# Patient Record
Sex: Female | Born: 1955 | Race: White | Hispanic: No | Marital: Married
Health system: Midwestern US, Community
[De-identification: ages and names within clinical notes are randomized; demographics above are authoritative.]

## PROBLEM LIST (undated history)

## (undated) DIAGNOSIS — F339 Major depressive disorder, recurrent, unspecified: Secondary | ICD-10-CM

## (undated) DIAGNOSIS — G4700 Insomnia, unspecified: Secondary | ICD-10-CM

## (undated) DIAGNOSIS — F419 Anxiety disorder, unspecified: Secondary | ICD-10-CM

## (undated) DIAGNOSIS — D509 Iron deficiency anemia, unspecified: Secondary | ICD-10-CM

## (undated) DIAGNOSIS — G2581 Restless legs syndrome: Secondary | ICD-10-CM

## (undated) DIAGNOSIS — I82409 Acute embolism and thrombosis of unspecified deep veins of unspecified lower extremity: Secondary | ICD-10-CM

## (undated) DIAGNOSIS — M75100 Unspecified rotator cuff tear or rupture of unspecified shoulder, not specified as traumatic: Secondary | ICD-10-CM

## (undated) DIAGNOSIS — Z1231 Encounter for screening mammogram for malignant neoplasm of breast: Secondary | ICD-10-CM

## (undated) DIAGNOSIS — M545 Low back pain, unspecified: Secondary | ICD-10-CM

## (undated) DIAGNOSIS — E785 Hyperlipidemia, unspecified: Secondary | ICD-10-CM

## (undated) DIAGNOSIS — Z01419 Encounter for gynecological examination (general) (routine) without abnormal findings: Secondary | ICD-10-CM

## (undated) DIAGNOSIS — Z1239 Encounter for other screening for malignant neoplasm of breast: Secondary | ICD-10-CM

## (undated) DIAGNOSIS — M549 Dorsalgia, unspecified: Secondary | ICD-10-CM

## (undated) DIAGNOSIS — E569 Vitamin deficiency, unspecified: Secondary | ICD-10-CM

## (undated) DIAGNOSIS — Z9181 History of falling: Secondary | ICD-10-CM

## (undated) DIAGNOSIS — N958 Other specified menopausal and perimenopausal disorders: Secondary | ICD-10-CM

## (undated) DIAGNOSIS — M79671 Pain in right foot: Secondary | ICD-10-CM

## (undated) DIAGNOSIS — S22080K Wedge compression fracture of T11-T12 vertebra, subsequent encounter for fracture with nonunion: Secondary | ICD-10-CM

## (undated) DIAGNOSIS — Z981 Arthrodesis status: Secondary | ICD-10-CM

## (undated) DIAGNOSIS — R079 Chest pain, unspecified: Secondary | ICD-10-CM

## (undated) DIAGNOSIS — E7800 Pure hypercholesterolemia, unspecified: Secondary | ICD-10-CM

## (undated) DIAGNOSIS — I82402 Acute embolism and thrombosis of unspecified deep veins of left lower extremity: Secondary | ICD-10-CM

## (undated) DIAGNOSIS — G8929 Other chronic pain: Secondary | ICD-10-CM

## (undated) DIAGNOSIS — IMO0001 Reserved for inherently not codable concepts without codable children: Secondary | ICD-10-CM

## (undated) DIAGNOSIS — M79604 Pain in right leg: Secondary | ICD-10-CM

## (undated) DIAGNOSIS — K219 Gastro-esophageal reflux disease without esophagitis: Secondary | ICD-10-CM

## (undated) DIAGNOSIS — F413 Other mixed anxiety disorders: Secondary | ICD-10-CM

## (undated) DIAGNOSIS — M5441 Lumbago with sciatica, right side: Secondary | ICD-10-CM

## (undated) DIAGNOSIS — S42211A Unspecified displaced fracture of surgical neck of right humerus, initial encounter for closed fracture: Principal | ICD-10-CM

## (undated) DIAGNOSIS — S20211S Contusion of right front wall of thorax, sequela: Secondary | ICD-10-CM

## (undated) DIAGNOSIS — N6452 Nipple discharge: Secondary | ICD-10-CM

## (undated) DIAGNOSIS — F332 Major depressive disorder, recurrent severe without psychotic features: Principal | ICD-10-CM

## (undated) DIAGNOSIS — R0602 Shortness of breath: Secondary | ICD-10-CM

## (undated) DIAGNOSIS — D649 Anemia, unspecified: Principal | ICD-10-CM

## (undated) DIAGNOSIS — N390 Urinary tract infection, site not specified: Secondary | ICD-10-CM

## (undated) DIAGNOSIS — J40 Bronchitis, not specified as acute or chronic: Secondary | ICD-10-CM

## (undated) DIAGNOSIS — J9601 Acute respiratory failure with hypoxia: Principal | ICD-10-CM

## (undated) DIAGNOSIS — D508 Other iron deficiency anemias: Secondary | ICD-10-CM

---

## 2007-07-26 LAB — CBC WITH AUTOMATED DIFF
ABS. BASOPHILS: 0 10*3/uL (ref 0.0–0.1)
ABS. EOSINOPHILS: 0.2 10*3/uL (ref 0.0–0.4)
ABS. LYMPHOCYTES: 1.5 10*3/uL (ref 0.8–3.5)
ABS. MONOCYTES: 0.4 10*3/uL (ref 0–1.0)
ABS. NEUTROPHILS: 4.5 10*3/uL (ref 1.8–8.0)
BASOPHILS: 0 % (ref 0–1)
EOSINOPHILS: 3 % (ref 0–7)
HCT: 36.4 % (ref 35.0–47.0)
HGB: 11.7 g/dL (ref 11.5–16.0)
LYMPHOCYTES: 23 % (ref 12–49)
MCH: 29.3 PG (ref 26.0–34.0)
MCHC: 32.1 g/dL (ref 30.0–35.0)
MCV: 91 FL (ref 80.0–99.0)
MONOCYTES: 6 % (ref 5–13)
NEUTROPHILS: 68 % (ref 32–75)
PLATELET: 223 10*3/uL (ref 150–400)
RBC: 4 M/uL (ref 3.80–5.20)
RDW: 12.4 % (ref 11.5–14.5)
WBC: 6.6 10*3/uL (ref 3.6–11.0)

## 2007-07-26 LAB — METABOLIC PANEL, COMPREHENSIVE
A-G Ratio: 1.4 (ref 1.1–2.2)
ALT (SGPT): 27 U/L — ABNORMAL LOW (ref 30–65)
AST (SGOT): 17 U/L (ref 15–37)
Albumin: 4 g/dL (ref 3.5–5.0)
Alk. phosphatase: 97 U/L (ref 50–136)
Anion gap: 7 (ref 5–15)
BUN/Creatinine ratio: 15 (ref 12–20)
BUN: 9 MG/DL (ref 6–20)
Bilirubin, total: 0.2 MG/DL (ref 0–1.0)
CO2: 30 MMOL/L (ref 21–32)
Calcium: 8.9 MG/DL (ref 8.5–10.1)
Chloride: 107 MMOL/L (ref 97–108)
Creatinine: 0.6 MG/DL (ref 0.6–1.3)
GFR est AA: >60 mL/min/{1.73_m2} (ref >60)
GFR est non-AA: >60 mL/min/{1.73_m2} (ref >60)
Globulin: 2.8 g/dL (ref 2.0–4.0)
Glucose: 95 MG/DL (ref 65–105)
Potassium: 4.2 MMOL/L (ref 3.5–5.1)
Protein, total: 6.8 g/dL (ref 6.4–8.2)
Sodium: 144 MMOL/L (ref 136–145)

## 2007-07-26 LAB — LIPID PANEL
CHOL/HDL Ratio: 2.6 (ref 0–5.0)
Cholesterol, total: 206 MG/DL — ABNORMAL HIGH (ref <200)
HDL Cholesterol: 78 MG/DL — ABNORMAL HIGH (ref 40–60)
LDL, calculated: 109.6 MG/DL — ABNORMAL HIGH (ref 0–100)
Triglyceride: 92 MG/DL (ref 30–200)
VLDL, calculated: 18.4 MG/DL

## 2007-07-26 LAB — TSH 3RD GENERATION: TSH: 1.37 u[IU]/mL (ref 0.35–5.5)

## 2007-11-10 NOTE — Progress Notes (Signed)
Quick Note:    Please notify her of her negative cxr  ______

## 2007-11-10 NOTE — Progress Notes (Signed)
HISTORY OF PRESENT ILLNESS  Lindsay [name] is a 52 y.o. female.  HPI    Seen for a constellation of sxs. Notes she was using a tractor and felt a right chest wall pulliing sensation 4/18th-developed a focal sore area in right anterior chest and went to [facility] on 4/23 where an ekg and cxr were negative and she was told she had a muscle pull. The discomfort persists although to a lesser degree but now she feels pain on her right neck and upper back and wonders if the fact that she stopped going to PT has made this worse. She was getting heat treatment to upper back during pt. Also notes she stopped her lexapro 3 days ago and feels funny in her head now and has more diffuse aches and pains. No fevers, on visual sxs and no HA.        REVIEW OF SYSTEMS  Review of Systems   Constitutional: Negative for fever and chills.   HENT: Positive for neck pain.    Eyes: Negative for blurred vision and double vision.   Respiratory: Negative for shortness of breath.    Cardiovascular: Positive for chest pain. Negative for palpitations and leg swelling.   Musculoskeletal: Positive for myalgias and back pain. Negative for joint pain.   Skin: Negative for rash.   Neurological: Negative for dizziness, tremors, sensory change, focal weakness and headaches.       PHYSICAL EXAM  Physical Exam   Nursing note and vitals reviewed.  Constitutional: She is oriented. She appears well-developed and well-nourished.   HENT:   Head: Normocephalic and atraumatic.   Neck: Normal range of motion. Neck supple. Carotid bruit is not present. No thyromegaly present.   Cardiovascular: Normal rate, regular rhythm, S1 normal, S2 normal, normal heart sounds and intact distal pulses.    No murmur heard.  Pulmonary/Chest: Effort normal and breath sounds normal. No respiratory distress. She has no wheezes. She has no rales.   Genitourinary:        Breast exam s/p implants,no lumps or areas to suggest rupture of the implant    Musculoskeletal: She exhibits no edema.        Tender over right trapezius muscle with palpable spasm  Mildly tender over right anterior chest wall with mild focal soft tissue swelling around the size of a 1/2 dollar.   Neurological: She is alert and oriented.   Psychiatric: She has a normal mood and affect. Her behavior is normal.       ASSESSMENT and PLAN  Lindsay [name] was seen today for chest pain, leg pain, neck pain and headache.    Diagnoses and associated orders for this visit:    - Chest pain-consistent with chest wall pain-advil q 6 hours for 1 week  - Xr chest pa and lateral -- Future-to r/o any bony lesions of ribs    - Trapezius strain    -Informed consent obtained and under sterile conditions right trapezius muscle was prepped and draped and injected with 1 cc of depomedrol and lidocaine in 2 separate trigger points (1/2 cc in each).  Tolerated well without complications.  Patient instructed to keep area clean and return if any signs of infection develop.  Suspect serotonin discontinuation-resume 1/2 a lexapro qd  Appt in 3 weeks if not better      -

## 2007-11-13 NOTE — Telephone Encounter (Signed)
Patient notified by phone cxr looked fine.

## 2007-11-13 NOTE — Telephone Encounter (Signed)
Message copied by Edgardo Roys on Mon Nov 13, 2007 9:31 AM  ------   Message from: Clarene Critchley   Created: Fri Nov 10, 2007 4:20 PM     Please notify her of her negative cxr

## 2007-12-27 ENCOUNTER — Ambulatory Visit

## 2007-12-27 LAB — CBC WITH AUTOMATED DIFF
ABS. BASOPHILS: 0 10*3/uL (ref 0.0–0.1)
ABS. EOSINOPHILS: 0.1 10*3/uL (ref 0.0–0.4)
ABS. LYMPHOCYTES: 1.6 10*3/uL (ref 0.8–3.5)
ABS. MONOCYTES: 0.4 10*3/uL (ref 0–1.0)
ABS. NEUTROPHILS: 3.1 10*3/uL (ref 1.8–8.0)
BASOPHILS: 0 % (ref 0–1)
EOSINOPHILS: 1 % (ref 0–7)
HCT: 37.1 % (ref 35.0–47.0)
HGB: 12.1 g/dL (ref 11.5–16.0)
LYMPHOCYTES: 31 % (ref 12–49)
MCH: 30.2 PG (ref 26.0–34.0)
MCHC: 32.6 g/dL (ref 30.0–35.0)
MCV: 92.5 FL (ref 80.0–99.0)
MONOCYTES: 7 % (ref 5–13)
NEUTROPHILS: 61 % (ref 32–75)
PLATELET: 225 10*3/uL (ref 150–400)
RBC: 4.01 M/uL (ref 3.80–5.20)
RDW: 13.2 % (ref 11.5–14.5)
WBC: 5.2 10*3/uL (ref 3.6–11.0)

## 2007-12-27 LAB — TSH 3RD GENERATION: TSH: 0.99 u[IU]/mL (ref 0.35–5.5)

## 2007-12-27 NOTE — Progress Notes (Signed)
HISTORY OF PRESENT ILLNESS  Lindsay [name] is a 52 y.o. female.  HPI       Notes chest pain did improve  But notes some pain on the right side of head and some headache and ear and jaw discomfort . She denies fevers or chills. She is on 1 lexapro 10 mg qd and notes some fatigue. Feels she sleeps well. Using metamucil since the fall for constipation and notes in last few months some discomfort in rectal area and using topical cortisone cream. Sees some blood in stool recently. No recent gi eval. Notes fatigue.    Review of Systems   Constitutional: Negative for fever, chills and weight loss.   HENT: Positive for neck pain. Negative for ear pain and nosebleeds.    Respiratory: Negative for cough, shortness of breath and wheezing.    Cardiovascular: Negative for chest pain, palpitations, orthopnea, leg swelling and PND.   Gastrointestinal: Positive for constipation and blood in stool. Negative for heartburn, nausea and diarrhea.   Musculoskeletal: Negative for myalgias.   Neurological: Positive for headaches. Negative for dizziness.       Physical Exam   Nursing note and vitals reviewed.  Constitutional: She is oriented. She appears well-developed and well-nourished.   HENT:   Head: Normocephalic and atraumatic.        Mildly tender in tmj area   Neck: Normal range of motion. Neck supple. Carotid bruit is not present. No thyromegaly present.   Cardiovascular: Normal rate, regular rhythm, S1 normal, S2 normal, normal heart sounds and intact distal pulses.    No murmur heard.  Pulmonary/Chest: Effort normal and breath sounds normal. No respiratory distress. She has no wheezes. She has no rales.   Musculoskeletal: She exhibits no edema.   Neurological: She is alert and oriented.   Psychiatric: She has a normal mood and affect. Her behavior is normal.       ASSESSMENT and PLAN  Lindsay [name] was seen today for depression.    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

## 2007-12-30 NOTE — Progress Notes (Signed)
Quick Note:    Notified by letter.    ______

## 2008-01-17 NOTE — Telephone Encounter (Signed)
Lab slip completed and placed up front

## 2008-01-17 NOTE — Telephone Encounter (Signed)
Needs a lab order for her chemistry panel will pickup Thursday am 01/18/08.She received a letter from [facility] to have this test done

## 2008-01-18 LAB — METABOLIC PANEL, COMPREHENSIVE
A-G Ratio: 1.5 (ref 1.1–2.2)
ALT (SGPT): 29 U/L — ABNORMAL LOW (ref 30–65)
AST (SGOT): 17 U/L (ref 15–37)
Albumin: 4.1 g/dL (ref 3.5–5.0)
Alk. phosphatase: 85 U/L (ref 50–136)
Anion gap: 8 mmol/L (ref 5–15)
BUN/Creatinine ratio: 22 — ABNORMAL HIGH (ref 12–20)
BUN: 13 MG/DL (ref 6–20)
Bilirubin, total: 0.4 MG/DL (ref <1.0)
CO2: 31 MMOL/L (ref 21–32)
Calcium: 9.2 MG/DL (ref 8.5–10.1)
Chloride: 102 MMOL/L (ref 97–108)
Creatinine: 0.6 MG/DL (ref 0.6–1.3)
GFR est AA: >60 mL/min/{1.73_m2} (ref >60)
GFR est non-AA: >60 mL/min/{1.73_m2} (ref >60)
Globulin: 2.8 g/dL (ref 2.0–4.0)
Glucose: 87 MG/DL (ref 50–100)
Potassium: 4.3 MMOL/L (ref 3.5–5.1)
Protein, total: 6.9 g/dL (ref 6.4–8.2)
Sodium: 141 MMOL/L (ref 136–145)

## 2008-01-18 LAB — TSH 3RD GENERATION: TSH: 1.4 u[IU]/mL (ref 0.35–5.5)

## 2008-01-19 NOTE — Progress Notes (Signed)
Quick Note:    Notified by letter.    ______

## 2008-03-18 MED ORDER — DIAZEPAM 5 MG TAB
5 mg | ORAL_TABLET | Freq: Two times a day (BID) | ORAL | Status: DC
Start: 2008-03-18 — End: 2008-09-13

## 2008-09-13 NOTE — Telephone Encounter (Signed)
Advised needs appt.

## 2008-09-16 MED ORDER — DIAZEPAM 5 MG TAB
5 mg | ORAL_TABLET | Freq: Two times a day (BID) | ORAL | Status: DC
Start: 2008-09-16 — End: 2008-09-26

## 2008-09-16 MED ORDER — DIAZEPAM 5 MG TAB
5 mg | ORAL_TABLET | Freq: Two times a day (BID) | ORAL | Status: DC
Start: 2008-09-16 — End: 2008-09-16

## 2008-09-16 NOTE — Telephone Encounter (Addendum)
Addended by: Orva Gwaltney L on: 09/16/2008      Modules accepted: Orders

## 2008-09-16 NOTE — Telephone Encounter (Addendum)
Can you call this in for her -thanks

## 2008-09-17 NOTE — Telephone Encounter (Signed)
This was already done

## 2008-09-26 MED ORDER — DIAZEPAM 5 MG TAB
5 mg | ORAL_TABLET | Freq: Two times a day (BID) | ORAL | Status: AC
Start: 2008-09-26 — End: 2008-10-26

## 2008-09-26 NOTE — Progress Notes (Signed)
HISTORY OF PRESENT ILLNESS  Lindsay [name] is a 53 y.o. female.  HPI  Lindsay [name] seen in six months follow up.    Issues:    1. Anxiety.  She has been working with her psychiatrist and is on Lexapro.  He suggested she bump from 10 to 15 mg dose but she was overly sedated with it.  She feels that although less anxious she is not as energized and not as motivated as when she was off of it, wants my opinion.  2. Left foot injury.  Larey Seat off a ladder, has a 2 cm tear in a tendon in the bottom of her foot.  Is working with Dr. Sharlet Salina from [facility] in a boot but anticipating surgery and will have pre-op EKG tomorrow.  3. Weight gain.  Twenty-five pounds in the last year and a half. She is working with a nutritionist who has been encouraging her to eat more regularly, previously skipped meals.    4. Valium which I have been filling, she has been using a total of a 5 mg tab daily although cuts them in half and doses bid and has been very stable with this.  Has never escalated usage.        Review of Systems   Constitutional: Negative for fever, chills and weight loss.   Respiratory: Negative for cough, shortness of breath and wheezing.    Cardiovascular: Negative for chest pain, palpitations, orthopnea, leg swelling and PND.   Gastrointestinal: Negative for heartburn and nausea.   Musculoskeletal: Positive for joint pain (foot) and falls. Negative for myalgias.   Neurological: Negative for dizziness and headaches.   Psychiatric/Behavioral: Negative for depression. The patient is not nervous/anxious and does not have insomnia.        Physical Exam   Nursing note and vitals reviewed.  Constitutional: She is oriented. She appears well-developed and well-nourished.   HENT:   Head: Normocephalic and atraumatic.   Neck: Normal range of motion. Neck supple. Carotid bruit is not present. No thyromegaly present.    Cardiovascular: Normal rate, regular rhythm, S1 normal, S2 normal, normal heart sounds and intact distal pulses.    No murmur heard.  Pulmonary/Chest: Effort normal and breath sounds normal. No respiratory distress. She has no wheezes. She has no rales.   Musculoskeletal: She exhibits no edema.   Neurological: She is alert and oriented.   Psychiatric: She has a normal mood and affect. Her behavior is normal.       ASSESSMENT and PLAN  Lindsay [name] was seen today for follow-up, depression and other.    Diagnoses and associated orders for this visit:    Foot injury-planning surgery    Anxiety-she will review a decrease in lexapro dose and possibly adding wellbutrin with her psychiatrist  - diazepam (VALIUM) tablet; Take 1 Tab by mouth two (2) times a day for 30 days. 1/2 bid    Weight gain-reviewed dietary measures    Side effects possible reviewed in detail

## 2008-10-09 NOTE — Telephone Encounter (Signed)
Patient advised DR. Darl Pikes not here, would need to see partner for med's or try calling foot surgeon, she tried calling them they want her to get med advice from PCP, patient will continue bid valium 1/2 tab for now if it gets worse she will call back for appt no hx on chart for restless leg, patient just thinks it's from being cooped up in the house and not being able to get up.

## 2008-10-09 NOTE — Telephone Encounter (Signed)
Pt had foot surgery on 29th and has to stay off for another week or more  is taking 5mg  diazapam  She is going crazy with something like restless leg and needs advice on how to cope  increase diazapam or something else dr would suggest call [phone]

## 2008-10-31 LAB — METABOLIC PANEL, COMPREHENSIVE
A-G Ratio: 1 — ABNORMAL LOW (ref 1.1–2.2)
ALT (SGPT): 40 U/L (ref 12–78)
AST (SGOT): 23 U/L (ref 15–37)
Albumin: 4.1 g/dL (ref 3.5–5.0)
Alk. phosphatase: 158 U/L — ABNORMAL HIGH (ref 50–136)
Anion gap: 8 mmol/L (ref 5–15)
BUN/Creatinine ratio: 14 (ref 12–20)
BUN: 10 MG/DL (ref 6–20)
Bilirubin, total: 0.3 MG/DL (ref 0.2–1.0)
CO2: 31 MMOL/L (ref 21–32)
Calcium: 10.2 MG/DL — ABNORMAL HIGH (ref 8.5–10.1)
Chloride: 98 MMOL/L (ref 97–108)
Creatinine: 0.7 MG/DL (ref 0.6–1.3)
GFR est AA: >60 mL/min/{1.73_m2} (ref >60)
GFR est non-AA: >60 mL/min/{1.73_m2} (ref >60)
Globulin: 4.1 g/dL — ABNORMAL HIGH (ref 2.0–4.0)
Glucose: 99 MG/DL (ref 65–100)
Potassium: 4 MMOL/L (ref 3.5–5.1)
Protein, total: 8.2 g/dL (ref 6.4–8.2)
Sodium: 137 MMOL/L (ref 136–145)

## 2008-10-31 LAB — CK W/ REFLX CKMB: CK: 76 U/L (ref 21–215)

## 2008-10-31 LAB — CBC WITH AUTOMATED DIFF
ABS. BASOPHILS: 0 10*3/uL (ref 0.0–0.1)
ABS. EOSINOPHILS: 0.2 10*3/uL (ref 0.0–0.4)
ABS. LYMPHOCYTES: 1.4 10*3/uL (ref 0.8–3.5)
ABS. MONOCYTES: 0.5 10*3/uL (ref 0.0–1.0)
ABS. NEUTROPHILS: 5.1 10*3/uL (ref 1.8–8.0)
BASOPHILS: 0 % (ref 0–1)
EOSINOPHILS: 3 % (ref 0–7)
HCT: 38.8 % (ref 35.0–47.0)
HGB: 12.9 g/dL (ref 11.5–16.0)
LYMPHOCYTES: 19 % (ref 12–49)
MCH: 28.2 PG (ref 26.0–34.0)
MCHC: 33.2 g/dL (ref 30.0–36.5)
MCV: 84.9 FL (ref 80.0–99.0)
MONOCYTES: 7 % (ref 5–13)
NEUTROPHILS: 71 % (ref 32–75)
PLATELET: 205 10*3/uL (ref 150–400)
RBC: 4.57 M/uL (ref 3.80–5.20)
RDW: 13.2 % (ref 11.5–14.5)
WBC: 7.3 10*3/uL (ref 3.6–11.0)

## 2008-10-31 LAB — NT-PRO BNP: NT pro-BNP: 35 PG/ML (ref 0–125)

## 2008-10-31 LAB — TROPONIN I: Troponin-I, Qt.: <0.04 ng/mL (ref <0.05)

## 2008-10-31 MED ORDER — ALBUTEROL 90 MCG/ACTUATION AEROSOL INHALER
90 mcg/actuation | RESPIRATORY_TRACT | Status: DC | PRN
Start: 2008-10-31 — End: 2009-05-21

## 2008-10-31 MED ADMIN — ioversol (OPTIRAY) 350 mg/mL contrast solution 100 mL: INTRAVENOUS | @ 20:00:00 | NDC 00019133311

## 2008-10-31 MED ADMIN — 0.9% sodium chloride infusion: INTRAVENOUS | @ 21:00:00 | NDC 00409798309

## 2008-10-31 MED FILL — SALINE FLUSH INJECTION SYRINGE: INTRAMUSCULAR | Qty: 10

## 2008-10-31 MED FILL — SODIUM CHLORIDE 0.9 % IV: INTRAVENOUS | Qty: 1000

## 2008-10-31 MED FILL — OPTIRAY 350 MG IODINE/ML INTRAVENOUS SOLUTION: 350 mg iodine/mL | INTRAVENOUS | Qty: 100

## 2008-10-31 NOTE — ED Notes (Signed)
I have reviewed discharge instructions with the patient.  The patient verbalized understanding. Pt d/c with husband pt using her crutches .

## 2008-10-31 NOTE — ED Notes (Signed)
Negative CTA for PE. Will D/C.

## 2008-10-31 NOTE — ED Provider Notes (Signed)
Patient is a 53 y.o. female presenting with chest pain. The history is provided by the patient.   Chest Pain (Angina)   This is a new problem. The problem has not changed since onset. The pain is associated with coughing. Pain location: mid chest.  The pain is mild. The quality of the pain is described as sharp and stabbing ("Funny feeling"). The pain does not radiate. Associated symptoms include cough. Pertinent negatives include no diaphoresis, no fever, no palpitations, no nausea, no vomiting, no back pain, no dizziness and no weakness. She has tried nothing for the symptoms. Risk factors include no risk factors. Her past medical history does not include aneurysm, DM, DVT, HTN or PE.      She had surgery to repair a tendon on her L foot 30 days ago. She has been laying flat on her back, resting since the surgery. Earlier in the week she began with a cough and cold sxs and when the chest pain began several days ago, she assumed it was related to recent cold. Her chest pain is worsened with cough.     She notes that her cough produces phlegm but no blood.   She was seen at [facility] just [facility] and sent to the ER for further evaluation via CT to r/o PE.  She arrives to the ED with EKG and Chest xray from [facility]. She reports that lab work was done but did not bring paperwork with her. Pt notes that when she left [facility] to come to the ED, she felt as if her heart were racing. She no longer has this sensation.    Patient is otherwise healthy  Exposure to illness includes her son and daughter who had what she initially thought were seasonal allergies.  No other complaints at this time.       No hx of DVT.    No past medical history on file.     Past Surgical History   Procedure Date   ??? Hx other surgical      left foot            No family history on file.     History   Social History   ??? Marital Status: Married     Spouse Name: N/A     Number of Children: N/A   ??? Years of Education: N/A    Occupational History   ??? Not on file.   Social History Main Topics   ??? Tobacco Use: Never   ??? Alcohol Use: No   ??? Drug Use: No   ??? Sexually Active: Yes -- Female partner(s)   Other Topics Concern   ??? Not on file   Social History Narrative   ??? No narrative on file           ALLERGIES: Review of patient's allergies indicates no known allergies.      Review of Systems   Constitutional: Negative for fever, chills, diaphoresis and fatigue.   Respiratory: Positive for cough.    Cardiovascular: Positive for chest pain. Negative for palpitations and leg swelling.   Gastrointestinal: Negative.  Negative for nausea and vomiting.   Genitourinary: Negative.    Musculoskeletal: Negative for back pain and joint swelling.   Neurological: Negative for dizziness, syncope and weakness.       Filed Vitals:    10/31/2008  2:45 PM   BP: 129/73   Pulse: 103   Temp: 98.6 ??F (37 ??C)   Resp: 18  Height: 5\' 3"  (1.6 m)   Weight: 150 lb (68.04 kg)   SpO2: 96%              Physical Exam   Constitutional: She is oriented. She appears well-developed and well-nourished. No distress.   HENT:   Head: Normocephalic and atraumatic.   Mouth/Throat: Oropharynx is clear and moist.   Eyes: Conjunctivae and extraocular motions are normal. Pupils are equal, round, and reactive to light. No scleral icterus.   Neck: Normal range of motion. Neck supple.   Cardiovascular: Normal rate and regular rhythm.  Exam reveals no gallop and no friction rub.    No murmur heard.  Pulmonary/Chest: Effort normal. No stridor. No respiratory distress. She has no wheezes. She has no rales.   Abdominal: Soft. Bowel sounds are normal. She exhibits no distension and no mass. No tenderness. She has no rebound and no guarding.   Musculoskeletal: Normal range of motion. She exhibits no edema and no tenderness.        No calf tenderness.    Lymphadenopathy:     She has no cervical adenopathy.   Neurological: She is alert and oriented. No cranial nerve deficit. Coordination normal.    Skin: Skin is warm and dry. No rash noted. No erythema.        Surgical dressing over L foot.    Psychiatric: She has a normal mood and affect.            Coding      Procedures

## 2008-10-31 NOTE — ED Notes (Signed)
Pt to ct

## 2008-11-01 LAB — EKG, 12 LEAD, INITIAL
Atrial Rate: 79 {beats}/min
Calculated P Axis: 47 degrees
Calculated R Axis: 61 degrees
Calculated T Axis: 58 degrees
Diagnosis: NORMAL
P-R Interval: 138 ms
Q-T Interval: 386 ms
QRS Duration: 84 ms
QTC Calculation (Bezet): 442 ms
Ventricular Rate: 79 {beats}/min

## 2009-04-01 MED ORDER — DIAZEPAM 10 MG TAB
10 mg | ORAL_TABLET | Freq: Four times a day (QID) | ORAL | Status: DC | PRN
Start: 2009-04-01 — End: 2009-05-21

## 2009-04-01 NOTE — Progress Notes (Signed)
HISTORY OF PRESENT ILLNESS  Lindsay [name] is a 53 y.o. female.  HPI  Lindsay [name] comes in at follow up.  Issues:    1. Depression.  She has been working with Dr. Salley [name] because of fatigue on Lexapro.  She has tried Pristiq 50 mg daily for the last month.  She does not like it, feels more depressed and is thinking she will come off of it but plans to try one more month.  Also has gone to see a psychologist for evaluation of possible ADHD, as her daughter has this diagnosis.  2. Rectal bleeding with known hemorrhoids.  Dr. Antonieta [name] has treated her with creams.  This has improved.  3. Abnormal Pap smear.  Being followed by Dr. Burnett [name], now with every six-month follow up Pap.  Most recent has been normal.  She continues to use Valium 10 mg half tab bid and has been stable on this dose, and I have continued to refill it for her.  She is also due for full labs.      MedDATA/lmm        Review of Systems   Constitutional: Negative for fever, chills and weight loss.   Respiratory: Negative for cough, shortness of breath and wheezing.    Cardiovascular: Negative for chest pain, palpitations, orthopnea, leg swelling and PND.   Gastrointestinal: Positive for blood in stool. Negative for heartburn and nausea.   Musculoskeletal: Negative for myalgias.   Neurological: Negative for dizziness and headaches.   Psychiatric/Behavioral: Positive for depression. The patient is nervous/anxious. The patient does not have insomnia.        Physical Exam   Nursing note and vitals reviewed.  Constitutional: She is oriented to person, place, and time. She appears well-developed and well-nourished.   HENT:   Head: Normocephalic and atraumatic.   Neck: Normal range of motion. Neck supple. Carotid bruit is not present. No thyromegaly present.   Cardiovascular: Normal rate, regular rhythm, S1 normal, S2 normal, normal heart sounds and intact distal pulses.    No murmur heard.   Pulmonary/Chest: Effort normal and breath sounds normal. No respiratory distress. She has no wheezes. She has no rales.   Musculoskeletal: She exhibits no edema.   Neurological: She is alert and oriented to person, place, and time.   Psychiatric: She has a normal mood and affect. Her behavior is normal.       ASSESSMENT and PLAN  Lindsay [name] was seen today for follow-up and anxiety.    Diagnoses and associated orders for this visit:    Hypercholesterolemia  - LIPID PANEL; Future  - METABOLIC PANEL, COMPREHENSIVE; Future    Fatigue  - CBC WITH AUTOMATED DIFF; Future  - TSH, 3RD GENERATION; Future    Depression-cont to see Dr Lindsay [name] who is working with her and currently using pristiq-may go back to lexapro  appt here in 6 mo sooner prn  - diazepam (VALIUM) 10 mg tablet; Take 0.5 Tabs by mouth every six (6) hours as needed.    Other Orders  - Desvenlafaxine SR (PRISTIQ) 50 mg tablet; Take 50 mg by mouth daily.

## 2009-04-02 ENCOUNTER — Ambulatory Visit

## 2009-04-02 LAB — LIPID PANEL
CHOL/HDL Ratio: 3.1 (ref 0–5.0)
Cholesterol, total: 232 MG/DL — ABNORMAL HIGH (ref <200)
HDL Cholesterol: 75 MG/DL
LDL, calculated: 112.6 MG/DL — ABNORMAL HIGH (ref 0–100)
Triglyceride: 222 MG/DL — ABNORMAL HIGH (ref <150)
VLDL, calculated: 44.4 MG/DL

## 2009-04-02 LAB — METABOLIC PANEL, COMPREHENSIVE
A-G Ratio: 1.3 (ref 1.1–2.2)
ALT (SGPT): 17 U/L (ref 12–78)
AST (SGOT): 14 U/L — ABNORMAL LOW (ref 15–37)
Albumin: 4.2 g/dL (ref 3.5–5.0)
Alk. phosphatase: 129 U/L (ref 50–136)
Anion gap: 11 mmol/L (ref 5–15)
BUN/Creatinine ratio: 19 (ref 12–20)
BUN: 15 MG/DL (ref 6–20)
Bilirubin, total: 0.3 MG/DL (ref 0.2–1.0)
CO2: 26 MMOL/L (ref 21–32)
Calcium: 9.3 MG/DL (ref 8.5–10.1)
Chloride: 102 MMOL/L (ref 97–108)
Creatinine: 0.8 MG/DL (ref 0.6–1.3)
GFR est AA: >60 mL/min/{1.73_m2} (ref >60)
GFR est non-AA: >60 mL/min/{1.73_m2} (ref >60)
Globulin: 3.3 g/dL (ref 2.0–4.0)
Glucose: 83 MG/DL (ref 65–100)
Potassium: 4 MMOL/L (ref 3.5–5.1)
Protein, total: 7.5 g/dL (ref 6.4–8.2)
Sodium: 139 MMOL/L (ref 136–145)

## 2009-04-02 LAB — CBC WITH AUTOMATED DIFF
ABS. BASOPHILS: 0 10*3/uL (ref 0.0–0.1)
ABS. EOSINOPHILS: 0.1 10*3/uL (ref 0.0–0.4)
ABS. LYMPHOCYTES: 2.2 10*3/uL (ref 0.8–3.5)
ABS. MONOCYTES: 0.4 10*3/uL (ref 0.0–1.0)
ABS. NEUTROPHILS: 3.8 10*3/uL (ref 1.8–8.0)
BASOPHILS: 0 % (ref 0–1)
EOSINOPHILS: 2 % (ref 0–7)
HCT: 39.7 % (ref 35.0–47.0)
HGB: 12.4 g/dL (ref 11.5–16.0)
LYMPHOCYTES: 34 % (ref 12–49)
MCH: 27.4 PG (ref 26.0–34.0)
MCHC: 31.2 g/dL (ref 30.0–36.5)
MCV: 87.8 FL (ref 80.0–99.0)
MONOCYTES: 7 % (ref 5–13)
NEUTROPHILS: 57 % (ref 32–75)
PLATELET: 264 10*3/uL (ref 150–400)
RBC: 4.52 M/uL (ref 3.80–5.20)
RDW: 14.1 % (ref 11.5–14.5)
WBC: 6.5 10*3/uL (ref 3.6–11.0)

## 2009-04-02 LAB — TSH 3RD GENERATION: TSH: 1.65 u[IU]/mL (ref 0.36–3.74)

## 2009-04-02 NOTE — Progress Notes (Signed)
Quick Note:    Notified by letter.    ______

## 2009-04-18 MED ORDER — DIAZEPAM 5 MG TAB
5 mg | ORAL_TABLET | ORAL | Status: DC
Start: 2009-04-18 — End: 2010-03-26

## 2009-05-21 NOTE — Telephone Encounter (Signed)
Notes right jaw claudication and pain in temporal area started 4 days ago-[facility] Dr Melvyn Neth eye exam-advised I will work her in here today

## 2009-05-21 NOTE — Progress Notes (Signed)
Faxed MR request to [facility] for visit on 05/21/2009.

## 2009-05-21 NOTE — Progress Notes (Signed)
HISTORY OF PRESENT ILLNESS  Lindsay [name] is a 53 y.o. female.  HPI  Sat developed pain in right side of jaw and extended to ear and neck and Sunday developed headache and felt terrible. Monday  She started vyvanse from her psychiatrist and that night felt exhausted and persistent pain and some pain with eating. Went to [facility] on Tuesday and had a sed rate of 20 and because of some right eye pain she was sent to Dr Lenon Ahmadi who today did an eye exam and did not find any problems but is worried re Temporal arteritis and asked her to come see me for further exam.She had a dose of 60 mg of prednisone last night and does have a rx from [facility] for prednisone 20 mg 3 tablets daily but has not filled it yet.  Review of Systems   Constitutional: Negative for fever, chills and diaphoresis.   HENT: Negative for congestion.    Eyes: Positive for pain (right eye). Negative for blurred vision, double vision, discharge and redness.   Neurological: Positive for headaches.       Physical Exam   Nursing note and vitals reviewed.  Constitutional: She appears well-developed and well-nourished.   HENT:   Head: Normocephalic.   Right Ear: Tympanic membrane, external ear and ear canal normal.   Left Ear: Tympanic membrane, external ear and ear canal normal.   Nose: Nose normal.   Mouth/Throat: Oropharynx is clear and moist and mucous membranes are normal. No oropharyngeal exudate.        No pain in temporal area but tender at tmj region and extending down to angle of jaw and pain with opening jaw   Eyes: Conjunctivae are normal. Pupils are equal, round, and reactive to light. Right eye exhibits no discharge. Left eye exhibits no discharge.        Pupils dilated bilaterally-recent eye exam with Dr Lenon Ahmadi   Neck: Normal range of motion. Neck supple.   Cardiovascular: Normal rate, regular rhythm and normal heart sounds.     Pulmonary/Chest: Effort normal and breath sounds normal. No respiratory distress. She has no wheezes. She has no rales.   Lymphadenopathy:     She has no cervical adenopathy.       ASSESSMENT and PLAN  Zonia was seen today for jaw pain.    Diagnoses and associated orders for this visit:    Jaw claudication-? Of temporal arteritis has been raised despite normal esr she has sxs that prompted start of prednisone 60 mg every day-spoke with Dr Sheliah Hatch vascular who kindly agrees to see her for a temporal artery biopsy -she will cont the prednisone until this is obtained-reviewed side effects of the med-reviewed risks of not treating temporal arteritis in terms of loss of vision-she understands need to further define the diagnosis-this may all be from tmj area.  Other Orders  - Lisdexamfetamine (VYVANSE) 30 mg capsule; Take 30 mg by mouth every morning.  Over 50% of  Visit of 30 minutes spent reviewing diagnosis and treatment options and side effects of medicines.

## 2009-05-24 NOTE — Op Note (Signed)
Op  Notes filed by Adah Salvage, MD at 06/12/09 1006                Author: Adah Salvage, MD  Service: --  Author Type: Physician       Filed: 06/12/09 1006  Date of Service: 05/24/09 0024  Status: Addendum          Editor: Adah Salvage, MD (Physician)          <!--EPICS--> Name:      Lindsay [name], Lindsay [name]<BR> MR #:      [id number]                    Surgeon:        Alvina Filbert, MD<BR> Account #: [id number]                  Surgery Date:   05/23/2009<BR> DOB:       1955/11/04<BR> Age:       53                           Location:<BR> <BR>                              OPERATIVE REPORT<BR> <BR> <BR> PREOPERATIVE DIAGNOSIS<BR> <BR> REASON FOR PROCEDURE:  Right  temporal arteritis.<BR> <BR> POSTOPERATIVE DIAGNOSIS:  Right temporal arteritis.<BR> <BR> OPERATIVE PROCEDURE:  Temporal artery biopsy.<BR> <BR> SURGEON:  Vernia Buff T. Sheliah Hatch, MD<BR> <BR> ANESTHESIA:  IV sedation, local anesthesia.<BR> <BR> COMPLICATIONS<BR>  <BR> ESTIMATED BLOOD LOSS<BR> <BR> INDICATIONS FOR PROCEDURE:  The patient is [name] with<BR> temporal arteritis on the right side. Decision was made to perform [name]<BR> biopsy.<BR> <BR> PROCEDURE<BR> TECHNICAL DETAILS:  She was taken to the  operating room and [name] suitable<BR> level of anesthesia was induced. She was prepped and draped in typical<BR> sterile fashion. Oblique incision was made in the right temple, dissection<BR> carried out down the artery ________ soft tissue attachments proximally  and<BR> distally. The artery was controlled proximally and distally _______ sent to<BR> pathology for permanent section.<BR> <BR> Hemostasis was controlled. The wound was closed in multiple layers.<BR> Tolerated the procedure well. Sponge and instrument  counts were correct x2.<BR> <BR> <BR> She was awakened and transferred to recovery room in good condition.<BR> <BR> <BR> <BR> Reviewed on 05/26/2009 8:30 AM<BR> <BR> <BR> E-Signed By<BR> Alvina Filbert, MD 06/12/2009 10:06<BR> Alvina Filbert, MD<BR> <BR>  cc:    Alvina Filbert, MD<BR> <BR> <BR> <BR> MW/WMX; D: 05/23/2009  5:01 P; T: 05/24/2009 12:24 [name]; Doc# [id number]; Job#<BR> [id number]<UY> <!--EPICE-->

## 2009-05-24 NOTE — Op Note (Addendum)
Name: Lindsay [name], Lindsay [name]  MR #: [id number] Surgeon: Alvina Filbert, MD  Account #: [id number] Surgery Date: 05/23/2009  DOB: Apr 15, 1956  Age: 53 Location:     OPERATIVE REPORT      PREOPERATIVE DIAGNOSIS    REASON FOR PROCEDURE: Right temporal arteritis.    POSTOPERATIVE DIAGNOSIS: Right temporal arteritis.    OPERATIVE PROCEDURE: Temporal artery biopsy.    SURGEON: Vernia Buff T. Sheliah Hatch, MD    ANESTHESIA: IV sedation, local anesthesia.    COMPLICATIONS    ESTIMATED BLOOD LOSS    INDICATIONS FOR PROCEDURE: The patient is a 53 year old female with  temporal arteritis on the right side. Decision was made to perform a  biopsy.    PROCEDURE  TECHNICAL DETAILS: She was taken to the operating room and a suitable  level of anesthesia was induced. She was prepped and draped in typical  sterile fashion. Oblique incision was made in the right temple, dissection  carried out down the artery ________ soft tissue attachments proximally and  distally. The artery was controlled proximally and distally _______ sent to  pathology for permanent section.    Hemostasis was controlled. The wound was closed in multiple layers.  Tolerated the procedure well. Sponge and instrument counts were correct x2.      She was awakened and transferred to recovery room in good condition.        Reviewed on 05/26/2009 8:30 AM      E-Signed By  Alvina Filbert, MD 06/12/2009 10:06  Alvina Filbert, MD    cc: Alvina Filbert, MD        MW/WMX; D: 05/23/2009 5:01 P; T: 05/24/2009 12:24 A; Doc# [id number]; Job#  [id number]

## 2009-05-27 MED ORDER — PREDNISONE 20 MG TAB
20 mg | ORAL_TABLET | Freq: Every day | ORAL | Status: DC
Start: 2009-05-27 — End: 2009-06-05

## 2009-05-27 NOTE — Telephone Encounter (Signed)
[facility] 05-27-09 12:00 mhouchen[address] Dr Darl Pikes pt dob [birth year] pt requesting a return call regarding taking steroids from a surgical procedure, pt need to know if she should call Pt 1st to get refills, pt only has enough medication for one more day. Pt uses Daphine Deutscher @ [phone] best number to reach pt (c) [phone]

## 2009-05-27 NOTE — Telephone Encounter (Signed)
I called Dr Vedia Pereyra office-no path report back yet on the temporal biopsy-Dr Mayford Knife at [facility] pathology is looking for the report for me-spoke with Margolis is feeling ok-will dec to 40 mg prednisone and appt next week-if I can get the path report back before the holiday will call her -hopefully neg biopsy and could stop the prednisone

## 2009-05-27 NOTE — Telephone Encounter (Signed)
Dr. Darl Pikes please advise -

## 2009-05-28 NOTE — Telephone Encounter (Signed)
Dr Mayford Knife from pathology calls to notify me that the temporal artery biopsy is negative so no sign of arteritis-left a message for patient to this effect-can taper off the prednisone -40 mg today and then 20 mg every day for 3 days and then stp and appt next week

## 2009-06-05 MED ORDER — METAXALONE 800 MG TAB
800 mg | ORAL_TABLET | Freq: Every evening | ORAL | Status: DC | PRN
Start: 2009-06-05 — End: 2009-10-15

## 2009-06-05 NOTE — Progress Notes (Signed)
HISTORY OF PRESENT ILLNESS  Lindsay [name] is a 54 y.o. female.  HPI  Lindsay [name] is seen in follow up.  She was having bad right sided temporal area headache, and had a temporal artery biopsy, which was fortunately negative.  She has tapered off of her prednisone, and completely off since four days ago.  She does not feel well.  She is still having dull throbbing headaches, does not describe it as burning.  It involves right head, neck, upper back area.  Does not radiate into arms.  No fevers or chills.  She feels a little twitchy, just coming off prednisone.  She is tired and generally feels down.      MedDATA/jtm       Review of Systems   Constitutional: Positive for malaise/fatigue. Negative for fever and chills.   Respiratory: Negative for cough and shortness of breath.    Cardiovascular: Negative for chest pain.   Neurological: Positive for headaches. Negative for weakness.   Psychiatric/Behavioral: Positive for depression. The patient is not nervous/anxious.        Physical Exam   Nursing note and vitals reviewed.  Constitutional: She is oriented to person, place, and time. She appears well-developed and well-nourished.        Healing right temporal scar   HENT:   Head: Normocephalic and atraumatic.   Neck: Normal range of motion. Neck supple. Carotid bruit is not present. No thyromegaly present.   Cardiovascular: Normal rate, regular rhythm, S1 normal, S2 normal, normal heart sounds and intact distal pulses.    No murmur heard.  Pulmonary/Chest: Effort normal and breath sounds normal. No respiratory distress. She has no wheezes. She has no rales.   Musculoskeletal: She exhibits no edema.        Tender over right trapezius good rom of neck and shoulder    Neurological: She is alert and oriented to person, place, and time.   Psychiatric: She has a normal mood and affect. Her behavior is normal.       ASSESSMENT and PLAN  Lindsay [name] was seen today for follow-up.    Diagnoses and associated orders for this visit:     Neck pain-suspect all muscle contraction-no temporal arteritis thankfully  - REFERRAL TO PHYSICAL THERAPY  - metaxalone (SKELAXIN) 800 mg tablet; Take 1 Tab by mouth nightly as needed for Pain for 10 days.    Headache    Depression      appt in 12mo

## 2009-06-17 MED ORDER — CYCLOBENZAPRINE 10 MG TAB
10 mg | ORAL_TABLET | Freq: Every evening | ORAL | Status: AC
Start: 2009-06-17 — End: 2009-06-27

## 2009-06-17 NOTE — Progress Notes (Signed)
HISTORY OF PRESENT ILLNESS  Lindsay [name] is a 53 y.o. female.  HPI  Lindsay [name] continues to feel poorly. She has not been able to get into PT yet because of scheduling.  She tried Skelaxin but it had a response making her have troubles sleeping.  She is still having pain on right side of head, neck and into right side of chest.  She feels as though her lungs are sore. She has not had fever or chills. She is fatigued. She went to the eye doctor and they questioned issues with extraocular movement testing. She had pain with movement of her eyes. She has not been back to the psychiatrist. She is not on the Vyvanse but remains on Lexapro and Valium.     MedDATA/ruc      Review of Systems   Constitutional: Positive for malaise/fatigue. Negative for fever, chills and diaphoresis.   Eyes: Negative for blurred vision, double vision, photophobia, discharge and redness.   Respiratory: Negative for cough.    Cardiovascular: Positive for chest pain.   Musculoskeletal: Positive for myalgias and back pain.   Neurological: Positive for headaches. Negative for dizziness, sensory change and focal weakness.       Physical Exam   Nursing note and vitals reviewed.  Constitutional: She appears well-developed and well-nourished.   HENT:   Head: Normocephalic.   Right Ear: Tympanic membrane, external ear and ear canal normal.   Left Ear: Tympanic membrane, external ear and ear canal normal.   Nose: Nose normal.   Mouth/Throat: Oropharynx is clear and moist and mucous membranes are normal. No oropharyngeal exudate.   Eyes: Conjunctivae are normal. Pupils are equal, round, and reactive to light. Right eye exhibits no discharge. Left eye exhibits no discharge.   Neck: Normal range of motion. Neck supple.   Cardiovascular: Normal rate, regular rhythm and normal heart sounds.    Pulmonary/Chest: Effort normal and breath sounds normal. No respiratory distress. She has no wheezes. She has no rales.   Lymphadenopathy:      She has no cervical adenopathy.   Neurological:        nonfocal exam       ASSESSMENT and PLAN  Jaileigh was seen today for headache and fatigue.    Diagnoses and associated orders for this visit:    Neck pain  - cyclobenzaprine (FLEXERIL) 10 mg tablet; Take 1 Tab by mouth nightly for 10 days.  - CBC WITH AUTOMATED DIFF    Headache  - CT HEAD WITH CONTRAST; Future  - METABOLIC PANEL, COMPREHENSIVE    Depression    Chest pain  - XR CHEST PA AND LATERAL; Future  - METABOLIC PANEL, COMPREHENSIVE    Other Orders  - acetaminophen (TYLENOL) 325 mg tablet; Take  by mouth every four (4) hours as needed.  appt in 1 week

## 2009-06-18 LAB — CBC WITH AUTOMATED DIFF
ABS. BASOPHILS: 0 10*3/uL (ref 0.0–0.2)
ABS. EOSINOPHILS: 0.2 10*3/uL (ref 0.0–0.4)
ABS. IMM. GRANS.: 0 10*3/uL (ref 0.0–0.1)
ABS. LYMPHOCYTES: 2.1 10*3/uL (ref 0.7–4.5)
ABS. MONOCYTES: 0.5 10*3/uL (ref 0.1–1.0)
ABS. NEUTROPHILS: 3 10*3/uL (ref 1.8–7.8)
BASOPHILS: 1 % (ref 0–3)
EOSINOPHILS: 4 % (ref 0–7)
HCT: 37 % (ref 34.0–44.0)
HGB: 12.3 g/dL (ref 11.5–15.0)
IMMATURE GRANULOCYTES: 0 % (ref 0–1)
LYMPHOCYTES: 36 % (ref 14–46)
MCH: 28.9 pg (ref 27.0–34.0)
MCHC: 33.2 g/dL (ref 32.0–36.0)
MCV: 87 fL (ref 80–98)
MONOCYTES: 8 % (ref 4–13)
NEUTROPHILS: 51 % (ref 40–74)
PLATELET: 272 10*3/uL (ref 140–415)
RBC: 4.26 x10E6/uL (ref 3.80–5.10)
RDW: 14.7 % (ref 11.7–15.0)
WBC: 5.8 10*3/uL (ref 4.0–10.5)

## 2009-06-18 LAB — METABOLIC PANEL, COMPREHENSIVE
A-G Ratio: 1.8 (ref 1.1–2.5)
ALT (SGPT): 27 IU/L (ref 0–40)
AST (SGOT): 27 IU/L (ref 0–40)
Albumin: 4.5 g/dL (ref 3.5–5.5)
Alk. phosphatase: 125 IU/L (ref 25–150)
BUN/Creatinine ratio: 26 (ref 8–27)
BUN: 17 mg/dL (ref 5–26)
Bilirubin, total: 0.2 mg/dL (ref 0.0–1.2)
CO2: 26 mmol/L (ref 20–32)
Calcium: 9.6 mg/dL (ref 8.7–10.2)
Chloride: 99 mmol/L (ref 97–108)
Creatinine: 0.65 mg/dL (ref 0.57–1.00)
GFR est AA: >59 mL/min/{1.73_m2} (ref >59)
GFR est non-AA: >59 mL/min/{1.73_m2} (ref >59)
GLOBULIN, TOTAL: 2.5 g/dL (ref 1.5–4.5)
Glucose: 83 mg/dL (ref 65–99)
Potassium: 4 mmol/L (ref 3.5–5.2)
Protein, total: 7 g/dL (ref 6.0–8.5)
Sodium: 137 mmol/L (ref 135–145)

## 2009-06-18 MED ADMIN — gadopentetate dimeglumine (MAGNEVIST) 7.5 mmol/15 mL (469.01 mg/mL) contrast solution 15 mL: INTRAVENOUS | @ 18:00:00 | NDC 50419018815

## 2009-06-18 MED FILL — MAGNEVIST 7.5 MMOL/15 ML (469.01 MG/ML) INTRAVENOUS SOLUTION: 7.5 mmol/15 mL (469.01 mg/mL) | INTRAVENOUS | Qty: 15

## 2009-06-18 NOTE — Telephone Encounter (Signed)
Discussed head ct-needs mri to further evaluate the abnormalities on her ct scan-reviewed normal cxr

## 2009-06-19 NOTE — Telephone Encounter (Signed)
Discussed abnormal mri-needs to see neurology for further evaluation -will work on getting her an appt

## 2009-06-19 NOTE — Progress Notes (Signed)
Quick Note:    See phone notes-to see Dr Jettie Pagan from neuro  ______

## 2009-06-19 NOTE — Telephone Encounter (Signed)
Spoke re appt with dr Jettie Pagan next Thursday 12/23 at 4 pm -she will call office for directions and knows to arrive early for paperwork

## 2009-07-07 NOTE — Progress Notes (Signed)
HISTORY OF PRESENT ILLNESS  Lindsay [name] is a 54 y.o. female.  HPI  Lindsay [name] comes in for follow up. She did see Dr. Jettie Pagan who felt that her pain was consistent with an occipital neuralgia, less likely to be MS. He has given her an anti-seizure medicine she thinks is carbamazepine which she has just started in the last week.  Some delay in starting because she had a GI virus. She is still having some dull aches on the right side of her head.  Had a few episodes of sharp aches on the right side of her head.      Has a list of other neurologic symptoms she has been aware of, including one episode of weakness in right arm, some trouble with speech-finding and some trouble with balance.  I have suggested that certainly she should continue to track these symptoms and report them to Dr. Jettie Pagan when she sees him in February. She was wondering about another opinion and I have discussed with her that this is certainly always valid, particularly if she does not approve of the treatment.      She has had a recent cold with head congestion and some sore throat and ear discomfort.     MedDATA/ruc      Review of Systems   Constitutional: Negative.  Negative for fever, chills, malaise/fatigue and diaphoresis.   HENT: Positive for congestion and sore throat. Negative for ear pain, nosebleeds and ear discharge.    Eyes: Negative for pain and discharge.   Respiratory: Negative for cough, hemoptysis, sputum production, shortness of breath and wheezing.    Cardiovascular: Negative for chest pain.   Neurological: Negative for headaches.       Physical Exam   Nursing note and vitals reviewed.  Constitutional: She appears well-developed and well-nourished.   HENT:   Head: Normocephalic.   Right Ear: Tympanic membrane, external ear and ear canal normal.   Left Ear: Tympanic membrane, external ear and ear canal normal.   Nose: Nose normal.   Mouth/Throat: Oropharynx is clear and moist and mucous membranes are normal. No oropharyngeal exudate.    Eyes: Conjunctivae are normal. Pupils are equal, round, and reactive to light. Right eye exhibits no discharge. Left eye exhibits no discharge.   Neck: Normal range of motion. Neck supple.   Cardiovascular: Normal rate, regular rhythm and normal heart sounds.    Pulmonary/Chest: Effort normal and breath sounds normal. No respiratory distress. She has no wheezes. She has no rales.   Lymphadenopathy:     She has no cervical adenopathy.       ASSESSMENT and PLAN  Lindsay [name] was seen today for follow-up.    Diagnoses and associated orders for this visit:    Headache-? Occipital neuralgia-trial of meds from Dr Jettie Pagan and she will also have a repeat mri to follow the lesions-aware that multiple sclerosis diagnosis is not ruled out yet    Abnormal mri of head    Depression    Uri (upper respiratory infection)    Viral  Reviewed comfort measures for symptoms including tylenol every four hours prn, fluids and rest.

## 2009-07-08 NOTE — Telephone Encounter (Signed)
[facility] 07-08-09 12:53pm mhouchens Dr Darl Pikes pt dob 2055/10/08 pt was seen  06-26-09 by Dr Jettie Pagan, pt was given Oxcarbazepine . Best number to reach pt [phone]

## 2009-07-08 NOTE — Telephone Encounter (Signed)
Tried calling no answer will add to med list and forward to dr Darl Pikes to review

## 2009-07-09 NOTE — Telephone Encounter (Signed)
Per patient she received call yesterday from the office around 2 pm and she did not know if Lindsay [name] or dr Darl Pikes called her.? Did not leave a message Please call her and let her know some thing at home and her no is [phone]

## 2009-07-09 NOTE — Telephone Encounter (Signed)
Med list updated with new med - per patients request

## 2009-07-14 NOTE — Telephone Encounter (Signed)
Pt is having the same problem on left side of head (previously right). Please call to advise [phone]

## 2009-07-14 NOTE — Telephone Encounter (Signed)
Advised since she is currently being followed by Dr. Jettie Pagan for her head pain, I advised her to contact them for futher advice/eval.

## 2009-07-22 NOTE — Telephone Encounter (Signed)
Plz call pt ver cell#, RE: problems with right side of her head, found knot on left side of neck

## 2009-07-22 NOTE — Telephone Encounter (Signed)
Little knot on left side of neck - back of neck area, not painful, would like someone to take a look at it. appt made for 1pm today

## 2009-07-22 NOTE — Progress Notes (Signed)
HISTORY OF PRESENT ILLNESS  Lindsay [name] is a 54 y.o. female.  HPI  [name] seen concerned about a lump on the back of the left side of her neck.  She is not having fevers, chills, sore throat or ear pain. She does not feel that physical therapy for her neck muscles has helped any with her headaches, in fact, she feels she is getting worse. She is having more trouble with recall of words.  Dr. Jettie Pagan has titrated her Trileptal dose up. She will be seeing her psychiatrist tomorrow and I have suggested she discuss with him possibly increased treatment for the anxiety which I think is escalating along with the unknown of underlying medical diagnosis.  She is thinking about a second opinion from neurology and I have encouraged this.     MedDATA/ruc      Review of Systems   Constitutional: Negative for fever.   HENT: Negative for congestion and sore throat.    Eyes: Negative for double vision.   Respiratory: Negative for cough.    Neurological: Positive for headaches.   Psychiatric/Behavioral: Positive for memory loss (word finding difficulty). The patient is nervous/anxious.        Physical Exam   Nursing note and vitals reviewed.  Constitutional: She appears well-developed and well-nourished.   HENT:   Head: Normocephalic.   Right Ear: Tympanic membrane, external ear and ear canal normal.   Left Ear: Tympanic membrane, external ear and ear canal normal.   Nose: Nose normal. Right sinus exhibits no maxillary sinus tenderness and no frontal sinus tenderness. Left sinus exhibits no maxillary sinus tenderness and no frontal sinus tenderness.   Mouth/Throat: Oropharynx is clear and moist and mucous membranes are normal. No oropharyngeal exudate.   Eyes: Conjunctivae are normal. Pupils are equal, round, and reactive to light. Right eye exhibits no discharge. Left eye exhibits no discharge.   Neck: Normal range of motion. Neck supple.   Cardiovascular: Normal rate, regular rhythm and normal heart sounds.     Pulmonary/Chest: Breath sounds normal. No respiratory distress. She has no wheezes. She has no rales.   Lymphadenopathy:     She has cervical adenopathy (left posterior chain with small pea sized node no other palpable nodes).       ASSESSMENT and PLAN  Titiana was seen today for mass.    Diagnoses and associated orders for this visit:    Adenopathy-small posterior cervical-no other adenopathy-monitor and recheck in 17mo-reassured not likely sign of any clinical sig    Depression/anxiety-suspect increased anxiety from recent medical issues-advised discuss with psychiatrist further meds  Headache-on meds for neuralgia-plans to get a second opinion from neuro

## 2009-07-24 NOTE — Telephone Encounter (Signed)
[facility] 07/24/2009 9:24am jwells Dr. Darl Pikes DOB06/15/1957 [facility] [phone] Pt is requesting to pickup a copy of her CAT scan performed on 06/17/2009 at [facility]. Scan is needed for her upcoming appt with the Neurologist scheduled on Tuesday, 07/29/2009.

## 2009-07-24 NOTE — Telephone Encounter (Signed)
L/Vm for pt advising we only have written report. If she needs the actual CD she will need to call Georgina Pillion to receive.

## 2009-08-08 MED ADMIN — gadopentetate dimeglumine (MAGNEVIST) 7.5 mmol/15 mL (469.01 mg/mL) contrast solution 14 mL: INTRAVENOUS | @ 16:00:00 | NDC 50419018815

## 2009-08-08 MED FILL — MAGNEVIST 7.5 MMOL/15 ML (469.01 MG/ML) INTRAVENOUS SOLUTION: 7.5 mmol/15 mL (469.01 mg/mL) | INTRAVENOUS | Qty: 15

## 2009-08-26 NOTE — Progress Notes (Signed)
HISTORY OF PRESENT ILLNESS  Lindsay [name] is a 54 y.o. female.  HPI  Lindsay [name] is seen at follow-up. She has seen Dr. Jettie Pagan and had an occipital nerve block which seems to be helping some with the headache on the right.  She still has right-sided ear pain, eye twitching, headache, and eye pain.  She is now off of the anti-seizure medicines for suppression. She has seen Dr. Oretha Milch for second opinion from neurology who is still concerned about possibility of underlying MS. She has had a cervical spine MRI which was negative for demyelinating lesion.  They are trying to get a MRI of T spine and L spine but this has been held up by insurance which has not yet approved it. She has had lab work done which was apparently normal, they have discussed a lumbar puncture but this has not been pursued.  Lindsay [name] does have a follow-up with Dr. Jettie Pagan in March and has plans for another cranial MRI in June.  She also has a small area on her left cheek present for three weeks with some redness and scale to it.  It is minimally itchy. She has had no fevers or chills. She seems to be handling the stress of the unknown very well.      MedDATA/amm      Review of Systems   Constitutional: Negative for fever and chills.   HENT: Negative for hearing loss.    Eyes: Positive for pain (on right).   Cardiovascular: Negative for chest pain.   Neurological: Positive for tingling, focal weakness (right arm) and headaches. Negative for seizures and loss of consciousness.   Psychiatric/Behavioral: The patient is nervous/anxious.        Physical Exam   Nursing note and vitals reviewed.  Constitutional: She is oriented to person, place, and time. She appears well-developed and well-nourished.   Neurological: She is alert and oriented to person, place, and time.   Psychiatric: She has a normal mood and affect. Her behavior is normal. Judgment and thought content normal.       ASSESSMENT and PLAN  Lindsay [name] was seen today for follow-up.     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  appt in the summer but sooner prn

## 2009-09-29 MED ADMIN — gadopentetate dimeglumine (MAGNEVIST) 7.5 mmol/15 mL (469.01 mg/mL) contrast solution 14 mL: INTRAVENOUS | @ 17:00:00 | NDC 50419018815

## 2009-09-29 MED FILL — MAGNEVIST 7.5 MMOL/15 ML (469.01 MG/ML) INTRAVENOUS SOLUTION: 7.5 mmol/15 mL (469.01 mg/mL) | INTRAVENOUS | Qty: 15

## 2009-10-06 MED ORDER — DIAZEPAM 10 MG TAB
10 mg | ORAL_TABLET | ORAL | Status: DC
Start: 2009-10-06 — End: 2009-10-15

## 2009-10-06 NOTE — Telephone Encounter (Signed)
Please call in -thanks

## 2009-10-06 NOTE — Telephone Encounter (Addendum)
rx called to pharmacy

## 2009-10-15 NOTE — Progress Notes (Signed)
HISTORY OF PRESENT ILLNESS  Lindsay [name] is a 54 y.o. female.  HPI  Lindsay [name] comes in primarily for consultation.  Lindsay [name] has seen Dr. Jettie Pagan and Dr. Alison Murray from Neurology.  There has certainly been consideration of possible multiple sclerosis, but this diagnosis has not been definitively made.  Lindsay [name] recently was able to have a thoracic spine MRI which was clear.  Lindsay [name] will have another cranial MRI in June.  Lindsay [name] has had another nerve block, which does seem to help with some of the right-sided headache and pain, although Lindsay [name] continues to have a twitchy sensation in Lindsay [name] eye and intermittent pain in the right temporal region, that Lindsay [name] has been having now for months.  Lindsay [name] also occasionally has numbness in the toes and hands.  Lindsay [name] has just been started on Lyrica 75 mg daily by Dr. Jettie Pagan, with plans to increase to b.i.d.  Lindsay [name] notes that Lindsay [name] does have trouble with concentration, and Dr. Salley Hews, Lindsay [name] Psychiatrist, had considered trying Lindsay [name] on Vyvanse, but has been holding off until the neurologic issues are more well defined.  Lindsay [name] also has a rectal tear and has been postponing surgical repair of this for the same reason.  Lindsay [name] does get intermittent rectal pain and bleeding.      MedDATA/pag       Review of Systems   Constitutional: Negative for fever, chills and malaise/fatigue.   Eyes: Positive for pain. Negative for blurred vision, double vision, discharge and redness.   Respiratory: Negative for shortness of breath.    Cardiovascular: Negative for chest pain.   Neurological: Positive for tingling and headaches. Negative for dizziness, speech change and focal weakness.   Psychiatric/Behavioral: Positive for memory loss. The patient is nervous/anxious.        Physical Exam   Nursing note and vitals reviewed.  Constitutional: Lindsay [name] is oriented to person, place, and time. Lindsay [name] appears well-developed and well-nourished.   Neurological: Lindsay [name] is alert and oriented to person, place, and time.    Psychiatric: Lindsay [name] has a normal mood and affect. Lindsay [name] behavior is normal. Judgment and thought content normal.       ASSESSMENT and PLAN  Lindsay [name] was seen today for depression.    Diagnoses and associated orders for this visit:    Headache    Depression    Visual disturbance-? Of multiple sclerosis-discussed at length that this diagnosis is still a possibility although clearly not defined at this time-Lindsay [name] is going to simplify Lindsay [name] care by seeing only   Dr Jettie Pagan from neuro ongoing and we also discussed option to inc lyrica if pain persists  Over 50% of  Visit of 30 minutes spent reviewing diagnosis and treatment options and side effects of medicines.    appt in 1mo and may inc lyrica then  Lindsay [name] sees dr epps in june  Other Orders  - buPROPion SR [facility] SR) 150 mg SR tablet; Take  by mouth daily.  - pregabalin (LYRICA) 75 mg capsule; Take 75 mg by mouth two (2) times a day.

## 2009-12-03 MED ORDER — GADOPENTETATE DIMEGLUMINE 7.5 MMOL/15 ML (469.01 MG/ML) IV
7.5 mmol/15 mL (469.01 mg/mL) | Freq: Once | INTRAVENOUS | Status: AC
Start: 2009-12-03 — End: 2009-12-03
  Administered 2009-12-03: 16:00:00 via INTRAVENOUS

## 2009-12-03 MED FILL — MAGNEVIST 7.5 MMOL/15 ML (469.01 MG/ML) INTRAVENOUS SOLUTION: 7.5 mmol/15 mL (469.01 mg/mL) | INTRAVENOUS | Qty: 15

## 2010-02-24 NOTE — Progress Notes (Signed)
HISTORY OF PRESENT ILLNESS  Lindsay [name] is a 54 y.o. female.  HPI    Seen at six month follow up.   1. Overall doing better. She has now seen three neurologists for the question of possible MS. Two of the three feel she does not have MS. Dr. Oretha Milch apparently is concerned that she might. Her most recent MRI fortunately was very stable.  She is thinking of going to the [facility] for another opinion.   2. Headaches.  Still having on the right side of head with occasional twitching in eye and some weakness in right arm.  Fortunately, pain level has diminished to a 2 to 3.  She is now on Gabapentin 300 mg b.i.d.  Notes that when stress levels diminished in her life, the intensity of headache improved.   3. Anxiety. She is on Lexapro 10 and Wellbutrin 150 as well as Valium 5 mg, half tablet, b.i.d. No new issues.     MedDATA/ruc      Review of Systems   Constitutional: Negative for fever, chills, weight loss and diaphoresis.   Eyes: Negative for blurred vision, double vision and pain.   Respiratory: Negative for shortness of breath.    Cardiovascular: Negative for chest pain.   Neurological: Positive for tingling and headaches. Negative for dizziness and sensory change.   Psychiatric/Behavioral: The patient is nervous/anxious.        Physical Exam   Nursing note and vitals reviewed.  Constitutional: She appears well-developed and well-nourished.   HENT:   Head: Normocephalic.   Right Ear: Tympanic membrane, external ear and ear canal normal.   Left Ear: Tympanic membrane, external ear and ear canal normal.   Nose: Nose normal.   Mouth/Throat: Oropharynx is clear and moist and mucous membranes are normal. No oropharyngeal exudate.   Eyes: Conjunctivae are normal. Pupils are equal, round, and reactive to light. Right eye exhibits no discharge. Left eye exhibits no discharge.   Neck: Normal range of motion. Neck supple.   Cardiovascular: Normal rate, regular rhythm and normal heart sounds.     Pulmonary/Chest: Effort normal and breath sounds normal. No respiratory distress. She has no wheezes. She has no rales.   Lymphadenopathy:     She has no cervical adenopathy.       ASSESSMENT and PLAN  Lindsay [name] was seen today for depression.    Diagnoses and associated orders for this visit:    Headache-may be a migraine variant with the right sided arm sxs-she is doing better on neurontin and will cont to follow up with neurology  - METABOLIC PANEL, COMPREHENSIVE  - CBC WITH AUTOMATED DIFF    Tremor    Anxiety    Dyslipidemia  - LIPID PANEL    Other Orders  - gabapentin (NEURONTIN) 300 mg capsule; Take 300 mg by mouth three (3) times daily.    appt in 6 mo

## 2010-02-25 LAB — METABOLIC PANEL, COMPREHENSIVE
A-G Ratio: 1.8 (ref 1.1–2.5)
ALT (SGPT): 9 IU/L (ref 0–40)
AST (SGOT): 16 IU/L (ref 0–40)
Albumin: 4.2 g/dL (ref 3.5–5.5)
Alk. phosphatase: 110 IU/L (ref 25–150)
BUN/Creatinine ratio: 14 (ref 9–23)
BUN: 10 mg/dL (ref 6–24)
Bilirubin, total: 0.2 mg/dL (ref 0.0–1.2)
CO2: 28 mmol/L (ref 20–32)
Calcium: 8.9 mg/dL (ref 8.7–10.2)
Chloride: 103 mmol/L (ref 97–108)
Creatinine: 0.73 mg/dL (ref 0.57–1.00)
GFR est AA: 106 mL/min/{1.73_m2} (ref >59)
GFR est non-AA: 92 mL/min/{1.73_m2} (ref >59)
GLOBULIN, TOTAL: 2.4 g/dL (ref 1.5–4.5)
Glucose: 94 mg/dL (ref 65–99)
Potassium: 4.5 mmol/L (ref 3.5–5.2)
Protein, total: 6.6 g/dL (ref 6.0–8.5)
Sodium: 144 mmol/L (ref 135–145)

## 2010-02-25 LAB — CBC WITH AUTOMATED DIFF
ABS. BASOPHILS: 0 10*3/uL (ref 0.0–0.2)
ABS. EOSINOPHILS: 0.2 10*3/uL (ref 0.0–0.4)
ABS. IMM. GRANS.: 0 10*3/uL (ref 0.0–0.1)
ABS. LYMPHOCYTES: 1.5 10*3/uL (ref 0.7–4.5)
ABS. MONOCYTES: 0.4 10*3/uL (ref 0.1–1.0)
ABS. NEUTROPHILS: 3.3 10*3/uL (ref 1.8–7.8)
BASOPHILS: 1 % (ref 0–3)
EOSINOPHILS: 3 % (ref 0–7)
HCT: 38.8 % (ref 34.0–44.0)
HGB: 12.6 g/dL (ref 11.5–15.0)
IMMATURE GRANULOCYTES: 0 % (ref 0–2)
LYMPHOCYTES: 27 % (ref 14–46)
MCH: 28.6 pg (ref 27.0–34.0)
MCHC: 32.5 g/dL (ref 32.0–36.0)
MCV: 88 fL (ref 80–98)
MONOCYTES: 7 % (ref 4–13)
NEUTROPHILS: 62 % (ref 40–74)
PLATELET: 216 10*3/uL (ref 140–415)
RBC: 4.4 x10E6/uL (ref 3.80–5.10)
RDW: 15.2 % — ABNORMAL HIGH (ref 11.7–15.0)
WBC: 5.3 10*3/uL (ref 4.0–10.5)

## 2010-02-25 LAB — LIPID PANEL
Cholesterol, total: 204 mg/dL — ABNORMAL HIGH (ref 100–199)
HDL Cholesterol: 65 mg/dL (ref >39)
LDL, calculated: 113 mg/dL — ABNORMAL HIGH (ref 0–99)
Triglyceride: 128 mg/dL (ref 0–149)
VLDL, calculated: 26 mg/dL (ref 5–40)

## 2010-02-25 NOTE — Progress Notes (Addendum)
Quick Note:    Notified by letter.    ______

## 2010-03-26 MED ORDER — DIAZEPAM 10 MG TAB
10 mg | ORAL_TABLET | ORAL | Status: DC
Start: 2010-03-26 — End: 2010-09-23

## 2010-03-26 NOTE — Telephone Encounter (Signed)
Please call in thanks

## 2010-03-26 NOTE — Telephone Encounter (Signed)
Called rx to pharmacy

## 2010-05-04 MED ORDER — OXAPROZIN 600 MG TAB
600 mg | ORAL_TABLET | Freq: Every day | ORAL | Status: DC
Start: 2010-05-04 — End: 2010-10-23

## 2010-05-04 NOTE — Progress Notes (Signed)
HISTORY OF PRESENT ILLNESS  Lindsay [name] is a 54 y.o. female.  HPI  Lindsay [name] comes in because of right sided rib pain. Three weeks ago she was stretching over seats in her SUV to reach the third row and she began having pain in her lower rib cage area. She went to [facility] and had a chest x-ray which was negative and was diagnosed with a muscle pull. They gave her muscle relaxer and high dose ibuprofen. She took it for about a week but it was making her overly sedated so stopped. She notes that she is having discomfort, particularly on the right lower rib cage area.  Feels a little bit of discomfort in epigastrium and back. She now feels short of breath and it is hard for her at times to take a deep breath, although this is intermittent and variable.  It is making her fearful. She has not had any recent fevers, chills or significant cough and she has not had any long car rides.     MedDATA/ruc      Review of Systems   Constitutional: Negative for fever, chills and weight loss.   Respiratory: Positive for shortness of breath (intermittent). Negative for cough and wheezing.    Cardiovascular: Positive for chest pain (right side of chest wall). Negative for palpitations, orthopnea, leg swelling and PND.   Gastrointestinal: Negative for heartburn and nausea.   Musculoskeletal: Negative for myalgias, back pain and joint pain.   Neurological: Negative for dizziness and headaches.       Physical Exam   Nursing note and vitals reviewed.  Constitutional: She is oriented to person, place, and time. She appears well-developed and well-nourished.   HENT:   Head: Normocephalic and atraumatic.   Neck: Normal range of motion. Neck supple. Carotid bruit is not present. No thyromegaly present.   Cardiovascular: Normal rate, regular rhythm, S1 normal, S2 normal, normal heart sounds and intact distal pulses.    No murmur heard.   Pulmonary/Chest: Effort normal and breath sounds normal. No respiratory distress. She has no wheezes. She has no rales.   Musculoskeletal: She exhibits no edema.        Tender on right side following rib distribution from posterior to anterior rib   Neurological: She is alert and oriented to person, place, and time.   Psychiatric: She has a normal mood and affect. Her behavior is normal.       ASSESSMENT and PLAN  [name] was seen today for rib pain.    Diagnoses and associated orders for this visit:    Rib injury  - XR CHEST PA AND LATERAL; Future  - oxaprozin (DAYPRO) 600 mg tablet; Take 1 Tab by mouth daily.    If cxr clear then use the daypro for 10 days and call if not improving or any change in sob

## 2010-05-04 NOTE — Progress Notes (Signed)
Quick Note:    Patient notified by phone,left a detailed message.- cxr wnl - advised if sx don't Improve or sob gets worse or fever - will need to notify us asap. Advised to call me back if she has any questions.  ______

## 2010-05-04 NOTE — Progress Notes (Signed)
Quick Note:    Please notify her normal  ______

## 2010-09-23 MED ORDER — DIAZEPAM 10 MG TAB
10 mg | ORAL_TABLET | ORAL | Status: DC
Start: 2010-09-23 — End: 2011-01-04

## 2010-09-23 NOTE — Telephone Encounter (Signed)
Called rx to pharmacy

## 2010-09-23 NOTE — Telephone Encounter (Signed)
Ok to call in

## 2010-10-23 MED ORDER — OXYCODONE-ACETAMINOPHEN 5 MG-325 MG TAB
5-325 mg | ORAL_TABLET | ORAL | Status: DC | PRN
Start: 2010-10-23 — End: 2011-01-04

## 2010-10-23 MED ORDER — OXYCODONE-ACETAMINOPHEN 5 MG-325 MG TAB
5-325 mg | ORAL | Status: AC
Start: 2010-10-23 — End: 2010-10-23
  Administered 2010-10-23: 21:00:00 via ORAL

## 2010-10-23 NOTE — ED Notes (Signed)
Pt tripped and turned left ankle today.  Large swelling of left lat. Ankle noted with strong pedal pulse. Color pink/ skin warm.  Moves toes. [facility] , splinted for fx and sent to ER.  Splint removed by Elvis Coil, Pa

## 2010-10-23 NOTE — Progress Notes (Signed)
Discharged via w/c to car.  Daughter with pt.  Pt. Left in no acute distress.l

## 2010-10-23 NOTE — ED Notes (Signed)
Left short leg post. Splint applied by T. Lafayette Dragon PA-C.  Positive pulse prior and post splinting.

## 2010-10-23 NOTE — ED Provider Notes (Signed)
HPI Comments: 55 yo female presents ambulatory with crutches to ED with cc of L ankle pain x today. Pt states she went to [facility] before ED, received XR, and was told that she fractured her L distal fibula. Pt notes that [facility] recommend she come to ED. Pt reports that she tripped over a dog gate and landed on her L ankle. Pt denies head injury or LOC.    ZOX:WRUEAVWUJ L GLYNN, MD     Social Hx: -tobacco/alcohol  PSHx: appendectomy    There are no other complaints, changes or physical findings at this time.   Written by Mickeal Skinner, ED Scribe, as dictated by Roxy Horseman     The history is provided by the patient.        Past Medical History   Diagnosis Date   ??? Headache 06/05/2009        Past Surgical History   Procedure Date   ??? Hx other surgical      left foot    ??? Hx cesarean section    ??? Hx appendectomy    ??? Hx orthopaedic      rotater cuff r. shoulder         No family history on file.     History     Social History   ??? Marital Status: Married     Spouse Name: N/A     Number of Children: N/A   ??? Years of Education: N/A     Occupational History   ??? Not on file.     Social History Main Topics   ??? Smoking status: Never Smoker    ??? Smokeless tobacco: Never Used   ??? Alcohol Use: No   ??? Drug Use: No   ??? Sexually Active: Yes -- Female partner(s)     Other Topics Concern   ??? Not on file     Social History Narrative   ??? No narrative on file                  ALLERGIES: Review of patient's allergies indicates no known allergies.      Review of Systems   Constitutional: Negative.    HENT: Negative.    Eyes: Negative.    Respiratory: Negative.    Cardiovascular: Negative.    Gastrointestinal: Negative.    Genitourinary: Negative.    Musculoskeletal: Positive for arthralgias.   Neurological: Negative.    All other systems reviewed and are negative.        Filed Vitals:    10/23/10 1606   BP: 103/66   Pulse: 77   Temp: 98.4 ??F (36.9 ??C)   Resp: 18   Height: 5\' 3"  (1.6 m)   Weight: 138 lb (62.596 kg)    SpO2: 96%            Physical Exam   Nursing note and vitals reviewed.  Constitutional: She is oriented to person, place, and time. She appears well-developed and well-nourished. No distress.   HENT:   Head: Normocephalic and atraumatic.   Right Ear: External ear normal.   Left Ear: External ear normal.   Nose: Nose normal.   Mouth/Throat: Oropharynx is clear and moist. No oropharyngeal exudate.   Eyes: Conjunctivae and EOM are normal. Pupils are equal, round, and reactive to light. Right eye exhibits no discharge. Left eye exhibits no discharge. No scleral icterus.   Neck: Normal range of motion. Neck supple. No tracheal deviation present.   Cardiovascular:  Normal rate, regular rhythm, normal heart sounds and intact distal pulses.  Exam reveals no gallop and no friction rub.    No murmur heard.  Pulmonary/Chest: Effort normal and breath sounds normal. No respiratory distress. She has no wheezes. She has no rales. She exhibits no tenderness.   Abdominal: Soft. Bowel sounds are normal. She exhibits no distension and no mass. There is no tenderness. There is no rebound and no guarding.   Musculoskeletal: Normal range of motion. She exhibits no edema and no tenderness.        Left ankle: She exhibits swelling. tenderness.        Contusion/swelling to lateral aspect of L ankle; nontender proximal L fibula    Lymphadenopathy:     She has no cervical adenopathy.   Neurological: She is alert and oriented to person, place, and time. No cranial nerve deficit.   Skin: Skin is warm and dry. No rash noted. No erythema.   Psychiatric: She has a normal mood and affect. Her behavior is normal.        MDM    Procedures    4:45 PM  Pt's XR from [facility] shows nondisplaced fracture to distal L fibula.  Written by Mickeal Skinner, ED Scribe, as dictated by Roxy Horseman    Procedure Note - Splint Placement:  4:46 PM  Performed by: Roxy Horseman   Neurovascularly intact prior to tx.  A posterior short leg splint was placed on pt's left fibula.  Joint was placed in neutral position.  Neurovascularly intact after tx.   The procedure took 1-15 minutes, and pt tolerated well.  Written by Mickeal Skinner, ED Scribe, as dictated by Roxy Horseman.    4:52 PM   Pt has been reexamined. Pt has no new complaints, changes or physical findings. Care plan outlined and precautions discussed. All available results were reviewed with pt. All medications were reviewed with pt. All of pt's questions and concerns were addressed. Pt agrees to F/U as instructed with orthopedics and agrees to return to ED upon further deterioration. Pt is ready to go home.  Written by Mickeal Skinner, ED Scribe, as dictated by Roxy Horseman.

## 2010-10-24 NOTE — ED Provider Notes (Signed)
I was personally available for consultation in the emergency department.  I have reviewed the chart and agree with the documentation recorded by the MLP, including the assessment, treatment plan, and disposition.  Colletta Spillers E Omesha Bowerman, MD

## 2010-11-25 MED ORDER — GADOPENTETATE DIMEGLUMINE 7.5 MMOL/15 ML (469.01 MG/ML) IV
7.5 mmol/15 mL (469.01 mg/mL) | Freq: Once | INTRAVENOUS | Status: AC
Start: 2010-11-25 — End: 2010-11-25
  Administered 2010-11-25: 17:00:00 via INTRAVENOUS

## 2011-01-04 MED ORDER — DIAZEPAM 10 MG TAB
10 mg | ORAL_TABLET | Freq: Two times a day (BID) | ORAL | Status: DC | PRN
Start: 2011-01-04 — End: 2012-01-25

## 2011-01-04 NOTE — Progress Notes (Signed)
HISTORY OF PRESENT ILLNESS  Lindsay [name] is a 55 y.o. female.  HPI  Six month follow up.    Issues:  1. Abnormal MRI with question of demyelinating disease.  Recent MRI was fortunately all very stable and she is seeing Dr. Oretha Milch who at this point has not diagnosed MS and is following.  2. Depression, seems fairly stable on Wellbutrin, Lexapro, and Valium.  I am prescribing her Valium.  3. She had an ankle fracture this spring and has been in a boot.  Mobilization has gradually been improving.    MedDATA/syh        Review of Systems   Constitutional: Negative for fever.   Cardiovascular: Negative for chest pain.   Gastrointestinal: Negative for abdominal pain.   Musculoskeletal: Positive for joint pain and falls.   Neurological: Positive for headaches (better).   Endo/Heme/Allergies: Positive for environmental allergies.   Psychiatric/Behavioral: Negative for depression and suicidal ideas. The patient is not nervous/anxious and does not have insomnia.        Physical Exam   Nursing note and vitals reviewed.  Constitutional: She is oriented to person, place, and time. She appears well-developed and well-nourished.   HENT:   Head: Normocephalic and atraumatic.   Neck: Normal range of motion. Neck supple. Carotid bruit is not present. No thyromegaly present.   Cardiovascular: Normal rate, regular rhythm, S1 normal, S2 normal, normal heart sounds and intact distal pulses.    No murmur heard.  Pulmonary/Chest: Effort normal and breath sounds normal. No respiratory distress. She has no wheezes. She has no rales.   Musculoskeletal: She exhibits no edema.   Neurological: She is alert and oriented to person, place, and time.   Psychiatric: She has a normal mood and affect. Her behavior is normal.       ASSESSMENT and PLAN  Lindsay [name] was seen today for follow-up and depression.    Diagnoses and associated orders for this visit:    Depression   - diazepam (VALIUM) 10 mg tablet; Take 1 Tab by mouth every twelve (12) hours as needed for Anxiety.    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

## 2011-07-02 ENCOUNTER — Encounter

## 2011-07-15 NOTE — Telephone Encounter (Signed)
Pt calling to advise of health issues she is having. Pt had pain in middle of chest on Dec 27, went away after rest. Only one other mild occurrence since. Was not concerned until she went to podiatrist and received MRI as the results determined she may have a vascular problem. She seem to be tired all the time also. Would like to know Dr. Verlee Monte thoughts on situation. Advised that an appt would be needed and provided one for pt. Advised that if she should have symptoms prior to appt tomorrow. Radiating chest pain, nausea, sweats etc to be eval in ED as they are equipped to do all spectrum of testing. Pt agreed to plan

## 2011-07-16 NOTE — Progress Notes (Signed)
HISTORY OF PRESENT ILLNESS  Lindsay [name] is a 57 y.o. female.  HPI  On 12/27 she was seated at office and had midsternal and epigastric pain which lasted about 25 min with a little tingling and pain in left arm also felt a choking feeling -next am had diarrhea briefly. Another episode on new years eve similar  sxs she lay in recliner and finally felt better.  Feels very tired and feels stressed.right foot second toe is sore and tight. 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 She was in a boot and will see him in 2 weeks. She is on med from gi but can't recall name-does not use nsaids or caffeine often. No fevers and no n/v  Review of Systems   Constitutional: Negative for weight loss.   Respiratory: Positive for shortness of breath. Negative for cough and wheezing.    Cardiovascular: Positive for chest pain. Negative for palpitations.   Gastrointestinal: Positive for nausea. Negative for vomiting, diarrhea, constipation, blood in stool and melena.   Psychiatric/Behavioral: The patient is nervous/anxious.        Physical Exam   Nursing note and vitals reviewed.  Constitutional: She is oriented to person, place, and time. She appears well-developed and well-nourished.   HENT:   Head: Normocephalic and atraumatic.   Neck: Normal range of motion. Neck supple. Carotid bruit is not present. No thyromegaly present.   Cardiovascular: Normal rate, regular rhythm, S1 normal, S2 normal, normal heart sounds and intact distal pulses.    No murmur heard.  Pulmonary/Chest: Effort normal and breath sounds normal. No respiratory distress. She has no wheezes. She has no rales.   Abdominal: Soft. She exhibits no mass. There is tenderness. There is no rebound.   Musculoskeletal: She exhibits no edema.        Some tenderness over chest wall costochondral junctions   Neurological: She is alert and oriented to person, place, and  time.   Psychiatric: She has a normal mood and affect. Her behavior is normal.       ASSESSMENT and PLAN  Lindsay [name] was seen today for chest pain.    Diagnoses and associated orders for this visit:    Chest pain at rest-suspect esophageal but need to be sure no cardiac issues  She will see me in 2 weeks with name of gi med she is currently using from dr gelrud   Avoid nsaids for now  If sxs escalate can certainly go to er  - AMB POC EKG ROUTINE W/ 12 LEADS, INTER & REP  - ECHO TTE STRESS EXERCISE TREADMILL COMP; Future

## 2011-07-16 NOTE — Progress Notes (Signed)
"  REVIEWED RECORD IN PREPARATION FOR VISIT AND HAVE OBTAINED NECESSARY DOCUMENTATION"

## 2011-07-21 ENCOUNTER — Encounter

## 2011-07-22 NOTE — Procedures (Signed)
[facility]                                [address]                        [place], [place] [address]      Name:      Lindsay [name], Lindsay [name]              Service Date:  DOB:       Mar 18, 1956                   Ordered by:                                          Location:  Sex:       F                            Age:             56  Billing #: [id number]                 Date of Adm:     07/21/2011                            STRESS ECHOCARDIOGRAPHY REPORT      PROCEDURE: Stress echo.    INDICATION: Chest pain.    REFERRING DOCTOR:  Dr. Golda Acre.    DESCRIPTION OF PROCEDURE:  The patient exercised according to Bruce  protocol. She displayed good exercise tolerance. She exercised for a total  duration of 9 minutes, completing Bruce stage III, and achieved a heart  rate of 141 beats per minute, which represents 86% of age-predicted maximal  heart rate. The resting blood pressure is 118/76 and the maximum blood  pressure 150/74. This is a normal pulse and blood pressure response. The  patient had some type of chest awareness. There were no EKG changes of  ischemia. The echocardiogram was normal at rest; and following exercise,  the left ventricular cavity size diminished, ejection fraction improved,  and there were no exercise-induced segmental wall motion abnormalities.    SUMMARY  1. Normal ECG response following exercise, producing greater than 85% of  age-predicted maximal heart rate.  2. Chest discomfort was atypical.  3. Normal echo prior 2 and after exercise.  4. Normal stress echo.        Jamy Cleckler C. Pricilla Holm, MD    cc:    Vale Haven. Pricilla Holm, MD    SCT/wmx; D:  07/22/2011 08:12 A; T:  07/22/2011 09:18 P; DOC# [id number]; JOB#  [id number]

## 2011-07-22 NOTE — Procedures (Signed)
[facility]                                [address]                        [place], [place] [address]      Name:      Mackiewicz, Loveta A              Service Date:  DOB:       08/21/1955                   Ordered by:                                          Location:  Sex:       F                            Age:             55  Billing #: [id number]                 Date of Adm:     07/21/2011                            STRESS ECHOCARDIOGRAPHY REPORT      PROCEDURE: Stress echo.    INDICATION: Chest pain.    REFERRING DOCTOR:  Dr. Francesca Glynn.    DESCRIPTION OF PROCEDURE:  The patient exercised according to Bruce  protocol. She displayed good exercise tolerance. She exercised for a total  duration of 9 minutes, completing Bruce stage III, and achieved a heart  rate of 141 beats per minute, which represents 86% of age-predicted maximal  heart rate. The resting blood pressure is 118/76 and the maximum blood  pressure 150/74. This is a normal pulse and blood pressure response. The  patient had some type of chest awareness. There were no EKG changes of  ischemia. The echocardiogram was normal at rest; and following exercise,  the left ventricular cavity size diminished, ejection fraction improved,  and there were no exercise-induced segmental wall motion abnormalities.    SUMMARY  1. Normal ECG response following exercise, producing greater than 85% of  age-predicted maximal heart rate.  2. Chest discomfort was atypical.  3. Normal echo prior 2 and after exercise.  4. Normal stress echo.        Khai Arrona C. Adan Beal, MD    cc:    Erlin Gardella C. Manila Rommel, MD    SCT/wmx; D:  07/22/2011 08:12 A; T:  07/22/2011 09:18 P; DOC# [id number]; JOB#  [id number]

## 2011-07-25 NOTE — Telephone Encounter (Signed)
Please call in thanks

## 2011-07-26 MED ORDER — DIAZEPAM 10 MG TAB
10 mg | ORAL_TABLET | Freq: Four times a day (QID) | ORAL | Status: DC | PRN
Start: 2011-07-26 — End: 2011-08-10

## 2011-07-26 NOTE — Telephone Encounter (Signed)
Called rx to pharmacy -

## 2011-08-10 NOTE — Progress Notes (Signed)
HISTORY OF PRESENT ILLNESS  Lindsay [name] is a 56 y.o. female.  HPI  She is no longer having significant chest pain- her stress echo was negative.   She is using prilosec qd. She is having pain in right foot and mri per dr Shar[facility] showed some abnormality and ongoing pain in the top of  Foot with  Shoes that are tight over the top of her foot. She at times has pain at night. Ortho advised consider vascular eval if sxs persist and i think this is reasonable. She had a stool last week with brbpr after the stool. She had 2 episodes after but none since. She had colonoscopy about 3 years ago.  Review of Systems   Constitutional: Negative for fever, chills and weight loss.   Respiratory: Negative for cough, shortness of breath and wheezing.    Cardiovascular: Negative for chest pain, palpitations, orthopnea, leg swelling and PND.   Gastrointestinal: Positive for blood in stool. Negative for heartburn, nausea, abdominal pain and melena.   Musculoskeletal: Positive for joint pain (foot). Negative for myalgias.   Neurological: Negative for dizziness and headaches.       Physical Exam   Nursing note and vitals reviewed.  Constitutional: She is oriented to person, place, and time. She appears well-developed and well-nourished.   HENT:   Head: Normocephalic and atraumatic.   Neck: Normal range of motion. Neck supple. Carotid bruit is not present. No thyromegaly present.   Cardiovascular: Normal rate, regular rhythm, S1 normal, S2 normal, normal heart sounds and intact distal pulses.    No murmur heard.  Pulmonary/Chest: Effort normal and breath sounds normal. No respiratory distress. She has no wheezes. She has no rales.   Musculoskeletal: She exhibits no edema.        Right foot no obvious swelling or redness-tender on dorsal surface of mid foot   Neurological: She is alert and oriented to person, place, and time.   Psychiatric: She has a normal mood and affect. Her behavior is normal.       ASSESSMENT  and PLAN  Lindsay [name] was seen today for follow-up.    Diagnoses and associated orders for this visit:    Chest pain at rest=neg cardiac eval  Suspect gi  Use prilosec  Dietary modification reviewed  appt 6 mo sooner prn  - METABOLIC PANEL, COMPREHENSIVE  - LIPID PANEL  - CBC WITH AUTOMATED DIFF  - TSH, 3RD GENERATION    Foot pain, right-abn mri-agree with vascular eval    Other Orders  - omeprazole (PRILOSEC) 20 mg capsule; Take 20 mg by mouth daily.

## 2011-08-10 NOTE — Progress Notes (Signed)
"  REVIEWED RECORD IN PREPARATION FOR VISIT AND HAVE OBTAINED NECESSARY DOCUMENTATION"

## 2011-08-11 LAB — CBC WITH AUTOMATED DIFF
ABS. BASOPHILS: 0 10*3/uL (ref 0.0–0.2)
ABS. EOSINOPHILS: 0.1 10*3/uL (ref 0.0–0.4)
ABS. IMM. GRANS.: 0 10*3/uL (ref 0.0–0.1)
ABS. MONOCYTES: 0.4 10*3/uL (ref 0.1–1.0)
ABS. NEUTROPHILS: 2.7 10*3/uL (ref 1.8–7.8)
Abs Lymphocytes: 1.9 10*3/uL (ref 0.7–4.5)
BASOPHILS: 1 % (ref 0–3)
EOSINOPHILS: 2 % (ref 0–7)
HCT: 37.2 % (ref 34.0–46.6)
HGB: 12.4 g/dL (ref 11.1–15.9)
IMMATURE GRANULOCYTES: 0 % (ref 0–2)
Lymphocytes: 37 % (ref 14–46)
MCH: 28.8 pg (ref 26.6–33.0)
MCHC: 33.3 g/dL (ref 31.5–35.7)
MCV: 86 fL (ref 79–97)
MONOCYTES: 8 % (ref 4–13)
NEUTROPHILS: 52 % (ref 40–74)
PLATELET: 235 10*3/uL (ref 140–415)
RBC: 4.31 x10E6/uL (ref 3.77–5.28)
RDW: 13.5 % (ref 12.3–15.4)
WBC: 5.1 10*3/uL (ref 4.0–10.5)

## 2011-08-11 LAB — LIPID PANEL
Cholesterol, total: 272 mg/dL — ABNORMAL HIGH (ref 100–199)
HDL Cholesterol: 75 mg/dL (ref >39)
LDL, calculated: 145 mg/dL — ABNORMAL HIGH (ref 0–99)
Triglyceride: 262 mg/dL — ABNORMAL HIGH (ref 0–149)
VLDL, calculated: 52 mg/dL — ABNORMAL HIGH (ref 5–40)

## 2011-08-11 LAB — METABOLIC PANEL, COMPREHENSIVE
A-G Ratio: 2 (ref 1.1–2.5)
ALT (SGPT): 10 IU/L (ref 0–32)
AST (SGOT): 16 IU/L (ref 0–40)
Albumin: 4.5 g/dL (ref 3.5–5.5)
Alk. phosphatase: 104 IU/L (ref 25–150)
BUN/Creatinine ratio: 20 (ref 9–23)
BUN: 14 mg/dL (ref 6–24)
Bilirubin, total: 0.2 mg/dL (ref 0.0–1.2)
CO2: 25 mmol/L (ref 20–32)
Calcium: 9.5 mg/dL (ref 8.7–10.2)
Chloride: 102 mmol/L (ref 97–108)
Creatinine: 0.69 mg/dL (ref 0.57–1.00)
GFR est non-AA: 97 mL/min/{1.73_m2} (ref >59)
GLOBULIN, TOTAL: 2.3 g/dL (ref 1.5–4.5)
Glucose: 85 mg/dL (ref 65–99)
Potassium: 4.6 mmol/L (ref 3.5–5.2)
Protein, total: 6.8 g/dL (ref 6.0–8.5)
Sodium: 141 mmol/L (ref 134–144)
eGFR If African American: 112 mL/min/{1.73_m2} (ref >59)

## 2011-08-11 LAB — TSH 3RD GENERATION: TSH: 2.87 u[IU]/mL (ref 0.450–4.500)

## 2011-08-11 NOTE — Telephone Encounter (Signed)
Called left detailed voice mail message

## 2011-08-11 NOTE — Telephone Encounter (Signed)
Yes i will leave an order for her to repeat her lipids fasting and could you let her know this is not urgent she can do it in the next month at her convenience

## 2011-08-11 NOTE — Telephone Encounter (Signed)
Pt called to let you know that she did fast as far as eating but she did drink 2 cups of [employer] lattes. She would like to know if she should come back again but this time she will make sure she fast for both eating and drinking. She could be reached back at [phone] and stated that she is leaving out of town for two weeks this sat.

## 2011-08-11 NOTE — Progress Notes (Signed)
Quick Note:    Left a message re inc lipids repeat in 69mo  ______

## 2011-10-04 NOTE — Progress Notes (Signed)
[facility] [place]:   [phone]    Impression   HPI: Lindsay [name], a 56 y.o. year-old who presents for evaluation of possible thrombophlebitis in her right foot.  Patient reports that she had noticed a swollen, red, warm place on the anterior surface of her right foot in November 2012 which her orthopedic MD (Dr. Manson Passey) thought might be a stress fracture.  She wore "a boot" x 6 weeks but it did not improve.  She ended up having an MRI which did not show any evidence of structural issues or bony abnormalities.  According to her, Dr. Manson Passey told her that she may have a "circulation" problem in her anterior right foot and she wanted to seek a second opinion to see if that was the case and if there was anything that could be done.       She reports chronic headaches which have been extensively evaluated and she was told that she might have multiple sclerosis.  She noticed a right hand tremor recently and is going to have it evaluated as well.    Assessment/Plan:  1.  Possible PAD - will do ABI today to assess for decreased arterial circulation    Soc Hx:  No tobacco, occ etoh, no drugs, married with two children, employed as an Print production planner  Fam Hx: mother had MI at age 57, father is alive at age 82 but had , open heart surgery/pt with Dr. Gasper Lloyd, has AF, brother and sister A&W    She  has a past medical history of Headache (2011) and Depression.  She also has no past medical history of Hyperlipidemia; Essential hypertension; Diabetes; Stroke; or Myocardial infarction.        Past Surgical History   Procedure Date   ??? Hx other surgical 2010     left foot torn tendon   ??? Hx cesarean section    ??? Hx appendectomy    ??? Hx orthopaedic      rotater cuff r. shoulder   ??? Hx orthopaedic      left ankle fracture treated with air cast     Cardiovascular ROS: denies CP or palpitations  Respiratory ROS: denies SOB  Neurological ROS: positive for right hand tremor   All other systems negative except as above.      PE  Filed Vitals:    10/04/11 1418   BP: 106/80   Pulse: 75   Height: 5\' 3"  (1.6 m)   Weight: 149 lb 6.4 oz (67.767 kg)    Body mass index is 26.46 kg/(m^2).   General appearance - alert, well appearing, and in no distress  Mental status - affect appropriate to mood  Eyes - sclera anicteric, moist mucous membranes  Neck - supple, no significant adenopathy  Lymphatics - no  lymphadenopathy  Chest - clear to auscultation, no wheezes, rales or rhonchi  Heart - normal rate, regular rhythm, normal S1, S2, no murmurs, rubs, clicks or gallops  Abdomen - soft, nontender, nondistended, no masses or organomegaly  Back exam - full range of motion, no tenderness  Neurological - cranial nerves II through XII grossly intact, no focal deficit  Musculoskeletal - no muscular tenderness noted, normal strength  Extremities - peripheral pulses normal, slightly weaker pulse in right groin compared to left groin, no bruits, no pedal edema  Skin - normal coloration  no rashes    12 lead ECG: NSR 75bpm      Recent Labs:  Lab Results  Component Value Date/Time    Cholesterol, total 272 08/10/2011 11:45 AM    HDL Cholesterol 75 08/10/2011 11:45 AM    LDL, calculated 145 08/10/2011 11:45 AM    Triglyceride 262 08/10/2011 11:45 AM    CHOL/HDL Ratio 3.1 04/01/2009  2:15 PM     Lab Results   Component Value Date/Time    Creatinine 0.69 08/10/2011 11:45 AM     Lab Results   Component Value Date/Time    BUN 14 08/10/2011 11:45 AM     Lab Results   Component Value Date/Time    Potassium 4.6 08/10/2011 11:45 AM     No results found for this basename: HBA1C,  HGBE8     Lab Results   Component Value Date/Time    HGB 12.4 08/10/2011 11:45 AM     Lab Results   Component Value Date/Time    PLATELET 235 08/10/2011 11:45 AM       Reviewed:  Past Medical History   Diagnosis Date   ??? Headache 2011   ??? Depression      History   Smoking status   ??? Never Smoker    Smokeless tobacco   ??? Never Used     History   Alcohol Use No     No Known Allergies    Current Outpatient  Prescriptions   Medication Sig   ??? omeprazole (PRILOSEC) 20 mg capsule Take 20 mg by mouth daily.     ??? diazepam (VALIUM) 10 mg tablet Take 1 Tab by mouth every twelve (12) hours as needed for Anxiety.   ??? buPROPion SR (WELLBUTRIN SR) 150 mg SR tablet Take 300 mg by mouth daily.   ??? escitalopram (LEXAPRO) 10 mg tablet take 10 mg by mouth daily.       Skyelyn Scruggs Tessa Lerner, MD  [facility]  [address], [address]  [place], [place] [address]     Ankle branchial index done in the office is completely normal both at rest and with low level exercise.  I reassured her regarding these findings.  She has no evidence for significant arterial or venous disease in her lower extremities.    MedDATA/cjl

## 2011-10-04 NOTE — Progress Notes (Signed)
See scanned report.

## 2011-10-13 NOTE — Telephone Encounter (Signed)
Error

## 2011-10-13 NOTE — Telephone Encounter (Signed)
Called and left message encouraging patient to have a mammogram, pap smear, colonoscopy, and pneumonia vaccine. Encouraged patient to call physician's office to schedule and appointment.

## 2011-11-22 ENCOUNTER — Encounter

## 2011-12-21 MED ORDER — ONDANSETRON 4 MG TAB, RAPID DISSOLVE
4 mg | ORAL | Status: AC
Start: 2011-12-21 — End: 2011-12-21
  Administered 2011-12-21: 19:00:00 via ORAL

## 2011-12-21 MED ORDER — OXYCODONE-ACETAMINOPHEN 5 MG-325 MG TAB
5-325 mg | ORAL_TABLET | ORAL | Status: DC | PRN
Start: 2011-12-21 — End: 2012-04-21

## 2011-12-21 MED ORDER — HYDROMORPHONE (PF) 1 MG/ML IJ SOLN
1 mg/mL | INTRAMUSCULAR | Status: AC
Start: 2011-12-21 — End: 2011-12-21
  Administered 2011-12-21: 19:00:00 via INTRAMUSCULAR

## 2011-12-21 MED FILL — HYDROMORPHONE (PF) 1 MG/ML IJ SOLN: 1 mg/mL | INTRAMUSCULAR | Qty: 2

## 2011-12-21 MED FILL — ONDANSETRON 4 MG TAB, RAPID DISSOLVE: 4 mg | ORAL | Qty: 1

## 2011-12-21 NOTE — ED Notes (Signed)
Assumed care of patient who comes in with complaint of left arm pain secondary to a fall down 3 steps at her house this morning at around 1030 AM.  [facility] and documented a left humerus fracture.  They sent her here for further evaluation.  The patient is placed in a position of comfort with call bell in reach.

## 2011-12-21 NOTE — ED Provider Notes (Signed)
HPI Comments: Lindsay [name] is a 56 y.o. female who presents via EMS to the ED c/o L shoulder pain. Pt referred to ED by [facility] for L humerus fracture. Pt slipped and fell down 3 stairs at home at 1030 today. Pt deneis hitting her head or other complaints.     PCP: Clarene Critchley, MD     PMHx significant for: none  SHx significant for: none  Social hx: -tobacco    There are no other complaints, changes or physical findings at this time.  Written by Shon Hale, ED scribe as dictated by Coralie Common, MD.        The history is provided by the patient.        Past Medical History   Diagnosis Date   ??? Headache 2011   ??? Depression         Past Surgical History   Procedure Date   ??? Hx other surgical 2010     left foot torn tendon   ??? Hx cesarean section    ??? Hx appendectomy    ??? Hx orthopaedic      rotater cuff r. shoulder   ??? Hx orthopaedic      left ankle fracture treated with air cast         History reviewed. No pertinent family history.     History     Social History   ??? Marital Status: MARRIED     Spouse Name: N/A     Number of Children: N/A   ??? Years of Education: N/A     Occupational History   ??? Not on file.     Social History Main Topics   ??? Smoking status: Never Smoker    ??? Smokeless tobacco: Never Used   ??? Alcohol Use: No   ??? Drug Use: No   ??? Sexually Active: Yes -- Female partner(s)     Other Topics Concern   ??? Not on file     Social History Narrative   ??? No narrative on file                  ALLERGIES: Review of patient's allergies indicates no known allergies.      Review of Systems   Constitutional: Negative.    HENT: Negative.  Negative for neck pain.    Eyes: Negative.    Respiratory: Negative.    Cardiovascular: Negative.    Gastrointestinal: Negative.    Genitourinary: Negative.    Musculoskeletal: Positive for arthralgias (L shoulder). Negative for back pain.   Skin: Negative.    Neurological: Negative.    All other systems reviewed and are negative.        Filed Vitals:    12/21/11  1343   BP: 112/66   Pulse: 72   Temp: 97.4 ??F (36.3 ??C)   Resp: 16   Height: 5\' 3"  (1.6 m)   Weight: 68.04 kg (150 lb)   SpO2: 94%            Physical Exam   Nursing note and vitals reviewed.  Constitutional: She is oriented to person, place, and time. She appears well-developed and well-nourished.   HENT:   Head: Normocephalic and atraumatic.   Nose: Nose normal.   Mouth/Throat: Oropharynx is clear and moist. No oropharyngeal exudate.   Eyes: Conjunctivae are normal. Right eye exhibits no discharge. Left eye exhibits no discharge. No scleral icterus.   Neck: Normal range of motion. Neck supple.  No JVD present. No tracheal deviation present.   Cardiovascular: Normal rate, regular rhythm, normal heart sounds and intact distal pulses.  Exam reveals no gallop and no friction rub.    No murmur heard.  Pulmonary/Chest: Effort normal and breath sounds normal. No stridor. No respiratory distress. She has no wheezes. She has no rales. She exhibits no tenderness.   Abdominal: Soft. Bowel sounds are normal. She exhibits no distension and no mass. There is no tenderness. There is no rebound and no guarding.   Musculoskeletal: She exhibits tenderness. She exhibits no edema.        Mild tenderness proximal humerus. NVI distal LUE   Lymphadenopathy:     She has no cervical adenopathy.   Neurological: She is alert and oriented to person, place, and time. She has normal strength. No cranial nerve deficit or sensory deficit. Coordination normal. GCS eye subscore is 4. GCS verbal subscore is 5. GCS motor subscore is 6.   Skin: Skin is warm and dry. No rash noted. No erythema. No pallor.   Psychiatric: She has a normal mood and affect. Her behavior is normal. Judgment and thought content normal.        MDM     Differential Diagnosis; Clinical Impression; Plan:     Pt has proximal humerus fx of left arm on OSH film.  She is NVI.  There is no indication for emergent surgical intervention.  Pt referred to ER bc her pain was uncontrolled  and physician worried she would pass out.  Now feeling much better.  No other apparent injuries.  Amount and/or Complexity of Data Reviewed:    Review and summarize past medical records:  Yes   Discuss the patient with another provider:  Yes      Procedures    2:08 PM  Spoke with Dr. Danelle Earthly at [facility]. She sent pt to ED because she was unable to control pt's pain.  Written by Shon Hale, ED scribe as dictated by Coralie Common, MD.    CONSULT NOTE:   [phone] PM  Coralie Common, MD spoke with Paulla Fore, PA,   Specialty: ortho  Discussed pt's hx, disposition, and available diagnostic and imaging results. Reviewed care plans. Consulting physician agrees with plans as outlined. Recommends sling and ortho F/U.  Written by Shon Hale, ED Scribe as dictated by Coralie Common, MD.    DISCHARGE NOTE:   2:19 PM  Pt has been reexamined. Patient has no new complaints, changes, or physical findings.  Care plan outlined and precautions discussed.  Radiology results were reviewed with the patient. All medications were reviewed with the patient. All of pt's questions and concerns were addressed. Patient was instructed and agrees to follow up with ortho, as well as to return to the ED upon further deterioration. Patient is ready to go home.  Written by Tanna Savoy. [place], ED Scribe, as dictated by Coralie Common, MD.

## 2012-01-25 MED ORDER — DIAZEPAM 10 MG TAB
10 mg | ORAL_TABLET | ORAL | Status: DC
Start: 2012-01-25 — End: 2012-03-20

## 2012-01-25 NOTE — Telephone Encounter (Signed)
Call in thanks

## 2012-01-25 NOTE — Telephone Encounter (Signed)
Rx for Valium phoned in.

## 2012-03-20 NOTE — Telephone Encounter (Signed)
Phone in

## 2012-03-20 NOTE — Telephone Encounter (Signed)
Phoned in per [facility] Dr Darl Pikes

## 2012-03-20 NOTE — Telephone Encounter (Signed)
Ok to call in thanks

## 2012-03-27 ENCOUNTER — Encounter

## 2012-04-21 NOTE — Progress Notes (Signed)
"  REVIEWED RECORD IN PREPARATION FOR VISIT AND HAVE OBTAINED NECESSARY DOCUMENTATION"

## 2012-04-21 NOTE — Progress Notes (Signed)
HISTORY OF PRESENT ILLNESS  Lindsay [name] is a 56 y.o. female.  HPI  Follow up.    1.  Lindsay [name] had a fall in June and fractured and dislocated her left shoulder.  She is working with Dr. Jettie Booze and in physical therapy now.  Range of motion is only gradually resolving.  She has needed no pain pills currently.  2.  Insomnia.  Using Valium 10 mg at night and sometimes half in the morning.  Dr. Salley Hews still follows her and prescribes her Wellbutrin and Lexapro.  3.  Recent headaches and congestion, right side of face.  No fevers or purulent phlegm.    MedDATA/mth      Review of Systems   Constitutional: Negative.  Negative for fever, chills, malaise/fatigue and diaphoresis.   HENT: Positive for congestion. Negative for ear pain, nosebleeds, sore throat and ear discharge.    Eyes: Negative for pain and discharge.   Respiratory: Negative for cough, hemoptysis, sputum production, shortness of breath and wheezing.    Cardiovascular: Negative for chest pain.   Musculoskeletal: Positive for joint pain and falls.   Neurological: Positive for headaches.   Endo/Heme/Allergies: Positive for environmental allergies.       Physical Exam   Nursing note and vitals reviewed.  Constitutional: She appears well-developed and well-nourished.   HENT:   Head: Normocephalic.   Right Ear: Tympanic membrane, external ear and ear canal normal.   Left Ear: Tympanic membrane, external ear and ear canal normal.   Nose: Nose normal.   Mouth/Throat: Oropharynx is clear and moist and mucous membranes are normal. No oropharyngeal exudate.   Eyes: Conjunctivae are normal. Pupils are equal, round, and reactive to light. Right eye exhibits no discharge. Left eye exhibits no discharge.   Neck: Normal range of motion. Neck supple.   Cardiovascular: Normal rate, regular rhythm and normal heart sounds.    Pulmonary/Chest: Effort normal and breath sounds normal. No respiratory distress. She has no wheezes. She has no rales.   Musculoskeletal:   Dec rom of  left shoulder   Lymphadenopathy:     She has no cervical adenopathy.       ASSESSMENT and PLAN  Lindsay [name] was seen today for follow-up.    Diagnoses and associated orders for this visit:    Insomnia-disregard recent refill fro q 6 hours ( this was an error when computers were down) and use as below  - diazepam (VALIUM) 10 mg tablet; 10 mq qhs and 5 mg in am prn    Depression-sees dr spanier and stable    Shoulder dislocation    Nasal congestion  - fluticasone (FLONASE) 50 mcg/actuation nasal spray; 2 puff each qd    Dyslipidemia  - LIPID PANEL  - METABOLIC PANEL, COMPREHENSIVE

## 2012-04-21 NOTE — Telephone Encounter (Signed)
Called pharmacy back clarifying the Diazepam prescription called in for patient. Pharmacist voiced understanding of the clarification.

## 2012-04-21 NOTE — Telephone Encounter (Signed)
Called in patient's Diazepam per Dr. Darl Pikes to patient's local pharmacy.

## 2012-04-21 NOTE — Telephone Encounter (Signed)
Please call in thanks

## 2012-04-21 NOTE — Telephone Encounter (Signed)
[facility] called and they needs clarification on rx that was called in

## 2012-04-24 NOTE — Telephone Encounter (Signed)
Valium phoned in per [facility] Dr Darl Pikes

## 2012-11-28 ENCOUNTER — Encounter

## 2012-11-28 NOTE — Telephone Encounter (Signed)
Patient hand broke bone in her wrist. Would like for you to call in 2 of her Dizaepam. She has made appointment to see Dr Darl Pikes on Thursday May 29,2014

## 2012-11-28 NOTE — Telephone Encounter (Signed)
Call in thanks

## 2012-11-28 NOTE — Telephone Encounter (Signed)
Patient has a scheduled appointment for this Thursday.

## 2012-11-30 NOTE — Progress Notes (Signed)
HISTORY OF PRESENT ILLNESS  Lindsay [name] is a 57 y.o. female.  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 She has been exhausted with the wedding prep. She slipped on a wedding supply this weekend and landed on her left arm and leg.she did see dr garnett yesterday who diagnosed a hairline fracture in her left arm near the radius-she sees him again next week and put her in an immobilzer for this week. She got vicodin and motrin 600 mg for the pain. Tolerating the motrin so far no gi upset.   She is using the valium 10 mg 1 qhs and doing ok with this.has order for 5 mg in day prn  Sees psychiatry ofr the lexapro and wellbutrin   Review of Systems   Constitutional: Positive for malaise/fatigue. Negative for fever, chills and weight loss.   Respiratory: Negative for cough and shortness of breath.    Cardiovascular: Negative for chest pain.   Gastrointestinal: Negative for nausea, vomiting and abdominal pain.   Musculoskeletal: Positive for myalgias, joint pain and falls.   Neurological: Positive for headaches. Negative for sensory change and focal weakness.   Psychiatric/Behavioral: Negative for depression. The patient is not nervous/anxious and does not have insomnia.        Physical Exam   Nursing note and vitals reviewed.  Constitutional: She is oriented to person, place, and time. She appears well-developed and well-nourished.   HENT:   Head: Normocephalic and atraumatic.   Neck: Normal range of motion. Neck supple. Carotid bruit is not present. No thyromegaly present.   Cardiovascular: Normal rate, regular rhythm, S1 normal, S2 normal, normal heart sounds and intact distal pulses.    No murmur heard.  Pulmonary/Chest: Effort normal and breath sounds normal. No respiratory distress. She has no wheezes. She has no rales.   Musculoskeletal: She exhibits no edema.   L;eft wrist soft tissue swelling   Neurological: She is alert and oriented to person, place, and time.   Psychiatric: She has a normal mood and  affect. Her behavior is normal.       ASSESSMENT and PLAN  Lindsay [name] was seen today for medication evaluation.    Diagnoses and associated orders for this visit:    Insomnia  - diazepam (VALIUM) 10 mg tablet; 10 mq qhs and 5 mg in am prn    Left arm pain  With ? Hairline fracture seeing dr garnett  Check a dexa  Headache-encouraged neuro appt  - METABOLIC PANEL, COMPREHENSIVE  - CBC WITH AUTOMATED DIFF  - LIPID PANEL  - TSH, 3RD GENERATION    Arm fracture  - DEXA BONE DENSITY STUDY AXIAL; Future  - VITAMIN D, 25-HYDROXY, TOTAL

## 2012-11-30 NOTE — Progress Notes (Signed)
"  REVIEWED RECORD IN PREPARATION FOR VISIT AND HAVE OBTAINED NECESSARY DOCUMENTATION"

## 2012-12-07 LAB — CBC WITH AUTOMATED DIFF
ABS. BASOPHILS: 0 10*3/uL (ref 0.0–0.2)
ABS. EOSINOPHILS: 0.1 10*3/uL (ref 0.0–0.4)
ABS. IMM. GRANS.: 0 10*3/uL (ref 0.0–0.1)
ABS. MONOCYTES: 0.4 10*3/uL (ref 0.1–0.9)
ABS. NEUTROPHILS: 2.9 10*3/uL (ref 1.4–7.0)
Abs Lymphocytes: 1.6 10*3/uL (ref 0.7–3.1)
BASOPHILS: 0 % (ref 0–3)
EOSINOPHILS: 3 % (ref 0–5)
HCT: 34.2 % (ref 34.0–46.6)
HGB: 11.5 g/dL (ref 11.1–15.9)
IMMATURE GRANULOCYTES: 0 % (ref 0–2)
Lymphocytes: 32 % (ref 14–46)
MCH: 27.3 pg (ref 26.6–33.0)
MCHC: 33.6 g/dL (ref 31.5–35.7)
MCV: 81 fL (ref 79–97)
MONOCYTES: 7 % (ref 4–12)
NEUTROPHILS: 58 % (ref 40–74)
PLATELET: 272 10*3/uL (ref 155–379)
RBC: 4.21 x10E6/uL (ref 3.77–5.28)
RDW: 14.4 % (ref 12.3–15.4)
WBC: 5 10*3/uL (ref 3.4–10.8)

## 2012-12-07 LAB — METABOLIC PANEL, COMPREHENSIVE
A-G Ratio: 2 (ref 1.1–2.5)
ALT (SGPT): 14 IU/L (ref 0–32)
AST (SGOT): 23 IU/L (ref 0–40)
Albumin: 4.4 g/dL (ref 3.5–5.5)
Alk. phosphatase: 117 IU/L (ref 39–117)
BUN/Creatinine ratio: 17 (ref 9–23)
BUN: 13 mg/dL (ref 6–24)
Bilirubin, total: 0.2 mg/dL (ref 0.0–1.2)
CO2: 22 mmol/L (ref 19–28)
Calcium: 9 mg/dL (ref 8.7–10.2)
Chloride: 104 mmol/L (ref 97–108)
Creatinine: 0.76 mg/dL (ref 0.57–1.00)
GFR est AA: 99 mL/min/{1.73_m2} (ref >59)
GFR est non-AA: 86 mL/min/{1.73_m2} (ref >59)
GLOBULIN, TOTAL: 2.2 g/dL (ref 1.5–4.5)
Glucose: 99 mg/dL (ref 65–99)
Potassium: 4.7 mmol/L (ref 3.5–5.2)
Protein, total: 6.6 g/dL (ref 6.0–8.5)
Sodium: 140 mmol/L (ref 134–144)

## 2012-12-07 LAB — CVD REPORT: PDF IMAGE: 0

## 2012-12-07 LAB — VITAMIN D, 25-HYDROXY, TOTAL: Vitamin D 25-Hydroxy: 26 ng/mL — ABNORMAL LOW

## 2012-12-07 LAB — TSH 3RD GENERATION: TSH: 2.05 u[IU]/mL (ref 0.450–4.500)

## 2012-12-07 LAB — LIPID PANEL
Cholesterol, total: 235 mg/dL — ABNORMAL HIGH (ref 100–199)
HDL Cholesterol: 59 mg/dL (ref >39)
LDL, calculated: 139 mg/dL — ABNORMAL HIGH (ref 0–99)
Triglyceride: 183 mg/dL — ABNORMAL HIGH (ref 0–149)
VLDL, calculated: 37 mg/dL (ref 5–40)

## 2012-12-11 NOTE — Progress Notes (Signed)
Quick Note:    Notified by letter.    ______

## 2012-12-26 ENCOUNTER — Inpatient Hospital Stay
Admit: 2012-12-26 | Discharge: 2012-12-28 | Disposition: A | Discharge status: Home / Self Care | Payer: BLUE CROSS/BLUE SHIELD | Attending: Internal Medicine | Admitting: Internal Medicine

## 2012-12-26 DIAGNOSIS — I2699 Other pulmonary embolism without acute cor pulmonale: Secondary | ICD-10-CM

## 2012-12-26 LAB — CBC WITH AUTOMATED DIFF
ABS. BASOPHILS: 0 10*3/uL (ref 0.0–0.1)
ABS. EOSINOPHILS: 0.1 10*3/uL (ref 0.0–0.4)
ABS. LYMPHOCYTES: 1.2 10*3/uL (ref 0.8–3.5)
ABS. MONOCYTES: 0.5 10*3/uL (ref 0.0–1.0)
ABS. NEUTROPHILS: 7.2 10*3/uL (ref 1.8–8.0)
BASOPHILS: 0 % (ref 0–1)
EOSINOPHILS: 2 % (ref 0–7)
HCT: 34.5 % — ABNORMAL LOW (ref 35.0–47.0)
HGB: 11.2 g/dL — ABNORMAL LOW (ref 11.5–16.0)
LYMPHOCYTES: 14 % (ref 12–49)
MCH: 27.7 PG (ref 26.0–34.0)
MCHC: 32.5 g/dL (ref 30.0–36.5)
MCV: 85.4 FL (ref 80.0–99.0)
MONOCYTES: 6 % (ref 5–13)
NEUTROPHILS: 78 % — ABNORMAL HIGH (ref 32–75)
PLATELET: 210 10*3/uL (ref 150–400)
RBC: 4.04 M/uL (ref 3.80–5.20)
RDW: 13.5 % (ref 11.5–14.5)
WBC: 9.1 10*3/uL (ref 3.6–11.0)

## 2012-12-26 NOTE — Progress Notes (Signed)
TRANSFER - IN REPORT:    Verbal report received from Amy RN(name) on Lindsay [name]  being received from ED(unit) for routine progression of care      Report consisted of patient???s Situation, Background, Assessment and   Recommendations(SBAR).     Information from the following report(s) SBAR, Kardex, ED Summary, [facility] and Recent Results was reviewed with the receiving nurse.    Opportunity for questions and clarification was provided.      Assessment completed upon patient???s arrival to unit and care assumed.

## 2012-12-26 NOTE — ED Notes (Signed)
Bedside and Verbal shift change report given to A.Fleming-Hoyt,RN (Cabin crew) by Archer Asa (offgoing nurse).  Report given with SBAR, ED Summary, MAR and Recent Results.   Pt moved from [place] [facility] into to Room 10 and placed on the monitor x 3, Dr T.Smith at the bedside to update patient on plan with a blood clot to the Right Lung. Call bell in reach. Family at the bedside.

## 2012-12-26 NOTE — H&P (Signed)
Hospitalist Admission Note    NAME: Lindsay [name]   DOB:  [birth year]   MRN:  [id number]     Date/Time:  12/26/2012 10:52 PM    Patient PCP: Clarene Critchley, MD  ________________________________________________________________________    My assessment of this patient's clinical condition and my plan of care is as follows.    Assessment / Plan:     Acute Pulmonary Embolism  Suspect LE DVT  --CTA reviewed with the patient.  She still has L>R ankle swelling so will request leg dopplers for the morning.  --no other clear risk factors other than her initial fall and subsequent immobility x 2 days.   She is not on HRT, no recent travel and no personal history of cancer.    --start on Lovenox.   I discussed the option to use xarelto and will have the case manager check if her insurance will cover this.   If not, we can start coumadin tomorrow.        Pleuritic type Chest pains due to PE  --percocet and morphine prn  --try couple of doses of toradol     Depression  --continue her psych meds     Recent Left wrist fracture  --she took off her splint as it has been already 4 weeks                    Code Status: Full  DVT Prophylaxis: Lovenox  GI Prophylaxis: NI             Subjective:   CHIEF COMPLAINT:   Chest pains    HISTORY OF PRESENT ILLNESS:     Lindsay [name] is a 57 y.o.  Caucasian female who presents with chest pains and is found to have a PE.    Patient reports falling on 5/26 and sustaining a left wrist fracture.  A brace was applied and she recalls staying in bed for a couple of days after that.   A week later she experienced bilateral ankle pain and swelling.  She presented to [facility] and Lyme's disease was suspected (due to a spot/rash on one of her legs).  She was prescribed doxycycline X 1 month but she had a hard time tolerating this.   It made her vomit at times and her leg swelling and discomfort would continue to come and go.   Last week she experienced right chest pains that was worse with  inspiration.  She initially thought it may be related to her vomiting but presented to [facility] today due to it's persistence.   She was referred to this ER and a CTA subsequently was done and demonstrates a right sided PE.       We were asked to admit for work up and evaluation of the above problems.     Past Medical History   Diagnosis Date   ??? Headache 2011   ??? Depression    ??? Depression       Past Surgical History   Procedure Laterality Date   ??? Hx other surgical  2010     left foot torn tendon   ??? Hx cesarean section     ??? Hx appendectomy     ??? Hx orthopaedic       rotater cuff r. shoulder   ??? Hx orthopaedic       left ankle fracture treated with air cast   ??? Hx orthopaedic  6/13     left shoulder fxt and dislocation  History   Substance Use Topics   ??? Smoking status: Never Smoker    ??? Smokeless tobacco: Never Used   ??? Alcohol Use: No      Family History   Problem Relation Age of Onset   ??? Heart Disease Mother    ??? Heart Disease Father      No Known Allergies   Prior to Admission medications    Medication Sig Start Date End Date Taking? Authorizing Provider   doxycycline (MONODOX) 100 mg capsule Take 100 mg by mouth two (2) times a day. Pt states she is on a 2 week course for prophylactic possible Lymes Disease   Yes Phys Other, MD   diazepam (VALIUM) 10 mg tablet 10 mq qhs and 5 mg in am prn 11/30/12  Yes Clarene Critchley, MD   fluticasone [facility]) 50 mcg/actuation nasal spray 2 puff each qd 04/21/12  Yes Clarene Critchley, MD   omeprazole (PRILOSEC) 20 mg capsule Take 20 mg by mouth daily.     Yes Historical Provider   buPROPion SR [facility] SR) 150 mg SR tablet Take 300 mg by mouth daily. 10/15/09  Yes Historical Provider   escitalopram (LEXAPRO) 10 mg tablet take 10 mg by mouth daily.   Yes Historical Provider       REVIEW OF SYSTEMS:     [] Unable to obtain  ROS due to  [] mental status change  [] sedated   [] intubated   [x] Total of 12 systems reviewed as follows:  Constitutional: negative fever,  negative chills, negative weight loss  Eyes:   negative visual changes  ENT:   negative sore throat, tongue or lip swelling  Respiratory:  negative cough, negative dyspnea  Cards:  positive for chest pain, and lower extremity edema  GI:   positive for nausea, vomiting, negative diarrhea, and abdominal pain  Genitourinary: negative for frequency, dysuria  Integument:  negative for rash   Hematologic:  negative for easy bruising and gum/nose bleeding  Musculoskel: negative for myalgias,  back pain  Neurological:  negative for headaches, dizziness, vertigo, weakness  Behavl/Psych: negative for feelings of anxiety, depression         Objective:   VITALS:    Visit Vitals   Item Reading   ??? BP 115/67   ??? Pulse 81   ??? Temp 99.3 ??F (37.4 ??C)   ??? Resp 20   ??? Ht 5\' 3"  (1.6 m)   ??? Wt 70.308 kg (155 lb)   ??? BMI 27.46 kg/m2   ??? SpO2 94%     PHYSICAL EXAM:    General:    Alert, cooperative, no distress, appears stated age.     HEENT: Atraumatic, anicteric sclerae, pink conjunctivae     No oral ulcers, mucosa moist, throat clear  Neck:  Supple, symmetrical,  thyroid: non tender  Lungs:   Clear to auscultation bilaterally.  No Wheezing or Rhonchi. No rales.  Chest wall:  No tenderness  No Accessory muscle use.  Heart:   Regular  rhythm,  No  murmur   (+) left ankle edema  Abdomen:   Soft, non-tender. Not distended.  Bowel sounds normal  Extremities: No cyanosis.  No clubbing  Skin:     Not pale.  Not Jaundiced  No rashes   Psych:  Good insight.  Not depressed.  Not anxious or agitated.  Neurologic: EOMs intact. No facial asymmetry. No aphasia or slurred speech. Symmetrical strength, Alert and oriented X 3.     ________________________________________________________________________  Care  Plan discussed with:    Comments   Patient y    [facility]:      ___________________________________________________________  Recommended Disposition:   Home with Family y   HH/PT/OT/RN    SNF/LTC     [facility]    ________________________________________________________________________  Code Status:  Full Code y   DNR/DNI    ________________________________________________________________________  TOTAL TIME:  60    Comments   >50% of visit spent in counseling and coordination of care       Critical Care Provided     Minutes non procedure based  ________________________________________________________________________  Pamalee Leyden, MD      Procedures: see electronic medical records for all procedures/Xrays and details which were not copied into this note but were reviewed prior to creation of Plan.    LAB DATA REVIEWED:    Recent Results (from the past 24 hour(s))   EKG, 12 LEAD, INITIAL    Collection Time     12/26/12  7:09 PM       Result Value Range    Ventricular Rate 75      Atrial Rate 75      P-R Interval 126      QRS Duration 80      Q-T Interval 404      QTC Calculation (Bezet) 451      Calculated P Axis 0      Calculated R Axis 2      Calculated T Axis 35      Diagnosis        Value: Normal sinus rhythm      Normal ECG      When compared with ECG of 31-Oct-2008 15:06,      Questionable change in QRS axis   METABOLIC PANEL, COMPREHENSIVE    Collection Time     12/26/12  7:45 PM       Result Value Range    Sodium 141  136 - 145 mmol/L    Potassium 3.9  3.5 - 5.1 mmol/L    Chloride 106  97 - 108 mmol/L    CO2 28  21 - 32 mmol/L    Anion gap 7  5 - 15 mmol/L    Glucose 89  65 - 100 mg/dL    BUN 15  6 - 20 MG/DL    Creatinine 1.61  0.96 - 1.15 MG/DL    BUN/Creatinine ratio 30 (*) 12 - 20      GFR est AA >60  >60 ml/min/1.62m2    GFR est non-AA >60  >60 ml/min/1.73m2    Calcium 8.9  8.5 - 10.1 MG/DL    Bilirubin, total 0.3  0.2 - 1.0 MG/DL    ALT 18  12 - 78 U/L    AST 17  15 - 37 U/L    Alk. phosphatase 151 (*) 45 - 117 U/L    Protein, total 7.4  6.4 - 8.2 g/dL    Albumin 3.7  3.5 - 5.0 g/dL    Globulin 3.7  2.0 - 4.0 g/dL    A-G Ratio 1.0 (*) 1.1 - 2.2     CBC WITH AUTOMATED DIFF    Collection Time      12/26/12  7:45 PM       Result Value Range    WBC 9.1  3.6 - 11.0 K/uL    RBC 4.04  3.80 - 5.20 M/uL    HGB 11.2 (*) 11.5 - 16.0 g/dL    HCT 09.8 (*) 11.9 - 47.0 %    MCV 85.4  80.0 - 99.0 FL    MCH 27.7  26.0 - 34.0 PG    MCHC 32.5  30.0 - 36.5 g/dL    RDW 14.7  82.9 - 56.2 %    PLATELET 210  150 - 400 K/uL    NEUTROPHILS 78 (*) 32 - 75 %    LYMPHOCYTES 14  12 - 49 %    MONOCYTES 6  5 - 13 %    EOSINOPHILS 2  0 - 7 %    BASOPHILS 0  0 - 1 %    ABS. NEUTROPHILS 7.2  1.8 - 8.0 K/UL    ABS. LYMPHOCYTES 1.2  0.8 - 3.5 K/UL    ABS. MONOCYTES 0.5  0.0 - 1.0 K/UL    ABS. EOSINOPHILS 0.1  0.0 - 0.4 K/UL    ABS. BASOPHILS 0.0  0.0 - 0.1 K/UL   CK    Collection Time     12/26/12  7:45 PM       Result Value Range    CK 109  26 - 192 U/L   TROPONIN I    Collection Time     12/26/12  7:45 PM       Result Value Range    Troponin-I, Qt. <0.04  <0.05 ng/mL   D DIMER    Collection Time     12/26/12  7:45 PM       Result Value Range    D-dimer 1.71 (*) 0.00 - 0.65 mg/L FEU   PROTHROMBIN TIME + INR    Collection Time     12/26/12  7:45 PM       Result Value Range    INR 1.0  0.9 - 1.1      Prothrombin time 10.2  9.4 - 11.7 sec   PTT    Collection Time     12/26/12  7:45 PM       Result Value Range    aPTT 26.8  22.1 - 30.0 sec    aPTT, therapeutic range      58.0 - 77.0 SECS   LIPASE    Collection Time     12/26/12  7:45 PM       Result Value Range    Lipase 223  73 - 393 U/L

## 2012-12-26 NOTE — ED Notes (Signed)
Patient arrives to [facility] bed 2 by EMS with c/o's of chest pain that initially started Friday, Per EMS patient was at [facility] when they picked her up. Pt has reported increased chest pain worsening with taking deep breaths. Current Vitals en route. BP 114/68, HR 73, RR 18, O2 sats 95% on RA

## 2012-12-26 NOTE — ED Notes (Signed)
TRANSFER - OUT REPORT:    Verbal report given to Megan,RN(name) on Lindsay [name]  being transferred to 2214(unit) for routine progression of care       Report consisted of patient???s Situation, Background, Assessment and   Recommendations(SBAR).     Information from the following report(s) SBAR, ED Summary, Intake/Output, MAR and Recent Results was reviewed with the receiving nurse.    Opportunity for questions and clarification was provided.        Pt informed of RN to assume care and room to be assigned to.

## 2012-12-26 NOTE — ED Provider Notes (Signed)
HPI Comments: Lindsay [name] is a 57 y.o. female who presents via EMS to [facility] ED with cc of constant, gradually worsening 6/10 lower R-sided chest wall pain x 12/22/12. Pt notes her chest wall pain is worsened by deep inspiration. Pt states her pain has been radiating to her R clavicle and R scapula x last night, but states her pain does not radiate to her neck or jaw. Pt also reports associated SOB and light-headedness x today, and BL ankle pain and swelling (L>R) x 1 week. Pt notes she has been taking doxycycline recently for lyme disease. Pt reports hx of appendectomy, and denies hx of cholecystectomy. Pt specifically denies F/C, diaphoresis, cough.    PCP: Clarene Critchley, MD  PMhx is significant for: Depression  PShx is significant for: Appendectomy, C-section, L foot surgery, R rotator cuff repair   Social hx: - Smoke, - EtOH, - Drugs    There are no other complaints, changes or physical findings at this time.  Written by Oretha Ellis, ED Scribe, as dictated by Neldon Newport, MD          The history is provided by the patient.        Past Medical History   Diagnosis Date   ??? Headache 2011   ??? Depression    ??? Depression         Past Surgical History   Procedure Laterality Date   ??? Hx other surgical  2010     left foot torn tendon   ??? Hx cesarean section     ??? Hx appendectomy     ??? Hx orthopaedic       rotater cuff r. shoulder   ??? Hx orthopaedic       left ankle fracture treated with air cast   ??? Hx orthopaedic  6/13     left shoulder fxt and dislocation         History reviewed. No pertinent family history.     History     Social History   ??? Marital Status: MARRIED     Spouse Name: N/A     Number of Children: N/A   ??? Years of Education: N/A     Occupational History   ??? Not on file.     Social History Main Topics   ??? Smoking status: Never Smoker    ??? Smokeless tobacco: Never Used   ??? Alcohol Use: No   ??? Drug Use: No   ??? Sexually Active: Yes -- Female partner(s)     Other Topics Concern   ??? Not on file      Social History Narrative   ??? No narrative on file                  ALLERGIES: Review of patient's allergies indicates no known allergies.      Review of Systems   Constitutional: Negative.  Negative for fever, chills and diaphoresis.   HENT: Negative.    Eyes: Negative.    Respiratory: Positive for shortness of breath. Negative for cough.    Cardiovascular: Positive for chest pain (lower R-sided) and leg swelling (BL ankles).   Gastrointestinal: Negative.    Endocrine: Negative for polyuria.   Genitourinary: Negative.    Musculoskeletal: Positive for arthralgias (BL ankles).   Skin: Negative.    Allergic/Immunologic: Negative for immunocompromised state.   Neurological: Positive for light-headedness.   Hematological: Negative.    Psychiatric/Behavioral: Negative.    All other systems reviewed and  are negative.        Filed Vitals:    12/26/12 1933 12/26/12 2102 12/26/12 2156 12/26/12 2200   BP: 110/66 118/77 114/66 115/67   Pulse: 76 75 79 81   Temp:       Resp: 20 20 20 20    Height: 5\' 3"  (1.6 m)      Weight: 70.308 kg (155 lb)      SpO2: 91% 95% 94% 94%            Physical Exam   Nursing note and vitals reviewed.  Constitutional: She is oriented to person, place, and time. She appears well-developed and well-nourished. She appears distressed (mildly).   HENT:   Head: Normocephalic and atraumatic.   Eyes: EOM are normal.   Neck: Normal range of motion.   Cardiovascular: Normal rate, regular rhythm and normal heart sounds.    Pulmonary/Chest: Effort normal and breath sounds normal. No respiratory distress. She exhibits tenderness (lower R chest wall).   Abdominal: Soft. Bowel sounds are normal. She exhibits no mass. There is tenderness in the right upper quadrant. There is CVA tenderness (R).   Musculoskeletal: Normal range of motion. She exhibits edema (BL ankles and feet).   Neurological: She is alert and oriented to person, place, and time. Coordination normal.   Skin: Skin is warm and dry.   Psychiatric: She  has a normal mood and affect.    Written by Oretha Ellis, ED scribe, as dictated by Neldon Newport, MD      MDM     Differential Diagnosis; Clinical Impression; Plan:     DDx: Pleurisy, CAD, PE, Pericarditis, Pancreatitis, Cholelithiasis   Amount and/or Complexity of Data Reviewed:   Clinical lab tests:  Ordered and reviewed  Tests in the radiology section of CPT??:  Ordered and reviewed  Tests in the medicine section of the CPT??:  Ordered and reviewed   Review and summarize past medical records:  Yes   Discuss the patient with another provider:  Yes (Hospitalist)   Independant visualization of image, tracing, or specimen:  Yes (EKG, CTA, XR)  Critical Care:   Total time providing critical care:  30-74 minutes (Excluding all other billable procedures.)  Progress:   Patient progress:  Stable      Procedures    EKG interpretation: (Preliminary)  Rhythm: normal sinus rhythm; and regular . Rate (approx.): 75; Axis: normal; P wave: normal; QRS interval: normal ; ST/T wave: normal; no significant changes from prior EKG.   Written by Oretha Ellis, ED scribe, as dictated by Neldon Newport, MD    PROGRESS NOTE:  9:44 PM  Pt has been reevaluated.  Pt states her pain has improved after morphine.   Written by Oretha Ellis, ED scribe, as dictated by Neldon Newport, MD    CONSULT NOTE:   10:02 PM  Neldon Newport, MD spoke with Dr. Sharyn Creamer,   Specialty: Hospitalist  Discussed pt's hx, disposition, and available diagnostic and imaging results. Reviewed care plans. Consultant will evaluate pt for admission.  Written by Oretha Ellis, ED Scribe, as dictated by Neldon Newport, MD.      LABORATORY TESTS:  Recent Results (from the past 12 hour(s))   EKG, 12 LEAD, INITIAL    Collection Time     12/26/12  7:09 PM       Result Value Range    Ventricular Rate 75      Atrial Rate 75      P-R Interval 126  QRS Duration 80      Q-T Interval 404      QTC Calculation (Bezet) 451      Calculated P Axis 0      Calculated R Axis 2       Calculated T Axis 35      Diagnosis        Value: Normal sinus rhythm      Normal ECG      When compared with ECG of 31-Oct-2008 15:06,      Questionable change in QRS axis   METABOLIC PANEL, COMPREHENSIVE    Collection Time     12/26/12  7:45 PM       Result Value Range    Sodium 141  136 - 145 mmol/L    Potassium 3.9  3.5 - 5.1 mmol/L    Chloride 106  97 - 108 mmol/L    CO2 28  21 - 32 mmol/L    Anion gap 7  5 - 15 mmol/L    Glucose 89  65 - 100 mg/dL    BUN 15  6 - 20 MG/DL    Creatinine 7.82  9.56 - 1.15 MG/DL    BUN/Creatinine ratio 30 (*) 12 - 20      GFR est AA >60  >60 ml/min/1.65m2    GFR est non-AA >60  >60 ml/min/1.62m2    Calcium 8.9  8.5 - 10.1 MG/DL    Bilirubin, total 0.3  0.2 - 1.0 MG/DL    ALT 18  12 - 78 U/L    AST 17  15 - 37 U/L    Alk. phosphatase 151 (*) 45 - 117 U/L    Protein, total 7.4  6.4 - 8.2 g/dL    Albumin 3.7  3.5 - 5.0 g/dL    Globulin 3.7  2.0 - 4.0 g/dL    A-G Ratio 1.0 (*) 1.1 - 2.2     CBC WITH AUTOMATED DIFF    Collection Time     12/26/12  7:45 PM       Result Value Range    WBC 9.1  3.6 - 11.0 K/uL    RBC 4.04  3.80 - 5.20 M/uL    HGB 11.2 (*) 11.5 - 16.0 g/dL    HCT 21.3 (*) 08.6 - 47.0 %    MCV 85.4  80.0 - 99.0 FL    MCH 27.7  26.0 - 34.0 PG    MCHC 32.5  30.0 - 36.5 g/dL    RDW 57.8  46.9 - 62.9 %    PLATELET 210  150 - 400 K/uL    NEUTROPHILS 78 (*) 32 - 75 %    LYMPHOCYTES 14  12 - 49 %    MONOCYTES 6  5 - 13 %    EOSINOPHILS 2  0 - 7 %    BASOPHILS 0  0 - 1 %    ABS. NEUTROPHILS 7.2  1.8 - 8.0 K/UL    ABS. LYMPHOCYTES 1.2  0.8 - 3.5 K/UL    ABS. MONOCYTES 0.5  0.0 - 1.0 K/UL    ABS. EOSINOPHILS 0.1  0.0 - 0.4 K/UL    ABS. BASOPHILS 0.0  0.0 - 0.1 K/UL   CK    Collection Time     12/26/12  7:45 PM       Result Value Range    CK 109  26 - 192 U/L   TROPONIN I    Collection Time  12/26/12  7:45 PM       Result Value Range    Troponin-I, Qt. <0.04  <0.05 ng/mL   D DIMER    Collection Time     12/26/12  7:45 PM       Result Value Range    D-dimer 1.71 (*) 0.00 - 0.65  mg/L FEU   PROTHROMBIN TIME + INR    Collection Time     12/26/12  7:45 PM       Result Value Range    INR 1.0  0.9 - 1.1      Prothrombin time 10.2  9.4 - 11.7 sec   PTT    Collection Time     12/26/12  7:45 PM       Result Value Range    aPTT 26.8  22.1 - 30.0 sec    aPTT, therapeutic range      58.0 - 77.0 SECS   LIPASE    Collection Time     12/26/12  7:45 PM       Result Value Range    Lipase 223  73 - 393 U/L       IMAGING RESULTS:   CTA CHEST W WO CONT (Final result)  Result time: 12/26/12 21:40:41      Final result by Rad Results In Edi (12/26/12 21:40:41)      Narrative:    **Final Report**      ICD Codes / Adm.Diagnosis: 161096 786.50 / Chest Pain Chest pain  Examination: CT CHEST ANGIOGRAPHY - [id number] - Dec 26 2012 9:16PM  Accession No: [id number]  Reason: Chest pain R/O PE      REPORT:  EXAM: CT CHEST ANGIOGRAPHY    INDICATION: Chest pain R/O PE      COMPARISON: CT chest of 10/31/2008    TECHNIQUE:  Precontrast scout images were obtained to localize the volume for   acquisition. Multislice helical CT arteriography was performed from the   diaphragm to the thoracic inlet during uneventful rapid bolus intravenous   administration of 95 mL of Optiray 350. Lung and soft tissue windows were   generated. Post processing was performed to include 3D and coronal   reformatted images.    FINDINGS:  The pulmonary arterial system is well-opacified. There is a large filling   defect in the right lower lobe pulmonary artery with extension into a few of   the segmental branches, consistent with acute pulmonary thromboembolism. No   other filling defects are demonstrated. The pulmonary artery is at the upper   limits of normal in size but unchanged since the previous study.   Calcification is present in the coronary arteries, particularly in the LAD.Marland Kitchen   No parenchymal nodules or masses are demonstrated. There is parenchymal   scarring in the right lower lobe. There is underaeration of the dependent   portion of the lungs.  The lungs are as otherwise clear. The aorta enhances   normally without evidence of aneurysm or dissection. There is no focal   airspace disease. There is no pleural or pericardial fluid. There are   bilateral breast implants.    No significant abnormalities are seen in the upper abdomen. There are no   significant bony abnormalities.      IMPRESSION:  1. Acute pulmonary thromboembolism in the right lower lobe pulmonary artery   with extension into proximal segmental branches.  2. Coronary artery calcification. The findings were called to Dr. Katrinka Blazing on   12/26/2012 at 21:38 hours by  Dr. Caren Griffins. [phone]              Signing/Reading Doctor: Richardson Landry [phone])   Approved: Richardson Landry [phone]) Dec 26 2012 9:38PM            MEDICATIONS GIVEN:  Medications   sodium chloride (NS) flush 5-10 mL (not administered)   morphine injection 2 mg (not administered)   morphine injection 2 mg (2 mg IntraVENous Given 12/26/12 2100)   ondansetron (ZOFRAN) injection 4 mg (4 mg IntraVENous Given 12/26/12 2100)   ioversol (OPTIRAY) 350 mg iodine/mL contrast solution 100 mL (100 mL IntraVENous Given 12/26/12 2229)   sodium chloride (NS) flush 10 mL (10 mL IntraVENous Given 12/26/12 2229)   0.9% sodium chloride infusion (50 mL/hr IntraVENous Given 12/26/12 2100)   enoxaparin (LOVENOX) injection 70 mg (70 mg SubCUTAneous Given 12/26/12 2221)       IMPRESSION:  1. Pulmonary embolism        PLAN:  1. Admit    ADMIT NOTE:  11:06 PM  Patient is being admitted to the hospital.  The results of their tests and reasons for their admission have been discussed with them and/or available family.  They convey agreement and understanding for the need to be admitted and for their admission diagnosis.  Consultation has been made with the inpatient physician specialist for hospitalization.  Written by Oretha Ellis, ED Scribe, as dictated by Neldon Newport, MD

## 2012-12-26 NOTE — ED Notes (Signed)
Pt report attempted,RN to return care.

## 2012-12-26 NOTE — ED Notes (Signed)
Pt given a hot healthy choice,beverage and jello. Family at the bedside.

## 2012-12-26 NOTE — ED Notes (Signed)
Patient reports mild relief of pain after medication given, states, "about the same"

## 2012-12-27 LAB — D-DIMER, QUANTITATIVE: D-Dimer, Quant: 1.71 mg/L FEU — ABNORMAL HIGH (ref 0.00–0.65)

## 2012-12-27 LAB — EKG, 12 LEAD, INITIAL
Atrial Rate: 75 {beats}/min
Calculated P Axis: 0 degrees
Calculated R Axis: 2 degrees
Calculated T Axis: 35 degrees
Diagnosis: NORMAL
P-R Interval: 126 ms
Q-T Interval: 404 ms
QRS Duration: 80 ms
QTC Calculation (Bezet): 451 ms
Ventricular Rate: 75 {beats}/min

## 2012-12-27 LAB — METABOLIC PANEL, BASIC
Anion gap: 7 mmol/L (ref 5–15)
BUN/Creatinine ratio: 18 (ref 12–20)
BUN: 13 MG/DL (ref 6–20)
CO2: 29 mmol/L (ref 21–32)
Calcium: 8.3 MG/DL — ABNORMAL LOW (ref 8.5–10.1)
Chloride: 105 mmol/L (ref 97–108)
Creatinine: 0.71 MG/DL (ref 0.45–1.15)
GFR est AA: >60 mL/min/{1.73_m2} (ref >60)
GFR est non-AA: >60 mL/min/{1.73_m2} (ref >60)
Glucose: 97 mg/dL (ref 65–100)
Potassium: 3.8 mmol/L (ref 3.5–5.1)
Sodium: 141 mmol/L (ref 136–145)

## 2012-12-27 LAB — METABOLIC PANEL, COMPREHENSIVE
A-G Ratio: 1 — ABNORMAL LOW (ref 1.1–2.2)
ALT (SGPT): 18 U/L (ref 12–78)
AST (SGOT): 17 U/L (ref 15–37)
Albumin: 3.7 g/dL (ref 3.5–5.0)
Alk. phosphatase: 151 U/L — ABNORMAL HIGH (ref 45–117)
Anion gap: 7 mmol/L (ref 5–15)
BUN/Creatinine ratio: 30 — ABNORMAL HIGH (ref 12–20)
BUN: 15 MG/DL (ref 6–20)
Bilirubin, total: 0.3 MG/DL (ref 0.2–1.0)
CO2: 28 mmol/L (ref 21–32)
Calcium: 8.9 MG/DL (ref 8.5–10.1)
Chloride: 106 mmol/L (ref 97–108)
Creatinine: 0.5 MG/DL (ref 0.45–1.15)
GFR est AA: >60 mL/min/{1.73_m2} (ref >60)
GFR est non-AA: >60 mL/min/{1.73_m2} (ref >60)
Globulin: 3.7 g/dL (ref 2.0–4.0)
Glucose: 89 mg/dL (ref 65–100)
Potassium: 3.9 mmol/L (ref 3.5–5.1)
Protein, total: 7.4 g/dL (ref 6.4–8.2)
Sodium: 141 mmol/L (ref 136–145)

## 2012-12-27 LAB — CBC WITH AUTOMATED DIFF
ABS. BASOPHILS: 0 10*3/uL (ref 0.0–0.1)
ABS. EOSINOPHILS: 0.1 10*3/uL (ref 0.0–0.4)
ABS. LYMPHOCYTES: 2.2 10*3/uL (ref 0.8–3.5)
ABS. MONOCYTES: 0.8 10*3/uL (ref 0.0–1.0)
ABS. NEUTROPHILS: 4.8 10*3/uL (ref 1.8–8.0)
BASOPHILS: 0 % (ref 0–1)
EOSINOPHILS: 2 % (ref 0–7)
HCT: 31 % — ABNORMAL LOW (ref 35.0–47.0)
HGB: 9.8 g/dL — ABNORMAL LOW (ref 11.5–16.0)
LYMPHOCYTES: 27 % (ref 12–49)
MCH: 26.8 PG (ref 26.0–34.0)
MCHC: 31.6 g/dL (ref 30.0–36.5)
MCV: 84.9 FL (ref 80.0–99.0)
MONOCYTES: 10 % (ref 5–13)
NEUTROPHILS: 61 % (ref 32–75)
PLATELET: 185 10*3/uL (ref 150–400)
RBC: 3.65 M/uL — ABNORMAL LOW (ref 3.80–5.20)
RDW: 13.6 % (ref 11.5–14.5)
WBC: 7.9 10*3/uL (ref 3.6–11.0)

## 2012-12-27 LAB — D DIMER: D-dimer: 1.71 mg/L FEU — ABNORMAL HIGH (ref 0.00–0.65)

## 2012-12-27 LAB — TROPONIN I: Troponin-I, Qt.: <0.04 ng/mL (ref <0.05)

## 2012-12-27 LAB — C REACTIVE PROTEIN, QT: C-Reactive protein: 5.02 mg/dL — ABNORMAL HIGH (ref 0.00–0.60)

## 2012-12-27 LAB — PTT: aPTT: 26.8 s (ref 22.1–30.0)

## 2012-12-27 LAB — PROTHROMBIN TIME + INR
INR: 1 (ref 0.9–1.1)
Prothrombin time: 10.2 s (ref 9.4–11.7)

## 2012-12-27 LAB — HOMOCYSTEINE, PLASMA: Homocysteine, plasma: 9.8 umol/L (ref 3.7–13.9)

## 2012-12-27 LAB — CK: CK: 109 U/L (ref 26–192)

## 2012-12-27 LAB — LIPASE: Lipase: 223 U/L (ref 73–393)

## 2012-12-27 MED ADMIN — enoxaparin (LOVENOX) 70 mg: SUBCUTANEOUS | @ 15:00:00 | NDC 00075062430

## 2012-12-27 MED ADMIN — enoxaparin (LOVENOX) injection 70 mg: SUBCUTANEOUS | @ 02:00:00 | NDC 00075801401

## 2012-12-27 MED ADMIN — rivaroxaban (XARELTO) tablet 15 mg: ORAL | @ 21:00:00 | NDC 50458057810

## 2012-12-27 MED ADMIN — sodium chloride (NS) flush 5-10 mL: INTRAVENOUS | @ 21:00:00 | NDC 87701099893

## 2012-12-27 MED ADMIN — ioversol (OPTIRAY) 350 mg iodine/mL contrast solution 100 mL: INTRAVENOUS | @ 02:00:00 | NDC 00019133311

## 2012-12-27 MED ADMIN — ondansetron (ZOFRAN) injection 4 mg: INTRAVENOUS | @ 01:00:00 | NDC 00641607801

## 2012-12-27 MED ADMIN — ketorolac (TORADOL) injection 15 mg: INTRAVENOUS | @ 11:00:00 | NDC 00409379501

## 2012-12-27 MED ADMIN — ketorolac (TORADOL) injection 15 mg: INTRAVENOUS | @ 05:00:00 | NDC 00409379501

## 2012-12-27 MED ADMIN — escitalopram oxalate (LEXAPRO) tablet 10 mg: ORAL | @ 05:00:00 | NDC 51079054301

## 2012-12-27 MED ADMIN — acetaminophen (TYLENOL) tablet 650 mg: ORAL | @ 21:00:00 | NDC 00904198261

## 2012-12-27 MED ADMIN — enoxaparin (LOVENOX) 70 mg: SUBCUTANEOUS | @ 13:00:00

## 2012-12-27 MED ADMIN — 0.9% sodium chloride infusion: INTRAVENOUS | @ 01:00:00 | NDC 00409798309

## 2012-12-27 MED ADMIN — diazepam (VALIUM) tablet 10 mg: ORAL | @ 05:00:00 | NDC 51079028501

## 2012-12-27 MED ADMIN — rivaroxaban (XARELTO) tablet 15 mg: ORAL | @ 17:00:00 | NDC 50458057810

## 2012-12-27 MED ADMIN — sodium chloride (NS) flush 10 mL: INTRAVENOUS | NDC 87701099893

## 2012-12-27 MED ADMIN — morphine injection 2 mg: INTRAVENOUS | @ 21:00:00 | NDC 00409189001

## 2012-12-27 MED ADMIN — sodium chloride (NS) flush 10 mL: INTRAVENOUS | @ 02:00:00 | NDC 87701099893

## 2012-12-27 MED ADMIN — morphine injection 2 mg: INTRAVENOUS | @ 01:00:00 | NDC 00409176230

## 2012-12-27 MED ADMIN — ioversol (OPTIRAY) 350 mg iodine/mL contrast solution 100 mL: INTRAVENOUS | NDC 00019133395

## 2012-12-27 MED ADMIN — diazepam (VALIUM) tablet 2.5 mg: ORAL | @ 17:00:00 | NDC 51079028501

## 2012-12-27 MED ADMIN — sodium chloride (NS) flush 5-10 mL: INTRAVENOUS | @ 05:00:00 | NDC 87701099893

## 2012-12-27 MED ADMIN — buPROPion SR (WELLBUTRIN SR) tablet 300 mg: ORAL | @ 13:00:00 | NDC 68084047011

## 2012-12-27 MED ADMIN — sodium chloride (NS) flush 5-10 mL: INTRAVENOUS | @ 11:00:00 | NDC 87701099893

## 2012-12-27 MED ADMIN — escitalopram oxalate (LEXAPRO) tablet 10 mg: ORAL | @ 22:00:00 | NDC 51079054301

## 2012-12-27 MED FILL — MORPHINE 2 MG/ML INJECTION: 2 mg/mL | INTRAMUSCULAR | Qty: 1

## 2012-12-27 MED FILL — KETOROLAC TROMETHAMINE 30 MG/ML INJECTION: 30 mg/mL (1 mL) | INTRAMUSCULAR | Qty: 1

## 2012-12-27 MED FILL — SALINE FLUSH INJECTION SYRINGE: INTRAMUSCULAR | Qty: 10

## 2012-12-27 MED FILL — LEXAPRO 10 MG TABLET: 10 mg | ORAL | Qty: 1

## 2012-12-27 MED FILL — ONDANSETRON (PF) 4 MG/2 ML INJECTION: 4 mg/2 mL | INTRAMUSCULAR | Qty: 2

## 2012-12-27 MED FILL — XARELTO 15 MG TABLET: 15 mg | ORAL | Qty: 1

## 2012-12-27 MED FILL — OPTIRAY 350 MG IODINE/ML INTRAVENOUS SOLUTION: 350 mg iodine/mL | INTRAVENOUS | Qty: 100

## 2012-12-27 MED FILL — LOVENOX 40 MG/0.4 ML SUBCUTANEOUS SYRINGE: 40 mg/0.4 mL | SUBCUTANEOUS | Qty: 0.8

## 2012-12-27 MED FILL — SODIUM CHLORIDE 0.9 % IV: INTRAVENOUS | Qty: 1000

## 2012-12-27 MED FILL — LOVENOX 40 MG/0.4 ML SUBCUTANEOUS SYRINGE: 40 mg/0.4 mL | SUBCUTANEOUS | Qty: 0.4

## 2012-12-27 MED FILL — DIAZEPAM 5 MG TAB: 5 mg | ORAL | Qty: 1

## 2012-12-27 MED FILL — BUPROPION SR 100 MG TAB: 100 mg | ORAL | Qty: 3

## 2012-12-27 MED FILL — DIAZEPAM 5 MG TAB: 5 mg | ORAL | Qty: 2

## 2012-12-27 MED FILL — MAPAP (ACETAMINOPHEN) 325 MG TABLET: 325 mg | ORAL | Qty: 2

## 2012-12-27 NOTE — Progress Notes (Signed)
Readmission Risk Assessment:     Low Risk and [facility] patients    RRAT Score:  1 - 9 2    Initial Assessment:  Chart reviewed.  D/C plans discussed with staff and Dr Donnetta Simpers.  Pt lives with spouse in [place], [place], is independent with adls and uses no DME.  She has [employer] and would have a co pay with any meds.  The highest cost co pay would be her cost for xarelto and pradaxa.  I will give her a coupon for xarelto reducing her monthly co pay to only $10.  MD in agreement.  Pt anticipates d/c tomorrow to home without needs     Emergency Contact:   spouse    Pertinent Medical Hx:     PE, depression, left wrist fracture, left ankle fractures    PCP/Specialists: Dr Golda Acre        Community Services:   none    DME:     none    Low Risk Care Transition Plan:  1. Evaluate for [facility] or [facility], community care coordination of resources  2. Involve patient/caregiver in assessment, planning, education and implement of intervention.  3. CM daily patient care huddles/interdisciplinary rounds.  4. PCP/Specialist appointment within 7 - 10 days made prior to discharge.  5. Facilitate transportation and logistics for follow-up appointments.  6. Handoff to [employer] Nurse Navigator or PCP practice.  7.

## 2012-12-27 NOTE — Progress Notes (Signed)
Hospitalist Progress Note              Patient Name  Lindsay [name]     Admit date:  12/26/2012   Medical Record Number  [id number]   Age  57 y.o.   Date of Birth [birth year]   PCP Clarene Critchley, MD    Room Number  2214/01 [facility]     Admission Diagnoses:        <principal problem not specified>       IMPRESSION                            Hospital Day #1                               PLAN                                                                               DVT with PE  Started on Lovenox therapeutic dose   Will check background labs to evaluate for primary coagulopathy   Will start PO anticoagulant later today with Xarelto and stop Lovenox after one more dose   Check echo for right heart function    Hyperlipidemia  Start Statin    ASVD with calcification of LAD  Incidentally noted on CTA - primary risk reduction with controlled diet , exercise etc.    Acute anemia  Check FOBT   Add PPI for GI prophylaxis   STOP NSAID    Pleuritic chest pain  Sx Rx with Tylenol + Narc     Depression with anxiety  Continue Buspar and SSRI   Pt stated that she takes Valium 5 mg QAM - I will decrease the dose to 2.5 daily due to low SBP    GERD  Cont PPI PO            Social issues  Updates     Discharge Plan  Payor: BLUE CROSS  Plan: [facility] [place] [place] [facility] [place]  Product Type: PPO     Tomorrow if stable    Prophylaxis       PPI       CODE STATUS  Full    Prognosis  Good    Other medical conditions listed in this following active hospital problem list section; all of these and other pertinent data were taken into consideration when treatment plan is developed customized to this patient's unique circumstances and needs.     Patient Active Problem List    Diagnosis Date Noted   ??? Pulmonary embolism 12/26/2012     Priority: High   ??? Depression 04/01/2009     Priority: High   ??? Other and unspecified hyperlipidemia 12/27/2012   ??? Chest pain 08/10/2011   ??? Headache 06/05/2009  No Known Allergies     Subjective:   "I feel ok "    Review of Systems:   Respiratory: positive for pleurisy/chest pain  Cardiovascular: negative  Objective:     Physical Exam:    Visit Vitals   Item Reading   ??? BP 99/63   ??? Pulse 55   ??? Temp 98.1 ??F (36.7 ??C)   ??? Resp 18   ??? Ht 5\' 3"  (1.6 m)   ??? Wt 70.308 kg (155 lb)   ??? BMI 27.46 kg/m2   ??? SpO2 93%   ??? Breastfeeding No           Intake/Output Summary (Last 24 hours) at 12/27/12 0825  Last data filed at 12/26/12 2351   Gross per 24 hour   Intake      0 ml   Output    425 ml   Net   -425 ml     Wt Readings from Last 10 Encounters:   12/26/12 70.308 kg (155 lb)   11/30/12 72.576 kg (160 lb)   04/21/12 74.39 kg (164 lb)   12/21/11 68.04 kg (150 lb)   10/04/11 67.767 kg (149 lb 6.4 oz)   08/10/11 69.4 kg (153 lb)   07/16/11 70.308 kg (155 lb)   01/04/11 63.504 kg (140 lb)   11/25/10 58.968 kg (130 lb)   10/23/10 62.596 kg (138 lb)        Physical Exam:      Very pleasant lady in no distress   General:  Alert, cooperative,   well noursished,   well developed,   appears stated age    Ears/Eyes:  Hearing intact/ Sclera anicteric. Pupils equal   Mouth/Throat:  Mucous membranes normal pink and moist , oral pharynx clear    Neck:     Lungs:  Trachea midline  Chest excursion symmetrical   Auscultation B/L Symmetrical with vesicular breath sounds    Percussion note resonant on mid Clavicular line; no sign of pneumothorax     CV:  Regular rate and rhythm,  no  murmur,   No click, rub or gallop  S1 and S2 normal   Pedal pulses b/l symmetrical     Abdomen:    Soft, non-tender  Bowel sounds normal  No distension   Percussion note tympanitic   Extremities:  No cyanosis, jaundice  Left calf slightly  Edematous   No sign of acute trauma   Age appropriate OA changes noted.    Skin:  Skin color, texture, turgor normal. no acute rash or lesions    Lymph nodes:     Musculoskeletal Muscle bulk B/L symmetrical   Neuro Cranial  nerves are intact,   motor movement b/l symmetrical,   Sensory evaluation b/l symmetrical    Psych:  Alert and oriented, normal mood & affect given the clinical scenario        Data Review:   Recent Days:  Recent Labs      12/27/12   0400  12/26/12   1945   WBC  7.9  9.1   HGB  9.8*  11.2*   HCT  31.0*  34.5*   PLT  185  210     Recent Labs      12/27/12   0400  12/26/12   1945   NA  141  141   K  3.8  3.9   CL  105  106   CO2  29  28   GLU  97  89   BUN  13  15   CREA  0.71  0.50   CA  8.3*  8.9   ALB   --   3.7   TBILI   --   0.3   SGOT   --   17   ALT   --   18   INR   --   1.0     Lab Results   Component Value Date/Time    TSH 2.050 11/30/2012 10:41 AM     ______________________________________________________________________________________________________    Medications reviewed     Current Facility-Administered Medications   Medication Dose Route Frequency   ??? escitalopram oxalate (LEXAPRO) tablet 10 mg  10 mg Oral QPM   ??? atorvastatin (LIPITOR) tablet 10 mg  10 mg Oral QHS   ??? sodium chloride (NS) flush 5-10 mL  5-10 mL IntraVENous PRN   ??? [COMPLETED] morphine injection 2 mg  2 mg IntraVENous NOW   ??? [COMPLETED] ondansetron (ZOFRAN) injection 4 mg  4 mg IntraVENous NOW   ??? [COMPLETED] ioversol (OPTIRAY) 350 mg iodine/mL contrast solution 100 mL  100 mL IntraVENous RAD ONCE   ??? [COMPLETED] sodium chloride (NS) flush 10 mL  10 mL IntraVENous RAD ONCE   ??? [COMPLETED] 0.9% sodium chloride infusion  50 mL/hr IntraVENous RAD ONCE   ??? [COMPLETED] enoxaparin (LOVENOX) injection 70 mg  1 mg/kg SubCUTAneous NOW   ??? buPROPion SR (WELLBUTRIN SR) tablet 300 mg  300 mg Oral DAILY   ??? diazepam (VALIUM) tablet 10 mg  10 mg Oral QHS PRN   ??? enoxaparin (LOVENOX) 70 mg   SubCUTAneous Q12H   ??? sodium chloride (NS) flush 5-10 mL  5-10 mL IntraVENous Q8H   ??? sodium chloride (NS) flush 5-10 mL  5-10 mL IntraVENous PRN   ??? acetaminophen (TYLENOL) tablet 650 mg  650 mg Oral Q4H PRN   ??? oxyCODONE-acetaminophen (PERCOCET) 5-325 mg per  tablet 1 Tab  1 Tab Oral Q4H PRN   ??? acetaminophen (TYLENOL) tablet 650 mg  650 mg Oral Q4H PRN   ??? ondansetron (ZOFRAN) injection 4 mg  4 mg IntraVENous Q4H PRN   ??? morphine injection 2 mg  2 mg IntraVENous Q4H PRN     ______________________________________________________________________________________________________  Care Plan discussed with: Patient/Family, Nurse and Case Manager  ______________________________________________________________________________________________________    High complexity decision making was performed in this patient at high risk for decompensation with multiple organ involvement.  Today total floor/unit time was 35 minutes while caring for this patient and greater than 50% of that time was spent with patient and family discussing patient???s clinical condition, treatment plan and prognosis.  _________________________________________________________________________________________  Gabriel Carina MD MPH                                 12/27/2012

## 2012-12-27 NOTE — Progress Notes (Addendum)
Report received from [place], [place]. SBAR, Kardex and MAR was discussed.    Veda Canning, RN    1013: Pt. Off unit for testing.

## 2012-12-27 NOTE — Progress Notes (Signed)
Echo completed

## 2012-12-27 NOTE — Progress Notes (Signed)
Received report from Heather RN. Patient resting in bed. VSS. Will continue to monitor.

## 2012-12-28 LAB — CBC WITH AUTOMATED DIFF
ABS. BASOPHILS: 0 10*3/uL (ref 0.0–0.1)
ABS. EOSINOPHILS: 0.1 10*3/uL (ref 0.0–0.4)
ABS. LYMPHOCYTES: 1.1 10*3/uL (ref 0.8–3.5)
ABS. MONOCYTES: 0.8 10*3/uL (ref 0.0–1.0)
ABS. NEUTROPHILS: 5.2 10*3/uL (ref 1.8–8.0)
BASOPHILS: 0 % (ref 0–1)
EOSINOPHILS: 1 % (ref 0–7)
HCT: 31.5 % — ABNORMAL LOW (ref 35.0–47.0)
HGB: 9.9 g/dL — ABNORMAL LOW (ref 11.5–16.0)
LYMPHOCYTES: 15 % (ref 12–49)
MCH: 27 PG (ref 26.0–34.0)
MCHC: 31.4 g/dL (ref 30.0–36.5)
MCV: 85.8 FL (ref 80.0–99.0)
MONOCYTES: 11 % (ref 5–13)
NEUTROPHILS: 73 % (ref 32–75)
PLATELET: 174 10*3/uL (ref 150–400)
RBC: 3.67 M/uL — ABNORMAL LOW (ref 3.80–5.20)
RDW: 13.5 % (ref 11.5–14.5)
WBC: 7.2 10*3/uL (ref 3.6–11.0)

## 2012-12-28 LAB — METABOLIC PANEL, BASIC
Anion gap: 5 mmol/L (ref 5–15)
BUN/Creatinine ratio: 24 — ABNORMAL HIGH (ref 12–20)
BUN: 12 MG/DL (ref 6–20)
CO2: 29 mmol/L (ref 21–32)
Calcium: 8.6 MG/DL (ref 8.5–10.1)
Chloride: 104 mmol/L (ref 97–108)
Creatinine: 0.49 MG/DL (ref 0.45–1.15)
GFR est AA: >60 mL/min/{1.73_m2} (ref >60)
GFR est non-AA: >60 mL/min/{1.73_m2} (ref >60)
Glucose: 125 mg/dL — ABNORMAL HIGH (ref 65–100)
Potassium: 3.8 mmol/L (ref 3.5–5.1)
Sodium: 138 mmol/L (ref 136–145)

## 2012-12-28 LAB — PROTEIN S AG,FREE + TOTAL
Protein S, Free: 127 % — ABNORMAL HIGH (ref 56–124)
Protein S, Total: 129 % (ref 58–150)

## 2012-12-28 LAB — DILUTE RUSSELL VIPER VENOM TIME: dRVVT: 37.9 s (ref 0.0–55.1)

## 2012-12-28 LAB — PROTEIN C ACTIVITY: Protein C-Functional: 165 % — ABNORMAL HIGH (ref 74–151)

## 2012-12-28 MED ADMIN — diazepam (VALIUM) tablet 2.5 mg: ORAL | @ 14:00:00 | NDC 51079028501

## 2012-12-28 MED ADMIN — acetaminophen (TYLENOL) tablet 650 mg: ORAL | @ 02:00:00 | NDC 00904198261

## 2012-12-28 MED ADMIN — diazepam (VALIUM) tablet 10 mg: ORAL | @ 02:00:00 | NDC 51079028501

## 2012-12-28 MED ADMIN — sodium chloride (NS) flush 5-10 mL: INTRAVENOUS | @ 02:00:00 | NDC 87701099893

## 2012-12-28 MED ADMIN — buPROPion SR (WELLBUTRIN SR) tablet 300 mg: ORAL | @ 14:00:00 | NDC 68084047011

## 2012-12-28 MED ADMIN — atorvastatin (LIPITOR) tablet 10 mg: ORAL | @ 02:00:00 | NDC 68084009711

## 2012-12-28 MED ADMIN — sodium chloride (NS) flush 5-10 mL: INTRAVENOUS | @ 10:00:00 | NDC 87701099893

## 2012-12-28 MED ADMIN — rivaroxaban (XARELTO) tablet 15 mg: ORAL | @ 15:00:00 | NDC 50458057810

## 2012-12-28 MED ADMIN — acetaminophen (TYLENOL) tablet 650 mg: ORAL | @ 08:00:00 | NDC 00904198261

## 2012-12-28 MED ADMIN — sodium chloride (NS) flush 5-10 mL: INTRAVENOUS | @ 18:00:00 | NDC 87701099893

## 2012-12-28 MED ADMIN — morphine injection 2 mg: INTRAVENOUS | @ 02:00:00 | NDC 00409189001

## 2012-12-28 MED FILL — MAPAP (ACETAMINOPHEN) 325 MG TABLET: 325 mg | ORAL | Qty: 2

## 2012-12-28 MED FILL — LIPITOR 10 MG TABLET: 10 mg | ORAL | Qty: 1

## 2012-12-28 MED FILL — SALINE FLUSH INJECTION SYRINGE: INTRAMUSCULAR | Qty: 10

## 2012-12-28 MED FILL — DIAZEPAM 5 MG TAB: 5 mg | ORAL | Qty: 2

## 2012-12-28 MED FILL — DIAZEPAM 5 MG TAB: 5 mg | ORAL | Qty: 1

## 2012-12-28 MED FILL — MORPHINE 2 MG/ML INJECTION: 2 mg/mL | INTRAMUSCULAR | Qty: 1

## 2012-12-28 MED FILL — XARELTO 15 MG TABLET: 15 mg | ORAL | Qty: 1

## 2012-12-28 MED FILL — BUPROPION SR 100 MG TAB: 100 mg | ORAL | Qty: 3

## 2012-12-28 NOTE — Discharge Summary (Signed)
Physician Discharge Summary     Pt Name  Lindsay [name]   Admit date:  12/26/2012;12/28/2012   Discharge date and time:  12/28/2012;8:24 AM   Room Number  2214/01    Medical Record Number  [id number]   Age  57 y.o.   Date of Birth July 10, 1955   PCP Clarene Critchley, MD     Admission Diagnoses:            Pulmonary embolism   Hospital Problems Date Reviewed: 12/28/2012        ICD-9-CM Class Noted POA    *Pulmonary embolism 415.19  12/26/2012 Yes    Overview    Signed 12/27/2012  8:20 AM by Gabriel Carina, MD      1. Acute pulmonary thromboembolism in the right lower lobe pulmonary artery   with extension into proximal segmental branches.   2. Coronary artery calcification.            Depression 311  04/01/2009 Yes        Other and unspecified hyperlipidemia (Chronic) 272.4  12/27/2012 Yes                    No Known Allergies     Hospital Course      This pt was admitted with  Pulmonary embolism on 12/26/2012.  Pt was admitted to Acute care unit, started on Lovenox at therapeutic dose. She was quickly switched to PO Xarelto. She has tolerated the treatment well so far. We noted that the patient continues to have some pleuritic chest pain for which we are prescribing Percocet PRN. Echo done this admit to evaluate for signs of Cor pulmonale was negative except it showed mild pulmonary hypertension - as expected from subacute PE. Pt is also noted to have Exertional shortness of breath and we are going to evaluate her for her oxygen saturation and if necessary arrange for home oxygen.     We started her on Lipitor noting that she has elevated Total Cholesterol. I have had detailed discussion with the patient on this issue as well and she understands the rationale for primary prevention of ASVD.     I have also taken the liberty of decreasing the Valium dose to 2 mg BID PRN and issues a new Rx. It is my recommendation that she should take lower dose of benzos on a chronic patterm - I have asked her to seek help from her PCP on this  issue further.     At this time I see no indication for continued acute medical care hospitalization.     CDMP:     Condition at the time of discharge improved and stable .     Treatment team::  Treatment Team: Attending Provider: Gabriel Carina, MD; Consulting Provider: Pamalee Leyden, MD       Other Pertinent data:   TODAY's CLINICAL FINDINGS:   Visit Vitals   Item Reading   ??? BP 96/61   ??? Pulse 74   ??? Temp 99.5 ??F (37.5 ??C)   ??? Resp 18   ??? Ht 5\' 3"  (1.6 m)   ??? Wt 70.308 kg (155 lb)   ??? BMI 27.46 kg/m2   ??? SpO2 91%   ??? Breastfeeding No      Wt Readings from Last 10 Encounters:   12/26/12 70.308 kg (155 lb)   11/30/12 72.576 kg (160 lb)   04/21/12 74.39 kg (164 lb)   12/21/11 68.04 kg (150 lb)  10/04/11 67.767 kg (149 lb 6.4 oz)   08/10/11 69.4 kg (153 lb)   07/16/11 70.308 kg (155 lb)   01/04/11 63.504 kg (140 lb)   11/25/10 58.968 kg (130 lb)   10/23/10 62.596 kg (138 lb)          Exam: Pt is alert and oriented ; in no apparent distress       Current Discharge Medication List      START taking these medications    Details   atorvastatin (LIPITOR) 10 mg tablet Take 1 Tab by mouth nightly.  Qty: 30 Tab, Refills: 1      oxyCODONE-acetaminophen (PERCOCET) 5-325 mg per tablet Take 1 Tab by mouth every six (6) hours as needed for Pain.  Qty: 30 Tab, Refills: 0      !! rivaroxaban (XARELTO) 15 mg tab tablet Take 1 Tab by mouth two (2) times daily (with meals) for 14 days.  Qty: 28 Tab, Refills: 0      !! rivaroxaban (XARELTO) 20 mg tab tablet Take 1 Tab by mouth daily.  Qty: 30 Tab, Refills: 1       !! - Potential duplicate medications found. Please discuss with provider.      CONTINUE these medications which have CHANGED    Details   diazepam (VALIUM) 2 mg tablet Take 1 Tab by mouth two (2) times daily as needed for Anxiety.  Qty: 30 Tab, Refills: 0    Associated Diagnoses: Insomnia         CONTINUE these medications which have NOT CHANGED    Details   omeprazole (PRILOSEC) 20 mg capsule Take 20 mg by mouth daily.         buPROPion SR (WELLBUTRIN SR) 150 mg SR tablet Take 300 mg by mouth daily.      escitalopram (LEXAPRO) 10 mg tablet take 10 mg by mouth daily.      fluticasone (FLONASE) 50 mcg/actuation nasal spray 2 puff each qd  Qty: 1 Bottle, Refills: 5    Associated Diagnoses: Nasal congestion         STOP taking these medications       doxycycline (MONODOX) 100 mg capsule Comments:   Reason for Stopping:               Disposition:Home.   Diet: Cardiac Diet and Low fat, Low cholesterol   Follow up Care:    1. Clarene Critchley, MD in 1-2 weeks     Significant Diagnostic Studies:   Recent Labs      12/28/12   0400  12/27/12   0400   WBC  7.2  7.9   HGB  9.9*  9.8*   HCT  31.5*  31.0*   PLT  174  185     Recent Labs      12/28/12   0400  12/27/12   0400  12/26/12   1945   NA  138  141  141   K  3.8  3.8  3.9   CL  104  105  106   CO2  29  29  28    BUN  12  13  15    CREA  0.49  0.71  0.50   GLU  125*  97  89   CA  8.6  8.3*  8.9     Recent Labs      12/26/12   1945   SGOT  17   ALT  18   AP  151*   TBILI  0.3   TP  7.4   ALB  3.7   GLOB  3.7   LPSE  223     Recent Labs      12/26/12   1945   INR  1.0   PTP  10.2   APTT  26.8      No results found for this basename: FE, TIBC, PSAT, FERR,  in the last 72 hours   No results found for this basename: PH, PCO2, PO2,  in the last 72 hours  Recent Labs      12/26/12   1945   CPK  109     No components found with this basename: GLPOC,  GLUCPOC                 ________________________________________________________________________    Doylene Canard Time for face to face meeting with the pt and examination including care coordination with staff and chart review (>50% of total time listed here )  approx. 35 min]    _________________________________________________________________________  Gabriel Carina, MD MPH  12/28/2012

## 2012-12-28 NOTE — Progress Notes (Addendum)
07:30 Report reCeived from [employer]. SBAR, MAR, and KARDEX reviewed. Patient resting in the bed O2 saturations 86% on RA at rest. Patient encouraged to take slow deep breaths by tech, and saturations increased to 90%. Patient placed on [facility]. Notes having 4/10 pain with breathing, pt declines need for PRN pain medication. Will update MD upon rounding and continue to monitor.      08:24 Dr. Don Broach has seen the patient, and is updated on patient's condition. Instructed to check O2 sats at rest, and with ambulation on RA. Report results back to MD and await further instructions. Patient to be d/c.     13:45 At rest on RA 92% sat, with ambulation RA 89% sat. On 2L NC 93% at rest, on [facility] with ambulation 96%. Dr.Yousuf called and updated on patient condition Patient okay to be discharged. No order for O2. Instructed to educate patient on resting and conserving energy based on bodies needs. Patient to follow up with primary Dr. In 1 week. Will prepare patient for discharge, husband to provide transportation.

## 2012-12-29 NOTE — Progress Notes (Signed)
Patient on discharge report dated 12/28/12 from [facility]. Admission dates 12/26/12 - 12/28/12. Diagnosis: PE.  Left message on voicemail to return call & to make a f/u appt.  Will attempt to contact again.  Need to complete post-discharge assessment.

## 2012-12-31 LAB — CREATININE, POC
Creatinine (POC): 0.7 MG/DL (ref 0.6–1.3)
GFRAA, POC: >60 mL/min/{1.73_m2} (ref >60)
GFRNA, POC: >60 mL/min/{1.73_m2} (ref >60)

## 2012-12-31 MED ADMIN — 0.9% sodium chloride infusion: INTRAVENOUS | @ 20:00:00 | NDC 00409798348

## 2012-12-31 MED ADMIN — sodium chloride (NS) flush 10 mL: INTRAVENOUS | @ 20:00:00 | NDC 58016499501

## 2012-12-31 MED ADMIN — ioversol (OPTIRAY) 350 mg iodine/mL contrast solution 100 mL: INTRAVENOUS | @ 20:00:00 | NDC 00019133311

## 2012-12-31 MED FILL — SODIUM CHLORIDE 0.9 % IV: INTRAVENOUS | Qty: 1000

## 2012-12-31 MED FILL — SALINE FLUSH INJECTION SYRINGE: INTRAMUSCULAR | Qty: 10

## 2012-12-31 MED FILL — OPTIRAY 350 MG IODINE/ML INTRAVENOUS SOLUTION: 350 mg iodine/mL | INTRAVENOUS | Qty: 100

## 2012-12-31 NOTE — ED Notes (Signed)
Pt in CT scan

## 2012-12-31 NOTE — ED Provider Notes (Addendum)
HPI Comments: Lindsay [name] is a 57 y.o. WF who presents ambulatory to ED c/o right lower 3/10 chest pain x yesterday, along with associated SOB that is exacerbated by deep inspiration. Pt states she was admitted 5 days ago for PE of unknown origin, and her current sx's are similar to previous. Pt has taken tylenol and percocet for pain, and she is taking Xarelto. Pt states she is not on any hormone replacement therapy.   Pt specifically denies any F/C, N/V/D, HA.     PMHx significant for:   PE, headache, depression  PSHx significant for:     c section appendectomy, shoulder,ankle  Social Hx:  (-) tobacco, (-) EtOH    PCP:  Lindsay Critchley, MD    There are no other complaints, changes or physical findings at this time.   Written by Rolan Bucco. Manson Passey, ED scribe, as dictated by Sherron Monday, MD.     The history is provided by the patient. No language interpreter was used.        Past Medical History   Diagnosis Date   ??? Headache 2011   ??? Depression    ??? Depression    ??? Other and unspecified hyperlipidemia 12/27/2012        Past Surgical History   Procedure Laterality Date   ??? Hx other surgical  2010     left foot torn tendon   ??? Hx cesarean section     ??? Hx appendectomy     ??? Hx orthopaedic       rotater cuff r. shoulder   ??? Hx orthopaedic       left ankle fracture treated with air cast   ??? Hx orthopaedic  6/13     left shoulder fxt and dislocation         Family History   Problem Relation Age of Onset   ??? Heart Disease Mother    ??? Heart Disease Father         History     Social History   ??? Marital Status: MARRIED     Spouse Name: N/A     Number of Children: N/A   ??? Years of Education: N/A     Occupational History   ??? Not on file.     Social History Main Topics   ??? Smoking status: Never Smoker    ??? Smokeless tobacco: Never Used   ??? Alcohol Use: No   ??? Drug Use: No   ??? Sexually Active: Yes -- Female partner(s)     Other Topics Concern   ??? Not on file     Social History Narrative   ??? No narrative on file                   ALLERGIES: Review of patient's allergies indicates no known allergies.      Review of Systems   Constitutional: Negative for fever and chills.   HENT: Negative for congestion, rhinorrhea and sneezing.    Eyes: Negative.    Respiratory: Positive for shortness of breath.    Cardiovascular: Positive for chest pain. Negative for leg swelling.   Gastrointestinal: Negative for nausea, vomiting and abdominal pain.   Genitourinary: Negative.    Musculoskeletal: Negative for myalgias and back pain.   Skin: Negative for rash.   Neurological: Negative for syncope, light-headedness and headaches.   Hematological: Negative for adenopathy. Does not bruise/bleed easily.   Psychiatric/Behavioral: Negative.  Filed Vitals:    12/31/12 1523 12/31/12 1615   BP: 101/76 102/64   Pulse: 80    Temp: 98.7 ??F (37.1 ??C)    Resp: 18 15   Height: 5\' 3"  (1.6 m)    Weight: 70.3 kg (154 lb 15.7 oz)    SpO2: 98% 96%            Physical Exam   Nursing note and vitals reviewed.  Constitutional: She is oriented to person, place, and time. She appears well-developed and well-nourished. No distress.   HENT:   Head: Normocephalic and atraumatic.   Eyes: EOM are normal. Pupils are equal, round, and reactive to light.   Neck: Normal range of motion. Neck supple.   Cardiovascular: Normal rate, regular rhythm, normal heart sounds and intact distal pulses.    No murmur heard.  Pulmonary/Chest: Effort normal and breath sounds normal. No respiratory distress.   Abdominal: Soft. She exhibits no distension. There is no tenderness.   Musculoskeletal: Normal range of motion. She exhibits no edema.   Neurological: She is alert and oriented to person, place, and time. She has normal strength. No cranial nerve deficit.   No focal neuro deficit   Skin: Skin is warm and dry. No rash noted.   Psychiatric: She has a normal mood and affect.        MDM     Differential Diagnosis; Clinical Impression; Plan:     Will repeat PE CT to make sure no new clots on blood  thinners.  Amount and/or Complexity of Data Reviewed:   Clinical lab tests:  Reviewed and ordered  Tests in the radiology section of CPT??:  Reviewed and ordered  Tests in the medicine section of the CPT??:  Ordered and reviewed   Review and summarize past medical records:  Yes  Progress:   Patient progress:  Stable      Procedures    15:28 EKG interpretation: NSR, rate 77; PR 140, QRS 78, QTc 445; no acute ischemia; Charlie Pitter, MD      4:49 PM  I have just reevaluated the patient. I have reviewed Her vital signs and determined there is currently no worsening in their condition or physical exam.  Results have been reviewed with them and their questions have been answered.  Pt is made aware of plan - suspect that infiltrate is post PE sequelae after speaking with radiology based on location. She is not having any fevers or chills. Will d/c home with current medication regimen.     Charlie Pitter, MD        LABORATORY TESTS:  Recent Results (from the past 12 hour(s))   EKG, 12 LEAD, INITIAL    Collection Time     12/31/12  3:28 PM       Result Value Range    Ventricular Rate 77      Atrial Rate 77      P-R Interval 140      QRS Duration 78      Q-T Interval 394      QTC Calculation (Bezet) 445      Calculated P Axis 25      Calculated R Axis 48      Calculated T Axis 35      Diagnosis        Value: Normal sinus rhythm      Normal ECG      When compared with ECG of 26-Dec-2012 19:09,      No significant change  was found   POC CREATININE    Collection Time     12/31/12  3:58 PM       Result Value Range    Creatinine (POC) 0.7  0.6 - 1.3 MG/DL    GFR-AA (POC) >16  >10 ml/min/1.51m2    GFR, non-AA (POC) >60  >60 ml/min/1.71m2       IMAGING RESULTS:   CTA CHEST W WO CONT (Final result)  Result time: 12/31/12 16:24:55      Final result by Rad Results In Edi (12/31/12 16:24:55)      Narrative:    **Final Report**      ICD Codes / Adm.Diagnosis: 415.19 786.50 / Chest Pain possible blood clots  Examination: CT CHEST  ANGIOGRAPHY - [id number] - Dec 31 2012 4:08PM  Accession No: [id number]  Reason: Chest pain R/O PE      REPORT:  INDICATION: Chest pain    Comparison: December 26, 2012    TECHNIQUE:  Routine noncontrast imaging the chest was performed for localization   purposes. Then, following the uneventful intravenous administration of 80 cc   Optiray 350 , thin helical axial images were obtained through the chest. 3D   image postprocessing was performed.    FINDINGS:  There are filling defects within segmental and subsegmental pulmonary   arteries in the right lower lobe The main pulmonary artery is normal in   caliber. The thoracic aorta is normal in caliber and demonstrates no   evidence of dissection or aneurysm. The heart size is normal.    There is no hilar or mediastinal lymphadenopathy. The trachea and esophagus   are unremarkable. The thyroid gland demonstrates no abnormality. Bilateral   breast implants are present.    There is patchy airspace disease in the right lower lobe. There is bilateral   lower lobe atelectasis. No pulmonary nodule or mass is seen. There is a   small right pleural effusion.    Limited evaluation of the upper abdomen demonstrates no abnormality. The   adrenal glands are normal. Review of the bone windows demonstrates no   evidence of destructive bone lesion.      IMPRESSION:  Right lower lobe pulmonary emboli are unchanged since December 26, 2012.   Associated right lower lobe airspace disease may represent edema,   hemorrhage, or infarct. Small right pleural effusion.    The findings were called to Dr. Eulah Pont on December 31, 2012 at 4:23 PM by Dr.   Alessandra Bevels.    [phone]            Signing/Reading Doctor: Philomena Course [phone])   Approved: Philomena Course [phone]) Dec 31 2012 4:22PM            MEDICATIONS GIVEN:  Medications   0.9% sodium chloride infusion (not administered)   ioversol (OPTIRAY) 350 mg iodine/mL contrast solution 100 mL (not administered)   sodium chloride (NS) flush 10 mL (not administered)        IMPRESSION:  1. Dyspnea    2. Pulmonary emboli        PLAN:  1. F/U with PCP  Return to ED if worse     DISCHARGE NOTE  4:31 PM  The patient has been re-evaluated and is ready for discharge. Reviewed available results with patient. Counseled pt on diagnosis and care plan. Pt has expressed understanding, and all questions have been answered. Pt agrees with plan and agrees to F/U as recommended, or return to the ED if their sxs worsen. Discharge  instructions have been provided and explained to the pt, along with reasons to return to the ED.  Written by Rolan Bucco. Manson Passey, ED Scribe, as dictated by Sherron Monday, MD.

## 2012-12-31 NOTE — ED Notes (Signed)
Discharged from ED by MD in no acute distress, opportunity for questions provided. piv removed, catheter in tact. VSS. Nad. Ambulated out of ED.

## 2012-12-31 NOTE — ED Notes (Signed)
EKG obtained and given to MD

## 2013-01-01 LAB — EKG, 12 LEAD, INITIAL
Atrial Rate: 77 {beats}/min
Calculated P Axis: 25 degrees
Calculated R Axis: 48 degrees
Calculated T Axis: 35 degrees
Diagnosis: NORMAL
P-R Interval: 140 ms
Q-T Interval: 394 ms
QRS Duration: 78 ms
QTC Calculation (Bezet): 445 ms
Ventricular Rate: 77 {beats}/min

## 2013-01-02 NOTE — Progress Notes (Addendum)
Follow-up attempt made to contact pt regarding hospital discharge on 12/26/12 & ED visit 12/31/12, no answer. VM left to return call & to schedule a f/u appt if hadn't already done so. F/U appt noted for 01/04/13 with PCP Dr. Darl Pikes.    Patient on discharge report dated 12/31/12 from [facility] ED. Diagnosis: Dyspnea & C/P. Need to complete post-discharge assessment.  Letter mailed.

## 2013-01-04 NOTE — Progress Notes (Signed)
HISTORY OF PRESENT ILLNESS  Lindsay [name] is a 57 y.o. female.  HPI  This is first office visit with me following hospital stay for right lower pulmonary embolism.  She was at [facility] from June 24th through June 26th.  Interestingly, the etiology is not totally clear.  She did have a small hairline fracture in her left wrist at the end of May, but was never particularly immobilized with this.  She also went on a very long car ride driving to [place] in early June, at least a 10 hour drive.  She developed significant chest pain, right sided pain, and was found to have a right lower pulmonary embolism.  Duplex dopplers of legs were negative.  Protein C and protein S screening were normal.  She was started on Xarelto 15 mg b.i.d., which she is taking.  She went home on June 26th, but on the 29th she had intensifying pain in right chest, so went back to emergency room.  Repeat CTA showed again evidence of PE, but no new findings.  She notes she has not used the Percocet that she was given because she is fearful of it.  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  She has had some swelling in ankles.  This has been fairly stable.  She is not coughing.  She has had no fevers or chills.  She has some pain radiating from her chest up towards her neck.  Overall just feels exhausted.  She has had no evidence of bleeding.    MedDATA/mth      Review of Systems   Constitutional: Positive for malaise/fatigue. Negative for fever, chills and weight loss.   Respiratory: Negative for cough, shortness of breath and wheezing.    Cardiovascular: Positive for chest pain and leg swelling. Negative for palpitations, orthopnea and PND.   Gastrointestinal: Negative for heartburn and nausea.   Musculoskeletal: Positive for joint pain. Negative for myalgias and falls.   Neurological: Negative for dizziness and headaches.       Physical Exam   Nursing  note and vitals reviewed.  Constitutional: She is oriented to person, place, and time. She appears well-developed and well-nourished.   HENT:   Head: Normocephalic and atraumatic.   Neck: Normal range of motion. Neck supple. Carotid bruit is not present. No thyromegaly present.   Cardiovascular: Normal rate, regular rhythm, S1 normal, S2 normal, normal heart sounds and intact distal pulses.    No murmur heard.  Pulmonary/Chest: Effort normal and breath sounds normal. No respiratory distress. She has no wheezes. She has no rales.   Musculoskeletal: She exhibits edema (trace bilat).   Neurological: She is alert and oriented to person, place, and time.   Psychiatric: She has a normal mood and affect. Her behavior is normal.       ASSESSMENT and PLAN  [place] was seen today for hospital follow up.    Diagnoses and associated orders for this visit:    Pulmonary embolism-etiology unclear-certainly long car ride and ortho injury could have set her up for this-will send to heme onc  Reviewed may cont to have pain in right chest and ok to use the percocet  Cont xarelto and change to qd dosing as planned end of july  - REFERRAL TO ONCOLOGY    Anxiety  Cont valium, lexapro,wellbutrin  Chest pain-as above    appt here in 58mo sooner  prn  Needs fu on incidental coronary atherosclerosis seen on cta-will do at next appt

## 2013-01-15 NOTE — Telephone Encounter (Signed)
L/Vm for return call

## 2013-01-15 NOTE — Telephone Encounter (Signed)
Message copied by Hazeline Junker on Mon Jan 15, 2013  2:51 PM  ------       Message from: GAMBLE, Geoffry Paradise       Created: Mon Jan 15, 2013  2:46 PM       Regarding: Darl Pikes / Telephone         [phone]              Pt requests return call from nurse (reason undisclosed).   ------

## 2013-01-16 NOTE — Telephone Encounter (Signed)
L/Vm for return call

## 2013-01-16 NOTE — Telephone Encounter (Signed)
Pt called to say she was sorry about a previous message, she is having bad day, did not realize you had called her.

## 2013-01-16 NOTE — Telephone Encounter (Signed)
Pt calling she is not due for follow up with Dr. Darl Pikes for PE for two months but concerned as she feels so bad, she knows she if over doing it but is having difficulty with SOB, Pain across mid-back, feels like their is a gallon water bag on her chest and severe sharp pain in sternum. Advised to come in for follow. Appt provided

## 2013-01-16 NOTE — Telephone Encounter (Signed)
Patient is returning your call her no is [phone]

## 2013-01-17 NOTE — Progress Notes (Signed)
HISTORY OF PRESENT ILLNESS  Lindsay [name] is a 57 y.o. female.  HPI  Lindsay [name] is now almost three weeks out from her pulmonary embolism.  She has had a lot of stress.  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  Her daughter and her friends, as well as son and his friends are all staying at her house.  She feels responsible for caring for everyone.  She has been having sharp pains in her chest and has felt short of breath at times.  She feels anxious.  She has not missed any of her Xarelto and is about to switch from b.i.d. 15 to 20 mg daily tomorrow.  She has not had fevers, chills or cough.  The pain is still in the area where her known PE is located.    MedDATA/mth      Review of Systems   Constitutional: Negative for fever and chills.   Respiratory: Positive for shortness of breath. Negative for cough.    Cardiovascular: Positive for chest pain. Negative for leg swelling.   Gastrointestinal: Negative for abdominal pain.   Endo/Heme/Allergies: Does not bruise/bleed easily.   Psychiatric/Behavioral: The patient is nervous/anxious.        Physical Exam   Nursing note and vitals reviewed.  Constitutional: She is oriented to person, place, and time. She appears well-developed and well-nourished.   HENT:   Head: Normocephalic and atraumatic.   Neck: Normal range of motion. Neck supple. Carotid bruit is not present. No thyromegaly present.   Cardiovascular: Normal rate, regular rhythm, S1 normal, S2 normal, normal heart sounds and intact distal pulses.    No murmur heard.  Pulmonary/Chest: Effort normal and breath sounds normal. No respiratory distress. She has no wheezes. She has no rales.   Musculoskeletal: She exhibits no edema.   Neurological: She is alert and oriented to person, place, and time.   Psychiatric: She has a normal mood and affect. Her behavior is normal.       ASSESSMENT and PLAN  Pernella was seen today for follow-up.    Diagnoses and associated orders for this  visit:    Pulmonary embolism  - AMB POC EKG ROUTINE W/ 12 LEADS, INTER & REP-no acute findings    Chest pain-suspect all from pe but check stress echo to be sure no cardiac component  - ECHO TTE STRESS EXERCISE TREADMILL COMP; Future  - ECHO TTE STRESS EXRCSE COMP W OR WO CONTR; Future    Depression    Other Orders-inc from 10 to 20 mg dose lexapro  appt in 37mo  - escitalopram oxalate (LEXAPRO) 20 mg tablet; Take 1 Tab by mouth daily.

## 2013-01-17 NOTE — Progress Notes (Signed)
"  REVIEWED RECORD IN PREPARATION FOR VISIT AND HAVE OBTAINED NECESSARY DOCUMENTATION"

## 2013-01-18 NOTE — Progress Notes (Signed)
Pt noted in for f/u 01/17/13 with PCP Dr. Darl Pikes. Will f/u at a later time.

## 2013-01-26 LAB — D-DIMER, QUANTITATIVE: D-Dimer, Quant: 0.17 mg/L FEU (ref 0.00–0.65)

## 2013-01-26 LAB — D DIMER: D-dimer: 0.17 mg/L FEU (ref 0.00–0.65)

## 2013-01-26 NOTE — Progress Notes (Signed)
[facility] Lab Visit:    [id number]  Pt arrived ambulatory and in no distress, labs drawn per K. Barrett, Lab tech.  Departed [facility] ambulatory and in no distress.    BP 99/63   Pulse 63   Temp(Src) 98.4 ??F (36.9 ??C)   Resp 14    Recent Results (from the past 12 hour(s))   D DIMER    Collection Time     01/26/13 12:05 PM       Result Value Range    D-dimer 0.17  0.00 - 0.65 mg/L FEU     See [facility] for remainder of pending labs.

## 2013-01-26 NOTE — Progress Notes (Signed)
[facility]  Medical Oncology at [place]. Thelma Barge  [phone]    Hematology / Oncology Consult    Reason for Visit:   Lindsay [name] is a 57 y.o. female who is seen in consultation at the request of Dr. Darl Pikes for evaluation of pulmonary embolism.      History of Present Illness:   Lindsay [name] is a pleasant 57yo female who presents today for evaluation.  History is obtained from the patient and from review of records.  She reports that on 11/27/2012 she fell and broke her wrist.  She had a splint applied to her arm, with immbolization for 3-4 weeks.  Within a week or two of the splint begin removed, she developed right lower chest pain, along with progressive dyspnea.  She went to [facility] for evaluation, and was found to have hypoxia, referred to [facility] for evaluation.  There she was diagnosed with a PE.  She was started on lovenox, and then switched to rivaroxaban.  She has tolerated anticoagulation well, without bleeding.    Her symptoms have largely improved.  She still gets some dyspnea, mostly when out in humid environments, or with significant exertion.  She continues with some pain in her right lower chest as well.  It is not particularly pleuritic in nature, but a constant dull ache.  She also gets some intermittent sharp stabbing pains in the center of her chest, lasting for 15 minutes at most, generally occuring when at rest.  Her PCP has her scheduled for a stress test to further evaluate.    She denies any swelling or discomfort in her legs or arms.    She does report significant anxiety regarding her VTE diagnosis.  It is causing her a great deal of stress and worry.    Past Medical History   Diagnosis Date   ??? Headache 2011   ??? Depression    ??? Depression    ??? Other and unspecified hyperlipidemia 12/27/2012      Past Surgical History   Procedure Laterality Date   ??? Hx other surgical  2010     left foot torn tendon   ??? Hx cesarean section     ??? Hx appendectomy     ??? Hx  orthopaedic       rotater cuff r. shoulder   ??? Hx orthopaedic       left ankle fracture treated with air cast   ??? Hx orthopaedic  6/13     left shoulder fxt and dislocation      History   Substance Use Topics   ??? Smoking status: Never Smoker    ??? Smokeless tobacco: Never Used   ??? Alcohol Use: No      Family History   Problem Relation Age of Onset   ??? Heart Disease Mother    ??? Heart Disease Father      Current Outpatient Prescriptions   Medication Sig   ??? escitalopram oxalate (LEXAPRO) 20 mg tablet Take 1 Tab by mouth daily.   ??? rivaroxaban (XARELTO) 20 mg tab tablet Take 1 Tab by mouth daily.   ??? omeprazole (PRILOSEC) 20 mg capsule Take 20 mg by mouth daily.     ??? buPROPion SR (WELLBUTRIN SR) 150 mg SR tablet Take 300 mg by mouth daily.   ??? diazepam (VALIUM) 2 mg tablet Take 1 Tab by mouth two (2) times daily as needed for Anxiety.   ??? atorvastatin (LIPITOR) 10 mg tablet Take 1 Tab  by mouth nightly.   ??? oxyCODONE-acetaminophen (PERCOCET) 5-325 mg per tablet Take 1 Tab by mouth every six (6) hours as needed for Pain.   ??? fluticasone (FLONASE) 50 mcg/actuation nasal spray 2 puff each qd     No current facility-administered medications for this visit.      No Known Allergies     Review of Systems: 10 systems reviewed and negative except for that written in the History of Present Illness.    Physical Exam:   BP 119/67   Pulse 68   Temp(Src) 97.4 ??F (36.3 ??C) (Oral)   Resp 12   Ht 5\' 3"  (1.6 m)   Wt 156 lb 6.4 oz (70.943 kg)   BMI 27.71 kg/m2   SpO2 97%  General: No distress  Eyes: PERRLA, anicteric sclerae  HENT: Atraumatic, OP clear  Neck: Supple  Lymphatic: No cervical, supraclavicular, or inguinal adenopathy  Respiratory: CTAB, normal respiratory effort  CV: Normal rate, regular rhythm, no murmurs, no peripheral edema  GI: Soft, nontender, nondistended, no masses, no hepatomegaly, no splenomegaly  MS: Normal gait and station. Digits without clubbing or cyanosis.  Skin: No rashes, ecchymoses, or petechiae. Normal  temperature, turgor, and texture.  Psych: Alert, oriented, anxious. appropriate affect, normal judgment/insight    Results:     Lab Results   Component Value Date/Time    WBC 7.2 12/28/2012  4:00 AM    HGB 9.9 12/28/2012  4:00 AM    HCT 31.5 12/28/2012  4:00 AM    PLATELET 174 12/28/2012  4:00 AM    MCV 85.8 12/28/2012  4:00 AM    ABS. NEUTROPHILS 5.2 12/28/2012  4:00 AM     Lab Results   Component Value Date/Time    Sodium 138 12/28/2012  4:00 AM    Potassium 3.8 12/28/2012  4:00 AM    Chloride 104 12/28/2012  4:00 AM    CO2 29 12/28/2012  4:00 AM    Glucose 125 12/28/2012  4:00 AM    BUN 12 12/28/2012  4:00 AM    Creatinine 0.49 12/28/2012  4:00 AM    GFR est AA >60 12/28/2012  4:00 AM    GFR est non-AA >60 12/28/2012  4:00 AM    Calcium 8.6 12/28/2012  4:00 AM    Creatinine (POC) 0.7 12/31/2012  3:58 PM     Lab Results   Component Value Date/Time    Bilirubin, total 0.3 12/26/2012  7:45 PM    ALT 18 12/26/2012  7:45 PM    AST 17 12/26/2012  7:45 PM    Alk. phosphatase 151 12/26/2012  7:45 PM    Protein, total 7.4 12/26/2012  7:45 PM    Albumin 3.7 12/26/2012  7:45 PM    Globulin 3.7 12/26/2012  7:45 PM       Labs 12/27/2012:  Protein C functional activity: 165%  Protein S total antigen: 129%  Protein S free antigen: 127%    CTA Chest 12/26/2012: acute PE in RLL pulmonary artery, with extension into proximal segmental branches.    CTA Chest 10/31/2008: negative for PE    Bilateral LE dopplers 12/27/2012: negative    Assessment:   1) Pulmonary Embolism  Diagnosed 12/26/2012. Risk factors at that time include a recent fall, fractured wrist with upper extremity immobilization, and two 10+ hour car rides in the month prior.  She is currently on anticoagulation with rivaroxaban.    I am inclined to describe her event as a provoked VTE, given her risk  factors.  However, these are all somewhat minor risk factors.  Given the uncertainty, as well as her significant anxiety, I will complete her thrombophilia panel to ensure there is nothing present  that may argue for long-term anticoagulation.  If testing is negative, then my recommendation is to treat this as a provoked event, with 3 months of anticoagulation ACCP guidelines.    2) Right lower rib pain  Likely pleurisy related to her recent PE.  Discussed OTC measures to manage pain.      Plan:     ?? Thrombophilia labs today  ?? Continue rivaroxaban 20mg  PO daily  ?? Return to see me in 2-3 months, and at that time we can discuss whether to discontinue her anticoagulation    I appreciate the opportunity to participate in Lindsay [name]'s care.    Signed By: Marthann Schiller, MD     January 26, 2013

## 2013-01-26 NOTE — Progress Notes (Signed)
Lindsay [name] is a 57 y.o. female new patient being seen for PE.

## 2013-01-28 LAB — BETA-2 GLYCOPROTEIN I ABS
Beta-2 Glycoprotein I Ab, IgA: <9 GPI IgA units (ref 0–25)
Beta-2 Glycoprotein I Ab, IgG: <9 GPI IgG units (ref 0–20)
Beta-2 Glycoprotein I Ab, IgM: <9 GPI IgM units (ref 0–32)

## 2013-01-29 NOTE — Telephone Encounter (Signed)
L/Vm for return call

## 2013-01-29 NOTE — Telephone Encounter (Signed)
Pt called regarding her appointment with the blood specialist,Dr Alysia Penna was not available so she saw his associate and he is very reluctant to do any blood work and wants her to stop taking the blood thinners within 3 weeks. Susanne feels that this is not what she wants to do. Aldena would like to know what Dr Darl Pikes would like to do because the patient does not feel comfortable with just stopping the blood thinners   Pt contact info [phone]

## 2013-01-29 NOTE — Telephone Encounter (Signed)
Offer her a second opinion with dr Lorie Apley hematologist or appt with me if she prefers thanks

## 2013-01-30 LAB — CARDIOLIPIN AB PANEL
Cardiolipin Ab, IgA: <9 APL U/mL (ref 0–11)
Cardiolipin Ab, IgG: <9 GPL U/mL (ref 0–14)
Cardiolipin Ab, IgM: <9 MPL U/mL (ref 0–12)

## 2013-01-30 LAB — ECHO TTE STRESS EXERCISE TREADMILL COMP
Diagnosis: NORMAL
Duke treadmill score: 5456
ECG Interp. Before Exercise: NORMAL
Functional capacity: NORMAL
Max. Diastolic BP: 80 mmHg
Max. Heart rate: 162 {beats}/min
Max. Systolic BP: 162 mmHg
Overall BP response to exercise: NORMAL
Peak Ex METs: 10.6 METS

## 2013-01-30 NOTE — Progress Notes (Signed)
Quick Note:    Notify normal stress ec ho  ______

## 2013-01-30 NOTE — Telephone Encounter (Signed)
FYI:     Spoke to patient regarding concern with blood thinner. States she was instructed by specialist to stop her blood thinner August 7th, 2014. She is uncomfortable with this and also has concerns about a trip she really wants to take on the 10th of August. Scheduled her to see Dr. Darl Pikes August 4th, 2014. Advised of Dr. Clovis Riley recommendation, she feels she wants to discuss with Dr. Darl Pikes.

## 2013-01-30 NOTE — Telephone Encounter (Signed)
L/Vm for return call

## 2013-01-30 NOTE — Telephone Encounter (Signed)
Spoke to patient about normal stress echo.

## 2013-01-30 NOTE — Progress Notes (Signed)
Resolving episode from 12/28/12. No further hospital admissions or emergency room visits within the past 30 days. Last OV was 01/17/13.

## 2013-01-30 NOTE — Telephone Encounter (Signed)
Pt returning call Pt contact info [phone]

## 2013-01-30 NOTE — Telephone Encounter (Signed)
Message copied by Linford Arnold on Tue Jan 30, 2013  1:41 PM  ------       Message from: Larita Fife       Created: Tue Jan 30, 2013  1:04 PM                       ----- Message -----          From: Clarene Critchley, MD          Sent: 01/30/2013  12:17 PM            To: Leslee Home, LPN              Notify normal stress ec ho  ------

## 2013-01-31 LAB — FACTOR II  DNA ANALYSIS

## 2013-01-31 LAB — FACTOR V LEIDEN

## 2013-01-31 LAB — PTT-LA MIX REFLEX: PTT-LA mix: 56.8 s — ABNORMAL HIGH (ref 0.0–50.0)

## 2013-01-31 LAB — HEXAGONAL PHASE PHOSPHOLIPID REFLEX: Hex phase phospholipid: 9.7 s — ABNORMAL HIGH (ref 0.0–8.0)

## 2013-01-31 LAB — LUPUS ANTICOAGULANT PANEL W/ REFLEX
PTT-LA: 59.5 s — ABNORMAL HIGH (ref 0.0–50.0)
dRVVT: 88 s — ABNORMAL HIGH (ref 0.0–55.1)

## 2013-01-31 LAB — ANTITHROMBIN III ACTIVITY: Antithrombin Activity: 130 % (ref 75–135)

## 2013-01-31 LAB — DRVVT MIX: dRVVT Mix: 63 s — ABNORMAL HIGH (ref 0.0–45.4)

## 2013-01-31 LAB — DRVVT CONFIRM: dRVVT ratio: 1.5 ratio — ABNORMAL HIGH (ref 0.0–1.4)

## 2013-02-02 NOTE — Progress Notes (Signed)
Quick Note:    Notified by letter.  Appt needed  ______

## 2013-02-05 NOTE — Progress Notes (Signed)
"  REVIEWED RECORD IN PREPARATION FOR VISIT AND HAVE OBTAINED NECESSARY DOCUMENTATION"

## 2013-02-05 NOTE — Progress Notes (Signed)
HISTORY OF PRESENT ILLNESS  Lindsay [name] is a 57 y.o. female.  HPI  Lindsay [name] comes in to discuss my opinion on treatment of her pulmonary embolism.  She has been on anticoagulant since the end of June and Dr. Skipper Cliche suggested a three month course, I think this is reasonable, finish the end of September.  Discussed risks of upcoming travel.  She has a 10 hour car ride, but is thinking about breaking it up into a short plane ride, followed by a much shorter car ride.  I think this is reasonable.  We discussed symptoms, which are overall improving.  Less chest pain, still some fatigue.    MedDATA/mth      ROS    Physical Exam   Nursing note and vitals reviewed.  Constitutional: She is oriented to person, place, and time. She appears well-developed and well-nourished.   HENT:   Head: Normocephalic and atraumatic.   Neck: Normal range of motion. Neck supple. Carotid bruit is not present. No thyromegaly present.   Cardiovascular: Normal rate, regular rhythm, S1 normal, S2 normal, normal heart sounds and intact distal pulses.    No murmur heard.  Pulmonary/Chest: Effort normal and breath sounds normal. No respiratory distress. She has no wheezes. She has no rales.   Musculoskeletal: She exhibits no edema.   Neurological: She is alert and oriented to person, place, and time.   Psychiatric: She has a normal mood and affect. Her behavior is normal.       ASSESSMENT and PLAN  Lindsay [name] was seen today for follow-up.    Diagnoses and associated orders for this visit:    Pulmonary embolism  Over 50% of the 15 minutes face to face with [name] consisted of counseling and/or discussing treatment plans in reference to her use of xarelto and risk factors for clot and plan for 3 mo course ends end of sept.              Other Orders  - rivaroxaban (XARELTO) 20 mg tab tablet; Take 20 mg by mouth two (2) times daily (with meals).

## 2013-02-16 NOTE — Telephone Encounter (Signed)
Pt advised to return to correct dose Xarelto 20 mg daily. Watch for any excessive/unexplained bruising and/or bleeding if this happens seek treatment. Pt understood and agrees to plan.

## 2013-02-16 NOTE — Telephone Encounter (Signed)
Patient states she is returning nurse call her no is [phone]

## 2013-02-16 NOTE — Telephone Encounter (Signed)
Patient called and stated that she was prescribed Xarelto 20mg  at [facility] since July 25.Lindsay [name] She has been taking the medication twice a day for 3 weeks. She discovered that she had been taking too much when she went to the pharmacy to get a refill and they told her that she taking too much. The patient would like to speak with dr. Darl Pikes and wanted to know what she should do. The contact number is [phone].

## 2013-02-16 NOTE — Telephone Encounter (Signed)
Ordered 20 mg qd on 7/25-she should drop back to daily and watch for any signs of bleeding or excess bruising but reassure her this is unlikely to happen and she should be fine

## 2013-02-16 NOTE — Telephone Encounter (Signed)
L/vm for return call

## 2013-02-26 NOTE — Addendum Note (Signed)
Addended by: Mahala Menghini on: 02/26/2013 02:18 PM     Modules accepted: Level of Service

## 2013-03-29 NOTE — Progress Notes (Signed)
Lindsay [name] is a 57 y.o. female follow up for pulmonary embolism.

## 2013-03-29 NOTE — Progress Notes (Signed)
[facility]  Medical Oncology at [place]. Thelma Barge  [phone]    Hematology / Oncology Followup    Reason for Visit:   Lindsay [name] is a 57 y.o. female who is seen for follow up of pulmonary embolism.      History of Present Illness:   She returns today for scheduled follow up.  She is now 3 months on rivaroxaban.  Her script ran out yesterday, and she has not refilled it.  She has tolerated it well, no bleeding.  Breathing much better, though occasionally some DOE when climbing stairs.  No chest pain.  No extremity swelling.    PAST HISTORY: The following sections were reviewed and updated in the EMR as appropriate: PMH, SH, FH, Medications, Allergies.    She is unaccompanied today.    No Known Allergies     Review of Systems: A 10-point review of systems was obtained, reviewed, and scanned into the EMR.  Negative except as reviewed in the HPI.      Physical Exam:   BP 125/79   Pulse 73   Temp(Src) 97.2 ??F (36.2 ??C) (Oral)   Resp 14   Ht 5\' 3"  (1.6 m)   Wt 161 lb 1.6 oz (73.074 kg)   BMI 28.54 kg/m2   SpO2 95%  General: No distress  Eyes: PERRLA, anicteric sclerae  HENT: Atraumatic, OP clear  Neck: Supple  Lymphatic: No cervical, supraclavicular, or inguinal adenopathy  Respiratory: CTAB, normal respiratory effort  CV: Normal rate, regular rhythm, no murmurs, no peripheral edema  GI: Soft, nontender, nondistended, no masses, no hepatomegaly, no splenomegaly  MS: Normal gait and station. Digits without clubbing or cyanosis.  Skin: No rashes, ecchymoses, or petechiae. Normal temperature, turgor, and texture.  Psych: Alert, oriented, anxious. appropriate affect, normal judgment/insight    Results:     Lab Results   Component Value Date/Time    WBC 7.2 12/28/2012  4:00 AM    HGB 9.9 12/28/2012  4:00 AM    HCT 31.5 12/28/2012  4:00 AM    PLATELET 174 12/28/2012  4:00 AM    MCV 85.8 12/28/2012  4:00 AM    ABS. NEUTROPHILS 5.2 12/28/2012  4:00 AM     Lab Results   Component Value Date/Time    Sodium 138  12/28/2012  4:00 AM    Potassium 3.8 12/28/2012  4:00 AM    Chloride 104 12/28/2012  4:00 AM    CO2 29 12/28/2012  4:00 AM    Glucose 125 12/28/2012  4:00 AM    BUN 12 12/28/2012  4:00 AM    Creatinine 0.49 12/28/2012  4:00 AM    GFR est AA >60 12/28/2012  4:00 AM    GFR est non-AA >60 12/28/2012  4:00 AM    Calcium 8.6 12/28/2012  4:00 AM    Creatinine (POC) 0.7 12/31/2012  3:58 PM     Lab Results   Component Value Date/Time    Bilirubin, total 0.3 12/26/2012  7:45 PM    ALT 18 12/26/2012  7:45 PM    AST 17 12/26/2012  7:45 PM    Alk. phosphatase 151 12/26/2012  7:45 PM    Protein, total 7.4 12/26/2012  7:45 PM    Albumin 3.7 12/26/2012  7:45 PM    Globulin 3.7 12/26/2012  7:45 PM       Labs 01/26/2013:  D-dimer: 0.17  Factor V Leiden: negative  AT: 130%  Prothrombin gene mutation: negative  Lupus anticoagulant: positive  Cardiolipin abs: negative  Beta-2 glycoprotein abs: negative    Labs 12/27/2012:  Protein C functional activity: 165%  Protein S total antigen: 129%  Protein S free antigen: 127%    CTA Chest 12/26/2012: acute PE in RLL pulmonary artery, with extension into proximal segmental branches.    CTA Chest 10/31/2008: negative for PE    Bilateral LE dopplers 12/27/2012: negative    Assessment:   1) Pulmonary Embolism, provoked  Diagnosed 12/26/2012. Risk factors at that time include a recent fall, fractured wrist with upper extremity immobilization, and two 10+ hour car rides in the month prior.  She is currently on anticoagulation with rivaroxaban, though she ran out yesterday.    I am inclined to describe her event as a provoked VTE, given her risk factors.  However, I did complete her thrombophilia panel, and it uncovered lupus anticoagulant.  Rivaroxaban can lead to a false-positive DRVVT, so this may be nothing.  I will have her stop her rivaroxaban, now that she has completed a 3 month course, and we will repeat APLA panel in one month (which will be 3 months from when it was last checked).  If positive, we may need to  resume anticoagulation long-term.      Plan:     ?? Stop rivaroxaban  ?? Repeat APLA panel in one month  ?? Return to see me after these results are back      Signed By: Marthann Schiller, MD     March 29, 2013

## 2013-04-25 ENCOUNTER — Encounter

## 2013-05-03 LAB — D-DIMER, QUANTITATIVE: D-Dimer, Quant: 0.34 mg/L FEU (ref 0.00–0.49)

## 2013-05-03 LAB — D DIMER: D-dimer: 0.34 mg/L FEU (ref 0.00–0.49)

## 2013-05-09 LAB — ANTIPHOSPHOLIPID SYNDROME PANEL
Anticardiolipin Ab, IgA: <10 [APL'U]
Anticardiolipin Ab, IgG: <10 [GPL'U]
Anticardiolipin Ab, IgM: <10 [MPL'U]
Antiphosphatidylserine IgG: 5 {GPS'U}
Antiphosphatidylserine IgM: 1 {MPS'U}
Beta-2 Glycoprotein I, IgA: <10 SAU
Beta-2 Glycoprotein I, IgG: <10 SGU
Beta-2 Glycoprotein I, IgM: <10 SMU
DRVVT Screen: 34.3 s
Hexagonal Phospholipid Neutral: 5 s
Platelet Neutralization: 0 s
Prothrombin Ab, IgG: 5 G units
Prothrombin Ab, IgM: 13 M units
aPTT: 28.3 s

## 2013-05-10 NOTE — Progress Notes (Signed)
[facility]  Medical Oncology at [place]. Thelma Barge  [phone]    Hematology / Oncology Followup    Reason for Visit:   Lindsay [name] is a 57 y.o. female who is seen for follow up of pulmonary embolism.      History of Present Illness:   She returns today for scheduled follow up.  She has been off her rivaroxaban now for over a month.  She is feeling well.  No significant increase in her leg swelling.  She has one episode of DOE the other day while doing laundry, but it has resolved.  No chest pain.    PAST HISTORY: The following sections were reviewed and updated in the EMR as appropriate: PMH, SH, FH, Medications, Allergies.    She is unaccompanied today.    No Known Allergies     Review of Systems: A 10-point review of systems was obtained, reviewed, and scanned into the EMR.  Negative except as reviewed in the HPI.      Physical Exam:   BP 129/73   Pulse 76   Temp(Src) 98.2 ??F (36.8 ??C) (Oral)   Resp 20   Ht 5\' 3"  (1.6 m)   Wt 164 lb 6.4 oz (74.571 kg)   BMI 29.13 kg/m2   SpO2 98%  General: No distress  Eyes: PERRLA, anicteric sclerae  HENT: Atraumatic, OP clear  Neck: Supple  Lymphatic: No cervical, supraclavicular, or inguinal adenopathy  Respiratory: CTAB, normal respiratory effort  CV: Normal rate, regular rhythm, no murmurs, no peripheral edema  GI: Soft, nontender, nondistended, no masses, no hepatomegaly, no splenomegaly  MS: Normal gait and station. Digits without clubbing or cyanosis.  Skin: No rashes, ecchymoses, or petechiae. Normal temperature, turgor, and texture.  Psych: Alert, oriented, anxious. appropriate affect, normal judgment/insight    Results:     Lab Results   Component Value Date/Time    WBC 7.2 12/28/2012  4:00 AM    HGB 9.9 12/28/2012  4:00 AM    HCT 31.5 12/28/2012  4:00 AM    PLATELET 174 12/28/2012  4:00 AM    MCV 85.8 12/28/2012  4:00 AM    ABS. NEUTROPHILS 5.2 12/28/2012  4:00 AM     Lab Results   Component Value Date/Time    Sodium 138 12/28/2012  4:00 AM    Potassium 3.8  12/28/2012  4:00 AM    Chloride 104 12/28/2012  4:00 AM    CO2 29 12/28/2012  4:00 AM    Glucose 125 12/28/2012  4:00 AM    BUN 12 12/28/2012  4:00 AM    Creatinine 0.49 12/28/2012  4:00 AM    GFR est AA >60 12/28/2012  4:00 AM    GFR est non-AA >60 12/28/2012  4:00 AM    Calcium 8.6 12/28/2012  4:00 AM    Creatinine (POC) 0.7 12/31/2012  3:58 PM     Lab Results   Component Value Date/Time    Bilirubin, total 0.3 12/26/2012  7:45 PM    ALT 18 12/26/2012  7:45 PM    AST 17 12/26/2012  7:45 PM    Alk. phosphatase 151 12/26/2012  7:45 PM    Protein, total 7.4 12/26/2012  7:45 PM    Albumin 3.7 12/26/2012  7:45 PM    Globulin 3.7 12/26/2012  7:45 PM       05/02/2013:  D-dimer: 0.34  Lupus anticoagulant: negative    01/26/2013:  D-dimer: 0.17  Factor V Leiden: negative  AT: 130%  Prothrombin gene mutation: negative  Lupus anticoagulant: positive  Cardiolipin abs: negative  Beta-2 glycoprotein abs: negative    12/27/2012:  Protein C functional activity: 165%  Protein S total antigen: 129%  Protein S free antigen: 127%    CTA Chest 12/26/2012: acute PE in RLL pulmonary artery, with extension into proximal segmental branches.    CTA Chest 10/31/2008: negative for PE    Bilateral LE dopplers 12/27/2012: negative    Assessment:   1) Pulmonary Embolism, provoked  Diagnosed 12/26/2012. Risk factors at that time include a recent fall, fractured wrist with upper extremity immobilization, and two 10+ hour car rides in the month prior.  She has completed 3 months of anticoagulation, and is now off therapy.    Repeat D-dimer is normal.  Additionally, lupus anticoagulant is now negative as well.  The previous positive value was likely a false-positive, which can be seen with rivaroxaban.  I see no thrombophilia that would warrant long-term anticoagulation.    She was counseled on the s/sx of VTE, as well as risk factors to avoid.      Plan:     ?? Return to see me PRN      Signed By: Marthann Schiller, MD     May 10, 2013

## 2013-05-15 ENCOUNTER — Encounter

## 2013-05-16 NOTE — Telephone Encounter (Signed)
Please call in thanks

## 2013-08-22 NOTE — Telephone Encounter (Signed)
Patient has appointment with Dr Darl PikesGlynn on 09/20/2013 she wants to make sure that you have received the lab results from [facility] from [place]. Before she comes in her no is [phone]

## 2013-08-22 NOTE — Telephone Encounter (Signed)
Patient stated [facility]13 for foot pain and swelling and patient stated the doctor did blood work because they were concerned she may have some anti-immune issues. She advised they were to fax over the blood work results for PCP to review at her appt which she has scheduled for tomorrow. I advised patient I haven't seen anything yet regarding her blood work from [facility] so I told patient to call them and fax to our direct fax line so we can have for her appt tomorrow. Patient voiced understanding.

## 2013-08-23 NOTE — Telephone Encounter (Signed)
I provided patient information to Dr. Excell Seltzerooper at [facility] who specializes with foot and ankle issues. Patient stated she called [facility] and they advised they had no one who specialized with the foot but she stated she would give them a call back and see what they say. Patient voiced thanks and understanding.

## 2013-08-23 NOTE — Progress Notes (Signed)
HISTORY OF PRESENT ILLNESS  Lindsay BillingsKathy A [name] is a 58 y.o. female.  HPI  Lindsay [name] had a fall on January 13th.  She would up wrenching her right knee and ankle.  She saw [name], ortho, was diagnosed as a sprain.  Was for a short time on crutches and using a wheelchair.  This has all healed in and seems to be doing better, however she has noticed swelling and pain in the dorsal aspect of right foot for about six days.  She went over the weekend to [facility], where xray was normal.  She had labs there, including white count of 7.3, hemoglobin of 11, platelets 257, sed rate of 20, uric acid of 4.3, rheumatoid factor 8.4, which was within normal limits, ANA negative.  She notes that years ago she had seen Dr. Manson PasseyBrown for issues with swelling in her foot.  She has not had any nodules or redness in her calf or leg.  Swelling seems to be worse at the end of the day and ice does help.      Preventive Care:  She has not had a recent mammogram.  She had a tetanus booster at [facility] in the last five years.      Review of Systems   Constitutional: Negative for fever, chills and weight loss.   Respiratory: Negative for cough, shortness of breath and wheezing.    Cardiovascular: Negative for chest pain, palpitations, orthopnea, leg swelling and PND.   Gastrointestinal: Negative for heartburn and nausea.   Musculoskeletal: Positive for joint pain and falls. Negative for myalgias.   Neurological: Negative for dizziness and headaches.       Physical Exam   Constitutional: She is oriented to person, place, and time. She appears well-developed and well-nourished.   HENT:   Head: Normocephalic and atraumatic.   Neck: Normal range of motion. Neck supple. Carotid bruit is not present. No thyromegaly present.   Cardiovascular: Normal rate, regular rhythm, S1 normal, S2 normal, normal heart sounds and intact distal pulses.    No murmur heard.  Pulmonary/Chest: Effort normal and breath sounds normal. No respiratory distress. She has  no wheezes. She has no rales.   Musculoskeletal: She exhibits no edema.   Tender over dorsal aspect of right mid foot not red or warm no sign of cellulitis and no pain around forefoot or hind foot or ankle   Lymphadenopathy:     She has no cervical adenopathy.   Neurological: She is alert and oriented to person, place, and time.   Psychiatric: She has a normal mood and affect. Her behavior is normal.   Nursing note and vitals reviewed.      ASSESSMENT and PLAN  Lindsay [name] was seen today for follow-up and advice only.    Diagnoses and associated orders for this visit:    Right foot injury-suspect all soft tissue -no sign of infection or autoimmune disease on [facility] reviewed with her only mild anemia needs follow up labs in 3 mo    Breast screening  - MAM MAMMO BI SCREENING DIGTL; Future

## 2013-08-23 NOTE — Progress Notes (Signed)
"  REVIEWED RECORD IN PREPARATION FOR VISIT AND HAVE OBTAINED NECESSARY DOCUMENTATION"

## 2013-08-23 NOTE — Telephone Encounter (Signed)
Stated that she made appt with Dr. Manson PasseyBrown on March 4th re: foot swelling. She said she cant wait two more weeks because of pain so she wants to know what can she do next. She's open to being referred to somebody else. [phone]

## 2013-09-06 MED ORDER — ESCITALOPRAM 20 MG TAB
20 mg | ORAL_TABLET | Freq: Every day | ORAL | Status: DC
Start: 2013-09-06 — End: 2014-01-07

## 2013-09-06 NOTE — Telephone Encounter (Signed)
Pt states that she is out of her escitalopram oxalate (LEXAPRO) 20 mg tablet and she would like to know if it can be filled today. Pt can be reached at [phone]. Pt uses [facility] on [address] phone [phone]

## 2013-11-05 NOTE — Telephone Encounter (Signed)
Patient had blood clot in her leg last June. And she is having back to back traveling. One Flying and One Driving. Will it be all right? She would like a call back regarding this no is [phone]

## 2013-11-06 NOTE — Telephone Encounter (Signed)
Pt called again stating that she would like advice on blood clots. Pt is going out of town tomorrow and would like to know before she goes. Pt can be reached at [phone]

## 2013-11-06 NOTE — Telephone Encounter (Signed)
V/m left for patient advising she needs to set up an appt to discuss possible med options to prevent her from getting blood clots. Please schedule patient an appt to discuss further in detail.

## 2013-11-06 NOTE — Telephone Encounter (Signed)
V/m left for patient stating per PCP she needs to start aspirin 325 daily 2 days prior to flight, and continue that regimen until the day after the flight. And repeat the same regimen on the flight back. Advised patient to return my call if she has any other questions.

## 2013-11-06 NOTE — Telephone Encounter (Signed)
Returned patient's call. I left patient a v/m stating she needs an appt to discuss her issues and & concerns in detail. Patient stated she is unable to come in as she is leaving for [place] tomorrow to head to her daughters graduation. Patient stated she is clear on the driving instructions. She states it is a 10 hour drive where she is going and her plan is to stop every half hour to walk and move her legs and will plan to do that for the drive back home. She is mainly worried about flying. She states her daughter planned a trip to [place] and they are planning to fly out next Tuesday which is the day before they get back from [place] and the flight is 9 hours long with one layover. Patient stated she wants PCP's professional opinion on whether to go on the trip or not. She understands she should have came in but she just had so much going on with family it slipped her mind.

## 2013-11-06 NOTE — Telephone Encounter (Signed)
Ok tell her to take asa 325 mg daily for 2 days prior to flight and during day of flight

## 2013-12-07 LAB — METABOLIC PANEL, COMPREHENSIVE
A-G Ratio: 1 — ABNORMAL LOW (ref 1.1–2.2)
ALT (SGPT): 24 U/L (ref 12–78)
AST (SGOT): 27 U/L (ref 15–37)
Albumin: 3.6 g/dL (ref 3.5–5.0)
Alk. phosphatase: 136 U/L — ABNORMAL HIGH (ref 45–117)
Anion gap: 8 mmol/L (ref 5–15)
BUN/Creatinine ratio: 20 (ref 12–20)
BUN: 11 MG/DL (ref 6–20)
Bilirubin, total: 0.3 MG/DL (ref 0.2–1.0)
CO2: 26 mmol/L (ref 21–32)
Calcium: 8.6 MG/DL (ref 8.5–10.1)
Chloride: 104 mmol/L (ref 97–108)
Creatinine: 0.55 MG/DL (ref 0.45–1.15)
GFR est AA: >60 mL/min/{1.73_m2} (ref >60)
GFR est non-AA: >60 mL/min/{1.73_m2} (ref >60)
Globulin: 3.7 g/dL (ref 2.0–4.0)
Glucose: 98 mg/dL (ref 65–100)
Potassium: 4.2 mmol/L (ref 3.5–5.1)
Protein, total: 7.3 g/dL (ref 6.4–8.2)
Sodium: 138 mmol/L (ref 136–145)

## 2013-12-07 LAB — CBC WITH AUTOMATED DIFF
ABS. BASOPHILS: 0 10*3/uL (ref 0.0–0.1)
ABS. EOSINOPHILS: 0.2 10*3/uL (ref 0.0–0.4)
ABS. LYMPHOCYTES: 1.6 10*3/uL (ref 0.8–3.5)
ABS. MONOCYTES: 0.4 10*3/uL (ref 0.0–1.0)
ABS. NEUTROPHILS: 3.2 10*3/uL (ref 1.8–8.0)
BASOPHILS: 0 % (ref 0–1)
EOSINOPHILS: 4 % (ref 0–7)
HCT: 34.1 % — ABNORMAL LOW (ref 35.0–47.0)
HGB: 10.7 g/dL — ABNORMAL LOW (ref 11.5–16.0)
LYMPHOCYTES: 29 % (ref 12–49)
MCH: 25.5 PG — ABNORMAL LOW (ref 26.0–34.0)
MCHC: 31.4 g/dL (ref 30.0–36.5)
MCV: 81.4 FL (ref 80.0–99.0)
MONOCYTES: 7 % (ref 5–13)
NEUTROPHILS: 60 % (ref 32–75)
PLATELET: 240 10*3/uL (ref 150–400)
RBC: 4.19 M/uL (ref 3.80–5.20)
RDW: 14.2 % (ref 11.5–14.5)
WBC: 5.3 10*3/uL (ref 3.6–11.0)

## 2013-12-07 LAB — TROPONIN I: Troponin-I, Qt.: <0.04 ng/mL (ref <0.05)

## 2013-12-07 LAB — PROTHROMBIN TIME + INR
INR: 0.9 (ref 0.9–1.1)
Prothrombin time: 9.2 s (ref 9.0–11.1)

## 2013-12-07 LAB — CREATININE, POC
Creatinine (POC): 0.8 MG/DL (ref 0.6–1.3)
GFRAA, POC: >60 mL/min/{1.73_m2} (ref >60)
GFRNA, POC: >60 mL/min/{1.73_m2} (ref >60)

## 2013-12-07 LAB — CK W/ REFLX CKMB: CK: 94 U/L (ref 26–192)

## 2013-12-07 LAB — NT-PRO BNP: NT pro-BNP: 80 PG/ML (ref 0–125)

## 2013-12-07 LAB — PTT: aPTT: 26.1 s (ref 22.1–32.5)

## 2013-12-07 MED ORDER — IOPAMIDOL 76 % IV SOLN
370 mg iodine /mL (76 %) | Freq: Once | INTRAVENOUS | Status: AC
Start: 2013-12-07 — End: 2013-12-07
  Administered 2013-12-07: 18:00:00 via INTRAVENOUS

## 2013-12-07 MED ORDER — SODIUM CHLORIDE 0.9 % IJ SYRG
Freq: Once | INTRAMUSCULAR | Status: AC
Start: 2013-12-07 — End: 2013-12-07
  Administered 2013-12-07: 18:00:00 via INTRAVENOUS

## 2013-12-07 MED ADMIN — 0.9% sodium chloride infusion: INTRAVENOUS | @ 18:00:00 | NDC 00409798303

## 2013-12-07 MED FILL — SODIUM CHLORIDE 0.9 % IV: INTRAVENOUS | Qty: 1000

## 2013-12-07 MED FILL — BD POSIFLUSH NORMAL SALINE 0.9 % INJECTION SYRINGE: INTRAMUSCULAR | Qty: 10

## 2013-12-07 MED FILL — ISOVUE-370  76 % INTRAVENOUS SOLUTION: 370 mg iodine /mL (76 %) | INTRAVENOUS | Qty: 100

## 2013-12-07 NOTE — ED Notes (Signed)
Patient has been given discharge instructions by ER MD. Patient has been given opportunity to ask questions. Discharge instructions were reinforced by nurse and patient has verbalized understanding.

## 2013-12-07 NOTE — ED Notes (Signed)
POC create is 0.8

## 2013-12-07 NOTE — ED Provider Notes (Signed)
HPI Comments: 58 yo F with a history of PE and Depression presents with shortness of breath. Reportedly patient has been travelling this month, 9 hour car ride to and from [place] followed by a trip to [place] from which she returned 10 days ago. Since then patient has felt tired which she attributed to jet lag. Over the past couple of days she has felt generalized weakness, and intermittent short of breath that is worse with exertion and laying down. Yesterday patient had a mechanical fall and [facility] and was found to have "low" oxygen saturation but this reportedly improved prior to the patient going home. Today patient felt short of breath and noted right sided inferior chest pain described as a heaviness so she called [facility] but they asked her to come to the ED. Patient says she feels similar to how she felt when she was diagnosed with a PE last year. She is no longer on anticoagulation - she was treated with Xarelto for 4 months then taken off this medication). Denies fever, chills, dizziness, nausea, vomiting, diarrhea, dysuria. She has bilateral LE edema but denies LE pain.     Patient is a 58 y.o. female presenting with shortness of breath. The history is provided by the patient.   Shortness of Breath  Associated symptoms include chest pain and leg swelling. Pertinent negatives include no fever, no rhinorrhea, no cough, no wheezing, no vomiting, no abdominal pain and no rash.        Past Medical History   Diagnosis Date   ??? Headache(784.0) 2011   ??? Depression    ??? Depression    ??? Other and unspecified hyperlipidemia 12/27/2012        Past Surgical History   Procedure Laterality Date   ??? Hx other surgical  2010     left foot torn tendon   ??? Hx cesarean section     ??? Hx appendectomy     ??? Hx orthopaedic       rotater cuff r. shoulder   ??? Hx orthopaedic       left ankle fracture treated with air cast   ??? Hx orthopaedic  6/13     left shoulder fxt and dislocation          Family History   Problem Relation Age of Onset   ??? Heart Disease Mother    ??? Heart Disease Father         History     Social History   ??? Marital Status: MARRIED     Spouse Name: N/A     Number of Children: N/A   ??? Years of Education: N/A     Occupational History   ??? Not on file.     Social History Main Topics   ??? Smoking status: Never Smoker    ??? Smokeless tobacco: Never Used   ??? Alcohol Use: No   ??? Drug Use: No   ??? Sexual Activity:     Partners: Male     Other Topics Concern   ??? Not on file     Social History Narrative                  ALLERGIES: Review of patient's allergies indicates no known allergies.      Review of Systems   Constitutional: Negative for fever and chills.   HENT: Negative for congestion and rhinorrhea.    Eyes: Negative for visual disturbance.   Respiratory:  Positive for shortness of breath. Negative for cough and wheezing.    Cardiovascular: Positive for chest pain and leg swelling. Negative for palpitations.   Gastrointestinal: Negative for nausea, vomiting and abdominal pain.   Endocrine: Negative for polydipsia and polyuria.   Genitourinary: Negative for dysuria and hematuria.   Musculoskeletal: Positive for back pain. Negative for myalgias.   Skin: Negative for pallor and rash.   Neurological: Positive for weakness. Negative for dizziness.   Psychiatric/Behavioral: Negative for confusion.       Filed Vitals:    12/07/13 1212   BP: 114/65   Pulse: 78   Temp: 98.1 ??F (36.7 ??C)   Resp: 16   SpO2: 94%            Physical Exam   Constitutional: She is oriented to person, place, and time. She appears well-developed and well-nourished.   HENT:   Head: Normocephalic and atraumatic.   Mouth/Throat: Oropharynx is clear and moist.   Eyes: Conjunctivae are normal. Pupils are equal, round, and reactive to light.   Neck: Normal range of motion. Neck supple.   Cardiovascular: Normal rate, regular rhythm, normal heart sounds and intact distal pulses.    No murmur heard.  Pulmonary/Chest: Effort normal  and breath sounds normal. No respiratory distress. She has no wheezes. She has no rales. She exhibits tenderness.   Abdominal: Soft. Bowel sounds are normal. There is no tenderness.   Musculoskeletal: Normal range of motion. She exhibits edema.   Neurological: She is alert and oriented to person, place, and time.   Skin: Skin is warm and dry. No rash noted. She is not diaphoretic.   Psychiatric: She has a normal mood and affect. Judgment normal.        MDM  Number of Diagnoses or Management Options  Diagnosis management comments: 58 yo F with a history of PE now off anticoagulation here with shortness of breath and right sided chest pain with recent long travel. Patient endorses that she feels similar to when she had a PE previously. Differential includes: PE, Chest Wall Pain, Anxiety, ACS, Pneumonia, Pulmonary Edema. Will check CBC, CMP, Mg, Trop, CK,-MB, BNP, and given history and risk factors, will proceed with CTA for PE.        Amount and/or Complexity of Data Reviewed  Clinical lab tests: ordered  Tests in the radiology section of CPT??: ordered        Procedures    EKG interpretation: (Preliminary) 1209  Rhythm: normal sinus rhythm; and regular . Rate (approx.): 63; Axis: normal; P wave: normal; QRS interval: low voltage; ST/T wave: normal; Other findings: normal.   Written by Rickey Barbara, ED Scribe, as dictated by Luther Hearing, MD.        LABORATORY TESTS:  Recent Results (from the past 12 hour(s))   EKG, 12 LEAD, INITIAL    Collection Time     12/07/13 12:09 PM       Result Value Ref Range    Ventricular Rate 63      Atrial Rate 63      P-R Interval 132      QRS Duration 74      Q-T Interval 396      QTC Calculation (Bezet) 405      Calculated P Axis 65      Calculated R Axis 11      Calculated T Axis 19      Diagnosis        Value: Normal sinus rhythm  Low voltage QRS  When compared with ECG of 31-Dec-2012 15:28,  Nonspecific T wave abnormality now evident in Anterior leads     CBC WITH AUTOMATED  DIFF    Collection Time     12/07/13 12:34 PM       Result Value Ref Range    WBC 5.3  3.6 - 11.0 K/uL    RBC 4.19  3.80 - 5.20 M/uL    HGB 10.7 (*) 11.5 - 16.0 g/dL    HCT 34.1 (*) 35.0 - 47.0 %    MCV 81.4  80.0 - 99.0 FL    MCH 25.5 (*) 26.0 - 34.0 PG    MCHC 31.4  30.0 - 36.5 g/dL    RDW 14.2  11.5 - 14.5 %    PLATELET 240  150 - 400 K/uL    NEUTROPHILS 60  32 - 75 %    LYMPHOCYTES 29  12 - 49 %    MONOCYTES 7  5 - 13 %    EOSINOPHILS 4  0 - 7 %    BASOPHILS 0  0 - 1 %    ABS. NEUTROPHILS 3.2  1.8 - 8.0 K/UL    ABS. LYMPHOCYTES 1.6  0.8 - 3.5 K/UL    ABS. MONOCYTES 0.4  0.0 - 1.0 K/UL    ABS. EOSINOPHILS 0.2  0.0 - 0.4 K/UL    ABS. BASOPHILS 0.0  0.0 - 0.1 K/UL   METABOLIC PANEL, COMPREHENSIVE    Collection Time     12/07/13 12:34 PM       Result Value Ref Range    Sodium 138  136 - 145 mmol/L    Potassium 4.2  3.5 - 5.1 mmol/L    Chloride 104  97 - 108 mmol/L    CO2 26  21 - 32 mmol/L    Anion gap 8  5 - 15 mmol/L    Glucose 98  65 - 100 mg/dL    BUN 11  6 - 20 MG/DL    Creatinine 0.55  0.45 - 1.15 MG/DL    BUN/Creatinine ratio 20  12 - 20      GFR est AA >60  >60 ml/min/1.61m2    GFR est non-AA >60  >60 ml/min/1.45m2    Calcium 8.6  8.5 - 10.1 MG/DL    Bilirubin, total 0.3  0.2 - 1.0 MG/DL    ALT 24  12 - 78 U/L    AST 27  15 - 37 U/L    Alk. phosphatase 136 (*) 45 - 117 U/L    Protein, total 7.3  6.4 - 8.2 g/dL    Albumin 3.6  3.5 - 5.0 g/dL    Globulin 3.7  2.0 - 4.0 g/dL    A-G Ratio 1.0 (*) 1.1 - 2.2     TROPONIN I    Collection Time     12/07/13 12:34 PM       Result Value Ref Range    Troponin-I, Qt. <0.04  <0.05 ng/mL   CK W/ REFLX CKMB    Collection Time     12/07/13 12:34 PM       Result Value Ref Range    CK 94  26 - 192 U/L   PROTHROMBIN TIME + INR    Collection Time     12/07/13 12:34 PM       Result Value Ref Range    INR 0.9  0.9 - 1.1      Prothrombin time 9.2  9.0 - 11.1 sec   PTT    Collection Time     12/07/13 12:34 PM       Result Value Ref Range    aPTT 26.1  22.1 - 32.5 sec    aPTT,  therapeutic range      58.0 - 77.0 SECS   PRO-BNP    Collection Time     12/07/13 12:34 PM       Result Value Ref Range    NT pro-BNP 80  0 - 125 PG/ML   POC CREATININE    Collection Time     12/07/13 12:49 PM       Result Value Ref Range    Creatinine (POC) 0.8  0.6 - 1.3 MG/DL    GFR-AA (POC) >60  >60 ml/min/1.28m    GFR, non-AA (POC) >60  >60 ml/min/1.72m      IMAGING RESULTS:    MEDICATIONS GIVEN:  Medications   0.9% sodium chloride infusion (50 mL/hr IntraVENous Given 12/07/13 1353)   sodium chloride (NS) flush 10 mL (10 mL IntraVENous Given 12/07/13 1353)   iopamidol (ISOVUE-370) 76 % injection 100 mL (100 mL IntraVENous Given 12/07/13 1353)       IMPRESSION:  1. Dyspnea    2. Atypical chest pain        PLAN:  1. Follow up with PCP  2. Discharge home  3. Return to ED if worse     DISCHARGE NOTE:  3:01 PM  The patient's results have been reviewed with family and/or caregiver. They verbally convey their understanding and agreement of the patient's signs, symptoms, diagnosis, treatment and prognosis and additionally agree to follow up as recommended in the discharge instructions or to return to the Emergency Room should the patient's condition change prior to their follow-up appointment. The family and/or caregiver verbally agrees with the care-plan and all of their questions have been answered. The discharge instructions have also been provided to the them with educational information regarding the patient's diagnosis as well a list of reasons why the patient would want to return to the ER prior to their follow-up appointment should their condition change.  Written by DaDorene SorrowED Scribe, as dictated by AnEzequiel GanserMD.

## 2013-12-09 LAB — EKG, 12 LEAD, INITIAL
Atrial Rate: 63 {beats}/min
Calculated P Axis: 65 degrees
Calculated R Axis: 11 degrees
Calculated T Axis: 19 degrees
P-R Interval: 132 ms
Q-T Interval: 396 ms
QRS Duration: 74 ms
QTC Calculation (Bezet): 405 ms
Ventricular Rate: 63 {beats}/min

## 2013-12-12 NOTE — Progress Notes (Signed)
Patient on discharge report dated 12/07/13 from [facility]. Diagnosis: dyspnea. Left message on voicemail.  Will attempt to contact again.  Need to complete post-discharge assessment.

## 2013-12-14 ENCOUNTER — Encounter

## 2013-12-14 NOTE — Telephone Encounter (Signed)
Pt states she needs refill of diazepam 10mg . She states the dr increased dosage    Requested Prescriptions     Pending Prescriptions Disp Refills   ??? diazepam (VALIUM) 2 mg tablet 60 Tab 0     Sig: Take 1 Tab by mouth two (2) times daily as needed for Anxiety.

## 2013-12-14 NOTE — Telephone Encounter (Signed)
V/m left for patient advising per PCP her current dose of valium is 2 mg, not 10 mg. I advised if another doctor increased it she will need to ask them for a refill or schedule an appt to discuss a change in dosage. Please schedule patient an appt if she wants to discuss an increased dose. I did note we can send the 2 mg in for her if she wishes.

## 2013-12-14 NOTE — Telephone Encounter (Signed)
I have been giving her the 2 mg dose-if someone else bumped the dose she needs to see me in office or get refill from the other doctor-

## 2013-12-21 ENCOUNTER — Encounter

## 2013-12-21 MED ORDER — DIAZEPAM 2 MG TAB
2 mg | ORAL_TABLET | Freq: Two times a day (BID) | ORAL | Status: DC | PRN
Start: 2013-12-21 — End: 2014-01-03

## 2013-12-21 MED ORDER — DIAZEPAM 10 MG TAB
10 mg | ORAL_TABLET | ORAL | Status: DC
Start: 2013-12-21 — End: 2014-01-03

## 2013-12-21 NOTE — Telephone Encounter (Signed)
-----   Message from Diona BrownerJanae L Gillyard sent at 12/21/2013  9:21 AM EDT -----  Regarding: Dr. Jacquelyne BalintGlynn/Refill   The patient is requesting that the doctor calls in a refill for Rx Diazopam as soon as possible. The patient will be leaving town today until Tuesday and would like this Rx available before she leaves. The patient is requesting that the doctor calls in this Rx at 9am this morning. Please call the patient when this is done or with any concerns. [phone] ([facility])[phone]

## 2013-12-21 NOTE — Telephone Encounter (Signed)
Message left with patient we did receive a request from [facility] for the 10 mg valium. Per Dr.Glynn her current dose is 2 mg. I advised we can call in the 2 mg but not the 10 mg. Patient will need an appt to discuss a dosage change. Refill request sent to Dr.Glynn for the 2 mg dose for review.

## 2014-01-03 MED ORDER — DIAZEPAM 10 MG TAB
10 mg | ORAL_TABLET | ORAL | Status: DC
Start: 2014-01-03 — End: 2015-12-10

## 2014-01-03 NOTE — Progress Notes (Signed)
HISTORY OF PRESENT ILLNESS  Rosalene BillingsKathy A Klees is a 58 y.o. female.  HPI  [name] is seen to discuss anxiety.  She had been getting her meds from me primarily, however she is now seeing Dr. Theodis ShoveElliott Spanier about every weight weeks.  He is prescribing her Lexapro and Wellbutrin and is certainly willing to take over on the Valium, but there was confusion about her Valium dosing.  We had given her a 2 mg, but prior to that she'd had a 10.  We did call in 20 of the 10 mg tablets on June 19th.  She is almost out of these and I have written for an additional 30 to last until she can see Dr. Salley HewsSpanier, with the understanding that then I will not be writing this medication.  She is aware of the restrictions on benzodiazepines.  She has had extraordinary stress caring for her dad, who has been in a motor vehicle accident and is in an assisted living facility, husband who has had atrial fibrillation recurrence and required defibrillation.      Review of Systems   Psychiatric/Behavioral: Negative for depression, suicidal ideas, hallucinations and substance abuse. The patient is nervous/anxious.        Physical Exam   Constitutional: She is oriented to person, place, and time. She appears well-developed and well-nourished.   Neurological: She is alert and oriented to person, place, and time.   Psychiatric: She has a normal mood and affect. Her behavior is normal. Judgment and thought content normal.   Nursing note and vitals reviewed.      ASSESSMENT and PLAN  [name] was seen today for medication evaluation.    Diagnoses and associated orders for this visit:    Anxiety-significant stressors and inc anxiety will see dr spanier in next 2-3 weeks her new psychiatrist and he will take over on writing for the valium in interim I did write for # 30 tablets to last until her appt   Over 50% of the 15 minutes face to face with Rosalene BillingsKathy A Palmer consisted of counseling and/or discussing treatment plans in reference to her meds.               - diazepam (VALIUM) 10 mg tablet; Take 1 tablet by mouth daily for anxiety

## 2014-01-03 NOTE — Progress Notes (Signed)
Chief Complaint   Patient presents with   ??? Medication Evaluation     Diazepam       Have you been to the ER or urgent care clinic since your last visit? Pt was seen at [facility] ED on 12/07/13 for dyspnea.     Have you been hospitalized since your last visit? No    Have you been seen or consulted any other health care provider outside of [facility] since your last visit (including pap smears, colonoscopy screening)? No    I have reviewed the patient's medical history in detail and updated the computerized patient record.

## 2014-02-05 LAB — CREATININE, POC
Creatinine (POC): 0.8 MG/DL (ref 0.6–1.3)
GFRAA, POC: >60 mL/min/{1.73_m2} (ref >60)
GFRNA, POC: >60 mL/min/{1.73_m2} (ref >60)

## 2014-02-05 LAB — METABOLIC PANEL, COMPREHENSIVE
A-G Ratio: 1 — ABNORMAL LOW (ref 1.1–2.2)
ALT (SGPT): 19 U/L (ref 12–78)
AST (SGOT): 16 U/L (ref 15–37)
Albumin: 3.3 g/dL — ABNORMAL LOW (ref 3.5–5.0)
Alk. phosphatase: 143 U/L — ABNORMAL HIGH (ref 45–117)
Anion gap: 9 mmol/L (ref 5–15)
BUN/Creatinine ratio: 23 — ABNORMAL HIGH (ref 12–20)
BUN: 16 MG/DL (ref 6–20)
Bilirubin, total: <0.1 MG/DL — ABNORMAL LOW (ref 0.2–1.0)
CO2: 24 mmol/L (ref 21–32)
Calcium: 8.5 MG/DL (ref 8.5–10.1)
Chloride: 104 mmol/L (ref 97–108)
Creatinine: 0.69 MG/DL (ref 0.45–1.15)
GFR est AA: >60 mL/min/{1.73_m2} (ref >60)
GFR est non-AA: >60 mL/min/{1.73_m2} (ref >60)
Globulin: 3.2 g/dL (ref 2.0–4.0)
Glucose: 113 mg/dL — ABNORMAL HIGH (ref 65–100)
Potassium: 3.9 mmol/L (ref 3.5–5.1)
Protein, total: 6.5 g/dL (ref 6.4–8.2)
Sodium: 137 mmol/L (ref 136–145)

## 2014-02-05 LAB — CBC WITH AUTOMATED DIFF
ABS. BASOPHILS: 0 10*3/uL (ref 0.0–0.1)
ABS. EOSINOPHILS: 0.3 10*3/uL (ref 0.0–0.4)
ABS. LYMPHOCYTES: 2.6 10*3/uL (ref 0.8–3.5)
ABS. MONOCYTES: 0.4 10*3/uL (ref 0.0–1.0)
ABS. NEUTROPHILS: 3.3 10*3/uL (ref 1.8–8.0)
BASOPHILS: 1 % (ref 0–1)
EOSINOPHILS: 5 % (ref 0–7)
HCT: 31 % — ABNORMAL LOW (ref 35.0–47.0)
HGB: 9.9 g/dL — ABNORMAL LOW (ref 11.5–16.0)
LYMPHOCYTES: 39 % (ref 12–49)
MCH: 26 PG (ref 26.0–34.0)
MCHC: 31.9 g/dL (ref 30.0–36.5)
MCV: 81.4 FL (ref 80.0–99.0)
MONOCYTES: 6 % (ref 5–13)
NEUTROPHILS: 49 % (ref 32–75)
PLATELET: 238 10*3/uL (ref 150–400)
RBC: 3.81 M/uL (ref 3.80–5.20)
RDW: 14.2 % (ref 11.5–14.5)
WBC: 6.6 10*3/uL (ref 3.6–11.0)

## 2014-02-05 LAB — PROTHROMBIN TIME + INR
INR: 0.9 (ref 0.9–1.1)
Prothrombin time: 9.1 s (ref 9.0–11.1)

## 2014-02-05 LAB — TROPONIN I: Troponin-I, Qt.: <0.04 ng/mL (ref <0.05)

## 2014-02-05 LAB — PTT: aPTT: 26.3 s (ref 22.1–32.5)

## 2014-02-05 MED ORDER — SODIUM CHLORIDE 0.9 % IJ SYRG
Freq: Once | INTRAMUSCULAR | Status: AC
Start: 2014-02-05 — End: 2014-02-05
  Administered 2014-02-05: 08:00:00 via INTRAVENOUS

## 2014-02-05 MED ORDER — DIAZEPAM 5 MG TAB
5 mg | ORAL | Status: AC
Start: 2014-02-05 — End: 2014-02-05
  Administered 2014-02-05: 09:00:00 via ORAL

## 2014-02-05 MED ORDER — IOPAMIDOL 76 % IV SOLN
370 mg iodine /mL (76 %) | Freq: Once | INTRAVENOUS | Status: AC
Start: 2014-02-05 — End: 2014-02-05
  Administered 2014-02-05: 08:00:00 via INTRAVENOUS

## 2014-02-05 MED ADMIN — 0.9% sodium chloride infusion: INTRAVENOUS | @ 08:00:00 | NDC 00409798303

## 2014-02-05 MED FILL — ISOVUE-370  76 % INTRAVENOUS SOLUTION: 370 mg iodine /mL (76 %) | INTRAVENOUS | Qty: 100

## 2014-02-05 MED FILL — BD POSIFLUSH NORMAL SALINE 0.9 % INJECTION SYRINGE: INTRAMUSCULAR | Qty: 10

## 2014-02-05 MED FILL — SODIUM CHLORIDE 0.9 % IV: INTRAVENOUS | Qty: 1000

## 2014-02-05 MED FILL — DIAZEPAM 5 MG TAB: 5 mg | ORAL | Qty: 1

## 2014-02-05 NOTE — ED Notes (Signed)
Pt to ultrasound

## 2014-02-05 NOTE — ED Notes (Signed)
Dr Obier reviewed discharge instructions with the patient.  The patient verbalized understanding.  All questions and concerns were addressed.  The patient is discharged ambulatory in the care of family members with instructions and prescriptions in hand.  Pt is alert and oriented x 4.  Respirations are clear and unlabored.

## 2014-02-05 NOTE — ED Provider Notes (Signed)
HPI Comments: 58YO CF with PmHx, psych and PE presents with complaints of acute onset right posterior knee pain, intermittent dyspnea, right sided back pain and mild epigastric chest pain.  Patient also complains of one month dry cough.  Patient states only current discomfort is the leg pain and right sided back pain.  Patient states previously on rivaroxiban, not currently anticoagulated.  Patient denies family hx of clotting disorder, denies long periods of immobility, but does states she is currently less active than normal 2/2 sick relative.    Patient is a 58 y.o. female presenting with leg pain. The history is provided by the patient. No language interpreter was used.   Leg Pain   This is a new problem. The current episode started 3 to 5 hours ago. The problem occurs constantly. The problem has not changed since onset.The pain is present in the right knee. The pain is at a severity of 4/10. The pain is mild. Associated symptoms include back pain. Pertinent negatives include no numbness, full range of motion, no stiffness, no tingling, no itching and no neck pain. The symptoms are aggravated by palpation. She has tried nothing for the symptoms. There has been no history of extremity trauma.        Past Medical History   Diagnosis Date   ??? Headache(784.0) 2011   ??? Depression    ??? Depression    ??? Other and unspecified hyperlipidemia 12/27/2012        Past Surgical History   Procedure Laterality Date   ??? Hx other surgical  2010     left foot torn tendon   ??? Hx cesarean section     ??? Hx appendectomy     ??? Hx orthopaedic       rotater cuff r. shoulder   ??? Hx orthopaedic       left ankle fracture treated with air cast   ??? Hx orthopaedic  6/13     left shoulder fxt and dislocation         Family History   Problem Relation Age of Onset   ??? Heart Disease Mother    ??? Heart Disease Father         History     Social History   ??? Marital Status: MARRIED     Spouse Name: N/A     Number of Children: N/A    ??? Years of Education: N/A     Occupational History   ??? Not on file.     Social History Main Topics   ??? Smoking status: Never Smoker    ??? Smokeless tobacco: Never Used   ??? Alcohol Use: Yes      Comment: occ   ??? Drug Use: No   ??? Sexual Activity:     Partners: Male     Other Topics Concern   ??? Not on file     Social History Narrative                  ALLERGIES: Review of patient's allergies indicates no known allergies.      Review of Systems   Constitutional: Positive for fatigue.   HENT: Negative.    Respiratory: Positive for cough and shortness of breath.    Cardiovascular: Positive for chest pain and leg swelling.   Gastrointestinal: Positive for abdominal pain.   Genitourinary: Negative.    Musculoskeletal: Positive for back pain. Negative for stiffness and neck pain.   Skin: Negative for itching.   Neurological:  Negative.  Negative for tingling and numbness.       Filed Vitals:    02/05/14 0202   BP: 127/86   Pulse: 75   Temp: 98.4 ??F (36.9 ??C)   Resp: 16   Height: 5\' 3"  (1.6 m)   Weight: 80.5 kg (177 lb 7.5 oz)   SpO2: 99%            Physical Exam   Constitutional: She is oriented to person, place, and time. She appears well-nourished. No distress.   HENT:   Head: Normocephalic and atraumatic.   Eyes: Conjunctivae and EOM are normal. Pupils are equal, round, and reactive to light.   Cardiovascular: Normal rate and regular rhythm.  Exam reveals friction rub. Exam reveals no gallop.    No murmur heard.  Pulmonary/Chest: Effort normal and breath sounds normal. No respiratory distress. She has no wheezes. She exhibits no tenderness.   Abdominal: Soft. Bowel sounds are normal. She exhibits no distension. There is no tenderness.   Musculoskeletal: Normal range of motion.        Legs:  Neurological: She is alert and oriented to person, place, and time.   Skin: Skin is warm and dry. She is not diaphoretic.        MDM  Number of Diagnoses or Management Options   Diagnosis management comments: 58YO CF with PmHx, ROS and PE as above concerning for RLE pain and swelling associated with cough, chest and back pain.  Patient at high risk for DVT/PE given hx of the same, no known precipitant for clotting d/o.  DDx includes MSK discomfort, trauma (which patient denies), ACS, PNA, anxiety.  Plan for CBC, CMP, PTT, CE's, and PE study.  Will reevaluate and tx based on findings.    7:15 AM  No significant abnormalities on lab not imaging including negative DVT study and negative PE CT, negative CE x1.  Results discussed with patient, will d/c home with PCP f/u.  Gwynneth AlbrightF. Jerome Jacier Gladu, MD  PGY2 Emergency Medicine    Procedures

## 2014-02-05 NOTE — ED Notes (Signed)
Assumed care of patient who is lying quietly on the stretcher in no apparent distress.  Pt is alert and oriented x 4.  Respirations are even and unlabored.  No needs are expressed at this time.

## 2014-02-05 NOTE — ED Notes (Signed)
Pt complains of pain behind her right knee starting tonight when she laid down to go to bed. Before dinner pt's legs felt weak and the pain started later. Pt also complains of right lower back pain with hx of PE. Pt denies any strenuous activities or injury. Pt in no apparent distress at this time. Pt placed on monitor x2, call bell within reach and plan of care discussed.

## 2014-02-05 NOTE — ED Notes (Signed)
Report given to Diane O. RN.

## 2014-05-21 ENCOUNTER — Encounter: Attending: Internal Medicine | Primary: Internal Medicine

## 2014-06-03 ENCOUNTER — Encounter: Attending: Internal Medicine | Primary: Internal Medicine

## 2014-06-30 NOTE — Progress Notes (Signed)
Resolving episode.

## 2014-08-31 IMAGING — CT CT THORAX WO CONTRAST
2 of 4 series · 15 of 36 positions shown, 18 images · non-contrast
Comparison: None available.

INDICATION: Abnormal chest x-ray.
TECHNIQUE: CT of the thorax was performed without intravenous contrast.

[Series 3: lung · axial · 0.69mm/px · z∈[-304,-64]mm · 12 of 58 slices shown, 15 images]
[im 5/58  mediastinal]
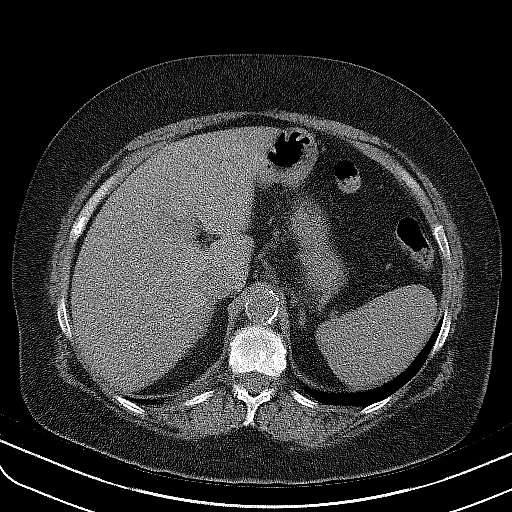
[im 5/58  lung]
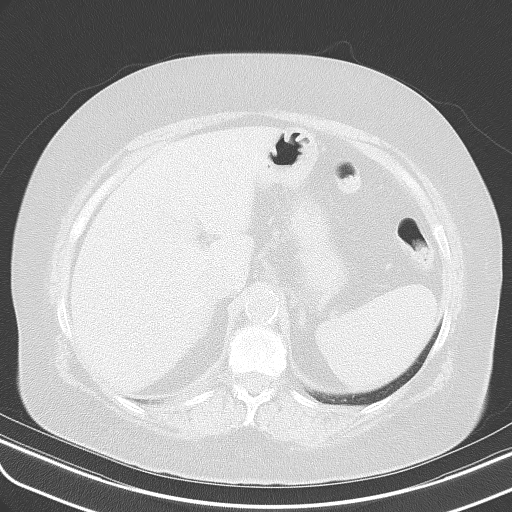
[im 9/58  lung]
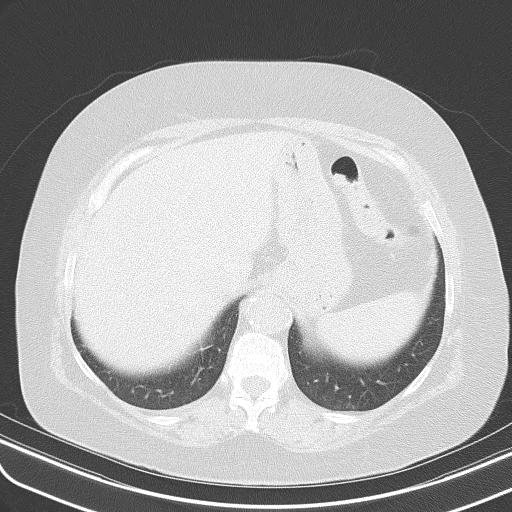
[im 14/58  lung]
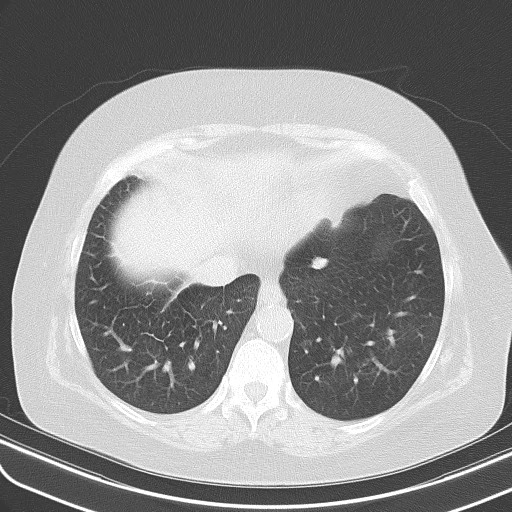
[im 18/58  lung]
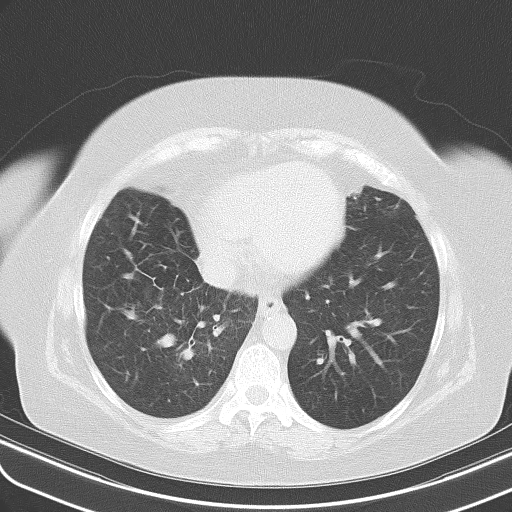
[im 22/58  mediastinal]
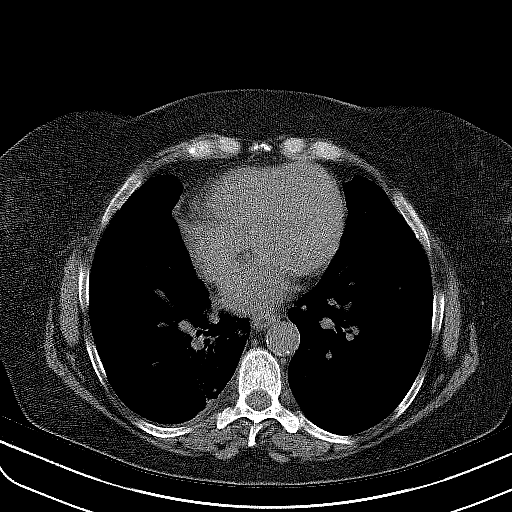
[im 22/58  lung]
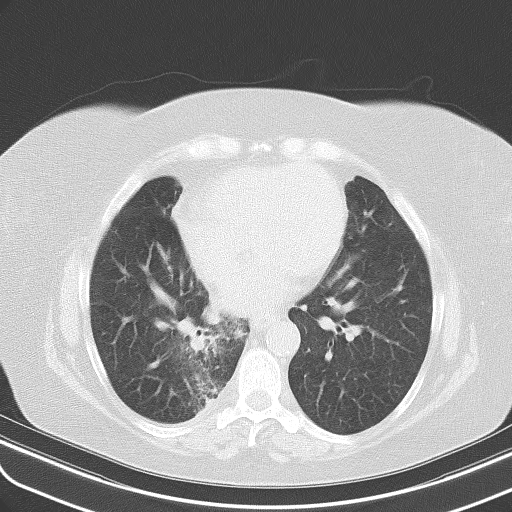
[im 27/58  lung]
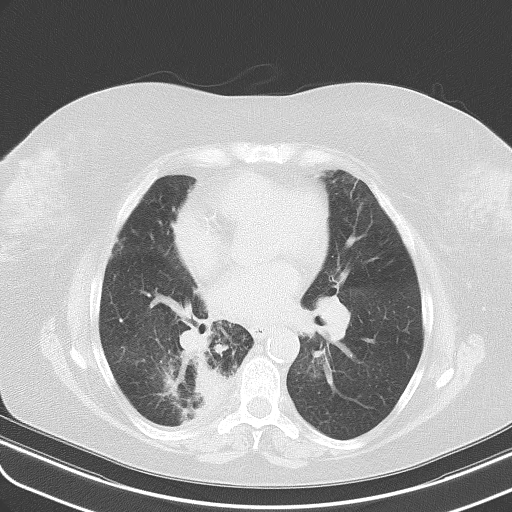
[im 31/58  lung]
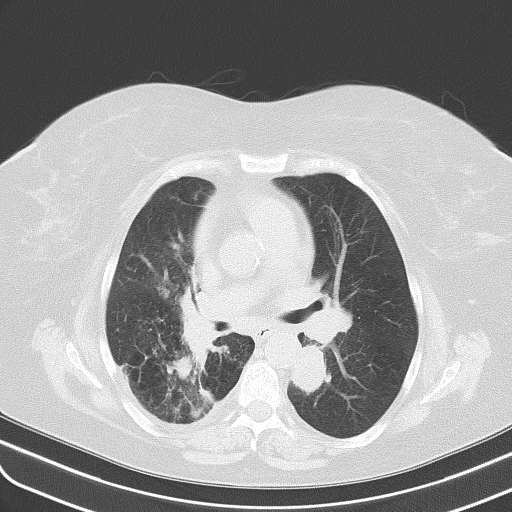
[im 36/58  lung]
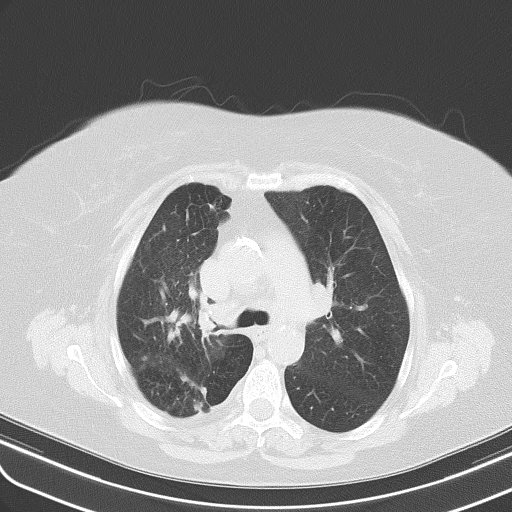
[im 40/58  mediastinal]
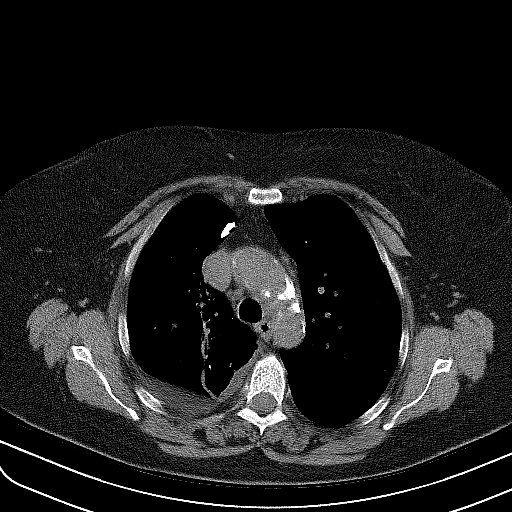
[im 40/58  lung]
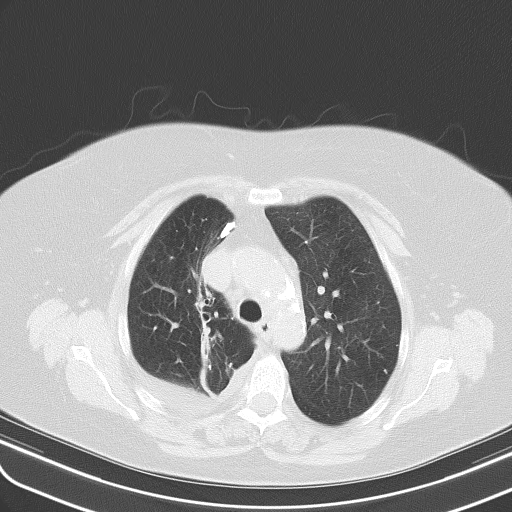
[im 44/58  lung]
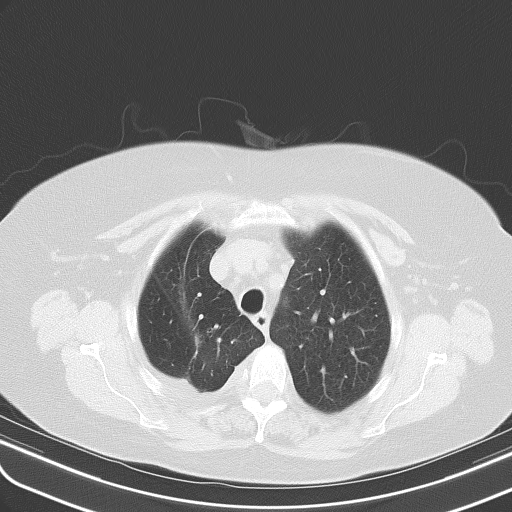
[im 49/58  lung]
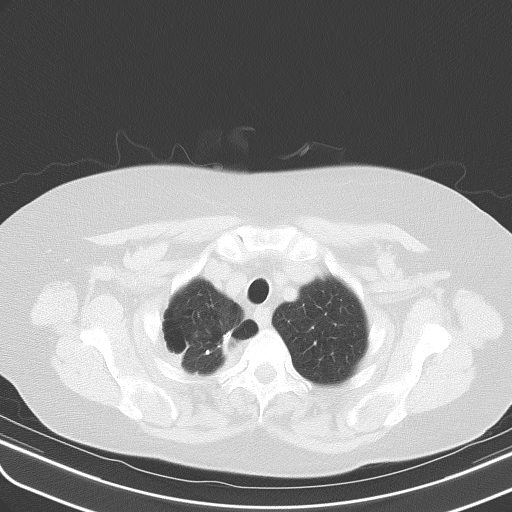
[im 53/58  lung]
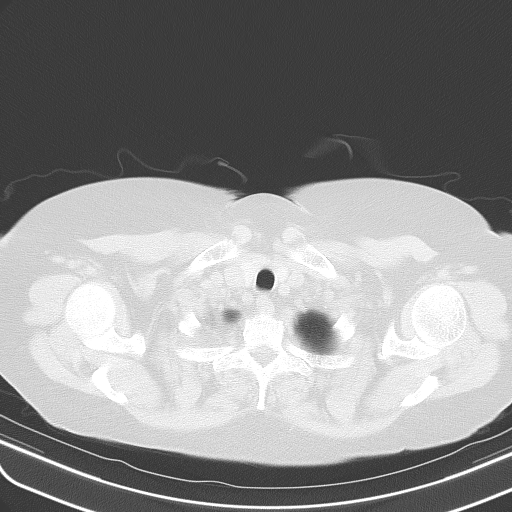

[Series 4: coronal · coronal · 0.62mm/px · 3 of 52 slices shown]
[im 11/52  lung]
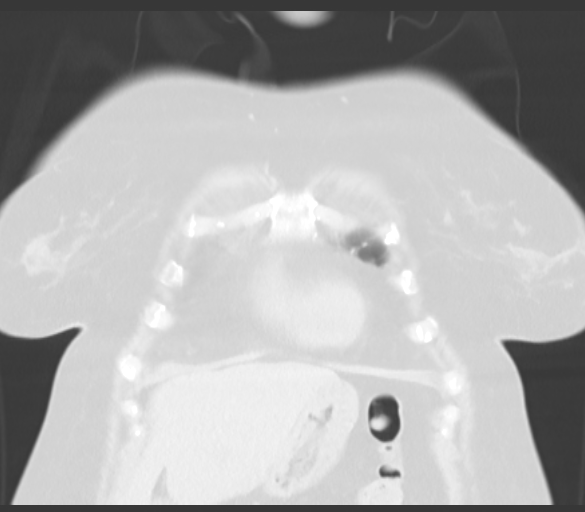
[im 21/52  lung]
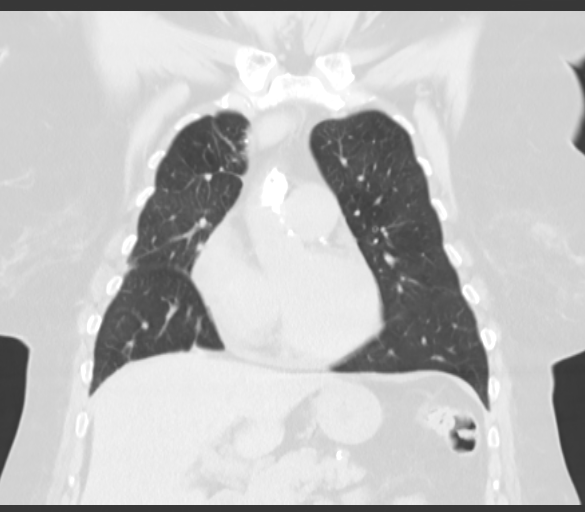
[im 31/52  lung]
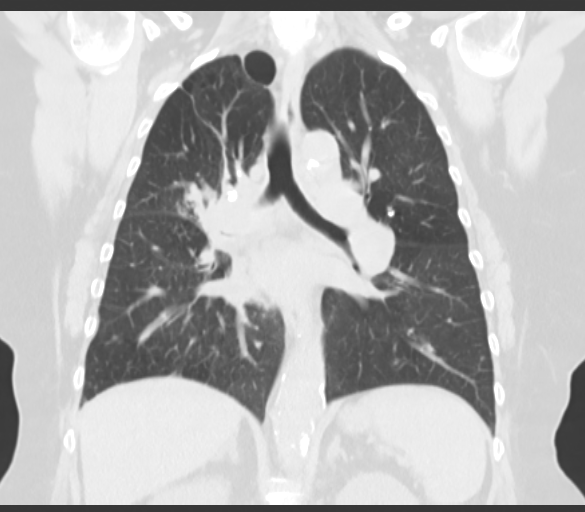

[15 of 36 positions shown; findings below may reference images not displayed]

FINDINGS: The lungs demonstrate mild emphysematous changes at the apices.

Within the superior medial left lower lobe there is a 3.1 x 2.4 cm mass. At the left hila there is a suspected enlarged hilar lymph node measuring up to 1.5 cm in short axis; however, the exam is limited by lack of contrast and this could also simply represent a nonopacified vessel.

Scattered calcified granulomata are seen scattered throughout both lungs with multiple other small noncalcified nodules seen bilaterally. Nodular parenchymal calcification is noted within the superior right lower lobe with associated volume loss and scarring. Calcified right hilar lymph nodes are noted and there is narrowing of the bronchi at the right hilum. A few small calcified mediastinal lymph nodes are present.

There is a trace right pleural effusion.

The thoracic inlet is unremarkable. There is no axillary lymphadenopathy. The heart size is normal. Coronary artery calcifications are present. Atherosclerotic calcifications affect the thoracic aorta which is normal in caliber.

The partially visualized upper abdomen is grossly unremarkable. The bones appear osteopenic. No suspicious osseous lesions are seen.
IMPRESSION: 1. 3.1 cm mass within the superior-medial left lower lobe with possible left hilar lymphadenopathy suspicious for lung cancer.

2. Additionally, numerous calcified granulomata are present within both lungs most pronounced on the right along with numerous small noncalcified bilateral pulmonary nodules. Calcified right hilar lymph nodes and soft tissue density result in narrowing of the right bronchi at the hilum. These findings suggest fibrosing mediastinitis affecting the right hilum secondary to prior granulomatous disease such as histoplasmosis. Patchy nodular opacities within the right lower lobe most likely represent a postobstructive infectious process.

3. Due to the complicated appearance secondary to prior granulomatous disease, further evaluation with PET CT should be considered.

ABNORMAL FINDINGS!

## 2014-09-13 IMAGING — PT PET^1_PETCT_UltraHD (Adult)
1 series · 25 of 25 positions shown · non-contrast
Comparison: none

HISTORY: Left lung nodule seen on chest CT. The patient has had productive cough for one year and has underlying COPD. PET CT evaluation desired. Surgical history is notable for appendectomy and hysterectomy with BSO. The patient had TB in 3127.

Comparative Studies:  CT of the chest from [HOSPITAL] dated 07/27/12.
TECHNIQUE: The patient's serum glucose was 89 mg/dl at time of the study.  The patient was injected with 12.3 mCi F-18 fluorodeoxyglucose in the right antecubital fossa.  The patient rested quietly for 83 minutes and then received attenuation corrected PET CT imaging from the skull base to mid thigh. PET, CT, and fused images were reviewed at the reading station.

[pet/ct mip movie · axial · 1.0mm · 3.00mm/px · z∈[-24,+24]mm · 25 of 48 slices shown]
[im 1/48]
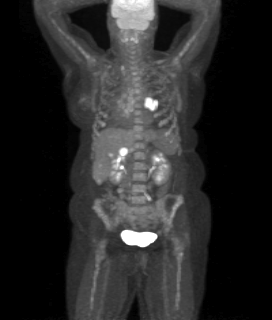
[im 2/48]
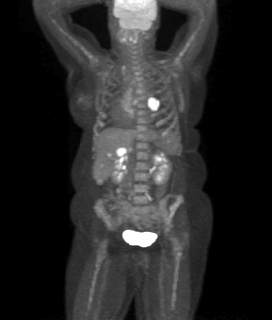
[im 4/48]
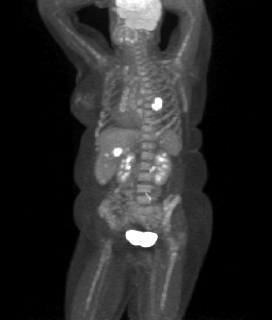
[im 6/48]
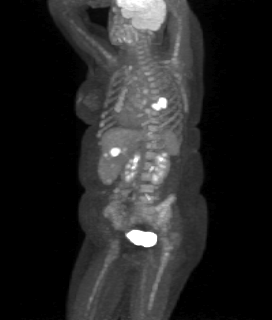
[im 8/48]
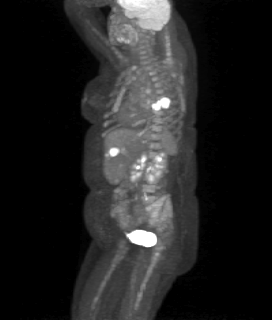
[im 10/48]
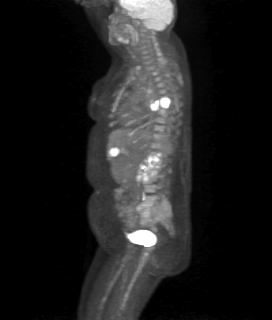
[im 12/48]
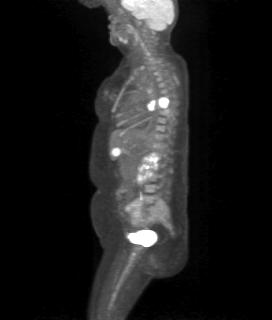
[im 14/48]
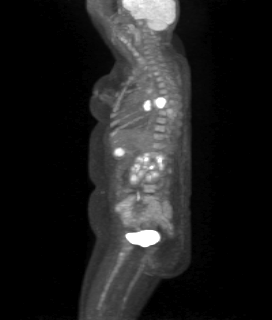
[im 16/48]
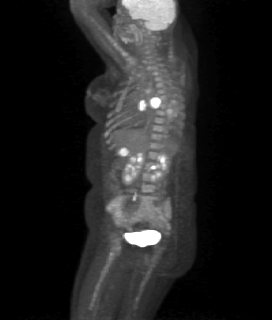
[im 18/48]
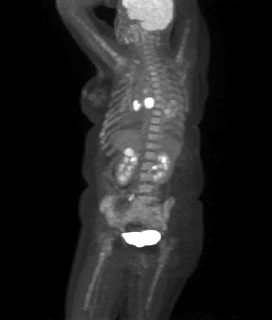
[im 20/48]
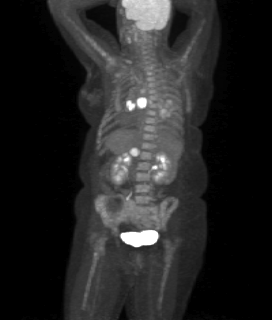
[im 22/48]
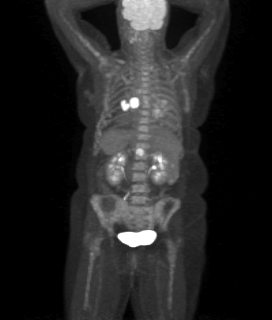
[im 24/48]
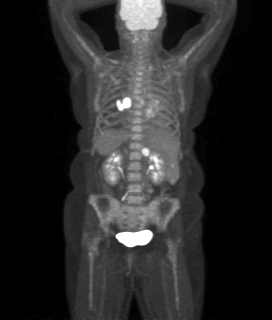
[im 26/48]
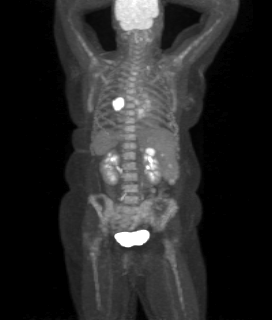
[im 28/48]
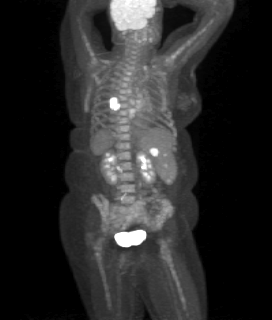
[im 30/48]
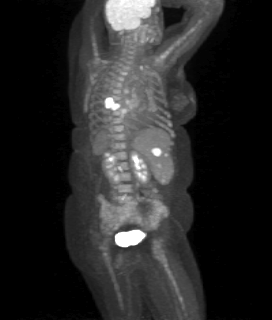
[im 32/48]
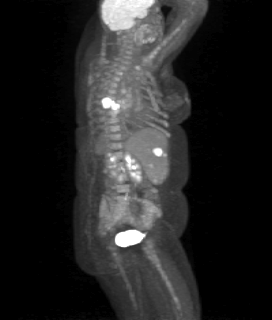
[im 34/48]
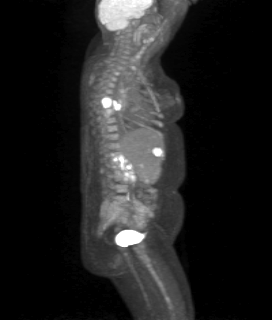
[im 36/48]
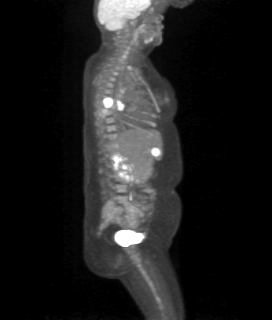
[im 38/48]
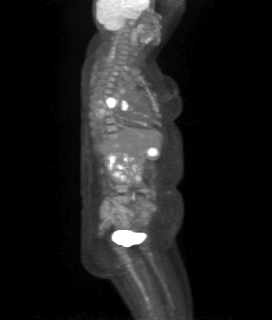
[im 40/48]
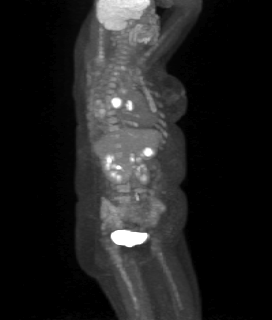
[im 42/48]
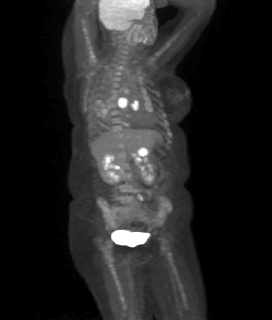
[im 44/48]
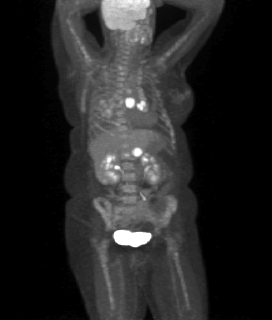
[im 46/48]
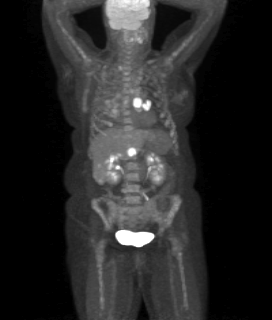
[im 48/48]
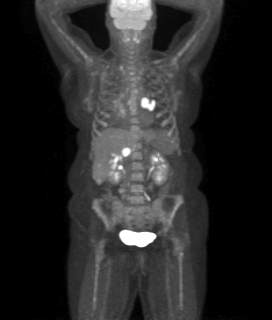

[25 of 25 positions shown; findings below may reference images not displayed]

FINDINGS: HEAD/NECK: 

PET: Physiologic activity is demonstrated within the head and neck.

CT: Arthritic changes of the cervical spine are noted with mild atherosclerotic calcification seen at the carotid bulbs.

THORAX: 

PET:  The background mediastinal SUV is 3.1. Posterior medial right lung mass has maximum SUV of 27.6 and measures 2.3 x 2.6 x 3.3 cm in greatest dimensions. Confluent adenopathy of the left hilum has maximum SUV of 21.6 and measures 2.4 x 3.3 x 1.8 cm. Suspected infection at the posterior lower right lung versus residual scar has maximum SUV of 7.5 and measures 2.4 x 2.0 x 1.0 cm. Nearby patchy air-space disease has maximum SUV of 5.8 and a diameter of 8.5 mm. Low-level adenopathy is likely reactive in the subcarinal region with maximum SUV of 7.1 and short axis diameter of 11 mm. Additional likely reactive adenopathy in the AP window has maximum SUV of 6.3 and short axis diameter of 0.9 cm.  Reactive-appearing lymph node posterior to the right clavicular head has maximum SUV of 6.2 and short axis diameter of 6 mm.

CT: Calcified granulomas are noted in the right lung, right hilum. Patchy air-space disease with calcified granulomas are seen in the medial lower right lung. Small right pleural effusion is evident. There is mild cardiomegaly with physiologic amount pericardial fluid. 

ABDOMEN/PELVIS:

PET:  The background liver maximum SUV is 3.0. Large metastatic focus has intense FDG avidity of 18.9 and is 2.1 cm in craniocaudal extent. Second metastasis to the left liver near the fissure for the round ligament has maximum SUV of 6.6 and a diameter of 11 mm. Two subtle areas of mild increased FDG activity are suspicious for additional metastatic foci involving the inferior right liver lobe. One at the tip has maximum SUV of 5.0 and a posterior more superior lesion in the lower lobe has maximum SUV of 5.1. These have diameters in the range of 6 to 7 mm.

CT: Saccular aneurysm arising from the infrarenal abdominal aorta on the right measures 2.5 x 3.4 cm. Atherosclerotic calcification of the arteries within the abdomen and pelvis prominent. No additional aneurysms are evident. There is no abnormal FDG activity at this site. Uterus and adnexa are surgically absent as is the appendix.

SKELETAL:

PET: Normal FDG activity is noted within the skeleton except for small low-level focal activity involving the proximal left femur at the level of the lesser trochanter with maximum SUV of 4.9 and a diameter of 7 mm. This may be focal metastatic disease to bone or focal red marrow conversion.

CT: Arthritic changes within the spine and pelvis are prominent.
IMPRESSION: 1. Today's PET CT is compared to prior chest CT from 07/27/12 [HOSPITAL]. Intense FDG activity is demonstrated in the primary posterior medial left lower lung lesion, lymphadenopathy at the left hilum and involving metastatic liver lesions, greatest at the left lobe. Additional faint suspected metastases to the lower right liver lobe are noted and there is a subtle focus in the proximal left femur suspicious for metastatic disease versus focal red marrow conversion. These additional areas will bear watching with future surveillance imaging. Findings are consistent with stage IV lung carcinoma with M1b disease.

2. Calcified granulomatous disease involving the right lung as well as right hilar and mediastinal lymph nodes consistent with remote tuberculosis infection. Reactive lymph nodes within the mediastinum and right hilum are also suspected.

3. Infectious posterior right lung activity versus scarring from previous infection. 

4. Saccular aneurysm of the infrarenal abdominal aorta measuring 3.4 cm in greatest dimension. Extensive atherosclerotic arterial vascular disease is noted within the thoracic and abdominal aorta and their branches.

5. Prominent arthritic changes of the spine as described above.

## 2014-11-06 ENCOUNTER — Ambulatory Visit
Admit: 2014-11-06 | Discharge: 2014-11-06 | Payer: PRIVATE HEALTH INSURANCE | Attending: Internal Medicine | Primary: Internal Medicine

## 2014-11-06 DIAGNOSIS — Z0000 Encounter for general adult medical examination without abnormal findings: Secondary | ICD-10-CM

## 2014-11-07 LAB — HCV AB W/RFLX TO NAA
HCV AB, 144035: <0.1 s/co ratio (ref 0.0–0.9)
HCV Ab: <0.1 s/co ratio (ref 0.0–0.9)

## 2014-11-07 LAB — METABOLIC PANEL, COMPREHENSIVE
A-G Ratio: 1.7 (ref 1.1–2.5)
ALT (SGPT): 13 IU/L (ref 0–32)
AST (SGOT): 17 IU/L (ref 0–40)
Albumin: 4.4 g/dL (ref 3.6–4.8)
Alk. phosphatase: 109 IU/L (ref 39–117)
BUN/Creatinine ratio: 19 (ref 11–26)
BUN: 13 mg/dL (ref 8–27)
Bilirubin, total: 0.3 mg/dL (ref 0.0–1.2)
CO2: 22 mmol/L (ref 18–29)
Calcium: 9.3 mg/dL (ref 8.7–10.3)
Chloride: 102 mmol/L (ref 97–108)
Creatinine: 0.67 mg/dL (ref 0.57–1.00)
GFR est AA: 110 mL/min/{1.73_m2} (ref >59)
GFR est non-AA: 95 mL/min/{1.73_m2} (ref >59)
GLOBULIN, TOTAL: 2.6 g/dL (ref 1.5–4.5)
Glucose: 83 mg/dL (ref 65–99)
Potassium: 4.3 mmol/L (ref 3.5–5.2)
Protein, total: 7 g/dL (ref 6.0–8.5)
Sodium: 142 mmol/L (ref 134–144)

## 2014-11-07 LAB — CBC WITH AUTOMATED DIFF
ABS. BASOPHILS: 0 10*3/uL (ref 0.0–0.2)
ABS. EOSINOPHILS: 0.4 10*3/uL (ref 0.0–0.4)
ABS. IMM. GRANS.: 0 10*3/uL (ref 0.0–0.1)
ABS. MONOCYTES: 0.4 10*3/uL (ref 0.1–0.9)
ABS. NEUTROPHILS: 3.8 10*3/uL (ref 1.4–7.0)
Abs Lymphocytes: 2.1 10*3/uL (ref 0.7–3.1)
BASOPHILS: 1 %
EOSINOPHILS: 6 %
HCT: 33.8 % — ABNORMAL LOW (ref 34.0–46.6)
HGB: 11.3 g/dL (ref 11.1–15.9)
IMMATURE GRANULOCYTES: 0 %
Lymphocytes: 31 %
MCH: 27 pg (ref 26.6–33.0)
MCHC: 33.4 g/dL (ref 31.5–35.7)
MCV: 81 fL (ref 79–97)
MONOCYTES: 7 %
NEUTROPHILS: 55 %
PLATELET: 289 10*3/uL (ref 150–379)
RBC: 4.18 x10E6/uL (ref 3.77–5.28)
RDW: 15.8 % — ABNORMAL HIGH (ref 12.3–15.4)
WBC: 6.7 10*3/uL (ref 3.4–10.8)

## 2014-11-07 LAB — HEMOGLOBIN A1C WITH EAG
Estimated average glucose: 126 mg/dL
Hemoglobin A1c: 6 % — ABNORMAL HIGH (ref 4.8–5.6)

## 2014-11-07 LAB — URINALYSIS W/ RFLX MICROSCOPIC
Bilirubin: NEGATIVE
Blood: NEGATIVE
Glucose: NEGATIVE
Ketone: NEGATIVE
Nitrites: NEGATIVE
Protein: NEGATIVE
Specific Gravity: 1.019 (ref 1.005–1.030)
Urobilinogen: 0.2 mg/dL (ref 0.2–1.0)
pH (UA): 6.5 (ref 5.0–7.5)

## 2014-11-07 LAB — LIPID PANEL
Cholesterol, total: 274 mg/dL — ABNORMAL HIGH (ref 100–199)
HDL Cholesterol: 58 mg/dL (ref >39)
LDL, calculated: 157 mg/dL — ABNORMAL HIGH (ref 0–99)
Triglyceride: 297 mg/dL — ABNORMAL HIGH (ref 0–149)
VLDL, calculated: 59 mg/dL — ABNORMAL HIGH (ref 5–40)

## 2014-11-07 LAB — TSH 3RD GENERATION: TSH: 1.94 u[IU]/mL (ref 0.450–4.500)

## 2014-11-07 LAB — MICROSCOPIC EXAMINATION: Casts: NONE SEEN /lpf

## 2014-11-07 LAB — VITAMIN D, 25 HYDROXY: VITAMIN D, 25-HYDROXY: 9.9 ng/mL — ABNORMAL LOW (ref 30.0–100.0)

## 2014-11-07 LAB — CVD REPORT

## 2014-11-07 LAB — INTERPRETATION

## 2014-11-07 NOTE — Progress Notes (Signed)
HISTORY OF PRESENT ILLNESS  Lindsay [name] is a 59 y.o. female.  HPI  Lindsay [name] is seen for well visit with certain recent issues.  She's had a fall.  She bruised her cheek.  She had scalp laceration and sutures at her local emergency room.  She had several CAT scans because there was concern about a small subdural, but apparently this was felt to not be an issue.  She has follow up with neurology, Dr. Criss Rosalesansome, on May 20th.  She has also seen Dr. Donnald GarrePfeiffer from dermatology and had several skin lesions frozen.  She is having trouble with memory and word recall.  She does get anxious and admits to feeling that she has to get all her words out before she will forget them, and her husband reports pressured speech.  She saw Dr. Birder RobsonBright in 2010 for neuro psych testing and was diagnosed with ADD.  She is not on any meds for this in particular.  She is seeing psychiatry, Dr. Salley HewsSpanier, and is on Valium, as well as Lexapro.  She also admits to having two beers a night and I am worried that the memory recall may be related to the alcohol with the benzodiazepines.    Preventive Care:  1. Colonoscopy done in 2009.  2. Regular derm checks.  3. Regular GYN checks.      Review of Systems   Constitutional: Positive for malaise/fatigue. Negative for fever, chills, weight loss and diaphoresis.   HENT: Positive for congestion. Negative for hearing loss and sore throat.    Eyes: Negative for double vision and pain.   Respiratory: Negative for cough, shortness of breath and wheezing.    Cardiovascular: Negative for chest pain, palpitations, orthopnea, leg swelling and PND.   Gastrointestinal: Negative for heartburn, nausea, vomiting, abdominal pain and blood in stool.   Genitourinary: Negative for dysuria, urgency and frequency.   Musculoskeletal: Positive for back pain and falls. Negative for myalgias.   Skin: Negative for rash.   Neurological: Positive for sensory change. Negative for dizziness,  tingling, seizures, loss of consciousness and headaches. Speech change: scalp.   Psychiatric/Behavioral: Positive for memory loss. Negative for depression, suicidal ideas, hallucinations and substance abuse. The patient is nervous/anxious.        Physical Exam   Constitutional: She is oriented to person, place, and time. She appears well-developed and well-nourished.   HENT:   Head: Normocephalic and atraumatic.   Right Ear: External ear normal.   Left Ear: External ear normal.   Nose: Nose normal.   Mouth/Throat: Oropharynx is clear and moist.   Eyes: Conjunctivae and EOM are normal. Pupils are equal, round, and reactive to light.   Neck: Normal range of motion. Neck supple. Carotid bruit is not present. No thyromegaly present.   Cardiovascular: Normal rate, regular rhythm, S1 normal, S2 normal, normal heart sounds and intact distal pulses.    Pulmonary/Chest: Effort normal and breath sounds normal.   Abdominal: Soft. Normal appearance and bowel sounds are normal. She exhibits no mass. There is no hepatosplenomegaly. There is no tenderness.   Genitourinary:   bilat breast implants no masses   Musculoskeletal: Normal range of motion. She exhibits no edema.   Lymphadenopathy:     She has no cervical adenopathy.   Neurological: She is alert and oriented to person, place, and time.   Skin: Skin is warm, dry and intact. No rash noted.   Psychiatric: She has a normal mood and affect. Her behavior is normal. Judgment and thought  content normal.   Nursing note and vitals reviewed.      ASSESSMENT and PLAN  Lindsay [name] was seen today for complete physical.    Diagnoses and all orders for this visit:    Well woman exam (no gynecological exam)  Orders:  -     CBC WITH AUTOMATED DIFF  -     URINALYSIS W/ RFLX MICROSCOPIC  -     METABOLIC PANEL, COMPREHENSIVE  -     LIPID PANEL  -     TSH 3RD GENERATION  -     HCV AB W/RFLX TO NAA  -     VITAMIN D, 25 HYDROXY  -     HEMOGLOBIN A1C WITH EAG  -     MAM MAMMO BI SCREENING DIGTL; Future     Dyslipidemia, goal LDL below 100    Anxiety    Cognitive impairment-discussed my concern that the valium and the beer at night  Might be having cognitive impact as well as inc risk of falls  Orders:  -     REFERRAL TO NEUROLOGY    Fall at home, initial encounter    Other orders  -     MICROSCOPIC EXAMINATION  -     INTERPRETATION  -     CVD REPORT

## 2014-11-08 ENCOUNTER — Encounter

## 2014-11-08 MED ORDER — ERGOCALCIFEROL (VITAMIN D2) 50,000 UNIT CAP
1250 mcg (50,000 unit) | ORAL_CAPSULE | ORAL | Status: DC
Start: 2014-11-08 — End: 2020-02-08

## 2014-11-08 NOTE — Progress Notes (Signed)
Quick Note:        Notified by letter.    And left a message to start vit d weekly and diet and appt in 65mo    ______

## 2015-02-10 ENCOUNTER — Encounter: Attending: Internal Medicine | Primary: Internal Medicine

## 2015-03-24 ENCOUNTER — Ambulatory Visit
Admit: 2015-03-24 | Discharge: 2015-03-24 | Payer: PRIVATE HEALTH INSURANCE | Attending: Neurology | Primary: Internal Medicine

## 2015-03-24 ENCOUNTER — Ambulatory Visit: Attending: Neurology | Primary: Internal Medicine

## 2015-03-24 DIAGNOSIS — G2581 Restless legs syndrome: Secondary | ICD-10-CM

## 2015-03-24 MED ORDER — ROPINIROLE 0.25 MG TAB
0.25 mg | ORAL_TABLET | ORAL | 3 refills | Status: DC
Start: 2015-03-24 — End: 2015-06-23

## 2015-03-24 NOTE — Patient Instructions (Signed)
A Healthy Lifestyle: Care Instructions  Your Care Instructions  A healthy lifestyle can help you feel good, stay at a healthy weight, and have plenty of energy for both work and play. A healthy lifestyle is something you can share with your whole family.  A healthy lifestyle also can lower your risk for serious health problems, such as high blood pressure, heart disease, and diabetes.  You can follow a few steps listed below to improve your health and the health of your family.  Follow-up care is a key part of your treatment and safety. Be sure to make and go to all appointments, and call your doctor if you are having problems. It???s also a good idea to know your test results and keep a list of the medicines you take.  How can you care for yourself at home?  ?? Do not eat too much sugar, fat, or fast foods. You can still have dessert and treats now and then. The goal is moderation.  ?? Start small to improve your eating habits. Pay attention to portion sizes, drink less juice and soda pop, and eat more fruits and vegetables.  ?? Eat a healthy amount of food. A 3-ounce serving of meat, for example, is about the size of a deck of cards. Fill the rest of your plate with vegetables and whole grains.  ?? Limit the amount of soda and sports drinks you have every day. Drink more water when you are thirsty.  ?? Eat at least 5 servings of fruits and vegetables every day. It may seem like a lot, but it is not hard to reach this goal. A serving or helping is 1 piece of fruit, 1 cup of vegetables, or 2 cups of leafy, raw vegetables. Have an apple or some carrot sticks as an afternoon snack instead of a candy bar. Try to have fruits and/or vegetables at every meal.  ?? Make exercise part of your daily routine. You may want to start with simple activities, such as walking, bicycling, or slow swimming. Try to be active 30 to 60 minutes every day. You do not need to do all 30 to 60  minutes all at once. For example, you can exercise 3 times a day for 10 or 20 minutes. Moderate exercise is safe for most people, but it is always a good idea to talk to your doctor before starting an exercise program.  ?? Keep moving. Mow the lawn, work in the garden, or clean your house. Take the stairs instead of the elevator at work.  ?? If you smoke, quit. People who smoke have an increased risk for heart attack, stroke, cancer, and other lung illnesses. Quitting is hard, but there are ways to boost your chance of quitting tobacco for good.  ?? Use nicotine gum, patches, or lozenges.  ?? Ask your doctor about stop-smoking programs and medicines.  ?? Keep trying.  In addition to reducing your risk of diseases in the future, you will notice some benefits soon after you stop using tobacco. If you have shortness of breath or asthma symptoms, they will likely get better within a few weeks after you quit.  ?? Limit how much alcohol you drink. Moderate amounts of alcohol (up to 2 drinks a day for men, 1 drink a day for women) are okay. But drinking too much can lead to liver problems, high blood pressure, and other health problems.  Family health  If you have a family, there are many things you can do   together to improve your health.  ?? Eat meals together as a family as often as possible.  ?? Eat healthy foods. This includes fruits, vegetables, lean meats and dairy, and whole grains.  ?? Include your family in your fitness plan. Most people think of activities such as jogging or tennis as the way to fitness, but there are many ways you and your family can be more active. Anything that makes you breathe hard and gets your heart pumping is exercise. Here are some tips:  ?? Walk to do errands or to take your child to school or the bus.  ?? Go for a family bike ride after dinner instead of watching TV.  Where can you learn more?  Go to http://www.healthwise.net/GoodHelp[employer]ctions   Enter U807 in the search box to learn more about "A Healthy Lifestyle: Care Instructions."  ?? 2006-2016 Health[employer]. Care instructions adapted under license by [employer] (which disclaims liability or warranty for this information). This care instruction is for use with your licensed healthcare professional. If you have questions about a medical condition or this instruction, always ask your healthcare professional. Healthwise, Incorporated disclaims any warranty or liability for your use of this information.  Content Version: 10.9.538570; Current as of: May 24, 2014

## 2015-03-24 NOTE — Progress Notes (Signed)
Neurology Progress Note    HISTORY PROVIDED BY: patient    Chief Complaint:   Chief Complaint   Patient presents with   ??? Follow-up     "possible MS"    ??? Fall     last fall October 15, 2014      Subjective:    Lindsay [name] is a 59 y.o. right handed female last seen in clinic at [facility] 11/22/14 in f/u for radiographic changes c/w MS, but no clinical symptoms. She had new c/o two falls in April one with question of SDH, not seen on MRI brain, and shoulder injury with the other, had falls in past associated with injury to shoulder and wrist. To review her history, she had onset of right facial and eye pain in November, 2010 and one week h/o right LE weakness with past history of right UE tingling and tightness. MRI brain findings were highly suspicious for demyelinating disease, however, an MS specialist at [facility] did not feel that she has history c/w MS and that her MRI findings were not diagnostic. Repeat MRI brain w/wo contrast 11/25/10 at [facility] revealed multiple white matter lesions without enhancement, which could be c/w MS, stable since 12/03/09.  Last MRI brain wo contrast 10/22/14 appeared stable as well. Exam was stable with mild right ptosis, o/w unremarkable. No neurological etiology for falls found. Perhaps related to use of Valium 34m bid. [facility]     She returns for f/u due to new issues. She reports improvement in her gait, no falls. She is very cautious on stairs especially if she is carrying something. She has had knee issues and is being evaluated in ortho. She has new complaint of inability to hold her legs still. This starts around 6PM, it is preventing her from going to dinner with her family, keeps her awake. She feels like she has to move her legs or the feeling in her legs will drive her crazy. Her brother, sister, and mother have RLS.  She feels her personality has changed, she has let herself go,  her house go, her office go, over the last year which is not like her. She does note that the last year has been very stressful due to family issues and stress of owning two businesses.  She has noticed trouble with her memory, people swear that they have told her things and she has no memory of it. She has no interest in working, feels fatigued when she gets home from work and does not want to clean the house. She is sleeping up to 12 hours, she used to stay up all night.  She reports recent panic attack, given hydroxyzine at [facility].  She has a psychiatrist that she sees every 8 weeks for depression and anxiety, due in next week. She has a diagnosis of ADHD, but is reluctant to take medications due to possible reaction to last trial several years ago.  She has seen her PCP and had a complete blood work up, found to have low iron and was started on replacement therapy.  Her cholesterol level was borderline high.  Sleeps well, talks in her sleep, no snoring or waking up gasping for air.     Past Medical History   Diagnosis Date   ??? Depression    ??? Depression    ??? Headache(784.0) 2011   ??? Other and unspecified hyperlipidemia 12/27/2012      Past Surgical History   Procedure  Laterality Date   ??? Hx other surgical  2010     left foot torn tendon   ??? Hx cesarean section     ??? Hx appendectomy     ??? Hx orthopaedic       rotater cuff r. shoulder   ??? Hx orthopaedic       left ankle fracture treated with air cast   ??? Hx orthopaedic  6/13     left shoulder fxt and dislocation      Social History     Social History   ??? Marital status: MARRIED     Spouse name: N/A   ??? Number of children: N/A   ??? Years of education: N/A     Occupational History   ??? Not on file.     Social History Main Topics   ??? Smoking status: Never Smoker   ??? Smokeless tobacco: Never Used   ??? Alcohol use 4.2 oz/week     7 Cans of beer per week   ??? Drug use: No   ??? Sexual activity: Yes     Partners: Male     Other Topics Concern   ??? Not on file      Social History Narrative     Family History   Problem Relation Age of Onset   ??? Heart Disease Mother    ??? Diabetes Mother    ??? Heart Disease Father    ??? Cancer Father      thyroid cancer          Objective:   ROS:  Per HPI-  Otherwise 10 point ROS was negative    No Known Allergies    Meds:  Outpatient Medications Prior to Visit   Medication Sig Dispense Refill   ??? escitalopram oxalate (LEXAPRO) 20 mg tablet Take 1 Tab by mouth daily. 30 Tab 5   ??? diazepam (VALIUM) 10 mg tablet Take 1 tablet by mouth daily for anxiety (Patient taking differently: Take 15 mg tablet by mouth daily for anxiety) 30 Tab 0   ??? omeprazole (PRILOSEC) 20 mg capsule Take 20 mg by mouth daily.       ??? ergocalciferol (ERGOCALCIFEROL) 50,000 unit capsule Take 1 Cap by mouth every seven (7) days. 12 Cap 1   ??? ibuprofen (MOTRIN) 200 mg tablet Take  by mouth.       No facility-administered medications prior to visit.        Imaging:  MRI Results (most recent):    Results from Hospital Encounter encounter on 03/31/12   MRI SHOULDER LT WO CONT   Narrative **Final Report**      ICD Codes / Adm.Diagnosis: 727.61  786.50 / Complete rupture of rotator cu    Examination:  MR SHOULDER WO CON LT  - [id number] - Mar 31 2012  3:40PM  Accession No:  [id number]  Reason:  pain      REPORT:  EXAM:  MR SHOULDER WO CON LT    INDICATION: Fell on June 18 with left shoulder pain.    COMPARISON: None    TECHNIQUE: Axial proton density fat-saturated; oblique coronal T1, T2   fat-saturated, and proton density fat-saturated; and oblique sagittal T2   fat-saturated MRI of the left shoulder.    CONTRAST: None.    FINDINGS: A.C. joint: Normal for age. Anterior acromion process type: 2.   There is a small subacromial spur.    Bone marrow: There is a transverse fracture of the surgical neck of the  humerus with surrounding bone marrow edema the structures toward the greater   tuberosity. There is minimal anterior angulation at the fracture site. There    is no continuation to the articular surface of the humeral head.    Joint fluid: No joint effusion.    Rotator cuff tendons: There is high-grade partial-thickness tearing of the   anterior aspect of the supraspinatus tendon on series 6 image 13. Only   several fibers remain intact and there is no significant retraction. There   is a small partial-thickness articular sided tear in the insertion of the   infraspinatus measuring 7 mm in the AP direction on series 7 image 15 x 5 mm   transverse on series 5 image 7. Mild tendinosis is present in the cranial   fibers of the subscapularis. The remainder of the cuff is intact.    Biceps tendon: Intact and located within the bicipital groove.    Muscles: Mild edema is present along the inferior aspect of the muscle belly   of the supraspinatus on series 7 image 1.    Glenoid labrum: Diffuse tearing is present throughout the labrum, which may   be a least partially degenerative in nature.    Joint capsule: The middle glenohumeral ligament is mildly enlarged with   increased signal on series 3 image 9 may related to partial tearing.   Synovitis is seen both in the rotator interval and in the axillary pouch.    Glenohumeral articular cartilage: Intact. No focal osteochondral lesion.    Soft tissue mass: None.       IMPRESSION:  1. Subacute transverse fracture of the surgical neck of the humerus with   mild anterior angulation.  2. High-grade partial tearing of the anterior supraspinatus tendon. Mild   edema seen along the inferior aspect of the supraspinatus more medially.  3. Partial-thickness articular sided insertional tear of the infraspinatus   tendon.  4. Diffuse degenerative changes throughout the labrum.  5. Enlargement and high signal within the middle glenohumeral ligament   likely related to partial tearing.  6. Synovitis in the rotator interval and axillary pouch.           Signing/Reading Doctor: Bernardo Heater ([id number])     Approved: Bernardo Heater ([id number])  Apr 03 2012  8:08AM                                     CT Results (most recent):    Results from [facility] encounter on 02/05/14   CTA CHEST W WO CONT   Narrative **Final Report**      ICD Codes / Adm.Diagnosis: 811914  786.59 / Leg Pain  Leg pain  Examination:  CT CHEST ANGIOGRAPHY  - [id number] - Feb 05 2014  4:13AM  Accession No:  [id number]  Reason:  Chest pain R/O PE      REPORT:  Clinical indication: Chest pain.    Localizer obtained without contrast at the level of the pulmonary arteries.   Fast injection rate of 80 cc of Isovue-370 with review of the raw data and   MIP reconstructions.  There is no pulmonary emboli. There is no pericardial or pleural effusion.   No shift or pneumothorax. No masses or adenopathy.       IMPRESSION: No acute changes.           Signing/Reading Doctor: Lannette Donath [phone])  ApprovedLannette Donath ([id number])  Feb 05 2014  4:45AM                                      Reviewed [facility] in [facility] and media tab today    Lab Review   Results for orders placed or performed in visit on 11/06/14   CBC WITH AUTOMATED DIFF   Result Value Ref Range    WBC 6.7 3.4 - 10.8 x10E3/uL    RBC 4.18 3.77 - 5.28 x10E6/uL    HGB 11.3 11.1 - 15.9 g/dL    HCT 33.8 (L) 34.0 - 46.6 %    MCV 81 79 - 97 fL    MCH 27.0 26.6 - 33.0 pg    MCHC 33.4 31.5 - 35.7 g/dL    RDW 15.8 (H) 12.3 - 15.4 %    PLATELET 289 150 - 379 x10E3/uL    NEUTROPHILS 55 %    Lymphocytes 31 %    MONOCYTES 7 %    EOSINOPHILS 6 %    BASOPHILS 1 %    ABS. NEUTROPHILS 3.8 1.4 - 7.0 x10E3/uL    Abs Lymphocytes 2.1 0.7 - 3.1 x10E3/uL    ABS. MONOCYTES 0.4 0.1 - 0.9 x10E3/uL    ABS. EOSINOPHILS 0.4 0.0 - 0.4 x10E3/uL    ABS. BASOPHILS 0.0 0.0 - 0.2 x10E3/uL    IMMATURE GRANULOCYTES 0 %    ABS. IMM. GRANS. 0.0 0.0 - 0.1 x10E3/uL   URINALYSIS W/ RFLX MICROSCOPIC   Result Value Ref Range    Specific Gravity 1.019 1.005 - 1.030    pH (UA) 6.5 5.0 - 7.5    Color Yellow Yellow     Appearance Clear Clear    Leukocyte Esterase 1+ (A) Negative    Protein Negative Negative/Trace    Glucose Negative Negative    Ketone Negative Negative    Blood Negative Negative    Bilirubin Negative Negative    Urobilinogen 0.2 0.2 - 1.0 mg/dL    Nitrites Negative Negative    Microscopic Examination See additional order    METABOLIC PANEL, COMPREHENSIVE   Result Value Ref Range    Glucose 83 65 - 99 mg/dL    BUN 13 8 - 27 mg/dL    Creatinine 0.67 0.57 - 1.00 mg/dL    GFR est non-AA 95 >59 mL/min/1.73    GFR est AA 110 >59 mL/min/1.73    BUN/Creatinine ratio 19 11 - 26    Sodium 142 134 - 144 mmol/L    Potassium 4.3 3.5 - 5.2 mmol/L    Chloride 102 97 - 108 mmol/L    CO2 22 18 - 29 mmol/L    Calcium 9.3 8.7 - 10.3 mg/dL    Protein, total 7.0 6.0 - 8.5 g/dL    Albumin 4.4 3.6 - 4.8 g/dL    GLOBULIN, TOTAL 2.6 1.5 - 4.5 g/dL    A-G Ratio 1.7 1.1 - 2.5    Bilirubin, total 0.3 0.0 - 1.2 mg/dL    Alk. phosphatase 109 39 - 117 IU/L    AST 17 0 - 40 IU/L    ALT 13 0 - 32 IU/L   LIPID PANEL   Result Value Ref Range    Cholesterol, total 274 (H) 100 - 199 mg/dL    Triglyceride 297 (H) 0 - 149 mg/dL    HDL Cholesterol 58 >39 mg/dL  VLDL, calculated 59 (H) 5 - 40 mg/dL    LDL, calculated 157 (H) 0 - 99 mg/dL   TSH 3RD GENERATION   Result Value Ref Range    TSH 1.940 0.450 - 4.500 uIU/mL   HCV AB W/RFLX TO NAA   Result Value Ref Range    HCV Ab <0.1 0.0 - 0.9 s/co ratio   VITAMIN D, 25 HYDROXY   Result Value Ref Range    VITAMIN D, 25-HYDROXY 9.9 (L) 30.0 - 100.0 ng/mL   HEMOGLOBIN A1C WITH EAG   Result Value Ref Range    Hemoglobin A1c 6.0 (H) 4.8 - 5.6 %    Estimated average glucose 126 mg/dL   MICROSCOPIC EXAMINATION   Result Value Ref Range    WBC 6-10 (A) 0 - 5 /hpf    RBC 0-2 0 - 2 /hpf    Epithelial cells 0-10 0 - 10 /hpf    Casts None seen None seen /lpf    Mucus Present Not Estab.    Bacteria Few None seen/Few   INTERPRETATION   Result Value Ref Range    HCV Interpretation Comment    CVD REPORT    Result Value Ref Range    INTERPRETATION Note         Exam:  Visit Vitals   ??? BP 122/82 (BP 1 Location: Right arm, BP Patient Position: Sitting)   ??? Pulse 85   ??? Ht '5\' 3"'  (1.6 m)   ??? Wt 76.7 kg (169 lb)   ??? SpO2 97%   ??? BMI 29.94 kg/m2     General:  Alert, cooperative, no distress.   Head:  Normocephalic, without obvious abnormality, atraumatic.   Respiratory:  Heart:   Non labored breathing  Regular rate and rhythm, no murmurs   Neck:   2+ carotids, no bruits   Extremities: Warm, no cyanosis or edema.   Pulses: 2+ radial pulses.       Neurologic:  MS: Alert and oriented x 4, speech intact. Language - see MMSE. Attention and fund of knowledge appropriate.  Recent and remote memory intact.  Cranial Nerves:  II: visual fields Full to confrontation   II: pupils Equal, round, reactive to light   II: optic disc No papilledema   III,VII: ptosis Mild right ptosis - stable   III,IV,VI: extraocular muscles  EOMI, no nystagmus or diplopia   V: facial light touch sensation  normal   VII: facial muscle function   symmetric   VIII: hearing intact   IX: soft palate elevation  normal   XI: trapezius strength  5/5   XI: sternocleidomastoid strength 5/5   XII: tongue  Midline     Motor: normal bulk and tone, no tremor              Strength: 5/5 throughout, no PD  Sensory: intact to LT, PP  Coordination: FTN and HTS intact, RAM intact,Romberg negative  Gait: normal gait, able to heel, toe, and tandem walk  Reflexes: 2+ symmetric, toes downgoing    Mini Mental State Exam 03/24/2015   What is the Year 1   What is the Season 1   What is the Date 1   What is the Day 1   What is the Month 1   Where are we State 1   Where are we Country 1   Where are we [place] or [place] 1   [place] are we Floor 1   Name three objects, then ask the patient  to say them 3   Serial sevens Subtract 7 from 100 in increments 5   Ask for the three objects repeated above 3   Name a pencil 1   Name a watch 1   Have the patient repeat this phrase "No ifs, ands, or buts" 1    Three stage command: Take the paper in your right hand 1   Fold the paper in half 1   Put the paper on the floor 1   Read and obey the following: [place] 1   Have the patient write a sentence 1   Have the patient copy a figure 1   Mini Mental Score 30              Assessment/Plan   Pt is a 59 y.o. right handed female previously followed for radiographic changes c/w MS, but no clinical symptoms, frequent falls with injuries without obvious neurological etiology, but could be medication effects with use of Valium 82m bid, [facility]  Now with two new complaints, an ill-defined irritating sensation in her legs with urge to move her legs beginning around 6PM and keeping her awake, and concerns about personality changes describing lack of motivation, not keeping up with house work/office work/or personal care, memory loss, fatigue, and new panic attacks.  Exam is unchanged with mild right ptosis, MMSE score is 30/30. Suspect "personality changes" are related to worsening depression/anxiety and/or stress, with possible worsening of ADHD due to stressors. Low suspicion for MS as an etiology without other physical signs of exacerbation. Dementia is a consideration, much lower on differential.  Recommend evaluation with her psychiatrist. Will reassess in 3 months, if no improvement, then consider neuropsych testing for pseudodementia vs dementia.  Urge to move legs when resting in the evening, keeping her awake at night is suggestive of RLS, does have strong family h/o this, and has iron deficiency.  Will start trial of Requip 0.258m2 hours before onset of symptoms, so around 4PM, titrating to 67m65mvery day, side effects reviewed. F/u in 3 months, instructed to call in the interim with any questions or concerns.       ICD-10-CM ICD-9-CM    1. RLS (restless legs syndrome) G25.81 333.94    2. Abnormal brain scan R94.02 794.09    3. Memory loss R41.3 780.93        Signed:   MarChauncey ReadingD  03/24/2015

## 2015-05-01 NOTE — Telephone Encounter (Signed)
Patient would like an e-mail through my chart or a call back regarding Dr. Verlee MonteGlynn's recommendation for a Hemorid doctors.

## 2015-05-01 NOTE — Telephone Encounter (Signed)
Returned patient's call. Provided patient information to colon rectal specialists for eval of her hemorrhoids. Patient voiced understanding.

## 2015-06-23 ENCOUNTER — Ambulatory Visit
Admit: 2015-06-23 | Discharge: 2015-06-23 | Payer: PRIVATE HEALTH INSURANCE | Attending: Neurology | Primary: Internal Medicine

## 2015-06-23 DIAGNOSIS — G2581 Restless legs syndrome: Secondary | ICD-10-CM

## 2015-06-23 MED ORDER — ROPINIROLE 1 MG TAB
1 mg | ORAL_TABLET | ORAL | 5 refills | Status: DC
Start: 2015-06-23 — End: 2015-10-27

## 2015-06-23 NOTE — Patient Instructions (Signed)
PRESCRIPTION REFILL POLICY    [facility]   Statement to Patients  October 03, 2012      In an effort to ensure the large volume of patient prescription refills is processed in the most efficient and expeditious manner, we are asking our patients to assist us by calling your Pharmacy for all prescription refills, this will include also your  Mail Order Pharmacy. The pharmacy will contact our office electronically to continue the refill process.    Please do not wait until the last minute to call your pharmacy. We need at least 48 hours (2days) to fill prescriptions. We also encourage you to call your pharmacy before going to pick up your prescription to make sure it is ready.     With regard to controlled substance prescription refill requests (narcotic refills) that need to be picked up at our office, we ask your cooperation by providing us with at least 72 hours (3days) notice that you will need a refill.    We will not refill narcotic prescription refill requests after 4:00pm on any weekday, Monday through Thursday, or after 2:00pm on Fridays, or on the weekends.      We encourage everyone to explore another way of getting your prescription refill request processed using MyChart, our patient web portal through our electronic medical record system. MyChart is an efficient and effective way to communicate your medication request directly to the office and  downloadable as an app on your smart phone . MyChart also features a review functionality that allows you to view your medication list as well as leave messages for your physician. Are you ready to get connected? If so please review the attatched instructions or speak to any of our staff to get you set up right away!    Thank you so much for your cooperation. Should you have any questions please contact our Practice Administrator.    The Physicians and Staff,  [facility]

## 2015-06-23 NOTE — Progress Notes (Signed)
Patient here to follow up on restless leg syndrome.

## 2015-06-23 NOTE — Progress Notes (Signed)
Neurology Progress Note    HISTORY PROVIDED BY: patient    Chief Complaint:   Chief Complaint   Patient presents with   ??? Leg Problem     follow up for restless leg syndrome      Subjective:   Pt is a 59 y.o. right handed female last seen in clinic on 03/24/15 in f/u for radiographic changes c/w MS, but no clinical symptoms, frequent falls with injuries without obvious neurological etiology, but could be medication effects with use of Valium 27m bid, [facility]  Had two new complaints, an ill-defined irritating sensation in her legs with urge to move her legs beginning around 6PM and keeping her awake, and concerns about personality changes describing lack of motivation, not keeping up with house work/office work/or personal care, memory loss, fatigue, and new panic attacks.  Exam was unchanged with mild right ptosis, MMSE score was 30/30. Last MRI brain wo contrast 10/22/14 appeared stable. Suspected "personality changes" were related to worsening depression/anxiety and/or stress, with possible worsening of ADHD due to stressors. Low suspicion for MS as an etiology without other physical signs of exacerbation. Dementia was a consideration, much lower on differential.  Recommended evaluation with her psychiatrist, if no improvement at 3 month f/u, then consider neuropsych testing for pseudodementia vs dementia.  Urge to move legs when resting in the evening, keeping her awake at night suggestive of RLS, does have strong family h/o this, and has iron deficiency.  Started trial of Requip 0.224m2 hours before onset of symptoms, so around 4PM, titrating to 24m99mvery day.   She returns for planned f/u. She was seen by her psychiatrist and he wanted her to take Wellbutrin, but she hasn't due to concerns about worsening of her RLS.  She had improvement in RLS after starting Requip at 0.68m53mut then had worsening, now up to 24mg 26m.  She typically goes  right to sleep now, though occasionally will keep her awake for an hour or two.  She has seen her orthopedist and had steroid injections of knees, but has not helped her pain.  She is not certain how much of her LE sxs are related to knee arthritis vs RLS. States that she cannot sit still during the day because her knees hurt and she feels like she needs to move them, states she feels like she is "going crazy" if she has to sit for another minute. If she is up walking around she does not think about the pain in her knees though it is still there. No significant change in cognitive function.  Still having word finding. Tells me that she "talks all the time" and "interupts everyone" in order to get her thoughts out before she forgets what she wanted to say.  Ongoing stressors with new stressors since last visit.      Past Medical History   Diagnosis Date   ??? Abnormal brain scan    ??? Depression    ??? Falls frequently    ??? Fracture of left ankle    ??? Headache(784.0) 2011   ??? Other and unspecified hyperlipidemia 12/27/2012   ??? PE (pulmonary embolism)    ??? Torn rotator cuff      right      Past Surgical History   Procedure Laterality Date   ??? Hx other surgical  2010     left foot torn tendon   ??? Hx cesarean section       x4   ???  Hx appendectomy     ??? Hx orthopaedic       rotater cuff r. shoulder   ??? Hx orthopaedic       left ankle fracture treated with air cast   ??? Hx orthopaedic  6/13     left shoulder fxt and dislocation      Social History     Social History   ??? Marital status: MARRIED     Spouse name: N/A   ??? Number of children: N/A   ??? Years of education: N/A     Occupational History   ??? Not on file.     Social History Main Topics   ??? Smoking status: Never Smoker   ??? Smokeless tobacco: Never Used   ??? Alcohol use 4.2 oz/week     7 Cans of beer per week   ??? Drug use: No   ??? Sexual activity: Yes     Partners: Male     Other Topics Concern   ??? Not on file     Social History Narrative     Family History    Problem Relation Age of Onset   ??? Heart Disease Mother    ??? Diabetes Mother    ??? Heart Disease Father    ??? Cancer Father      thyroid cancer          Objective:   ROS:  Per HPI-  Otherwise 10 point ROS was negative    No Known Allergies    Meds:  Outpatient Medications Prior to Visit   Medication Sig Dispense Refill   ??? rOPINIRole (REQUIP) 0.25 mg tablet 1 tab once daily 1-3 hours before bedtime, increase after 2 days to 2 tabs daily, and after 7 days to 4 tabs daily. 120 Tab 3   ??? ibuprofen (MOTRIN) 200 mg tablet Take  by mouth.     ??? escitalopram oxalate (LEXAPRO) 20 mg tablet Take 1 Tab by mouth daily. 30 Tab 5   ??? diazepam (VALIUM) 10 mg tablet Take 1 tablet by mouth daily for anxiety (Patient taking differently: Take 15 mg tablet by mouth daily for anxiety) 30 Tab 0   ??? omeprazole (PRILOSEC) 20 mg capsule Take 20 mg by mouth daily.       ??? ergocalciferol (ERGOCALCIFEROL) 50,000 unit capsule Take 1 Cap by mouth every seven (7) days. 12 Cap 1     No facility-administered medications prior to visit.        Imaging:  MRI Results (most recent):    Results from Hospital Encounter encounter on 03/31/12   MRI SHOULDER LT WO CONT   Narrative **Final Report**      ICD Codes / Adm.Diagnosis: 727.61  786.50 / Complete rupture of rotator cu    Examination:  MR SHOULDER WO CON LT  - [id number] - Mar 31 2012  3:40PM  Accession No:  [id number]  Reason:  pain      REPORT:  EXAM:  MR SHOULDER WO CON LT    INDICATION: Fell on June 18 with left shoulder pain.    COMPARISON: None    TECHNIQUE: Axial proton density fat-saturated; oblique coronal T1, T2   fat-saturated, and proton density fat-saturated; and oblique sagittal T2   fat-saturated MRI of the left shoulder.    CONTRAST: None.    FINDINGS: A.C. joint: Normal for age. Anterior acromion process type: 2.   There is a small subacromial spur.    Bone marrow: There is a transverse fracture of  the surgical neck of the    humerus with surrounding bone marrow edema the structures toward the greater   tuberosity. There is minimal anterior angulation at the fracture site. There   is no continuation to the articular surface of the humeral head.    Joint fluid: No joint effusion.    Rotator cuff tendons: There is high-grade partial-thickness tearing of the   anterior aspect of the supraspinatus tendon on series 6 image 13. Only   several fibers remain intact and there is no significant retraction. There   is a small partial-thickness articular sided tear in the insertion of the   infraspinatus measuring 7 mm in the AP direction on series 7 image 15 x 5 mm   transverse on series 5 image 7. Mild tendinosis is present in the cranial   fibers of the subscapularis. The remainder of the cuff is intact.    Biceps tendon: Intact and located within the bicipital groove.    Muscles: Mild edema is present along the inferior aspect of the muscle belly   of the supraspinatus on series 7 image 1.    Glenoid labrum: Diffuse tearing is present throughout the labrum, which may   be a least partially degenerative in nature.    Joint capsule: The middle glenohumeral ligament is mildly enlarged with   increased signal on series 3 image 9 may related to partial tearing.   Synovitis is seen both in the rotator interval and in the axillary pouch.    Glenohumeral articular cartilage: Intact. No focal osteochondral lesion.    Soft tissue mass: None.       IMPRESSION:  1. Subacute transverse fracture of the surgical neck of the humerus with   mild anterior angulation.  2. High-grade partial tearing of the anterior supraspinatus tendon. Mild   edema seen along the inferior aspect of the supraspinatus more medially.  3. Partial-thickness articular sided insertional tear of the infraspinatus   tendon.  4. Diffuse degenerative changes throughout the labrum.  5. Enlargement and high signal within the middle glenohumeral ligament   likely related to partial tearing.   6. Synovitis in the rotator interval and axillary pouch.           Signing/Reading Doctor: Bernardo Heater ([id number])    Approved: Bernardo Heater ([id number])  Apr 03 2012  8:08AM                                     CT Results (most recent):    Results from [facility] encounter on 02/05/14   CTA CHEST W WO CONT   Narrative **Final Report**      ICD Codes / Adm.Diagnosis: 338250  786.59 / Leg Pain  Leg pain  Examination:  CT CHEST ANGIOGRAPHY  - [id number] - Feb 05 2014  4:13AM  Accession No:  [id number]  Reason:  Chest pain R/O PE      REPORT:  Clinical indication: Chest pain.    Localizer obtained without contrast at the level of the pulmonary arteries.   Fast injection rate of 80 cc of Isovue-370 with review of the raw data and   MIP reconstructions.  There is no pulmonary emboli. There is no pericardial or pleural effusion.   No shift or pneumothorax. No masses or adenopathy.       IMPRESSION: No acute changes.  Signing/Reading Doctor: Lannette Donath [phone])    Approved: Lannette Donath [phone])  Feb 05 2014  4:45AM                                      Reviewed records in [facility] and media tab today    Lab Review   Results for orders placed or performed in visit on 11/06/14   CBC WITH AUTOMATED DIFF   Result Value Ref Range    WBC 6.7 3.4 - 10.8 x10E3/uL    RBC 4.18 3.77 - 5.28 x10E6/uL    HGB 11.3 11.1 - 15.9 g/dL    HCT 33.8 (L) 34.0 - 46.6 %    MCV 81 79 - 97 fL    MCH 27.0 26.6 - 33.0 pg    MCHC 33.4 31.5 - 35.7 g/dL    RDW 15.8 (H) 12.3 - 15.4 %    PLATELET 289 150 - 379 x10E3/uL    NEUTROPHILS 55 %    Lymphocytes 31 %    MONOCYTES 7 %    EOSINOPHILS 6 %    BASOPHILS 1 %    ABS. NEUTROPHILS 3.8 1.4 - 7.0 x10E3/uL    Abs Lymphocytes 2.1 0.7 - 3.1 x10E3/uL    ABS. MONOCYTES 0.4 0.1 - 0.9 x10E3/uL    ABS. EOSINOPHILS 0.4 0.0 - 0.4 x10E3/uL    ABS. BASOPHILS 0.0 0.0 - 0.2 x10E3/uL    IMMATURE GRANULOCYTES 0 %    ABS. IMM. GRANS. 0.0 0.0 - 0.1 x10E3/uL   URINALYSIS W/ RFLX MICROSCOPIC    Result Value Ref Range    Specific Gravity 1.019 1.005 - 1.030    pH (UA) 6.5 5.0 - 7.5    Color Yellow Yellow    Appearance Clear Clear    Leukocyte Esterase 1+ (A) Negative    Protein Negative Negative/Trace    Glucose Negative Negative    Ketone Negative Negative    Blood Negative Negative    Bilirubin Negative Negative    Urobilinogen 0.2 0.2 - 1.0 mg/dL    Nitrites Negative Negative    Microscopic Examination See additional order    METABOLIC PANEL, COMPREHENSIVE   Result Value Ref Range    Glucose 83 65 - 99 mg/dL    BUN 13 8 - 27 mg/dL    Creatinine 0.67 0.57 - 1.00 mg/dL    GFR est non-AA 95 >59 mL/min/1.73    GFR est AA 110 >59 mL/min/1.73    BUN/Creatinine ratio 19 11 - 26    Sodium 142 134 - 144 mmol/L    Potassium 4.3 3.5 - 5.2 mmol/L    Chloride 102 97 - 108 mmol/L    CO2 22 18 - 29 mmol/L    Calcium 9.3 8.7 - 10.3 mg/dL    Protein, total 7.0 6.0 - 8.5 g/dL    Albumin 4.4 3.6 - 4.8 g/dL    GLOBULIN, TOTAL 2.6 1.5 - 4.5 g/dL    A-G Ratio 1.7 1.1 - 2.5    Bilirubin, total 0.3 0.0 - 1.2 mg/dL    Alk. phosphatase 109 39 - 117 IU/L    AST 17 0 - 40 IU/L    ALT 13 0 - 32 IU/L   LIPID PANEL   Result Value Ref Range    Cholesterol, total 274 (H) 100 - 199 mg/dL    Triglyceride 297 (H) 0 - 149 mg/dL  HDL Cholesterol 58 >39 mg/dL    VLDL, calculated 59 (H) 5 - 40 mg/dL    LDL, calculated 157 (H) 0 - 99 mg/dL   TSH 3RD GENERATION   Result Value Ref Range    TSH 1.940 0.450 - 4.500 uIU/mL   HCV AB W/RFLX TO NAA   Result Value Ref Range    HCV Ab <0.1 0.0 - 0.9 s/co ratio   VITAMIN D, 25 HYDROXY   Result Value Ref Range    VITAMIN D, 25-HYDROXY 9.9 (L) 30.0 - 100.0 ng/mL   HEMOGLOBIN A1C WITH EAG   Result Value Ref Range    Hemoglobin A1c 6.0 (H) 4.8 - 5.6 %    Estimated average glucose 126 mg/dL   MICROSCOPIC EXAMINATION   Result Value Ref Range    WBC 6-10 (A) 0 - 5 /hpf    RBC 0-2 0 - 2 /hpf    Epithelial cells 0-10 0 - 10 /hpf    Casts None seen None seen /lpf    Mucus Present Not Estab.     Bacteria Few None seen/Few   INTERPRETATION   Result Value Ref Range    HCV Interpretation Comment    CVD REPORT   Result Value Ref Range    INTERPRETATION Note         Exam:  Visit Vitals   ??? BP 112/80   ??? Pulse 80   ??? Resp 18   ??? Ht '5\' 3"'  (1.6 m)   ??? Wt 76.7 kg (169 lb)   ??? SpO2 98%   ??? BMI 29.94 kg/m2     General:  Alert, cooperative, no distress.   Head:  Normocephalic, without obvious abnormality, atraumatic.   Respiratory:  Heart:   Non labored breathing  Regular rate and rhythm, no murmurs       Extremities: Warm, no cyanosis or edema.   Pulses: 2+ radial pulses.       Neurologic:  MS: Alert and oriented x 4, speech intact. Language - inact. Attention and fund of knowledge appropriate.  Recent and remote memory intact.  Cranial Nerves:  II: visual fields Full to confrontation   II: pupils Equal, round, reactive to light   II: optic disc    III,VII: ptosis Mild right ptosis - stable   III,IV,VI: extraocular muscles  EOMI, no nystagmus or diplopia   V: facial light touch sensation     VII: facial muscle function   symmetric   VIII: hearing intact   IX: soft palate elevation  normal   XI: trapezius strength     XI: sternocleidomastoid strength    XII: tongue  Midline     Motor: normal bulk and tone, no tremor              Strength: 5/5 throughout, no PD    Coordination: FTN and RAM intact  Gait: normal gait  Reflexes: 2+ symmetric           Assessment/Plan   Pt is a 59 y.o. right handed female with h/o radiographic changes c/w MS, but no clinical symptoms, last MRI brain 10/22/14 was stable. Additionally, pt c/o frequent falls with injuries without obvious neurological etiology, but could be medication effects with use of Valium 60m bid, [facility]  More recently c/o an ill-defined irritating sensation in her legs with urge to move her legs beginning around 6PM and keeping her awake, c/w RLS, improved on Requip 11mqhs, but  intermingling symptoms  from OA of knees.  Finally, pt with concerns about personality changes describing lack of motivation, not keeping up with house work/office work/or personal care, memory loss, fatigue, and new panic attacks, suspected due to mood disorder, ADHD, and stressors.  Exam is unchanged with mild right ptosis, MMSE not repeated today, score 03/2015 was 30/30.  Encouraged pt to follow psychiatrist's recommendation to start Wellbutrin to see if cognitive/behavior complaints improve.  Continue Requip 1 mg nightly for RLS.  Follow-up in clinic in 4 months, instructed to call in the interim with any questions or concerns.      ICD-10-CM ICD-9-CM    1. RLS (restless legs syndrome) G25.81 333.94    2. Abnormal brain scan R94.02 794.09    3. Memory loss R41.3 780.93        Signed:  Chauncey Reading, MD  06/23/2015

## 2015-07-24 ENCOUNTER — Encounter

## 2015-07-24 NOTE — Telephone Encounter (Signed)
Patient was supposed to follow up 3 months from last visit to have this rechecked, please call patient & advise of this. Can send in a small supply to last once appt is established. The appt scheduled in May is too far out.

## 2015-07-24 NOTE — Telephone Encounter (Signed)
Left v/m for patient to call the office and set up appt to have Vitamin D rechecked.

## 2015-08-06 ENCOUNTER — Encounter: Attending: Internal Medicine | Primary: Internal Medicine

## 2015-08-19 ENCOUNTER — Encounter: Attending: Internal Medicine | Primary: Internal Medicine

## 2015-09-01 MED ORDER — ROPINIROLE 0.25 MG TAB
0.25 mg | ORAL_TABLET | ORAL | 0 refills | Status: DC
Start: 2015-09-01 — End: 2015-09-28

## 2015-09-29 MED ORDER — ROPINIROLE 1 MG TAB
1 mg | ORAL_TABLET | ORAL | 3 refills | Status: DC
Start: 2015-09-29 — End: 2015-12-02

## 2015-10-27 ENCOUNTER — Inpatient Hospital Stay: Admit: 2015-10-27 | Payer: BLUE CROSS/BLUE SHIELD | Primary: Internal Medicine

## 2015-10-27 ENCOUNTER — Ambulatory Visit: Payer: BLUE CROSS/BLUE SHIELD | Primary: Internal Medicine

## 2015-10-27 ENCOUNTER — Encounter: Primary: Internal Medicine

## 2015-10-27 DIAGNOSIS — Z01818 Encounter for other preprocedural examination: Secondary | ICD-10-CM

## 2015-10-27 LAB — TYPE AND SCREEN
ABO/Rh: AB POS
Antibody Screen: NEGATIVE

## 2015-10-27 LAB — EKG, 12 LEAD, INITIAL
Atrial Rate: 58 {beats}/min
Calculated P Axis: 12 degrees
Calculated R Axis: 6 degrees
Calculated T Axis: 31 degrees
P-R Interval: 152 ms
Q-T Interval: 468 ms
QRS Duration: 78 ms
QTC Calculation (Bezet): 459 ms
Ventricular Rate: 58 {beats}/min

## 2015-10-27 LAB — METABOLIC PANEL, BASIC
Anion gap: 6 mmol/L (ref 5–15)
BUN/Creatinine ratio: 14 (ref 12–20)
BUN: 12 MG/DL (ref 6–20)
CO2: 29 mmol/L (ref 21–32)
Calcium: 8.8 MG/DL (ref 8.5–10.1)
Chloride: 102 mmol/L (ref 97–108)
Creatinine: 0.86 MG/DL (ref 0.55–1.02)
GFR est AA: >60 mL/min/{1.73_m2} (ref >60)
GFR est non-AA: >60 mL/min/{1.73_m2} (ref >60)
Glucose: 102 mg/dL — ABNORMAL HIGH (ref 65–100)
Potassium: 4.5 mmol/L (ref 3.5–5.1)
Sodium: 137 mmol/L (ref 136–145)

## 2015-10-27 LAB — TYPE & SCREEN
ABO/Rh(D): AB POS
Antibody screen: NEGATIVE

## 2015-10-27 LAB — CBC W/O DIFF
HCT: 33 % — ABNORMAL LOW (ref 35.0–47.0)
HGB: 10.4 g/dL — ABNORMAL LOW (ref 11.5–16.0)
MCH: 26 PG (ref 26.0–34.0)
MCHC: 31.5 g/dL (ref 30.0–36.5)
MCV: 82.5 FL (ref 80.0–99.0)
PLATELET: 233 10*3/uL (ref 150–400)
RBC: 4 M/uL (ref 3.80–5.20)
RDW: 13.6 % (ref 11.5–14.5)
WBC: 4.7 10*3/uL (ref 3.6–11.0)

## 2015-10-27 LAB — URINALYSIS W/ REFLEX CULTURE
Bacteria: NEGATIVE /hpf
Bilirubin: NEGATIVE
Blood: NEGATIVE
Glucose: NEGATIVE mg/dL
Ketone: NEGATIVE mg/dL
Leukocyte Esterase: NEGATIVE
Nitrites: NEGATIVE
Protein: NEGATIVE mg/dL
Specific gravity: 1.013 (ref 1.003–1.030)
Urobilinogen: 0.2 EU/dL (ref 0.2–1.0)
pH (UA): 7.5 (ref 5.0–8.0)

## 2015-10-27 LAB — HEMOGLOBIN A1C WITH EAG
Est. average glucose: 123 mg/dL
Hemoglobin A1c: 5.9 % (ref 4.2–6.3)

## 2015-10-27 LAB — PROTHROMBIN TIME + INR
INR: 1 (ref 0.9–1.1)
Prothrombin time: 9.8 s (ref 9.0–11.1)

## 2015-10-27 NOTE — Other (Signed)
PREOPERATIVE INSTRUCTIONS REVIEWED WITH PATIENT.  PATIENT GIVEN SIX PACK OF CHG WIPES.  INSTRUCTIONS  TO BE REVIEWED  IN CLASS] ON USE OF CHG WIPES.  PATIENT GIVEN SSI INFECTION FAQ SHEET, INFORMATION SHEET ON DIABETIC TREATMENT CENTER AS WELL AS HAND WASHING TIPS SHEETS.  MRSA/MSSA TREATMENT INSTRUCTION SHEET GIVEN WITH AN EXPLANATION TO PATIENT THAT THEY WILL BE NOTIFIED IF TREATMENT INSTRUCTIONS NEED TO BE INITIATED.  PATIENT WAS GIVEN THE OPPORTUNITY TO ASK QUESTIONS ON THE INFORMATION PROVIDED.

## 2015-10-28 ENCOUNTER — Encounter: Attending: Neurology | Primary: Internal Medicine

## 2015-10-28 LAB — CULTURE, MRSA

## 2015-10-28 NOTE — H&P (Addendum)
HISTORY OF PRESENT ILLNESS:  Lindsay [name] comes in for evaluation of her knee.  She has quite severe pain in both knees.  I last saw her in November of 2016.  She has had a number of steroid injections and she feels that her last injections really did not help.  She does not have hip pain.  Does not have symptoms of infection.  Does not have symptoms of nerve impingement.      PAST MEDICAL HISTORY:  As per updated history form provided today history of anemia, prior blood clots, and osteoporosis.    DRUG ALLERGIES:  No known drug allergies.    SURGICAL HISTORY:  Includes prior C-sections and rotator cuff repair.    FAMILY HISTORY:  Heart disease.    SOCIAL HISTORY:  The patient is a nonsmoker.  Occasional beer drinker.    REVIEW OF SYSTEMS:  All systems are reviewed and are negative.    PHYSICAL EXAMINATION:   On exam Lindsay [name] is alert and oriented x 3, she is in no acute distress.  Respiratory rate is 15.  On exam of the lower extremities, no evidence of calf pain, DVT, cellulitis, rash or infection.  On the left knee she has marked patellofemoral crepitance.  She lacks 5 degrees full extension, flexes 119 degrees with diffuse tenderness and crepitance.  The knee is stable.  I do not think she has an effusion.  Extensor mechanism is intact.  On exam of the right knee she lacks 4 degrees full extension, flexes 117 degrees.  There is diffuse tenderness and crepitance to range of motion of the right knee.  No effusion.    X-RAYS:  I ordered x-rays of bilateral knees, interpreted x-rays of bilateral knees.  On the left knee there is marked medial joint space narrowing but there is also patellofemoral degenerative changes as well.  Slight varus deformity.  On the right knee there is near complete loss of medial joint space narrowing.  Also significant patellofemoral degenerative changes.    IMPRESSION:  Advanced degenerative arthritis bilateral knees.     Lindsay [name] indicates she is quite symptomatic.  She has quite severe pain. She has pain in both knees.  She indicates that she has more pain on the left knee.  She feels that she has failed a long course of conservative treatment including a number of injections.      PLAN:  We discussed treatment options.   I feel that she will need total knee replacement.  She wants to go ahead with left total knee replacement.  She understands that she may eventually need right total knee replacement.  I reviewed with her the procedure, the postop course, the postop rehab.  She tells me that she had a pulmonary embolus in 2014.  She was treated with Xarelto.  I would recommend Xarelto as well and she does understand the potential for DVT and pulmonary embolus.  She understands all other potential complications.    PROCEDURE: Left total knee replacement.      Date of Surgery Update:  MIKALA PODOLL was seen and examined.  Past Medical History:   Diagnosis Date   ??? Abnormal brain scan    ??? Arthritis    ??? Chronic pain    ??? Depression    ??? Falls frequently    ??? Fracture of left ankle    ??? Headache 2011   ??? Ill-defined condition 09/15/2015    FX R HAND   ??? Other and unspecified  hyperlipidemia 12/27/2012   ??? PE (pulmonary embolism)    ??? Torn rotator cuff     right     Prior to Admission Medications   Prescriptions Last Dose Informant Patient Reported? Taking?   diazepam (VALIUM) 10 mg tablet   No No   Sig: Take 1 tablet by mouth daily for anxiety   Patient taking differently: 5 MG IN AM AND 10 MG HS   ergocalciferol (ERGOCALCIFEROL) 50,000 unit capsule   No No   Sig: Take 1 Cap by mouth every seven (7) days.   escitalopram oxalate (LEXAPRO) 20 mg tablet   No No   Sig: Take 1 Tab by mouth daily.   Patient taking differently: Take 20 mg by mouth nightly.   ibuprofen (MOTRIN) 200 mg tablet   Yes No   Sig: Take 400 mg by mouth every six (6) hours as needed.   omeprazole (PRILOSEC) 20 mg capsule   Yes No   Sig: Take 20 mg by mouth nightly.    rOPINIRole (REQUIP) 1 mg tablet   No No   Sig: Take 1 tab 1-3 hours before bedtime   Patient taking differently: 1.5 mg nightly. Take 1 tab 1-3 hours before bedtime      Facility-Administered Medications: None      Allergy JX:BJYNWGto:Review of patient's allergies indicates no known allergies.  Physical Examination: General appearance - alert, well appearing, and in no distress  History and physical has been reviewed. The patient has been examined. There have been no significant clinical changes since the completion of the originally dated History and Physical.    Signed By: Asher MuirWilliam S Jonel Weldon, PA-C     October 29, 2015 7:29 AM

## 2015-10-29 ENCOUNTER — Inpatient Hospital Stay
Admit: 2015-10-29 | Discharge: 2015-10-31 | Disposition: A | Discharge status: Home Health | Payer: BLUE CROSS/BLUE SHIELD | Attending: Orthopaedic Surgery | Admitting: Orthopaedic Surgery

## 2015-10-29 DIAGNOSIS — M1712 Unilateral primary osteoarthritis, left knee: Secondary | ICD-10-CM

## 2015-10-29 LAB — GLUCOSE, POC: Glucose (POC): 107 mg/dL — ABNORMAL HIGH (ref 65–100)

## 2015-10-29 MED ORDER — SENNOSIDES-DOCUSATE SODIUM 8.6 MG-50 MG TAB
Freq: Two times a day (BID) | ORAL | Status: DC
Start: 2015-10-29 — End: 2015-10-31
  Administered 2015-10-30 – 2015-10-31 (×3): via ORAL

## 2015-10-29 MED ORDER — SENNOSIDES-DOCUSATE SODIUM 8.6 MG-50 MG TAB
Freq: Two times a day (BID) | ORAL | Status: DC
Start: 2015-10-29 — End: 2015-10-29

## 2015-10-29 MED ORDER — MIDAZOLAM 1 MG/ML IJ SOLN
1 mg/mL | INTRAMUSCULAR | Status: AC
Start: 2015-10-29 — End: ?

## 2015-10-29 MED ORDER — SODIUM CHLORIDE 0.9 % IV
1000 mg/10 mL (100 mg/mL) | Freq: Once | INTRAVENOUS | Status: DC
Start: 2015-10-29 — End: 2015-10-29
  Administered 2015-10-29: 12:00:00 via INTRAVENOUS

## 2015-10-29 MED ORDER — ONDANSETRON (PF) 4 MG/2 ML INJECTION
4 mg/2 mL | INTRAMUSCULAR | Status: AC | PRN
Start: 2015-10-29 — End: 2015-10-30

## 2015-10-29 MED ORDER — BUPIVACAINE LIPOSOME (PF) 266 MG/20 ML (13.3 MG/ML) SUSP, INFILTRATION
1.3 % (13.3 mg/mL) | Status: AC
Start: 2015-10-29 — End: ?

## 2015-10-29 MED ORDER — ACETAMINOPHEN 325 MG TABLET
325 mg | Freq: Four times a day (QID) | ORAL | Status: DC
Start: 2015-10-29 — End: 2015-10-31
  Administered 2015-10-29 – 2015-10-31 (×9): via ORAL

## 2015-10-29 MED ORDER — ONDANSETRON (PF) 4 MG/2 ML INJECTION
4 mg/2 mL | INTRAMUSCULAR | Status: DC | PRN
Start: 2015-10-29 — End: 2015-10-29
  Administered 2015-10-29: 16:00:00 via INTRAVENOUS

## 2015-10-29 MED ORDER — CEFAZOLIN 2 GRAM/50 ML NS IVPB
Freq: Three times a day (TID) | INTRAVENOUS | Status: AC
Start: 2015-10-29 — End: 2015-10-30
  Administered 2015-10-29 – 2015-10-30 (×2): via INTRAVENOUS

## 2015-10-29 MED ORDER — FENTANYL CITRATE (PF) 50 MCG/ML IJ SOLN
50 mcg/mL | INTRAMUSCULAR | Status: DC | PRN
Start: 2015-10-29 — End: 2015-10-29
  Administered 2015-10-29 (×2): via INTRAVENOUS

## 2015-10-29 MED ORDER — HYDROMORPHONE (PF) 1 MG/ML IJ SOLN
1 mg/mL | INTRAMUSCULAR | Status: AC | PRN
Start: 2015-10-29 — End: 2015-10-30

## 2015-10-29 MED ORDER — FAMOTIDINE 20 MG TAB
20 mg | Freq: Two times a day (BID) | ORAL | Status: DC | PRN
Start: 2015-10-29 — End: 2015-10-31

## 2015-10-29 MED ORDER — LACTATED RINGERS IV
INTRAVENOUS | Status: DC
Start: 2015-10-29 — End: 2015-10-29
  Administered 2015-10-29: 17:00:00 via INTRAVENOUS

## 2015-10-29 MED ORDER — SODIUM CHLORIDE 0.9 % IJ SYRG
Freq: Three times a day (TID) | INTRAMUSCULAR | Status: DC
Start: 2015-10-29 — End: 2015-10-29
  Administered 2015-10-29: 12:00:00 via INTRAVENOUS

## 2015-10-29 MED ORDER — SODIUM CHLORIDE 0.9 % IJ SYRG
INTRAMUSCULAR | Status: DC | PRN
Start: 2015-10-29 — End: 2015-10-29

## 2015-10-29 MED ORDER — BUPIVACAINE LIPOSOME (PF) 266 MG/20 ML (13.3 MG/ML) SUSP, INFILTRATION
1.3 % (13.3 mg/mL) | Freq: Once | Status: DC
Start: 2015-10-29 — End: 2015-10-29
  Administered 2015-10-29: 16:00:00

## 2015-10-29 MED ORDER — MIDAZOLAM 1 MG/ML IJ SOLN
1 mg/mL | INTRAMUSCULAR | Status: DC | PRN
Start: 2015-10-29 — End: 2015-10-29
  Administered 2015-10-29: 12:00:00 via INTRAVENOUS

## 2015-10-29 MED ORDER — SODIUM CHLORIDE 0.9 % IJ SYRG
INTRAMUSCULAR | Status: DC | PRN
Start: 2015-10-29 — End: 2015-10-31

## 2015-10-29 MED ORDER — ACETAMINOPHEN 325 MG TABLET
325 mg | Freq: Once | ORAL | Status: AC
Start: 2015-10-29 — End: 2015-10-29
  Administered 2015-10-29: 12:00:00 via ORAL

## 2015-10-29 MED ORDER — MORPHINE 2 MG/ML INJECTION
2 mg/mL | Freq: Once | INTRAMUSCULAR | Status: AC
Start: 2015-10-29 — End: 2015-10-29
  Administered 2015-10-29 (×2): via INTRA_ARTICULAR

## 2015-10-29 MED ORDER — BUPIVACAINE (PF) 0.5 % (5 MG/ML) IJ SOLN
0.5 % (5 mg/mL) | INTRAMUSCULAR | Status: DC | PRN
Start: 2015-10-29 — End: 2015-10-29
  Administered 2015-10-29: 15:00:00 via INTRATHECAL

## 2015-10-29 MED ORDER — PANTOPRAZOLE 40 MG TAB, DELAYED RELEASE
40 mg | Freq: Every evening | ORAL | Status: DC
Start: 2015-10-29 — End: 2015-10-31
  Administered 2015-10-30 – 2015-10-31 (×2): via ORAL

## 2015-10-29 MED ORDER — FENTANYL CITRATE (PF) 50 MCG/ML IJ SOLN
50 mcg/mL | INTRAMUSCULAR | Status: DC | PRN
Start: 2015-10-29 — End: 2015-10-29
  Administered 2015-10-29: 12:00:00 via INTRAVENOUS

## 2015-10-29 MED ORDER — OXYCODONE 5 MG TAB
5 mg | ORAL | Status: DC | PRN
Start: 2015-10-29 — End: 2015-10-31
  Administered 2015-10-30 – 2015-10-31 (×4): via ORAL

## 2015-10-29 MED ORDER — SODIUM CHLORIDE 0.9 % IV
INTRAVENOUS | Status: DC
Start: 2015-10-29 — End: 2015-10-29
  Administered 2015-10-29: 12:00:00 via INTRAVENOUS

## 2015-10-29 MED ORDER — PROPOFOL 10 MG/ML IV EMUL
10 mg/mL | INTRAVENOUS | Status: DC | PRN
Start: 2015-10-29 — End: 2015-10-29
  Administered 2015-10-29 (×3): via INTRAVENOUS

## 2015-10-29 MED ORDER — CELECOXIB 200 MG CAP
200 mg | Freq: Once | ORAL | Status: AC
Start: 2015-10-29 — End: 2015-10-29
  Administered 2015-10-29: 12:00:00 via ORAL

## 2015-10-29 MED ORDER — CEFAZOLIN 2 GRAM/50 ML NS IVPB
Freq: Three times a day (TID) | INTRAVENOUS | Status: DC
Start: 2015-10-29 — End: 2015-10-29
  Administered 2015-10-29 (×2): via INTRAVENOUS

## 2015-10-29 MED ORDER — BACITRACIN 50,000 UNIT IM
50000 unit | Freq: Once | INTRAMUSCULAR | Status: AC
Start: 2015-10-29 — End: 2015-10-29
  Administered 2015-10-29 (×2)

## 2015-10-29 MED ORDER — NALOXONE 0.4 MG/ML INJECTION
0.4 mg/mL | INTRAMUSCULAR | Status: DC | PRN
Start: 2015-10-29 — End: 2015-10-29

## 2015-10-29 MED ORDER — RIVAROXABAN 10 MG TAB
10 mg | Freq: Every day | ORAL | Status: DC
Start: 2015-10-29 — End: 2015-10-31
  Administered 2015-10-29 – 2015-10-31 (×3): via ORAL

## 2015-10-29 MED ORDER — HYDROMORPHONE (PF) 1 MG/ML IJ SOLN
1 mg/mL | INTRAMUSCULAR | Status: DC | PRN
Start: 2015-10-29 — End: 2015-10-29

## 2015-10-29 MED ORDER — CELECOXIB 200 MG CAP
200 mg | Freq: Two times a day (BID) | ORAL | Status: DC
Start: 2015-10-29 — End: 2015-10-31
  Administered 2015-10-29 – 2015-10-31 (×4): via ORAL

## 2015-10-29 MED ORDER — ONDANSETRON (PF) 4 MG/2 ML INJECTION
4 mg/2 mL | INTRAMUSCULAR | Status: DC | PRN
Start: 2015-10-29 — End: 2015-10-29

## 2015-10-29 MED ORDER — MORPHINE (PF) 0.5 MG/ML IJ SOLN
0.5 mg/mL | INTRAMUSCULAR | Status: AC
Start: 2015-10-29 — End: ?

## 2015-10-29 MED ORDER — SODIUM CHLORIDE 0.9 % IJ SYRG
Freq: Three times a day (TID) | INTRAMUSCULAR | Status: DC
Start: 2015-10-29 — End: 2015-10-31
  Administered 2015-10-30 – 2015-10-31 (×3): via INTRAVENOUS

## 2015-10-29 MED ORDER — MORPHINE (PF) 0.5 MG/ML IJ SOLN
0.5 mg/mL | INTRAMUSCULAR | Status: DC | PRN
Start: 2015-10-29 — End: 2015-10-29
  Administered 2015-10-29: 15:00:00 via INTRAVENOUS

## 2015-10-29 MED ORDER — MORPHINE 10 MG/ML INJ SOLUTION
10 mg/ml | INTRAMUSCULAR | Status: DC | PRN
Start: 2015-10-29 — End: 2015-10-29

## 2015-10-29 MED ORDER — DEXAMETHASONE SODIUM PHOSPHATE 4 MG/ML IJ SOLN
4 mg/mL | Freq: Once | INTRAMUSCULAR | Status: DC
Start: 2015-10-29 — End: 2015-10-29
  Administered 2015-10-29: 12:00:00 via INTRAVENOUS

## 2015-10-29 MED ORDER — SODIUM CHLORIDE 0.9 % IV
INTRAVENOUS | Status: DC
Start: 2015-10-29 — End: 2015-10-30
  Administered 2015-10-29 – 2015-10-30 (×2): via INTRAVENOUS

## 2015-10-29 MED ORDER — PROPOFOL 10 MG/ML IV EMUL
10 mg/mL | INTRAVENOUS | Status: DC | PRN
Start: 2015-10-29 — End: 2015-10-29
  Administered 2015-10-29: 15:00:00 via INTRAVENOUS

## 2015-10-29 MED ORDER — FENTANYL CITRATE (PF) 50 MCG/ML IJ SOLN
50 mcg/mL | INTRAMUSCULAR | Status: DC | PRN
Start: 2015-10-29 — End: 2015-10-29

## 2015-10-29 MED ORDER — POLYETHYLENE GLYCOL 3350 17 GRAM (100 %) ORAL POWDER PACKET
17 gram | Freq: Every day | ORAL | Status: DC
Start: 2015-10-29 — End: 2015-10-29

## 2015-10-29 MED ORDER — DEXAMETHASONE SODIUM PHOSPHATE 4 MG/ML IJ SOLN
4 mg/mL | INTRAMUSCULAR | Status: DC | PRN
Start: 2015-10-29 — End: 2015-10-29
  Administered 2015-10-29: 15:00:00 via INTRAVENOUS

## 2015-10-29 MED ORDER — LACTATED RINGERS IV
INTRAVENOUS | Status: DC | PRN
Start: 2015-10-29 — End: 2015-10-29
  Administered 2015-10-29 (×2): via INTRAVENOUS

## 2015-10-29 MED ORDER — NALBUPHINE 10 MG/ML INJECTION
10 mg/mL | INTRAMUSCULAR | Status: AC | PRN
Start: 2015-10-29 — End: 2015-10-30

## 2015-10-29 MED ORDER — SODIUM CHLORIDE 0.9% BOLUS IV
0.9 % | Freq: Once | INTRAVENOUS | Status: DC | PRN
Start: 2015-10-29 — End: 2015-10-31

## 2015-10-29 MED ORDER — SODIUM CHLORIDE 0.9 % IJ SYRG
Freq: Three times a day (TID) | INTRAMUSCULAR | Status: DC
Start: 2015-10-29 — End: 2015-10-31
  Administered 2015-10-29 – 2015-10-30 (×4): via INTRAVENOUS

## 2015-10-29 MED ORDER — DEXTROSE 5%-LACTATED RINGERS IV
INTRAVENOUS | Status: DC
Start: 2015-10-29 — End: 2015-10-29
  Administered 2015-10-29: 12:00:00 via INTRAVENOUS

## 2015-10-29 MED ORDER — ROPIVACAINE (PF) 5 MG/ML (0.5 %) INJECTION
5 mg/mL (0. %) | INTRAMUSCULAR | Status: AC
Start: 2015-10-29 — End: ?

## 2015-10-29 MED ORDER — DEXAMETHASONE SODIUM PHOSPHATE 4 MG/ML IJ SOLN
4 mg/mL | Freq: Once | INTRAMUSCULAR | Status: AC
Start: 2015-10-29 — End: 2015-10-30
  Administered 2015-10-30: 14:00:00 via INTRAVENOUS

## 2015-10-29 MED ORDER — LACTATED RINGERS IV
INTRAVENOUS | Status: DC
Start: 2015-10-29 — End: 2015-10-29
  Administered 2015-10-29: 12:00:00 via INTRAVENOUS

## 2015-10-29 MED ORDER — FENTANYL CITRATE (PF) 50 MCG/ML IJ SOLN
50 mcg/mL | INTRAMUSCULAR | Status: AC
Start: 2015-10-29 — End: ?

## 2015-10-29 MED ORDER — ESCITALOPRAM 10 MG TAB
10 mg | Freq: Every evening | ORAL | Status: DC
Start: 2015-10-29 — End: 2015-10-31
  Administered 2015-10-30 – 2015-10-31 (×2): via ORAL

## 2015-10-29 MED ORDER — DEXAMETHASONE SODIUM PHOSPHATE (PF) 10 MG/ML INJECTION
10 mg/mL | INTRAMUSCULAR | Status: AC
Start: 2015-10-29 — End: ?

## 2015-10-29 MED ORDER — DIAZEPAM 2 MG TAB
2 mg | Freq: Every day | ORAL | Status: DC
Start: 2015-10-29 — End: 2015-10-31
  Administered 2015-10-30: 13:00:00 via ORAL

## 2015-10-29 MED ORDER — DIAZEPAM 2 MG TAB
2 mg | Freq: Every evening | ORAL | Status: DC
Start: 2015-10-29 — End: 2015-10-31
  Administered 2015-10-30 – 2015-10-31 (×2): via ORAL

## 2015-10-29 MED ORDER — ROPINIROLE 1 MG TAB
1 mg | Freq: Every evening | ORAL | Status: DC
Start: 2015-10-29 — End: 2015-10-31
  Administered 2015-10-30 – 2015-10-31 (×2): via ORAL

## 2015-10-29 MED ORDER — BISACODYL 10 MG RECTAL SUPPOSITORY
10 mg | Freq: Every day | RECTAL | Status: DC | PRN
Start: 2015-10-29 — End: 2015-10-29

## 2015-10-29 MED ORDER — ONDANSETRON (PF) 4 MG/2 ML INJECTION
4 mg/2 mL | Freq: Once | INTRAMUSCULAR | Status: DC
Start: 2015-10-29 — End: 2015-10-29

## 2015-10-29 MED ORDER — MIDAZOLAM 1 MG/ML IJ SOLN
1 mg/mL | INTRAMUSCULAR | Status: DC | PRN
Start: 2015-10-29 — End: 2015-10-29

## 2015-10-29 MED ORDER — DIPHENHYDRAMINE HCL 50 MG/ML IJ SOLN
50 mg/mL | INTRAMUSCULAR | Status: DC | PRN
Start: 2015-10-29 — End: 2015-10-29

## 2015-10-29 MED ORDER — TRANEXAMIC ACID 1,000 MG/10 ML (100 MG/ML) IV
1000 mg/10 mL (100 mg/mL) | Freq: Once | INTRAVENOUS | Status: DC
Start: 2015-10-29 — End: 2015-10-29
  Administered 2015-10-29: 17:00:00 via TOPICAL

## 2015-10-29 MED ORDER — MORPHINE 10 MG/ML INJ SOLUTION
10 mg/ml | INTRAMUSCULAR | Status: AC
Start: 2015-10-29 — End: ?

## 2015-10-29 MED ORDER — POLYETHYLENE GLYCOL 3350 17 GRAM (100 %) ORAL POWDER PACKET
17 gram | Freq: Every day | ORAL | Status: DC | PRN
Start: 2015-10-29 — End: 2015-10-31

## 2015-10-29 MED ORDER — SODIUM CHLORIDE 0.9% BOLUS IV
0.9 % | Freq: Once | INTRAVENOUS | Status: DC | PRN
Start: 2015-10-29 — End: 2015-10-29

## 2015-10-29 MED ORDER — BUPIVACAINE (PF) 0.75 % (7.5 MG/ML) IJ SOLN
0.75 % (7.5 mg/mL) | INTRAMUSCULAR | Status: DC | PRN
Start: 2015-10-29 — End: 2015-10-29
  Administered 2015-10-29: 15:00:00 via INTRATHECAL

## 2015-10-29 MED ORDER — PHENYLEPHRINE IN 0.9 % SODIUM CL (40 MCG/ML) IV SYRINGE
0.4 mg/10 mL (40 mcg/mL) | INTRAVENOUS | Status: DC | PRN
Start: 2015-10-29 — End: 2015-10-29
  Administered 2015-10-29 (×4): via INTRAVENOUS

## 2015-10-29 MED ORDER — HYDROXYZINE 10 MG TAB
10 mg | Freq: Three times a day (TID) | ORAL | Status: DC | PRN
Start: 2015-10-29 — End: 2015-10-31
  Administered 2015-10-29: 20:00:00 via ORAL

## 2015-10-29 MED ORDER — NALOXONE 0.4 MG/ML INJECTION
0.4 mg/mL | INTRAMUSCULAR | Status: DC | PRN
Start: 2015-10-29 — End: 2015-10-31

## 2015-10-29 MED ORDER — EPHEDRINE SULFATE 50 MG/ML IJ SOLN
50 mg/mL | INTRAMUSCULAR | Status: DC | PRN
Start: 2015-10-29 — End: 2015-10-29

## 2015-10-29 MED ORDER — SODIUM CHLORIDE 0.9 % IV
INTRAVENOUS | Status: DC
Start: 2015-10-29 — End: 2015-10-29
  Administered 2015-10-29: 17:00:00 via INTRAVENOUS

## 2015-10-29 MED ORDER — MIDAZOLAM 1 MG/ML IJ SOLN
1 mg/mL | INTRAMUSCULAR | Status: DC | PRN
Start: 2015-10-29 — End: 2015-10-29
  Administered 2015-10-29 (×2): via INTRAVENOUS

## 2015-10-29 MED ORDER — OXYCODONE 5 MG TAB
5 mg | ORAL | Status: DC | PRN
Start: 2015-10-29 — End: 2015-10-31

## 2015-10-29 MED ORDER — LIDOCAINE (PF) 10 MG/ML (1 %) IJ SOLN
10 mg/mL (1 %) | INTRAMUSCULAR | Status: DC | PRN
Start: 2015-10-29 — End: 2015-10-29

## 2015-10-29 MED FILL — NAROPIN (PF) 5 MG/ML (0.5 %) INJECTION SOLUTION: 5 mg/mL (0. %) | INTRAMUSCULAR | Qty: 30

## 2015-10-29 MED FILL — BUPIVACAINE (PF) 0.75 % (7.5 MG/ML) IJ SOLN: 0.75 % (7.5 mg/mL) | INTRAMUSCULAR | Qty: 12

## 2015-10-29 MED FILL — CEFAZOLIN 2 GRAM/50 ML NS IVPB: INTRAVENOUS | Qty: 50

## 2015-10-29 MED FILL — BD POSIFLUSH NORMAL SALINE 0.9 % INJECTION SYRINGE: INTRAMUSCULAR | Qty: 10

## 2015-10-29 MED FILL — ACETAMINOPHEN 325 MG TABLET: 325 mg | ORAL | Qty: 2

## 2015-10-29 MED FILL — DEXAMETHASONE SODIUM PHOSPHATE 4 MG/ML IJ SOLN: 4 mg/mL | INTRAMUSCULAR | Qty: 10

## 2015-10-29 MED FILL — SODIUM CHLORIDE 0.9 % IV: INTRAVENOUS | Qty: 500

## 2015-10-29 MED FILL — CELEBREX 200 MG CAPSULE: 200 mg | ORAL | Qty: 1

## 2015-10-29 MED FILL — DEXAMETHASONE SODIUM PHOSPHATE (PF) 10 MG/ML INJECTION: 10 mg/mL | INTRAMUSCULAR | Qty: 1

## 2015-10-29 MED FILL — MIDAZOLAM 1 MG/ML IJ SOLN: 1 mg/mL | INTRAMUSCULAR | Qty: 5

## 2015-10-29 MED FILL — SODIUM CHLORIDE 0.9 % IV: INTRAVENOUS | Qty: 1000

## 2015-10-29 MED FILL — FENTANYL CITRATE (PF) 50 MCG/ML IJ SOLN: 50 mcg/mL | INTRAMUSCULAR | Qty: 2

## 2015-10-29 MED FILL — LACTATED RINGERS IV: INTRAVENOUS | Qty: 1000

## 2015-10-29 MED FILL — HYDROXYZINE 10 MG TAB: 10 mg | ORAL | Qty: 1

## 2015-10-29 MED FILL — DEXTROSE 5%-LACTATED RINGERS IV: INTRAVENOUS | Qty: 1000

## 2015-10-29 MED FILL — MORPHINE 10 MG/ML SYRINGE: 10 mg/mL | INTRAMUSCULAR | Qty: 1

## 2015-10-29 MED FILL — EXPAREL (PF) 1.3 % (13.3 MG/ML) SUSPENSION FOR LOCAL INFILTRATION: 1.3 % (13.3 mg/mL) | Qty: 20

## 2015-10-29 MED FILL — DURAMORPH (PF) 0.5 MG/ML INJECTION SOLUTION: 0.5 mg/mL | INTRAMUSCULAR | Qty: 10

## 2015-10-29 MED FILL — DIPRIVAN 10 MG/ML INTRAVENOUS EMULSION: 10 mg/mL | INTRAVENOUS | Qty: 396.65

## 2015-10-29 MED FILL — BACITRACIN 50,000 UNIT IM: 50000 unit | INTRAMUSCULAR | Qty: 50000

## 2015-10-29 MED FILL — XARELTO 10 MG TABLET: 10 mg | ORAL | Qty: 1

## 2015-10-29 MED FILL — ONDANSETRON (PF) 4 MG/2 ML INJECTION: 4 mg/2 mL | INTRAMUSCULAR | Qty: 2

## 2015-10-29 MED FILL — PHENYLEPHRINE IN 0.9 % SODIUM CL (40 MCG/ML) IV SYRINGE: 0.4 mg/10 mL (40 mcg/mL) | INTRAVENOUS | Qty: 6

## 2015-10-29 MED FILL — TRANEXAMIC ACID 1,000 MG/10 ML (100 MG/ML) IV: 1000 mg/10 mL (100 mg/mL) | INTRAVENOUS | Qty: 10

## 2015-10-29 NOTE — Anesthesia Procedure Notes (Signed)
Peripheral Block    Start time: 10/29/2015 8:30 AM  End time: 10/29/2015 8:40 AM  Performed by: Marya FossaFAUSS, Rilley Stash G  Authorized by: Marya FossaFAUSS, Kisean Rollo G       Pre-procedure:   Indications: at surgeon's request and post-op pain management    Preanesthetic Checklist: patient identified, risks and benefits discussed, site marked, timeout performed and patient being monitored      Block Type:   Block Type:  Adductor canal  Laterality:  Left  Monitoring:  Standard ASA monitoring, continuous pulse ox, frequent vital sign checks, heart rate, responsive to questions and oxygen  Injection Technique:  Single shot  Procedures: ultrasound guided    Patient Position: supine  Prep: chlorhexidine    Needle Type:  Stimuplex  Needle Gauge:  22 G  Needle Localization:  Ultrasound guidance  Medication Injected:  0.5%  ropivacaine  Volume (mL):  20    Assessment:  Number of attempts:  1  Injection Assessment:  Incremental injection every 5 mL, local visualized surrounding nerve on ultrasound, negative aspiration for blood, no paresthesia and no intravascular symptoms  Patient tolerance:  Patient tolerated the procedure well with no immediate complications

## 2015-10-29 NOTE — Anesthesia Procedure Notes (Signed)
Spinal Block    Start time: 10/29/2015 10:40 AM  End time: 10/29/2015 10:45 AM  Performed by: Marya FossaFAUSS, Anberlin Diez G  Authorized by: Marya FossaFAUSS, Tramon Crescenzo G     Pre-procedure:  Indications: primary anesthetic  Preanesthetic Checklist: risks and benefits discussed and timeout performed      Spinal Block:   Patient Position:  Seated    Prep: Betadine      Location:  L3-4  Technique:  Single shot  Local:  Lidocaine 1%  Local Dose (mL):  3    Needle:   Needle Type:  Pencil-tip  Needle Gauge:  25 G  Attempts:  1      Events: CSF confirmed, no blood with aspiration and no paresthesia        Assessment:  Insertion:  Uncomplicated  Patient tolerance:  Patient tolerated the procedure well with no immediate complications

## 2015-10-29 NOTE — Anesthesia Pre-Procedure Evaluation (Signed)
Formatting of this note is different from the original.  Anesthetic History   No history of anesthetic complications        Review of Systems / Medical History  Patient summary reviewed, nursing notes reviewed and pertinent labs reviewed    Pulmonary  Within defined limits     Neuro/Psych   Within defined limits     Cardiovascular  Within defined limits    Comments: Echo mild mr   GI/Hepatic/Renal  Within defined limits      Endo/Other    Anemia     Other Findings       Physical Exam    Airway  Mallampati: II  TM Distance: > 6 cm  Neck ROM: normal range of motion   Mouth opening: Normal     Cardiovascular  Regular rate and rhythm,  S1 and S2 normal,  no murmur, click, rub, or gallop     Dental  No notable dental hx      Pulmonary  Breath sounds clear to auscultation     Abdominal  GI exam deferred     Other Findings       Anesthetic Plan    ASA: 1  Anesthesia type: spinal    Induction: Intravenous  Anesthetic plan and risks discussed with: Patient        Electronically signed by Marya Fossa, MD at 10/29/2015  8:10 AM EDT

## 2015-10-29 NOTE — Op Note (Signed)
[place] [facility]   [address]   [place], [place] [address]   OP NOTE       Name:  NANETTE, WIRSING   MR#:  [id number]   DOB:  [birth year]   Account #:  [id number]    Surgery Date:  10/29/2015   Date of Adm:  10/29/2015       PREOPERATIVE DIAGNOSIS:  Advanced degenerative arthritis,   bilateral knees.      POSTOPERATIVE DIAGNOSIS:  Advanced degenerative arthritis,   bilateral knees.      PROCEDURES PERFORMED:  Left total knee replacement.    ESTIMATED BLOOD LOSS: 200 mL.    SPECIMENS REMOVED: Tibial and femoral bone fragments.    ANESTHESIA:  Spinal.    DRAINS: None.     COMPLICATIONS: None.     ASSISTANT: Marcy Siren, PA-C    INDICATIONS: The patient has advanced degenerative arthritis of   bilateral knees. I followed her for a long period of time and she has   exhausted conservative treatment. She now presents for left total knee   replacement.     PROCEDURE: On the day of operation, the patient was taken to the   operating room, spinal anesthetic administered. The patient was placed   in the supine position. The left lower extremity was prepped and draped   in the usual fashion. After exsanguination with an Esmarch, tourniquet   inflated to 75 mmHg. A midline longitudinal incision was made over the   knee. It was carried through subcutaneous tissue. a medial   parapatellar capsular incision made. Advanced degenerative changes   were noted throughout the knee. The knee was debrided of   osteophytes and soft tissue. A drill was used to gain access to the   femoral canal. Distal femoral cutting guide was assembled with a 5-  degree valgus cut. A distal femoral cut was made with an oscillating   saw. Femur was sized and felt to be a #5 narrow in the Attune system.   Anterior and posterior cuts were made, chamfer cuts were made and   the box cut for the posterior stabilized prosthesis made. External tibial   alignment guide assembled. Tibial plateau cut was made with   an oscillating saw. Flexion  and extension gaps were evaluated and felt   to be appropriate. The tibia was sized and felt to be a #3 in the Attune   system. A drill and punch were used to prepare the tibial plateau. The   patella was prepared with an oscillating saw and sized for 35 mm. Trial   components were put into place, 6 mm insert gave the best fit, range of   motion, with proper alignment and proper tracking of the patella. The   ligaments were felt to be properly balanced. Trial components were   then removed. The knee was thoroughly irrigated with pulse irrigation   as well as antibiotic solution. The components were cemented into place.   The 6 mm trial was put into place. The soft tissues were infiltrated   with Exparel and local anesthetic. After the cement had matured, the 6   mm polyethylene liner was put into place. Tourniquet released,   coagulation achieved with electrocautery, capsule repaired with #1   Vicryl suture supplemented with #2 PDS, 2-0 Vicryl was used to close   subcutaneous tissue and staples used to close the skin. Sterile   dressings were applied. The patient was taken to the recovery room  in   satisfactory condition.     Assistant Marcy SirenWilliam Shea Kernodle, PA-C, who assisted me with   positioning, retraction, implant installation and incision closure.        Tillman Kazmierski W. Glendon AxeGARNETT, MD      Rosanna RandyLWG / JRH   D:  10/29/2015   15:01   T:  10/29/2015   16:44   Job #:  [id number]

## 2015-10-29 NOTE — Anesthesia Procedure Notes (Signed)
Associated Order(s): ANESTHESIA PERIPHERAL BLOCK  Formatting of this note might be different from the original.  Peripheral Block    Start time: 10/29/2015 8:30 AM  End time: 10/29/2015 8:40 AM  Performed by: Marya Fossa  Authorized by: Marya Fossa     Pre-procedure:   Indications: at surgeon's request and post-op pain management    Preanesthetic Checklist: patient identified, risks and benefits discussed, site marked, timeout performed and patient being monitored      Block Type:   Block Type:  Adductor canal  Laterality:  Left  Monitoring:  Standard ASA monitoring, continuous pulse ox, frequent vital sign checks, heart rate, responsive to questions and oxygen  Injection Technique:  Single shot  Procedures: ultrasound guided    Patient Position: supine  Prep: chlorhexidine    Needle Type:  Stimuplex  Needle Gauge:  22 G  Needle Localization:  Ultrasound guidance  Medication Injected:  0.5%  ropivacaine  Volume (mL):  20    Assessment:  Number of attempts:  1  Injection Assessment:  Incremental injection every 5 mL, local visualized surrounding nerve on ultrasound, negative aspiration for blood, no paresthesia and no intravascular symptoms  Patient tolerance:  Patient tolerated the procedure well with no immediate complications      Electronically signed by Marya Fossa, MD at 10/29/2015  8:57 AM EDT

## 2015-10-29 NOTE — Anesthesia Post-Procedure Evaluation (Signed)
Formatting of this note is different from the original.  Post-Anesthesia Evaluation and Assessment    Patient: Lindsay [name] MRN: [id number]  SSN: [id number]    Date of Birth: 1956/03/02  Age: 60 y.o.  Sex: female      Cardiovascular Function/Vital Signs  Visit Vitals   ? BP 95/65   ? Pulse 71   ? Temp 36.6 C (97.9 F)   ? Resp 11   ? Ht 5\' 3"  (1.6 m)   ? Wt 70.3 kg (155 lb)   ? SpO2 90%   ? BMI 27.46 kg/m2     Patient is status post spinal anesthesia for Procedure(s):  LEFT TOTAL KNEE REPLACEMENT.    Nausea/Vomiting: None    Postoperative hydration reviewed and adequate.    Pain:  Pain Scale 1: Numeric (0 - 10) (10/29/15 1340)  Pain Intensity 1: 0 (10/29/15 1340)   Managed    Neurological Status:   Neuro (WDL): Within Defined Limits (10/29/15 1340)  Neuro  Neurologic State: Alert (10/29/15 1340)  Orientation Level: Oriented X4 (10/29/15 1340)  Speech: Clear (10/29/15 1340)  LUE Motor Response: Purposeful (10/29/15 1340)  LLE Motor Response: Purposeful (10/29/15 1340)  RUE Motor Response: Purposeful (10/29/15 1340)  RLE Motor Response: Purposeful (10/29/15 1340)   At baseline    Mental Status and Level of Consciousness: Alert and oriented     Pulmonary Status:   O2 Device: Nasal cannula (10/29/15 1340)   Adequate oxygenation and airway patent    Complications related to anesthesia: None    Post-anesthesia assessment completed. No concerns    Signed By: Nunzio Cobbs, DO     October 29, 2015          Electronically signed by Nunzio Cobbs, DO at 10/29/2015  2:19 PM EDT

## 2015-10-29 NOTE — Anesthesia Procedure Notes (Signed)
Associated Order(s): ANESTHESIA SPINAL BLOCK  Formatting of this note might be different from the original.  Spinal Block    Start time: 10/29/2015 10:40 AM  End time: 10/29/2015 10:45 AM  Performed by: Marya Fossa  Authorized by: Marya Fossa     Pre-procedure:  Indications: primary anesthetic  Preanesthetic Checklist: risks and benefits discussed and timeout performed      Spinal Block:   Patient Position:  Seated    Prep: Betadine      Location:  L3-4  Technique:  Single shot  Local:  Lidocaine 1%  Local Dose (mL):  3    Needle:   Needle Type:  Pencil-tip  Needle Gauge:  25 G  Attempts:  1    Events: CSF confirmed, no blood with aspiration and no paresthesia      Assessment:  Insertion:  Uncomplicated  Patient tolerance:  Patient tolerated the procedure well with no immediate complications      Electronically signed by Marya Fossa, MD at 10/29/2015 10:57 AM EDT

## 2015-10-29 NOTE — Anesthesia Pre-Procedure Evaluation (Addendum)
Anesthetic History   No history of anesthetic complications            Review of Systems / Medical History  Patient summary reviewed, nursing notes reviewed and pertinent labs reviewed    Pulmonary  Within defined limits                 Neuro/Psych   Within defined limits           Cardiovascular  Within defined limits                  Comments: Echo mild mr   GI/Hepatic/Renal  Within defined limits              Endo/Other        Anemia     Other Findings            Physical Exam    Airway  Mallampati: II  TM Distance: > 6 cm  Neck ROM: normal range of motion   Mouth opening: Normal     Cardiovascular  Regular rate and rhythm,  S1 and S2 normal,  no murmur, click, rub, or gallop             Dental  No notable dental hx       Pulmonary  Breath sounds clear to auscultation               Abdominal  GI exam deferred       Other Findings            Anesthetic Plan    ASA: 1  Anesthesia type: spinal          Induction: Intravenous  Anesthetic plan and risks discussed with: Patient

## 2015-10-29 NOTE — Op Note (Signed)
[facility]   [address]   [place], [place] [id number]   OP NOTE       Name:  Lindsay BillingsRKER, Lindsay [name]   MR#:  [id number]   DOB:  1955-09-29   Account #:  [id number]    Surgery Date:  10/29/2015   Date of Adm:  10/29/2015       PREOPERATIVE DIAGNOSIS:  Advanced degenerative arthritis,   bilateral knees.      POSTOPERATIVE DIAGNOSIS:  Advanced degenerative arthritis,   bilateral knees.      PROCEDURES PERFORMED:  Left total knee replacement.    ESTIMATED BLOOD LOSS: 200 mL.    SPECIMENS REMOVED: Tibial and femoral bone fragments.    ANESTHESIA:  Spinal.    DRAINS: None.     COMPLICATIONS: None.     ASSISTANT: Marcy SirenWilliam Shea Kernodle, PA-C    INDICATIONS: The patient has advanced degenerative arthritis of   bilateral knees. I followed her for [name] long period of time and she has   exhausted conservative treatment. She now presents for left total knee   replacement.     PROCEDURE: On the day of operation, the patient was taken to the   operating room, spinal anesthetic administered. The patient was placed   in the supine position. The left lower extremity was prepped and draped   in the usual fashion. After exsanguination with an Esmarch, tourniquet   inflated to 75 mmHg. [name] midline longitudinal incision was made over the   knee. It was carried through subcutaneous tissue. [name] medial   parapatellar capsular incision made. Advanced degenerative changes   were noted throughout the knee. The knee was debrided of   osteophytes and soft tissue. [name] drill was used to gain access to the   femoral canal. Distal femoral cutting guide was assembled with [name] 5-  degree valgus cut. [name] distal femoral cut was made with an oscillating   saw. Femur was sized and felt to be [name] #5 narrow in the Attune system.   Anterior and posterior cuts were made, chamfer cuts were made and   the box cut for the posterior stabilized prosthesis made. External tibial   alignment guide assembled. Tibial plateau cut was made with    an oscillating saw. Flexion and extension gaps were evaluated and felt   to be appropriate. The tibia was sized and felt to be [name] #3 in the Attune   system. [name] drill and punch were used to prepare the tibial plateau. The   patella was prepared with an oscillating saw and sized for 35 mm. Trial   components were put into place, 6 mm insert gave the best fit, range of   motion, with proper alignment and proper tracking of the patella. The   ligaments were felt to be properly balanced. Trial components were   then removed. The knee was thoroughly irrigated with pulse irrigation   as well as antibiotic solution. The components were cemented into place.   The 6 mm trial was put into place. The soft tissues were infiltrated   with Exparel and local anesthetic. After the cement had matured, the 6   mm polyethylene liner was put into place. Tourniquet released,   coagulation achieved with electrocautery, capsule repaired with #1   Vicryl suture supplemented with #2 PDS, 2-0 Vicryl was used to close   subcutaneous tissue and staples used to close the skin. Sterile   dressings were applied. The patient was taken to the recovery room  in   satisfactory condition.     Assistant Marcy SirenWilliam Shea Kernodle, PA-C, who assisted me with   positioning, retraction, implant installation and incision closure.        Tillman Kazmierski W. Glendon AxeGARNETT, MD      Rosanna RandyLWG / JRH   D:  10/29/2015   15:01   T:  10/29/2015   16:44   Job #:  [id number]

## 2015-10-29 NOTE — Anesthesia Post-Procedure Evaluation (Signed)
Post-Anesthesia Evaluation and Assessment    Patient: Lindsay BillingsKathy A [name] MRN: [id number]  SSN: [id number]    Date of Birth: 09/14/1955  Age: 60 y.o.  Sex: female       Cardiovascular Function/Vital Signs  Visit Vitals   ??? BP 95/65   ??? Pulse 71   ??? Temp 36.6 ??C (97.9 ??F)   ??? Resp 11   ??? Ht 5\' 3"  (1.6 m)   ??? Wt 70.3 kg (155 lb)   ??? SpO2 90%   ??? BMI 27.46 kg/m2       Patient is status post spinal anesthesia for Procedure(s):  LEFT TOTAL KNEE REPLACEMENT.    Nausea/Vomiting: None    Postoperative hydration reviewed and adequate.    Pain:  Pain Scale 1: Numeric (0 - 10) (10/29/15 1340)  Pain Intensity 1: 0 (10/29/15 1340)   Managed    Neurological Status:   Neuro (WDL): Within Defined Limits (10/29/15 1340)  Neuro  Neurologic State: Alert (10/29/15 1340)  Orientation Level: Oriented X4 (10/29/15 1340)  Speech: Clear (10/29/15 1340)  LUE Motor Response: Purposeful (10/29/15 1340)  LLE Motor Response: Purposeful (10/29/15 1340)  RUE Motor Response: Purposeful (10/29/15 1340)  RLE Motor Response: Purposeful (10/29/15 1340)   At baseline    Mental Status and Level of Consciousness: Alert and oriented     Pulmonary Status:   O2 Device: Nasal cannula (10/29/15 1340)   Adequate oxygenation and airway patent    Complications related to anesthesia: None    Post-anesthesia assessment completed. No concerns    Signed By: Nunzio Cobbshomas R Lamiyah Schlotter, DO     October 29, 2015

## 2015-10-29 NOTE — Other (Signed)
TRANSFER - OUT REPORT:    Verbal report given to [facility] Lindsay [name](name) on Lindsay [name]  being transferred to 567(unit) for routine post - op       Report consisted of patient???s Situation, Background, Assessment and   Recommendations(SBAR).     Time Pre op antibiotic given:1050, Ancef 2 gm  Anesthesia Stop time: 1305  Foley Present on Transfer to floor:y  Order for Foley on Chart:y    Information from the following report(s) SBAR, OR Summary, Intake/Output and MAR was reviewed with the receiving nurse.    Opportunity for questions and clarification was provided.     Is the patient on 02? YES       L/Min 2       Other     Is the patient on a monitor? NO    Is the nurse transporting with the patient? NO    Surgical Waiting Area notified of patient's transfer from PACU? YES, VIA VOLUNTEER CAROL      The following personal items collected during your admission accompanied patient upon transfer:   Dental Appliance: Dental Appliances: None  Vision: Visual Aid: Glasses  Hearing Aid:    Jewelry: Jewelry: None  Clothing: Clothing: Other (comment) (bag of clothes returned to patient)  Other Valuables: Other Valuables:  (pt states no eyeglasses)  Valuables sent to safe:

## 2015-10-29 NOTE — Progress Notes (Signed)
NP ORTHO PROGRESS NOTE  Post Op day: Day of Surgery    October 29, 2015 4:44 PM     Lindsay [name]  [id number]  female  60 y.o.   09/07/1955    Admit date: 10/29/2015  Date of Surgery: 10/29/2015   Procedures: Procedure(s):  LEFT TOTAL KNEE REPLACEMENT  Admitting Physician: Mardi MainlandLockett W [name], MD   Surgeon: Moishe SpiceSurgeon(s) and Role:     Mardi Mainland* Lockett W [name], MD - Primary    Chart/Meds/Labs Reviewed  Current Facility-Administered Medications   Medication Dose Route Frequency   ??? 0.9% sodium chloride infusion  125 mL/hr IntraVENous CONTINUOUS   ??? sodium chloride 0.9 % bolus infusion 500 mL  500 mL IntraVENous ONCE PRN   ??? [START ON 10/30/2015] sodium chloride (NS) flush 5-10 mL  5-10 mL IntraVENous Q8H   ??? sodium chloride (NS) flush 5-10 mL  5-10 mL IntraVENous PRN   ??? acetaminophen (TYLENOL) tablet 650 mg  650 mg Oral Q6H   ??? oxyCODONE IR (ROXICODONE) tablet 5 mg  5 mg Oral Q3H PRN   ??? oxyCODONE IR (ROXICODONE) tablet 10 mg  10 mg Oral Q3H PRN   ??? celecoxib (CELEBREX) capsule 200 mg  200 mg Oral BID   ??? HYDROmorphone (PF) (DILAUDID) injection 0.5 mg  0.5 mg IntraVENous Q4H PRN   ??? naloxone (NARCAN) injection 0.4 mg  0.4 mg IntraVENous PRN   ??? ceFAZolin in 0.9% NS (ANCEF) IVPB soln 2 g  2 g IntraVENous Q8H   ??? [START ON 10/30/2015] dexamethasone (DECADRON) 4 mg/mL injection 10 mg  10 mg IntraVENous ONCE   ??? ondansetron (ZOFRAN) injection 4 mg  4 mg IntraVENous Q4H PRN   ??? hydrOXYzine HCl (ATARAX) tablet 10 mg  10 mg Oral Q8H PRN   ??? famotidine (PEPCID) tablet 20 mg  20 mg Oral BID PRN   ??? rivaroxaban (XARELTO) tablet 10 mg  10 mg Oral DAILY WITH DINNER   ??? sodium chloride (NS) flush 5-10 mL  5-10 mL IntraVENous Q8H   ??? sodium chloride (NS) flush 5-10 mL  5-10 mL IntraVENous PRN   ??? nalbuphine (NUBAIN) injection 2.5 mg  2.5 mg IntraVENous Q1H PRN   ??? [START ON 10/30/2015] senna-docusate (PERICOLACE) 8.6-50 mg per tablet 1 Tab  1 Tab Oral BID   ??? escitalopram oxalate (LEXAPRO) tablet 20 mg  20 mg Oral QHS    ??? rOPINIRole (REQUIP) tablet 1.5 mg  1.5 mg Oral QHS   ??? pantoprazole (PROTONIX) tablet 40 mg  40 mg Oral QHS   ??? [START ON 10/30/2015] diazePAM (VALIUM) tablet 5 mg  5 mg Oral DAILY   ??? diazePAM (VALIUM) tablet 5-10 mg  5-10 mg Oral QHS   ??? [START ON 10/30/2015] polyethylene glycol (MIRALAX) packet 17 g  17 g Oral DAILY PRN       Subjective:    Complaints: None--was anxious before surgery & had loose BM yesterday & this morning.  Denies Dizziness, CP, SOB, N/V, Abdominal pain, numbness or tingling of extremities. Able to perform ankle pumps easily.  Progressing with mobility.  Reviewed PTA medications with patient.  Note long term use of Benzos.  Incisional pain control: well controlled  Pain Control:   Pain Assessment  Pain Scale 1: Numeric (0 - 10)  Pain Intensity 1: 0    Oral diet: Tolerating  Clear diet well.  Ready to advance this evening to regular (avoid fatty foods)    Objective:  General: Alert,Ox4, cooperative, NAD.  Husband at bedside  HEENT: Atraumatic, PERRL, anicteric sclerae  Lungs: Bilateral expansion. Equal excursion. No accessory muscle use.   Gastrointestinal:  Soft, non-tender, non-distended  Extremities:  Neurovasc exam WDL. + DP pulses.           Sensation intact to light touch.          Motor: + DF/PF          Calves non-tender upon palpation or with passive stretch.          No significant erythema or swelling.   Ace-wrapped Dressing: clean, dry and intact.  Ice pack to site  Foley draining clear amber urine qs  NS infusing via IV peripherally  Vital Signs:   Visit Vitals   ??? BP 110/68 (BP 1 Location: Right arm, BP Patient Position: At rest)   ??? Pulse 76   ??? Temp 97.5 ??F (36.4 ??C)   ??? Resp 12   ??? Ht  (1.6 m)   ??? Wt 70.3 kg (155 lb)   ??? SpO2 97%   ??? BMI 27.46 kg/m2    O2 Flow Rate (L/min): 2 l/min O2 Device: Nasal cannula   Patient Vitals for the past 24 hrs:   BP Temp Pulse Resp SpO2 Height Weight   10/29/15 1553 110/68 97.5 ??F (36.4 ??C) 76 12 97 % - -    10/29/15 1440 112/68 97.5 ??F (36.4 ??C) 85 12 98 % - -   10/29/15 1417 95/65 - - - - - -   10/29/15 1415 - - 71 11 - - -   10/29/15 1410 - - 71 18 90 % - -   10/29/15 1405 - - 74 15 91 % - -   10/29/15 1400 117/66 - 73 14 95 % - -   10/29/15 1355 - - 77 12 95 % - -   10/29/15 1350 - - 72 22 95 % - -   10/29/15 1345 108/71 - 71 - 95 % - -   10/29/15 1340 - 97.9 ??F (36.6 ??C) 73 16 95 % - -   10/29/15 1330 103/74 - 75 17 95 % - -   10/29/15 1325 - - 77 21 95 % - -   10/29/15 1320 111/69 - 78 19 95 % - -   10/29/15 1315 105/64 - 80 17 95 % - -   10/29/15 1310 105/67 - 81 12 94 % - -   10/29/15 1306 - - 81 9 92 % - -   10/29/15 1305 99/68 - - - - - -   10/29/15 1304 - 97.8 ??F (36.6 ??C) 83 10 94 % - -   10/29/15 0851 101/69 - 63 20 98 % - -   10/29/15 0845 107/61 - 70 20 97 % - -   10/29/15 0840 101/65 - 67 20 96 % - -   10/29/15 0830 98/62 - 66 20 96 % - -   10/29/15 0756 106/62 98.1 ??F (36.7 ??C) 71 20 96 %  (1.6 m) 70.3 kg (155 lb)     Temp (24hrs), Avg:97.8 ??F (36.6 ??C), Min:97.5 ??F (36.4 ??C), Max:98.1 ??F (36.7 ??C)      Intake/output-last 8 hours:   04/26 0701 - 04/26 1900  In: 1000 [I.V.:1000]  Out: 625 [Urine:575]    Intake/output- 24 hours:       LAB:   Recent Results (from the past 24 hour(s))   GLUCOSE, POC    Collection Time: 10/29/15  8:03 AM   Result Value  Ref Range    Glucose (POC) 107 (H) 65 - 100 mg/dL    Performed by Cecelia Byars       Lab Results   Component Value Date/Time    INR 1.0 10/27/2015 08:43 AM    INR 0.9 02/05/2014 02:59 AM    INR 0.9 12/07/2013 12:34 PM    INR 1.0 12/26/2012 07:45 PM     Lab Results   Component Value Date/Time    HGB 10.4 10/27/2015 08:43 AM    HGB 11.3 11/06/2014 03:35 PM    HGB 9.9 02/05/2014 02:58 AM    HGB 10.7 12/07/2013 12:34 PM     Recent Labs      10/27/15   0843   NA  137   K  4.5   CL  102   BUN  12   CREA  0.86   GLU  102*   CA  8.8       PT/OT:   Gait:  Gait  Base of Support: Narrowed, Shift to right  Speed/Cadence: Slow   Step Length: Right shortened, Left shortened  Stance: Left decreased  Gait Abnormalities: Antalgic, Decreased step clearance  Ambulation - Level of Assistance: Assist x1, Contact guard assistance  Distance (ft): 40 Feet (ft)  Assistive Device: Gait belt, Walker, rolling                 PATIENT MOBILITY  Bed Mobility Training  Supine to Sit: Assist x1, Stand-by asssistance  Sit to Supine: Assist x1, Stand-by asssistance  Scooting: Assist x1, Stand-by asssistance  Transfer Training  Sit to Stand: Assist x1, Contact guard assistance  Stand to Sit: Assist x1, Contact guard assistance      Gait Training  Assistive Device: Gait belt, Walker, rolling  Ambulation - Level of Assistance: Assist x1, Contact guard assistance  Distance (ft): 40 Feet (ft)   Weight Bearing Status  Left Side Weight Bearing: As tolerated        Assessment and Plan    Principal Problem:    Primary osteoarthritis of left knee (10/28/2015)          POD#0Procedure(s):  LEFT TOTAL KNEE REPLACEMENT  Chronic anemia.  VSSAF    VTE prophylaxis: Xarelto (to start this evening) , Mobilization, Ankle pumping exercises, SCDs per order if not able to exercise or mobilize well.   Weight bearing:  WBAT  Pain management: Chronic use of Benzos--careful management. Multi-modal pain plan, see above for medication,  Ice packs & elevation of extremity per orders, active gentle ROM & mobilize frequently for short periods of time. Bowel regimine to start tomorrow (note had loose stools PTA) Monitor  PT/OT  Wound benign. Neurovascularly intact.  Progressing well. No indications of concerns. Continue present plans per orthopedic attending surgeon & interdisciplinary team for joint replacement.    Discharge Planning: Goal is home with home care services pending progress & plans per Dr. Glendon Axe.    Signed By: Nelta Numbers, NP    RN, MSN, MA, Adult NP-BC

## 2015-10-29 NOTE — Other (Addendum)
PACU HANDOFF:    Patient: Lindsay [name] MRN: [id number]  SSN: [id number]   Date of Birth: 12/26/1955  Age: 60 y.o.  Sex: female     Patient is status post Procedure(s):  LEFT TOTAL KNEE REPLACEMENT.    Surgeon(s) and Role:     * Mardi MainlandLockett W [name], MD - Primary    Local/Dose/Irrigation: LOCAL MIX: 10 MG MORPHINE, 20 ML NACL, 20 ML EXPAREL, AND 30 ML 0.5% BUPIVACAINE PLAIN  1G TRANEXAMIC ACID MIXED WITH 10 ML NACL--APPLIED TOPICALLY                Peripheral IV 10/29/15 Left Hand (Active)   Site Assessment Clean, dry, & intact 10/29/2015  8:07 AM   Phlebitis Assessment 0 10/29/2015  8:07 AM   Infiltration Assessment 0 10/29/2015  8:07 AM   Dressing Status Clean, dry, & intact;New 10/29/2015  8:07 AM   Dressing Type Tape;Transparent 10/29/2015  8:07 AM   Hub Color/Line Status Pink 10/29/2015  8:07 AM                     Dressing/Packing:  Wound Knee Left;Anterior-DRESSING TYPE: Cast padding;Elastic bandage;Silver products;Staples (AQUACEL) (10/29/15 1100)  Splint/Cast:  ]    Other: 16 FR FOLEY CATHETER, TED HOSE AND SCD ON RIGHT LEG

## 2015-10-29 NOTE — Progress Notes (Signed)
Problem: Mobility Impaired (Adult and Pediatric)  Goal: *Acute Goals and Plan of Care (Insert Text)  Physical Therapy Goals  Initiated 10/29/2015    1. Patient will move from supine to sit and sit to supine, scoot up and down and roll side to side in bed with independence within 4 days.  2. Patient will perform sit to stand with modified independence within 4 days.  3. Patient will ambulate with modified independence for 150 feet with the least restrictive device within 4 days.  4. Patient will ascend/descend 6 stairs with handrail(s) per home set-up with supervision/set-up within 4 days.  5. Patient will perform home exercise program per protocol with modified independence within 4 days.  6. Patient will demonstrate AROM 0-90 degrees in operative joint within 4 days.  PHYSICAL THERAPY EVALUATION  Patient: Lindsay [name] (60 y.o. female)  Date: 10/29/2015  Primary Diagnosis: DJD LEFT KNEE  Primary osteoarthritis of left knee  Procedure(s) (LRB):  LEFT TOTAL KNEE REPLACEMENT (Left) Day of Surgery   Precautions: Fall, WBAT      ASSESSMENT :  Based on the objective data described below, the patient presents with balance impairments, gait deficits, pain and overall decreased independence with functional activities after left TKR. Pt reports attending pre-op class prior to surgery. RN cleared pt for skilled therapy intervention. Pt reports 1/10 pain at this time. VSS with all positional changes. Pt required standby-assist for bed mobility this afternoon. Pt was able to stand with CGA and use of RW and verbal cues for safety. Pt gait trained x 40 ft with CGA. Gait pattern step-to, antalgic, and slow. Pt returned to sitting and then supine in bed with stand-by assist (linked her R LE under her L LE in order to self-manage into bed). Pt then completed supine exercises with verbal cues (ankle pumps, quad sets, gluteal sets, and heel slides). Pt has no further questions at this time.  Pt will need a RW ordered for discharge. Recommend HHPT at discharge.      Patient will benefit from skilled intervention to address the above impairments.  Patient???s rehabilitation potential is considered to be Good  Factors which may influence rehabilitation potential include:   [X]          None noted  [ ]          Mental ability/status  [ ]          Medical condition  [ ]          Home/family situation and support systems  [ ]          Safety awareness  [ ]          Pain tolerance/management  [ ]          Other:        PLAN :  Recommendations and Planned Interventions:  [X]            Bed Mobility Training             [ ]     Neuromuscular Re-Education  [X]            Transfer Training                   [ ]     Orthotic/Prosthetic Training  [X]            Gait Training                         [ ]     Modalities  [X]   Therapeutic Exercises               Edema Management/Control             Therapeutic Activities                Patient and Family Training/Education             Other (comment):     Frequency/Duration: Patient will be followed by physical therapy  twice daily to address goals.  Discharge Recommendations: Home Health  Further Equipment Recommendations for Discharge: Rolling walker (To order tomorrow)        SUBJECTIVE:   Patient stated ???I am so glad this went well .???      OBJECTIVE DATA SUMMARY:   HISTORY:    Past Medical History:   Diagnosis Date   ??? Abnormal brain scan     ??? Arthritis     ??? Chronic pain     ??? Depression     ??? Falls frequently     ??? Fracture of left ankle     ??? Headache 2011   ??? Ill-defined condition 09/15/2015     FX R HAND   ??? Other and unspecified hyperlipidemia 12/27/2012   ??? PE (pulmonary embolism)     ??? Torn rotator cuff       right     Past Surgical History:   Procedure Laterality Date   ??? BREAST SURGERY PROCEDURE UNLISTED   2002     AUGMENTATION   ??? HX APPENDECTOMY       ??? HX CESAREAN SECTION         x4   ??? HX GI         COLONOSCOPY   ??? HX ORTHOPAEDIC          rotater cuff r. shoulder   ??? HX ORTHOPAEDIC         left ankle fracture treated with air cast   ??? HX ORTHOPAEDIC   6/13     left shoulder fxt and dislocation   ??? HX ORTHOPAEDIC         TORN TENDON L ANKLE    ??? HX OTHER SURGICAL   2010     left foot torn tendon   ??? HX OTHER SURGICAL       ??? HX TUBAL LIGATION         Prior Level of Function/Home Situation: Lives with husband. Independent functional mobility (pain-limiting however). Denies recent falls.   Personal factors and/or comorbidities impacting plan of care: Chronic pain, arthritis      Home Situation  Home Environment: Private residence  # Steps to Enter: 5  Rails to Enter: Yes  Hand Rails : Bilateral (> arm's width apart)  One/Two Story Residence:  (3 stories (includes basement))  # of Interior Steps: 14  Living Alone: No  Support Systems: Games developer  Patient Expects to be Discharged to:: Private residence  Current DME Used/Available at Home: Grab bars, Crutches     EXAMINATION/PRESENTATION/DECISION MAKING:   Critical Behavior:  Neurologic State: Alert  Orientation Level: Oriented X4  Cognition: Appropriate decision making, Appropriate for age attention/concentration, Appropriate safety awareness, Follows commands  Safety/Judgement: Awareness of environment, Fall prevention, Insight into deficits   Skin:  Ace bandage covering knee incision. C/d/i  Range Of Motion:  AROM: Within functional limits  Strength:    Strength: Within functional limits  Tone & Sensation:   Tone: Normal  Sensation: Intact  Coordination:  Coordination: Within  functional limits      Functional Mobility:  Bed Mobility:  Supine to Sit: Assist x1;Stand-by asssistance  Sit to Supine: Assist x1;Stand-by asssistance  Scooting: Assist x1;Stand-by asssistance  Transfers:  Sit to Stand: Assist x1;Contact guard assistance  Stand to Sit: Assist x1;Contact guard assistance  Balance:   Sitting: Intact;Without support  Standing: With support;Intact  Ambulation/Gait Training:   Distance (ft): 40 Feet (ft)  Assistive Device: Gait belt;Walker, rolling  Ambulation - Level of Assistance: Assist x1;Contact guard assistance  Gait Description (WDL): Exceptions to WDL  Gait Abnormalities: Antalgic;Decreased step clearance  Left Side Weight Bearing: As tolerated  Base of Support: Narrowed;Shift to right  Stance: Left decreased  Speed/Cadence: Slow  Step Length: Right shortened;Left shortened     Functional Measure:  Timed up and go:      Timed Get Up And Go Test: 16      Timed Up and Go and G-code impairment scale:  Percentage of Impairment CH     0%    CI     1-19% CJ     20-39% CK     40-59% CL     60-79% CM     80-99% CN      100%   Timed   Score 0-56 10 11-12 13-14 15-16 17-18 19 20           < than 10 seconds=Normal  Greater then 13.5 seconds (in elderly)=Increased fall risk   Shumway-Cook A, Johnnette Gourd, [place] M. Predicting the probability for falls in community dwelling older adults using the Timed Up and Go Test. Phys Ther. 2000;80:896-903.         G codes:  In compliance with CMS???s Claims Based Outcome Reporting, the following G-code set was chosen for this patient based on their primary functional limitation being treated:     The outcome measure chosen to determine the severity of the functional limitation was the TUG with a score of 16 sec which was correlated with the impairment scale. - Pt requires use of adaptive equipment (RW) for safety and weight-bearing status s/p L TKR.      ?? Mobility - Walking and Moving Around:              [phone] - CURRENT STATUS:    CK - 40%-59% impaired, limited or restricted              [id number] - GOAL STATUS:           CJ - 20%-39% impaired, limited or restricted              U0454 - D/C STATUS:                       ---------------To be determined---------------      Physical Therapy Evaluation Charge Determination   History Examination Presentation Decision-Making   MEDIUM  Complexity : 1-2 comorbidities / personal factors will impact the  outcome/ POC  MEDIUM Complexity : 3 Standardized tests and measures addressing body structure, function, activity limitation and / or participation in recreation  LOW Complexity : Stable, uncomplicated  Other outcome measures TUG (16 sec) MEDIUM      Based on the above components, the patient evaluation is determined to be of the following complexity level: LOW      Pain:  Pain Scale 1: Numeric (0 - 10)  Pain Intensity 1: 0     Activity Tolerance:   Please refer to the flowsheet for vital  signs taken during this treatment.  After treatment:   [ ]          Patient left in no apparent distress sitting up in chair  [X]          Patient left in no apparent distress in bed  [X]          Call bell left within reach  [X]          Nursing notified  [ ]          Caregiver present  [ ]          Bed alarm activated      COMMUNICATION/EDUCATION:   The patient???s plan of care was discussed with: Registered Nurse.  [X]          Fall prevention education was provided and the patient/caregiver indicated understanding.  [X]          Patient/family have participated as able in goal setting and plan of care.  [X]          Patient/family agree to work toward stated goals and plan of care.  [ ]          Patient understands intent and goals of therapy, but is neutral about his/her participation.  [ ]          Patient is unable to participate in goal setting and plan of care.     Thank you for this referral.  Annitta JerseyKaylea A. Kirven PT, DPT   Time Calculation: 26 mins

## 2015-10-29 NOTE — Anesthesia Procedure Notes (Signed)
Peripheral Block    Start time: 10/29/2015 8:30 AM  End time: 10/29/2015 8:40 AM  Performed by: Breianna Delfino G  Authorized by: Regino Fournet G       Pre-procedure:   Indications: at surgeon's request and post-op pain management    Preanesthetic Checklist: patient identified, risks and benefits discussed, site marked, timeout performed and patient being monitored      Block Type:   Block Type:  Adductor canal  Laterality:  Left  Monitoring:  Standard ASA monitoring, continuous pulse ox, frequent vital sign checks, heart rate, responsive to questions and oxygen  Injection Technique:  Single shot  Procedures: ultrasound guided    Patient Position: supine  Prep: chlorhexidine    Needle Type:  Stimuplex  Needle Gauge:  22 G  Needle Localization:  Ultrasound guidance  Medication Injected:  0.5%  ropivacaine  Volume (mL):  20    Assessment:  Number of attempts:  1  Injection Assessment:  Incremental injection every 5 mL, local visualized surrounding nerve on ultrasound, negative aspiration for blood, no paresthesia and no intravascular symptoms  Patient tolerance:  Patient tolerated the procedure well with no immediate complications

## 2015-10-29 NOTE — Progress Notes (Signed)
TRANSFER - IN REPORT:    Verbal report received from Hollis(name) on Rosalene BillingsKathy A Crute  being received from [facility] for routine post - op      Report consisted of patient???s Situation, Background, Assessment and   Recommendations(SBAR).     Information from the following report(s) SBAR, Kardex, Intake/Output, MAR and Recent Results was reviewed with the receiving nurse.    Opportunity for questions and clarification was provided.      Assessment completed upon patient???s arrival to unit and care assumed.

## 2015-10-29 NOTE — Other (Signed)
MESSAGE LEFT WITH THE SURGICAL WAITING ROOM RECEPTIONIST TO INFORM FAMILY OF THE SURGERY START TIME AND PATIENT'S WELL-BEING--CALLED AT 1119 BY Carlyon ProwsJ. OOMS, RN.

## 2015-10-29 NOTE — Anesthesia Procedure Notes (Signed)
Spinal Block    Start time: 10/29/2015 10:40 AM  End time: 10/29/2015 10:45 AM  Performed by: Taysen Bushart G  Authorized by: Aivy Akter G     Pre-procedure:  Indications: primary anesthetic  Preanesthetic Checklist: risks and benefits discussed and timeout performed      Spinal Block:   Patient Position:  Seated    Prep: Betadine      Location:  L3-4  Technique:  Single shot  Local:  Lidocaine 1%  Local Dose (mL):  3    Needle:   Needle Type:  Pencil-tip  Needle Gauge:  25 G  Attempts:  1      Events: CSF confirmed, no blood with aspiration and no paresthesia        Assessment:  Insertion:  Uncomplicated  Patient tolerance:  Patient tolerated the procedure well with no immediate complications

## 2015-10-29 NOTE — Progress Notes (Signed)
Bedside and Verbal shift change report given to Vernona RiegerLaura (Cabin crewoncoming nurse) by Gerlean RenJacqueline & Liz (offgoing nurse). Report included the following information SBAR.

## 2015-10-29 NOTE — Brief Op Note (Signed)
BRIEF OPERATIVE NOTE    Date of Procedure: 10/29/2015   Preoperative Diagnosis: DJD LEFT KNEE  Postoperative Diagnosis: DJD LEFT KNEE    Procedure(s):  LEFT TOTAL KNEE REPLACEMENT  Surgeon(s) and Role:     * Mardi MainlandLockett W [name], MD - Primary         Assistant Staff:  Physician Assistant: Asher MuirWilliam S Shon Mansouri, PA-C    Surgical Staff:  Circ-1: Nicholes StairsJessica G Ooms, RN  Circ-Relief: Sherryl MangesAshley E Boswell, RN  Physician Assistant: Asher MuirWilliam S Teala Daffron, PA-C  Scrub Tech-1: Meagan Seay  Scrub Tech-Relief: Mliss Fritzavid K Cartwright  Surg Asst-1: Les Pouobert S Hulings  Event Time In   Incision Start 1114   Incision Close 1254     Anesthesia: Spinal   Estimated Blood Loss: 200 L  Specimens:   ID Type Source Tests Collected by Time Destination   1 : LEFT KNEE BONE AND TISSUE    Mardi MainlandLockett W [name], MD 10/29/2015 1138 Discarded      Findings: DJD Left knee   Complications: None  Implants:   Implant Name Type Inv. Item Serial No. Manufacturer Lot No. LRB No. Used Action   CEMENT BNE SMARTSET HV 40GM -- SMARTSET - SN/A  CEMENT BNE SMARTSET HV 40GM -- SMARTSET N/A [employer] [id number] Left 2 Implanted   FEM PS NAR CEM LT SZ 5 -- ATTUNE - SNA  FEM PS NAR CEM LT SZ 5 -- ATTUNE NA [employer] [id number] Left 1 Implanted   BASE TIB FB SZ 3 CEM -- ATTUNE - SNA  BASE TIB FB SZ 3 CEM -- ATTUNE NA [employer] [id number] Left 1 Implanted   PAT CEM DOME MEDIAL 35MM -- ATTUNE - SNA  PAT CEM DOME MEDIAL 35MM -- ATTUNE NA [employer] [id number] Left 1 Implanted   INSERT TIB FB PS SZ 5 6MM -- ATTUNE - SN/A   INSERT TIB FB PS SZ 5 6MM -- ATTUNE N/A [employer] [id number] Left 1 Implanted

## 2015-10-30 LAB — METABOLIC PANEL, BASIC
Anion gap: 8 mmol/L (ref 5–15)
BUN/Creatinine ratio: 12 (ref 12–20)
BUN: 9 MG/DL (ref 6–20)
CO2: 28 mmol/L (ref 21–32)
Calcium: 8.2 MG/DL — ABNORMAL LOW (ref 8.5–10.1)
Chloride: 103 mmol/L (ref 97–108)
Creatinine: 0.75 MG/DL (ref 0.55–1.02)
GFR est AA: >60 mL/min/{1.73_m2} (ref >60)
GFR est non-AA: >60 mL/min/{1.73_m2} (ref >60)
Glucose: 129 mg/dL — ABNORMAL HIGH (ref 65–100)
Potassium: 4.1 mmol/L (ref 3.5–5.1)
Sodium: 139 mmol/L (ref 136–145)

## 2015-10-30 LAB — HEMOGLOBIN: HGB: 8.2 g/dL — ABNORMAL LOW (ref 11.5–16.0)

## 2015-10-30 MED ORDER — SODIUM CHLORIDE 0.9 % IV
INTRAVENOUS | Status: AC
Start: 2015-10-30 — End: 2015-10-30
  Administered 2015-10-30: 16:00:00 via INTRAVENOUS

## 2015-10-30 MED ORDER — RIVAROXABAN 10 MG TAB
10 mg | ORAL_TABLET | Freq: Every day | ORAL | 0 refills | Status: DC
Start: 2015-10-30 — End: 2015-12-10

## 2015-10-30 MED ORDER — OXYCODONE 5 MG TAB
5 mg | ORAL_TABLET | ORAL | 0 refills | Status: DC | PRN
Start: 2015-10-30 — End: 2015-12-10

## 2015-10-30 MED FILL — CEFAZOLIN 2 GRAM/50 ML NS IVPB: INTRAVENOUS | Qty: 50

## 2015-10-30 MED FILL — BD POSIFLUSH NORMAL SALINE 0.9 % INJECTION SYRINGE: INTRAMUSCULAR | Qty: 10

## 2015-10-30 MED FILL — CELEBREX 200 MG CAPSULE: 200 mg | ORAL | Qty: 1

## 2015-10-30 MED FILL — OXYCODONE 5 MG TAB: 5 mg | ORAL | Qty: 1

## 2015-10-30 MED FILL — SENNA PLUS 8.6 MG-50 MG TABLET: ORAL | Qty: 1

## 2015-10-30 MED FILL — DEXAMETHASONE SODIUM PHOSPHATE 4 MG/ML IJ SOLN: 4 mg/mL | INTRAMUSCULAR | Qty: 3

## 2015-10-30 MED FILL — ESCITALOPRAM 10 MG TAB: 10 mg | ORAL | Qty: 2

## 2015-10-30 MED FILL — DIAZEPAM 2 MG TAB: 2 mg | ORAL | Qty: 3

## 2015-10-30 MED FILL — ACETAMINOPHEN 325 MG TABLET: 325 mg | ORAL | Qty: 2

## 2015-10-30 MED FILL — SODIUM CHLORIDE 0.9 % IV: INTRAVENOUS | Qty: 1000

## 2015-10-30 MED FILL — PANTOPRAZOLE 40 MG TAB, DELAYED RELEASE: 40 mg | ORAL | Qty: 1

## 2015-10-30 MED FILL — ROPINIROLE 1 MG TAB: 1 mg | ORAL | Qty: 2

## 2015-10-30 MED FILL — XARELTO 10 MG TABLET: 10 mg | ORAL | Qty: 1

## 2015-10-30 NOTE — Progress Notes (Signed)
Problem: Mobility Impaired (Adult and Pediatric)  Goal: *Acute Goals and Plan of Care (Insert Text)  Physical Therapy Goals  Initiated 10/29/2015    1. Patient will move from supine to sit and sit to supine, scoot up and down and roll side to side in bed with independence within 4 days.  2. Patient will perform sit to stand with modified independence within 4 days.  3. Patient will ambulate with modified independence for 150 feet with the least restrictive device within 4 days.  4. Patient will ascend/descend 6 stairs with handrail(s) per home set-up with supervision/set-up within 4 days.  5. Patient will perform home exercise program per protocol with modified independence within 4 days.  6. Patient will demonstrate AROM 0-90 degrees in operative joint within 4 days.   PHYSICAL THERAPY TREATMENT  Patient: Lindsay [name] (60 y.o. female)  Date: 10/30/2015  Diagnosis: DJD LEFT KNEE  Primary osteoarthritis of left knee Primary osteoarthritis of left knee  Procedure(s) (LRB):  LEFT TOTAL KNEE REPLACEMENT (Left) 1 Day Post-Op  Precautions: Fall, WBAT      ASSESSMENT:  Pt cleared by nsg.  Pt able to mobilize to standing with CGA, gaits 200 feet with CGA and RW.  Pt complained of dizziness/lightheadedness once back near room.  BP in sitting once back in room was 89/49 so decision was made to return her to bed.  NP Thedore Mins was also present and increased rate of IV fluids.  Please refer to doc flow sheets for additional vitals; patient was stable in supine at end of session. Plan is for her to return home with HHPT.  Dan Humphreys has been ordered.   Will follow up in pm  Progression toward goals:        Improving appropriately and progressing toward goals        Improving slowly and progressing toward goals        Not making progress toward goals and plan of care will be adjusted       PLAN:  Patient continues to benefit from skilled intervention to address the  above impairments.  Continue treatment per established plan of care.  Discharge Recommendations:  Home Health  Further Equipment Recommendations for Discharge:  walker       SUBJECTIVE:   Patient stated ???I feel so much better thank you. Marland Kitchen???      OBJECTIVE DATA SUMMARY:   Critical Behavior:  Neurologic State: Alert  Orientation Level: Oriented X4  Cognition: Appropriate for age attention/concentration  Safety/Judgement: Good awareness of safety precautions  Range of Motion:  AROM: Within functional limits  PROM: Within functional limits         Functional Mobility Training:  Bed Mobility:     Supine to Sit: Minimum assistance  Sit to Supine: Assist x1;Minimum assistance           Transfers:  Sit to Stand: Assist x1;Minimum assistance  Stand to Sit: Contact guard assistance        Bed to Chair: Contact guard assistance                    Balance:  Sitting: Intact  Standing: Intact;With support  Ambulation/Gait Training:  Distance (ft): 200 Feet (ft)  Assistive Device: Walker, rolling;Gait belt  Ambulation - Level of Assistance: Assist x1;Contact guard assistance        Gait Abnormalities: Antalgic;Decreased step clearance     Left Side Weight Bearing: As tolerated  Base of Support:  (WNL)  Speed/Cadence: Slow  Step Length: Right shortened;Left shortened        Pain:  Pain Scale 1: Numeric (0 - 10)  Pain Intensity 1: 3  Pain Location 1: Knee  Pain Orientation 1: Left  Pain Description 1: Aching  Pain Intervention(s) 1: Medication (see MAR)  Activity Tolerance:   Good, but limited by dizziness  Please refer to the flowsheet for vital signs taken during this treatment.  After treatment:   [ ]  Patient left in no apparent distress sitting up in chair  [X]  Patient left in no apparent distress in bed  [X]  Call bell left within reach  [X]  Nursing notified  [ ]  Caregiver present  [ ]  Bed alarm activated      COMMUNICATION/COLLABORATION:   The patient???s plan of care was discussed with: Occupational Therapist and  Registered Nurse     Meriel FlavorsNancy L Davis, PT   Time Calculation: 33 mins

## 2015-10-30 NOTE — Progress Notes (Signed)
Patient is POD #1,  under spinal with duramorph.    Patient doing relatively well. mild itching. Pain under good control. none nausea and/or vomiting.    Block site reveals normal exam; no erythema, swelling or tenderness.    Continue current postop pain regimen through today.

## 2015-10-30 NOTE — Progress Notes (Signed)
Problem: Mobility Impaired (Adult and Pediatric)  Goal: *Acute Goals and Plan of Care (Insert Text)  Physical Therapy Goals  Initiated 10/29/2015    1. Patient will move from supine to sit and sit to supine, scoot up and down and roll side to side in bed with independence within 4 days.  2. Patient will perform sit to stand with modified independence within 4 days.  3. Patient will ambulate with modified independence for 150 feet with the least restrictive device within 4 days.  4. Patient will ascend/descend 6 stairs with handrail(s) per home set-up with supervision/set-up within 4 days.  5. Patient will perform home exercise program per protocol with modified independence within 4 days.  6. Patient will demonstrate AROM 0-90 degrees in operative joint within 4 days.   PHYSICAL THERAPY TREATMENT  Patient: Lindsay [name] (60 y.o. female)  Date: 10/30/2015  Diagnosis: DJD LEFT KNEE  Primary osteoarthritis of left knee Primary osteoarthritis of left knee  Procedure(s) (LRB):  LEFT TOTAL KNEE REPLACEMENT (Left) 1 Day Post-Op  Precautions: Fall, WBAT      ASSESSMENT:  Pt cleared by nsg.  Pt able to mobilize as BP was stable at 111/66 to 103/61.  Pt mobilized to edge of bed mod Ind.   Pt able to stand with verbal cues, CGA.  Pt ambulated with shortened steps, mildly antalgic pattern with RW and CGA, wheelchair follow due to BP, for 150 feet.  Pt returned to chair at bedside, BP stable so performed seated knee ex including heel slides, ankle pumps and assisted long arc quad sets.  Pt with AROM -8 to 85 degrees.  Pt's walker arrived and pt progressing well.  Assuming BP holds and she clears stairs, anticipate she will be cleared by PT for discharge tomorrow with HHPT  Will follow up for stair training tomorrow  Progression toward goals:        Improving appropriately and progressing toward goals        Improving slowly and progressing toward goals        Not making progress toward goals and plan of care will be  adjusted       PLAN:  Patient continues to benefit from skilled intervention to address the above impairments.  Continue treatment per established plan of care.  Discharge Recommendations:  Home Health  Further Equipment Recommendations for Discharge:  RW, arrived       SUBJECTIVE:   Patient stated ???Yes I feel ok right now.???      OBJECTIVE DATA SUMMARY:   Critical Behavior:  Neurologic State: Alert  Orientation Level: Oriented X4  Cognition: Appropriate for age attention/concentration  Safety/Judgement: Good awareness of safety precautions  Range of Motion:  AROM: Within functional limits  PROM: Within functional limits            Functional Mobility Training:  Bed Mobility:     Supine to Sit: Minimum assistance  Sit to Supine: Assist x1;Minimum assistance           Transfers:  Sit to Stand: Assist x1;Minimum assistance  Stand to Sit: Contact guard assistance        Bed to Chair: Contact guard assistance                    Balance:  Sitting: Intact  Standing: Intact;With support  Ambulation/Gait Training:  Distance (ft): 150 Feet (ft)  Assistive Device: Walker, rolling;Gait belt  Ambulation - Level of Assistance: Contact guard assistance;Assist x1  Gait Abnormalities: Antalgic;Decreased step clearance     Left Side Weight Bearing: As tolerated  Base of Support: Narrowed;Shift to right     Speed/Cadence: Slow  Step Length: Right shortened;Left shortened               Therapeutic Exercises:     EXERCISE   Sets   Reps   Active Active Assist   Passive Self ROM   Comments   Ankle Pumps 1 10                                                                                                                                                                     Quad Sets                                                                                                                                                                          Hamstring Sets                                                                                                                                                                         Short Arc Quads                                                                                                                               Knee Extension Stretch 1     [ ]                                           [ ]                                           [ ]                                           [ ]                                         20 minutes   Heel Slides 1 10 [X]                                         [ ]                                         [ ]                                         [ ]                                             Long Arc Quads 1 5 [ ]                                         [ ]                                         [ ]                                         [ ]                                             Knee Flexion Stretch     [ ]                                         [ ]                                         [ ]                                         [ ]   Straight Leg Raises     [ ]                                         [ ]                                         [ ]                                         [ ]                                                Pain:  Pain Scale 1: Numeric (0 - 10)  Pain Intensity 1: 3  Pain Location 1: Knee  Pain Orientation 1: Left  Pain Description 1: Aching  Pain Intervention(s) 1: Medication (see MAR)  Activity Tolerance:   good  Please refer to the flowsheet for vital signs taken during this treatment.  After treatment:   [X]  Patient left in no apparent distress sitting up in chair  [ ]  Patient left in no apparent distress in bed  [X]  Call bell left within reach  [X]  Nursing notified  [ ]  Caregiver present  [ ]  Bed alarm activated       COMMUNICATION/COLLABORATION:   The patient???s plan of care was discussed with: Registered Nurse and Social Worker     Meriel Flavors, PT   Time Calculation: 33 mins

## 2015-10-30 NOTE — Progress Notes (Signed)
Bedside and Verbal shift change report given to Estella RN  (oncoming nurse) by Amanda RN  (offgoing nurse). Report included the following information SBAR and Kardex.

## 2015-10-30 NOTE — Progress Notes (Signed)
Problem: Discharge Planning  Goal: *Discharge to safe environment  Outcome: Progressing Towards Goal  Disposition plan is to discharge home in care of family, home health, and self

## 2015-10-30 NOTE — Progress Notes (Signed)
CM reviewed chart.  CM noted pt had left total knee replacement with history of arthritis, chronic pain, depression, headache, pulmonary embolism, and right torn rotator cuff. CM met with pt to introduce self and role and offer support.  Bedside assessment completed.      CURRENT LIVING SITUATION-  Pt states she lives with her husband in a private residence. Pt was driving prior to this hospital stay and has assistance with transportation as needed.  Pt denies use of assistive devices and is independent with ADL's.  Pt is currently employed and has a plan for returning to work.  Pt's PCP is Dr Mellody Drown phone # [phone].  Pt last saw her PCP 04/2015.  Pt gets her prescriptions filled at [facility].  CM verified with pt her insurance is BLUE CROSS.  Pt does not have an AMD and declines information.      DISCHARGE PLANNING-  Pt denies need for assistance with medications, transportation, and follow up appointments.  Disposition plan is to discharge home in care of family, home health, and self.  CM offered choice of [facility] provider, Referral sent to At [facility].  Sande Rives, RN CRM    1320 CM received message that At [facility] can accept pt for [facility].  Information added to AVS.  Sande Rives, RN CRM      Care Management Interventions  PCP Verified by CM: Yes Mellody Drown phone # [phone])  Palliative Care Consult (Criteria: CHF and RRAT>21): No  Mode of Transport at Discharge: Self (private car)  Transition of Care Consult (CM Consult): [place]: No  Reason Outside [place]: Out of service area  [place]: No  Physical Therapy Consult: Yes  Occupational Therapy Consult: Yes  Speech Therapy Consult: No  Current Support Network: Lives with Spouse, Own Home  Confirm Follow Up Transport: Family  Plan discussed with Pt/Family/Caregiver: Yes  Freedom of Choice Offered: Yes  Discharge Location  Discharge Placement: Home with family assistance

## 2015-10-30 NOTE — Progress Notes (Signed)
TOTAL KNEE PROGRESS NOTE      Subjective:     Post-Operative Day: 1 Status Post left Total Knee Arthroplasty  Systemic or Specific Complaints:No Complaints    Objective:     Patient Vitals for the past 24 hrs:   BP Temp Pulse Resp SpO2   10/30/15 0339 94/56 97.8 ??F (36.6 ??C) 64 16 99 %   10/29/15 2344 94/57 97.9 ??F (36.6 ??C) 62 16 94 %   10/29/15 2038 102/58 98 ??F (36.7 ??C) 73 16 97 %   10/29/15 1747 95/55 98.1 ??F (36.7 ??C) 77 12 95 %   10/29/15 1650 93/60 98 ??F (36.7 ??C) 63 12 98 %   10/29/15 1553 110/68 97.5 ??F (36.4 ??C) 76 12 97 %   10/29/15 1440 112/68 97.5 ??F (36.4 ??C) 85 12 98 %   10/29/15 1417 95/65 - - - -   10/29/15 1415 - - 71 11 -   10/29/15 1410 - - 71 18 90 %   10/29/15 1405 - - 74 15 91 %   10/29/15 1400 117/66 - 73 14 95 %   10/29/15 1355 - - 77 12 95 %   10/29/15 1350 - - 72 22 95 %   10/29/15 1345 108/71 - 71 - 95 %   10/29/15 1340 - 97.9 ??F (36.6 ??C) 73 16 95 %   10/29/15 1330 103/74 - 75 17 95 %   10/29/15 1325 - - 77 21 95 %   10/29/15 1320 111/69 - 78 19 95 %   10/29/15 1315 105/64 - 80 17 95 %   10/29/15 1310 105/67 - 81 12 94 %   10/29/15 1306 - - 81 9 92 %   10/29/15 1305 99/68 - - - -   10/29/15 1304 - 97.8 ??F (36.6 ??C) 83 10 94 %   10/29/15 0851 101/69 - 63 20 98 %   10/29/15 0845 107/61 - 70 20 97 %   10/29/15 0840 101/65 - 67 20 96 %   10/29/15 0830 98/62 - 66 20 96 %       General: alert, cooperative, no distress, appears stated age   Wound: Wound clean and dry no evidence of infection., No Erythema, No Edema, No Drainage and Wound Intact   Motion: Extension: Full Extension   DVT Exam: No evidence of DVT seen on physical exam.  Negative Homan's sign.  No cords or calf tenderness.  No significant calf/ankle edema.     Data Review:    Recent Results (from the past 24 hour(s))   GLUCOSE, POC    Collection Time: 10/29/15  8:03 AM   Result Value Ref Range    Glucose (POC) 107 (H) 65 - 100 mg/dL    Performed by Cecelia ByarsJil Sweeney    METABOLIC PANEL, BASIC    Collection Time: 10/30/15  3:45 AM    Result Value Ref Range    Sodium 139 136 - 145 mmol/L    Potassium 4.1 3.5 - 5.1 mmol/L    Chloride 103 97 - 108 mmol/L    CO2 28 21 - 32 mmol/L    Anion gap 8 5 - 15 mmol/L    Glucose 129 (H) 65 - 100 mg/dL    BUN 9 6 - 20 MG/DL    Creatinine 9.560.75 2.130.55 - 1.02 MG/DL    BUN/Creatinine ratio 12 12 - 20      GFR est AA >60 >60 ml/min/1.8173m2    GFR est non-AA >60 >60 ml/min/1.7873m2  Calcium 8.2 (L) 8.5 - 10.1 MG/DL   HEMOGLOBIN    Collection Time: 10/30/15  3:45 AM   Result Value Ref Range    HGB 8.2 (L) 11.5 - 16.0 g/dL         Assessment:     Status Post left Total Knee Arthroplasty. Doing well postoperatively.. Bandage aquacell, clean/dry. Labs stable, Xarelto therapy.  PT progressing with standing.  Pain control WNL.     Plan:     Continues current post-op course  Continue PT per protocol  Possible home [facility] discharge today, probable tomorrow afternoon.

## 2015-10-30 NOTE — Progress Notes (Signed)
Problem: Self Care Deficits Care Plan (Adult)  Goal: *Acute Goals and Plan of Care (Insert Text)  Occupational Therapy Goals  Initiated: 10/30/2015    1. Patient will perform grooming with supervision/set-up standing at sink within 3 day(s).  2. Patient will perform bathing with supervision/set-up from chair within 3 day(s).  3. Patient will perform upper body dressing and lower body dressing with supervision/set-up within 3 day(s).  4. Patient will perform toilet transfers with supervision/set-up within 3 day(s).  5. Patient will perform all aspects of toileting with supervision/set-up within 3 day(s).  OCCUPATIONAL THERAPY EVALUATION  Patient: Lindsay [name] (60 y.o. female)  Date: 10/30/2015  Primary Diagnosis: DJD LEFT KNEE  Primary osteoarthritis of left knee  Procedure(s) (LRB):  LEFT TOTAL KNEE REPLACEMENT (Left) 1 Day Post-Op   Precautions:   Fall, WBAT      ASSESSMENT :  Based on the objective data described below, the patient presents with decreased independence with self care and functional mobility following admission for left TKR.  Pt was able to progress OOB to chair to complete CHG bathing.  Pt did well with all tasks.  Pt BP low but stable with progression OOB and asymptomatic.  Pt reports she is very OCD at baseline and this did impair her function this morning as she was insistent on completing things as she did prior to surgery.  Pt provided education for safe techniques following TKR. And energy conservation to ensure independence for discharge home.  She is agreeable to training and education following introduction as to why therapist was asking her to complete things in this manner.  Recommend home with family support and assistance.      Patient will benefit from skilled intervention to address the above impairments.  Patient???s rehabilitation potential is considered to be Good  Factors which may influence rehabilitation potential include:                None noted                Mental ability/status               Medical condition               Home/family situation and support systems               Safety awareness               Pain tolerance/management               Other:        PLAN :  Recommendations and Planned Interventions:                 Self Care Training                          Therapeutic Activities                 Functional Mobility Training            Cognitive Retraining                 Therapeutic Exercises                   Endurance Activities                 Balance Training                     Neuromuscular Re-Education  [ ]                Visual/Perceptual Training     [X]    Home Safety Training  [X]                Patient Education                 [X]         Family Training/Education  [ ]                Other (comment):     Frequency/Duration: Patient will be followed by occupational therapy 5 times a week to address goals.  Discharge Recommendations: None  Further Equipment Recommendations for Discharge: none       SUBJECTIVE:   Patient stated ???I feel so much better after washing up.???      OBJECTIVE DATA SUMMARY:   HISTORY:   Past Medical History:   Diagnosis Date   ??? Abnormal brain scan     ??? Arthritis     ??? Chronic pain     ??? Depression     ??? Falls frequently     ??? Fracture of left ankle     ??? Headache 2011   ??? Ill-defined condition 09/15/2015     FX R HAND   ??? Other and unspecified hyperlipidemia 12/27/2012   ??? PE (pulmonary embolism)     ??? Torn rotator cuff       right     Past Surgical History:   Procedure Laterality Date   ??? BREAST SURGERY PROCEDURE UNLISTED   2002     AUGMENTATION   ??? HX APPENDECTOMY       ??? HX CESAREAN SECTION         x4   ??? HX GI         COLONOSCOPY   ??? HX ORTHOPAEDIC         rotater cuff r. shoulder   ??? HX ORTHOPAEDIC         left ankle fracture treated with air cast   ??? HX ORTHOPAEDIC   6/13     left shoulder fxt and dislocation   ??? HX ORTHOPAEDIC         TORN TENDON L ANKLE     ??? HX OTHER SURGICAL   2010     left foot torn tendon   ??? HX OTHER SURGICAL       ??? HX TUBAL LIGATION            Prior Level of Function/Home Situation: pt was independent at home prior to surgery. She reports she has multiple falls from left knee buckling.   Expanded or extensive additional review of patient history:      Home Situation  Home Environment: Private residence  # Steps to Enter: 5  Rails to Enter: Yes  Hand Rails : Bilateral  One/Two Story Residence:  (three story home, 2 living with baseline)  # of Interior Steps: 14  Living Alone: No  Support Systems: Family member(s)  Patient Expects to be Discharged to:: Private residence  Current DME Used/Available at Home: Cane, straight  Tub or Shower Type: Shower  [X]   Right hand dominant             [ ]   Left hand dominant     EXAMINATION OF PERFORMANCE DEFICITS:  Cognitive/Behavioral Status:  Neurologic State: Alert  Orientation Level: Oriented X4  Cognition: Appropriate for age attention/concentration  Perception: Appears intact  Perseveration: No perseveration noted  Safety/Judgement: Good awareness of safety precautions     Skin: dressing intact; aquacel intact      Edema: none noted     Hearing:  Auditory  Auditory Impairment: None     Vision/Perceptual:                           Acuity: Within Defined Limits          Range of Motion:     AROM: Within functional limits  PROM: Within functional limits                       Strength:     Strength: Within functional limits                 Coordination:  Coordination: Within functional limits  Fine Motor Skills-Upper: Right Intact;Left Intact    Gross Motor Skills-Upper: Right Intact;Left Intact     Tone & Sensation:     Tone: Normal  Sensation: Intact                       Balance:  Sitting: Intact  Standing: Intact;With support     Functional Mobility and Transfers for ADLs:  Bed Mobility:  Supine to Sit: Minimum assistance  Sit to Supine:  (remained in chair)     Transfers:   Sit to Stand: Contact guard assistance  Stand to Sit: Contact guard assistance  Bed to Chair: Contact guard assistance  Toilet Transfer : Contact guard assistance     ADL Assessment:  Feeding: Supervision     Oral Facial Hygiene/Grooming: Supervision     Bathing: Minimum assistance     Upper Body Dressing: Supervision     Lower Body Dressing: Minimum assistance     Toileting: Contact guard assistance                 ADL Intervention and task modifications:   Pt was able to complete bathing from EOB.  Pt completed CHG bathing.  Pt given cues and education for safety and progression of mobility to chair.  Pt overall did very well with tasks and toelrated activity with minimal pain.  Pt remained up in chair following intervention.         Cognitive Retraining  Safety/Judgement: Good awareness of safety precautions     Functional Measure:  Barthel Index:      Bathing: 0  Bladder: 10  Bowels: 10  Grooming: 5  Dressing: 5  Feeding: 10  Mobility: 5  Stairs: 0  Toilet Use: 5  Transfer (Bed to Chair and Back): 10  Total: 60         Barthel and G-code impairment scale:  Percentage of impairment CH  0% CI  1-19% CJ  20-39% CK  40-59% CL  60-79% CM  80-99% CN  100%   Barthel Score 0-100 100 99-80 79-60 59-40 20-39 1-19    0   Barthel Score 0-20 20 17-19 13-16 9-12 5-8 1-4 0      The Barthel ADL Index: Guidelines  1. The index should be used as a record of what a patient does, not as a record of what a patient could do.  2. The main aim is to establish degree of independence from any help, physical or verbal, however minor and for whatever reason.  3. The need for supervision renders the patient not independent.  4. A patient's performance should be established using the best available evidence. Asking the patient, friends/relatives and nurses are the usual sources, but direct observation and common sense are also important. However direct testing is not needed.   5. Usually the patient's performance over the preceding 24-48 hours is important, but occasionally longer periods will be relevant.  6. Middle categories imply that the patient supplies over 50 per cent of the effort.  7. Use of aids to be independent is allowed.     Clarisa Kindred., Barthel, D.W. [phone]). Functional evaluation: the Barthel Index. Md State Med J (14)2.  Zenaida Niece der Pittman, J.J.M.F, [place], Ian Malkin., Margret Chance., [place], [place]. (1999). Measuring the change indisability after inpatient rehabilitation; comparison of the responsiveness of the Barthel Index and Functional Independence Measure. Journal of Neurology, Neurosurgery, and Psychiatry, 66(4), [phone].  Dawson Bills, N.J.A, [place],  W.J.M, & [place], M.A. (2004.) Assessment of post-stroke quality of life in cost-effectiveness studies: The usefulness of the Barthel Index and the EuroQoL-5D. Quality of Life Research, 13, 098-11      G codes:  In compliance with CMS???s Claims Based Outcome Reporting, the following G-code set was chosen for this patient based on their primary functional limitation being treated:     The outcome measure chosen to determine the severity of the functional limitation was the Barthel index with a score of 60/100 which was correlated with the impairment scale.      ?? Self Care:              [phone] - CURRENT STATUS:    CJ - 20%-39% impaired, limited or restricted              [id number] - GOAL STATUS:           CI - 1%-19% impaired, limited or restricted              [id number] - D/C STATUS:                       ---------------To be determined---------------      Occupational Therapy Evaluation Charge Determination   History Examination Decision-Making   LOW Complexity : Brief history review  HIGH Complexity : 5 or more performance deficits relating to physical, cognitive , or psychosocial skils that result in activity limitations and / or participation restrictions MEDIUM Complexity : Patient may present with comorbidities  that affect occupational performnce. Miniml to moderate modification of tasks or assistance (eg, physical or verbal ) with assesment(s) is necessary to enable patient to complete evaluation       Based on the above components, the patient evaluation is determined to be of the following complexity level: LOW   Pain:  Pain Scale 1: Numeric (0 - 10)  Pain Intensity 1: 5  Pain Location 1: Knee  Pain Orientation 1: Left  Pain Description 1: Aching  Pain Intervention(s) 1: Medication (see MAR)  Activity Tolerance:   VSS throughout session.      After treatment:   [X]  Patient left in no apparent distress sitting up in chair  [ ]  Patient left in no apparent distress in bed  [X]  Call bell left within reach  [X]  Nursing notified  [ ]  Caregiver present  [ ]  Bed alarm activated      COMMUNICATION/EDUCATION:   The patient???s plan of care was discussed with: Physical Therapist and Registered Nurse.  [X]  Home safety education was provided and the patient/caregiver indicated understanding.  [X]  Patient/family  have participated as able in goal setting and plan of care.  [ ]  Patient/family agree to work toward stated goals and plan of care.  [ ]  Patient understands intent and goals of therapy, but is neutral about his/her participation.  [ ]  Patient is unable to participate in goal setting and plan of care.  This patient???s plan of care is appropriate for delegation to OTA.     Thank you for this referral.  Santa LighterSuzanne M Pegram, OT

## 2015-10-30 NOTE — Other (Signed)
Bedside and Verbal shift change report given to Amanda RN (oncoming nurse) by Laura C RN (offgoing nurse). Report included the following information SBAR, Kardex, Intake/Output, MAR and Recent Results.

## 2015-10-31 LAB — HEMOGLOBIN: HGB: 7.5 g/dL — ABNORMAL LOW (ref 11.5–16.0)

## 2015-10-31 MED ORDER — OXYCODONE 5 MG TAB
5 mg | ORAL | Status: DC | PRN
Start: 2015-10-31 — End: 2015-10-31

## 2015-10-31 MED ORDER — OXYCODONE 5 MG TAB
5 mg | ORAL | Status: DC | PRN
Start: 2015-10-31 — End: 2015-10-31
  Administered 2015-10-31: 17:00:00 via ORAL

## 2015-10-31 MED FILL — ESCITALOPRAM 10 MG TAB: 10 mg | ORAL | Qty: 2

## 2015-10-31 MED FILL — ACETAMINOPHEN 325 MG TABLET: 325 mg | ORAL | Qty: 2

## 2015-10-31 MED FILL — ROPINIROLE 1 MG TAB: 1 mg | ORAL | Qty: 2

## 2015-10-31 MED FILL — BD POSIFLUSH NORMAL SALINE 0.9 % INJECTION SYRINGE: INTRAMUSCULAR | Qty: 10

## 2015-10-31 MED FILL — OXYCODONE 5 MG TAB: 5 mg | ORAL | Qty: 1

## 2015-10-31 MED FILL — XARELTO 10 MG TABLET: 10 mg | ORAL | Qty: 1

## 2015-10-31 MED FILL — SENNA PLUS 8.6 MG-50 MG TABLET: ORAL | Qty: 1

## 2015-10-31 MED FILL — DIAZEPAM 2 MG TAB: 2 mg | ORAL | Qty: 3

## 2015-10-31 MED FILL — DIAZEPAM 2 MG TAB: 2 mg | ORAL | Qty: 5

## 2015-10-31 MED FILL — CELEBREX 200 MG CAPSULE: 200 mg | ORAL | Qty: 1

## 2015-10-31 MED FILL — PANTOPRAZOLE 40 MG TAB, DELAYED RELEASE: 40 mg | ORAL | Qty: 1

## 2015-10-31 NOTE — Progress Notes (Signed)
I have reviewed discharge instructions with the patient and spouse.  The patient and spouse verbalized understanding. Patient's belongings were packed by the spouse.  Patient was taken to patient discharge in a wheelchair by volunteers.

## 2015-10-31 NOTE — Progress Notes (Signed)
Problem: Mobility Impaired (Adult and Pediatric)  Goal: *Acute Goals and Plan of Care (Insert Text)  Physical Therapy Goals  Initiated 10/29/2015    1. Patient will move from supine to sit and sit to supine, scoot up and down and roll side to side in bed with independence within 4 days.  2. Patient will perform sit to stand with modified independence within 4 days.  3. Patient will ambulate with modified independence for 150 feet with the least restrictive device within 4 days.  4. Patient will ascend/descend 6 stairs with handrail(s) per home set-up with supervision/set-up within 4 days.  5. Patient will perform home exercise program per protocol with modified independence within 4 days.  6. Patient will demonstrate AROM 0-90 degrees in operative joint within 4 days.   PHYSICAL THERAPY TREATMENT  Patient: Lindsay [name] (60 y.o. female)  Date: 10/31/2015  Diagnosis: DJD LEFT KNEE  Primary osteoarthritis of left knee Primary osteoarthritis of left knee  Procedure(s) (LRB):  LEFT TOTAL KNEE REPLACEMENT (Left) 2 Days Post-Op  Precautions: Fall, WBAT      ASSESSMENT:  Pt cleared by nsg.  Pt able to mobilize through standing with supervision.  Pt gaits 200 feet with RW and mainly step to pattern, occasional step thru with verbal cues.  Pt up on commode to attempt BM, then brought to PT gym after OT session to do steps.  Pt has railing on the left at home with 13 steps and a cane.  Pt cued for steps, performed twice with min assist and verbal cues for sequencing, use of left rail and cane.  After being seated, pt reporting dizziness and became pale.  BP 111/64 HR74 after legs elevated and WC reclined.  Pt returned to room and back to bed.  Pt currently not cleared as she is a high fall risk with dizziness,  Etc.  D/w nsg and NP, will see pt second session this pm.    Progression toward goals:        Improving appropriately and progressing toward goals        Improving slowly and progressing toward goals         Not making progress toward goals and plan of care will be adjusted       PLAN:  Patient continues to benefit from skilled intervention to address the above impairments.  Continue treatment per established plan of care.  Discharge Recommendations:  Home Health  Further Equipment Recommendations for Discharge:  RW,cane, raised toilet seat (pt will obtain)       SUBJECTIVE:   Patient stated ???I just feel not quite right in my head.???      OBJECTIVE DATA SUMMARY:   Critical Behavior:  Neurologic State: Alert  Orientation Level: Oriented X4  Cognition: Appropriate decision making, Appropriate for age attention/concentration, Appropriate safety awareness  Safety/Judgement: Good awareness of safety precautions  Range of Motion:         Functional Mobility Training:  Bed Mobility:     Supine to Sit: Supervision      Transfers:  Sit to Stand:  (verbal cues)      Balance:  Sitting: Intact  Standing: Intact;With support  Ambulation/Gait Training:  Distance (ft): 250 Feet (ft)  Assistive Device: Walker, rolling;Gait belt  Ambulation - Level of Assistance: Supervision        Gait Abnormalities: Antalgic     Left Side Weight Bearing: As tolerated  Base of Support: Shift to right     Speed/Cadence:  Pace decreased (<100 feet/min)  Step Length: Right shortened;Left shortened      Stairs:   pt able to ascend and descend 4 steps twice with hand rail and cane with min to mod assist due to dizziness     Pain:  Pain Scale 1: Numeric (0 - 10)  Pain Intensity 1: 2  Pain Location 1: Knee  Pain Orientation 1: Left  Pain Description 1: Aching  Pain Intervention(s) 1: Medication (see MAR)  Activity Tolerance:   Good, pt very motivated but limited by dizziness.  Please refer to the flowsheet for vital signs taken during this treatment.  After treatment:   [ ]  Patient left in no apparent distress sitting up in chair  [X]  Patient left in no apparent distress in bed  [X]  Call bell left within reach  [X]  Nursing notified   [ ]  Caregiver present  [ ]  Bed alarm activated      COMMUNICATION/COLLABORATION:   The patient???s plan of care was discussed with: Occupational Therapist and Registered Nurse     Meriel FlavorsNancy L Davis, PT   Time Calculation: 24 mins

## 2015-10-31 NOTE — Progress Notes (Signed)
Problem: Mobility Impaired (Adult and Pediatric)  Goal: *Acute Goals and Plan of Care (Insert Text)  Physical Therapy Goals  Initiated 10/29/2015    1. Patient will move from supine to sit and sit to supine, scoot up and down and roll side to side in bed with independence within 4 days.  2. Patient will perform sit to stand with modified independence within 4 days.  3. Patient will ambulate with modified independence for 150 feet with the least restrictive device within 4 days.  4. Patient will ascend/descend 6 stairs with handrail(s) per home set-up with supervision/set-up within 4 days.  5. Patient will perform home exercise program per protocol with modified independence within 4 days.  6. Patient will demonstrate AROM 0-90 degrees in operative joint within 4 days.   PHYSICAL THERAPY TREATMENT  Patient: Lindsay [name] ([age] y.o. female)  Date: 10/31/2015  Diagnosis: DJD LEFT KNEE  Primary osteoarthritis of left knee Primary osteoarthritis of left knee  Procedure(s) (LRB):  LEFT TOTAL KNEE REPLACEMENT (Left) 2 Days Post-Op  Precautions: Fall, WBAT      ASSESSMENT:  Pt cleared by nsg.  [facility]. Pt wheeled to PT gym and performed 4 steps with cane with some verbal cueing, esp for deep breathing.    Balance and technique much improved this pm.  Pt had no complaints of dizziness.  Pt safe for discharge from PT perspective. Patient is cleared for discharge from PT standpoint:  YES [X]      NO [ ]    Progression toward goals:  [X]       Improving appropriately and progressing toward goals  [ ]       Improving slowly and progressing toward goals  [ ]       Not making progress toward goals and plan of care will be adjusted       PLAN:  Patient continues to benefit from skilled intervention to address the above impairments.  Continue treatment per established plan of care.  Discharge Recommendations:  Home Health  Further Equipment Recommendations for Discharge:  Has RW       SUBJECTIVE:    "I feel good, Im ready to go home."      OBJECTIVE DATA SUMMARY:   Range of Motion:   -8 to 95 degrees   Functional Mobility Training:  Bed Mobility:     Supine to Sit: Supervision              Transfers:  Sit to Stand:  (verbal cues)            Ambulation/Gait Training:  Distance (ft): 250 Feet (ft)  Assistive Device: Walker, rolling;Gait belt  Ambulation - Level of Assistance: Supervision        Gait Abnormalities: Antalgic     Left Side Weight Bearing: As tolerated  Base of Support: Shift to right     Speed/Cadence: Pace decreased (<100 feet/min)  Step Length: Right shortened;Left shortened         Stairs:      up and down 4 steps with one rail and a cane     Therapeutic Exercises:   Exercises performed per protocol.  See morning treatment note for description.  Pain:  Pain Scale 1: Numeric (0 - 10)  Pain Intensity 1: 1  Pain Location 1: Knee  Pain Orientation 1: Left  Pain Description 1: Aching  Pain Intervention(s) 1: Ice  Activity Tolerance:   good  Please refer to the flowsheet  for vital signs taken during this treatment.  After treatment:    Patient left in no apparent distress sitting up in chair   Patient left in no apparent distress in bed   Call bell left within reach   Nursing notified   Caregiver present   Bed alarm activated      COMMUNICATION/COLLABORATION:   The patient???s plan of care was discussed with: Registered Nurse     Meriel Flavors, PT   Time Calculation: 14 mins

## 2015-10-31 NOTE — Discharge Summary (Signed)
Discharge Summary    Physician: Mardi Mainland, MD    Physician Assistant: Robley Fries, PA-C    Admit Diagnosis:  DJD LEFT KNEE  Primary osteoarthritis of left knee    Final Diagnosis:   1. Advanced degenerative arthritis of left knee .    Procedure: Left total knee replacement    Complications: None.    History of Present Illness: The patient has a long-standing history of pain within the left knee . The patient has severe degenerative arthritis of the left knee and has exhausted conservative therapy at this time including prior intra-articular injections, activity modifications, OTC NSAIDS. The patient has been limited in their ability to perform ADLs, walk short distances or climb steps appropriately.  The patient presents for the above-mentioned procedure. The patient has been cleared from a medical and cardiac standpoint.    Past Medical History:  Recorded in the chart.    Physical Examination: Also recorded in the chart.  The patient is noted for ambulating with an antalgic gait favoring the left side.  Examination of the left knee reveals she has marked patellofemoral crepitance. She lacks 5 degrees full extension, flexes 119 degrees with diffuse tenderness and crepitance. The knee is stable. I do not think she has an effusion. Extensor mechanism is intact. On exam of the right knee she lacks 4 degrees full extension, flexes 117 degrees. There is diffuse tenderness and crepitance to range of motion of the right knee. No effusion .  X-rays reveal marked medial joint space narrowing but there is also patellofemoral degenerative changes as well. Slight varus deformity. On the right knee there is near complete loss of medial joint space narrowing. Also significant patellofemoral degenerative changes.    Course in the Hospital:  The patient was admitted to the hospital.  The Pt. Underwent a left total knee replacement .  Postoperatively, the pt.  did well. The pt. Remained stable from a medical standpoint.  The patient ambulated, WBAT weightbearing within the hospital with Physical Therapy. The patient continued with this therapy at [facility] with PT.  The patient began taking Coumadin pre-operatively for DVT prophylaxis and will continue on this for one month. At the time of discharge, there was no evidence of any DVT noted.  The patient had negative Homans signs bilateral lower extremities.  At the time of discharge, the patient's left knee incision appeared with staples intact.  No signs of infection or inflammation noted. The patient will follow up in our office in 2-1/2 weeks.    DC med list:  Discharge Medication List as of 10/31/2015  3:03 PM      START taking these medications    Details   oxyCODONE IR (ROXICODONE) 5 mg immediate release tablet Take 1 Tab by mouth every four (4) hours as needed for Pain. Max Daily Amount: 30 mg., Print, Disp-60 Tab, R-0      rivaroxaban (XARELTO) 10 mg tablet Take 1 Tab by mouth daily (with dinner). Indications: KNEE REPLACEMENT DEEP VEIN THROMBOSIS PREVENTION, Print, Disp-12 Tab, R-0         CONTINUE these medications which have NOT CHANGED    Details   rOPINIRole (REQUIP) 1 mg tablet Take 1 tab 1-3 hours before bedtime, Normal, Disp-30 Tab, R-3      ergocalciferol (ERGOCALCIFEROL) 50,000 unit capsule Take 1 Cap by mouth every seven (7) days., Normal, Disp-12 Cap, R-1      escitalopram oxalate (LEXAPRO) 20 mg tablet Take 1 Tab by mouth daily., Normal,  Disp-30 Tab, R-5      diazepam (VALIUM) 10 mg tablet Take 1 tablet by mouth daily for anxiety, Print, Disp-30 Tab, R-0      omeprazole (PRILOSEC) 20 mg capsule Take 20 mg by mouth nightly., Historical Med         STOP taking these medications       ibuprofen (MOTRIN) 200 mg tablet Comments:   Reason for Stopping:               Patient Disposition: Home  Followup Care:  Discharge Condition: good  PT/OT per Home Health  Regular Diet  As directed     Follow-up Information     Follow up With Details Comments Contact Info    Clarene CritchleyFrancesca L Glynn, MD   [address]  [address]  [place] [id number]  [phone]      [facility]  Home Health, please call office if you have not heard from staff member by 12 noon first full day after discharge  [address]  [place] [id number]  [phone]                  Robley FriesShea Zayli Villafuerte, PA-C

## 2015-10-31 NOTE — Progress Notes (Signed)
Spiritual Care Partner Volunteer visited patient in [facility] on 10/31/15.  Documented by:    Chaplain Brian Bower M.S., M.Div.  Chaplain Services [phone]

## 2015-10-31 NOTE — Progress Notes (Signed)
Bedside and Verbal shift change report given to Kelly (oncoming nurse) by Estella (offgoing nurse). Report included the following information SBAR, Kardex, Intake/Output, MAR and Accordion.

## 2015-10-31 NOTE — Progress Notes (Signed)
TOTAL KNEE PROGRESS NOTE      Subjective:     Post-Operative Day: 2 Status Post left Total Knee Arthroplasty  Systemic or Specific Complaints:No Complaints    Objective:     Patient Vitals for the past 24 hrs:   BP Temp Pulse Resp SpO2 Weight   10/31/15 0746 113/60 97.9 ??F (36.6 ??C) 64 20 96 % -   10/31/15 0225 94/51 97.6 ??F (36.4 ??C) 67 16 94 % -   10/30/15 2033 111/61 98 ??F (36.7 ??C) 65 16 94 % -   10/30/15 1452 113/70 97.9 ??F (36.6 ??C) 65 16 97 % -   10/30/15 1030 94/59 - - - - -   10/30/15 1025 91/40 - - - - -   10/30/15 1020 (!) 89/49 - - - - -   10/30/15 0933 106/62 - - - - -   10/30/15 0915 102/63 - 69 - - -   10/30/15 0905 98/55 - 66 - - -   10/30/15 0828 95/51 97.8 ??F (36.6 ??C) 78 16 96 % 70.3 kg (154 lb 15.7 oz)       General: alert, cooperative, no distress, appears stated age   Wound: Wound clean and dry no evidence of infection., No Erythema, No Edema, No Drainage and Wound Intact   Motion: Extension: Full Extension   DVT Exam: No evidence of DVT seen on physical exam.  Negative Homan's sign.  No cords or calf tenderness.  No significant calf/ankle edema.     Data Review:    Recent Results (from the past 24 hour(s))   HEMOGLOBIN    Collection Time: 10/31/15  2:23 AM   Result Value Ref Range    HGB 7.5 (L) 11.5 - 16.0 g/dL         Assessment:     Status Post left Total Knee Arthroplasty. Doing well postoperatively.. Bandage aquacell, clean/dry. Labs stable.  PT progressing well, one light-headed episode.  Pain control WNL.     Plan:     Continues current post-op course  Continue PT per protocol  [facility] if cleared by PT.

## 2015-10-31 NOTE — Progress Notes (Signed)
Problem: Self Care Deficits Care Plan (Adult)  Goal: *Acute Goals and Plan of Care (Insert Text)  Occupational Therapy Goals  Initiated: 10/30/2015    1. Patient will perform grooming with supervision/set-up standing at sink within 3 day(s).  2. Patient will perform bathing with supervision/set-up from chair within 3 day(s).  3. Patient will perform upper body dressing and lower body dressing with supervision/set-up within 3 day(s).  4. Patient will perform toilet transfers with supervision/set-up within 3 day(s).  5. Patient will perform all aspects of toileting with supervision/set-up within 3 day(s).     OCCUPATIONAL THERAPY TREATMENT/DISCHARGE  Patient: Lindsay [name] ([age] y.o. female)  Date: 10/31/2015  Diagnosis: DJD LEFT KNEE  Primary osteoarthritis of left knee Primary osteoarthritis of left knee  Procedure(s) (LRB):  LEFT TOTAL KNEE REPLACEMENT (Left) 2 Days Post-Op  Precautions: Fall, WBAT      ASSESSMENT:  Pt was able to complete all self care activities at supervision level following training and education.  Pt did great with all training and education.  Pt was able to complete dressing, grooming, and toileting.  Pt given occassional cues for safety and hand placement.  Pt independent with all training needed.  Pt requires no further need for OT intervention.   Progression toward goals:  [X]             Improving appropriately and progressing toward goals  [ ]             Improving slowly and progressing toward goals  [ ]             Not making progress toward goals and plan of care will be adjusted       PLAN:  Patient will be discharged from occupational therapy at this time.  Rationale for discharge:  [X]    Goals Achieved  [ ]    Plateau Reached  [ ]    Patient not participating in therapy  [ ]    Other:  Discharge Recommendations:  None  Further Equipment Recommendations for Discharge:  Raised toilet seat       SUBJECTIVE:   Patient stated ???I am ready to go home.???      OBJECTIVE DATA SUMMARY:    Cognitive/Behavioral Status:  Neurologic State: Alert  Orientation Level: Oriented X4  Cognition: Appropriate for age attention/concentration  Perception: Appears intact  Perseveration: No perseveration noted  Safety/Judgement: Good awareness of safety precautions     Functional Mobility and Transfers for ADLs:  Bed Mobility:  Supine to Sit: Supervision     Transfers:  Sit to Stand:  (verbal cues)  Functional Transfers  Bathroom Mobility: Supervision/set up  Toilet Transfer : Supervision     Balance:  Sitting: Intact  Standing: Intact;With support     ADL Intervention:    Shower transfers:   Discussed technique for walk in shower transfers. Pt educated to step in with strong leg and to come out with operated leg to ensure safety with task.  Pt instructed to have a family member or friend with them when they first attempt to get in the shower.  Pt educated they can use the walker as needed to step into the shower for stability.  Pt also educated they can use the [facility] as a seat as needed for the shower.      IADL training:   Discussed at length precautions with IADL tasks.  Discussed body alignment and ensuring pt does not twist hips/knees to ensure proper body alignment.  Discussed finger tip rule for daily  activities and to use a reacher for all tasks that are out of reach.  Pt discussed to avoid tasks such as sweeping, mopping, vacuuming, changing bed linens, carrying a laundry basket, reaching into a low oven, or cleaning showers and toilets.  Pt verbalized understanding of instructions.  Did encourage pt to stand at sink for grooming, washing dishes, and light meal preparations to increase overall standing tolerance and independence with all activities.      Lower body dressing and training from EOB and did great with all training this morning.      Grooming  Grooming Assistance: Supervision/set up (standing at sink)      Upper Body Dressing Assistance  Dressing Assistance: Supervision/set-up   Bra: Supervision/set-up  Pullover Shirt: Supervision/set-up     Lower Body Dressing Assistance  Dressing Assistance: Supervision/set up  Underpants: Supervision/set-up  Pants With Elastic Waist: Supervision/set-up  Socks: Supervision/set-up  Shoes with Cloth Laces: Supervision/set-up (using short shoe horn)     Toileting  Toileting Assistance: Supervision/set up  Bladder Hygiene: Supervision/set-up  Bowel Hygiene: Supervision/set-up  Clothing Management: Supervision/set-up  Cues: Verbal cues provided     Cognitive Retraining  Safety/Judgement: Good awareness of safety precautions     Pain:  Pain Scale 1: Numeric (0 - 10)  Pain Intensity 1: 2  Pain Location 1: Knee  Pain Orientation 1: Left  Pain Description 1: Aching  Pain Intervention(s) 1: Medication (see MAR)  Activity Tolerance:   VSS throughout session.      After treatment:    Patient left in no apparent distress sitting up in chair   Patient left in no apparent distress in bed   Call bell left within reach   Nursing notified   Caregiver present   Bed alarm activated      COMMUNICATION/COLLABORATION:   The patient???s plan of care was discussed with: Physical Therapist and Registered Nurse     Santa Lighter, OT  Time Calculation: 25 mins

## 2015-11-07 ENCOUNTER — Encounter: Attending: Internal Medicine | Primary: Internal Medicine

## 2015-11-10 NOTE — Telephone Encounter (Signed)
Spoke with patient and informed her that Dr. Alison Murrayansom is fine with her increase in her Requip. Patient was given an opportunity to ask questions, repeated information, and verbalized understanding.

## 2015-11-10 NOTE — Telephone Encounter (Signed)
Spoke with patient. She stated she recently had a knee replacement about 2 weeks ago and is having a hard time getting around with her walker and would like to change her appointment. She stated her is to have her other knee replaced at the beginning in June so she was scheduled for her follow up on 12/24/15 at 10:40am.    She stated about 6 weeks ago she increased her Requip to 1.5 tabs at night which has helped. She wanted to make sure that Dr. Alison Murrayansom was OK with her increasing this.

## 2015-11-10 NOTE — Telephone Encounter (Signed)
Pt calling, phone cut off while speaking with nurse. If the nurse was finished, there is no need to call the pt back, if the nurse was not finished, she can call back.

## 2015-11-10 NOTE — Telephone Encounter (Signed)
[place] - That's fine.

## 2015-11-10 NOTE — Telephone Encounter (Signed)
Pt has a f/u scheduled for tomorrow and is requesting that the nurse call her today before her appt.

## 2015-11-11 ENCOUNTER — Encounter: Attending: Neurology | Primary: Internal Medicine

## 2015-12-02 MED ORDER — ROPINIROLE 1 MG TAB
1 mg | ORAL_TABLET | ORAL | 2 refills | Status: DC
Start: 2015-12-02 — End: 2016-02-02

## 2015-12-02 NOTE — Telephone Encounter (Signed)
Spoke with patient. She is requesting a refill of Requip 1.5 mg (see phone note on 11/10/15).  She stated that she has been having a lot of issues with her right leg "not staying still". She reported that she has recently had a knee replacement on her left knee and is scheduled to have her right knee replaced on 12/17/15. She stated she is also going to physical therapy and that seems to be helping.

## 2015-12-02 NOTE — Telephone Encounter (Signed)
Pt calling in re to her Requip. She said it needed to be changed from 1mg  to 1.5mg . She is now completely out of her 1.5mg  and needs a refill. Please give her a call back.

## 2015-12-02 NOTE — Telephone Encounter (Signed)
Refilled at higher dose.

## 2015-12-03 NOTE — Telephone Encounter (Signed)
Spoke with patient and informed her [facility]. Patient was given an opportunity to ask questions, repeated information, and verbalized understanding.

## 2015-12-03 NOTE — Telephone Encounter (Signed)
Calling in regards to phone call from yesterday about Requip rx, wanted to know if nurse found out any information.

## 2015-12-10 ENCOUNTER — Inpatient Hospital Stay: Admit: 2015-12-10 | Payer: BLUE CROSS/BLUE SHIELD | Primary: Internal Medicine

## 2015-12-10 DIAGNOSIS — Z01812 Encounter for preprocedural laboratory examination: Secondary | ICD-10-CM

## 2015-12-10 LAB — TYPE AND SCREEN
ABO/Rh: AB POS
Antibody Screen: NEGATIVE

## 2015-12-10 LAB — URINALYSIS W/ REFLEX CULTURE
Bacteria: NEGATIVE /hpf
Bilirubin: NEGATIVE
Blood: NEGATIVE
Glucose: NEGATIVE mg/dL
Ketone: NEGATIVE mg/dL
Leukocyte Esterase: NEGATIVE
Nitrites: NEGATIVE
Protein: NEGATIVE mg/dL
Specific gravity: 1.023 (ref 1.003–1.030)
Urobilinogen: 0.2 EU/dL (ref 0.2–1.0)
pH (UA): 8 (ref 5.0–8.0)

## 2015-12-10 LAB — TYPE & SCREEN
ABO/Rh(D): AB POS
Antibody screen: NEGATIVE

## 2015-12-10 LAB — METABOLIC PANEL, BASIC
Anion gap: 6 mmol/L (ref 5–15)
BUN/Creatinine ratio: 25 — ABNORMAL HIGH (ref 12–20)
BUN: 17 MG/DL (ref 6–20)
CO2: 31 mmol/L (ref 21–32)
Calcium: 8.9 MG/DL (ref 8.5–10.1)
Chloride: 102 mmol/L (ref 97–108)
Creatinine: 0.69 MG/DL (ref 0.55–1.02)
GFR est AA: >60 mL/min/{1.73_m2} (ref >60)
GFR est non-AA: >60 mL/min/{1.73_m2} (ref >60)
Glucose: 95 mg/dL (ref 65–100)
Potassium: 4.1 mmol/L (ref 3.5–5.1)
Sodium: 139 mmol/L (ref 136–145)

## 2015-12-10 LAB — HEMOGLOBIN A1C WITH EAG
Est. average glucose: 120 mg/dL
Hemoglobin A1c: 5.8 % (ref 4.2–6.3)

## 2015-12-10 LAB — PROTHROMBIN TIME + INR
INR: 0.9 (ref 0.9–1.1)
Prothrombin time: 9.7 s (ref 9.0–11.1)

## 2015-12-10 LAB — CBC W/O DIFF
HCT: 29.8 % — ABNORMAL LOW (ref 35.0–47.0)
HGB: 9.1 g/dL — ABNORMAL LOW (ref 11.5–16.0)
MCH: 24.5 PG — ABNORMAL LOW (ref 26.0–34.0)
MCHC: 30.5 g/dL (ref 30.0–36.5)
MCV: 80.1 FL (ref 80.0–99.0)
PLATELET: 260 10*3/uL (ref 150–400)
RBC: 3.72 M/uL — ABNORMAL LOW (ref 3.80–5.20)
RDW: 13.8 % (ref 11.5–14.5)
WBC: 4.9 10*3/uL (ref 3.6–11.0)

## 2015-12-10 NOTE — Undefined (Signed)
Preoperative instructions reviewed with patient.  Patient given 2-6 packs of CHG wipes.  Instructions reviewed on use of CHG wipes.  Patient given SSI infection FAQS sheet, as well as a  MRSA/MSSA treatment instruction sheet  With an explanation to patient that they will be notified if treatment instructions need to be initiated.  Patient was given the opportunity to ask questions on the information provided.

## 2015-12-11 LAB — CULTURE, MRSA

## 2015-12-11 NOTE — Undefined (Signed)
Faxed PAT testing reports (and fax confirmation received) to Dr. Janeece AgeeGarnett's office. Called at 1015 on 12/11/15, spoke to [place] RE: abnormal CBC, hemoglobin 9.1.

## 2015-12-16 NOTE — H&P (Addendum)
HISTORY OF PRESENT ILLNESS: Lindsay [name] comes in for evaluation of her knee. She has quite severe pain in right knee. I last saw her in November of 2016. She has had a number of steroid injections and she feels that her last injections really did not help. She does not have hip pain. Does not have symptoms of infection. Does not have symptoms of nerve impingement. She has done quite well with left total knee replacement.     PAST MEDICAL HISTORY: As per updated history form provided today history of anemia, prior blood clots, and osteoporosis.  ??  DRUG ALLERGIES: No known drug allergies.  ??  SURGICAL HISTORY: Includes prior C-sections and rotator cuff repair.  Left TKR.  ??  FAMILY HISTORY: Heart disease.  ??  SOCIAL HISTORY: The patient is a nonsmoker. Occasional beer drinker.  ??  REVIEW OF SYSTEMS: All systems are reviewed and are negative.  ??  PHYSICAL EXAMINATION: On exam Ms. Lindsay [name] is alert and oriented x 3, she is in no acute distress. Respiratory rate is 15. On exam of the lower extremities, no evidence of calf pain, DVT, cellulitis, rash or infection. On exam of the right knee she lacks 4 degrees full extension, flexes 117 degrees. There is diffuse tenderness and crepitance to range of motion of the right knee. No effusion.  ??  X-RAYS: I ordered x-rays of bilateral knees, on the right knee there is near complete loss of medial joint space narrowing. Also significant patellofemoral degenerative changes.  ??  IMPRESSION: 1.  s/p left TKR.  2.  Right knee severe DJD  ??  [name] indicates she is quite symptomatic. She has quite severe pain. She feels that she has failed a long course of conservative treatment including a number of injections.   ??  PLAN: We discussed treatment options. I feel that she will need total knee replacement. She wants to go ahead with right total knee replacement. I reviewed with her the procedure, the postop course, the postop rehab. She  tells me that she had a pulmonary embolus in 2014. She was treated with Xarelto. I would recommend Xarelto as well and she does understand the potential for DVT and pulmonary embolus. She understands all other potential complications.    PROCEDURE: Right total knee replacement.     Date of Surgery Update:  SHEINA [name] was seen and examined.  Past Medical History:   Diagnosis Date   ??? Abnormal brain scan    ??? Arthritis    ??? Chronic pain    ??? Depression    ??? Falls frequently    ??? Fracture of left ankle    ??? Headache 2011   ??? Ill-defined condition 09/15/2015    FX R HAND   ??? Other and unspecified hyperlipidemia 12/27/2012   ??? PE (pulmonary embolism) 12/2012   ??? Torn rotator cuff     right     Prior to Admission Medications   Prescriptions Last Dose Informant Patient Reported? Taking?   acetaminophen (TYLENOL) 325 mg tablet 12/16/2015 at Unknown time  Yes Yes   Sig: Take 650 mg by mouth every four (4) hours as needed for Pain.   diazePAM (VALIUM) 5 mg tablet 12/16/2015 at Unknown time  Yes Yes   Sig: Take 5 mg by mouth every eight (8) hours as needed for Anxiety.   ergocalciferol (ERGOCALCIFEROL) 50,000 unit capsule 12/14/2015  No No   Sig: Take 1 Cap by mouth every seven (7) days.   escitalopram  oxalate (LEXAPRO) 20 mg tablet 12/16/2015 at Unknown time  No Yes   Sig: Take 1 Tab by mouth daily.   Patient taking differently: Take 20 mg by mouth nightly.   ibuprofen (MOTRIN IB) 200 mg tablet 12/11/2015  Yes No   Sig: Take 400 mg by mouth every eight (8) hours as needed for Pain.   omeprazole (PRILOSEC) 20 mg capsule 12/16/2015 at Unknown time  Yes Yes   Sig: Take 20 mg by mouth nightly.   rOPINIRole (REQUIP) 1 mg tablet 12/16/2015 at Unknown time  No Yes   Sig: Take 1.5 tab 1-3 hours before bedtime      Facility-Administered Medications: None      Allergy ZO:XWRUEAto:Review of patient's allergies indicates no known allergies.  Physical Examination: General appearance - alert, well appearing, and in no distress   History and physical has been reviewed. The patient has been examined. There have been no significant clinical changes since the completion of the originally dated History and Physical.    Signed By: Asher MuirWilliam S Keyry Iracheta, PA-C     December 17, 2015 7:09 AM

## 2015-12-17 ENCOUNTER — Inpatient Hospital Stay
Admit: 2015-12-17 | Discharge: 2015-12-22 | Disposition: A | Discharge status: Home Health | Payer: BLUE CROSS/BLUE SHIELD | Attending: Orthopaedic Surgery | Admitting: Orthopaedic Surgery

## 2015-12-17 DIAGNOSIS — M1711 Unilateral primary osteoarthritis, right knee: Secondary | ICD-10-CM

## 2015-12-17 LAB — GLUCOSE, POC: Glucose (POC): 111 mg/dL — ABNORMAL HIGH (ref 65–100)

## 2015-12-17 MED ORDER — ROPIVACAINE (PF) 5 MG/ML (0.5 %) INJECTION
5 mg/mL (0. %) | INTRAMUSCULAR | Status: DC | PRN
Start: 2015-12-17 — End: 2015-12-17
  Administered 2015-12-17: 11:00:00 via PERINEURAL

## 2015-12-17 MED ORDER — MORPHINE 10 MG/ML INJ SOLUTION
10 mg/ml | INTRAMUSCULAR | Status: AC
Start: 2015-12-17 — End: ?

## 2015-12-17 MED ORDER — SODIUM CHLORIDE 0.9 % IV
50000 unit | Freq: Once | INTRAVENOUS | Status: AC
Start: 2015-12-17 — End: 2015-12-17
  Administered 2015-12-17 (×2)

## 2015-12-17 MED ORDER — BUPIVACAINE LIPOSOME (PF) 266 MG/20 ML (13.3 MG/ML) SUSP, INFILTRATION
1.3 % (13.3 mg/mL) | Freq: Once | Status: DC
Start: 2015-12-17 — End: 2015-12-17
  Administered 2015-12-17: 13:00:00

## 2015-12-17 MED ORDER — PANTOPRAZOLE 40 MG TAB, DELAYED RELEASE
40 mg | Freq: Every evening | ORAL | Status: DC
Start: 2015-12-17 — End: 2015-12-22
  Administered 2015-12-18 – 2015-12-22 (×5): via ORAL

## 2015-12-17 MED ORDER — SODIUM CHLORIDE 0.9 % IV
INTRAVENOUS | Status: DC
Start: 2015-12-17 — End: 2015-12-17
  Administered 2015-12-17: 11:00:00 via INTRAVENOUS

## 2015-12-17 MED ORDER — LACTATED RINGERS IV
INTRAVENOUS | Status: DC | PRN
Start: 2015-12-17 — End: 2015-12-17
  Administered 2015-12-17 (×2): via INTRAVENOUS

## 2015-12-17 MED ORDER — DEXAMETHASONE SODIUM PHOSPHATE 4 MG/ML IJ SOLN
4 mg/mL | INTRAMUSCULAR | Status: DC | PRN
Start: 2015-12-17 — End: 2015-12-17
  Administered 2015-12-17: 12:00:00 via INTRAVENOUS

## 2015-12-17 MED ORDER — SENNOSIDES-DOCUSATE SODIUM 8.6 MG-50 MG TAB
Freq: Two times a day (BID) | ORAL | Status: DC
Start: 2015-12-17 — End: 2015-12-22
  Administered 2015-12-18 – 2015-12-22 (×7): via ORAL

## 2015-12-17 MED ORDER — SODIUM CHLORIDE 0.9 % IJ SYRG
INTRAMUSCULAR | Status: DC | PRN
Start: 2015-12-17 — End: 2015-12-22

## 2015-12-17 MED ORDER — MIDAZOLAM 1 MG/ML IJ SOLN
1 mg/mL | INTRAMUSCULAR | Status: DC | PRN
Start: 2015-12-17 — End: 2015-12-17
  Administered 2015-12-17: 11:00:00 via INTRAVENOUS

## 2015-12-17 MED ORDER — DIPHENHYDRAMINE HCL 50 MG/ML IJ SOLN
50 mg/mL | INTRAMUSCULAR | Status: DC | PRN
Start: 2015-12-17 — End: 2015-12-17

## 2015-12-17 MED ORDER — BUPIVACAINE LIPOSOME (PF) 266 MG/20 ML (13.3 MG/ML) SUSP, INFILTRATION
1.3 % (13.3 mg/mL) | Status: AC
Start: 2015-12-17 — End: ?

## 2015-12-17 MED ORDER — PHENYLEPHRINE 10 MG/ML INJECTION
10 mg/mL | INTRAMUSCULAR | Status: DC | PRN
Start: 2015-12-17 — End: 2015-12-17
  Administered 2015-12-17 (×2): via INTRAVENOUS

## 2015-12-17 MED ORDER — POLYETHYLENE GLYCOL 3350 17 GRAM (100 %) ORAL POWDER PACKET
17 gram | Freq: Every day | ORAL | Status: DC
Start: 2015-12-17 — End: 2015-12-17

## 2015-12-17 MED ORDER — DEXAMETHASONE SODIUM PHOSPHATE (PF) 10 MG/ML INJECTION
10 mg/mL | Freq: Once | INTRAMUSCULAR | Status: DC
Start: 2015-12-17 — End: 2015-12-17
  Administered 2015-12-17: 11:00:00 via INTRAVENOUS

## 2015-12-17 MED ORDER — TRANEXAMIC ACID 1,000 MG/10 ML (100 MG/ML) IV
1000 mg/10 mL (100 mg/mL) | INTRAVENOUS | Status: DC | PRN
Start: 2015-12-17 — End: 2015-12-17
  Administered 2015-12-17: 13:00:00 via TOPICAL

## 2015-12-17 MED ORDER — LACTATED RINGERS IV
INTRAVENOUS | Status: DC
Start: 2015-12-17 — End: 2015-12-17
  Administered 2015-12-17: 14:00:00 via INTRAVENOUS

## 2015-12-17 MED ORDER — DEXAMETHASONE SODIUM PHOSPHATE (PF) 10 MG/ML INJECTION
10 mg/mL | INTRAMUSCULAR | Status: AC
Start: 2015-12-17 — End: ?

## 2015-12-17 MED ORDER — ROPIVACAINE (PF) 5 MG/ML (0.5 %) INJECTION
5 mg/mL (0. %) | INTRAMUSCULAR | Status: AC
Start: 2015-12-17 — End: ?

## 2015-12-17 MED ORDER — HYDROMORPHONE (PF) 1 MG/ML IJ SOLN
1 mg/mL | INTRAMUSCULAR | Status: AC | PRN
Start: 2015-12-17 — End: 2015-12-18
  Administered 2015-12-17: 16:00:00 via INTRAVENOUS

## 2015-12-17 MED ORDER — ROPINIROLE 1 MG TAB
1 mg | Freq: Every evening | ORAL | Status: DC
Start: 2015-12-17 — End: 2015-12-17
  Administered 2015-12-18: 01:00:00 via ORAL

## 2015-12-17 MED ORDER — CEFAZOLIN 2 GRAM/50 ML NS IVPB
INTRAVENOUS | Status: DC
Start: 2015-12-17 — End: 2015-12-17

## 2015-12-17 MED ORDER — MIDAZOLAM 1 MG/ML IJ SOLN
1 mg/mL | INTRAMUSCULAR | Status: DC | PRN
Start: 2015-12-17 — End: 2015-12-17

## 2015-12-17 MED ORDER — OXYCODONE 5 MG TAB
5 mg | ORAL | Status: DC | PRN
Start: 2015-12-17 — End: 2015-12-17

## 2015-12-17 MED ORDER — ACETAMINOPHEN 325 MG TABLET
325 mg | Freq: Once | ORAL | Status: AC
Start: 2015-12-17 — End: 2015-12-17
  Administered 2015-12-17: 11:00:00 via ORAL

## 2015-12-17 MED ORDER — LACTATED RINGERS IV
INTRAVENOUS | Status: DC
Start: 2015-12-17 — End: 2015-12-17
  Administered 2015-12-17: 11:00:00 via INTRAVENOUS

## 2015-12-17 MED ORDER — CELECOXIB 200 MG CAP
200 mg | Freq: Two times a day (BID) | ORAL | Status: DC
Start: 2015-12-17 — End: 2015-12-21
  Administered 2015-12-17 – 2015-12-21 (×8): via ORAL

## 2015-12-17 MED ORDER — ONDANSETRON (PF) 4 MG/2 ML INJECTION
4 mg/2 mL | INTRAMUSCULAR | Status: DC | PRN
Start: 2015-12-17 — End: 2015-12-17

## 2015-12-17 MED ORDER — MEPERIDINE (PF) 25 MG/ML INJ SOLUTION
25 mg/ml | INTRAMUSCULAR | Status: DC | PRN
Start: 2015-12-17 — End: 2015-12-17

## 2015-12-17 MED ORDER — FENTANYL CITRATE (PF) 50 MCG/ML IJ SOLN
50 mcg/mL | INTRAMUSCULAR | Status: AC
Start: 2015-12-17 — End: ?

## 2015-12-17 MED ORDER — SODIUM CHLORIDE 0.9 % IJ SYRG
Freq: Three times a day (TID) | INTRAMUSCULAR | Status: DC
Start: 2015-12-17 — End: 2015-12-17
  Administered 2015-12-17: 11:00:00 via INTRAVENOUS

## 2015-12-17 MED ORDER — OXYCODONE 5 MG TAB
5 mg | ORAL | Status: DC | PRN
Start: 2015-12-17 — End: 2015-12-22
  Administered 2015-12-17 – 2015-12-22 (×18): via ORAL

## 2015-12-17 MED ORDER — MIDAZOLAM 1 MG/ML IJ SOLN
1 mg/mL | INTRAMUSCULAR | Status: AC
Start: 2015-12-17 — End: ?

## 2015-12-17 MED ORDER — CEFAZOLIN 2 G IN 100 ML 0.9% NS
2 gram/100 mL | INTRAVENOUS | Status: DC | PRN
Start: 2015-12-17 — End: 2015-12-17
  Administered 2015-12-17: 12:00:00 via INTRAVENOUS

## 2015-12-17 MED ORDER — FENTANYL CITRATE (PF) 50 MCG/ML IJ SOLN
50 mcg/mL | INTRAMUSCULAR | Status: DC | PRN
Start: 2015-12-17 — End: 2015-12-17

## 2015-12-17 MED ORDER — DEXAMETHASONE SODIUM PHOSPHATE 4 MG/ML IJ SOLN
4 mg/mL | Freq: Once | INTRAMUSCULAR | Status: AC
Start: 2015-12-17 — End: 2015-12-18
  Administered 2015-12-18: 11:00:00 via INTRAVENOUS

## 2015-12-17 MED ORDER — NALOXONE 0.4 MG/ML INJECTION
0.4 mg/mL | INTRAMUSCULAR | Status: DC | PRN
Start: 2015-12-17 — End: 2015-12-22

## 2015-12-17 MED ORDER — SODIUM CHLORIDE 0.9% BOLUS IV
0.9 % | Freq: Once | INTRAVENOUS | Status: DC | PRN
Start: 2015-12-17 — End: 2015-12-22

## 2015-12-17 MED ORDER — ACETAMINOPHEN 325 MG TABLET
325 mg | Freq: Four times a day (QID) | ORAL | Status: DC
Start: 2015-12-17 — End: 2015-12-22
  Administered 2015-12-17 – 2015-12-22 (×19): via ORAL

## 2015-12-17 MED ORDER — FENTANYL CITRATE (PF) 50 MCG/ML IJ SOLN
50 mcg/mL | INTRAMUSCULAR | Status: DC | PRN
Start: 2015-12-17 — End: 2015-12-17
  Administered 2015-12-17: 11:00:00 via INTRAVENOUS

## 2015-12-17 MED ORDER — BISACODYL 10 MG RECTAL SUPPOSITORY
10 mg | Freq: Every day | RECTAL | Status: DC | PRN
Start: 2015-12-17 — End: 2015-12-22

## 2015-12-17 MED ORDER — LIDOCAINE (PF) 10 MG/ML (1 %) IJ SOLN
10 mg/mL (1 %) | INTRAMUSCULAR | Status: DC | PRN
Start: 2015-12-17 — End: 2015-12-17

## 2015-12-17 MED ORDER — SODIUM CHLORIDE 0.9 % IV
0.55 % (5 mg/mL) | Freq: Once | INTRAVENOUS | Status: AC
Start: 2015-12-17 — End: 2015-12-17
  Administered 2015-12-17 (×2): via SUBCUTANEOUS

## 2015-12-17 MED ORDER — CEFAZOLIN 2 GRAM/50 ML NS IVPB
Freq: Three times a day (TID) | INTRAVENOUS | Status: AC
Start: 2015-12-17 — End: 2015-12-18
  Administered 2015-12-17 – 2015-12-18 (×2): via INTRAVENOUS

## 2015-12-17 MED ORDER — SODIUM CHLORIDE 0.9 % IV
Freq: Once | INTRAVENOUS | Status: AC
Start: 2015-12-17 — End: 2015-12-17
  Administered 2015-12-17: 18:00:00 via INTRAVENOUS

## 2015-12-17 MED ORDER — EPHEDRINE SULFATE 50 MG/ML IJ SOLN
50 mg/mL | INTRAMUSCULAR | Status: DC | PRN
Start: 2015-12-17 — End: 2015-12-17

## 2015-12-17 MED ORDER — CELECOXIB 200 MG CAP
200 mg | Freq: Once | ORAL | Status: AC
Start: 2015-12-17 — End: 2015-12-17
  Administered 2015-12-17: 11:00:00 via ORAL

## 2015-12-17 MED ORDER — SODIUM CHLORIDE 0.9 % IV
INTRAVENOUS | Status: AC
Start: 2015-12-17 — End: 2015-12-18
  Administered 2015-12-17 – 2015-12-18 (×3): via INTRAVENOUS

## 2015-12-17 MED ORDER — PROPOFOL 10 MG/ML IV EMUL
10 mg/mL | INTRAVENOUS | Status: DC | PRN
Start: 2015-12-17 — End: 2015-12-17
  Administered 2015-12-17 (×4): via INTRAVENOUS

## 2015-12-17 MED ORDER — SODIUM CHLORIDE 0.9 % IJ SYRG
Freq: Three times a day (TID) | INTRAMUSCULAR | Status: DC
Start: 2015-12-17 — End: 2015-12-22
  Administered 2015-12-18 – 2015-12-22 (×12): via INTRAVENOUS

## 2015-12-17 MED ORDER — HYDROXYZINE 10 MG TAB
10 mg | Freq: Three times a day (TID) | ORAL | Status: DC | PRN
Start: 2015-12-17 — End: 2015-12-22

## 2015-12-17 MED ORDER — PROPOFOL 10 MG/ML IV EMUL
10 mg/mL | INTRAVENOUS | Status: DC | PRN
Start: 2015-12-17 — End: 2015-12-17
  Administered 2015-12-17 (×2): via INTRAVENOUS

## 2015-12-17 MED ORDER — ESCITALOPRAM 10 MG TAB
10 mg | Freq: Every evening | ORAL | Status: DC
Start: 2015-12-17 — End: 2015-12-22
  Administered 2015-12-18 – 2015-12-22 (×5): via ORAL

## 2015-12-17 MED ORDER — RIVAROXABAN 10 MG TAB
10 mg | Freq: Every day | ORAL | Status: DC
Start: 2015-12-17 — End: 2015-12-20
  Administered 2015-12-17 – 2015-12-19 (×3): via ORAL

## 2015-12-17 MED ORDER — SODIUM CHLORIDE 0.9 % IJ SYRG
INTRAMUSCULAR | Status: DC | PRN
Start: 2015-12-17 — End: 2015-12-17

## 2015-12-17 MED ORDER — HYDROMORPHONE (PF) 1 MG/ML IJ SOLN
1 mg/mL | INTRAMUSCULAR | Status: DC | PRN
Start: 2015-12-17 — End: 2015-12-17

## 2015-12-17 MED ORDER — FAMOTIDINE 20 MG TAB
20 mg | Freq: Two times a day (BID) | ORAL | Status: DC | PRN
Start: 2015-12-17 — End: 2015-12-22
  Administered 2015-12-18: 12:00:00 via ORAL

## 2015-12-17 MED ORDER — DIAZEPAM 5 MG TAB
5 mg | Freq: Three times a day (TID) | ORAL | Status: DC | PRN
Start: 2015-12-17 — End: 2015-12-22
  Administered 2015-12-18 – 2015-12-22 (×7): via ORAL

## 2015-12-17 MED ORDER — SENNOSIDES-DOCUSATE SODIUM 8.6 MG-50 MG TAB
Freq: Two times a day (BID) | ORAL | Status: DC
Start: 2015-12-17 — End: 2015-12-17

## 2015-12-17 MED ORDER — MORPHINE 10 MG/ML INJ SOLUTION
10 mg/ml | INTRAMUSCULAR | Status: DC | PRN
Start: 2015-12-17 — End: 2015-12-17

## 2015-12-17 MED ORDER — BUPIVACAINE (PF) 0.75 % (7.5 MG/ML) IJ SOLN
0.75 % (7.5 mg/mL) | INTRAMUSCULAR | Status: DC | PRN
Start: 2015-12-17 — End: 2015-12-17
  Administered 2015-12-17: 12:00:00 via INTRATHECAL

## 2015-12-17 MED ORDER — POLYETHYLENE GLYCOL 3350 17 GRAM (100 %) ORAL POWDER PACKET
17 gram | Freq: Every day | ORAL | Status: DC
Start: 2015-12-17 — End: 2015-12-22
  Administered 2015-12-18 – 2015-12-22 (×4): via ORAL

## 2015-12-17 MED ORDER — OXYCODONE 5 MG TAB
5 mg | ORAL | Status: DC | PRN
Start: 2015-12-17 — End: 2015-12-22
  Administered 2015-12-17 – 2015-12-21 (×15): via ORAL

## 2015-12-17 MED FILL — ACETAMINOPHEN 325 MG TABLET: 325 mg | ORAL | Qty: 2

## 2015-12-17 MED FILL — HYDROMORPHONE (PF) 1 MG/ML IJ SOLN: 1 mg/mL | INTRAMUSCULAR | Qty: 1

## 2015-12-17 MED FILL — LACTATED RINGERS IV: INTRAVENOUS | Qty: 1000

## 2015-12-17 MED FILL — CELEBREX 200 MG CAPSULE: 200 mg | ORAL | Qty: 1

## 2015-12-17 MED FILL — FENTANYL CITRATE (PF) 50 MCG/ML IJ SOLN: 50 mcg/mL | INTRAMUSCULAR | Qty: 2

## 2015-12-17 MED FILL — OXYCODONE 5 MG TAB: 5 mg | ORAL | Qty: 1

## 2015-12-17 MED FILL — OXYCODONE 5 MG TAB: 5 mg | ORAL | Qty: 2

## 2015-12-17 MED FILL — MORPHINE 10 MG/ML SYRINGE: 10 mg/mL | INTRAMUSCULAR | Qty: 1

## 2015-12-17 MED FILL — XARELTO 10 MG TABLET: 10 mg | ORAL | Qty: 1

## 2015-12-17 MED FILL — BACITRACIN 50,000 UNIT IM: 50000 unit | INTRAMUSCULAR | Qty: 50000

## 2015-12-17 MED FILL — SODIUM CHLORIDE 0.9 % IV: INTRAVENOUS | Qty: 1000

## 2015-12-17 MED FILL — DEXAMETHASONE SODIUM PHOSPHATE (PF) 10 MG/ML INJECTION: 10 mg/mL | INTRAMUSCULAR | Qty: 1

## 2015-12-17 MED FILL — MIDAZOLAM 1 MG/ML IJ SOLN: 1 mg/mL | INTRAMUSCULAR | Qty: 5

## 2015-12-17 MED FILL — EXPAREL (PF) 1.3 % (13.3 MG/ML) SUSPENSION FOR LOCAL INFILTRATION: 1.3 % (13.3 mg/mL) | Qty: 20

## 2015-12-17 MED FILL — NAROPIN (PF) 5 MG/ML (0.5 %) INJECTION SOLUTION: 5 mg/mL (0. %) | INTRAMUSCULAR | Qty: 30

## 2015-12-17 MED FILL — SODIUM CHLORIDE 0.9 % IV: INTRAVENOUS | Qty: 500

## 2015-12-17 MED FILL — PHENYLEPHRINE 10 MG/ML INJECTION: 10 mg/mL | INTRAMUSCULAR | Qty: 1

## 2015-12-17 MED FILL — BUPIVACAINE (PF) 0.75 % (7.5 MG/ML) IJ SOLN: 0.75 % (7.5 mg/mL) | INTRAMUSCULAR | Qty: 12

## 2015-12-17 MED FILL — DEXAMETHASONE SODIUM PHOSPHATE 4 MG/ML IJ SOLN: 4 mg/mL | INTRAMUSCULAR | Qty: 2

## 2015-12-17 MED FILL — DIPRIVAN 10 MG/ML INTRAVENOUS EMULSION: 10 mg/mL | INTRAVENOUS | Qty: 57.6

## 2015-12-17 MED FILL — CEFAZOLIN 2 GRAM/50 ML NS IVPB: INTRAVENOUS | Qty: 50

## 2015-12-17 MED FILL — NAROPIN (PF) 5 MG/ML (0.5 %) INJECTION SOLUTION: 5 mg/mL (0. %) | INTRAMUSCULAR | Qty: 20

## 2015-12-17 NOTE — Op Note (Signed)
[facility]   [address]   [place], [place] [id number]   OP NOTE       Name:  Lindsay BillingsRKER, Kerrilyn A   MR#:  [id number]   DOB:  [birth year]   Account #:  [id number]    Surgery Date:  12/17/2015   Date of Adm:  12/17/2015       PREOPERATIVE DIAGNOSES:   1. Advanced degenerative arthritis of right knee.   2. Status post left total knee replacement.    POSTOPERATIVE DIAGNOSES:   1. Advanced degenerative arthritis of right knee.   2. Status post left total knee replacement.    PROCEDURES PERFORMED: Right total knee replacement.    SURGEON: Mardi MainlandLockett W. Kariya Lavergne, MD    ANESTHESIA: Spinal.    ESTIMATED BLOOD LOSS: 200 mL.    DRAINS: None.    COMPLICATIONS: None.    SPECIMENS REMOVED: Tibial and femoral bone fragments.    ASSISTANT: Asher MuirWilliam S. Kernodle, PA-C, who assisted me with   positioning, retraction, implant instillation, and incision closure.    INDICATIONS: The patient has previously undergone left total knee   replacement. She now presents for right total knee replacement.    DESCRIPTION OF PROCEDURE: On the day of operation, the patient   was taken to the operating room and spinal anesthesia administered.   The right lower extremity was prepped and draped in the usual fashion.   After exsanguination with an Esmarch, tourniquet inflated to 285   mmHg.      A midline longitudinal incision was made over the knee and was   carried through subcutaneous tissue. A medial parapatellar capsular   incision made. Advanced degenerative changes were noted   throughout the knee. The knee was debrided of osteophytes and soft   tissue. A drill was used to gain access to the femoral canal. Distal   femoral cutting guide was assembled with a 5-degree valgus cut. Distal   femoral cut was made with an oscillating saw. The femur was sized   and felt to be 5 narrow in the Attune system. Anterior and posterior   cuts were made, chamfer cuts were made, and the box cut for the   posterior stabilized prosthesis made. External tibial  alignment guide   assembled. Tibial plateau cut was made with an oscillating saw.   Flexion and extension gaps were evaluated and felt to be appropriate.   The tibia was sized and felt to be a #3 in the Attune system. Drill and   punch were used to prepare the tibial plateau. The patella was   prepared with an oscillating saw and sized for 35 mm. Trial   components were put into place. The 6 mm insert gave the best fit,   range of motion, with proper alignment and proper tracking of the   patella. The ligaments were felt to be properly balanced. Trial   components were then removed.     The knee was thoroughly irrigated with pulse irrigation as well as   antibiotic solution. The components were cemented into place. The 6   mm trial insert was put into place. The soft tissues were infiltrated with   Exparel local anesthetic. After the cement had matured, it was felt that   the 6 mm insert provided the best stability and fit. The 6 mm insert was   put into place, tourniquet released, coagulation achieved with   electrocautery. Capsule repaired with #1 Vicryl suture, 2-0 Vicryl was   used  to close subcutaneous tissue and staples used to close the skin.   Sterile dressings were applied. The patient was taken to the recovery   room in satisfactory condition.        Ori Trejos W. Glendon Axe, MD      LWG / RLD   D:  12/17/2015   14:44   T:  12/17/2015   15:10   Job #:  [id number]

## 2015-12-17 NOTE — Anesthesia Procedure Notes (Signed)
Anesthesia Procedure Notes by Dorann OuBrandon, Koryn Charlot W, MD at 12/17/15 [phone]                Author: Dorann OuBrandon, Donata Reddick W, MD  Service: ANESTHESIOLOGY  Author Type: Physician       Filed: 12/17/15 0727  Date of Service: 12/17/15 0726  Status: Signed          Editor: Dorann OuBrandon, Ginelle Bays W, MD (Physician)            Procedure Orders        1. ANESTHESIA PERIPHERAL BLOCK [[id number]][[id number]] ordered by Dorann OuBrandon, Irean Kendricks W, MD                               Peripheral Block     Start time: 12/17/2015 7:20 AM  End time: 12/17/2015 7:25 AM   Performed by: Dorann OuBRANDON, Quantavius Humm W   Authorized by:  Dorann OuBRANDON, Jabaree Mercado W          Pre-procedure:   Indications: at surgeon's request and post-op pain management    Preanesthetic Checklist: patient identified, risks and benefits discussed, site marked, timeout performed  and patient being monitored        Block Type:    Block Type:  Adductor canal   Laterality:  Right   Monitoring:  Standard ASA monitoring, continuous pulse ox, frequent vital sign checks, heart rate, responsive to questions and oxygen   Injection Technique:  Single shot  Procedures: ultrasound guided     Patient Position: supine  Prep: DuraPrep     Needle Type:  Stimuplex   Needle Gauge:  22 G   Needle Localization:  Ultrasound guidance   Medication Injected:  0.5%   ropivacaine   Volume (mL):  20      Assessment:   Number of attempts:  1   Injection Assessment:  Incremental injection every 5 mL, local visualized surrounding nerve on ultrasound, negative aspiration for blood, no paresthesia, no intravascular symptoms and ultrasound image on chart   Patient tolerance:  Patient tolerated the procedure well with no immediate complications

## 2015-12-17 NOTE — Consults (Signed)
[facility]   [address]   [place], [place] [id number]   CONSULTATION       Name:  Rosalene BillingsRKER, [name]   MR#:  [id number]   DOB:  [birth year]   Account #:  [id number]    Date of Consultation:  12/17/2015   Date of Adm:  12/17/2015       DATE OF CONSULTATION: 12/17/2015.    I arrived to see the patient at 7:17.  I signed the operative site as well   as the consent. I answered questions regarding her surgery. She is   ready for right total knee replacement.         Cori Justus W. Glendon AxeGARNETT, MD      Weldon InchesLWG / SM   D:  12/17/2015   07:25   T:  12/17/2015   07:36   Job #:  [id number]

## 2015-12-17 NOTE — Anesthesia Procedure Notes (Signed)
Spinal Block    Performed by: Saahas Hidrogo W  Authorized by: Kioni Stahl W     Pre-procedure:  Indications: primary anesthetic  Preanesthetic Checklist: risks and benefits discussed and timeout performed      Spinal Block:   Patient Position:  Seated  Prep Region:  Lumbar  Prep: Betadine      Location:  L3-4  Technique:  Single shot        Needle:   Needle Type:  Pencil-tip  Needle Gauge:  24 G  Attempts:  1      Events: CSF confirmed, no blood with aspiration and no paresthesia        Assessment:  Insertion:  Uncomplicated  Patient tolerance:  Patient tolerated the procedure well with no immediate complications

## 2015-12-17 NOTE — Progress Notes (Signed)
Problem: Mobility Impaired (Adult and Pediatric)  Goal: *Acute Goals and Plan of Care (Insert Text)  Physical Therapy Goals  Initiated 12/17/2015    1. Patient will move from supine to sit and sit to supine , scoot up and down and roll side to side in bed with modified independence within 4 days.  2. Patient will perform sit to stand with modified independence within 4 days.  3. Patient will ambulate with modified independence for 150 feet with the least restrictive device within 4 days.  4. Patient will ascend/descend 5 stairs with 2 handrail(s) with modified independence within 4 days.  5. Patient will perform THR home exercise program per protocol with modified independence within 4 days.  PHYSICAL THERAPY EVALUATION  Patient: Lindsay [name] (60 y.o. female)  Date: 12/17/2015  Primary Diagnosis: DJD  Primary osteoarthritis of right knee  Procedure(s) (LRB):  RIGHT TOTAL KNEE REPLACEMENT (Right) Day of Surgery   Precautions:   WBAT      ASSESSMENT :  Based on the objective data described below, the patient presents with decreased balance without UE support and overall decreased independence following admission for R TKR POD 0.  Patient with recent L TKR 7 weeks ago.  Currently, requires CGA for bed mobility and transfers with RW.  Patient ambulated 20 feet with RW and CGA, slight step to pattern.  Patient slightly orthostatic although asymptomatic throughout session.  Patient returned to supine with CGA.  Anticipate good progress with therapy.  Recommend HHPT vs OP PT at discharge.     Patient will benefit from skilled intervention to address the above impairments.  Patient???s rehabilitation potential is considered to be Good  Factors which may influence rehabilitation potential include:            None noted           Mental ability/status           Medical condition           Home/family situation and support systems           Safety awareness           Pain tolerance/management            Other:        PLAN :  Recommendations and Planned Interventions:             Bed Mobility Training                 Neuromuscular Re-Education             Transfer Training                       Orthotic/Prosthetic Training             Gait Training                             Modalities             Therapeutic Exercises               Edema Management/Control             Therapeutic Activities                Patient and Family Training/Education             Other (comment):  Frequency/Duration: Patient will be followed by physical therapy  twice daily to address goals.  Discharge Recommendations: Home Health and Outpatient  Further Equipment Recommendations for Discharge: Owns all       SUBJECTIVE:   Patient stated ???I was just here 7 weeks ago.???      OBJECTIVE DATA SUMMARY:   HISTORY:    Past Medical History:   Diagnosis Date   ??? Abnormal brain scan     ??? Arthritis     ??? Chronic pain     ??? Depression     ??? Falls frequently     ??? Fracture of left ankle     ??? Headache 2011   ??? Ill-defined condition 09/15/2015     FX R HAND   ??? Other and unspecified hyperlipidemia 12/27/2012   ??? PE (pulmonary embolism) 12/2012   ??? Torn rotator cuff       right     Past Surgical History:   Procedure Laterality Date   ??? BREAST SURGERY PROCEDURE UNLISTED   2002     AUGMENTATION   ??? HX APPENDECTOMY       ??? HX CESAREAN SECTION         x4   ??? HX GI         COLONOSCOPY   ??? 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 HX ORTHOPAEDIC   08/2004     rotater cuff r. shoulder   ??? HX ORTHOPAEDIC         left ankle fracture treated with air cast   ??? HX ORTHOPAEDIC   6/13     left shoulder fxt and dislocation   ??? HX OTHER SURGICAL   2010     left foot torn tendon   ??? HX TUBAL LIGATION         Prior Level of Function/Home Situation: independent  Personal factors and/or comorbidities impacting plan of care:      Home Situation  Home Environment: Private residence  # Steps to Enter: 5  Rails to Enter: Yes   Hand Rails : Right  One/Two Story Residence: Two story, live on 1st floor  Lift Chair Available: No  Living Alone: No  Support Systems: Family member(s)  Patient Expects to be Discharged to:: Private residence  Current DME Used/Available at Home: Gilmer Mor, straight, Environmental consultant, rolling     EXAMINATION/PRESENTATION/DECISION MAKING:   Critical Behavior:              Hearing:  Auditory  Auditory Impairment: None  Skin:    Edema:   Range Of Motion:  AROM: Generally decreased, functional           PROM: Generally decreased, functional           Strength:    Strength: Generally decreased, functional                    Tone & Sensation:                                  Coordination:     Vision:      Functional Mobility:  Bed Mobility:     Supine to Sit: Contact guard assistance  Sit to Supine: Contact guard assistance     Transfers:  Sit to Stand: Contact guard assistance  Stand to Sit: Contact guard assistance  Stand Pivot Transfers: Contact guard assistance  Balance:   Sitting: Intact  Standing: Intact;With support  Ambulation/Gait Training:  Distance (ft): 20 Feet (ft)  Assistive Device: Gait belt;Walker, rolling  Ambulation - Level of Assistance: Contact guard assistance        Gait Abnormalities: Antalgic;Decreased step clearance;Step to gait  Right Side Weight Bearing: As tolerated     Base of Support: Narrowed  Stance: Right decreased  Speed/Cadence: Pace decreased (<100 feet/min)  Step Length: Right shortened;Left shortened                                           Stairs:                          Therapeutic Exercises:   Ankle pumps, quad sets, heel slides, SLR x 10     Functional Measure:  Tinetti test:      Sitting Balance: 1  Arises: 1  Attempts to Rise: 2  Immediate Standing Balance: 1  Standing Balance: 1  Nudged: 1  Eyes Closed: 1  Turn 360 Degrees - Continuous/Discontinuous: 0  Turn 360 Degrees - Steady/Unsteady: 0  Sitting Down: 1  Balance Score: 9  Indication of Gait: 1  R Step Length/Height: 1   L Step Length/Height: 1  R Foot Clearance: 1  L Foot Clearance: 1  Step Symmetry: 1  Step Continuity: 1  Path: 1  Trunk: 1  Walking Time: 0  Gait Score: 9  Total Score: 18         Tinetti Test and G-code impairment scale:  Percentage of Impairment CH     0%    CI     1-19% CJ     20-39% CK     40-59% CL     60-79% CM     80-99% CN      100%   Tinetti  Score 0-28 28 23-27 17-22 12-16 6-11 1-5 0          Tinetti Tool Score Risk of Falls  <19 = High Fall Risk  19-24 = Moderate Fall Risk  25-28 = Low Fall Risk  Tinetti ME. Performance-Oriented Assessment of Mobility Problems in Elderly Patients. JAGS 1986; [id number]. (Scoring Description: PT Bulletin Feb. 10, 1993)     Older adults: Lonn Georgia et al, 2009; n = 1000 [place] elderly evaluated with ABC, POMA, ADL, and IADL)  ?? Mean POMA score for males aged 65-79 years = 26.21(3.40)  ?? Mean POMA score for females age 72-79 years = 25.16(4.30)  ?? Mean POMA score for males over 80 years = 23.29(6.02)  ?? Mean POMA score for females over 80 years = 17.20(8.32)            G codes:  In compliance with CMS???s Claims Based Outcome Reporting, the following G-code set was chosen for this patient based on their primary functional limitation being treated:     The outcome measure chosen to determine the severity of the functional limitation was the Tinetti with a score of 18/28 which was correlated with the impairment scale.      ?? Mobility - Walking and Moving Around:              Z6109 - CURRENT STATUS:    CJ - 20%-39% impaired, limited or restricted              U0454 - GOAL  STATUS:           CI - 1%-19% impaired, limited or restricted              [id number] - D/C STATUS:                       ---------------To be determined---------------         Based on the above components, the patient evaluation is determined to be of the following complexity level: LOW      Pain:  Pain Scale 1: Numeric (0 - 10)  Pain Intensity 1: 4  Pain Location 1: Knee  Pain Orientation 1: Right   Pain Description 1: Aching  Pain Intervention(s) 1: Medication (see MAR)  Activity Tolerance:   + orthostatic hypotension, asymptomatic  Please refer to the flowsheet for vital signs taken during this treatment.  After treatment:   [ ]          Patient left in no apparent distress sitting up in chair  [X]          Patient left in no apparent distress in bed  [X]          Call bell left within reach  [X]          Nursing notified  [X]          Caregiver present  [ ]          Bed alarm activated      COMMUNICATION/EDUCATION:   The patient???s plan of care was discussed with: Registered Nurse.  [X]          Fall prevention education was provided and the patient/caregiver indicated understanding.  [X]          Patient/family have participated as able in goal setting and plan of care.  [X]          Patient/family agree to work toward stated goals and plan of care.  [ ]          Patient understands intent and goals of therapy, but is neutral about his/her participation.  [ ]          Patient is unable to participate in goal setting and plan of care.     Thank you for this referral.  Rennis HardingAshley C Crawford, PT   Time Calculation: 16 mins

## 2015-12-17 NOTE — Other (Signed)
Update given to family per MD request at [phone].

## 2015-12-17 NOTE — Anesthesia Procedure Notes (Signed)
Spinal Block    Performed by: Twylia Oka W  Authorized by: Lucrezia Dehne W     Pre-procedure:  Indications: primary anesthetic  Preanesthetic Checklist: risks and benefits discussed and timeout performed      Spinal Block:   Patient Position:  Seated  Prep Region:  Lumbar  Prep: Betadine      Location:  L3-4  Technique:  Single shot        Needle:   Needle Type:  Pencil-tip  Needle Gauge:  24 G  Attempts:  1      Events: CSF confirmed, no blood with aspiration and no paresthesia        Assessment:  Insertion:  Uncomplicated  Patient tolerance:  Patient tolerated the procedure well with no immediate complications

## 2015-12-17 NOTE — Progress Notes (Signed)
NP ORTHO PROGRESS NOTE  Post Op day: Day of Surgery    December 17, 2015 2:39 PM     Lindsay [name]  [id number]  female  60 y.o.   03/18/1956    Admit date: 12/17/2015  Date of Surgery: 12/17/2015   Procedures: Procedure(s):  RIGHT TOTAL KNEE REPLACEMENT  Admitting Physician: Mardi MainlandLockett W [name], MD   Surgeon: Moishe SpiceSurgeon(s) and Role:     Mardi Mainland* Lockett W [name], MD - Primary    Chart/Meds/Labs Reviewed  Current Facility-Administered Medications   Medication Dose Route Frequency   ??? 0.9% sodium chloride infusion  125 mL/hr IntraVENous CONTINUOUS   ??? sodium chloride 0.9 % bolus infusion 500 mL  500 mL IntraVENous ONCE PRN   ??? [START ON 12/18/2015] sodium chloride (NS) flush 5-10 mL  5-10 mL IntraVENous Q8H   ??? sodium chloride (NS) flush 5-10 mL  5-10 mL IntraVENous PRN   ??? acetaminophen (TYLENOL) tablet 650 mg  650 mg Oral Q6H   ??? oxyCODONE IR (ROXICODONE) tablet 5 mg  5 mg Oral Q3H PRN   ??? oxyCODONE IR (ROXICODONE) tablet 10 mg  10 mg Oral Q3H PRN   ??? celecoxib (CELEBREX) capsule 200 mg  200 mg Oral BID   ??? HYDROmorphone (PF) (DILAUDID) injection 0.5 mg  0.5 mg IntraVENous Q4H PRN   ??? naloxone (NARCAN) injection 0.4 mg  0.4 mg IntraVENous PRN   ??? ceFAZolin in 0.9% NS (ANCEF) IVPB soln 2 g  2 g IntraVENous Q8H   ??? [START ON 12/18/2015] dexamethasone (DECADRON) 4 mg/mL injection 10 mg  10 mg IntraVENous ONCE   ??? hydrOXYzine HCl (ATARAX) tablet 10 mg  10 mg Oral Q8H PRN   ??? famotidine (PEPCID) tablet 20 mg  20 mg Oral BID PRN   ??? [START ON 12/19/2015] bisacodyl (DULCOLAX) suppository 10 mg  10 mg Rectal DAILY PRN   ??? rivaroxaban (XARELTO) tablet 10 mg  10 mg Oral DAILY WITH DINNER   ??? diazePAM (VALIUM) tablet 5 mg  5 mg Oral Q8H PRN   ??? escitalopram oxalate (LEXAPRO) tablet 20 mg  20 mg Oral QHS   ??? pantoprazole (PROTONIX) tablet 40 mg  40 mg Oral QHS   ??? rOPINIRole (REQUIP) tablet 1 mg  1 mg Oral QPM   ??? [START ON 12/18/2015] senna-docusate (PERICOLACE) 8.6-50 mg per tablet 1 Tab  1 Tab Oral BID    ??? [START ON 12/18/2015] polyethylene glycol (MIRALAX) packet 17 g  17 g Oral DAILY       Subjective:    Complaints: None  Denies Dizziness, CP, SOB, N/V, Abdominal pain, numbness or tingling of extremities. Had loose BM this morning--thinks it was her nerves. Able to perform ankle pumps easily.  Had Left TKA ~7weeks ago.    Incisional pain control: well controlled with present plan.  Pain Control:   Pain Assessment  Pain Scale 1: Numeric (0 - 10)  Pain Intensity 1: 4  Pain Location 1: Knee  Pain Orientation 1: Right  Pain Description 1: Aching  Pain Intervention(s) 1: Medication (see MAR)    Oral diet: Tolerating diet well thus far.    Objective:  General: Alert,Ox4, cooperative, NAD. Daughter at [facility].  HEENT: Atraumatic, PERRL, anicteric sclerae  Lungs: Bilateral expansion. Equal excursion. No accessory muscle use.   Gastrointestinal:  Soft, non-tender, non-distended  Extremities:  Neurovasc exam WDL. + DP pulses.           Sensation intact to light touch.  Motor: + DF/PF          Calves non-tender upon palpation or with passive stretch.          No significant erythema or swelling.  Ace-wrapped Bulky Dressing: clean, dry and intact.    Vital Signs:   Visit Vitals   ??? BP 95/59   ??? Pulse 84   ??? Temp 97.6 ??F (36.4 ??C)   ??? Resp 16   ??? Ht 5\' 3"  (1.6 m)   ??? Wt 72.6 kg (160 lb)   ??? SpO2 96%   ??? BMI 28.34 kg/m2    O2 Flow Rate (L/min): 2 l/min O2 Device: Room air   Patient Vitals for the past 24 hrs:   BP Temp Pulse Resp SpO2 Height Weight   12/17/15 1257 95/59 97.6 ??F (36.4 ??C) 84 16 96 % - -   12/17/15 1137 117/76 97.5 ??F (36.4 ??C) 65 15 99 % - -   12/17/15 1045 111/67 - 60 14 100 % - -   12/17/15 1030 117/69 97.5 ??F (36.4 ??C) (!) 57 10 100 % - -   12/17/15 1020 101/64 - 62 9 100 % - -   12/17/15 1015 (!) 88/58 - 66 13 97 % - -   12/17/15 1010 99/56 - 67 14 96 % - -   12/17/15 1005 91/58 - 65 11 97 % - -   12/17/15 1000 97/63 - 70 14 96 % - -   12/17/15 0958 95/60 97.6 ??F (36.4 ??C) 75 12 95 % - -    12/17/15 0725 106/59 - 62 16 99 % - -   12/17/15 0640 127/71 98.3 ??F (36.8 ??C) 69 18 98 % 5\' 3"  (1.6 m) 72.6 kg (160 lb)     Temp (24hrs), Avg:97.7 ??F (36.5 ??C), Min:97.5 ??F (36.4 ??C), Max:98.3 ??F (36.8 ??C)      Intake/output-last 8 hours:   06/14 0701 - 06/14 1900  In: 1500 [I.V.:1500]  Out: 900 [Urine:875]    Intake/output- 24 hours:       LAB:   Recent Results (from the past 24 hour(s))   GLUCOSE, POC    Collection Time: 12/17/15  7:02 AM   Result Value Ref Range    Glucose (POC) 111 (H) 65 - 100 mg/dL    Performed by Caleb Popp       Lab Results   Component Value Date/Time    INR 0.9 12/10/2015 10:43 AM    INR 1.0 10/27/2015 08:43 AM    INR 0.9 02/05/2014 02:59 AM    INR 0.9 12/07/2013 12:34 PM     Lab Results   Component Value Date/Time    HGB 9.1 12/10/2015 10:43 AM    HGB 7.5 10/31/2015 02:23 AM    HGB 8.2 10/30/2015 03:45 AM    HGB 10.4 10/27/2015 08:43 AM     No results for input(s): NA, K, CL, BUN, CREA, GLU, CA, MG, PHOS, ALB, TBIL, TBILI, SGOT in the last 72 hours.    No lab exists for component: ALT;3    PT/OT:   Gait:                    PATIENT MOBILITY                         Assessment and Plan    Principal Problem:    Primary osteoarthritis of right knee (12/16/2015)  POD#0Procedure(s):  RIGHT TOTAL KNEE REPLACEMENT  Note Anemia  PTA--s/p L TKA ~7 weeks ago.  No indications of bleeding.   Watch hemodynamics with position change. Labs trending as expected.  VTE prophylaxis: Xarelto to start this evening, Mobilization, Ankle pumping exercises, SCDs per order if not able to exercise or mobilize well.   Weight bearing:  WBAT  Pain management:  Multi-modal pain plan, see above for medication,  Ice packs & elevation of extremity per orders, active gentle ROM & mobilize frequently for short periods of time.   PT this evening  Wound benign. Neurovascularly intact.  Progressing as expected. Continue present plans per orthopedic attending surgeon, Dr. Glendon Axe, &  interdisciplinary team for joint replacement.        Signed By: Nelta Numbers, NP    RN, MSN, MA, Adult NP-BC

## 2015-12-17 NOTE — Anesthesia Pre-Procedure Evaluation (Signed)
Anesthetic History   No history of anesthetic complications            Review of Systems / Medical History  Patient summary reviewed, nursing notes reviewed and pertinent labs reviewed    Pulmonary  Within defined limits                 Neuro/Psych   Within defined limits           Cardiovascular  Within defined limits                  Comments: Hx PE   GI/Hepatic/Renal  Within defined limits              Endo/Other        Arthritis     Other Findings              Physical Exam    Airway  Mallampati: II  TM Distance: > 6 cm  Neck ROM: normal range of motion   Mouth opening: Normal     Cardiovascular  Regular rate and rhythm,  S1 and S2 normal,  no murmur, click, rub, or gallop             Dental  No notable dental hx       Pulmonary  Breath sounds clear to auscultation               Abdominal  GI exam deferred       Other Findings            Anesthetic Plan    ASA: 2  Anesthesia type: MAC and spinal      Post-op pain plan if not by surgeon: peripheral nerve block single    Induction: Intravenous  Anesthetic plan and risks discussed with: Patient

## 2015-12-17 NOTE — Brief Op Note (Signed)
BRIEF OPERATIVE NOTE    Date of Procedure: 12/17/2015   Preoperative Diagnosis: DJD  Postoperative Diagnosis: RIGHT KNEE DEGENERATIVE JOINT DISEASE    Procedure(s):  RIGHT TOTAL KNEE REPLACEMENT  Surgeon(s) and Role:     * Mardi MainlandLockett W [name], MD - Primary         Assistant Staff:  Physician Assistant: Asher MuirWilliam S Brack Shaddock, PA-C    Surgical Staff:  Circ-1: Nicholes StairsJessica G Ooms, RN  Circ-Relief: Para MarchHilary H Butry, RN  Physician Assistant: Asher MuirWilliam S Mialynn Shelvin, PA-C  Scrub RN-1: Ranae PalmsBetsy A Parrish, RN  Scrub RN - Intern: Emi HolesLianne T Richards  Surg Asst-1: Francisco Rojas-Friol  Event Time In   Incision Start [phone]   Incision Close [phone]     Anesthesia: Spinal   Estimated Blood Loss: 200 mL  Specimens:   ID Type Source Tests Collected by Time Destination   1 : RIGHT KNEE BONE AND TISSUE    Mardi MainlandLockett W [name], MD 12/17/2015 0830 Discarded      Findings: DJD Right knee   Complications: None.  Implants:   Implant Name Type Inv. Item Serial No. Manufacturer Lot No. LRB No. Used Action   CEMENT BNE SMARTSET HV 40GM -- SMARTSET - SN/A  CEMENT BNE SMARTSET HV 40GM -- SMARTSET N/A [employer] [id number] Right 2 Implanted   PAT CEM DOME MEDIAL 35MM -- ATTUNE - SNA  PAT CEM DOME MEDIAL 35MM -- ATTUNE NA [employer] [id number] Right 1 Implanted   FEM PS NAR SZ 5 RT CEM -- ATTUNE - SNA  FEM PS NAR SZ 5 RT CEM -- ATTUNE NA [employer] [id number] Right 1 Implanted   BASE TIB FB SZ 3 CEM -- ATTUNE - SNA  BASE TIB FB SZ 3 CEM -- ATTUNE NA [employer] [id number] Right 1 Implanted   INSERT TIB FB PS SZ 5 6MM -- ATTUNE - SNA   INSERT TIB FB PS SZ 5 6MM -- ATTUNE NA [employer] [id number] Right 1 Implanted

## 2015-12-17 NOTE — Progress Notes (Signed)
TRANSFER - IN REPORT:    Verbal report received from Michelle(name) on Lindsay BillingsKathy A [name]  being [facility] from [facility] for routine post - op      Report consisted of patient???s Situation, Background, Assessment and   Recommendations(SBAR).     Information from the following report(s) SBAR, Kardex, OR Summary and MAR was reviewed with the receiving nurse.    Opportunity for questions and clarification was provided.      Assessment completed upon patient???s arrival to unit and care assumed.

## 2015-12-17 NOTE — Consults (Signed)
[name]   [address] [place] [address]   CONSULTATION       Name:  [name], Lindsay A   MR#:  [id number]   DOB:  07/04/1956   Account #:  [id number]    Date of Consultation:  12/17/2015   Date of Adm:  12/17/2015       DATE OF CONSULTATION: 12/17/2015.    I arrived to see the patient at 7:17.  I signed the operative site as well   as the consent. I answered questions regarding her surgery. She is   ready for right total knee replacement.         Tonie Elsey W. Markita Stcharles, MD      LWG / SM   D:  12/17/2015   07:25   T:  12/17/2015   07:36   Job #:  [id number]

## 2015-12-17 NOTE — Anesthesia Procedure Notes (Signed)
Peripheral Block    Start time: 12/17/2015 7:20 AM  End time: 12/17/2015 7:25 AM  Performed by: Dorann OuBRANDON, Mailey Landstrom W  Authorized by: Dorann OuBRANDON, Michael Ventresca W       Pre-procedure:   Indications: at surgeon's request and post-op pain management    Preanesthetic Checklist: patient identified, risks and benefits discussed, site marked, timeout performed and patient being monitored      Block Type:   Block Type:  Adductor canal  Laterality:  Right  Monitoring:  Standard ASA monitoring, continuous pulse ox, frequent vital sign checks, heart rate, responsive to questions and oxygen  Injection Technique:  Single shot  Procedures: ultrasound guided    Patient Position: supine  Prep: DuraPrep    Needle Type:  Stimuplex  Needle Gauge:  22 G  Needle Localization:  Ultrasound guidance  Medication Injected:  0.5%  ropivacaine  Volume (mL):  20    Assessment:  Number of attempts:  1  Injection Assessment:  Incremental injection every 5 mL, local visualized surrounding nerve on ultrasound, negative aspiration for blood, no paresthesia, no intravascular symptoms and ultrasound image on chart  Patient tolerance:  Patient tolerated the procedure well with no immediate complications

## 2015-12-17 NOTE — Op Note (Signed)
Lindsay [name]. [facility]   [address]   [place], [place] [id number]   OP NOTE       Name:  Lindsay BillingsRKER, Lindsay A   MR#:  [id number]   DOB:  1955-07-07   Account #:  [id number]    Surgery Date:  12/17/2015   Date of Adm:  12/17/2015       PREOPERATIVE DIAGNOSES:   1. Advanced degenerative arthritis of right knee.   2. Status post left total knee replacement.    POSTOPERATIVE DIAGNOSES:   1. Advanced degenerative arthritis of right knee.   2. Status post left total knee replacement.    PROCEDURES PERFORMED: Right total knee replacement.    SURGEON: Mardi MainlandLockett W. Courtney Fenlon, MD    ANESTHESIA: Spinal.    ESTIMATED BLOOD LOSS: 200 mL.    DRAINS: None.    COMPLICATIONS: None.    SPECIMENS REMOVED: Tibial and femoral bone fragments.    ASSISTANT: Asher MuirWilliam S. [employer], PA-C, who assisted me with   positioning, retraction, implant instillation, and incision closure.    INDICATIONS: The patient has previously undergone left total knee   replacement. She now presents for right total knee replacement.    DESCRIPTION OF PROCEDURE: On the day of operation, the patient   was taken to the operating room and spinal anesthesia administered.   The right lower extremity was prepped and draped in the usual fashion.   After exsanguination with an Esmarch, tourniquet inflated to 285   mmHg.      A midline longitudinal incision was made over the knee and was   carried through subcutaneous tissue. A medial parapatellar capsular   incision made. Advanced degenerative changes were noted   throughout the knee. The knee was debrided of osteophytes and soft   tissue. A drill was used to gain access to the femoral canal. Distal   femoral cutting guide was assembled with a 5-degree valgus cut. Distal   femoral cut was made with an oscillating saw. The femur was sized   and felt to be 5 narrow in the Attune system. Anterior and posterior   cuts were made, chamfer cuts were made, and the box cut for the    posterior stabilized prosthesis made. External tibial alignment guide   assembled. Tibial plateau cut was made with an oscillating saw.   Flexion and extension gaps were evaluated and felt to be appropriate.   The tibia was sized and felt to be a #3 in the Attune system. Drill and   punch were used to prepare the tibial plateau. The patella was   prepared with an oscillating saw and sized for 35 mm. Trial   components were put into place. The 6 mm insert gave the best fit,   range of motion, with proper alignment and proper tracking of the   patella. The ligaments were felt to be properly balanced. Trial   components were then removed.     The knee was thoroughly irrigated with pulse irrigation as well as   antibiotic solution. The components were cemented into place. The 6   mm trial insert was put into place. The soft tissues were infiltrated with   Exparel local anesthetic. After the cement had matured, it was felt that   the 6 mm insert provided the best stability and fit. The 6 mm insert was   put into place, tourniquet released, coagulation achieved with   electrocautery. Capsule repaired with #1 Vicryl suture, 2-0 Vicryl was   used  to close subcutaneous tissue and staples used to close the skin.   Sterile dressings were applied. The patient was taken to the recovery   room in satisfactory condition.        [name] W. Glendon Axe, MD      LWG / RLD   D:  12/17/2015   14:44   T:  12/17/2015   15:10   Job #:  [id number]

## 2015-12-17 NOTE — Progress Notes (Signed)
Patient stated she has an order for mg Requip but stated she usually take 1.5 mg. Per home medications she takes 1,5. Dr Pennie BanterKump notified and he gave an order to give her 1.mg.

## 2015-12-17 NOTE — Other (Signed)
PACU HANDOFF:    Patient: Lindsay [name] MRN: [id number]  SSN: [id number]   Date of Birth: 05/23/1956  Age: 60 y.o.  Sex: female     Patient is status post Procedure(s):  RIGHT TOTAL KNEE REPLACEMENT.    Surgeon(s) and Role:     * Mardi MainlandLockett W [name], MD - Primary    Local/Dose/Irrigation: LOCAL MIX; 10 MG MORPHINE, 20 ML EXPAREL, 20 ML NACL AND 30 ML 0.5% BUPIVACAINE PLAIN  1 G TRANEXAMIC ACID & 10 ML NACL APPLIED TOPICALLY AT THE END OF THE PROCEDURE                Peripheral IV 12/17/15 Left Hand (Active)   Dressing Status Clean, dry, & intact;New 12/17/2015  7:00 AM   Dressing Type Tape;Transparent 12/17/2015  7:00 AM   Hub Color/Line Status Pink;Infusing 12/17/2015  7:00 AM                     Dressing/Packing:  Wound Knee Right-DRESSING TYPE: Cast padding;Elastic bandage;Silver products (AQUACEL) (12/17/15 0700)  Splint/Cast:  ]    Other: 16 FR FOLEY CATHETER, TED HOSE AND SCD ON LEFT LEG

## 2015-12-17 NOTE — Other (Signed)
TRANSFER - OUT REPORT:    Verbal report given to [facility] on Rosalene BillingsKathy A Leming  being transferred to room 566 for routine post - op       Report consisted of patient???s Situation, Background, Assessment and   Recommendations(SBAR).     Time Pre op antibiotic given:2 gram ancef  Anesthesia Stop time: 0959  Foley Present on Transfer to floor:yes  Order for Foley on Chart:yes    Information from the following report(s) SBAR, OR Summary, Intake/Output and MAR was reviewed with the receiving nurse.    Opportunity for questions and clarification was provided.     Is the patient on 02? NO       L/Min        Other     Is the patient on a monitor? NO    Is the nurse transporting with the patient? NO    Surgical Waiting Area notified of patient's transfer from PACU? YES      The following personal items collected during your admission accompanied patient upon transfer:   Dental Appliance: Dental Appliances: None  Vision:    Hearing Aid:    Jewelry: Jewelry: None  Clothing: Clothing:  (bag of clothing signed in)  Other Valuables: Other Valuables: None  Valuables sent to safe:

## 2015-12-17 NOTE — Anesthesia Post-Procedure Evaluation (Signed)
Post-Anesthesia Evaluation and Assessment    Patient: Lindsay [name] MRN: [id number]  SSN: [id number]    Date of Birth: 04/16/1956  Age: 60 y.o.  Sex: female       Cardiovascular Function/Vital Signs  Visit Vitals   ??? BP 111/67   ??? Pulse 60   ??? Temp 36.4 ??C (97.5 ??F)   ??? Resp 14   ??? Ht 5\' 3"  (1.6 m)   ??? Wt 72.6 kg (160 lb)   ??? SpO2 100%   ??? BMI 28.34 kg/m2       Patient is status post MAC, spinal anesthesia for Procedure(s):  RIGHT TOTAL KNEE REPLACEMENT.    Nausea/Vomiting: None    Postoperative hydration reviewed and adequate.    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 No concerns    Signed By: Dorann OuMary W Emberli Ballester, MD     December 17, 2015

## 2015-12-17 NOTE — Other (Signed)
HUSBAND UPDATED BY Carlyon ProwsJ. OOMS, RN AT 0812--INFORMED OF SURGERY START TIME AND PATIENT'S WELL-BEING.

## 2015-12-18 LAB — METABOLIC PANEL, BASIC
Anion gap: 9 mmol/L (ref 5–15)
BUN/Creatinine ratio: 15 (ref 12–20)
BUN: 9 MG/DL (ref 6–20)
CO2: 24 mmol/L (ref 21–32)
Calcium: 7.7 MG/DL — ABNORMAL LOW (ref 8.5–10.1)
Chloride: 103 mmol/L (ref 97–108)
Creatinine: 0.59 MG/DL (ref 0.55–1.02)
GFR est AA: >60 mL/min/{1.73_m2} (ref >60)
GFR est non-AA: >60 mL/min/{1.73_m2} (ref >60)
Glucose: 124 mg/dL — ABNORMAL HIGH (ref 65–100)
Potassium: 3.5 mmol/L (ref 3.5–5.1)
Sodium: 136 mmol/L (ref 136–145)

## 2015-12-18 LAB — HEMOGLOBIN: HGB: 6.9 g/dL — ABNORMAL LOW (ref 11.5–16.0)

## 2015-12-18 MED ORDER — ROPINIROLE 1 MG TAB
1 mg | Freq: Every evening | ORAL | Status: DC
Start: 2015-12-18 — End: 2015-12-22
  Administered 2015-12-18 – 2015-12-21 (×5): via ORAL

## 2015-12-18 MED ORDER — OXYCODONE 5 MG TAB
5 mg | ORAL_TABLET | ORAL | 0 refills | Status: DC | PRN
Start: 2015-12-18 — End: 2016-03-05

## 2015-12-18 MED ORDER — ONDANSETRON (PF) 4 MG/2 ML INJECTION
4 mg/2 mL | INTRAMUSCULAR | Status: DC | PRN
Start: 2015-12-18 — End: 2015-12-22
  Administered 2015-12-18: 06:00:00 via INTRAVENOUS

## 2015-12-18 MED ORDER — CELECOXIB 200 MG CAP
200 mg | ORAL_CAPSULE | Freq: Every day | ORAL | 0 refills | Status: AC
Start: 2015-12-18 — End: 2016-01-17

## 2015-12-18 MED FILL — ONDANSETRON (PF) 4 MG/2 ML INJECTION: 4 mg/2 mL | INTRAMUSCULAR | Qty: 2

## 2015-12-18 MED FILL — FAMOTIDINE 20 MG TAB: 20 mg | ORAL | Qty: 1

## 2015-12-18 MED FILL — CELEBREX 200 MG CAPSULE: 200 mg | ORAL | Qty: 1

## 2015-12-18 MED FILL — XARELTO 10 MG TABLET: 10 mg | ORAL | Qty: 1

## 2015-12-18 MED FILL — OXYCODONE 5 MG TAB: 5 mg | ORAL | Qty: 1

## 2015-12-18 MED FILL — OXYCODONE 5 MG TAB: 5 mg | ORAL | Qty: 2

## 2015-12-18 MED FILL — SENNA PLUS 8.6 MG-50 MG TABLET: ORAL | Qty: 1

## 2015-12-18 MED FILL — BD POSIFLUSH NORMAL SALINE 0.9 % INJECTION SYRINGE: INTRAMUSCULAR | Qty: 10

## 2015-12-18 MED FILL — ACETAMINOPHEN 325 MG TABLET: 325 mg | ORAL | Qty: 2

## 2015-12-18 MED FILL — POLYETHYLENE GLYCOL 3350 17 GRAM (100 %) ORAL POWDER PACKET: 17 gram | ORAL | Qty: 1

## 2015-12-18 MED FILL — DEXAMETHASONE SODIUM PHOSPHATE 4 MG/ML IJ SOLN: 4 mg/mL | INTRAMUSCULAR | Qty: 3

## 2015-12-18 MED FILL — CEFAZOLIN 2 GRAM/50 ML NS IVPB: INTRAVENOUS | Qty: 50

## 2015-12-18 MED FILL — PANTOPRAZOLE 40 MG TAB, DELAYED RELEASE: 40 mg | ORAL | Qty: 1

## 2015-12-18 MED FILL — DIAZEPAM 5 MG TAB: 5 mg | ORAL | Qty: 1

## 2015-12-18 MED FILL — ROPINIROLE 1 MG TAB: 1 mg | ORAL | Qty: 2

## 2015-12-18 MED FILL — ESCITALOPRAM 10 MG TAB: 10 mg | ORAL | Qty: 2

## 2015-12-18 MED FILL — ROPINIROLE 1 MG TAB: 1 mg | ORAL | Qty: 1

## 2015-12-18 NOTE — Progress Notes (Signed)
Bedside and Verbal shift change report given to Joel (oncoming nurse) by Aminata Fornah, RN (offgoing nurse).  Report given with SBAR, Kardex and MAR.

## 2015-12-18 NOTE — Progress Notes (Signed)
Problem: Mobility Impaired (Adult and Pediatric)  Goal: *Acute Goals and Plan of Care (Insert Text)  Physical Therapy Goals  Initiated 12/17/2015    1. Patient will move from supine to sit and sit to supine , scoot up and down and roll side to side in bed with modified independence within 4 days.  2. Patient will perform sit to stand with modified independence within 4 days.  3. Patient will ambulate with modified independence for 150 feet with the least restrictive device within 4 days.  4. Patient will ascend/descend 5 stairs with 2 handrail(s) with modified independence within 4 days.  5. Patient will perform THR home exercise program per protocol with modified independence within 4 days.   PHYSICAL THERAPY TREATMENT  Patient: Lindsay [name] ([age] y.o. female)  Date: 12/18/2015  Diagnosis: DJD  Primary osteoarthritis of right knee Primary osteoarthritis of right knee  Procedure(s) (LRB):  RIGHT TOTAL KNEE REPLACEMENT (Right) 1 Day Post-Op  Precautions: Fall, WBAT (low BP at baseline per patient report)      ASSESSMENT:  Patient cleared to see by nursing. Patient received in supine with spouse in room and was agreeable to therapy. Patient c/o similar calf pain noted this morning. Patient appeared pale at rest in bed but c/o no lightheadedness or dizziness. Patient supervision with bed mobility following TKR exercises. Patient continues to demonstrate antalgic gait on the R with decreased stance time. As gait progressed patient stated that calf pain improved. Patient left in chair with call bell within reach and encouraged to stay in chair for at least 30 minutes as able. Report given to nursing. Continue to recommend HHPT at time of discharge.   Progression toward goals:  [X]       Improving appropriately and progressing toward goals  [ ]       Improving slowly and progressing toward goals  [ ]       Not making progress toward goals and plan of care will be adjusted       PLAN:   Patient continues to benefit from skilled intervention to address the above impairments.  Continue treatment per established plan of care.  Discharge Recommendations:  Home Health PT  Further Equipment Recommendations for Discharge:  none       SUBJECTIVE:   "My calf feels better" after walking several feet      OBJECTIVE DATA SUMMARY:   Range of Motion:           RLE AROM  R Knee Flexion: 95  R Knee Extension: -12  RLE PROM  R Knee Flexion: 102  R Knee Extension: -20           Functional Mobility Training:  Bed Mobility:     Supine to Sit: Supervision  Sit to Supine: Supervision           Transfers:  Sit to Stand: Contact guard assistance;Assist x1  Stand to Sit: Contact guard assistance;Assist x1                             Ambulation/Gait Training:  Distance (ft): 90 Feet (ft)  Assistive Device: Gait belt;Walker, rolling  Ambulation - Level of Assistance: Contact guard assistance;Assist x1        Gait Abnormalities: Antalgic;Decreased step clearance  Right Side Weight Bearing: As tolerated     Base of Support: Narrowed  Stance: Right decreased  Speed/Cadence: Pace decreased (<100 feet/min)  Step Length: Left shortened;Right shortened  Therapeutic Exercises:   Exercises performed per protocol.  See morning treatment note for description.  Pain:  Pain Scale 1: Numeric (0 - 10)  Pain Intensity 1: 8  Pain Location 1: Knee  Pain Orientation 1: Right  Pain Description 1: Aching  Pain Intervention(s) 1: Mobility  Activity Tolerance:   Patient's face appeared pale following treatment. VSS stable taken by pct  Please refer to the flowsheet for vital signs taken during this treatment.  After treatment:    Patient left in no apparent distress sitting up in chair   Patient left in no apparent distress in bed   Call bell left within reach   Nursing notified   Caregiver present   Bed alarm activated      COMMUNICATION/COLLABORATION:    The patient???s plan of care was discussed with: Registered Nurse     Rollen Sox   Time Calculation: 24 mins        Regarding student involvement in patient care:  A student participated in this treatment session. Per CMS Medicare statements and APTA guidelines I certify that the following was true:  1. I was present and directly observed the entire session.  2. I made all skilled judgments and clinical decisions regarding care.  3. I am the practitioner responsible for assessment, treatment, and documentation.

## 2015-12-18 NOTE — Progress Notes (Signed)
CM reviewed chart. CM noted pt had right total knee replacement with history of left total knee, arthritis, chronic pain, depression, headache, pulmonary embolism, and right torn rotator cuff. CM met with pt to introduce self and role and offer support. Bedside assessment completed.  ??  ??  CURRENT LIVING SITUATION-  Pt states she lives with her husband in a private residence. Pt was driving prior to this hospital stay and has assistance with transportation as needed. Pt denies use of assistive devices and is independent with ADL's. Pt is currently employed and has a plan for returning to work.  Pt's PCP is Dr Mellody Drown phone # [phone]. Pt last saw her PCP 11/2015. Pt gets her prescriptions filled at [facility]. CM verified with pt her insurance is BLUE CR[employer]. Pt does not have an AMD and declines information.  ??  ??  DISCHARGE PLANNING-  Pt denies need for assistance with medications, transportation, and follow up appointments. Disposition plan is to discharge home in care of family, home health, and self. CM offered choice of [place] provider and pt selected [facility]. Referral sent to [facility]. Sande Rives, RN CRM    Care Management Interventions  PCP Verified by CM: Yes (Dr Mellody Drown phone # [phone])  Palliative Care Consult (Criteria: CHF and RRAT>21): No  Mode of Transport at Discharge: Self  Transition of Care Consult (CM Consult): [place]: No  Reason Outside [place]: Physician referred to specific agency, Patient already serviced by other home care/hospice agency ([facility])  [place]: No  Physical Therapy Consult: Yes  Occupational Therapy Consult: No  Speech Therapy Consult: No  Current Support Network: Lives with Spouse, Own Home  Confirm Follow Up Transport: Family  Plan discussed with Pt/Family/Caregiver: Yes  Freedom of Choice Offered: Yes  Discharge Location  Discharge Placement: Home with home health

## 2015-12-18 NOTE — Progress Notes (Signed)
Problem: Mobility Impaired (Adult and Pediatric)  Goal: *Acute Goals and Plan of Care (Insert Text)  Physical Therapy Goals  Initiated 12/17/2015    1. Patient will move from supine to sit and sit to supine , scoot up and down and roll side to side in bed with modified independence within 4 days.  2. Patient will perform sit to stand with modified independence within 4 days.  3. Patient will ambulate with modified independence for 150 feet with the least restrictive device within 4 days.  4. Patient will ascend/descend 5 stairs with 2 handrail(s) with modified independence within 4 days.  5. Patient will perform THR home exercise program per protocol with modified independence within 4 days.   PHYSICAL THERAPY TREATMENT  Patient: Lindsay [name] ([age] y.o. female)  Date: 12/18/2015  Diagnosis: DJD  Primary osteoarthritis of right knee Primary osteoarthritis of right knee  Procedure(s) (LRB):  RIGHT TOTAL KNEE REPLACEMENT (Right) 1 Day Post-Op  Precautions: Fall, WBAT (low BP at baseline per patient report)      ASSESSMENT:  Patient received supine in bed, c/o increased pain that differs from her previous knee surgery. She also was c/o pain in her calf, and this was her most limiting factor for gait (has known hx of idiopathic DVT in 2014), and this was reported to RN and NP, Sheralyn Boatmanoni. Completed bed exercises as below. Patient with antalgic gait, and minimal knee buckling that required min A to steady during first few steps. Limited gait due to calf pain and had patient return to bed to be examined by RN and NP for this. Anticipate that she will be able to improve function in order to return home with HHPT.   Progression toward goals:  [ ]       Improving appropriately and progressing toward goals  [X]       Improving slowly and progressing toward goals  [ ]       Not making progress toward goals and plan of care will be adjusted       PLAN:  Patient continues to benefit from skilled intervention to address the  above impairments.  Continue treatment per established plan of care.  Discharge Recommendations:  Home Health  Further Equipment Recommendations for Discharge:  Owns all equipment from previous TKR       SUBJECTIVE:   Patient stated ???I just hope I didn't do anything wrong to hurt it.???      OBJECTIVE DATA SUMMARY:   Functional Mobility Training:  Bed Mobility:  Supine to Sit: Supervision  Sit to Supine: Supervision  Transfers:  Sit to Stand: Contact guard assistance  Stand to Sit: Contact guard assistance  Balance:  Sitting: Intact  Standing: Impaired;With support  Standing - Static: Good  Standing - Dynamic : Fair  Ambulation/Gait Training:  Distance (ft): 30 Feet (ft)  Assistive Device: Walker, rolling;Gait belt  Ambulation - Level of Assistance: Contact guard assistance;Minimal assistance  Gait Abnormalities: Antalgic;Decreased step clearance;Step to gait  Right Side Weight Bearing: As tolerated  Base of Support: Narrowed  Stance: Right decreased  Speed/Cadence: Pace decreased (<100 feet/min)  Step Length: Right shortened;Left shortened  Therapeutic Exercises:     EXERCISE   Sets   Reps   Active Active Assist   Passive Self ROM   Comments   Ankle Pumps 1 10 [X]                                         [ ]                                         [ ]                                         [ ]   Quad Sets 1 10                                                                                                                                                                     Heel Slides 1 10                                                                                                                                                                        Pain:  Pain Scale 1: Numeric (0 - 10)  Pain Intensity 1: 3  Pain Location 1: Knee  Pain Orientation 1: Right  Pain Description 1: Aching  Pain Intervention(s) 1: Ice  Activity Tolerance:    BP stable; patient fatigued, note HgB: 6.9  Please refer to the flowsheet for vital signs taken during this treatment.  After treatment:    Patient left in no apparent distress sitting up in chair   Patient left in no apparent distress in bed   Call bell left within reach   Nursing notified   Caregiver present   Bed alarm activated      COMMUNICATION/COLLABORATION:   The patient???s plan of care was discussed with: Registered Nurse and NP     Salvadore Dom, PT, DPT   Time Calculation: 24 mins

## 2015-12-18 NOTE — Progress Notes (Signed)
TOTAL KNEE PROGRESS NOTE      Subjective:     Post-Operative Day: 1 Status Post right Total Knee Arthroplasty  Systemic or Specific Complaints:No Complaints    Objective:     Patient Vitals for the past 24 hrs:   BP Temp Pulse Resp SpO2   12/18/15 0830 104/59 98.4 ??F (36.9 ??C) 73 14 95 %   12/18/15 0320 99/55 97.9 ??F (36.6 ??C) 74 16 93 %   12/17/15 2336 102/58 97.5 ??F (36.4 ??C) 61 16 97 %   12/17/15 1948 114/62 98.4 ??F (36.9 ??C) 80 16 -   12/17/15 1502 109/63 - 82 - -   12/17/15 1501 95/64 97.8 ??F (36.6 ??C) 92 16 95 %   12/17/15 1457 107/68 - 93 - -   12/17/15 1452 (!) 89/60 - 77 - -   12/17/15 1346 100/64 97.7 ??F (36.5 ??C) 82 16 95 %   12/17/15 1257 95/59 97.6 ??F (36.4 ??C) 84 16 96 %   12/17/15 1137 117/76 97.5 ??F (36.4 ??C) 65 15 99 %       General: alert, cooperative, no distress, appears stated age   Wound: Wound clean and dry no evidence of infection., No Erythema, No Edema, No Drainage and Wound Intact   Motion: Extension: Full Extension   DVT Exam: No evidence of DVT seen on physical exam.  Negative Homan's sign.  No cords or calf tenderness.  No significant calf/ankle edema.     Data Review:    Recent Results (from the past 24 hour(s))   METABOLIC PANEL, BASIC    Collection Time: 12/18/15  3:24 AM   Result Value Ref Range    Sodium 136 136 - 145 mmol/L    Potassium 3.5 3.5 - 5.1 mmol/L    Chloride 103 97 - 108 mmol/L    CO2 24 21 - 32 mmol/L    Anion gap 9 5 - 15 mmol/L    Glucose 124 (H) 65 - 100 mg/dL    BUN 9 6 - 20 MG/DL    Creatinine 8.460.59 9.620.55 - 1.02 MG/DL    BUN/Creatinine ratio 15 12 - 20      GFR est AA >60 >60 ml/min/1.4673m2    GFR est non-AA >60 >60 ml/min/1.973m2    Calcium 7.7 (L) 8.5 - 10.1 MG/DL   HEMOGLOBIN    Collection Time: 12/18/15  3:24 AM   Result Value Ref Range    HGB 6.9 (L) 11.5 - 16.0 g/dL         Assessment:     Status Post right Total Knee Arthroplasty. Doing well postoperatively.. Bandage aquacell, clean/dry. Labs stable.  PT progressed with standing.   Pain control, improving.   Main complaints are calf pain, no significant swelling.    Plan:     On Xarelto therapy  Continues current post-op course  Continue PT per protocol  [facility], [facility] tomorrow.

## 2015-12-18 NOTE — Progress Notes (Signed)
Bedside and Verbal shift change report given to Aminata, RN (oncoming nurse) by Nyheem Binette, RN (offgoing nurse). Report included the following information SBAR, Procedure Summary, Intake/Output, MAR, Recent Results and Med Rec Status.

## 2015-12-18 NOTE — Progress Notes (Signed)
Problem: Discharge Planning  Goal: *Discharge to safe environment  Outcome: Progressing Towards Goal  Disposition plan is to discharge home in care of husband, home health, and self

## 2015-12-19 LAB — HEMOGLOBIN: HGB: 6.7 g/dL — ABNORMAL LOW (ref 11.5–16.0)

## 2015-12-19 MED FILL — CELEBREX 200 MG CAPSULE: 200 mg | ORAL | Qty: 1

## 2015-12-19 MED FILL — OXYCODONE 5 MG TAB: 5 mg | ORAL | Qty: 2

## 2015-12-19 MED FILL — DIAZEPAM 5 MG TAB: 5 mg | ORAL | Qty: 1

## 2015-12-19 MED FILL — ROPINIROLE 1 MG TAB: 1 mg | ORAL | Qty: 2

## 2015-12-19 MED FILL — ACETAMINOPHEN 325 MG TABLET: 325 mg | ORAL | Qty: 2

## 2015-12-19 MED FILL — XARELTO 10 MG TABLET: 10 mg | ORAL | Qty: 1

## 2015-12-19 MED FILL — BD POSIFLUSH NORMAL SALINE 0.9 % INJECTION SYRINGE: INTRAMUSCULAR | Qty: 10

## 2015-12-19 MED FILL — SENNA PLUS 8.6 MG-50 MG TABLET: ORAL | Qty: 1

## 2015-12-19 MED FILL — OXYCODONE 5 MG TAB: 5 mg | ORAL | Qty: 1

## 2015-12-19 MED FILL — PANTOPRAZOLE 40 MG TAB, DELAYED RELEASE: 40 mg | ORAL | Qty: 1

## 2015-12-19 MED FILL — ESCITALOPRAM 10 MG TAB: 10 mg | ORAL | Qty: 2

## 2015-12-19 MED FILL — POLYETHYLENE GLYCOL 3350 17 GRAM (100 %) ORAL POWDER PACKET: 17 gram | ORAL | Qty: 1

## 2015-12-19 NOTE — Consults (Signed)
Consults by Juliene Pina, MD at 12/19/15 1356                Author: Juliene Pina, MD  Service: Hematology and Oncology  Author Type: Physician       Filed: 12/19/15 1707  Date of Service: 12/19/15 1356  Status: Addendum          Editor: Juliene Pina, MD (Physician)          Related Notes: Original Note by Jearld Pies, NP (Nurse Practitioner) filed at 12/19/15  1633            Consult Orders        1. IP CONSULT TO HEMATOLOGY [[id number]] ordered by Vianne Bulls, MD at 12/19/15 1056                                      Hematology/Oncology Consult      REASON FOR CONSULT: DVT   REQUESTED BY: Dr. Cassell Smiles      HISTORY OF PRESENT ILLNESS: Lindsay [name]  is a 60 y.o. female who we are  asked to see in consultation for DVT after right knee replacement. She was admitted on 12/17/15 for scheduled knee replacement. She has been recovering as anticipated since that time. States that she recently had her left knee replaced and did not experience  any pain afterwards. She states that from the time she woke up on Tuesday, she has been having pain in her right calf. This was initially attributed to muscle pain as she felt it improved when working with therapy. Dopplers obtained today consistent with  acute thrombosis involving the popliteal and posterior tibial veins of the right leg. She had been on daily Xarelto 39m which was initiated on 12/17/15 after surgery.       History significant for acute pulmonary embolus 12/2012 for which she saw our partner, Dr. RAlphonzo Grieve This was a provoked thrombus. Risk factors at that time were recent fractured wrist with immobilization and two 10+ hour long car rides. She completed three  months of Xarelto at that time. She denies any other clots. Hypercoaguable work up completed at that time negative.      She has had CTA's completed in 12/2013 and 02/2014 with complaints of chest pain. Both were negative.      Today, she is resting in bed. Still having pain in her right leg.  Denies headaches, dizziness. Denies shortness of breath at rest. Denies nausea, vomiting, diarrhea. She denies any bleeding to include nose bleeds, hematochezia, hemoptysis, hematuria,  and melena. No family present during my visit.        Past Medical History:        Diagnosis  Date         ?  Abnormal brain scan       ?  Arthritis       ?  Chronic pain       ?  Depression       ?  Falls frequently       ?  Fracture of left ankle       ?  Headache  2011     ?  Ill-defined condition  09/15/2015          FX R HAND         ?  Other and unspecified hyperlipidemia  12/27/2012     ?  PE (pulmonary  embolism)  12/2012     ?  Torn rotator cuff            right             Past Surgical History:         Procedure  Laterality  Date          ?  BREAST SURGERY PROCEDURE UNLISTED    2002          AUGMENTATION          ?  HX APPENDECTOMY         ?  HX CESAREAN SECTION              x4          ?  HX GI              COLONOSCOPY          ?  HX KNEE REPLACEMENT  Left  10/29/2015     ?  HX ORTHOPAEDIC    08/2004          rotater cuff r. shoulder          ?  HX ORTHOPAEDIC              left ankle fracture treated with air cast          ?  HX ORTHOPAEDIC    6/13          left shoulder fxt and dislocation          ?  HX OTHER SURGICAL    2010          left foot torn tendon          ?  HX TUBAL LIGATION               No Known Allergies        Current Facility-Administered Medications             Medication  Dose  Route  Frequency  Provider  Last Rate  Last Dose              ?  ondansetron (ZOFRAN) injection 4 mg   4 mg  IntraVENous  Q4H PRN  Vianne Bulls, MD     4 mg at 12/18/15 0224     ?  sodium chloride 0.9 % bolus infusion 500 mL   500 mL  IntraVENous  ONCE PRN  Juliette Alcide, PA-C           ?  sodium chloride (NS) flush 5-10 mL   5-10 mL  IntraVENous  Q8H  Juliette Alcide, PA-C     10 mL at 12/19/15 0547     ?  sodium chloride (NS) flush 5-10 mL   5-10 mL  IntraVENous  PRN  Juliette Alcide, PA-C           ?   acetaminophen (TYLENOL) tablet 650 mg   650 mg  Oral  Q6H  Juliette Alcide, PA-C     650 mg at 12/19/15 5462     ?  oxyCODONE IR (ROXICODONE) tablet 5 mg   5 mg  Oral  Q3H PRN  Juliette Alcide, PA-C     5 mg at 12/19/15 1338     ?  oxyCODONE IR (ROXICODONE) tablet 10 mg   10 mg  Oral  Q3H PRN  Juliette Alcide, PA-C     10 mg at 12/19/15 0220     ?  celecoxib (CELEBREX) capsule 200 mg   200 mg  Oral  BID  Juliette Alcide, PA-C     200 mg at 12/19/15 1003     ?  naloxone [facility]) injection 0.4 mg   0.4 mg  IntraVENous  PRN  Juliette Alcide, PA-C           ?  hydrOXYzine HCl (ATARAX) tablet 10 mg   10 mg  Oral  Q8H PRN  Juliette Alcide, PA-C           ?  famotidine (PEPCID) tablet 20 mg   20 mg  Oral  BID PRN  Juliette Alcide, PA-C     20 mg at 12/18/15 0749              ?  bisacodyl (DULCOLAX) suppository 10 mg   10 mg  Rectal  DAILY PRN  Juliette Alcide, PA-C                    ?  rivaroxaban Alveda Reasons) tablet 10 mg   10 mg  Oral  DAILY WITH DINNER  Juliette Alcide, PA-C     10 mg at 12/18/15 1830     ?  diazePAM (VALIUM) tablet 5 mg   5 mg  Oral  Q8H PRN  Juliette Alcide, PA-C     5 mg at 12/17/15 2216     ?  escitalopram oxalate (LEXAPRO) tablet 20 mg   20 mg  Oral  QHS  Juliette Alcide, PA-C     20 mg at 12/18/15 2138     ?  pantoprazole (PROTONIX) tablet 40 mg   40 mg  Oral  QHS  Juliette Alcide, PA-C     40 mg at 12/18/15 2138     ?  senna-docusate (PERICOLACE) 8.6-50 mg per tablet 1 Tab   1 Tab  Oral  BID  Erma Heritage, NP     1 Tab at 12/19/15 1002     ?  polyethylene glycol (MIRALAX) packet 17 g   17 g  Oral  DAILY  Erma Heritage, NP     17 g at 12/19/15 1003              ?  rOPINIRole (REQUIP) tablet 1.5 mg   1.5 mg  Oral  QPM  Dorann Ou, MD     1.5 mg at 12/18/15 2137             Social History          Social History         ?  Marital status:  MARRIED              Spouse name:  N/A         ?  Number of children:  N/A         ?  Years of education:  N/A          Social  History Main Topics         ?  Smoking status:  Never Smoker     ?  Smokeless tobacco:  Never Used         ?  Alcohol use  8.4 oz/week             14 Cans of beer per week                Comment: 2 MICHELOB ULTRA PER DAY         ?  Drug use:  No     ?  Sexual activity:  Yes              Partners:  Male           Other Topics  Concern        ?  None          Social History Narrative             Family History         Problem  Relation  Age of Onset          ?  Heart Disease  Mother       ?  Heart Disease  Father       ?  Cancer  Father               thyroid cancer          ?  Arthritis-rheumatoid  Sister       ?  No Known Problems  Son       ?  No Known Problems  Daughter       ?  No Known Problems  Daughter       ?  No Known Problems  Daughter            ?  Anesth Problems  Neg Hx             ROS   As per the HPI, otherwise a comprehensive ROS is negative.      Physical Examination:      Visit Vitals         ?  BP  92/51 (BP 1 Location: Left arm, BP Patient Position: At rest)     ?  Pulse  75     ?  Temp  99 ??F (37.2 ??C)     ?  Resp  16     ?  Ht  '5\' 3"'  (1.6 m)     ?  Wt  159 lb 13.3 oz (72.5 kg)     ?  SpO2  94%         ?  BMI  28.31 kg/m2        General appearance - alert, well appearing, and in no distress   Mental status - oriented to person, place, and time   Mouth - mucous membranes moist, pharynx normal without lesions   Neck - supple, no significant adenopathy   Lymphatics - no palpable lymphadenopathy, no hepatosplenomegaly   Chest - clear to auscultation, no wheezes, rales or rhonchi, symmetric air entry   Heart - normal rate, regular rhythm, normal S1, S2, no murmurs, rubs, clicks or gallops   Abdomen - soft, nontender, nondistended, no masses or organomegaly, bowel sounds present   Neurological - normal speech, no focal findings or movement disorder noted   Musculoskeletal - recent right total knee, dressing CDI   Extremities - peripheral pulses normal, right LE edema 2+   Skin - warm, dry, right knee as  above      LABS     Lab Results         Component  Value  Date/Time            WBC  4.9  12/10/2015 10:43 AM       HGB  6.7  12/19/2015 02:21 AM       HCT  29.8  12/10/2015 10:43 AM       PLATELET  260  12/10/2015 10:43 AM       MCV  80.1  12/10/2015 10:43 AM            ABS. NEUTROPHILS  3.8  11/06/2014 03:35 PM          Lab Results         Component  Value  Date/Time            Sodium  136  12/18/2015 03:24 AM       Potassium  3.5  12/18/2015 03:24 AM       Chloride  103  12/18/2015 03:24 AM       CO2  24  12/18/2015 03:24 AM       Glucose  124  12/18/2015 03:24 AM       BUN  9  12/18/2015 03:24 AM       Creatinine  0.59  12/18/2015 03:24 AM       GFR est AA  >60  12/18/2015 03:24 AM       GFR est non-AA  >60  12/18/2015 03:24 AM            Calcium  7.7  12/18/2015 03:24 AM          Lab Results         Component  Value  Date/Time            AST (SGOT)  17  11/06/2014 03:35 PM       Alk. phosphatase  109  11/06/2014 03:35 PM       Protein, total  7.0  11/06/2014 03:35 PM       Albumin  4.4  11/06/2014 03:35 PM       Globulin  3.2  02/05/2014 02:58 AM            A-G Ratio  1.7  11/06/2014 03:35 PM        Venous Duplex 12/19/15   Right: Consistent with thrombosis involving the popliteal and   posterior tibial (one of two) as demonstrated by vein   non-compressibility, and by a narrowing or occlusion of the flow   channel on color Doppler imaging. The remaining segments; common   femoral, deep femoral, femoral, peroneal, and great saphenous are   patent and without evidence of thrombus; each is is fully compressible   and there is no narrowing of the flow channel on color Doppler   imaging. Phasic flow is observed in the common femoral vein.   Left: The common femoral vein is patent and without evidence of   thrombus. Phasic flow is observed. This extremity was not otherwise   evaluated.      ASSESSMENT   Lindsay [name] is a  60 y.o. female with a history of provoked PE admitted for right total knee replacement,  s/p  surgery with thrombosis involving the popliteal and posterior tibial veins for which we are consulted.      DISCUSSION/PLAN   1. DVT, provoked by surgery. Per patient report, she woke up with pain in her right calf directly after surgery. Xarelto was initiated later that  evening at post-operative DVT prophylaxis, 67m PO daily dosing. She continued with leg pain/swelling after surgery which was attributed to surgery itself as well as muscle pain in her calf from possible positioning. Doppler revealed thrombus.      Recommend initiating treatment dose Xarelto which would be 14mtwice daily for 21 days followed by 2048mnce daily there after for a total of three months. There  is no reversal agent for Xarelto and with HgB at 6.7, concern for increased risk of bleeding.  Discussed risk/benefit of anticoagulation. Discussed recommendation for transfusion. She is not overly excited with this idea, would like to recheck CBC overnight. Okay with this. Transfuse if HgB is less than 7.0.      She can follow up with our partner Dr. Alphonzo Grieve at [facility] after discharge to discuss duration of anticoagulation with two recent blood clots, even though provoked.      2. History of PE, provoked. Diagnosed in 2014. Seen by our partner, Dr. Alphonzo Grieve, at [place]. Hypercoaguable work up was negative. She completed 3 months of anticoagulation with Xarelto without trouble.      3. Anemia. Likely related to surgery. HgB 9.1 last week prior to surgery. Will recheck CBC overnight. Recommend transfuse to maintain HgB > 7.0. Discussed with Dr. Cassell Smiles. If anemia persists after hospitalization, recommend anemia work up at follow up  appointment.      Appreciate consultation and care of Lindsay [name]. Please call with any questions.          Seen in conjunction with Runell Gess NP.      Signed by: Juliene Pina, MD                      December 19, 2015

## 2015-12-19 NOTE — Procedures (Signed)
[name]  *** FINAL REPORT ***    Name: Lindsay [name], Lindsay [name]  MRN: [id number]    Inpatient  DOB: 03 Oct 1955  HIS Order #: [id number]  TRAKnet Visit #: [id number]  Date: 19 Dec 2015    TYPE OF TEST: Peripheral Venous Testing    REASON FOR TEST  Pain in limb, Limb swelling    Right Leg:-  Deep venous thrombosis:           Yes  Proximal extent of thrombus:      Popliteal At The Knee  Superficial venous thrombosis:    No  Deep venous insufficiency:        Not examined  Superficial venous insufficiency: Not examined      INTERPRETATION/FINDINGS  PROCEDURE:  Color duplex ultrasound imaging of lower extremity veins.    FINDINGS:       Right: Consistent with thrombosis involving the popliteal and  posterior tibial (one of two) as demonstrated by vein  non-compressibility, and by a narrowing or occlusion of the flow  channel on color Doppler imaging.  The remaining segments; common  femoral, deep femoral, femoral, peroneal, and great saphenous are  patent and without evidence of thrombus; each is is fully compressible   and there is no narrowing of the flow channel on color Doppler  imaging.  Phasic flow is observed in the common femoral vein.       Left:   The common femoral vein is patent and without evidence of   thrombus.  Phasic flow is observed.  This extremity was not otherwise   evaluated.    IMPRESSION:  There is evidence of vein thrombosis, as described above.    The ultrasound appearance is more consistent with an acute than a  chronic process.    ADDITIONAL COMMENTS    I have personally reviewed the data relevant to the interpretation of  this  study.    TECHNOLOGIST: Bonnye FavaJames Kaczor, RVT  Signed: 12/19/2015 08:39 AM    PHYSICIAN: Lenn SinkAvik Dabney Schanz, MD  Signed: 12/19/2015 02:47 PM

## 2015-12-19 NOTE — Progress Notes (Signed)
Spiritual Care Partner Volunteer visited patient in [facility] on 12/19/15.  Documented by:  Alyus Mofield, M.Div  Chaplain

## 2015-12-19 NOTE — Progress Notes (Signed)
Bedside and Verbal shift change report given to Narges, RN (oncoming nurse) by Ashaz Robling, RN (offgoing nurse). Report included the following information SBAR, Procedure Summary, Intake/Output, MAR, Recent Results and Med Rec Status.

## 2015-12-19 NOTE — Progress Notes (Signed)
Bedside and Verbal shift change report given to Joel (oncoming nurse) by Aminata Fornah, RN (offgoing nurse).  Report given with SBAR, Kardex and MAR.

## 2015-12-19 NOTE — Consults (Addendum)
Hematology/Oncology Consult    REASON FOR CONSULT: DVT  REQUESTED BY: Dr. Cassell Smiles    HISTORY OF PRESENT ILLNESS: Ms. Scarpati is a 60 y.o. female who we are asked to see in consultation for DVT after right knee replacement. She was admitted on 12/17/15 for scheduled knee replacement. She has been recovering as anticipated since that time. States that she recently had her left knee replaced and did not experience any pain afterwards. She states that from the time she woke up on Tuesday, she has been having pain in her right calf. This was initially attributed to muscle pain as she felt it improved when working with therapy. Dopplers obtained today consistent with acute thrombosis involving the popliteal and posterior tibial veins of the right leg. She had been on daily Xarelto 55m which was initiated on 12/17/15 after surgery.     History significant for acute pulmonary embolus 12/2012 for which she saw our partner, Dr. RAlphonzo Grieve This was a provoked thrombus. Risk factors at that time were recent fractured wrist with immobilization and two 10+ hour long car rides. She completed three months of Xarelto at that time. She denies any other clots. Hypercoaguable work up completed at that time negative.    She has had CTA's completed in 12/2013 and 02/2014 with complaints of chest pain. Both were negative.    Today, she is resting in bed. Still having pain in her right leg. Denies headaches, dizziness. Denies shortness of breath at rest. Denies nausea, vomiting, diarrhea. She denies any bleeding to include nose bleeds, hematochezia, hemoptysis, hematuria, and melena. No family present during my visit.    Past Medical History:   Diagnosis Date   ??? Abnormal brain scan    ??? Arthritis    ??? Chronic pain    ??? Depression    ??? Falls frequently    ??? Fracture of left ankle    ??? Headache 2011   ??? Ill-defined condition 09/15/2015    FX R HAND   ??? Other and unspecified hyperlipidemia 12/27/2012   ??? PE (pulmonary embolism) 12/2012    ??? Torn rotator cuff     right       Past Surgical History:   Procedure Laterality Date   ??? BREAST SURGERY PROCEDURE UNLISTED  2002    AUGMENTATION   ??? HX APPENDECTOMY     ??? HX CESAREAN SECTION      x4   ??? HX GI      COLONOSCOPY   ??? 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 HX ORTHOPAEDIC  08/2004    rotater cuff r. shoulder   ??? HX ORTHOPAEDIC      left ankle fracture treated with air cast   ??? HX ORTHOPAEDIC  6/13    left shoulder fxt and dislocation   ??? HX OTHER SURGICAL  2010    left foot torn tendon   ??? HX TUBAL LIGATION         No Known Allergies    Current Facility-Administered Medications   Medication Dose Route Frequency Provider Last Rate Last Dose   ??? ondansetron (ZOFRAN) injection 4 mg  4 mg IntraVENous Q4H PRN LVianne Bulls MD   4 mg at 12/18/15 0224   ??? sodium chloride 0.9 % bolus infusion 500 mL  500 mL IntraVENous ONCE PRN WJuliette Alcide PA-C       ??? sodium chloride (NS) flush 5-10 mL  5-10 mL IntraVENous Q8H WJuliette Alcide PA-C   10 mL at  12/19/15 0547   ??? sodium chloride (NS) flush 5-10 mL  5-10 mL IntraVENous PRN Juliette Alcide, PA-C       ??? acetaminophen (TYLENOL) tablet 650 mg  650 mg Oral Q6H Juliette Alcide, PA-C   650 mg at 12/19/15 8315   ??? oxyCODONE IR (ROXICODONE) tablet 5 mg  5 mg Oral Q3H PRN Juliette Alcide, PA-C   5 mg at 12/19/15 1338   ??? oxyCODONE IR (ROXICODONE) tablet 10 mg  10 mg Oral Q3H PRN Juliette Alcide, PA-C   10 mg at 12/19/15 0220   ??? celecoxib (CELEBREX) capsule 200 mg  200 mg Oral BID Juliette Alcide, PA-C   200 mg at 12/19/15 1003   ??? naloxone (NARCAN) injection 0.4 mg  0.4 mg IntraVENous PRN Juliette Alcide, PA-C       ??? hydrOXYzine HCl (ATARAX) tablet 10 mg  10 mg Oral Q8H PRN Juliette Alcide, PA-C       ??? famotidine (PEPCID) tablet 20 mg  20 mg Oral BID PRN Juliette Alcide, PA-C   20 mg at 12/18/15 0749   ??? bisacodyl (DULCOLAX) suppository 10 mg  10 mg Rectal DAILY PRN Juliette Alcide, PA-C        ??? rivaroxaban Alveda Reasons) tablet 10 mg  10 mg Oral DAILY WITH DINNER Juliette Alcide, PA-C   10 mg at 12/18/15 1830   ??? diazePAM (VALIUM) tablet 5 mg  5 mg Oral Q8H PRN Juliette Alcide, PA-C   5 mg at 12/17/15 2216   ??? escitalopram oxalate (LEXAPRO) tablet 20 mg  20 mg Oral QHS Juliette Alcide, PA-C   20 mg at 12/18/15 2138   ??? pantoprazole (PROTONIX) tablet 40 mg  40 mg Oral QHS Juliette Alcide, PA-C   40 mg at 12/18/15 2138   ??? senna-docusate (PERICOLACE) 8.6-50 mg per tablet 1 Tab  1 Tab Oral BID Erma Heritage, NP   1 Tab at 12/19/15 1002   ??? polyethylene glycol (MIRALAX) packet 17 g  17 g Oral DAILY Erma Heritage, NP   17 g at 12/19/15 1003   ??? rOPINIRole (REQUIP) tablet 1.5 mg  1.5 mg Oral QPM Dorann Ou, MD   1.5 mg at 12/18/15 2137       Social History     Social History   ??? Marital status: MARRIED     Spouse name: N/A   ??? Number of children: N/A   ??? Years of education: N/A     Social History Main Topics   ??? Smoking status: Never Smoker   ??? Smokeless tobacco: Never Used   ??? Alcohol use 8.4 oz/week     14 Cans of beer per week      Comment: 2 MICHELOB ULTRA PER DAY   ??? Drug use: No   ??? Sexual activity: Yes     Partners: Male     Other Topics Concern   ??? None     Social History Narrative       Family History   Problem Relation Age of Onset   ??? Heart Disease Mother    ??? Heart Disease Father    ??? Cancer Father      thyroid cancer   ??? Arthritis-rheumatoid Sister    ??? No Known Problems Son    ??? No Known Problems Daughter    ??? No Known Problems Daughter    ??? No Known Problems Daughter    ???  Anesth Problems Neg Hx        ROS  As per the HPI, otherwise a comprehensive ROS is negative.    Physical Examination:   Visit Vitals   ??? BP 92/51 (BP 1 Location: Left arm, BP Patient Position: At rest)   ??? Pulse 75   ??? Temp 99 ??F (37.2 ??C)   ??? Resp 16   ??? Ht _0  (1.6 m)   ??? Wt 159 lb 13.3 oz (72.5 kg)   ??? SpO2 94%   ??? BMI 28.31 kg/m2     General appearance - alert, well appearing, and in no distress   Mental status - oriented to person, place, and time  Mouth - mucous membranes moist, pharynx normal without lesions  Neck - supple, no significant adenopathy  Lymphatics - no palpable lymphadenopathy, no hepatosplenomegaly  Chest - clear to auscultation, no wheezes, rales or rhonchi, symmetric air entry  Heart - normal rate, regular rhythm, normal S1, S2, no murmurs, rubs, clicks or gallops  Abdomen - soft, nontender, nondistended, no masses or organomegaly, bowel sounds present  Neurological - normal speech, no focal findings or movement disorder noted  Musculoskeletal - recent right total knee, dressing CDI  Extremities - peripheral pulses normal, right LE edema 2+  Skin - warm, dry, right knee as above    LABS  Lab Results   Component Value Date/Time    WBC 4.9 12/10/2015 10:43 AM    HGB 6.7 12/19/2015 02:21 AM    HCT 29.8 12/10/2015 10:43 AM    PLATELET 260 12/10/2015 10:43 AM    MCV 80.1 12/10/2015 10:43 AM    ABS. NEUTROPHILS 3.8 11/06/2014 03:35 PM     Lab Results   Component Value Date/Time    Sodium 136 12/18/2015 03:24 AM    Potassium 3.5 12/18/2015 03:24 AM    Chloride 103 12/18/2015 03:24 AM    CO2 24 12/18/2015 03:24 AM    Glucose 124 12/18/2015 03:24 AM    BUN 9 12/18/2015 03:24 AM    Creatinine 0.59 12/18/2015 03:24 AM    GFR est AA >60 12/18/2015 03:24 AM    GFR est non-AA >60 12/18/2015 03:24 AM    Calcium 7.7 12/18/2015 03:24 AM     Lab Results   Component Value Date/Time    AST (SGOT) 17 11/06/2014 03:35 PM    Alk. phosphatase 109 11/06/2014 03:35 PM    Protein, total 7.0 11/06/2014 03:35 PM    Albumin 4.4 11/06/2014 03:35 PM    Globulin 3.2 02/05/2014 02:58 AM    A-G Ratio 1.7 11/06/2014 03:35 PM     Venous Duplex 12/19/15  Right: Consistent with thrombosis involving the popliteal and  posterior tibial (one of two) as demonstrated by vein  non-compressibility, and by a narrowing or occlusion of the flow  channel on color Doppler imaging. The remaining segments; common   femoral, deep femoral, femoral, peroneal, and great saphenous are  patent and without evidence of thrombus; each is is fully compressible  and there is no narrowing of the flow channel on color Doppler  imaging. Phasic flow is observed in the common femoral vein.  Left: The common femoral vein is patent and without evidence of  thrombus. Phasic flow is observed. This extremity was not otherwise  evaluated.    ASSESSMENT  Ms. Ellwanger is a 60 y.o. female with a history of provoked PE admitted for right total knee replacement, s/p surgery with thrombosis involving the popliteal and posterior tibial veins  for which we are consulted.    DISCUSSION/PLAN  1. DVT, provoked by surgery. Per patient report, she woke up with pain in her right calf directly after surgery. Xarelto was initiated later that evening at post-operative DVT prophylaxis, 19m PO daily dosing. She continued with leg pain/swelling after surgery which was attributed to surgery itself as well as muscle pain in her calf from possible positioning. Doppler revealed thrombus.    Recommend initiating treatment dose Xarelto which would be 110mtwice daily for 21 days followed by 2036mnce daily there after for a total of three months. There is no reversal agent for Xarelto and with HgB at 6.7, concern for increased risk of bleeding. Discussed risk/benefit of anticoagulation. Discussed recommendation for transfusion. She is not overly excited with this idea, would like to recheck CBC overnight. Okay with this. Transfuse if HgB is less than 7.0.    She can [facility] up [facility] our [facility] duration of anticoagulation with two recent blood clots, even though provoked.    2. History of PE, provoked. Diagnosed in 2014. Seen by our partner, [facility] She completed 3 months of anticoagulation with Xarelto without trouble.     3. Anemia. Likely related to surgery. HgB 9.1 last week prior to surgery. Will recheck CBC overnight. Recommend transfuse to maintain HgB > 7.0. Discussed with Dr. GarCassell Smilesf anemia persists after hospitalization, recommend anemia work up at follow up appointment.    Appreciate consultation and care of Ms. ParSchlickerlease call with any questions.       Seen in conjunction with AshRunell Gess.    Signed by: RacJuliene PinaD                     December 19, 2015

## 2015-12-19 NOTE — Progress Notes (Signed)
TOTAL KNEE PROGRESS NOTE      Subjective:     Post-Operative Day: 2 Status Post right Total Knee Arthroplasty  Systemic or Specific Complaints:No Complaints    Objective:     Patient Vitals for the past 24 hrs:   BP Temp Pulse Resp SpO2 Weight   12/19/15 0219 118/75 98.7 ??F (37.1 ??C) - 18 94 % -   12/18/15 2114 - - - - - 72.5 kg (159 lb 13.3 oz)   12/18/15 2026 111/57 98.1 ??F (36.7 ??C) 81 16 96 % -   12/18/15 1556 107/64 - - - - -   12/18/15 1518 132/64 98.1 ??F (36.7 ??C) 76 16 100 % -   12/18/15 0915 108/65 - 72 - - -   12/18/15 0830 104/59 98.4 ??F (36.9 ??C) 73 14 95 % -       General: alert, cooperative, no distress, appears stated age   Wound: Wound clean and dry no evidence of infection., No Erythema, No Edema, No Drainage and Wound Intact   Motion: Extension: Full Extension   DVT Exam: No evidence of DVT seen on physical exam.  Negative Homan's sign.  Positive calf tenderness.  No significant calf/ankle edema.     Data Review:    Recent Results (from the past 24 hour(s))   HEMOGLOBIN    Collection Time: 12/19/15  2:21 AM   Result Value Ref Range    HGB 6.7 (L) 11.5 - 16.0 g/dL         Assessment:     Status Post right Total Knee Arthroplasty. Doing well postoperatively.. Bandage aquacell, clean/dry. Labs stable, hgb 6.7 but minimally symptomatic.  PT making progress.  Pain control WNL.     Plan:     Monitor Hgb today, consider 1 PRBCs if symptomatic.  Will order right lower extremity doppler  Continues current post-op course  Continue PT per protocol  [facility] with PT

## 2015-12-19 NOTE — Progress Notes (Addendum)
Physical Therapy    Chart reviewed and spoke w/ RN. Pt Doppler positive for Right LE DVT. Will hold therapy for today and will follow up over weekend as able and appropriate.    Miranda Means,PTA

## 2015-12-19 NOTE — Telephone Encounter (Signed)
Lindsay BillingsKathy A [name]   02/17/1956    Developed DVT in right lower extremity  [name]  Rm [phone]  [phone]

## 2015-12-19 NOTE — Procedures (Signed)
[name]  *** FINAL REPORT ***    Name: [name], Lindsay  MRN: [id number]    Inpatient  DOB: 03 Oct 1955  HIS Order #: [id number]  TRAKnet Visit #: [id number]  Date: 19 Dec 2015    TYPE OF TEST: Peripheral Venous Testing    REASON FOR TEST  Pain in limb, Limb swelling    Right Leg:-  Deep venous thrombosis:           Yes  Proximal extent of thrombus:      Popliteal At The Knee  Superficial venous thrombosis:    No  Deep venous insufficiency:        Not examined  Superficial venous insufficiency: Not examined      INTERPRETATION/FINDINGS  PROCEDURE:  Color duplex ultrasound imaging of lower extremity veins.    FINDINGS:       Right: Consistent with thrombosis involving the popliteal and  posterior tibial (one of two) as demonstrated by vein  non-compressibility, and by a narrowing or occlusion of the flow  channel on color Doppler imaging.  The remaining segments; common  femoral, deep femoral, femoral, peroneal, and great saphenous are  patent and without evidence of thrombus; each is is fully compressible   and there is no narrowing of the flow channel on color Doppler  imaging.  Phasic flow is observed in the common femoral vein.       Left:   The common femoral vein is patent and without evidence of   thrombus.  Phasic flow is observed.  This extremity was not otherwise   evaluated.    IMPRESSION:  There is evidence of vein thrombosis, as described above.    The ultrasound appearance is more consistent with an acute than a  chronic process.    ADDITIONAL COMMENTS    I have personally reviewed the data relevant to the interpretation of  this  study.    TECHNOLOGIST: James Kaczor, RVT  Signed: 12/19/2015 08:39 AM    PHYSICIAN: Zane Samson, MD  Signed: 12/19/2015 02:47 PM

## 2015-12-20 LAB — HEMOGLOBIN: HGB: 7.6 g/dL — ABNORMAL LOW (ref 11.5–16.0)

## 2015-12-20 MED ORDER — RIVAROXABAN 10 MG TAB
10 mg | Freq: Two times a day (BID) | ORAL | Status: DC
Start: 2015-12-20 — End: 2015-12-22
  Administered 2015-12-20 – 2015-12-22 (×5): via ORAL

## 2015-12-20 MED ORDER — RIVAROXABAN 15 MG TAB
15 mg | ORAL_TABLET | Freq: Two times a day (BID) | ORAL | 0 refills | Status: AC
Start: 2015-12-20 — End: 2016-01-10

## 2015-12-20 MED FILL — OXYCODONE 5 MG TAB: 5 mg | ORAL | Qty: 2

## 2015-12-20 MED FILL — ROPINIROLE 1 MG TAB: 1 mg | ORAL | Qty: 2

## 2015-12-20 MED FILL — OXYCODONE 5 MG TAB: 5 mg | ORAL | Qty: 1

## 2015-12-20 MED FILL — ESCITALOPRAM 10 MG TAB: 10 mg | ORAL | Qty: 2

## 2015-12-20 MED FILL — ACETAMINOPHEN 325 MG TABLET: 325 mg | ORAL | Qty: 2

## 2015-12-20 MED FILL — BD POSIFLUSH NORMAL SALINE 0.9 % INJECTION SYRINGE: INTRAMUSCULAR | Qty: 10

## 2015-12-20 MED FILL — CELEBREX 200 MG CAPSULE: 200 mg | ORAL | Qty: 1

## 2015-12-20 MED FILL — SENNA PLUS 8.6 MG-50 MG TABLET: ORAL | Qty: 1

## 2015-12-20 MED FILL — DIAZEPAM 5 MG TAB: 5 mg | ORAL | Qty: 1

## 2015-12-20 MED FILL — XARELTO 10 MG TABLET: 10 mg | ORAL | Qty: 2

## 2015-12-20 MED FILL — PANTOPRAZOLE 40 MG TAB, DELAYED RELEASE: 40 mg | ORAL | Qty: 1

## 2015-12-20 MED FILL — POLYETHYLENE GLYCOL 3350 17 GRAM (100 %) ORAL POWDER PACKET: 17 gram | ORAL | Qty: 1

## 2015-12-20 NOTE — Progress Notes (Signed)
Problem: Mobility Impaired (Adult and Pediatric)  Goal: *Acute Goals and Plan of Care (Insert Text)  Physical Therapy Goals  Initiated 12/17/2015    1. Patient will move from supine to sit and sit to supine , scoot up and down and roll side to side in bed with modified independence within 4 days.  2. Patient will perform sit to stand with modified independence within 4 days.  3. Patient will ambulate with modified independence for 150 feet with the least restrictive device within 4 days.  4. Patient will ascend/descend 5 stairs with 2 handrail(s) with modified independence within 4 days.  5. Patient will perform THR home exercise program per protocol with modified independence within 4 days.   PHYSICAL THERAPY TREATMENT  Patient: Lindsay [name] (60 y.o. female)  Date: 12/20/2015  Diagnosis: DJD  Primary osteoarthritis of right knee Primary osteoarthritis of right knee  Procedure(s) (LRB):  RIGHT TOTAL KNEE REPLACEMENT (Right) 3 Days Post-Op  Precautions: Fall, WBAT (low BP at baseline per patient report)      ASSESSMENT:  I spoke with nurse and PA-C extensively prior to session, her hemoglobin seems to be rising > 7.0, BP is still low but steady, OK to gait but no stairs and back to bed if any symptoms.  She is able to exercise in supine without incident.  Patient is received in standing after finishing up the restroom.  She can ambulate further but reports feeling some lightheadedness towards the end so we go back to bed.  She is given her exercises and tol those well as well. Anticipate if she is medically cleared and can do stairs she can go home.  Progression toward goals:  [ ]       Improving appropriately and progressing toward goals  [X]       Improving slowly and progressing toward goals  [ ]       Not making progress toward goals and plan of care will be adjusted       PLAN:  Patient continues to benefit from skilled intervention to address the  above impairments.  Continue treatment per established plan of care.  Discharge Recommendations:  Home Health and Outpatient  Further Equipment Recommendations for Discharge:  TBD       SUBJECTIVE:   Patient stated ???I'm so glad I could go walking today.???      OBJECTIVE DATA SUMMARY:   Critical Behavior:  Neurologic State: Alert  Orientation Level: Oriented X4        Range of Motion:                          Functional Mobility Training:  Bed Mobility:     Supine to Sit: Supervision  Sit to Supine: Supervision           Transfers:  Sit to Stand: Contact guard assistance  Stand to Sit: Contact guard assistance                             Balance:     Ambulation/Gait Training:  Distance (ft): 120 Feet (ft)  Assistive Device: Gait belt;Walker, rolling  Ambulation - Level of Assistance: Contact guard assistance        Gait Abnormalities: Antalgic  Right Side Weight Bearing: As tolerated     Base of Support: Narrowed  Stance: Left decreased;Right decreased  Speed/Cadence: Slow  Step Length: Left shortened;Right shortened  She can ambulate down to mid-point of nurses station then back to bed.  Stairs:           Therapeutic Exercises:     EXERCISE   Sets   Reps   Active Active Assist   Passive Self ROM   Comments   Ankle Pumps 1 10                                                                                                                                                                     Quad Sets 1 10                                                                                                                                                                     Hamstring Sets                                                                                                                                                                         Short Arc Quads 1 10                                                                                                                            Knee Extension Stretch       [ ]$                                           [ ]$                                           [ ]$                                           [ ]$                                             Heel Slides 1 10 [X]$                                         [ ]$                                         [ ]$                                         [ ]$                                             Long Arc Quads     [ ]$                                         [ ]$                                         [ ]$                                         [ ]$                                             Knee Flexion Stretch     [ ]$                                         [ ]$                                         [ ]$                                         [ ]$   Straight Leg Raises     [ ]                                         [ ]                                         [ ]                                         [ ]                                                Pain:  Pain Scale 1: Numeric (0 - 10)  Pain Intensity 1: 3  Pain Location 1: Knee  Pain Orientation 1: Right  Pain Description 1: Aching  Pain Intervention(s) 1: Declines  Activity Tolerance:   Tol to gait is mildly limited but she still completes her gait.  Please refer to the flowsheet for vital signs taken during this treatment.  After treatment:   [ ]  Patient left in no apparent distress sitting up in chair  [X]  Patient left in no apparent distress in bed  [X]  Call bell left within reach  [X]  Nursing notified  [ ]  Caregiver present  [ ]  Bed alarm activated      COMMUNICATION/COLLABORATION:   The patient???s plan of care was discussed with: Registered Nurse     Fuller CanadaBryan C Gilreath, PT   Time Calculation: 16 mins

## 2015-12-20 NOTE — Progress Notes (Signed)
Bedside shift change report given to Stephanie , RN (oncoming nurse) by Narges, RN (offgoing nurse).  Report given with SBAR, Kardex, Intake/Output and MAR.

## 2015-12-20 NOTE — Progress Notes (Signed)
Bedside shift change report given to Cody , RN (oncoming nurse) by Narges, RN (offgoing nurse).  Report given with SBAR, Kardex, Intake/Output and MAR.

## 2015-12-20 NOTE — Progress Notes (Signed)
Bedside shift change report given to Liz (oncoming nurse) by Stephanie (offgoing nurse). Report included the following information SBAR and Kardex.

## 2015-12-20 NOTE — Progress Notes (Signed)
Problem: Mobility Impaired (Adult and Pediatric)  Goal: *Acute Goals and Plan of Care (Insert Text)  Physical Therapy Goals  Initiated 12/17/2015    1. Patient will move from supine to sit and sit to supine , scoot up and down and roll side to side in bed with modified independence within 4 days.  2. Patient will perform sit to stand with modified independence within 4 days.  3. Patient will ambulate with modified independence for 150 feet with the least restrictive device within 4 days.  4. Patient will ascend/descend 5 stairs with 2 handrail(s) with modified independence within 4 days.  5. Patient will perform THR home exercise program per protocol with modified independence within 4 days.   PHYSICAL THERAPY TREATMENT  Patient: Lindsay [name] ([age] y.o. female)  Date: 12/20/2015  Diagnosis: DJD  Primary osteoarthritis of right knee Primary osteoarthritis of right knee  Procedure(s) (LRB):  RIGHT TOTAL KNEE REPLACEMENT (Right) 3 Days Post-Op  Precautions: Fall, WBAT (low BP at baseline per patient report)      ASSESSMENT:  Patient is received in bed and ready for therapy, she is able to amb down to stairwell and back for similar distance as this morning and then also amb up 8 steps with railing.  There is no balance loss and no issues with attempting stairs - she has excellent recall of appropriate steps to take.  At this time, she can amb and do stairs so I think she is ready for DC home as long as she is medically cleared.  Spoke with nursing in regards to that and the fact that she is cleared by PT so I don't think she needs to be seen again if she is still here tomorrow.  Progression toward goals:  [X]       Improving appropriately and progressing toward goals  [ ]       Improving slowly and progressing toward goals  [ ]       Not making progress toward goals and plan of care will be adjusted       PLAN:  Patient continues to benefit from skilled intervention to address the  above impairments.  Continue treatment per established plan of care.  Discharge Recommendations:  Home Health and Outpatient  Further Equipment Recommendations for Discharge:  none       SUBJECTIVE:   Patient stated ???I'm glad I was able to do the stairs.Marland Kitchen.???      OBJECTIVE DATA SUMMARY:   Critical Behavior:  Neurologic State: Alert  Orientation Level: Oriented X4        Range of Motion:           RLE AROM  R Knee Flexion: 70  R Knee Extension: -8              Functional Mobility Training:  Bed Mobility:     Supine to Sit: Supervision  Sit to Supine: Supervision           Transfers:  Sit to Stand: Contact guard assistance  Stand to Sit: Contact guard assistance                             Balance:  Sitting: Intact  Standing: Intact;With support  Ambulation/Gait Training:  Distance (ft): 125 Feet (ft)  Assistive Device: Gait belt;Walker, rolling  Ambulation - Level of Assistance: Stand-by asssistance        Gait Abnormalities: Antalgic  Right Side Weight Bearing: As tolerated  Base of Support: Narrowed  Stance: Left decreased;Right decreased  Speed/Cadence: Slow  Step Length: Left shortened;Right shortened                             She has appropriate gait pattern considering she is just recently s/p TKR.  Stairs:  Number of Stairs Trained: 8  Stairs - Level of Assistance: Stand-by asssistance     Therapeutic Exercises:     EXERCISE   Sets   Reps   Active Active Assist   Passive Self ROM   Comments   Ankle Pumps 1 10                                                                                                                                                                     Quad Sets 1 10                                                                                                                                                                     Hamstring Sets                                                                                                                                Short Arc Quads     '[ ]'                                         '[ ]'                                         '[ ]'                                         '[ ]'                                             Knee Extension Stretch       '[ ]'                                           '[ ]'                                           '[ ]'                                           '[ ]'                                             Heel Slides 1 10 '[X]'                                         '[ ]'                                         '[ ]'                                         '[ ]'                                             Long Arc Quads     '[ ]'                                         '[ ]'                                         '[ ]'                                         '[ ]'   Knee Flexion Stretch     [ ]                                         [ ]                                         [ ]                                         [ ]                                             Straight Leg Raises     [ ]                                         [ ]                                         [ ]                                         [ ]                                                Pain:  Pain Scale 1: Numeric (0 - 10)  Pain Intensity 1: 3  Pain Location 1: Knee  Pain Orientation 1: Right  Pain Description 1: Aching  Pain Intervention(s) 1: Medication (see MAR);Ice  Activity Tolerance:   She tol well, back to bed with no complaints of pain, she reports ready for home  Please refer to the flowsheet for vital signs taken during this treatment.  After treatment:   [ ]  Patient left in no apparent distress sitting up in chair  [X]  Patient left in no apparent distress in bed  [X]  Call bell left within reach  [X]  Nursing notified  [X]  Caregiver present  [ ]  Bed alarm activated       COMMUNICATION/COLLABORATION:   The patient???s plan of care was discussed with: Registered Nurse     Fuller Canada, PT   Time Calculation: 18 mins

## 2015-12-20 NOTE — Progress Notes (Signed)
Note discharge today. [facility] on December 19, 2015. Contacted agency re: discharge today.  ([facility])  Elouise MunroeJoanne N Magdalena Skilton, LCSW

## 2015-12-20 NOTE — Progress Notes (Signed)
Bedside and Verbal shift change report given to Nargas (oncoming nurse) by Liz (offgoing nurse). Report included the following information SBAR.

## 2015-12-20 NOTE — Progress Notes (Signed)
TOTAL KNEE PROGRESS NOTE      Subjective:     Post-Operative Day: 3 Status Post right Total Knee Arthroplasty  Systemic or Specific Complaints:No Complaints    Objective:     Patient Vitals for the past 24 hrs:   BP Temp Pulse Resp SpO2   12/20/15 0811 (!) 86/61 98.1 ??F (36.7 ??C) 82 - 96 %   12/20/15 0227 106/64 98 ??F (36.7 ??C) 87 16 94 %   12/19/15 2029 95/56 98.4 ??F (36.9 ??C) 80 15 97 %   12/19/15 1410 91/50 98.6 ??F (37 ??C) 78 16 94 %       General: alert, cooperative, no distress, appears stated age   Wound: Wound clean and dry no evidence of infection., No Erythema, No Edema, No Drainage and Wound Intact   Motion: Extension: Full Extension   DVT Exam: No evidence of DVT seen on physical exam.  Negative Homan's sign.  No cords or calf tenderness.  No significant calf/ankle edema.     Data Review:    Recent Results (from the past 24 hour(s))   HEMOGLOBIN    Collection Time: 12/20/15  2:21 AM   Result Value Ref Range    HGB 7.6 (L) 11.5 - 16.0 g/dL         Assessment:     Status Post right Total Knee Arthroplasty. Doing well postoperatively. Bandage aquacell, clean/dry. Labs stable.  PT has been hold secondary to DVT presentation.  Appreciate hematology consult.  Hgb steady on the rise 7.6.  Pain control WNL.     Plan:     Altered Xarelto to hema recommendations.    Discussed blood with patient and she elects to proceed with 1 unit PRBCs transfusion.  Monitor for improvement and PT progress as tolerated.   Continues current post-op course  I would expect discharge tomorrow.

## 2015-12-21 ENCOUNTER — Inpatient Hospital Stay: Admit: 2015-12-21 | Payer: BLUE CROSS/BLUE SHIELD | Primary: Internal Medicine

## 2015-12-21 LAB — HEMOGLOBIN: HGB: 6.1 g/dL — ABNORMAL LOW (ref 11.5–16.0)

## 2015-12-21 LAB — OCCULT BLOOD, STOOL: Occult blood, stool: NEGATIVE

## 2015-12-21 MED ORDER — SODIUM CHLORIDE 0.9 % IV
INTRAVENOUS | Status: DC | PRN
Start: 2015-12-21 — End: 2015-12-22

## 2015-12-21 MED ORDER — SODIUM CHLORIDE 0.9 % IJ SYRG
Freq: Once | INTRAMUSCULAR | Status: AC
Start: 2015-12-21 — End: 2015-12-21
  Administered 2015-12-21: 17:00:00 via INTRAVENOUS

## 2015-12-21 MED ORDER — SODIUM CHLORIDE 0.9% BOLUS IV
0.9 % | Freq: Once | INTRAVENOUS | Status: AC
Start: 2015-12-21 — End: 2015-12-21
  Administered 2015-12-21: 17:00:00 via INTRAVENOUS

## 2015-12-21 MED ORDER — IOPAMIDOL 76 % IV SOLN
370 mg iodine /mL (76 %) | Freq: Once | INTRAVENOUS | Status: AC
Start: 2015-12-21 — End: 2015-12-21
  Administered 2015-12-21: 17:00:00 via INTRAVENOUS

## 2015-12-21 MED FILL — ROPINIROLE 1 MG TAB: 1 mg | ORAL | Qty: 2

## 2015-12-21 MED FILL — OXYCODONE 5 MG TAB: 5 mg | ORAL | Qty: 1

## 2015-12-21 MED FILL — BD POSIFLUSH NORMAL SALINE 0.9 % INJECTION SYRINGE: INTRAMUSCULAR | Qty: 10

## 2015-12-21 MED FILL — CELEBREX 200 MG CAPSULE: 200 mg | ORAL | Qty: 1

## 2015-12-21 MED FILL — XARELTO 10 MG TABLET: 10 mg | ORAL | Qty: 2

## 2015-12-21 MED FILL — ACETAMINOPHEN 325 MG TABLET: 325 mg | ORAL | Qty: 2

## 2015-12-21 MED FILL — DIAZEPAM 5 MG TAB: 5 mg | ORAL | Qty: 1

## 2015-12-21 MED FILL — SODIUM CHLORIDE 0.9 % IV: INTRAVENOUS | Qty: 250

## 2015-12-21 MED FILL — SENNA PLUS 8.6 MG-50 MG TABLET: ORAL | Qty: 1

## 2015-12-21 MED FILL — OXYCODONE 5 MG TAB: 5 mg | ORAL | Qty: 2

## 2015-12-21 MED FILL — PANTOPRAZOLE 40 MG TAB, DELAYED RELEASE: 40 mg | ORAL | Qty: 1

## 2015-12-21 MED FILL — ESCITALOPRAM 10 MG TAB: 10 mg | ORAL | Qty: 2

## 2015-12-21 MED FILL — POLYETHYLENE GLYCOL 3350 17 GRAM (100 %) ORAL POWDER PACKET: 17 gram | ORAL | Qty: 1

## 2015-12-21 NOTE — Consults (Signed)
[facility]   [address]   [place], [place] [address]   CONSULTATION       Name:  Lindsay [name], Lindsay [name]   MR#:  [id number]   DOB:  [birth year]   Account #:  [id number]    Date of Consultation:  12/21/2015   Date of Adm:  12/17/2015       PRIMARY CARE PHYSICIAN:Francis Sherrine Maples, MD    REFERRING PHYSICIAN:  Physician requesting consultation is Dr.   Glendon Axe.    REASON FOR CONSULTATION: Chest discomfort.    HISTORY OF PRESENT ILLNESS: The patient is a 60 year old   female with a past medical history of pulmonary embolism x2 as well   as hyperlipidemia, depression, osteoarthritis, restless leg syndrome,   who presents for right total knee arthroplasty, which was done on   12/17/2015 by Dr. Glendon Axe. The patient initially did well after surgery;   however, on the 16th, she began to experience acute pain in her right   leg after working with physical therapy and walking. She had a lower   extremity Doppler at that point, which revealed a right deep venous   thrombosis in her popliteal and posterior tibial veins. She was seen by   Dr. Angelyn Punt from Hematology, who recommended that her Xarelto, which   had been started for DVT prophylaxis be switched to treatment dose   for deep venous thrombosis. The patient was placed on Xarelto and   did well with this. Unfortunately, after working with PT yesterday she   walked up some stairs and noticed that her right calf had increasing   pain. Overnight, she notes that her chest felt uncomfortable. She felt   short of breath, as well as some substernal chest pain. This resolved   without any intervention. She notes that during her previous PEs, she   had a "football on my chest" that was not letting me breathe right.    Currently she has no pleuritic chest pain. No feelings like she cannot   take a deep breath. She is not coughing. There is no hemoptysis. Of   note, the patient has had worsening anemia. Initially she was hesitant   to take a blood transfusion, but is fine with  that now. She notes that her   stools have been brown and normal. Her knee is painful and she does   feel like her upper leg as well as her lower leg are swollen more than   they have been.  She has not had any hematuria. She never had a   history of gastrointestinal bleed. She is on celecoxib for her post-  surgical pain.    PAST MEDICAL HISTORY   1. 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   2. Hyperlipidemia.   3. Depression.   4. Osteoarthritis.   5. Restless leg syndrome.    MEDICATIONS: Reviewed.    ALLERGIES: NO KNOWN DRUG ALLERGIES.    REVIEW OF SYSTEMS   The patient denies any nausea, vomiting, abdominal pain, or diarrhea.   She further denies any headache, dizziness or vision changes.   Otherwise, 10-point review of systems is negative except for as   mentioned in HPI and past medical history.    FAMILY HISTORY: Reviewed for PE. It is noncontributory.    SOCIAL HISTORY: The patient lives at home. No tobacco, no alcohol,   no illicit drug use.  PHYSICAL EXAMINATION   VITAL SIGNS:  Temperature is 36.6, pulse 87, blood pressure 102/65,   respirations 16, oxygen saturation 94% on room air.   GENERAL APPEARANCE: The patient is a very pleasant woman in no   acute distress.   EYES: Pupils equal, round, reactive to light. Extraocular movements   intact. No scleral icterus. No conjunctival pallor.   ENT: Mucous membranes are moist. Oropharynx is benign.   NECK: Supple, no jugular venous distention, no thyromegaly.   HEART: Regular rate and rhythm without murmurs, rubs or gallops.   RESPIRATORY: Lungs are clear to auscultation bilaterally.   GI: Abdomen soft, nontender, nondistended with positive bowel   sounds.   GU: No costovertebral angle tenderness. Bladder is nonpalpable in   lower abdomen.   SKIN: No rashes, pallor or jaundice.   MUSCULOSKELETAL: The patient's right leg is swollen with 1+   edema in lower  extremity. Her surgical wound on her right knee is   dressed. She does have some mild swelling in her right thigh, but the   muscle is supple.  The skin is not tense.   NEUROLOGIC: The patient is alert and oriented x3. Cranial nerves 2   through 12 are intact. She is moving all 4 extremities without focal   deficits, taking into account the pain that she has in her right leg after   surgery and DVT.    SIGNIFICANT LABORATORIES: Hemoglobin 6.1. On the 15th, Her   BUN was 9 and creatinine 0.59.    OTHER STUDIES: Duplex of the lower extremity done on the 16th,   reveals evidence of deep vein thrombosis, more consistent with acute   and chronic process.    ASSESSMENT/PLAN:  The patient is a 60 year old female who we are   asked to consult on for deep vein thrombosis, anemia and chest pain.    PROBLEMS   1. Deep vein thrombosis.  The patient has an acute deep vein   thrombosis.  She has already been seen by Hematology, I agree with   their recommendation of Xarelto. At this point, I see no acute bleeding,   but I do think it would be reasonable to monitor her blood counts after   transfusion and consider an inferior vena cava filter should we need to   stop anticoagulation.  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  It is also interesting to note that in 2014, she did   have a positive lupus anticoagulant with DRVVT that did not correct   with a mixing study and this probably should be rechecked.   2. Acute blood loss anemia on chronic. The patient had a decrease in   her hemoglobin, but it has been relatively stable. I do think a blood   transfusion would give her a little more energy, and agree with the plan   to transfuse her. Right now on exam there appears to be no acute   bleeding in either her knee or her thigh. She has had brown stools, but   I think checking a fecal occult blood test, especially since she was on a    nonsteroidal anti-inflammatory drug and anticoagulation, would be   reasonable. I would like to stop the celecoxib to prevent any ulcer from   developing. She has no hematuria. No signs of blood loss in the   abdominal cavity or retroperitoneum.   3. Chest pain.  The patient's chest pain is fairly nonspecific. Certainly it   could be due to anemia and a little bit of increased coronary demand. I   would like to check an EKG just to look for signs of strain versus   ischemia. Certainly with her deep vein thrombosis, she could have had   a pulmonary embolism that caused her symptoms, but she was on   Xarelto. I think checking a CTA just for completeness sake is   Reasonable, though it would not change management much.    Thank you very much for consulting me on this very pleasant patient.  I   will continue to follow along with you.        Lorna Few, MD      WT / DV   D:  12/21/2015   10:30   T:  12/21/2015   12:30   Job #:  [id number]

## 2015-12-21 NOTE — Progress Notes (Addendum)
Still has calf pain. Calf sofy nausea or vomiting inyact. Dressing dry. Will tx 2 units rbc. No sob but had some chest discomfort last pm. Will obtain chest ct.

## 2015-12-21 NOTE — Progress Notes (Signed)
Bedside and Verbal shift change report given to Nargas (oncoming nurse) by Liz (offgoing nurse). Report included the following information SBAR.

## 2015-12-21 NOTE — Progress Notes (Signed)
I talked with [name] and received and order for 2 units of blood.  The patient's HGB is 6.1 today. He also wanted to consult the Hospitalist.

## 2015-12-21 NOTE — Progress Notes (Signed)
Consult dictated [id number]    DVT - agree with hematology's plan. If she continued to drop her blood count, could place an IVC filter and stop xarelto  Anemia - agree with transfusion, no signs of acute bleeding but will stop celecoxib and check FOBT  Chest pain - suspect anxiety versus anemia. Check ECG and CTA chest.    Lorna FewWilliam A Harmon Bommarito, MD

## 2015-12-21 NOTE — Progress Notes (Signed)
Bedside shift change report given to Liz RN (oncoming nurse) by Cody RN (offgoing nurse). Report included the following information SBAR, Kardex, Intake/Output and MAR.

## 2015-12-21 NOTE — Consults (Signed)
Lindsay [name]   [address]   [place], [place] [id number]   CONSULTATION       Name:  Lindsay BillingsRKER, [name]   MR#:  [id number]   DOB:  [birth year]   Account #:  [id number]    Date of Consultation:  12/21/2015   Date of Adm:  12/17/2015       PRIMARY CARE PHYSICIAN:[name], MD    REFERRING PHYSICIAN:  Physician requesting consultation is Dr.   Glendon AxeGarnett.    REASON FOR CONSULTATION: Chest discomfort.    HISTORY OF PRESENT ILLNESS: The patient is a 60 year old   female with a past medical history of pulmonary embolism x2 as well   as hyperlipidemia, depression, osteoarthritis, restless leg syndrome,   who presents for right total knee arthroplasty, which was done on   12/17/2015 by [name]. The patient initially did well after surgery;   however, on the 16th, she began to experience acute pain in her right   leg after working with physical therapy and walking. She had a lower   extremity Doppler at that point, which revealed a right deep venous   thrombosis in her popliteal and posterior tibial veins. She was seen by   Dr. Angelyn Puntaja from Hematology, who recommended that her Xarelto, which   had been started for DVT prophylaxis be switched to treatment dose   for deep venous thrombosis. The patient was placed on Xarelto and   did well with this. Unfortunately, after working with PT yesterday she   walked up some stairs and noticed that her right calf had increasing   pain. Overnight, she notes that her chest felt uncomfortable. She felt   short of breath, as well as some substernal chest pain. This resolved   without any intervention. She notes that during her previous PEs, she   had a "football on my chest" that was not letting me breathe right.    Currently she has no pleuritic chest pain. No feelings like she cannot   take a deep breath. She is not coughing. There is no hemoptysis. Of   note, the patient has had worsening anemia. Initially she was hesitant    to take a blood transfusion, but is fine with that now. She notes that her   stools have been brown and normal. Her knee is painful and she does   feel like her upper leg as well as her lower leg are swollen more than   they have been.  She has not had any hematuria. She never had a   history of gastrointestinal bleed. She is on celecoxib for her post-  surgical pain.    PAST MEDICAL HISTORY   1. 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   2. Hyperlipidemia.   3. Depression.   4. Osteoarthritis.   5. Restless leg syndrome.    MEDICATIONS: Reviewed.    ALLERGIES: NO KNOWN DRUG ALLERGIES.    REVIEW OF SYSTEMS   The patient denies any nausea, vomiting, abdominal pain, or diarrhea.   She further denies any headache, dizziness or vision changes.   Otherwise, 10-point review of systems is negative except for as   mentioned in HPI and past medical history.    FAMILY HISTORY: Reviewed for PE. It is noncontributory.    SOCIAL HISTORY: The patient lives at home. No tobacco, no alcohol,   no illicit drug use.  PHYSICAL EXAMINATION   VITAL SIGNS:  Temperature is 36.6, pulse 87, blood pressure 102/65,   respirations 16, oxygen saturation 94% on room air.   GENERAL APPEARANCE: The patient is a very pleasant woman in no   acute distress.   EYES: Pupils equal, round, reactive to light. Extraocular movements   intact. No scleral icterus. No conjunctival pallor.   ENT: Mucous membranes are moist. Oropharynx is benign.   NECK: Supple, no jugular venous distention, no thyromegaly.   HEART: Regular rate and rhythm without murmurs, rubs or gallops.   RESPIRATORY: Lungs are clear to auscultation bilaterally.   GI: Abdomen soft, nontender, nondistended with positive bowel   sounds.   GU: No costovertebral angle tenderness. Bladder is nonpalpable in   lower abdomen.   SKIN: No rashes, pallor or jaundice.    MUSCULOSKELETAL: The patient's right leg is swollen with 1+   edema in lower extremity. Her surgical wound on her right knee is   dressed. She does have some mild swelling in her right thigh, but the   muscle is supple.  The skin is not tense.   NEUROLOGIC: The patient is alert and oriented x3. Cranial nerves 2   through 12 are intact. She is moving all 4 extremities without focal   deficits, taking into account the pain that she has in her right leg after   surgery and DVT.    SIGNIFICANT LABORATORIES: Hemoglobin 6.1. On the 15th, Her   BUN was 9 and creatinine 0.59.    OTHER STUDIES: Duplex of the lower extremity done on the 16th,   reveals evidence of deep vein thrombosis, more consistent with acute   and chronic process.    ASSESSMENT/PLAN:  The patient is a 60 year old female who we are   asked to consult on for deep vein thrombosis, anemia and chest pain.    PROBLEMS   1. Deep vein thrombosis.  The patient has an acute deep vein   thrombosis.  She has already been seen by Hematology, I agree with   their recommendation of Xarelto. At this point, I see no acute bleeding,   but I do think it would be reasonable to monitor her blood counts after   transfusion and consider an inferior vena cava filter should we need to   stop anticoagulation.  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  It is also interesting to note that in 2014, she did   have a positive lupus anticoagulant with DRVVT that did not correct   with a mixing study and this probably should be rechecked.   2. Acute blood loss anemia on chronic. The patient had a decrease in   her hemoglobin, but it has been relatively stable. I do think a blood   transfusion would give her a little more energy, and agree with the plan   to transfuse her. Right now on exam there appears to be no acute   bleeding in either her knee or her thigh. She has had brown stools, but    I think checking a fecal occult blood test, especially since she was on a   nonsteroidal anti-inflammatory drug and anticoagulation, would be   reasonable. I would like to stop the celecoxib to prevent any ulcer from   developing. She has no hematuria. No signs of blood loss in the   abdominal cavity or retroperitoneum.   3. Chest pain.  The patient's chest pain is fairly nonspecific. Certainly it   could be due to anemia and a little bit of increased coronary demand. I   would like to check an EKG just to look for signs of strain versus   ischemia. Certainly with her deep vein thrombosis, she could have had   a pulmonary embolism that caused her symptoms, but she was on   Xarelto. I think checking a CTA just for completeness sake is   Reasonable, though it would not change management much.    Thank you very much for consulting me on this very pleasant patient.  I   will continue to follow along with you.        Lorna Few, MD      WT / DV   D:  12/21/2015   10:30   T:  12/21/2015   12:30   Job #:  [id number]

## 2015-12-21 NOTE — Progress Notes (Signed)
Talked with Morrie SheldonAshley from [facility] and a MD will be coming to follow up on a new consult today.

## 2015-12-21 NOTE — Progress Notes (Signed)
I received the report from the radiologist that the results of the patient's recent CT with contrast was positive for a PE in the left lower lobe.       I reported the results to [name] and also to Dr. Lawernce Ionhistlewaite.

## 2015-12-21 NOTE — Progress Notes (Signed)
Physical Therapy    Reviewed chart and discussed with nurse. Marisue IvanLiz, RN requesting to hold due to decrease HGB 6.1 and increase right LE pain. Will defer at this time.

## 2015-12-22 LAB — TYPE AND SCREEN
ABO/Rh: AB POS
Antibody Screen: NEGATIVE
Status: TRANSFUSED
Status: TRANSFUSED
Unit Divison: 0
Unit Divison: 0

## 2015-12-22 LAB — EKG 12-LEAD
Atrial Rate: 72 {beats}/min
Diagnosis: NORMAL
P Axis: 20 degrees
P-R Interval: 138 ms
Q-T Interval: 422 ms
QRS Duration: 76 ms
QTc Calculation (Bazett): 462 ms
R Axis: 26 degrees
T Axis: 38 degrees
Ventricular Rate: 72 {beats}/min

## 2015-12-22 LAB — TYPE & SCREEN
ABO/Rh(D): AB POS
Antibody screen: NEGATIVE
Status of unit: TRANSFUSED
Status of unit: TRANSFUSED
Unit division: 0
Unit division: 0

## 2015-12-22 LAB — EKG, 12 LEAD, INITIAL
Atrial Rate: 72 {beats}/min
Calculated P Axis: 20 degrees
Calculated R Axis: 26 degrees
Calculated T Axis: 38 degrees
Diagnosis: NORMAL
P-R Interval: 138 ms
Q-T Interval: 422 ms
QRS Duration: 76 ms
QTC Calculation (Bezet): 462 ms
Ventricular Rate: 72 {beats}/min

## 2015-12-22 LAB — HEMOGLOBIN: HGB: 8.9 g/dL — ABNORMAL LOW (ref 11.5–16.0)

## 2015-12-22 MED FILL — OXYCODONE 5 MG TAB: 5 mg | ORAL | Qty: 2

## 2015-12-22 MED FILL — ACETAMINOPHEN 325 MG TABLET: 325 mg | ORAL | Qty: 2

## 2015-12-22 MED FILL — XARELTO 10 MG TABLET: 10 mg | ORAL | Qty: 2

## 2015-12-22 MED FILL — PANTOPRAZOLE 40 MG TAB, DELAYED RELEASE: 40 mg | ORAL | Qty: 1

## 2015-12-22 MED FILL — POLYETHYLENE GLYCOL 3350 17 GRAM (100 %) ORAL POWDER PACKET: 17 gram | ORAL | Qty: 1

## 2015-12-22 MED FILL — BD POSIFLUSH NORMAL SALINE 0.9 % INJECTION SYRINGE: INTRAMUSCULAR | Qty: 10

## 2015-12-22 MED FILL — DIAZEPAM 5 MG TAB: 5 mg | ORAL | Qty: 1

## 2015-12-22 MED FILL — SENNA PLUS 8.6 MG-50 MG TABLET: ORAL | Qty: 1

## 2015-12-22 MED FILL — ESCITALOPRAM 10 MG TAB: 10 mg | ORAL | Qty: 2

## 2015-12-22 NOTE — Discharge Summary (Signed)
Discharge Summary    Physician: Mardi MainlandLockett W. [name], MD    Physician Assistant: Robley FriesShea Dean Wonder, PA-C    Admit Diagnosis:  DJD  Primary osteoarthritis of right knee    Final Diagnosis:   1. Advanced degenerative arthritis of right knee .    Procedure: Right total knee replacement    Complications: None.    History of Present Illness: The patient has a long-standing history of pain within the right knee . The patient has severe degenerative arthritis of the right knee and has exhausted conservative therapy at this time including prior intra-articular injections, activity modifications, OTC NSAIDS. The patient has been limited in their ability to perform ADLs, walk short distances or climb steps appropriately.  The patient presents for the above-mentioned procedure. The patient has been cleared from a medical and cardiac standpoint.    Past Medical History:  Recorded in the chart.    Physical Examination: Also recorded in the chart.  The patient is noted for ambulating with an antalgic gait favoring the right side.  Examination of the right knee reveals she lacks 4 degrees full extension, flexes 117 degrees. There is diffuse tenderness and crepitance to range of motion of the right knee. No effusion .  X-rays reveal near complete loss of medial joint space narrowing. Also significant patellofemoral degenerative changes.    Course in the Hospital:  The patient was admitted to the hospital.  The Pt. Underwent a right total knee replacement .  Postoperatively, the pt. did well. The pt. Remained stable from a medical standpoint.  The patient ambulated, WBAT weightbearing within the hospital with Physical Therapy. The patient [facility].  The patient began taking Coumadin pre-operatively for DVT prophylaxis and will continue on this for one month. At the time of discharge, there was no evidence of any DVT noted.  The patient had negative Homans signs bilateral lower extremities.  At the  time of discharge, the patient's right knee incision appeared with staples intact.  No signs of infection or inflammation noted. The patient will follow up in our office in 2-1/2 weeks.    DC med list:  Discharge Medication List as of 12/22/2015 11:25 AM      START taking these medications    Details   rivaroxaban (XARELTO) 15 mg tab tablet Take 1 Tab by mouth two (2) times daily (with meals) for 21 days., Print, Disp-42 Tab, R-0      oxyCODONE IR (ROXICODONE) 5 mg immediate release tablet Take 1-2 Tabs by mouth every four (4) hours as needed for Pain. Max Daily Amount: 60 mg., Print, Disp-80 Tab, R-0      celecoxib (CELEBREX) 200 mg capsule Take 1 Cap by mouth daily for 30 days., Print, Disp-30 Cap, R-0         CONTINUE these medications which have NOT CHANGED    Details   acetaminophen (TYLENOL) 325 mg tablet Take 650 mg by mouth every four (4) hours as needed for Pain., Historical Med      diazePAM (VALIUM) 5 mg tablet Take 5 mg by mouth every eight (8) hours as needed for Anxiety., Historical Med      rOPINIRole (REQUIP) 1 mg tablet Take 1.5 tab 1-3 hours before bedtime, Normal, Disp-45 Tab, R-2      escitalopram oxalate (LEXAPRO) 20 mg tablet Take 1 Tab by mouth daily., Normal, Disp-30 Tab, R-5      omeprazole (PRILOSEC) 20 mg capsule Take 20 mg by  mouth nightly., Historical Med      ergocalciferol (ERGOCALCIFEROL) 50,000 unit capsule Take 1 Cap by mouth every seven (7) days., Normal, Disp-12 Cap, R-1         STOP taking these medications       ibuprofen (MOTRIN IB) 200 mg tablet Comments:   Reason for Stopping:               Patient Disposition: Home  Followup Care:  Discharge Condition: good  PT/OT per Home Health  Regular Diet  As directed    Follow-up Information     Follow up With Details Comments Contact Info    Clarene Critchley, MD   [address] [facility]  [address]  [place] [address]  [phone]      AT HOME CARE On 12/20/2015 home health  [address]  [place] [address]  [phone]                   Robley Fries, PA-C

## 2015-12-22 NOTE — Telephone Encounter (Signed)
Called pt to schedule a hospital follow up appointment with Dr. Skipper Clicheaddin and left a voicemail. Awaiting pts return call.

## 2015-12-22 NOTE — Progress Notes (Addendum)
Knee ok. No sob. Calf pain somewhat less. Working on pt.

## 2015-12-22 NOTE — Progress Notes (Signed)
Hospitalist Progress Note  Lilia Pro, NP  Office: [phone]  Cell: [phone]      Date of Service:  12/22/2015  NAME:  Lindsay [name]  DOB:  04-03-1956  MRN:  [id number]      Admission Summary:   The patient is a 60 year old   female with a past medical history of pulmonary embolism x2 as well   as hyperlipidemia, depression, osteoarthritis, restless leg syndrome,   who presents for right total knee arthroplasty, which was done on   2016/01/13 by Dr. Glendon Axe. The patient initially did well after surgery;   however, on the 16th, she began to experience acute pain in her right   leg after working with physical therapy and walking. She had a lower   extremity Doppler at that point, which revealed a right deep venous   thrombosis in her popliteal and posterior tibial veins. She was seen by   Dr. Angelyn Punt from Hematology, who recommended that her Xarelto, which   had been started for DVT prophylaxis be switched to treatment dose   for deep venous thrombosis. The patient was placed on Xarelto and   did well with this. Unfortunately, after working with PT yesterday she   walked up some stairs and noticed that her right calf had increasing   pain. Overnight, she notes that her chest felt uncomfortable. She felt   short of breath, as well as some substernal chest pain. This resolved   without any intervention. She notes that during her previous PEs, she   had a "football on my chest" that was not letting me breathe right.   Currently she has no pleuritic chest pain. No feelings like she cannot   take a deep breath. She is not coughing. There is no hemoptysis. Of   note, the patient has had worsening anemia. Initially she was hesitant   to take a blood transfusion, but is fine with that now. She notes that her   stools have been brown and normal. Her knee is painful and she does   feel like her upper leg as well as her lower leg are swollen more than    they have been. She has not had any hematuria. She never had a   history of gastrointestinal bleed. She is on celecoxib for her post-  surgical pain.  ??    Interval history / Subjective:     CTA revealed LLL PE. Discussed with patient. Denies shortness of breath and chest pain. We will sign off. Patient will need Hem/Onc outpatient fu.      Assessment & Plan:     Right Leg DVT/ LLL PE  confirmed by Duplex and CTA  provoked but second occurrence  Will need fu with Hem/Onc  Xarelto 15 mg bid x 21 days  Then resume 20 mg daily, length to be determined by Hematology    Acute Blood Loss Anemia  Stable hemoglobin    Chest Pain  Resolved  EKG reviewed  Not tachycardic, no need for echo per Dr. Maryjean Ka  Code status: Full  DVT prophylaxis: Xarelto    Care Plan discussed with: Patient/Family, Nurse and Case Manager  Disposition: Home w/Family and TBD     Hospital Problems  Date Reviewed: 13-Jan-2016          Codes Class Noted POA    * (Principal)Primary osteoarthritis of right knee ICD-10-CM: M17.11  ICD-9-CM: 715.16  12/16/2015 Yes  Review of Systems:   A comprehensive review of systems was negative except for that written in the HPI.       Vital Signs:    Last 24hrs VS reviewed since prior progress note. Most recent are:  Visit Vitals   ??? BP 90/53   ??? Pulse 77   ??? Temp 98.4 ??F (36.9 ??C)   ??? Resp 16   ??? Ht  (1.6 m)   ??? Wt 71.2 kg (157 lb)   ??? SpO2 94%   ??? BMI 27.81 kg/m2         Intake/Output Summary (Last 24 hours) at 12/22/15 1053  Last data filed at 12/21/15 1730   Gross per 24 hour   Intake              740 ml   Output                0 ml   Net              740 ml        Physical Examination:             Constitutional:  No acute distress, cooperative, pleasant??   ENT:  Oral mucous moist, oropharynx benign. Neck supple,    Resp:  CTA bilaterally. No wheezing/rhonchi/rales. No accessory muscle use   CV:  Regular rhythm, normal rate, no murmurs, gallops, rubs     GI:  Soft, non distended, non tender. normoactive bowel sounds, no hepatosplenomegaly     Musculoskeletal:  mild  r leg edema, warm, 2+ pulses throughout    Neurologic:  Moves all extremities.  AAOx3,      Psych:  Good insight, Not anxious nor agitated.  Skin:  Good turgor, no rashes or ulcers, dressings intact  Hematologic/Lymphatic/Immunlogic:  No jaundice nor lymph node swelling       Data Review:    Review and/or order of clinical lab test  Review and/or order of tests in the radiology section of CPT      Labs:     Recent Labs      12/22/15   0300  12/21/15   0651   HGB  8.9*  6.1*     No results for input(s): NA, K, CL, CO2, BUN, CREA, GLU, CA, MG, PHOS, URICA in the last 72 hours.  No results for input(s): SGOT, GPT, ALT, AP, TBIL, TBILI, TP, ALB, GLOB, GGT, AML, LPSE in the last 72 hours.    No lab exists for component: AMYP, HLPSE  No results for input(s): INR, PTP, APTT in the last 72 hours.    No lab exists for component: INREXT   No results for input(s): FE, TIBC, PSAT, FERR in the last 72 hours.   No results found for: FOL, RBCF   No results for input(s): PH, PCO2, PO2 in the last 72 hours.  No results for input(s): CPK, CKNDX, TROIQ in the last 72 hours.    No lab exists for component: CPKMB  Lab Results   Component Value Date/Time    Cholesterol, total 274 11/06/2014 03:35 PM    HDL Cholesterol 58 11/06/2014 03:35 PM    LDL, calculated 157 11/06/2014 03:35 PM    Triglyceride 297 11/06/2014 03:35 PM    CHOL/HDL Ratio 3.1 04/01/2009 02:15 PM     Lab Results   Component Value Date/Time    Glucose (POC) 111 12/17/2015 07:02 AM    Glucose (POC) 107 10/29/2015 08:03 AM  Lab Results   Component Value Date/Time    Color YELLOW/STRAW 12/10/2015 10:43 AM    Appearance CLEAR 12/10/2015 10:43 AM    Specific gravity 1.023 12/10/2015 10:43 AM    pH (UA) 8.0 12/10/2015 10:43 AM    Protein NEGATIVE  12/10/2015 10:43 AM    Glucose NEGATIVE  12/10/2015 10:43 AM    Ketone NEGATIVE  12/10/2015 10:43 AM     Bilirubin NEGATIVE  12/10/2015 10:43 AM    Urobilinogen 0.2 12/10/2015 10:43 AM    Nitrites NEGATIVE  12/10/2015 10:43 AM    Leukocyte Esterase NEGATIVE  12/10/2015 10:43 AM    Epithelial cells FEW 12/10/2015 10:43 AM    Bacteria NEGATIVE  12/10/2015 10:43 AM    WBC 0-4 12/10/2015 10:43 AM    RBC 0-5 12/10/2015 10:43 AM         Medications Reviewed:     Current Facility-Administered Medications   Medication Dose Route Frequency   ??? 0.9% sodium chloride infusion 250 mL  250 mL IntraVENous PRN   ??? rivaroxaban (XARELTO) tablet 15 mg  15 mg Oral BID WITH MEALS   ??? ondansetron (ZOFRAN) injection 4 mg  4 mg IntraVENous Q4H PRN   ??? sodium chloride 0.9 % bolus infusion 500 mL  500 mL IntraVENous ONCE PRN   ??? sodium chloride (NS) flush 5-10 mL  5-10 mL IntraVENous Q8H   ??? sodium chloride (NS) flush 5-10 mL  5-10 mL IntraVENous PRN   ??? acetaminophen (TYLENOL) tablet 650 mg  650 mg Oral Q6H   ??? oxyCODONE IR (ROXICODONE) tablet 5 mg  5 mg Oral Q3H PRN   ??? oxyCODONE IR (ROXICODONE) tablet 10 mg  10 mg Oral Q3H PRN   ??? naloxone (NARCAN) injection 0.4 mg  0.4 mg IntraVENous PRN   ??? hydrOXYzine HCl (ATARAX) tablet 10 mg  10 mg Oral Q8H PRN   ??? famotidine (PEPCID) tablet 20 mg  20 mg Oral BID PRN   ??? bisacodyl (DULCOLAX) suppository 10 mg  10 mg Rectal DAILY PRN   ??? diazePAM (VALIUM) tablet 5 mg  5 mg Oral Q8H PRN   ??? escitalopram oxalate (LEXAPRO) tablet 20 mg  20 mg Oral QHS   ??? pantoprazole (PROTONIX) tablet 40 mg  40 mg Oral QHS   ??? senna-docusate (PERICOLACE) 8.6-50 mg per tablet 1 Tab  1 Tab Oral BID   ??? polyethylene glycol (MIRALAX) packet 17 g  17 g Oral DAILY   ??? rOPINIRole (REQUIP) tablet 1.5 mg  1.5 mg Oral QPM     ______________________________________________________________________  EXPECTED LENGTH OF STAY: 2d 14h  ACTUAL LENGTH OF STAY:          5                 Lilia Proenee E Cynthia Cogle, NP

## 2015-12-22 NOTE — Discharge Summary (Signed)
Discharge Summary    Physician: Mardi MainlandLockett W. [name], MD    Physician Assistant: Robley FriesShea Chany Woolworth, PA-C    Admit Diagnosis:  DJD  Primary osteoarthritis of right knee    Final Diagnosis:   1. Advanced degenerative arthritis of right knee .    Procedure: Right total knee replacement    Complications: None.    History of Present Illness: The patient has a long-standing history of pain within the right knee . The patient has severe degenerative arthritis of the right knee and has exhausted conservative therapy at this time including prior intra-articular injections, activity modifications, OTC NSAIDS. The patient has been limited in their ability to perform ADLs, walk short distances or climb steps appropriately.  The patient presents for the above-mentioned procedure. The patient has been cleared from a medical and cardiac standpoint.    Past Medical History:  Recorded in the chart.    Physical Examination: Also recorded in the chart.  The patient is noted for ambulating with an antalgic gait favoring the right side.  Examination of the right knee reveals she lacks 4 degrees full extension, flexes 117 degrees. There is diffuse tenderness and crepitance to range of motion of the right knee. No effusion .  X-rays reveal near complete loss of medial joint space narrowing. Also significant patellofemoral degenerative changes.    Course in the Hospital:  The patient was admitted to the hospital.  The Pt. Underwent a right total knee replacement .  Postoperatively, the pt. did well. The pt. Remained stable from a medical standpoint.  The patient ambulated, WBAT weightbearing within the hospital with Physical Therapy. [facility].  The patient began taking Coumadin pre-operatively for DVT prophylaxis and will continue on this for one month. At the time of discharge, there was no evidence of any DVT noted.  The patient had negative Homans signs  bilateral lower extremities.  At the time of discharge, the patient's right knee incision appeared with staples intact.  No signs of infection or inflammation noted. The patient will follow up in our office in 2-1/2 weeks.    DC med list:  Discharge Medication List as of 12/22/2015 11:25 AM      START taking these medications    Details   rivaroxaban (XARELTO) 15 mg tab tablet Take 1 Tab by mouth two (2) times daily (with meals) for 21 days., Print, Disp-42 Tab, R-0      oxyCODONE IR (ROXICODONE) 5 mg immediate release tablet Take 1-2 Tabs by mouth every four (4) hours as needed for Pain. Max Daily Amount: 60 mg., Print, Disp-80 Tab, R-0      celecoxib (CELEBREX) 200 mg capsule Take 1 Cap by mouth daily for 30 days., Print, Disp-30 Cap, R-0         CONTINUE these medications which have NOT CHANGED    Details   acetaminophen (TYLENOL) 325 mg tablet Take 650 mg by mouth every four (4) hours as needed for Pain., Historical Med      diazePAM (VALIUM) 5 mg tablet Take 5 mg by mouth every eight (8) hours as needed for Anxiety., Historical Med      rOPINIRole (REQUIP) 1 mg tablet Take 1.5 tab 1-3 hours before bedtime, Normal, Disp-45 Tab, R-2      escitalopram oxalate (LEXAPRO) 20 mg tablet Take 1 Tab by mouth daily., Normal, Disp-30 Tab, R-5      omeprazole (PRILOSEC) 20 mg capsule Take 20 mg by  mouth nightly., Historical Med      ergocalciferol (ERGOCALCIFEROL) 50,000 unit capsule Take 1 Cap by mouth every seven (7) days., Normal, Disp-12 Cap, R-1         STOP taking these medications       ibuprofen (MOTRIN IB) 200 mg tablet Comments:   Reason for Stopping:               Patient Disposition: Home  Followup Care:  Discharge Condition: good  PT/OT per Home Health  Regular Diet  As directed    Follow-up Information     Follow up With Details Comments Contact Info    Clarene Critchley, MD   [address] [facility]  [address]  [place] [address]  [phone]      AT HOME CARE On 12/20/2015 home health  [address]   [place] [address]  [phone]                  Robley Fries, PA-C

## 2015-12-22 NOTE — Telephone Encounter (Signed)
-----   Message from Tomasa HoseJason A Heiser sent at 12/20/2015  1:44 PM EDT -----  Regarding: Dr. Phineas DouglasGlym / Telephone  [facility] with a blood clot in her leg and her hemoglobin was low. The Hospital thought about giving her blood to resolve this issue but decided not to. Pt was wondering if she should leave the hospital as she has been medically cleared. Pt is still in a lot of pain around her knee area and she speak to her doctor or nurse and pt best contact number is [phone]

## 2015-12-22 NOTE — Progress Notes (Signed)
Bedside shift change report given to Betty , RN (oncoming nurse) by Narges, RN (offgoing nurse).  Report given with SBAR, Kardex, Intake/Output and MAR.

## 2015-12-22 NOTE — Progress Notes (Signed)
Problem: Mobility Impaired (Adult and Pediatric)  Goal: *Acute Goals and Plan of Care (Insert Text)  Physical Therapy Goals  Initiated 12/17/2015    1. Patient will move from supine to sit and sit to supine , scoot up and down and roll side to side in bed with modified independence within 4 days.  2. Patient will perform sit to stand with modified independence within 4 days.  3. Patient will ambulate with modified independence for 150 feet with the least restrictive device within 4 days.  4. Patient will ascend/descend 5 stairs with 2 handrail(s) with modified independence within 4 days.  5. Patient will perform THR home exercise program per protocol with modified independence within 4 days.   PHYSICAL THERAPY TREATMENT: WEEKLY REASSESSMENT  Patient: Lindsay [name] (60 y.o. female)  Date: 12/22/2015  Diagnosis: DJD  Primary osteoarthritis of right knee Primary osteoarthritis of right knee  Procedure(s) (LRB):  RIGHT TOTAL KNEE REPLACEMENT (Right) 5 Days Post-Op  Precautions: Fall, WBAT (low BP at baseline per patient report)      ASSESSMENT:  Patient continues to make progress with her mobility and ROM despite DVT and PE.  Her sat's and HR were stable at 77-80 and 94% on room air with activity.  Her ROM is 0-90 with assist for the flexion, but she is able to quad set with ankle DF to neutral on her own.  Her gait is considerably less painful than previous session, and she was minimally antalgic with the RW.  She worked on exercises in bed and in the chair pre and post walk.  She is clear for D/C from PT standpoint, but we will continue to see BID until D/C home.  Patient's progression toward goals since last assessment: RLE DVT and L side PE       PLAN:  Goals have been updated based on progression since last assessment.  Patient continues to benefit from skilled intervention to address the above impairments.  Continue to follow the patient twice daily to address goals.  Planned Interventions:   [ ]                  Bed Mobility Training             [ ]           Neuromuscular Re-Education  [ ]                  Transfer Training                   [ ]           Orthotic/Prosthetic Training  [ ]                  Gait Training                         [ ]           Modalities  [ ]                  Therapeutic Exercises           [ ]           Edema Management/Control  [ ]                  Therapeutic Activities            [ ]           Patient and Family Training/Education  [ ]   Other (comment):  Discharge Recommendations: Home Health  Further Equipment Recommendations for Discharge: none       SUBJECTIVE:   Patient stated ???I am so happy to get up and walk, it feels so good.???      OBJECTIVE DATA SUMMARY:   Critical Behavior:  Neurologic State: Alert  Orientation Level: Oriented X4        Range of Motion:           RLE AROM  R Knee Flexion: 90  R Knee Extension: 0              Functional Mobility Training:  Bed Mobility:  Rolling: Modified independent  Supine to Sit: Modified independent              Transfers:  Sit to Stand: Modified independent;Adaptive equipment;Additional time  Stand to Sit: Modified independent;Adaptive equipment;Additional time                             Balance:  Sitting: Intact;Without support  Standing: Intact;With support  Standing - Static: Good  Standing - Dynamic : Good  Ambulation/Gait Training:  Distance (ft): 150 Feet (ft)  Assistive Device: Gait belt;Walker, rolling  Ambulation - Level of Assistance: Modified independent;Adaptive equipment;Additional time        Gait Abnormalities: Antalgic (minimally)        Base of Support: Shift to left  Stance: Right decreased  Speed/Cadence: Slow  Step Length: Right shortened;Left shortened                               Stairs:           Therapeutic Exercises:     EXERCISE   Sets   Reps   Active Active Assist   Passive Self ROM   Comments   Ankle Pumps     [X]                                            [ ]                                             [ ]                                            [ ]                                                Quad Sets     [X]                                            [ ]                                            [ ]                                            [ ]   Hamstring Sets                                                                                                                                                                                     Short Arc Quads                                                                                                                                                                                     Knee Extension Stretch                                                                                                                                                                                             Heel Slides                                                                                                                                        Long Arc Quads     [ ]                                            [ ]                                            [ ]                                            [ ]                                                Knee Flexion Stretch     [X]                                            [ ]                                            [ ]                                            [ ]                                                Straight Leg Raises     [ ]                                            [ ]                                            [ ]                                            [ ]                                                      Pain:  Pain Scale 1: Numeric (0 - 10)  Pain Intensity 1: 2           Pain Intervention(s) 1: Medication (see MAR)  Activity Tolerance:  Very good, with stable VS  Please refer to the flowsheet for vital signs taken during this treatment.  After treatment:   [X]  Patient left in no apparent distress sitting up in chair  [ ]  Patient left in no apparent distress in bed  [X]  Call bell left within reach  [X]  Nursing notified  [ ]  Caregiver present  [ ]  Bed alarm activated      COMMUNICATION/COLLABORATION:   The patient???s plan of care was discussed with: Registered Nurse     Hetty Elyiffany E Heller, PT   Time Calculation: 30 mins

## 2015-12-22 NOTE — Progress Notes (Signed)
Hospitalist Progress Note            Daily Progress Note: 12/22/2015    I have seen and examined the patient.  Case discussed with NP, please see her more detailed note   I agree with the overall treatment plan as documented.  Acute PE/DVT on Xarelto APpreciate Hematology  Acute blood loss anemia  Chest pain likely sec to PE.  Now feels much better  Will sign off        Teofilo PodPunit Rachna Schonberger, MD      Hospitalist, Internal Medicine            Blackberry number:  [phone]

## 2015-12-22 NOTE — Progress Notes (Signed)
Patient and daughter instructed in all discharge information,  medications, wound management, anticoagulant therapy with xarelto and home care is arranged. Beth verbalized understanding of all information. Given printed copies of all. Rxs were filled by [facility]-bedside. Discharged via wc with all belongings.

## 2015-12-22 NOTE — Telephone Encounter (Signed)
V/m left for patient notifying I received her message regarding her being admitted to the hospital & she has been dx with a dvt & pulmonary embolism. I advised PCP is out of the office on vacation & will not be back until the end of June. I expressed she is welcome to return my call.

## 2015-12-24 ENCOUNTER — Encounter: Attending: Neurology | Primary: Internal Medicine

## 2015-12-25 NOTE — Telephone Encounter (Signed)
Pt called stating she was released from the hospital on Monday and she is in a lot of pain in her right leg and calf and a lot of swelling. She stated she is on Xarelto but she isn't sure if experiencing this pain is normal or if she should go back to the hospital. Call back number [phone]

## 2015-12-25 NOTE — Telephone Encounter (Signed)
12/25/15- Returned patients phone call she called to report multiple symptoms she is experiencing since leaving the hospital. She also had questions regarding her blood clots- phone call transferred to Lonell GrandchildSarah Grant.NP.

## 2015-12-26 ENCOUNTER — Inpatient Hospital Stay: Admit: 2015-12-26 | Payer: BLUE CROSS/BLUE SHIELD | Attending: Hematology & Oncology | Primary: Internal Medicine

## 2015-12-26 ENCOUNTER — Encounter

## 2015-12-26 DIAGNOSIS — I82431 Acute embolism and thrombosis of right popliteal vein: Secondary | ICD-10-CM

## 2015-12-26 NOTE — Progress Notes (Signed)
Bilateral LE venous duplex completed. Prelim results given to Dr. Georges LynchSadami at [phone]. Patient sent home per Dr. Georges LynchSadami.

## 2015-12-29 NOTE — Procedures (Signed)
[facility]  *** FINAL REPORT ***    Name: [name], Lindsay  MRN: [id number]    Outpatient  DOB: 03 Oct 1955  HIS Order #: [id number]  TRAKnet Visit #: [id number]  Date: 26 Dec 2015    TYPE OF TEST: Peripheral Venous Testing    REASON FOR TEST  Pain in limb, DVT    Right Leg:-  Deep venous thrombosis:           Yes  Proximal extent of thrombus:      Popliteal Below The Knee  Superficial venous thrombosis:    No  Deep venous insufficiency:        No  Superficial venous insufficiency: No    Left Leg:-  Deep venous thrombosis:           No  Superficial venous thrombosis:    No  Deep venous insufficiency:        No  Superficial venous insufficiency: No      INTERPRETATION/FINDINGS  PROCEDURE:  BILATERAL LE VENOUS DUPLEX.  Evaluation of lower extremity veins with ultrasound (B-mode imaging,  pulsed Doppler, color Doppler).  Includes the common femoral, deep  femoral, femoral, popliteal, posterior tibial, peroneal, and great  saphenous veins.    FINDINGS:   GRay scale imaging of the right popliteal vein s below the   knee and one of the paired posterior tibial veins demonstrated non-  compressability  consitent with acute deep vein thrombosis, as seen on   previous study on 12/19/15.Gray scale and color flow duplex images of  the remaining veins in both lower extremities demonstrate normal  compressibility, spontaneous and augmented flow profiles, and absence  of filling defects throughout the deep and superficial veins in both  lower extremities.    CONCLUSION:  Bilateral lower extremity venous duplex positive for deep   venous thrombosis and negative for thrombophlebitis.    ADDITIONAL COMMENTS  Preliminary results called in to Dt. Sadami.    I have personally reviewed the data relevant to the interpretation of  this  study.    TECHNOLOGIST: Zacarias PontesJasmine Smith, RVT  Signed: 12/26/2015 04:25 PM    PHYSICIAN: Vernia BuffMarc T. Sheliah HatchWarner, MD  Signed: 12/29/2015 07:56 AM

## 2015-12-29 NOTE — Procedures (Signed)
[facility]  *** FINAL REPORT ***    Name: Lindsay [name], Lindsay [name]  MRN: [id number]    Outpatient  DOB: 03 Oct 1955  HIS Order #: [id number]  TRAKnet Visit #: [id number]  Date: 26 Dec 2015    TYPE OF TEST: Peripheral Venous Testing    REASON FOR TEST  Pain in limb, DVT    Right Leg:-  Deep venous thrombosis:           Yes  Proximal extent of thrombus:      Popliteal Below The Knee  Superficial venous thrombosis:    No  Deep venous insufficiency:        No  Superficial venous insufficiency: No    Left Leg:-  Deep venous thrombosis:           No  Superficial venous thrombosis:    No  Deep venous insufficiency:        No  Superficial venous insufficiency: No      INTERPRETATION/FINDINGS  PROCEDURE:  BILATERAL LE VENOUS DUPLEX.  Evaluation of lower extremity veins with ultrasound (B-mode imaging,  pulsed Doppler, color Doppler).  Includes the common femoral, deep  femoral, femoral, popliteal, posterior tibial, peroneal, and great  saphenous veins.    FINDINGS:   GRay scale imaging of the right popliteal vein s below the   knee and one of the paired posterior tibial veins demonstrated non-  compressability  consitent with acute deep vein thrombosis, as seen on   previous study on 12/19/15.Gray scale and color flow duplex images of  the remaining veins in both lower extremities demonstrate normal  compressibility, spontaneous and augmented flow profiles, and absence  of filling defects throughout the deep and superficial veins in both  lower extremities.    CONCLUSION:  Bilateral lower extremity venous duplex positive for deep   venous thrombosis and negative for thrombophlebitis.    ADDITIONAL COMMENTS  Preliminary results called in to Dt. Sadami.    I have personally reviewed the data relevant to the interpretation of  this  study.    TECHNOLOGIST: Jasmine Smith, RVT  Signed: 12/26/2015 04:25 PM    PHYSICIAN: Grizelda Piscopo T. Berlin Mokry, MD  Signed: 12/29/2015 07:56 AM

## 2016-01-07 NOTE — Telephone Encounter (Signed)
Discussed her anemia prior to surgery hgb 9.1 post op down to 6.1 got 2 units prbc  Advised needs gi eval  Did see dr sandami-doppler done looked improved from prior study he has done labs   She will make gi appt

## 2016-01-14 ENCOUNTER — Emergency Department: Admit: 2016-01-14 | Payer: BLUE CROSS/BLUE SHIELD | Primary: Internal Medicine

## 2016-01-14 ENCOUNTER — Encounter: Attending: Registered Nurse | Primary: Internal Medicine

## 2016-01-14 ENCOUNTER — Inpatient Hospital Stay
Admit: 2016-01-14 | Discharge: 2016-01-14 | Disposition: A | Discharge status: Home / Self Care | Payer: BLUE CROSS/BLUE SHIELD | Attending: Emergency Medicine

## 2016-01-14 DIAGNOSIS — M79604 Pain in right leg: Secondary | ICD-10-CM

## 2016-01-14 NOTE — ED Provider Notes (Signed)
HPI Comments: Lindsay [name] is a 60 y.o. female with PMHx significant for PE, DVT, Arthritis, recent knee replacement surgery who presents ambulatory to [facility] ED with concern for DVT of the right lower extremity x today. Pt is c/o right leg pain and swelling x this morning. Pt does report a recent knee replacement and has had 2 diagnosed DVT's and 1 PE since her surgery. Pt has not been diagnosed with a DVT today but reports her symptoms are similar to the symptoms prior to her past DVT diagnosis. Pt is on Xarelto 15 mg 2x per day since her surgery but reports she has an appointment with her hematologist to lower her dose to once a day 15 mg. Pt specifically denies any fever, HA, dizziness, lightheadedness, abdominal pain, nausea, vomiting, diarrhea, CP, focal calf pain.    PCP: Clarene Critchley, MD  Orthopedics: Dr. Linton Rump  Hematology: dr. Jacques Earthly.    Social Hx: - tobacco (-), +EtOH (14 cans of beer), - illicit drugs (-).    There are no other complaints, changes or physical findings at this time.    The history is provided by the patient. No language interpreter was used.        Past Medical History:   Diagnosis Date   ??? Abnormal brain scan    ??? Arthritis    ??? Chronic pain    ??? Depression    ??? Falls frequently    ??? Fracture of left ankle    ??? Headache 2011   ??? Ill-defined condition 09/15/2015    FX R HAND   ??? Other and unspecified hyperlipidemia 12/27/2012   ??? PE (pulmonary embolism) 12/2012   ??? Torn rotator cuff     right       Past Surgical History:   Procedure Laterality Date   ??? BREAST SURGERY PROCEDURE UNLISTED  2002    AUGMENTATION   ??? HX APPENDECTOMY     ??? HX CESAREAN SECTION      x4   ??? HX GI      COLONOSCOPY   ??? 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 HX ORTHOPAEDIC  08/2004    rotater cuff r. shoulder   ??? HX ORTHOPAEDIC      left ankle fracture treated with air cast   ??? HX ORTHOPAEDIC  6/13    left shoulder fxt and dislocation   ??? HX OTHER SURGICAL  2010    left foot torn tendon   ??? HX TUBAL LIGATION            Family History:   Problem Relation Age of Onset   ??? Heart Disease Mother    ??? Heart Disease Father    ??? Cancer Father      thyroid cancer   ??? Arthritis-rheumatoid Sister    ??? No Known Problems Son    ??? No Known Problems Daughter    ??? No Known Problems Daughter    ??? No Known Problems Daughter    ??? Anesth Problems Neg Hx        Social History     Social History   ??? Marital status: MARRIED     Spouse name: N/A   ??? Number of children: N/A   ??? Years of education: N/A     Occupational History   ??? Not on file.     Social History Main Topics   ??? Smoking status: Never Smoker   ??? Smokeless tobacco: Never Used   ??? Alcohol  use 8.4 oz/week     14 Cans of beer per week      Comment: 2 MICHELOB ULTRA PER DAY   ??? Drug use: No   ??? Sexual activity: Yes     Partners: Male     Other Topics Concern   ??? Not on file     Social History Narrative         ALLERGIES: Review of patient's allergies indicates no known allergies.    Review of Systems   Constitutional: Negative.  Negative for fever.   Cardiovascular: Positive for leg swelling (right). Negative for chest pain.   Gastrointestinal: Negative for abdominal pain, diarrhea, nausea and vomiting.   Musculoskeletal: Positive for myalgias (right leg).        Negative for calf pain   Neurological: Negative for dizziness, light-headedness and headaches.   All other systems reviewed and are negative.      Vitals:    01/14/16 1544   BP: 114/76   Pulse: 71   Resp: 16   Temp: 97.9 ??F (36.6 ??C)   SpO2: 100%   Weight: 72.1 kg (158 lb 15.2 oz)   Height:  (1.6 m)            Physical Exam   Constitutional: She is oriented to person, place, and time. She appears well-developed and well-nourished. No distress.   Pleasant 60 yo Caucasian female   HENT:   Head: Normocephalic and atraumatic.   Eyes: Conjunctivae are normal. Right eye exhibits no discharge. Left eye exhibits no discharge.   Neck: Normal range of motion. Neck supple.    Cardiovascular: Normal rate, regular rhythm, normal heart sounds and intact distal pulses.    No murmur heard.  Pulmonary/Chest: Effort normal and breath sounds normal. No respiratory distress. She has no wheezes.   Musculoskeletal:   R LEG: Well healing recent surgical scar from TKR without signs of infection. Slight TTP to the medial knee and medial ankle without swelling, ecchymosis, erythema or deformity. Distal NV intact. Cap refill < 2 sec. Ambulatory without difficulty or limp.    Neurological: She is alert and oriented to person, place, and time.   Skin: Skin is warm and dry. She is not diaphoretic.   Psychiatric: She has a normal mood and affect. Her behavior is normal.   Nursing note and vitals reviewed.       MDM  Number of Diagnoses or Management Options  Diagnosis management comments: DDx: DVT, Thrombophlebitis, Cellulitis, Leg pain, Surgical complications.       Amount and/or Complexity of Data Reviewed  Tests in the radiology section of CPT??: ordered and reviewed  Review and summarize past medical records: yes    Patient Progress  Patient progress: stable    ED Course       Procedures    4:55 PM  Called [place] and left VM notifying the tech that patient is undressed and ready for [place].     PROGRESS NOTE:  6:25 PM  Explained available results with the pt and provided her with a copy of her results.  Written by Eather Colas, ED Scribe, as dictated by Ara Kussmaul .    IMAGING RESULTS:  DUPLEX LOWER EXT VENOUS RIGHT   Final Result   Study Result    ?? EXAM:  DUPLEX LOWER EXT VENOUS RIGHT  ??  INDICATION:  Leg swelling, pain, DVT suspected  ??  COMPARISON: None.  ??  FINDINGS: Duplex Doppler sonography of the right lower extremity  was performed  from the groin to the calf. The right common femoral, femoral and popliteal  veins are compressible with normal color-flow and wave forms and response to  physiologic maneuvers including Valsalva and augmentation. There is a 2.4 x 0.9   cm Baker's cyst posterior medial to the knee.  ??  IMPRESSION  IMPRESSION: No deep venous thrombosis identified.        IMPRESSION:  1. Right leg pain        PLAN:  1.   Current Discharge Medication List      CONTINUE these medications which have NOT CHANGED    Details   rivaroxaban (XARELTO) 10 mg tablet Take  by mouth daily.      oxyCODONE IR (ROXICODONE) 5 mg immediate release tablet Take 1-2 Tabs by mouth every four (4) hours as needed for Pain. Max Daily Amount: 60 mg.  Qty: 80 Tab, Refills: 0      celecoxib (CELEBREX) 200 mg capsule Take 1 Cap by mouth daily for 30 days.  Qty: 30 Cap, Refills: 0      acetaminophen (TYLENOL) 325 mg tablet Take 650 mg by mouth every four (4) hours as needed for Pain.      rOPINIRole (REQUIP) 1 mg tablet Take 1.5 tab 1-3 hours before bedtime  Qty: 45 Tab, Refills: 2      ergocalciferol (ERGOCALCIFEROL) 50,000 unit capsule Take 1 Cap by mouth every seven (7) days.  Qty: 12 Cap, Refills: 1    Associated Diagnoses: Vitamin deficiency      escitalopram oxalate (LEXAPRO) 20 mg tablet Take 1 Tab by mouth daily.  Qty: 30 Tab, Refills: 5      omeprazole (PRILOSEC) 20 mg capsule Take 20 mg by mouth nightly.      diazePAM (VALIUM) 5 mg tablet Take 5 mg by mouth every eight (8) hours as needed for Anxiety.           2.   Follow-up Information     Follow up With Details Comments Contact Info    Clarene CritchleyFrancesca L Glynn, MD  As needed (or your hematologist or orthopedist) [address]  [address]  [place] [id number]  [phone]          3. Continue treatment as previously scheduled.    Return to ED if worse     DISCHARGE NOTE  6:28 PM  The patient has been re-evaluated and is ready for discharge. Reviewed available results with patient. Counseled pt on diagnosis and care plan. Pt has expressed understanding, and all questions have been answered. Pt agrees with plan and agrees to F/U as recommended, or return to the ED if  their sxs worsen. Discharge instructions have been provided and explained to the pt, along with reasons to return to the ED.    This note is prepared by Eather Colasaniel Villarroel acting as Scribe for Fifth Third BancorpPA-C Ryllie Nieland     PA-C Courvoisier Hamblen  : The scribe's documentation has been prepared under my direction and personally reviewed by me in its entirety. I confirm that the note above accurately reflects all work, treatment, procedures, and medical decision making performed by me.

## 2016-02-02 MED ORDER — ROPINIROLE 1 MG TAB
1 mg | ORAL_TABLET | ORAL | 0 refills | Status: DC
Start: 2016-02-02 — End: 2016-03-05

## 2016-02-03 NOTE — Telephone Encounter (Signed)
Per [place] - She is scheduled for next available, 03/05/16. She has been unable to come due to having both knees replaced. Please advise. Thanks!     Ok. Requip refilled.

## 2016-02-03 NOTE — Telephone Encounter (Signed)
-----   Message from Elease Etienne sent at 02/03/2016 12:19 PM EDT -----  Regarding: Dr Ransom/telephone/rx  Pt (p) [phone] she was unable to keep her last appt due to knee replacement surgery and had developed blood clots behind her knee, she would like to know if she can be scheduled sometime  This month  , she feels she may have had a mini stroke after having difficulty swallowing  one night around  3 weeks  ago  and had trouble speaking during that time,  And since then she feels she has trouble getting her words out , she said she  would feel comfortable seeing Dr Alison Murray and  discussing it with her.     Pt is also scheduled to see her PCP on August 23rd at 11:00am.

## 2016-02-25 ENCOUNTER — Encounter: Attending: Internal Medicine | Primary: Internal Medicine

## 2016-03-05 ENCOUNTER — Ambulatory Visit
Admit: 2016-03-05 | Discharge: 2016-03-05 | Payer: PRIVATE HEALTH INSURANCE | Attending: Neurology | Primary: Internal Medicine

## 2016-03-05 DIAGNOSIS — G2581 Restless legs syndrome: Secondary | ICD-10-CM

## 2016-03-05 MED ORDER — GABAPENTIN ENACARBIL ER 300 MG TABLET,EXTENDED RELEASE
300 mg | ORAL_TABLET | Freq: Every day | ORAL | 0 refills | Status: DC
Start: 2016-03-05 — End: 2016-03-15

## 2016-03-05 MED ORDER — ROPINIROLE 1 MG TAB
1 mg | ORAL_TABLET | ORAL | 5 refills | Status: DC
Start: 2016-03-05 — End: 2016-07-26

## 2016-03-05 MED ORDER — GABAPENTIN ENACARBIL ER 300 MG TABLET,EXTENDED RELEASE
300 mg | ORAL_TABLET | Freq: Every day | ORAL | 5 refills | Status: DC
Start: 2016-03-05 — End: 2016-04-23

## 2016-03-05 NOTE — Patient Instructions (Signed)
PRESCRIPTION REFILL POLICY    [facility]   Statement to Patients  October 03, 2012      In an effort to ensure the large volume of patient prescription refills is processed in the most efficient and expeditious manner, we are asking our patients to assist us by calling your Pharmacy for all prescription refills, this will include also your  Mail Order Pharmacy. The pharmacy will contact our office electronically to continue the refill process.    Please do not wait until the last minute to call your pharmacy. We need at least 48 hours (2days) to fill prescriptions. We also encourage you to call your pharmacy before going to pick up your prescription to make sure it is ready.     With regard to controlled substance prescription refill requests (narcotic refills) that need to be picked up at our office, we ask your cooperation by providing us with at least 72 hours (3days) notice that you will need a refill.    We will not refill narcotic prescription refill requests after 4:00pm on any weekday, Monday through Thursday, or after 2:00pm on Fridays, or on the weekends.      We encourage everyone to explore another way of getting your prescription refill request processed using MyChart, our patient web portal through our electronic medical record system. MyChart is an efficient and effective way to communicate your medication request directly to the office and  downloadable as an app on your smart phone . MyChart also features a review functionality that allows you to view your medication list as well as leave messages for your physician. Are you ready to get connected? If so please review the attatched instructions or speak to any of our staff to get you set up right away!    Thank you so much for your cooperation. Should you have any questions please contact our Practice Administrator.    The Physicians and Staff,  [facility]      Thank you for choosing [facility] for your     care.  You may receive a survey about your visit.  We appreciate you taking time     to complete this survey as we use your feedback to improve our services.  We     realize we are not perfect, but we strive to provide excellent care.

## 2016-03-05 NOTE — Progress Notes (Signed)
Patient here for C/O episode difficulty swallowing and aphasia.

## 2016-03-05 NOTE — Progress Notes (Signed)
Neurology Progress Note    HISTORY PROVIDED BY: patient and daughter    Chief Complaint:   Chief Complaint   Patient presents with   ??? Leg Pain     follow up   ??? Aphasia      Subjective:   Pt is a 60 y.o. right handed female last seen in clinic on 06/23/15 in f/u for h/o radiographic changes c/w MS, but no clinical symptoms, last MRI brain 10/22/14 was stable. Additionally, pt c/o frequent falls with injuries without obvious neurological etiology, but could be medication effects with use of Valium 15mg  bid, improved after PT at [facility].  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  Finally, pt with concerns about personality changes describing lack of motivation, not keeping up with house work/office work/or personal care, memory loss, fatigue, and new panic attacks, suspected due to mood disorder, ADHD, and stressors.  Exam was unchanged with mild right ptosis, MMSE score 03/2015 was 30/30.  Encouraged pt to follow psychiatrist's recommendation to start Wellbutrin to see if cognitive/behavior complaints improve.  Continued Requip 1 mg nightly for RLS.      She returns for delayed f/u.  Has undergone bilateral knee replacements since last visit, 10/29/15 and 12/17/15. She had a DVT in right LE after second surgery and subsequent PE. Pt has h/o PE in 2014. She was on Xarelto, but she stopped it on her own after two months. She has been trying to see a hematologist, but something has happened with each appointment. She states multiple times that the best thing would be for her to have a blood clot and die, the world would not miss her.  She has not seen her psychiatrist since her first knee surgery, Dr. Laretta Alstrom at [place].  Her RLS worsened since surgery, increased her Requip to 2mg   qhs after second surgery. She reports feeling the need to walk constantly, becomes anxious if someone is standing in the room while she is sitting or if she lying on bed and PT is standing over her.  At the end July, was sitting at home watching TV, had trouble swallowing her own sylvia, told her family "I'm having trouble swallowing", she tried to say something to them, but interchanged the beginnings of two words.  They felt it was due to fatigue and suggested she go to bed.     Past Medical History:   Diagnosis Date   ??? Abnormal brain scan    ??? Arthritis    ??? Chronic pain    ??? Depression    ??? Falls frequently    ??? Fracture of left ankle    ??? Headache 2011   ??? Ill-defined condition 09/15/2015    FX R HAND   ??? Other and unspecified hyperlipidemia 12/27/2012   ??? PE (pulmonary embolism) 12/2012   ??? Torn rotator cuff     right      Past Surgical History:   Procedure Laterality Date   ??? BREAST SURGERY PROCEDURE UNLISTED  2002    AUGMENTATION   ??? HX APPENDECTOMY     ??? HX CESAREAN SECTION      x4   ??? HX GI      COLONOSCOPY   ??? 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 HX ORTHOPAEDIC  08/2004    rotater  cuff r. shoulder   ??? HX ORTHOPAEDIC      left ankle fracture treated with air cast   ??? HX ORTHOPAEDIC  6/13    left shoulder fxt and dislocation   ??? HX OTHER SURGICAL  2010    left foot torn tendon   ??? HX TUBAL LIGATION        Social History     Social History   ??? Marital status: MARRIED     Spouse name: N/A   ??? Number of children: N/A   ??? Years of education: N/A     Occupational History   ??? Not on file.     Social History Main Topics   ??? Smoking status: Never Smoker   ??? Smokeless tobacco: Never Used   ??? Alcohol use 8.4 oz/week     14 Cans of beer per week      Comment: 2 MICHELOB ULTRA PER DAY   ??? Drug use: No   ??? Sexual activity: Yes     Partners: Male     Other Topics Concern   ??? Not on file     Social History Narrative     Family History   Problem Relation Age of Onset   ??? Heart Disease Mother    ??? Heart Disease Father     ??? Cancer Father      thyroid cancer   ??? Arthritis-rheumatoid Sister    ??? No Known Problems Son    ??? No Known Problems Daughter    ??? No Known Problems Daughter    ??? No Known Problems Daughter    ??? Anesth Problems Neg Hx           Objective:   ROS:  Per HPI-  Otherwise 10 point ROS was negative    No Known Allergies    Meds:  Outpatient Medications Prior to Visit   Medication Sig Dispense Refill   ??? rOPINIRole (REQUIP) 1 mg tablet TAKE 1 AND 1/2 TABLETS BY MOUTH EVERY NIGHT 1 TO 3 HOURS BEFORE BEDTIME (Patient taking differently: patient taking two tablets) 45 Tab 0   ??? acetaminophen (TYLENOL) 325 mg tablet Take 650 mg by mouth every four (4) hours as needed for Pain.     ??? diazePAM (VALIUM) 5 mg tablet Take 5 mg by mouth every eight (8) hours as needed for Anxiety.     ??? ergocalciferol (ERGOCALCIFEROL) 50,000 unit capsule Take 1 Cap by mouth every seven (7) days. 12 Cap 1   ??? escitalopram oxalate (LEXAPRO) 20 mg tablet Take 1 Tab by mouth daily. (Patient taking differently: Take 20 mg by mouth nightly.) 30 Tab 5   ??? omeprazole (PRILOSEC) 20 mg capsule Take 20 mg by mouth nightly.     ??? rivaroxaban (XARELTO) 10 mg tablet Take  by mouth daily.     ??? oxyCODONE IR (ROXICODONE) 5 mg immediate release tablet Take 1-2 Tabs by mouth every four (4) hours as needed for Pain. Max Daily Amount: 60 mg. 80 Tab 0     No facility-administered medications prior to visit.        Imaging:  MRI Results (most recent):    Results from Hospital Encounter encounter on 03/31/12   MRI SHOULDER LT WO CONT   Narrative **Final Report**      ICD Codes / Adm.Diagnosis: 727.61  786.50 / Complete rupture of rotator cu    Examination:  MR SHOULDER WO CON LT  - [id number] - Mar 31 2012  3:40PM  Accession No:  [id number]  Reason:  pain      REPORT:  EXAM:  MR SHOULDER WO CON LT    INDICATION: Fell on June 18 with left shoulder pain.    COMPARISON: None    TECHNIQUE: Axial proton density fat-saturated; oblique coronal T1, T2    fat-saturated, and proton density fat-saturated; and oblique sagittal T2   fat-saturated MRI of the left shoulder.    CONTRAST: None.    FINDINGS: A.C. joint: Normal for age. Anterior acromion process type: 2.   There is a small subacromial spur.    Bone marrow: There is a transverse fracture of the surgical neck of the   humerus with surrounding bone marrow edema the structures toward the greater   tuberosity. There is minimal anterior angulation at the fracture site. There   is no continuation to the articular surface of the humeral head.    Joint fluid: No joint effusion.    Rotator cuff tendons: There is high-grade partial-thickness tearing of the   anterior aspect of the supraspinatus tendon on series 6 image 13. Only   several fibers remain intact and there is no significant retraction. There   is a small partial-thickness articular sided tear in the insertion of the   infraspinatus measuring 7 mm in the AP direction on series 7 image 15 x 5 mm   transverse on series 5 image 7. Mild tendinosis is present in the cranial   fibers of the subscapularis. The remainder of the cuff is intact.    Biceps tendon: Intact and located within the bicipital groove.    Muscles: Mild edema is present along the inferior aspect of the muscle belly   of the supraspinatus on series 7 image 1.    Glenoid labrum: Diffuse tearing is present throughout the labrum, which may   be a least partially degenerative in nature.    Joint capsule: The middle glenohumeral ligament is mildly enlarged with   increased signal on series 3 image 9 may related to partial tearing.   Synovitis is seen both in the rotator interval and in the axillary pouch.    Glenohumeral articular cartilage: Intact. No focal osteochondral lesion.    Soft tissue mass: None.       IMPRESSION:  1. Subacute transverse fracture of the surgical neck of the humerus with   mild anterior angulation.  2. High-grade partial tearing of the anterior supraspinatus tendon. Mild    edema seen along the inferior aspect of the supraspinatus more medially.  3. Partial-thickness articular sided insertional tear of the infraspinatus   tendon.  4. Diffuse degenerative changes throughout the labrum.  5. Enlargement and high signal within the middle glenohumeral ligament   likely related to partial tearing.  6. Synovitis in the rotator interval and axillary pouch.           Signing/Reading Doctor: Jonelle Sports ([id number])    Approved: Jonelle Sports ([id number])  Apr 03 2012  8:08AM                                     CT Results (most recent):    Results from Hospital Encounter encounter on 12/17/15   CTA CHEST W OR W WO CONT   Narrative EXAM:  CTA CHEST W OR W WO CONT    INDICATION:   Known DVT, chest pain    COMPARISON: 02/05/2014.  TECHNIQUE:   Precontrast scout images were obtained to localize the volume for acquisition.  Multislice helical CT arteriography was performed from the diaphragm to the  thoracic inlet during uneventful rapid bolus of 70 cc Isovue-370. Lung and soft  tissue windows were generated.  Coronal and sagittal images were generated and  3D post processing consisting of coronal maximum intensity images was performed.   CT dose reduction was achieved through use of a standardized protocol tailored  for this examination and automatic exposure control for dose modulation.      FINDINGS:  CHEST:  THYROID: No nodule.  MEDIASTINUM: No mass or lymphadenopathy.  HILA: No mass or lymphadenopathy.  THORACIC AORTA: No dissection or aneurysm.  MAIN PULMONARY ARTERY: Pulmonary emboli are noted in left lower lobe branches.  TRACHEA/BRONCHI: Patent.  ESOPHAGUS: No wall thickening or dilatation.  HEART: Normal in size.  PLEURA: No effusion or pneumothorax.  LUNGS: No nodule, mass, or airspace disease.  INCIDENTALLY IMAGED UPPER ABDOMEN: No focal abnormality.  BONES: No destructive bone lesion.           Impression IMPRESSION: Left lower lobe pulmonary emboli. No acute process in the lung  fields.     The findings were called to the patient's nurse on 01/20/2016 at 1:50 PM by  myself.  [phone]             Reviewed records in [facility] and media tab today    Lab Review   Results for orders placed or performed during the hospital encounter of 12/17/15   METABOLIC PANEL, BASIC   Result Value Ref Range    Sodium 136 136 - 145 mmol/L    Potassium 3.5 3.5 - 5.1 mmol/L    Chloride 103 97 - 108 mmol/L    CO2 24 21 - 32 mmol/L    Anion gap 9 5 - 15 mmol/L    Glucose 124 (H) 65 - 100 mg/dL    BUN 9 6 - 20 MG/DL    Creatinine 9.810.59 1.910.55 - 1.02 MG/DL    BUN/Creatinine ratio 15 12 - 20      GFR est AA >60 >60 ml/min/1.5673m2    GFR est non-AA >60 >60 ml/min/1.6673m2    Calcium 7.7 (L) 8.5 - 10.1 MG/DL   HEMOGLOBIN   Result Value Ref Range    HGB 6.9 (L) 11.5 - 16.0 g/dL   HEMOGLOBIN   Result Value Ref Range    HGB 6.7 (L) 11.5 - 16.0 g/dL   HEMOGLOBIN   Result Value Ref Range    HGB 7.6 (L) 11.5 - 16.0 g/dL   HEMOGLOBIN   Result Value Ref Range    HGB 6.1 (L) 11.5 - 16.0 g/dL   OCCULT BLOOD, STOOL   Result Value Ref Range    Occult blood, stool NEGATIVE  NEG     HEMOGLOBIN   Result Value Ref Range    HGB 8.9 (L) 11.5 - 16.0 g/dL   GLUCOSE, POC   Result Value Ref Range    Glucose (POC) 111 (H) 65 - 100 mg/dL    Performed by Caleb PoppSusan Hanks    EKG, 12 LEAD, INITIAL   Result Value Ref Range    Ventricular Rate 72 BPM    Atrial Rate 72 BPM    P-R Interval 138 ms    QRS Duration 76 ms    Q-T Interval 422 ms    QTC Calculation (Bezet) 462 ms    Calculated P Axis 20 degrees    Calculated  R Axis 26 degrees    Calculated T Axis 38 degrees    Diagnosis       Normal sinus rhythm  Normal ECG  When compared with ECG of 21-Dec-2015 10:42,  No significant change was found  Confirmed by Dahlia Client, M.D., Christine ([id number]) on 12/22/2015 12:18:31 PM     TYPE & SCREEN   Result Value Ref Range    Crossmatch Expiration 12/24/2015     ABO/Rh(D) AB POSITIVE     Antibody screen NEG     Unit number [id number]     Blood component type RC LR AS1      Unit division 00     Status of unit TRANSFUSED     Crossmatch result Compatible     Unit number [id number]     Blood component type RC LR AS1     Unit division 00     Status of unit TRANSFUSED     Crossmatch result Compatible         Exam:  Visit Vitals   ??? BP 110/60   ??? Pulse 76   ??? Temp 99 ??F (37.2 ??C)   ??? Resp 18   ??? SpO2 97%     General:  Alert, cooperative, no distress.   Head:  Normocephalic, without obvious abnormality, atraumatic.   Respiratory:  Heart:   Non labored breathing  Regular rate and rhythm, no murmurs       Extremities: Warm, no cyanosis or edema.   Pulses: 2+ radial pulses.       Neurologic:  MS: Alert and oriented x 4, speech intact. Language - inact. Attention and fund of knowledge appropriate.  Recent and remote memory intact.  Cranial Nerves:  II: visual fields Full to confrontation   II: pupils Equal, round, reactive to light   II: optic disc    III,VII: ptosis Mild right ptosis - stable   III,IV,VI: extraocular muscles  EOMI, no nystagmus or diplopia   V: facial light touch sensation     VII: facial muscle function   symmetric   VIII: hearing intact   IX: soft palate elevation  normal   XI: trapezius strength     XI: sternocleidomastoid strength    XII: tongue  Midline     Motor: normal bulk and tone, no tremor              Strength: 5/5 throughout, no PD    Coordination: FTN and RAM intact  Gait: normal gait  Reflexes: 2+ symmetric             Assessment/Plan   Pt is a 60 y.o. right handed female with h/o radiographic changes c/w MS, but no clinical symptoms, last MRI brain 10/22/14 was stable, and RLS, with an ill-defined irritating sensation in her legs with urge to move her legs beginning around 6PM and keeping her awake, was improved on Requip 1mg  qhs, now worsened and not responding to 2mg  qhs.  Ongoing severe depression and anxiety. Exam is unchanged with mild right ptosis. Continue Requip 2mg  qhs, add Horizant 300mg  qPM, side effects reviewed. Pt urged to  call psychiatrist for an earlier appt or go to the ED if feeling suicidal.  F/u in six weeks, instructed to call in the interim if needed.       ICD-10-CM ICD-9-CM    1. RLS (restless legs syndrome) G25.81 333.94        Signed:  Jodi Mourning, MD  03/05/2016

## 2016-03-09 NOTE — Progress Notes (Signed)
Office notes faxed to Dr. Theodis ShoveElliott Spanier at [phone].

## 2016-03-15 MED ORDER — GABAPENTIN ENACARBIL ER 300 MG TABLET,EXTENDED RELEASE
300 mg | ORAL_TABLET | Freq: Every day | ORAL | 0 refills | Status: DC
Start: 2016-03-15 — End: 2016-03-16

## 2016-03-15 NOTE — Telephone Encounter (Signed)
Pt calling in re to her refill of Horizant. Please call back she will be out tonight. Pt is wondering if it can't be refilled, if she can get samples.

## 2016-03-15 NOTE — Telephone Encounter (Signed)
Left message informing patient she can come by and pick up sample medications at the office.

## 2016-03-15 NOTE — Telephone Encounter (Signed)
Noted.

## 2016-03-15 NOTE — Telephone Encounter (Signed)
Horizant approved  Today    Case [id number]   Horizant Start Date:03/15/2016  - 03/15/2017 Faxed to local [facility].

## 2016-03-15 NOTE — Telephone Encounter (Signed)
Spoke with patient. She stated Horizant needs a PA. Will check with PA coordinator for an update since patient will run out of medication tonight.

## 2016-03-15 NOTE — Telephone Encounter (Signed)
Pt calling back, has an appt in town at noon, wants to know if she will be able to come by the office before she goes back home to pick up a sample of the medication.   Pt requesting a call back asap so she does not go back home without speaking to the nurse.

## 2016-03-15 NOTE — Telephone Encounter (Signed)
Re:  Horizant - sent PA request to [employer] via CMM w/ copy of office notes.  Key: [id number] ??? PA Case ID: [id number]

## 2016-03-15 NOTE — Telephone Encounter (Signed)
Ok to give samples

## 2016-03-16 ENCOUNTER — Ambulatory Visit
Admit: 2016-03-16 | Discharge: 2016-03-16 | Payer: PRIVATE HEALTH INSURANCE | Attending: Internal Medicine | Primary: Internal Medicine

## 2016-03-16 DIAGNOSIS — E785 Hyperlipidemia, unspecified: Secondary | ICD-10-CM

## 2016-03-16 NOTE — Telephone Encounter (Signed)
Noted.

## 2016-03-16 NOTE — Progress Notes (Signed)
Chief Complaint   Patient presents with   ??? Medication Evaluation     Patient is fasting     1. Have you been to the ER, urgent care clinic since your last visit?  Hospitalized since your last visit?Yes, blood clot after knee surgery, [facility]. Yes broken hand - [facility]    2. Have you seen or consulted any other health care providers outside of the [facility] since your last visit?  Include any pap smears or colon screening. No    [facility] Directive is not on file. Information added to AVS.

## 2016-03-16 NOTE — Progress Notes (Signed)
HISTORY OF PRESENT ILLNESS  Lindsay [name] is a 60 y.o. female.  HPI  [name] comes in with several concerns:  1. She's had two documented pulmonary emboli, one in 2014, the other in 2017, following orthopedic procedure.  She had been on Xarelto, most recently 10 mg a day.  She went to the ER in July with right leg pain.  Doppler was negative for DVT.  She decided to come off Xarelto on her own, partially because she wants to use Motrin for pain and knows she can't combine this with the blood thinner.  She also has not had follow up with hematology, apparently multiple appointments that she was late for or missed or went to the wrong location.  Strong conversation today that given two previous PEs she should be on blood thinners for life.  She is again complaining of right leg pain. Will check another doppler to be sure no acute DVT and she needs to be on at least 10 of Xarelto at home.  2. Restless leg syndrome.  Working with Dr. Scarlett Presto now and does feel better with Requip, trying to get approval for the long acting Gabapentin.  3. Mood disorder.  Sees Dr. Theodis Shove. She's on Lexapro and Valium, which he is monitoring.      She is fairly stable from an orthopedic perspective.  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 in [place].      Review of Systems   Constitutional: Negative for chills, fever and weight loss.   Respiratory: Negative for cough, shortness of breath and wheezing.    Cardiovascular: Negative for chest pain, palpitations, orthopnea, leg swelling and PND.   Gastrointestinal: Negative for abdominal pain, heartburn, nausea and vomiting.   Musculoskeletal: Positive for joint pain. Negative for falls and myalgias.   Neurological: Negative for dizziness and headaches.   Psychiatric/Behavioral: Positive for depression. Negative for suicidal ideas. The patient is nervous/anxious. The patient does not have insomnia.         Physical Exam   Constitutional: She is oriented to person, place, and time. She appears well-developed and well-nourished.   HENT:   Head: Normocephalic and atraumatic.   Neck: Normal range of motion. Neck supple. Carotid bruit is not present. No thyromegaly present.   Cardiovascular: Normal rate, regular rhythm, S1 normal, S2 normal, normal heart sounds and intact distal pulses.    No murmur heard.  Pulmonary/Chest: Effort normal and breath sounds normal. No respiratory distress. She has no wheezes. She has no rales.   Musculoskeletal: She exhibits no edema.   Legs are symmetric   Neurological: She is alert and oriented to person, place, and time.   Psychiatric: She has a normal mood and affect. Her behavior is normal.   Nursing note and vitals reviewed.      ASSESSMENT and PLAN  Diagnoses and all orders for this visit:    1. Dyslipidemia, goal LDL below 100  -     METABOLIC PANEL, COMPREHENSIVE  -     LIPID PANEL  -     TSH 3RD GENERATION    2. Pulmonary embolism, other (HCC)  -     REFERRAL TO HEMATOLOGY ONCOLOGY  -     CBC WITH AUTOMATED DIFF    3. Right leg pain-discussion as above needs to resume xarelto at least the 10 mg every day dose   -     DUPLEX LOWER EXT VENOUS  BILAT; Future    4. Anxiety-cotn with dr spanier    5. RLS (restless legs syndrome)

## 2016-03-17 LAB — METABOLIC PANEL, COMPREHENSIVE
A-G Ratio: 2.1 (ref 1.2–2.2)
ALT (SGPT): 11 IU/L (ref 0–32)
AST (SGOT): 20 IU/L (ref 0–40)
Albumin: 4.7 g/dL (ref 3.6–4.8)
Alk. phosphatase: 131 IU/L — ABNORMAL HIGH (ref 39–117)
BUN/Creatinine ratio: 24 (ref 12–28)
BUN: 14 mg/dL (ref 8–27)
Bilirubin, total: <0.2 mg/dL (ref 0.0–1.2)
CO2: 27 mmol/L (ref 18–29)
Calcium: 9.4 mg/dL (ref 8.7–10.3)
Chloride: 98 mmol/L (ref 96–106)
Creatinine: 0.59 mg/dL (ref 0.57–1.00)
GFR est AA: 114 mL/min/{1.73_m2} (ref >59)
GFR est non-AA: 99 mL/min/{1.73_m2} (ref >59)
GLOBULIN, TOTAL: 2.2 g/dL (ref 1.5–4.5)
Glucose: 82 mg/dL (ref 65–99)
Potassium: 4.3 mmol/L (ref 3.5–5.2)
Protein, total: 6.9 g/dL (ref 6.0–8.5)
Sodium: 140 mmol/L (ref 134–144)

## 2016-03-17 LAB — CBC WITH AUTOMATED DIFF
ABS. BASOPHILS: 0 10*3/uL (ref 0.0–0.2)
ABS. EOSINOPHILS: 0.2 10*3/uL (ref 0.0–0.4)
ABS. IMM. GRANS.: 0 10*3/uL (ref 0.0–0.1)
ABS. MONOCYTES: 0.4 10*3/uL (ref 0.1–0.9)
ABS. NEUTROPHILS: 2.6 10*3/uL (ref 1.4–7.0)
Abs Lymphocytes: 1.7 10*3/uL (ref 0.7–3.1)
BASOPHILS: 0 %
EOSINOPHILS: 4 %
HCT: 33.7 % — ABNORMAL LOW (ref 34.0–46.6)
HGB: 11 g/dL — ABNORMAL LOW (ref 11.1–15.9)
IMMATURE GRANULOCYTES: 0 %
Lymphocytes: 35 %
MCH: 24.9 pg — ABNORMAL LOW (ref 26.6–33.0)
MCHC: 32.6 g/dL (ref 31.5–35.7)
MCV: 76 fL — ABNORMAL LOW (ref 79–97)
MONOCYTES: 8 %
NEUTROPHILS: 53 %
PLATELET: 252 10*3/uL (ref 150–379)
RBC: 4.42 x10E6/uL (ref 3.77–5.28)
RDW: 16.2 % — ABNORMAL HIGH (ref 12.3–15.4)
WBC: 4.9 10*3/uL (ref 3.4–10.8)

## 2016-03-17 LAB — LIPID PANEL
Cholesterol, total: 256 mg/dL — ABNORMAL HIGH (ref 100–199)
HDL Cholesterol: 59 mg/dL (ref >39)
LDL, calculated: 153 mg/dL — ABNORMAL HIGH (ref 0–99)
Triglyceride: 220 mg/dL — ABNORMAL HIGH (ref 0–149)
VLDL, calculated: 44 mg/dL — ABNORMAL HIGH (ref 5–40)

## 2016-03-17 LAB — CVD REPORT

## 2016-03-17 LAB — TSH 3RD GENERATION: TSH: 2.1 u[IU]/mL (ref 0.450–4.500)

## 2016-03-17 NOTE — Progress Notes (Signed)
Notified by letter.

## 2016-03-19 ENCOUNTER — Inpatient Hospital Stay: Admit: 2016-03-19 | Payer: BLUE CROSS/BLUE SHIELD | Attending: Internal Medicine | Primary: Internal Medicine

## 2016-03-19 DIAGNOSIS — M79604 Pain in right leg: Secondary | ICD-10-CM

## 2016-03-19 NOTE — Telephone Encounter (Signed)
Yes she needs to start back on started back of xarelto and then taper down again

## 2016-03-19 NOTE — Telephone Encounter (Signed)
Incoming call from [facility] vascular lab to report finding of confirmed DVT in left leg in the popliteal vein at the knee (see preliminary report). Patient has a h/o PEs. She took herself off Xarelto so she could take Nsaids for pain. PCP discussed she will need to be on blood thinners for life due to her previous PEs. Can she resume her Xarelto 10 mg? Or what do you suggest?

## 2016-03-19 NOTE — Telephone Encounter (Signed)
Per Dr.Shah, patient notified to resume her 10 mg Xarelto daily & needs appt in 10 days for follow up. Patient scheduled appt with PCP on Sept 26th at 11:45 & voiced understanding of Xarelto instructions.

## 2016-03-22 NOTE — Procedures (Signed)
[facility]  *** FINAL REPORT ***    Name: Lindsay [name], Lindsay [name]  MRN: [id number]    Outpatient  DOB: 1955-09-11  HIS Order #: [id number]  TRAKnet Visit #: [id number]  Date: 19 Mar 2016    TYPE OF TEST: Peripheral Venous Testing    REASON FOR TEST  Pain in limb, Hx of DVT 06/17    Right Leg:-  Deep venous thrombosis:           No  Superficial venous thrombosis:    No  Deep venous insufficiency:        Not examined  Superficial venous insufficiency: Not examined    Left Leg:-  Deep venous thrombosis:           Yes  Proximal extent of thrombus:      Popliteal At The Knee  Superficial venous thrombosis:    No  Deep venous insufficiency:        Not examined  Superficial venous insufficiency: Not examined      INTERPRETATION/FINDINGS  PROCEDURE:  Color duplex ultrasound imaging of lower extremity veins.    FINDINGS:       Right: The common femoral, deep femoral, femoral, popliteal,  posterior tibial, peroneal, and great saphenous are patent and without   evidence of thrombus;  each is fully compressible and there is no  narrowing of the flow channel on color Doppler imaging.  Phasic flow  is observed in the common femoral vein.       Left:  Consistent with partially occlusive thrombosis with  partial color fill involving the popliteal, one of two posterior  tibials, and one of two peroneal veins as demonstrated by vein  partial-compressibility, and by a narrowing or occlusion of the flow  channel on color Doppler imaging. The remaining segments; common  femoral, deep femoral, femoral and great saphenous are patent and  without evidence of thrombus;  each is fully compressible and there is   no narrowing of the flow channel on color Doppler imaging.  Phasic  flow is observed in the common femoral vein.    IMPRESSION:  There is evidence of left lower extremity vein  thrombosis, as described above.  The ultrasound appearance suggests an   acute versus a chronic process.  No further evidence of DVT on the  right lower  extremity.  There is an incidental finding of a right  popliteal fluid collection.    ADDITIONAL COMMENTS    I have personally reviewed the data relevant to the interpretation of  this  study.    TECHNOLOGIST: Bonnye Fava, RVT  Signed: 03/19/2016 09:39 AM    PHYSICIAN: Roylene Reason., MD  Signed: 03/22/2016 06:40 AM

## 2016-03-22 NOTE — Telephone Encounter (Signed)
V/m left for patient to return my call in reference to her doppler results.

## 2016-03-22 NOTE — Procedures (Signed)
[facility]  *** FINAL REPORT ***    Name: Lindsay [name], Lindsay [name]  MRN: [id number]    Outpatient  DOB: [birth year]  HIS Order #: [id number]  TRAKnet Visit #: [id number]  Date: 19 Mar 2016    TYPE OF TEST: Peripheral Venous Testing    REASON FOR TEST  Pain in limb, Hx of DVT 06/17    Right Leg:-  Deep venous thrombosis:           No  Superficial venous thrombosis:    No  Deep venous insufficiency:        Not examined  Superficial venous insufficiency: Not examined    Left Leg:-  Deep venous thrombosis:           Yes  Proximal extent of thrombus:      Popliteal At The Knee  Superficial venous thrombosis:    No  Deep venous insufficiency:        Not examined  Superficial venous insufficiency: Not examined      INTERPRETATION/FINDINGS  PROCEDURE:  Color duplex ultrasound imaging of lower extremity veins.    FINDINGS:       Right: The common femoral, deep femoral, femoral, popliteal,  posterior tibial, peroneal, and great saphenous are patent and without   evidence of thrombus;  each is fully compressible and there is no  narrowing of the flow channel on color Doppler imaging.  Phasic flow  is observed in the common femoral vein.       Left:  Consistent with partially occlusive thrombosis with  partial color fill involving the popliteal, one of two posterior  tibials, and one of two peroneal veins as demonstrated by vein  partial-compressibility, and by a narrowing or occlusion of the flow  channel on color Doppler imaging. The remaining segments; common  femoral, deep femoral, femoral and great saphenous are patent and  without evidence of thrombus;  each is fully compressible and there is   no narrowing of the flow channel on color Doppler imaging.  Phasic  flow is observed in the common femoral vein.    IMPRESSION:  There is evidence of left lower extremity vein  thrombosis, as described above.  The ultrasound appearance suggests an   acute versus a chronic process.  No further evidence of DVT on the   right lower extremity.  There is an incidental finding of a right  popliteal fluid collection.    ADDITIONAL COMMENTS    I have personally reviewed the data relevant to the interpretation of  this  study.    TECHNOLOGIST: Bonnye Fava, RVT  Signed: 03/19/2016 09:39 AM    PHYSICIAN: Roylene Reason., MD  Signed: 03/22/2016 06:40 AM

## 2016-03-22 NOTE — Telephone Encounter (Signed)
-----   Message from Clarene Critchley, MD sent at 03/22/2016  9:46 AM EDT -----  Sandy Salaam we put her back on 10 mg xarelto dose but as this clot in left leg appears tto be new ( prior was in right leg) she needs a loading dose again of xarelto 20 mg every day for 21 days and then back on the 10 mg every day maintenance dose-can you explain t his to her and call in the 20 mg dose for 21 days? thanks

## 2016-03-22 NOTE — Progress Notes (Signed)
Hi-I  kknow we put her back on 10 mg xarelto dose but as this clot in left leg appears tto be new ( prior was in right leg) she needs a loading dose again of xarelto 20 mg every day for 21 days and then back on the 10 mg every day maintenance dose-can you explain t his to her and call in the 20 mg dose for 21 days? thanks

## 2016-03-22 NOTE — Telephone Encounter (Signed)
Patient notified due to having a new blood clot in her leg she needs to start on Xarelto 20 mg daily for 21 days then reduce to 10 mg daily for maintenance. Patient voiced understanding.

## 2016-03-26 ENCOUNTER — Observation Stay: Payer: BLUE CROSS/BLUE SHIELD | Primary: Internal Medicine

## 2016-03-26 ENCOUNTER — Emergency Department: Admit: 2016-03-26 | Payer: BLUE CROSS/BLUE SHIELD | Primary: Internal Medicine

## 2016-03-26 ENCOUNTER — Inpatient Hospital Stay
Admit: 2016-03-26 | Discharge: 2016-03-26 | Disposition: A | Discharge status: Home / Self Care | Payer: BLUE CROSS/BLUE SHIELD | Attending: Emergency Medicine

## 2016-03-26 ENCOUNTER — Observation Stay: Admit: 2016-03-26 | Payer: BLUE CROSS/BLUE SHIELD | Primary: Internal Medicine

## 2016-03-26 DIAGNOSIS — G459 Transient cerebral ischemic attack, unspecified: Secondary | ICD-10-CM

## 2016-03-26 LAB — METABOLIC PANEL, COMPREHENSIVE
A-G Ratio: 1 — ABNORMAL LOW (ref 1.1–2.2)
ALT (SGPT): 17 U/L (ref 12–78)
AST (SGOT): 14 U/L — ABNORMAL LOW (ref 15–37)
Albumin: 3.5 g/dL (ref 3.5–5.0)
Alk. phosphatase: 126 U/L — ABNORMAL HIGH (ref 45–117)
Anion gap: 6 mmol/L (ref 5–15)
BUN/Creatinine ratio: 15 (ref 12–20)
BUN: 11 MG/DL (ref 6–20)
Bilirubin, total: 0.2 MG/DL (ref 0.2–1.0)
CO2: 29 mmol/L (ref 21–32)
Calcium: 8.6 MG/DL (ref 8.5–10.1)
Chloride: 100 mmol/L (ref 97–108)
Creatinine: 0.72 MG/DL (ref 0.55–1.02)
GFR est AA: >60 mL/min/{1.73_m2} (ref >60)
GFR est non-AA: >60 mL/min/{1.73_m2} (ref >60)
Globulin: 3.6 g/dL (ref 2.0–4.0)
Glucose: 87 mg/dL (ref 65–100)
Potassium: 4 mmol/L (ref 3.5–5.1)
Protein, total: 7.1 g/dL (ref 6.4–8.2)
Sodium: 135 mmol/L — ABNORMAL LOW (ref 136–145)

## 2016-03-26 LAB — CBC WITH AUTOMATED DIFF
ABS. BASOPHILS: 0 10*3/uL (ref 0.0–0.1)
ABS. EOSINOPHILS: 0.2 10*3/uL (ref 0.0–0.4)
ABS. LYMPHOCYTES: 1.9 10*3/uL (ref 0.8–3.5)
ABS. MONOCYTES: 0.4 10*3/uL (ref 0.0–1.0)
ABS. NEUTROPHILS: 2.4 10*3/uL (ref 1.8–8.0)
BASOPHILS: 1 % (ref 0–1)
EOSINOPHILS: 4 % (ref 0–7)
HCT: 33.9 % — ABNORMAL LOW (ref 35.0–47.0)
HGB: 10.7 g/dL — ABNORMAL LOW (ref 11.5–16.0)
LYMPHOCYTES: 40 % (ref 12–49)
MCH: 25.4 PG — ABNORMAL LOW (ref 26.0–34.0)
MCHC: 31.6 g/dL (ref 30.0–36.5)
MCV: 80.3 FL (ref 80.0–99.0)
MONOCYTES: 7 % (ref 5–13)
NEUTROPHILS: 48 % (ref 32–75)
PLATELET: 213 10*3/uL (ref 150–400)
RBC: 4.22 M/uL (ref 3.80–5.20)
RDW: 14.5 % (ref 11.5–14.5)
WBC: 4.9 10*3/uL (ref 3.6–11.0)

## 2016-03-26 LAB — GLUCOSE, POC: Glucose (POC): 97 mg/dL (ref 65–100)

## 2016-03-26 LAB — POC INR: INR (POC): 1.1 (ref <1.2)

## 2016-03-26 LAB — TROPONIN I: Troponin-I, Qt.: <0.04 ng/mL (ref <0.05)

## 2016-03-26 MED ORDER — ESCITALOPRAM 10 MG TAB
10 mg | Freq: Every day | ORAL | Status: DC
Start: 2016-03-26 — End: 2016-03-26

## 2016-03-26 MED ORDER — SODIUM CHLORIDE 0.9 % IJ SYRG
INTRAMUSCULAR | Status: DC | PRN
Start: 2016-03-26 — End: 2016-03-27

## 2016-03-26 MED ORDER — IOPAMIDOL 76 % IV SOLN
370 mg iodine /mL (76 %) | Freq: Once | INTRAVENOUS | Status: AC
Start: 2016-03-26 — End: 2016-03-26
  Administered 2016-03-26: 18:00:00 via INTRAVENOUS

## 2016-03-26 MED ORDER — RIVAROXABAN 15 MG TAB
15 mg | Freq: Two times a day (BID) | ORAL | Status: DC
Start: 2016-03-26 — End: 2016-03-27

## 2016-03-26 MED ORDER — SODIUM CHLORIDE 0.9% BOLUS IV
0.9 % | Freq: Once | INTRAVENOUS | Status: AC
Start: 2016-03-26 — End: 2016-03-26
  Administered 2016-03-26: 18:00:00 via INTRAVENOUS

## 2016-03-26 MED ORDER — DIAZEPAM 5 MG TAB
5 mg | Freq: Every evening | ORAL | Status: DC
Start: 2016-03-26 — End: 2016-03-27
  Administered 2016-03-27: 02:00:00 via ORAL

## 2016-03-26 MED ORDER — ROPINIROLE 1 MG TAB
1 mg | Freq: Every evening | ORAL | Status: DC | PRN
Start: 2016-03-26 — End: 2016-03-27
  Administered 2016-03-27: 02:00:00 via ORAL

## 2016-03-26 MED ORDER — DIAZEPAM 5 MG TAB
5 mg | Freq: Every day | ORAL | Status: DC
Start: 2016-03-26 — End: 2016-03-27
  Administered 2016-03-27: 13:00:00 via ORAL

## 2016-03-26 MED ORDER — MULTIVITAMIN-IRON 9 MG-FOLIC ACID 400 MCG-CALCIUM & MINERALS TABLET
9 mg iron-400 mcg | Freq: Every day | ORAL | Status: DC
Start: 2016-03-26 — End: 2016-03-27
  Administered 2016-03-27: 13:00:00 via ORAL

## 2016-03-26 MED ORDER — ESCITALOPRAM 10 MG TAB
10 mg | Freq: Every evening | ORAL | Status: DC
Start: 2016-03-26 — End: 2016-03-27
  Administered 2016-03-27: 02:00:00 via ORAL

## 2016-03-26 MED ORDER — SODIUM CHLORIDE 0.9 % IJ SYRG
Freq: Three times a day (TID) | INTRAMUSCULAR | Status: DC
Start: 2016-03-26 — End: 2016-03-27
  Administered 2016-03-26 – 2016-03-27 (×3): via INTRAVENOUS

## 2016-03-26 MED ORDER — PANTOPRAZOLE 40 MG TAB, DELAYED RELEASE
40 mg | Freq: Every evening | ORAL | Status: DC
Start: 2016-03-26 — End: 2016-03-27
  Administered 2016-03-27: 02:00:00 via ORAL

## 2016-03-26 MED ORDER — HYDROCODONE-ACETAMINOPHEN 7.5 MG-325 MG TAB
Freq: Three times a day (TID) | ORAL | Status: DC | PRN
Start: 2016-03-26 — End: 2016-03-27
  Administered 2016-03-26 – 2016-03-27 (×2): via ORAL

## 2016-03-26 MED ORDER — NITROGLYCERIN 0.4 MG SUBLINGUAL TAB
0.4 mg | SUBLINGUAL | Status: DC | PRN
Start: 2016-03-26 — End: 2016-03-27

## 2016-03-26 MED ORDER — .PHARMACY TO SUBSTITUTE PER PROTOCOL
Status: DC | PRN
Start: 2016-03-26 — End: 2016-03-27

## 2016-03-26 MED ORDER — ACETAMINOPHEN (TYLENOL) SOLUTION 32MG/ML
ORAL | Status: DC | PRN
Start: 2016-03-26 — End: 2016-03-27

## 2016-03-26 MED ORDER — SODIUM CHLORIDE 0.9 % IJ SYRG
Freq: Once | INTRAMUSCULAR | Status: AC
Start: 2016-03-26 — End: 2016-03-26
  Administered 2016-03-26: 18:00:00 via INTRAVENOUS

## 2016-03-26 MED ORDER — OMEPRAZOLE 20 MG CAP, DELAYED RELEASE
20 mg | Freq: Every evening | ORAL | Status: DC
Start: 2016-03-26 — End: 2016-03-26

## 2016-03-26 MED ORDER — ASPIRIN 81 MG CHEWABLE TAB
81 mg | Freq: Every day | ORAL | Status: DC
Start: 2016-03-26 — End: 2016-03-27
  Administered 2016-03-27: 13:00:00 via ORAL

## 2016-03-26 MED ORDER — ACETAMINOPHEN 325 MG TABLET
325 mg | ORAL | Status: DC | PRN
Start: 2016-03-26 — End: 2016-03-27

## 2016-03-26 MED ORDER — ACETAMINOPHEN 650 MG RECTAL SUPPOSITORY
650 mg | RECTAL | Status: DC | PRN
Start: 2016-03-26 — End: 2016-03-27

## 2016-03-26 MED ORDER — FOLIC ACID 1 MG TAB
1 mg | Freq: Every day | ORAL | Status: DC
Start: 2016-03-26 — End: 2016-03-27
  Administered 2016-03-27: 13:00:00 via ORAL

## 2016-03-26 MED ORDER — THIAMINE HCL 100 MG TAB
100 mg | Freq: Every day | ORAL | Status: DC
Start: 2016-03-26 — End: 2016-03-27
  Administered 2016-03-27: 13:00:00 via ORAL

## 2016-03-26 MED ORDER — RIVAROXABAN 20 MG TAB
20 mg | Freq: Every day | ORAL | Status: AC
Start: 2016-03-26 — End: 2016-03-26
  Administered 2016-03-26: 22:00:00 via ORAL

## 2016-03-26 MED ORDER — SODIUM CHLORIDE 0.9 % IV
INTRAVENOUS | Status: DC
Start: 2016-03-26 — End: 2016-03-27
  Administered 2016-03-27: 02:00:00 via INTRAVENOUS

## 2016-03-26 MED ORDER — LORAZEPAM 2 MG/ML IJ SOLN
2 mg/mL | INTRAMUSCULAR | Status: AC
Start: 2016-03-26 — End: 2016-03-26
  Administered 2016-03-26: 20:00:00 via INTRAVENOUS

## 2016-03-26 MED ORDER — PRAVASTATIN 40 MG TAB
40 mg | Freq: Every evening | ORAL | Status: DC
Start: 2016-03-26 — End: 2016-03-27
  Administered 2016-03-27: 02:00:00 via ORAL

## 2016-03-26 MED ORDER — RIVAROXABAN 20 MG TAB
20 mg | Freq: Every day | ORAL | Status: DC
Start: 2016-03-26 — End: 2016-03-26
  Administered 2016-03-26: 22:00:00 via ORAL

## 2016-03-26 MED ORDER — ONDANSETRON (PF) 4 MG/2 ML INJECTION
4 mg/2 mL | Freq: Four times a day (QID) | INTRAMUSCULAR | Status: DC | PRN
Start: 2016-03-26 — End: 2016-03-27

## 2016-03-26 MED FILL — SODIUM CHLORIDE 0.9 % IV: INTRAVENOUS | Qty: 100

## 2016-03-26 MED FILL — SODIUM CHLORIDE 0.9 % IV: INTRAVENOUS | Qty: 1000

## 2016-03-26 MED FILL — ISOVUE-370  76 % INTRAVENOUS SOLUTION: 370 mg iodine /mL (76 %) | INTRAVENOUS | Qty: 100

## 2016-03-26 MED FILL — LORAZEPAM 2 MG/ML IJ SOLN: 2 mg/mL | INTRAMUSCULAR | Qty: 1

## 2016-03-26 MED FILL — NORMAL SALINE FLUSH 0.9 % INJECTION SYRINGE: INTRAMUSCULAR | Qty: 10

## 2016-03-26 MED FILL — HYDROCODONE-ACETAMINOPHEN 7.5 MG-325 MG TAB: ORAL | Qty: 1

## 2016-03-26 MED FILL — XARELTO 20 MG TABLET: 20 mg | ORAL | Qty: 1

## 2016-03-26 NOTE — Progress Notes (Signed)
Bedside shift change report given to Lanora ManisElizabeth RN (oncoming nurse) by Gaynelle CageJen RN (offgoing nurse). Report included the following information SBAR, Kardex, ED Summary, Intake/Output, MAR, Recent Results and Cardiac Rhythm SR.

## 2016-03-26 NOTE — Progress Notes (Signed)
Spiritual Care Assessment/Progress Notes    Lindsay BillingsKathy A [name] [id number]  [id number]    06/17/1956  60 y.o.  female    Patient Telephone Number: There is no home phone number on file.   Religious Affiliation: Baptist   Language: English   Extended Emergency Contact Information  Primary Emergency Contact: [name],Lindsay  Address: [address]           [place], [place] [id number] [place]  Home Phone: [phone]  Mobile Phone: [phone]  Relation: Spouse   Patient Active Problem List    Diagnosis Date Noted   ??? RLS (restless legs syndrome) 03/16/2016   ??? Primary osteoarthritis of right knee 12/16/2015   ??? Primary osteoarthritis of left knee 10/28/2015   ??? Abnormal brain scan    ??? Anxiety 12/28/2012     Class: Chronic   ??? Dyslipidemia, goal LDL below 100 12/27/2012   ??? Pulmonary embolism (HCC) 12/26/2012   ??? Chest pain 08/10/2011   ??? Headache 06/05/2009   ??? Depression 04/01/2009        Date: 03/26/2016       Level of Religious/Spiritual Activity:           Involved in faith tradition/spiritual practice             Not involved in faith tradition/spiritual practice           Spiritually oriented             Claims no spiritual orientation             seeking spiritual identity           Feels alienated from religious practice/tradition           Feels angry about religious practice/tradition           Spirituality/religious tradition is a Theatre stage managerresource for coping at this time.           Not able to assess due to medical condition    Services Provided Today:           crisis intervention             reading Scriptures           spiritual assessment             prayer           empathic listening/emotional support           rites and rituals (cite in comments)           life review              religious support           theological development             advocacy           ethical dialog              blessing           bereavement support             support to family            anticipatory grief support            help with AMD           spiritual guidance             meditation      Spiritual Care Needs  Emotional Support           Spiritual/Religious Care           Loss/Adjustment           Advocacy/Referral                /Ethics           No needs expressed at               this time           Other: (note in               comments)  Spiritual Care Plan           Follow up visits with               pt/family           Provide materials           Schedule sacraments           Contact Community               Clergy           Follow up as needed           Other: (note in               comments)     Comments: Responded to Code S in ED1. Introduced self to patient and explained chaplain's role in care team. Patient is in good spirits and provided life review and insight in to family dynamics. Patient is Baptist and her faith is an important foundation for her values and outlook. Provided supportive presence during parts of patient's assessment and advised of chaplain availability as needed or desired.     Chaplain Lindsay [name], M.Div.  Chaplain Paging Service [phone])

## 2016-03-26 NOTE — Progress Notes (Signed)
Primary Nurse Michaelle CopasJennifer L Lister, RN and charge nurse Alton RevereJWinecoff, RN performed a dual skin assessment on this patient No impairment noted  Braden score is 22

## 2016-03-26 NOTE — ED Provider Notes (Signed)
HPI Comments: 60 y.o. female with past medical history significant for PE, DVT, depression, torn rotator cuff, L-ankle fracture, arthritis, chronic pain who presents from home with chief complaint of chest pain.  Pt had a L-knee replacement and R-knee replacement done five months ago and three months ago, respectively, by Dr. Mardi Mainland.  Pt states she was found to have a DVT in her R-leg after the procedure and reports having a history of PE.  Pt states she has had some recent L-leg pain and swelling and saw her orthopedist one week ago where she received a [place].  Pt was diagnosed with a DVT in her L-leg and treated with anticoagulants.  Pt states for the last couple days she has chest pain described as heaviness and fullness.  Pt states she has had accompanying SOB and a HA located in the top of her head.  Pt states she was en route to the ED today when she had sudden onset of numbness described as "a needle and thread" in her R-lower lip.  Pt reports having accompanying numbness and tingling in her bilateral arms and legs.  Pt denies having a facial droop.  There are no other acute medical concerns at this time.    1:38 PM  Lovey Newcomer, MD reviewed electronic medical record system for any past medical records that were available that may contribute to the patients current condition.  Pt one week ago had a BUN and creatinine that were normal.  Pt had bilateral doppler studies done which showed acute thrombosis in L-lower extremity - partial occlusive thrombosis involving popliteal, one of two posterior tibials, one of two peroneal veins.  There was no evidence of DVT on R-leg.      PCP: Clarene Critchley, MD    Orthopedic Surgeon: Dr. Mardi Mainland    Note written by Juan Quam Philippi, Neurosurgeon, as dictated by Lovey Newcomer, MD 1:29 PM    The history is provided by the patient.        Past Medical History:   Diagnosis Date   ??? Abnormal brain scan    ??? Arthritis    ??? Chronic pain    ??? Depression     ??? Falls frequently    ??? Fracture of left ankle    ??? Headache 2011   ??? Ill-defined condition 09/15/2015    FX R HAND   ??? Other and unspecified hyperlipidemia 12/27/2012   ??? PE (pulmonary embolism) 12/2012   ??? RLS (restless legs syndrome) 03/16/2016    Dr Scarlett Presto   ??? Torn rotator cuff     right       Past Surgical History:   Procedure Laterality Date   ??? BREAST SURGERY PROCEDURE UNLISTED  2002    AUGMENTATION   ??? HX APPENDECTOMY     ??? HX CESAREAN SECTION      x4   ??? HX GI      COLONOSCOPY   ??? 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 HX ORTHOPAEDIC  08/2004    rotater cuff r. shoulder   ??? HX ORTHOPAEDIC      left ankle fracture treated with air cast   ??? HX ORTHOPAEDIC  6/13    left shoulder fxt and dislocation   ??? HX OTHER SURGICAL  2010    left foot torn tendon   ??? HX TUBAL LIGATION           Family History:   Problem Relation Age  of Onset   ??? Heart Disease Mother    ??? Heart Disease Father    ??? Cancer Father      thyroid cancer   ??? Arthritis-rheumatoid Sister    ??? No Known Problems Son    ??? No Known Problems Daughter    ??? No Known Problems Daughter    ??? No Known Problems Daughter    ??? Anesth Problems Neg Hx        Social History     Social History   ??? Marital status: MARRIED     Spouse name: N/A   ??? Number of children: N/A   ??? Years of education: N/A     Occupational History   ??? Not on file.     Social History Main Topics   ??? Smoking status: Never Smoker   ??? Smokeless tobacco: Never Used   ??? Alcohol use 8.4 oz/week     14 Cans of beer per week      Comment: 2 MICHELOB ULTRA PER DAY   ??? Drug use: No   ??? Sexual activity: Yes     Partners: Male     Other Topics Concern   ??? Not on file     Social History Narrative         ALLERGIES: Review of patient's allergies indicates no known allergies.    Review of Systems   Constitutional: Negative for activity change, appetite change and fatigue.   HENT: Negative for ear pain, facial swelling, sore throat and trouble swallowing.     Eyes: Negative for pain, discharge and visual disturbance.   Respiratory: Positive for shortness of breath. Negative for chest tightness and wheezing.    Cardiovascular: Negative for chest pain and palpitations.   Gastrointestinal: Negative for abdominal pain, blood in stool, nausea and vomiting.   Genitourinary: Negative for difficulty urinating, flank pain and hematuria.   Musculoskeletal: Positive for arthralgias (R-knee). Negative for joint swelling, myalgias and neck pain.   Skin: Negative for color change and rash.   Neurological: Positive for numbness (R-lower lip; bilateral arms/legs) and headaches. Negative for dizziness, facial asymmetry and weakness.   Hematological: Negative for adenopathy. Does not bruise/bleed easily.   Psychiatric/Behavioral: Negative for behavioral problems, confusion and sleep disturbance.   All other systems reviewed and are negative.      Vitals:    03/26/16 1314   BP: 138/59   Pulse: 63   Resp: 16   Temp: 98.2 ??F (36.8 ??C)   SpO2: 98%   Weight: 73 kg (161 lb)   Height: 5\' 3"  (1.6 m)            Physical Exam   Constitutional: She is oriented to person, place, and time. She appears well-developed and well-nourished. No distress.   HENT:   Head: Normocephalic and atraumatic.   Nose: Nose normal.   Mouth/Throat: Oropharynx is clear and moist.   Eyes: Conjunctivae and EOM are normal. Pupils are equal, round, and reactive to light. No scleral icterus.   Neck: Normal range of motion. Neck supple. No JVD present. No tracheal deviation present. No thyromegaly present.   No carotid bruits noted.   Cardiovascular: Normal rate, regular rhythm, normal heart sounds and intact distal pulses.  Exam reveals no gallop and no friction rub.    No murmur heard.  Pulmonary/Chest: Effort normal and breath sounds normal. No respiratory distress. She has no wheezes. She has no rales. She exhibits no tenderness.   Abdominal: Soft. Bowel sounds are  normal. She exhibits no distension and  no mass. There is no tenderness. There is no rebound and no guarding.   Musculoskeletal: Normal range of motion. She exhibits no edema or tenderness.   Lymphadenopathy:     She has no cervical adenopathy.   Neurological: She is alert and oriented to person, place, and time. She has normal reflexes. No cranial nerve deficit. Coordination normal.   Skin: Skin is warm and dry. No rash noted. No erythema.   Psychiatric: She has a normal mood and affect. Her behavior is normal. Judgment and thought content normal.   Nursing note and vitals reviewed.  Note written by Juan QuamZachary A. Philippi, Neurosurgeoncribe, as dictated by Lovey Newcomerobert G Rafferty Postlewait, MD 1:29 PM       MDM  Number of Diagnoses or Management Options     Amount and/or Complexity of Data Reviewed  Clinical lab tests: ordered and reviewed  Tests in the radiology section of CPT??: ordered and reviewed  Decide to obtain previous medical records or to obtain history from someone other than the patient: yes  Review and summarize past medical records: yes  Independent visualization of images, tracings, or specimens: yes    Risk of Complications, Morbidity, and/or Mortality  Presenting problems: high  Diagnostic procedures: high  Management options: high    Patient Progress  Patient progress: stable    ED Course       Procedures    CONSULT NOTE:  1:42 PM Lovey Newcomerobert G Skarlett Sedlacek, MD spoke with Dr. Henri MedalKiely, Consult for Teleneurology.  Discussed available diagnostic tests and clinical findings.  He recommends admitting her for a TIA workup.     ED EKG interpretation:  Rhythm: normal sinus rhythm; and regular . Rate (approx.): 56 bpm; Axis: left axis deviation; No ectopy.  No acute changes.  Note written by Juan QuamZachary A. Philippi, Neurosurgeoncribe, as dictated by Lovey Newcomerobert G Charlyn Vialpando, MD 2:04 PM    CONSULT NOTE:  2:39 PM Lovey Newcomerobert G Rhylie Stehr, MD spoke with Dr. Christie BeckersMathur, Consult for Hospitalist.  Discussed available diagnostic tests and clinical findings.  He is in agreement with care plans as outlined.  He will see and admit.     PROGRESS NOTE:  2:58 PM  Will give Ativan because Pt has history of restless leg syndrome when she gets irritated like this, she lies around.  She is normally able to ambulate well.

## 2016-03-26 NOTE — Telephone Encounter (Signed)
Incoming call from patient complaining of pressure in her chest that has been going on for over a week. She recently was dx with a DVT last week and was started back on Xarelto 20 mg. Patient has a hx of PE's as well. Patient expressed her husband is adamant she go to the hospital to be evaluated. She denies chest pain, sob, sweats, numbness & tingling. She doesn't feel the same way she felt when she was dx with her PE back in 2014 which questions if she really needs to go to the ER. I explained with her history with blood clots & the ongoing pressure in her chest she should be evaluated to r/o PE or anything cardiac going on. I explained if her work up comes back normal in ER she will be seen in our office on Tues for follow up. Patient voiced understanding.

## 2016-03-26 NOTE — Other (Signed)
TRANSFER - OUT REPORT:    Verbal report given to [place], RN (name) on Lindsay [name]  being transferred to [facility] [facility] (unit) for routine progression of care       Report consisted of patient???s Situation, Background, Assessment and   Recommendations(SBAR).     Information from the following report(s) SBAR, ED Summary and MAR was reviewed with the receiving nurse.    Lines:   Peripheral IV 03/26/16 Left Antecubital (Active)        Opportunity for questions and clarification was provided.

## 2016-03-26 NOTE — ED Notes (Signed)
Dr. Lowell Guitar into CT to evaluate patient.  Dr. Lowell Guitar requesting CTA of chest to R/O PE.  IV est for CTA.

## 2016-03-26 NOTE — H&P (Signed)
[facility]   [address]   [place], [place] [address]   HISTORY AND PHYSICAL       Name:  Lindsay [name], Lindsay [name]   MR#:  [id number]   DOB:  May 08, 1956   Account #:  [id number]        Date of Adm:  03/26/2016       CHIEF COMPLAINT: Chest pain and right lip numbness.    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 The patient reports that she had   an unprovoked DVT in 2014 on the left lower lobe, was put on Xarelto   for a few months and then was taken off. The patient reports that she   took Xarelto for a few months, and her symptoms resolved. The patient   underwent a TKA in the left lower extremity in 10/2015. Seven weeks   later, she went to another TKA in right lower extremity, and the next   day she was diagnosed with a right lower extremity DVT. The patient,   then, was diagnosed with a left lower lobe pulmonary embolism, was   put on Xarelto. The patient reports that about 2 months later, in   August, she had some pain in her left knee. The patient reports that   there were some blood clots in her left lower extremity recently, when   she had ultrasound done, and she was told to continue Xarelto. The   patient reports that last night she started experiencing some chest   heaviness. Reports that she had mild shortness of breath associated   with that, got concerned, and decided to come to the hospital today.   While coming to the hospital, she had some right lip numbness, and   she felt as if "somebody was taking a needle with the thread, threading   her lower lip, and then pulling on the thread." The patient reports the   feeling lasted for a few seconds. She was in the car, looked in the    mirror, and felt that she had mild subjective droop on the right side of   her face, got concerned, and decided to come to the hospital. The   patient reports that currently all her symptoms have resolved. The   patient denied any other complaints or problems. Denies any   headache, blurry vision, sore throat, trouble swallowing, trouble with   speech, any chest pain, shortness breath, cough, fever, chills,   abdominal pain, constipation, diarrhea, urinary symptoms, focal or   generalized neurological weakness, recent travel, sick contacts, falls,   injuries or any other concerns or problems. The patient denies any   hematemesis or melena.    PAST MEDICAL HISTORY: See above.    HOME MEDICATIONS: Currently the patient is on   1. Xarelto 20 mg daily.   2. Norco 7/325 mg every 8 hours as needed.   3. Diazepam 10 mg p.o. nightly.   4. Gabapentin 300 mg daily.   5. Requip 2 tablets by mouth 1-3 hours before bedtime.   6. Diazepam 5 mg daily in the morning.   7. Ergocalciferol.   8. Lexapro 20 mg daily.   9. Omeprazole 20 mg nightly.    SOCIAL  HISTORY: The patient denies tobacco abuse. Drinks 2 beers   on a daily basis. Denies IV drug abuse. Reports if she does not drink   has never gone through withdrawals.    REVIEW OF SYSTEMS: All systems reviewed and were found to be   essentially negative, except for the symptoms mentioned above.    ALLERGIES: NO KNOWN DRUG ALLERGIES.    FAMILY HISTORY: Mother had history of heart disease, father had   history of heart disease, father had history of thyroid cancer, sister has   history of rheumatoid arthritis.    PHYSICAL EXAMINATION   VITAL SIGNS: Temperature 98.2, pulse 69, respiratory rate 17, blood   pressure 130/75, pulse oximetry 90% on room air.   GENERAL: Alert, oriented x3, awake. No acute distress, resting in bed,   pleasant female, appears to be stated age.   HEENT: Pupils equal and reactive to light. Dry mucous membranes.   Tympanic membranes clear.   NECK: Supple.    CHEST: Clear to auscultation bilaterally.   CORONARY S1, S2 were heard.   ABDOMEN: Soft, nontender, nondistended. Bowel sounds are   physiological.   EXTREMITIES: No clubbing, no cyanosis, no edema.   NEUROPSYCHIATRIC: Pleasant mood and affect. Cranial nerves 2   through 12 grossly intact. Sensory grossly within normal limits. DTRs   2+/4. Strength 5/5 into 4.   SKIN: Warm.    LABORATORY DATA: White count 12.9, hemoglobin 7.7, hematocrit   33.9, platelets 213. Sodium 135, potassium 4, chloride 100,   bicarbonate 29, anion gap 6, glucose 87, BUN 11, creatinine 0.72,   calcium 8.6, bilirubin total 0.2, ALT 17, AST 14, alkaline phosphatase   126. INR was 1.    DIAGNOSTIC DATA: CTA of the chest shows no pulmonary embolism.   CT of the head shows periventricular hypoattenuation, suggesting   microvascular ischemic change of indeterminate age. No acute   intracranial bleed, shift or mass. EKG shows sinus bradycardia, with   nonspecific ST changes.    ASSESSMENT AND PLAN   1. Transient ischemic attack. The patient will be observed on a   telemetry bed. Will get an MRI of the brain. Start the patient on aspirin   and statin. Will provide neurovascular checks. Physical therapy,   occupational therapy and speech consultations and close monitoring.   Neurology consult has been requested. The patient has seen Dr.   Alison Murrayansom in the past. Will continue to closely monitor. Will get an   echocardiogram, and further intervention will be per hospital course.   Reassess as needed.   2. Chest pain. Appears to be musculoskeletal. CTA of the chest is   negative for pulmonary embolism. Will continue Xarelto. Will get   troponins. The patient on aspirin. Will get an echocardiogram, and   further intervention will be per hospital course. Reassess as needed.   May consider cardiology consult if symptoms persist. Continue to   monitor.   3. Anemia, chronic. Will monitor. No signs of bleeding.  hemoptysis or   hematuria.    4. Restless leg syndrome. Will continue home medications. Continue   to monitor.   5. History of pulmonary embolism and deep venous thrombosis.   Continue home medication. No obvious signs of deep venous   thrombosis at this point of time. CTA negative for pulmonary embolism.   6. Gastrointestinal and deep venous thrombosis prophylaxis. The   patient currently is on Xarelto.        Rebeca AllegraMUKTAK Aaditya Letizia, MD  MM / [facility]   D:  03/26/2016   16:06   T:  03/26/2016   16:48   Job #:  [id number]

## 2016-03-26 NOTE — Progress Notes (Signed)
Admission Medication Reconciliation:    Information obtained from: Patient, RX Query    Significant PMH/Disease States:   Past Medical History:   Diagnosis Date   ??? Abnormal brain scan    ??? Arthritis    ??? Chronic pain    ??? Depression    ??? Falls frequently    ??? Fracture of left ankle    ??? Headache 2011   ??? Ill-defined condition 09/15/2015    FX R HAND   ??? Other and unspecified hyperlipidemia 12/27/2012   ??? PE (pulmonary embolism) 12/2012   ??? RLS (restless legs syndrome) 03/16/2016    Dr Scarlett Presto   ??? Torn rotator cuff     right       Chief Complaint for this Admission:  SOB, numbness    Allergies:  Review of patient's allergies indicates no known allergies.    Prior to Admission Medications:   Prior to Admission Medications   Prescriptions Last Dose Informant Patient Reported? Taking?   HYDROcodone-acetaminophen (NORCO) 7.5-325 mg per tablet 03/25/2016 at 1700  Yes Yes   Sig: Take 1 Tab by mouth every eight (8) hours as needed for Pain.   diazePAM (VALIUM) 10 mg tablet 03/25/2016 at 1700  Yes Yes   Sig: Take 10 mg by mouth nightly. (Takes 5 mg qam and 10 mg qhs)   diazePAM (VALIUM) 5 mg tablet 03/25/2016 at 0700  Yes Yes   Sig: Take 5 mg by mouth daily. (Takes 5 mg qam and 10 mg qhs)   ergocalciferol (ERGOCALCIFEROL) 50,000 unit capsule 03/21/2016 at 1700  No Yes   Sig: Take 1 Cap by mouth every seven (7) days.   escitalopram oxalate (LEXAPRO) 20 mg tablet 03/25/2016 at 1700  No Yes   Sig: Take 1 Tab by mouth daily.   gabapentin enacarbil (HORIZANT) 300 mg TbER 03/25/2016 at 1700  No Yes   Sig: Take 300 Tabs by mouth daily.   omeprazole (PRILOSEC) 20 mg capsule 03/25/2016 at 1700  Yes Yes   Sig: Take 20 mg by mouth nightly.   rOPINIRole (REQUIP) 1 mg tablet 03/25/2016 at 1700  No Yes   Sig: Take 2 tabs by mouth 1-3 hours before bedtime   rivaroxaban (XARELTO) 20 mg tab tablet 03/25/2016 at 1700  Yes Yes   Sig: Take 20 mg by mouth daily (with dinner).      Facility-Administered Medications: None          Comments/Recommendations: Patient is an excellent historian; RX Query also utilized.  Confirmed NKDA and last dose of all medications.  Patient reports she developed thromboembolisms in her leg and lung, seen Friday at the doctor's office.  She had been taking Xarelto 15 mg, which was increased to 20 mg qpm.  She was also prescribed Norco for the increased pain, and reports that she stopped taking additional tylenol and ibuprofen at prescribers advice.  She currently takes Valium twice daily - 5 mg qam and 10 mg qhs.  Patient reports stopping wellbutrin recently as she thought it may be contributing to her worsening RLS.  She takes almost all of her mediations in the evening, usually around 5-6 PM.    New:  Norco, Xarelto  Changed:  Valium (5q8h --> 5 qam + 10 qhs)  D/C:  Tylenol, ibuprofen

## 2016-03-26 NOTE — Progress Notes (Signed)
Received call from MRI staff that pt is unable to hold still for adequate imaging. Pt requesting HS meds for RLS be brought to her (Requip and Valium). MRI staff stated meds can be administered per scheduled time (HS) and they will redo the MRI this evening approx 2100-2130. Pt will be enroute back to [facility].

## 2016-03-26 NOTE — ED Triage Notes (Addendum)
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 I had a blood clot in my right leg June 15, had surgery, and also had a blood clot in my lung June 16. Last week, they found a blood clot in my left leg. Now, my lungs just feel heavy and I have a pain in my chest. I am just concerned maybe it's small blood clots. My PCP office sent me here." patient also describes a "pulling like a needle and thread" in her lower lip that she describes altered sensation

## 2016-03-27 ENCOUNTER — Observation Stay: Admit: 2016-03-27 | Payer: BLUE CROSS/BLUE SHIELD | Primary: Internal Medicine

## 2016-03-27 LAB — ECHOCARDIOGRAM COMPLETE 2D W DOPPLER W COLOR: Left Ventricular Ejection Fraction: 63

## 2016-03-27 LAB — EKG 12-LEAD
Atrial Rate: 56 {beats}/min
P Axis: -20 degrees
P-R Interval: 134 ms
Q-T Interval: 452 ms
QRS Duration: 76 ms
QTc Calculation (Bazett): 436 ms
R Axis: 21 degrees
T Axis: 27 degrees
Ventricular Rate: 56 {beats}/min

## 2016-03-27 LAB — CBC W/O DIFF
HCT: 34.5 % — ABNORMAL LOW (ref 35.0–47.0)
HGB: 10.9 g/dL — ABNORMAL LOW (ref 11.5–16.0)
MCH: 25 PG — ABNORMAL LOW (ref 26.0–34.0)
MCHC: 31.6 g/dL (ref 30.0–36.5)
MCV: 79.1 FL — ABNORMAL LOW (ref 80.0–99.0)
PLATELET: 222 10*3/uL (ref 150–400)
RBC: 4.36 M/uL (ref 3.80–5.20)
RDW: 14.4 % (ref 11.5–14.5)
WBC: 7.5 10*3/uL (ref 3.6–11.0)

## 2016-03-27 LAB — EKG, 12 LEAD, INITIAL
Atrial Rate: 56 {beats}/min
Calculated P Axis: -20 degrees
Calculated R Axis: 21 degrees
Calculated T Axis: 27 degrees
P-R Interval: 134 ms
Q-T Interval: 452 ms
QRS Duration: 76 ms
QTC Calculation (Bezet): 436 ms
Ventricular Rate: 56 {beats}/min

## 2016-03-27 LAB — LIPID PANEL
CHOL/HDL Ratio: 4.8 (ref 0–5.0)
Cholesterol, total: 245 MG/DL — ABNORMAL HIGH (ref <200)
HDL Cholesterol: 51 MG/DL
LDL, calculated: 138.4 MG/DL — ABNORMAL HIGH (ref 0–100)
Triglyceride: 278 MG/DL — ABNORMAL HIGH (ref <150)
VLDL, calculated: 55.6 MG/DL

## 2016-03-27 LAB — GLUCOSE, POC
Glucose (POC): 106 mg/dL — ABNORMAL HIGH (ref 65–100)
Glucose (POC): 89 mg/dL (ref 65–100)

## 2016-03-27 LAB — HEMOGLOBIN A1C WITH EAG
Est. average glucose: 126 mg/dL
Hemoglobin A1c: 6 % (ref 4.2–6.3)

## 2016-03-27 MED ORDER — ASPIRIN 81 MG CHEWABLE TAB
81 mg | ORAL_TABLET | Freq: Every day | ORAL | 0 refills | Status: AC
Start: 2016-03-27 — End: ?

## 2016-03-27 MED ORDER — ROPINIROLE 1 MG TAB
1 mg | ORAL | Status: AC
Start: 2016-03-27 — End: 2016-03-27
  Administered 2016-03-27: 14:00:00 via ORAL

## 2016-03-27 MED ORDER — PRAVASTATIN 40 MG TAB
40 mg | ORAL_TABLET | Freq: Every evening | ORAL | 0 refills | Status: DC
Start: 2016-03-27 — End: 2016-03-30

## 2016-03-27 MED FILL — NORMAL SALINE FLUSH 0.9 % INJECTION SYRINGE: INTRAMUSCULAR | Qty: 10

## 2016-03-27 MED FILL — ESCITALOPRAM 10 MG TAB: 10 mg | ORAL | Qty: 2

## 2016-03-27 MED FILL — PANTOPRAZOLE 40 MG TAB, DELAYED RELEASE: 40 mg | ORAL | Qty: 1

## 2016-03-27 MED FILL — PRAVASTATIN 40 MG TAB: 40 mg | ORAL | Qty: 1

## 2016-03-27 MED FILL — ROPINIROLE 1 MG TAB: 1 mg | ORAL | Qty: 2

## 2016-03-27 MED FILL — CHILDREN'S ASPIRIN 81 MG CHEWABLE TABLET: 81 mg | ORAL | Qty: 1

## 2016-03-27 MED FILL — HYDROCODONE-ACETAMINOPHEN 7.5 MG-325 MG TAB: ORAL | Qty: 1

## 2016-03-27 MED FILL — XARELTO 15 MG TABLET: 15 mg | ORAL | Qty: 1

## 2016-03-27 MED FILL — DIAZEPAM 5 MG TAB: 5 mg | ORAL | Qty: 2

## 2016-03-27 MED FILL — VITAMIN B-1 100 MG TABLET: 100 mg | ORAL | Qty: 1

## 2016-03-27 MED FILL — DIAZEPAM 5 MG TAB: 5 mg | ORAL | Qty: 1

## 2016-03-27 MED FILL — THERA M PLUS (FERROUS FUMARATE) 9 MG IRON-400 MCG TABLET: 9 mg iron-400 mcg | ORAL | Qty: 1

## 2016-03-27 MED FILL — FOLIC ACID 1 MG TAB: 1 mg | ORAL | Qty: 1

## 2016-03-27 NOTE — Consults (Signed)
NEUROLOGY  03/27/2016     Consulted by: Jettie Booze, MD        Patient ID:  Lindsay [name]  [id number]  60 y.o.  1956-03-31    Chief Complaint   Patient presents with   ??? Shortness of Breath   ??? 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  She came to the hospital because of concerns for worsening chest pain and shortness of breath in the setting of PE.  While on route to the hospital she felt like the bottom right lower lip had a sensation of pulling needle and thread.  This lasted just a few seconds.  Then she became concerned and began looking in the mirror wondering if she had a facial droop.  She thought there might of been an asymmetry to the right side of her mouth.  As a result of this presentation a stroke workup has been pursued.  Currently she feels her baseline as she did on arriving to the emergency room.  A baby aspirin was added to her regimen.  MRI brain was done this morning.  It shows bilateral changes but no abnormal restricted diffusion.    Review of Systems   Neurological: Positive for sensory change.        Questionable right facial droop   All other systems reviewed and are negative.      Past Medical History:   Diagnosis Date   ??? Abnormal brain scan    ??? Arthritis    ??? Chronic pain    ??? Depression    ??? Falls frequently    ??? Fracture of left ankle    ??? Headache 2011   ??? Ill-defined condition 09/15/2015    FX R HAND   ??? Other and unspecified hyperlipidemia 12/27/2012   ??? PE (pulmonary embolism) 12/2012   ??? RLS (restless legs syndrome) 03/16/2016    Dr Roseanne Reno   ??? Torn rotator cuff     right     Family History   Problem Relation Age of Onset   ??? Heart Disease Mother    ??? Heart Disease Father    ??? Cancer Father      thyroid cancer   ??? Arthritis-rheumatoid Sister    ??? No Known Problems Son    ??? No Known Problems Daughter    ??? No Known Problems Daughter    ??? No Known Problems Daughter    ??? Anesth Problems Neg Hx      Social History     Social  History   ??? Marital status: MARRIED     Spouse name: N/A   ??? Number of children: N/A   ??? Years of education: N/A     Occupational History   ??? Not on file.     Social History Main Topics   ??? Smoking status: Never Smoker   ??? Smokeless tobacco: Never Used   ??? Alcohol use 8.4 oz/week     14 Cans of beer per week      Comment: 2 MICHELOB ULTRA PER DAY   ??? Drug use: No   ??? Sexual activity: Yes     Partners: Male     Other Topics Concern   ??? Not on file     Social History Narrative     Current Facility-Administered Medications   Medication Dose Route Frequency   ??? diazePAM (VALIUM) tablet 10 mg  10 mg Oral QHS   ??? diazePAM (VALIUM) tablet 5 mg  5 mg Oral DAILY   ??? .PHARMACY TO SUBSTITUTE PER PROTOCOL    Per Protocol   ??? HYDROcodone-acetaminophen (NORCO) 7.5-325 mg per tablet 1 Tab  1 Tab Oral Q8H PRN   ??? sodium chloride (NS) flush 5-10 mL  5-10 mL IntraVENous Q8H   ??? sodium chloride (NS) flush 5-10 mL  5-10 mL IntraVENous PRN   ??? acetaminophen (TYLENOL) tablet 650 mg  650 mg Oral Q4H PRN    Or   ??? acetaminophen (TYLENOL) solution 650 mg  650 mg Per NG tube Q4H PRN    Or   ??? acetaminophen (TYLENOL) suppository 650 mg  650 mg Rectal Q4H PRN   ??? 0.9% sodium chloride infusion  50 mL/hr IntraVENous CONTINUOUS   ??? ondansetron (ZOFRAN) injection 4 mg  4 mg IntraVENous Q6H PRN   ??? aspirin chewable tablet 81 mg  81 mg Oral DAILY   ??? pravastatin (PRAVACHOL) tablet 40 mg  40 mg Oral QHS   ??? nitroglycerin (NITROSTAT) tablet 0.4 mg  0.4 mg SubLINGual Q6VHQ PRN   ??? folic acid (FOLVITE) tablet 1 mg  1 mg Oral DAILY   ??? thiamine (B-1) tablet 100 mg  100 mg Oral DAILY   ??? multivitamin, tx-iron-ca-min (THERA-M w/ IRON) tablet 1 Tab  1 Tab Oral DAILY   ??? pantoprazole (PROTONIX) tablet 40 mg  40 mg Oral QHS   ??? escitalopram oxalate (LEXAPRO) tablet 20 mg  20 mg Oral QHS   ??? rivaroxaban (XARELTO) tablet 15 mg  15 mg Oral BID WITH MEALS     No Known Allergies    Visit Vitals   ??? BP 97/62 (BP 1 Location: Right arm, BP Patient Position: At rest)    ??? Pulse 61   ??? Temp 98.4 ??F (36.9 ??C)   ??? Resp 16   ??? Ht '5\' 3"'$  (1.6 m)   ??? Wt 73.7 kg (162 lb 6.4 oz)   ??? SpO2 98%   ??? BMI 28.77 kg/m2     Physical Exam   Constitutional: She is oriented to person, place, and time. She appears well-developed and well-nourished.   Cardiovascular: Normal rate.    Pulmonary/Chest: Effort normal.   Neurological: She is oriented to person, place, and time. She has normal strength. Gait normal.   Psychiatric: She has a normal mood and affect. Her behavior is normal.   Very talkative with good eye contact   Vitals reviewed.    Neurologic Exam     Mental Status   Oriented to person, place, and time.   Level of consciousness: alert    Cranial Nerves   Cranial nerves II through XII intact.     Motor Exam   Muscle bulk: normal    Strength   Strength 5/5 throughout.     Sensory Exam   Light touch normal.     Gait, Coordination, and Reflexes     Gait  Gait: normal           Lab Results  Component Value Date/Time   WBC 7.5 03/27/2016 04:02 AM   HGB 10.9 03/27/2016 04:02 AM   HCT 34.5 03/27/2016 04:02 AM   PLATELET 222 03/27/2016 04:02 AM   MCV 79.1 03/27/2016 04:02 AM     Lab Results  Component Value Date/Time   Hemoglobin A1c 6.0 03/27/2016 04:02 AM   Hemoglobin A1c 5.8 12/10/2015 10:43 AM   Hemoglobin A1c 5.9 10/27/2015 08:43 AM  Glucose 87 03/26/2016 01:56 PM   Glucose (POC) 89 03/27/2016 11:54 AM   LDL, calculated 138.4 03/27/2016 04:02 AM   Creatinine (POC) 0.8 02/05/2014 03:01 AM   Creatinine 0.72 03/26/2016 01:56 PM      Lab Results  Component Value Date/Time   ALT (SGPT) 17 03/26/2016 01:56 PM   AST (SGOT) 14 03/26/2016 01:56 PM   Alk. phosphatase 126 03/26/2016 01:56 PM   Bilirubin, total 0.2 03/26/2016 01:56 PM   Albumin 3.5 03/26/2016 01:56 PM   Protein, total 7.1 03/26/2016 01:56 PM   INR 0.9 12/10/2015 10:43 AM   INR (POC) 1.1 03/26/2016 01:53 PM   Prothrombin time 9.7 12/10/2015 10:43 AM   PLATELET 222 03/27/2016 04:02 AM                CT Results (maximum last 3):  Results from  [place] encounter on 03/26/16   CTA CHEST W OR W WO CONT   Narrative INDICATION:  Shortness of breath, assess for pulmonary embolus.    COMPARISON:  12/21/2015.    TECHNIQUE:  Noncontrast images were obtained to localize the pulmonary arteries.    Contiguous axial CT images were then obtained of the chest with 75 mL of  Isovue-370 contrast using the pulmonary angio protocol.    2D post processing was utilized, and coronal and sagittal reformatted images  were obtained.   In addition 3D post processing was utilized and MIP coronal  reconstructed images were obtained.   CT dose reduction was achieved through the use of a standardized protocol  tailored for this examination and automatic exposure control for dose  modulation.    FINDINGS:    No pulmonary embolus is detected.    The thoracic aorta is normal in caliber.    No dissecting aneurysm is seen.    Slight right lower lobe linear stranding is noted.  There is no pericardial effusion or pleural effusion.  Bilateral breast implants are noted.    There are no focal abnormalities detected within the visualized liver, spleen,  pancreas, and adrenal glands.  Degenerative change of the spine is noted.             Impression IMPRESSION:  No pulmonary embolus detected.    No aneurysm detected            CT CODE NEURO HEAD WO CONTRAST   Narrative EXAM:  CT CODE NEURO HEAD WO CONTRAST    INDICATION:   NIH 1, numbness in lip    COMPARISON: None.    CONTRAST:  None.    TECHNIQUE:    Contiguous CT images were obtained of the head without contrast.  Coronal and  sagittal reformatted images were obtained.  CT dose reduction was achieved  through the use of a standardized protocol tailored for this examination and  automatic exposure control for dose modulation.    FINDINGS:    Slight atrophic change and periventricular and basal ganglionic hypoattenuation  suggesting microvascular ischemic change of indeterminate age are seen.    No acute hemorrhage, mass effect, mass  lesion, midline shift or extra axial  collection is evident.    The basilar cisterns are open.   No definite acute infarct is identified.    The visualized paranasal sinuses and mastoid air cells are clear.         Impression IMPRESSION:    Periventricular hypoattenuation suggesting microvascular ischemic change of  indeterminate age.    No acute intracranial bleed/shift of the midline.  MRI Results (maximum last 3):    Results from [place] encounter on 03/26/16   MRI BRAIN WO CONT   Narrative INDICATION:  TIA (lip numbness, NIH stroke scale of 1)    EXAM: MRI BRAIN WITHOUT CONTRAST.    COMPARISON: CT head 03/26/2016 .     CONTRAST: None administered.     PULSE SEQUENCES: Sagittal T1-weighted images, axial T1-weighted images, coronal  T1-weighted images , axial FLAIR and T2 star weighted images, axial diffusion  weighted echo planar images, axial ADC mapping.    FINDINGS: Periventricular foci of T2 and FLAIR hyperintensity are numerous and  more prominent than expected for age. Generalized prominence of cerebral sulci  and ventricles is mildly increased, upper normal for age.    The brain parenchyma and ventricular system otherwise are unremarkable in  appearance for age. No parenchymal mass or hemorrhage and no shift of midline  structures. No extra-axial fluid or blood collection. No additional signal  abnormality. The craniocervical junction is normal for age, as are other midline  structures. Large  vessels appear patent on spin-echo imaging. There is no acute  or subacute ischemia on diffusion weighting. .          Impression IMPRESSION:  1. Periventricular foci of T2 and FLAIR hyperintensity which would be consistent  with microvascular ischemic change but is out of proportion to the patient's  age.. Correlate clinically for other potential causes.  2. No acute or subacute ischemia or other acute intracranial abnormality.      Results from [place] encounter on 03/31/12    MRI SHOULDER LT WO CONT   Narrative **Final Report**      ICD Codes / Adm.Diagnosis: 727.61  786.50 / Complete rupture of rotator cu    Examination:  MR SHOULDER WO CON LT  - [id number] - Mar 31 2012  3:40PM  Accession No:  [id number]  Reason:  pain      REPORT:  EXAM:  MR SHOULDER WO CON LT    INDICATION: Fell on June 18 with left shoulder pain.    COMPARISON: None    TECHNIQUE: Axial proton density fat-saturated; oblique coronal T1, T2   fat-saturated, and proton density fat-saturated; and oblique sagittal T2   fat-saturated MRI of the left shoulder.    CONTRAST: None.    FINDINGS: A.C. joint: Normal for age. Anterior acromion process type: 2.   There is a small subacromial spur.    Bone marrow: There is a transverse fracture of the surgical neck of the   humerus with surrounding bone marrow edema the structures toward the greater   tuberosity. There is minimal anterior angulation at the fracture site. There   is no continuation to the articular surface of the humeral head.    Joint fluid: No joint effusion.    Rotator cuff tendons: There is high-grade partial-thickness tearing of the   anterior aspect of the supraspinatus tendon on series 6 image 13. Only   several fibers remain intact and there is no significant retraction. There   is a small partial-thickness articular sided tear in the insertion of the   infraspinatus measuring 7 mm in the AP direction on series 7 image 15 x 5 mm   transverse on series 5 image 7. Mild tendinosis is present in the cranial   fibers of the subscapularis. The remainder of the cuff is intact.    Biceps tendon: Intact and located within the bicipital groove.    Muscles: Mild edema is present  along the inferior aspect of the muscle belly   of the supraspinatus on series 7 image 1.    Glenoid labrum: Diffuse tearing is present throughout the labrum, which may   be a least partially degenerative in nature.    Joint capsule: The middle glenohumeral ligament is mildly enlarged with    increased signal on series 3 image 9 may related to partial tearing.   Synovitis is seen both in the rotator interval and in the axillary pouch.    Glenohumeral articular cartilage: Intact. No focal osteochondral lesion.    Soft tissue mass: None.       IMPRESSION:  1. Subacute transverse fracture of the surgical neck of the humerus with   mild anterior angulation.  2. High-grade partial tearing of the anterior supraspinatus tendon. Mild   edema seen along the inferior aspect of the supraspinatus more medially.  3. Partial-thickness articular sided insertional tear of the infraspinatus   tendon.  4. Diffuse degenerative changes throughout the labrum.  5. Enlargement and high signal within the middle glenohumeral ligament   likely related to partial tearing.  6. Synovitis in the rotator interval and axillary pouch.           Signing/Reading Doctor: Bernardo Heater ([id number])    Approved: Bernardo Heater ([id number])  Apr 03 2012  8:08AM                                    Results from [place] encounter on 07/07/11   MRI FOOT RT WO CONT   Narrative **Final Report**      ICD Codes / Adm.Diagnosis: 729.5  794.09 / Pain in limb    Examination:  MR FOOT WO CON RT  - [id number] - Jul 07 2011  1:39PM  Accession No:  [id number]  Reason:  pain, FOOT REGION: Fore      REPORT:  EXAM:  MR FOOT WO CON RT    INDICATION:  Pain and palpable lump in the dorsal, medial midfoot.    COMPARISON: None    TECHNIQUE: Axial, coronal, and sagittal noncontrast MRI of the right   forefoot in the T1, T2, and inversion-recovery pulse sequences with and   without fat saturation .    FINDINGS: Marker was placed on the skin dorsal to the Lisfranc joint.  Deep   to the marker, there is subtle amorphous hyperintense T2 signal surrounding   the dorsalis pedis neurovascular bundle.  Mild second TMT joint   osteoarthritis.  No erosion or joint effusion in the midfoot.    Mild first MTP and MTS joint osteoarthritis.  Small reactive joint effusion.    Hyperintense T2 signal is present between the first and second metatarsal   heads, most compatible with bursitis.    Bone marrow signal is within normal limits.  No acute fracture, dislocation,   or osteonecrosis.  Tendons are intact.  Muscle signal is within normal   limits.  Lisfranc ligament is intact.       IMPRESSION:  1.  Nonspecific soft tissue signal dorsal to the Lisfranc joint.    Differential diagnosis includes soft tissue contusion, dorsalis pedis   thrombophlebitis, and less likely capsulitis.  No measurable mass.  2.  Mild osteoarthritis.  3.  No fracture or osteonecrosis.           Signing/Reading Doctor: Marilynne Drivers [phone])    Approved: Marilynne Drivers [phone])  Jul 07 2011  2:04PM                                      VAS/US/Carotid Doppler Results (maximum last 3):    Results from [place] encounter on 03/19/16   DUPLEX LOWER EXT VENOUS BILAT    PET Results (maximum last 3):   No results found for this or any previous visit.        Assessment and Plan        60 year old woman on Xarelto for PE/DVT who was having mild symptoms concerning for possible TIA.  Fortunately, the symptoms resolved.  The facial asymmetry is questionable at best.  I reviewed the MRI and there are bilateral T2 changes possibly nonspecific in the setting of ischemic disease.  Would recommend continued follow up on that as an outpatient.  No restricted diffusion to suggest acute stroke.  I think is reasonable for her to remain on a baby aspirin daily.  I discussed the bleeding risks associated with combination antiplatelet and Xarelto.  Once she is medically cleared I anticipate she will be discharged and she can follow-up with Dr. Norval Gable in the outpatient clinic.    [place], DO  NEUROLOGIST  Diplomate ABPN  03/27/2016

## 2016-03-27 NOTE — Discharge Summary (Signed)
Discharge Summary by Karsten RoIbe, Emika Tiano C, MD at 03/27/16 1427                Author: Karsten RoIbe, Ollie Delano C, MD  Service: Internal Medicine  Author Type: Physician       Filed: 03/27/16 1434  Date of Service: 03/27/16 1427  Status: Signed          Editor: Karsten RoIbe, Myonna Chisom C, MD (Physician)                        DISCHARGE SUMMARY            PATIENT ID: Lindsay [name]   MRN: [id number]     DATE OF BIRTH: 12/28/1955    AGE:  60 y.o.   DATE OF ADMISSION: 03/26/2016  1:28 PM      DATE OF DISCHARGE:  03/27/2016   PRIMARY CARE PROVIDER:  Clarene CritchleyFrancesca L Glynn, MD          DISCHARGING PHYSICIAN:  Karsten RoIkenna C Avary Eichenberger, MD     To contact physician, please call [phone] and ask  the operator to page or ask to be transferred the Adult Hospitalist Department.         Discharge Diagnosis:      Patient Active Problem List           Diagnosis  Date Noted         ?  Paresthesia of lower lip  03/26/2016     ?  RLS (restless legs syndrome)  03/16/2016     ?  Primary osteoarthritis of right knee  12/16/2015     ?  Primary osteoarthritis of left knee  10/28/2015     ?  Abnormal brain scan       ?  Anxiety  12/28/2012             Class: Chronic         ?  Dyslipidemia, goal LDL below 100  12/27/2012     ?  Pulmonary embolism (HCC)  12/26/2012     ?  Chest pain  08/10/2011     ?  Headache  06/05/2009         ?  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 Reports that she had mild shortness of breath associated with that, got concerned, and decided to come to the hospital today. While coming to the hospital, she had some right lip numbness, and    she felt as if "somebody was taking a needle with the thread, threading her lower lip, and then pulling on the  thread." The patient reports the feeling lasted for a few seconds. She was in the car, looked in the mirror, and felt that she had mild subjective  droop on the right side of her face, . The patient reports that she had    an unprovoked DVT in 2014 on the left lower lobe, was put on Xarelto for a few months and then was taken off.       Hospital Course:    1. Transient ischemic attack. Complete work up done and reviewed by Neurology.  Start the patient on aspirin    and statin. F/u withNeurology out patient.   2. Chest pain. Appears to be musculoskeletal. CTA of the chest is negative for pulmonary embolism. Will continue Xarelto. Negetive troponins. And echocardiogram   3. Anemia, chronic. Will monitor. No signs of bleeding.   4. Restless leg syndrome. Will continue home medications. Continue to monitor.    5. History of pulmonary embolism and deep venous thrombosis. Continue Xarelto      Physical Exam on Discharge:     Visit Vitals         ?  BP  110/66 (BP 1 Location: Right arm, BP Patient Position: At rest;Sitting)     ?  Pulse  80     ?  Temp  98.4 ??F (36.9 ??C)     ?  Resp  10     ?  Ht  5\' 3"  (1.6 m)     ?  Wt  73.7 kg (162 lb 6.4 oz)     ?  SpO2  98%         ?  BMI  28.77 kg/m2              General:   Alert, cooperative, no distress, appears stated age.        Lungs:    Clear to auscultation bilaterally.        Chest wall:   No tenderness or deformity.        Heart:   Regular rate and rhythm, S1, S2 normal, no murmur, click, rub or gallop.        Abdomen:    Soft, non-tender. Bowel sounds normal. No masses,  No organomegaly.     Extremities:  Extremities normal, atraumatic, no cyanosis or edema.     Pulses:  2+ and symmetric all extremities.     Skin:  Skin color, texture, turgor normal. No rashes or lesions     Neurologic:  CNII-XII intact.            Pertinent Results:       Recent Labs            03/26/16    1356     BUN   11     NA   135*        CO2   29          Recent Labs            03/27/16    0402      WBC   7.5     RBC   4.36     HCT   34.5*     MCV   79.1*     MCH   25.0*     MCHC   31.6        RDW   14.4           EKG:    CXR:      Tests pending at discharge:       DISCHARGE MEDICATIONS:      Current Discharge Medication List              START taking these medications          Details        aspirin 81 mg chewable tablet  Take 1 Tab by mouth daily.   Qty: 30 Tab, Refills:  0               pravastatin (PRAVACHOL)  40 mg tablet  Take 1 Tab by mouth nightly.   Qty: 30 Tab, Refills:  0                     CONTINUE these medications which have NOT CHANGED          Details        rivaroxaban (XARELTO) 20 mg tab tablet  Take 20 mg by mouth daily (with dinner).               HYDROcodone-acetaminophen (NORCO) 7.5-325 mg per tablet  Take 1 Tab by mouth every eight (8) hours as needed for Pain.               !! diazePAM (VALIUM) 10 mg tablet  Take 10 mg by mouth nightly. (Takes 5 mg qam and 10 mg qhs)               gabapentin enacarbil (HORIZANT) 300 mg TbER  Take 300 Tabs by mouth daily.   Qty: 30 Tab, Refills:  5               rOPINIRole (REQUIP) 1 mg tablet  Take 2 tabs by mouth 1-3 hours before bedtime   Qty: 60 Tab, Refills:  5          Comments: Needs f/u appointment in clinic for any additional refills.               !! diazePAM (VALIUM) 5 mg tablet  Take 5 mg by mouth daily. (Takes 5 mg qam and 10 mg qhs)               ergocalciferol (ERGOCALCIFEROL) 50,000 unit capsule  Take 1 Cap by mouth every seven (7) days.   Qty: 12 Cap, Refills:  1          Associated Diagnoses: Vitamin deficiency               escitalopram oxalate (LEXAPRO) 20 mg tablet  Take 1 Tab by mouth daily.   Qty: 30 Tab, Refills:  5               omeprazole (PRILOSEC) 20 mg capsule  Take 20 mg by mouth nightly.               !! - Potential duplicate medications found. Please discuss with provider.                  Condition at discharge:  Afrebrile   Ambulating   Eating, Drinking, Voiding   Stable      FOLLOW UP APPOINTMENTS:        Follow-up  Information        Follow up With  Details  Comments  Contact Info             Clarene CritchleyFrancesca L Glynn, MD  In 1 week    [address]   [address]   [place] [id number]   [phone]                Jodi MourningMary M Ransom, MD  In 1 week    [address]   [address]   [place] [id number]   [phone]                    Disposition:  Home with family         Total discharge preparation time was 35  minutes.  Jettie Booze, MD MPH   Hospitalist.    [facility]    Pager [phone]    Office [phone]

## 2016-03-27 NOTE — Consults (Signed)
Neuro consult done

## 2016-03-27 NOTE — Progress Notes (Signed)
IP PT consult received and appreciated.  Pt unavailable for therapy evaluation at this time; Dannielle Burnereasa, RN reports pt is off unit for MRI.  Therapy to follow up as able and appropriate.  Thanks!      Edson SnowballBrittany B Thomas PT, DPT

## 2016-03-27 NOTE — Progress Notes (Signed)
Occupational Therapy neurological EVALUATION with discharge  Patient: Lindsay [name] (60 y.o. female)  Date: 03/27/2016  Primary Diagnosis: TIA (transient ischemic attack)        Precautions:     ASSESSMENT:  Based on the objective data described below, the patient presents with overall IND with all self-care tasks, functional transfers and mobility. Pt admitted with R lip numbness, chest pain and h/o B DVTs (h/o B TKR). CT negative for acute PE. Pt strength, coordination, vision, sensation and cognition all intact. Pt scored 66/66 on Fugl meyer assessment. Pt does report episodes of word finding difficulties/memory/falls and reports she sees OP neurologist. Pt and family educated on BEFAST; both verbalized excellent understanding; also provided education on types of stroke and s/s. Pt with no acute OT needs at this time and discussed with RN and PT.    Further skilled acute occupational therapy is not indicated at this time.  Discharge Recommendations: None  Further Equipment Recommendations for Discharge: none     SUBJECTIVE:   Patient stated ???I'm so clumsy.???    OBJECTIVE DATA SUMMARY:   HISTORY:   Past Medical History:   Diagnosis Date   ??? Abnormal brain scan    ??? Arthritis    ??? Chronic pain    ??? Depression    ??? Falls frequently    ??? Fracture of left ankle    ??? Headache 2011   ??? Ill-defined condition 09/15/2015    FX R HAND   ??? Other and unspecified hyperlipidemia 12/27/2012   ??? PE (pulmonary embolism) 12/2012   ??? RLS (restless legs syndrome) 03/16/2016    Dr Scarlett Presto   ??? Torn rotator cuff     right     Past Surgical History:   Procedure Laterality Date   ??? BREAST SURGERY PROCEDURE UNLISTED  2002    AUGMENTATION   ??? HX APPENDECTOMY     ??? HX CESAREAN SECTION      x4   ??? HX GI      COLONOSCOPY   ??? 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 HX ORTHOPAEDIC  08/2004    rotater cuff r. shoulder   ??? HX ORTHOPAEDIC      left ankle fracture treated with air cast   ??? HX ORTHOPAEDIC  6/13    left shoulder fxt and dislocation    ??? HX OTHER SURGICAL  2010    left foot torn tendon   ??? HX TUBAL LIGATION         Prior Level of Function/Home Situation: Pt IND, lives with husband  Expanded or extensive additional review of patient history: L TKR ~5 months ago, R TKR ~3 months ago; B DVT and PE; sees neurologist for h/o abnormal MRI per pt report (MS?), episodes of word finding difficulties and impaired memory and falls.    Home Situation  Home Environment: Private residence  # Steps to Enter: 5  Rails to Enter: Yes  Hand Rails : Right  One/Two Story Residence: Two story, live on 1st floor  Living Alone: No  Support Systems: Family member(s), Spouse/Significant Other/Partner  Patient Expects to be Discharged to:: Private residence  Current DME Used/Available at Home: Cane, straight, Environmental consultant, rolling, Crutches, Grab bars, Shower chair, Raised toilet seat  Tub or Shower Type: Shower (sponge baths downstairs)    Right hand dominant     Left hand dominant    EXAMINATION OF PERFORMANCE DEFICITS:  Cognitive/Behavioral Status:  Neurologic State: Alert;Appropriate for age  Orientation Level: Oriented  X4  Cognition: Appropriate for age attention/concentration;Appropriate decision making;Appropriate safety awareness;Follows commands  Perception: Appears intact  Perseveration: No perseveration noted  Safety/Judgement: Awareness of environment;Fall prevention    Skin: appears intact    Edema: none noted in BUEs    Hearing:  Auditory  Auditory Impairment: None    Vision/Perceptual:    Tracking: Able to track stimulus in all quadrants w/o difficulty                 Diplopia: No    Acuity: Able to read clock/calendar on wall without difficulty;Able to read employee name badge without difficulty         Range of Motion:    AROM: Within functional limits  PROM: Within functional limits                      Strength:    Strength: Within functional limits                Coordination:  Coordination: Within functional limits   Fine Motor Skills-Upper: Left Intact;Right Intact    Gross Motor Skills-Upper: Left Intact;Right Intact    Tone & Sensation:    Tone: Normal  Sensation: Intact                      Balance:  Sitting: Intact  Standing: Intact;Without support    Functional Mobility and Transfers for ADLs:  Bed Mobility:  Rolling: Independent  Supine to Sit: Independent  Sit to Supine: Independent    Transfers:  Sit to Stand: Independent       ADL Assessment:  Feeding: Independent    Oral Facial Hygiene/Grooming: Independent    Bathing: Independent    Upper Body Dressing: Independent    Lower Body Dressing: Independent    Toileting: Independent                ADL Intervention and task modifications:                                     Cognitive Retraining  Safety/Judgement: Awareness of environment;Fall prevention         Functional Measure:     Daryel November 66/66    Barthel Index:    Bathing: 5  Bladder: 10  Bowels: 10  Grooming: 5  Dressing: 10  Feeding: 10  Mobility: 15  Stairs: 10  Toilet Use: 10  Transfer (Bed to Chair and Back): 15  Total: 100       Barthel and G-code impairment scale:  Percentage of impairment CH  0% CI  1-19% CJ  20-39% CK  40-59% CL  60-79% CM  80-99% CN  100%   Barthel Score 0-100 100 99-80 79-60 59-40 20-39 1-19   0   Barthel Score 0-20 20 17-19 13-16 9-12 5-8 1-4 0      The Barthel ADL Index: Guidelines  1. The index should be used as a record of what a patient does, not as a record of what a patient could do.  2. The main aim is to establish degree of independence from any help, physical or verbal, however minor and for whatever reason.  3. The need for supervision renders the patient not independent.  4. A patient's performance should be established using the best available evidence. Asking the patient, friends/relatives and nurses are the usual sources, but direct observation and common  sense are also important. However direct testing is not needed.   5. Usually the patient's performance over the preceding 24-48 hours is important, but occasionally longer periods will be relevant.  6. Middle categories imply that the patient supplies over 50 per cent of the effort.  7. Use of aids to be independent is allowed.    Clarisa KindredMahoney, F.l., Barthel, D.W. [phone](1965). Functional evaluation: the Barthel Index. Md State Med J (14)2.  Zenaida NieceVan der GuttenbergPutten, J.J.M.F, CopelandHobart, Ian MalkinJ.C., Margret ChanceFreeman, J.A., [place] Hillshompson, [place].J. (1999). Measuring the change indisability after inpatient rehabilitation; comparison of the responsiveness of the Barthel Index and Functional Independence Measure. Journal of Neurology, Neurosurgery, and Psychiatry, 66(4), [phone].  Dawson BillsVan Exel, N.J.A, Scholte op RadnorReimer,  W.J.M, & Koopmanschap, M.A. (2004.) Assessment of post-stroke quality of life in cost-effectiveness studies: The usefulness of the Barthel Index and the EuroQoL-5D. Quality of Life Research, 13, [phone]         G codes:  In compliance with CMS???s Claims Based Outcome Reporting, the following G-code set was chosen for this patient based on their primary functional limitation being treated:    The outcome measure chosen to determine the severity of the functional limitation was the Barthel index with a score of 100/100 which was correlated with the impairment scale.    ? Self Care:    [phone] [id number] - CURRENT STATUS: CH - 0% impaired, limited or restricted   [phone] - GOAL STATUS: CH - 0% impaired, limited or restricted   [id number] - D/C STATUS:  CH - 0% impaired, limited or restricted        Occupational Therapy Evaluation Charge Determination   History Examination Decision-Making   MEDIUM Complexity : Expanded review of history including physical, cognitive and psychosocial  history  LOW Complexity : 1-3 performance deficits relating to physical, cognitive , or psychosocial skils that result in activity limitations and / or participation restrictions  LOW Complexity : No comorbidities that affect functional and no verbal or  physical assistance needed to complete eval tasks       Based on the above components, the patient evaluation is determined to be of the following complexity level: LOW     Activity Tolerance:   VSS    Please refer to the flowsheet for vital signs taken during this treatment.  After treatment:     Patient left in no apparent distress sitting up in chair    Patient left in no apparent distress in bed    Call bell left within reach    Nursing notified    Caregiver present    Bed alarm activated    COMMUNICATION/EDUCATION:   Findings and recommendations were discussed with: Physical Therapist and Registered Nurse    Patient was educated regarding Her deficit(s) of none as this relates to Her diagnosis of r/o TIA.  She demonstrated Excellent understanding as evidenced by verbalizing understanding, participating in therapy.    Patient and/or family was verbally educated on the BE FAST acronym for signs/symptoms of CVA and TIA. BE FAST was written on patient's communication board  for visual education and reinforcement.  All questions answered with patient indicating excellent understanding.           Home safety education was provided and the patient/caregiver indicated understanding.        Patient/family have participated as able and agree with findings and recommendations.        Patient is unable to participate in plan of care at this time.    Thank you for  this referral.  Elray Mcgregor, OT  Time Calculation: 25 mins

## 2016-03-27 NOTE — Discharge Summary (Signed)
DISCHARGE SUMMARY        PATIENT ID: Lindsay [name]  MRN: [id number]   DATE OF BIRTH: 02/08/1956   AGE: 60 y.o.  DATE OF ADMISSION: 03/26/2016  1:28 PM    DATE OF DISCHARGE: 03/27/2016  PRIMARY CARE PROVIDER: Clarene CritchleyFrancesca L Glynn, MD       DISCHARGING PHYSICIAN: Karsten RoIkenna C Zaidin Blyden, MD    To contact physician, please call [phone] and ask the operator to page or ask to be transferred the Adult Hospitalist Department.      Discharge Diagnosis:   Patient Active Problem List    Diagnosis Date Noted   ??? Paresthesia of lower lip 03/26/2016   ??? RLS (restless legs syndrome) 03/16/2016   ??? Primary osteoarthritis of right knee 12/16/2015   ??? Primary osteoarthritis of left knee 10/28/2015   ??? Abnormal brain scan    ??? Anxiety 12/28/2012     Class: Chronic   ??? Dyslipidemia, goal LDL below 100 12/27/2012   ??? Pulmonary embolism (HCC) 12/26/2012   ??? Chest pain 08/10/2011   ??? Headache 06/05/2009   ??? 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 Reports that she had mild shortness of breath associated with that, got concerned, and decided to come to the hospital today. While coming to the hospital, she had some right lip numbness, and   she felt as if "somebody was taking a needle with the thread, threading her lower lip, and then pulling on the thread." The patient reports the feeling lasted for a few seconds. She was in the car, looked in the mirror, and felt that she had mild subjective droop on the right side of her face, . The patient reports that she had   an unprovoked DVT in 2014 on the left lower lobe, was put on Xarelto for a few months and then was taken off.     Hospital Course:    1. Transient ischemic attack. Complete work up done and reviewed by Neurology.  Start the patient on aspirin   and statin. F/u withNeurology out patient.  2. Chest pain. Appears to be musculoskeletal. CTA of the chest is negative for pulmonary embolism. Will continue Xarelto. Negetive troponins. And echocardiogram  3. Anemia, chronic. Will monitor. No signs of bleeding.  4. Restless leg syndrome. Will continue home medications. Continue to monitor.   5. History of pulmonary embolism and deep venous thrombosis. Continue Xarelto    Physical Exam on Discharge:  Visit Vitals   ??? BP 110/66 (BP 1 Location: Right arm, BP Patient Position: At rest;Sitting)   ??? Pulse 80   ??? Temp 98.4 ??F (36.9 ??C)   ??? Resp 10   ??? Ht 5\' 3"  (1.6 m)   ??? Wt 73.7 kg (162 lb 6.4 oz)   ??? SpO2 98%   ??? BMI 28.77 kg/m2       General:  Alert, cooperative, no distress, appears stated age.   Lungs:   Clear to auscultation bilaterally.   Chest wall:  No tenderness or deformity.   Heart:  Regular rate and rhythm, S1, S2 normal, no murmur, click, rub or gallop.   Abdomen:   Soft, non-tender. Bowel sounds normal. No masses,  No  organomegaly.   Extremities: Extremities normal, atraumatic, no cyanosis or edema.   Pulses: 2+ and symmetric all extremities.   Skin: Skin color, texture, turgor normal. No rashes or lesions   Neurologic: CNII-XII intact.        Pertinent Results:    Recent Labs      03/26/16   1356   BUN  11   NA  135*   CO2  29     Recent Labs      03/27/16   0402   WBC  7.5   RBC  4.36   HCT  34.5*   MCV  79.1*   MCH  25.0*   MCHC  31.6   RDW  14.4       EKG:   CXR:    Tests pending at discharge:     DISCHARGE MEDICATIONS:   Current Discharge Medication List      START taking these medications    Details   aspirin 81 mg chewable tablet Take 1 Tab by mouth daily.  Qty: 30 Tab, Refills: 0      pravastatin (PRAVACHOL) 40 mg tablet Take 1 Tab by mouth nightly.  Qty: 30 Tab, Refills: 0         CONTINUE these medications which have NOT CHANGED     Details   rivaroxaban (XARELTO) 20 mg tab tablet Take 20 mg by mouth daily (with dinner).      HYDROcodone-acetaminophen (NORCO) 7.5-325 mg per tablet Take 1 Tab by mouth every eight (8) hours as needed for Pain.      !! diazePAM (VALIUM) 10 mg tablet Take 10 mg by mouth nightly. (Takes 5 mg qam and 10 mg qhs)      gabapentin enacarbil (HORIZANT) 300 mg TbER Take 300 Tabs by mouth daily.  Qty: 30 Tab, Refills: 5      rOPINIRole (REQUIP) 1 mg tablet Take 2 tabs by mouth 1-3 hours before bedtime  Qty: 60 Tab, Refills: 5    Comments: Needs f/u appointment in clinic for any additional refills.      !! diazePAM (VALIUM) 5 mg tablet Take 5 mg by mouth daily. (Takes 5 mg qam and 10 mg qhs)      ergocalciferol (ERGOCALCIFEROL) 50,000 unit capsule Take 1 Cap by mouth every seven (7) days.  Qty: 12 Cap, Refills: 1    Associated Diagnoses: Vitamin deficiency      escitalopram oxalate (LEXAPRO) 20 mg tablet Take 1 Tab by mouth daily.  Qty: 30 Tab, Refills: 5      omeprazole (PRILOSEC) 20 mg capsule Take 20 mg by mouth nightly.       !! - Potential duplicate medications found. Please discuss with provider.          Condition at discharge:  Afrebrile  Ambulating  Eating, Drinking, Voiding  Stable    FOLLOW UP APPOINTMENTS:    Follow-up Information     Follow up With Details Comments Contact Info    Clarene CritchleyFrancesca L Glynn, MD In 1 week  [address] [facility] [address]  [address]  [place] [id number]  [phone]      Jodi MourningMary M Ransom, MD In 1 week  [address]  [address]  [place] [id number]  [phone]             Disposition:  Home with family      Total discharge preparation time was 35  minutes.    Karsten RoIkenna C. Wonda Goodgame, MD MPH  Hospitalist.  [facility]   Pager [phone]   Office [phone]

## 2016-03-27 NOTE — Other (Addendum)
Stroke Education documented in Patient Education: YES  Core Measures Documented in [facility]:  Risk Factors: YES  Warning signs of stroke: YES  When to Activate 911: YES  Medication Education for Risk Factors: YES  Smoking cessation if applicable:    Written Education Given:  YES    Discharge NIH Completed: yes  Score: 0    BRAINS: yes

## 2016-03-27 NOTE — Progress Notes (Signed)
Problem: Falls - Risk of  Goal: *Absence of Falls  Document Schmid Fall Risk and appropriate interventions in the flowsheet.  Outcome: Progressing Towards Goal  Fall Risk Interventions:              Medication Interventions: Patient to call before getting OOB

## 2016-03-27 NOTE — Progress Notes (Signed)
physical Therapy EVALUATION/DISCHARGE  Patient: Lindsay [name] ([age] y.o. female)  Date: 03/27/2016  Primary Diagnosis: TIA (transient ischemic attack)          ASSESSMENT : Pt cleared for evaluation per Dannielle Burnereasa, RN.  Patient in bed, pleasant and agreeable with OT completing evaluation.     Based on the objective data described below, the patient presents with independent mobility.  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  This admission was tp r/o PE secondary to patient experiencing chest pain/fullness.  Chest CTA reveal no PE.  Upon evaluation, patient with no complaints.  Patient amb independently without device and negotiate 14 stairs without difficulty.  Patient reportedly feels like her steps are "heavy" when descending stairs but attributes to her R TKA recovery.  Patient with 54/56 Berg score, indicating low future fall risk.  Anticipate patient return home without need for home health services once medically stable.    Skilled physical therapy is not indicated at this time.     PLAN :  Discharge Recommendations: Home  Further Equipment Recommendations for Discharge: none     SUBJECTIVE:   Patient stated ???Thank you for coming to see me.???    OBJECTIVE DATA SUMMARY:   HISTORY:    Past Medical History:   Diagnosis Date   ??? Abnormal brain scan    ??? Arthritis    ??? Chronic pain    ??? Depression    ??? Falls frequently    ??? Fracture of left ankle    ??? Headache 2011   ??? Ill-defined condition 09/15/2015    FX R HAND   ??? Other and unspecified hyperlipidemia 12/27/2012   ??? PE (pulmonary embolism) 12/2012   ??? RLS (restless legs syndrome) 03/16/2016    Dr Scarlett PrestoMary Ransom   ??? Torn rotator cuff     right     Past Surgical History:   Procedure Laterality Date   ??? BREAST SURGERY PROCEDURE UNLISTED  2002    AUGMENTATION   ??? HX APPENDECTOMY     ??? HX CESAREAN SECTION      x4   ??? HX GI      COLONOSCOPY   ??? 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 HX ORTHOPAEDIC  08/2004     rotater cuff r. shoulder   ??? HX ORTHOPAEDIC      left ankle fracture treated with air cast   ??? HX ORTHOPAEDIC  6/13    left shoulder fxt and dislocation   ??? HX OTHER SURGICAL  2010    left foot torn tendon   ??? HX TUBAL LIGATION       Prior Level of Function/Home Situation: independent without devices  Personal factors and/or comorbidities impacting plan of care:   Home Situation  Home Environment: Private residence  # Steps to Enter: 5  Rails to Enter: Yes  Hand Rails : Right  One/Two Story Residence: Two story, live on 1st floor  Living Alone: No  Support Systems: Family member(s), Spouse/Significant Other/Partner  Patient Expects to be Discharged to:: Private residence  Current DME Used/Available at Home: Cane, straight, Environmental consultantWalker, rolling, Crutches, Grab bars, Shower chair, Raised toilet seat  Tub or Shower Type: Shower (sponge bathes downstairs)    EXAMINATION/PRESENTATION/DECISION MAKING:   Critical Behavior:  Neurologic State: Alert, Appropriate for age  Orientation Level: Oriented X4  Cognition: Appropriate for age attention/concentration, Appropriate decision making, Appropriate safety awareness, Follows commands  Safety/Judgement: Awareness of environment, Fall prevention     Hearing:  Auditory  Auditory Impairment: None     Skin:  Intact      Range Of Motion:  AROM: Within functional limits  PROM: Within functional limits           Strength:    Strength: Within functional limits        Tone & Sensation:   Tone: Normal  Sensation: Intact         Coordination:  Coordination: Within functional limits      Functional Mobility:  Bed Mobility:  Rolling: Independent  Supine to Sit: Independent  Sit to Supine: Independent     Transfers:  Sit to Stand: Independent  Stand to Sit: Independent  Stand Pivot Transfers: Independent           Balance:   Sitting: Intact  Standing: Intact;Without support     Ambulation/Gait Training:  Distance (ft): 300 Feet (ft)  Assistive Device: Gait belt   Ambulation - Level of Assistance: Independent  Base of Support: Widened  Speed/Cadence: Fluctuations     Stairs:  Number of Stairs Trained: 14  Stairs - Level of Assistance: Modified independent   Ascending: reciprocal without HR   Descending: reciprocal with R handrail       Functional Measure:  Berg Balance Test:    Sitting to Standing: 4  Standing Unsupported: 4  Sitting with Back Unsupported: 4  Standing to Sitting: 4  Transfers: 4  Standing Unsupported with Eyes Closed: 3  Standing Unsupported with Feet Together: 4  Reach Forward with Outstretched Arm: 4  Pick Up Object: 4  Turn to Look Over Shoulders: 4  Turn 360 Degrees: 4  Alternate Foot on Step/Stool: 4  Standing Unsupported One Foot in Front: 4  Stand on One Leg: 3  Total: 54         56=Maximum possible score;   0-20=High fall risk  21-40=Moderate fall risk   41-56=Low fall risk     Berg Balance Test and G-code impairment scale:  Percentage of Impairment CH    0%   CI    1-19% CJ    20-39% CK    40-59% CL    60-79% CM    80-99% CN     100%   Berg   Score 0-56 56 45-55 34-44 23-33 12-22 1-11 0         G codes:  In compliance with CMS???s Claims Based Outcome Reporting, the following G-code set was chosen for this patient based on their primary functional limitation being treated:    The outcome measure chosen to determine the severity of the functional limitation was the [place] with a score of 54/56 which was correlated with the impairment scale.    ? Mobility - Walking and Moving Around:    I9518 - CURRENT STATUS: CI - 1%-19% impaired, limited or restricted   A4166 - GOAL STATUS: CI - 1%-19% impaired, limited or restricted   A6301 - D/C STATUS:  CI - 1%-19% impaired, limited or restricted       Pain:Pain Scale 1: Numeric (0 - 10)  Pain Intensity 1: 2  Pain Location 1: Head     Activity Tolerance:   Good    Please refer to the flowsheet for vital signs taken during this treatment.  After treatment:      Patient left in no apparent distress sitting up in chair      Patient left in no apparent  distress in bed     Call bell left within reach     Nursing notified     Caregiver present     Bed alarm activated    COMMUNICATION/EDUCATION:   Communication/Collaboration:     Fall prevention education was provided and the patient/caregiver indicated understanding.     Patient/family have participated as able and agree with findings and recommendations.     Patient is unable to participate in plan of care at this time.  Findings and recommendations were discussed with: Occupational Therapist and Registered Nurse    Thank you for this referral.  Edson Snowball, PT, DPT   Time Calculation: 16 mins

## 2016-03-27 NOTE — Consults (Signed)
NEUROLOGY  03/27/2016     Consulted by: Jettie Booze, MD        Patient ID:  Lindsay [name]  [id number]  60 y.o.  1956/06/19    Chief Complaint   Patient presents with   ??? Shortness of Breath   ??? Numbness       HPI    Ms. Lindsay [name] is a 60 year old woman currently on Xarelto for PE/DVT.  She came to the hospital because of concerns for worsening chest pain and shortness of breath in the setting of PE.  While on route to the hospital she felt like the bottom right lower lip had a sensation of pulling needle and thread.  This lasted just a few seconds.  Then she became concerned and began looking in the mirror wondering if she had a facial droop.  She thought there might of been an asymmetry to the right side of her mouth.  As a result of this presentation a stroke workup has been pursued.  Currently she feels her baseline as she did on arriving to the emergency room.  A baby aspirin was added to her regimen.  MRI brain was done this morning.  It shows bilateral changes but no abnormal restricted diffusion.    Review of Systems   Neurological: Positive for sensory change.        Questionable right facial droop   All other systems reviewed and are negative.      Past Medical History:   Diagnosis Date   ??? Abnormal brain scan    ??? Arthritis    ??? Chronic pain    ??? Depression    ??? Falls frequently    ??? Fracture of left ankle    ??? Headache 2011   ??? Ill-defined condition 09/15/2015    FX R HAND   ??? Other and unspecified hyperlipidemia 12/27/2012   ??? PE (pulmonary embolism) 12/2012   ??? RLS (restless legs syndrome) 03/16/2016    Dr Roseanne Reno   ??? Torn rotator cuff     right     Family History   Problem Relation Age of Onset   ??? Heart Disease Mother    ??? Heart Disease Father    ??? Cancer Father      thyroid cancer   ??? Arthritis-rheumatoid Sister    ??? No Known Problems Son    ??? No Known Problems Daughter    ??? No Known Problems Daughter    ??? No Known Problems Daughter    ??? Anesth Problems Neg Hx      Social History      Social History   ??? Marital status: MARRIED     Spouse name: N/A   ??? Number of children: N/A   ??? Years of education: N/A     Occupational History   ??? Not on file.     Social History Main Topics   ??? Smoking status: Never Smoker   ??? Smokeless tobacco: Never Used   ??? Alcohol use 8.4 oz/week     14 Cans of beer per week      Comment: 2 MICHELOB ULTRA PER DAY   ??? Drug use: No   ??? Sexual activity: Yes     Partners: Male     Other Topics Concern   ??? Not on file     Social History Narrative     Current Facility-Administered Medications   Medication Dose Route Frequency   ??? diazePAM (VALIUM) tablet 10 mg  10 mg Oral QHS   ??? diazePAM (VALIUM) tablet 5 mg  5 mg Oral DAILY   ??? .PHARMACY TO SUBSTITUTE PER PROTOCOL    Per Protocol   ??? HYDROcodone-acetaminophen (NORCO) 7.5-325 mg per tablet 1 Tab  1 Tab Oral Q8H PRN   ??? sodium chloride (NS) flush 5-10 mL  5-10 mL IntraVENous Q8H   ??? sodium chloride (NS) flush 5-10 mL  5-10 mL IntraVENous PRN   ??? acetaminophen (TYLENOL) tablet 650 mg  650 mg Oral Q4H PRN    Or   ??? acetaminophen (TYLENOL) solution 650 mg  650 mg Per NG tube Q4H PRN    Or   ??? acetaminophen (TYLENOL) suppository 650 mg  650 mg Rectal Q4H PRN   ??? 0.9% sodium chloride infusion  50 mL/hr IntraVENous CONTINUOUS   ??? ondansetron (ZOFRAN) injection 4 mg  4 mg IntraVENous Q6H PRN   ??? aspirin chewable tablet 81 mg  81 mg Oral DAILY   ??? pravastatin (PRAVACHOL) tablet 40 mg  40 mg Oral QHS   ??? nitroglycerin (NITROSTAT) tablet 0.4 mg  0.4 mg SubLINGual D4KAJ PRN   ??? folic acid (FOLVITE) tablet 1 mg  1 mg Oral DAILY   ??? thiamine (B-1) tablet 100 mg  100 mg Oral DAILY   ??? multivitamin, tx-iron-ca-min (THERA-M w/ IRON) tablet 1 Tab  1 Tab Oral DAILY   ??? pantoprazole (PROTONIX) tablet 40 mg  40 mg Oral QHS   ??? escitalopram oxalate (LEXAPRO) tablet 20 mg  20 mg Oral QHS   ??? rivaroxaban (XARELTO) tablet 15 mg  15 mg Oral BID WITH MEALS     No Known Allergies    Visit Vitals    ??? BP 97/62 (BP 1 Location: Right arm, BP Patient Position: At rest)   ??? Pulse 61   ??? Temp 98.4 ??F (36.9 ??C)   ??? Resp 16   ??? Ht 5' 3" (1.6 m)   ??? Wt 73.7 kg (162 lb 6.4 oz)   ??? SpO2 98%   ??? BMI 28.77 kg/m2     Physical Exam   Constitutional: She is oriented to person, place, and time. She appears well-developed and well-nourished.   Cardiovascular: Normal rate.    Pulmonary/Chest: Effort normal.   Neurological: She is oriented to person, place, and time. She has normal strength. Gait normal.   Psychiatric: She has a normal mood and affect. Her behavior is normal.   Very talkative with good eye contact   Vitals reviewed.    Neurologic Exam     Mental Status   Oriented to person, place, and time.   Level of consciousness: alert    Cranial Nerves   Cranial nerves II through XII intact.     Motor Exam   Muscle bulk: normal    Strength   Strength 5/5 throughout.     Sensory Exam   Light touch normal.     Gait, Coordination, and Reflexes     Gait  Gait: normal           Lab Results  Component Value Date/Time   WBC 7.5 03/27/2016 04:02 AM   HGB 10.9 03/27/2016 04:02 AM   HCT 34.5 03/27/2016 04:02 AM   PLATELET 222 03/27/2016 04:02 AM   MCV 79.1 03/27/2016 04:02 AM     Lab Results  Component Value Date/Time   Hemoglobin A1c 6.0 03/27/2016 04:02 AM   Hemoglobin A1c 5.8 12/10/2015 10:43 AM   Hemoglobin A1c 5.9 10/27/2015 08:43 AM  Glucose 87 03/26/2016 01:56 PM   Glucose (POC) 89 03/27/2016 11:54 AM   LDL, calculated 138.4 03/27/2016 04:02 AM   Creatinine (POC) 0.8 02/05/2014 03:01 AM   Creatinine 0.72 03/26/2016 01:56 PM      Lab Results  Component Value Date/Time   ALT (SGPT) 17 03/26/2016 01:56 PM   AST (SGOT) 14 03/26/2016 01:56 PM   Alk. phosphatase 126 03/26/2016 01:56 PM   Bilirubin, total 0.2 03/26/2016 01:56 PM   Albumin 3.5 03/26/2016 01:56 PM   Protein, total 7.1 03/26/2016 01:56 PM   INR 0.9 12/10/2015 10:43 AM   INR (POC) 1.1 03/26/2016 01:53 PM   Prothrombin time 9.7 12/10/2015 10:43 AM    PLATELET 222 03/27/2016 04:02 AM                CT Results (maximum last 3):  Results from [place] encounter on 03/26/16   CTA CHEST W OR W WO CONT   Narrative INDICATION:  Shortness of breath, assess for pulmonary embolus.    COMPARISON:  12/21/2015.    TECHNIQUE:  Noncontrast images were obtained to localize the pulmonary arteries.    Contiguous axial CT images were then obtained of the chest with 75 mL of  Isovue-370 contrast using the pulmonary angio protocol.    2D post processing was utilized, and coronal and sagittal reformatted images  were obtained.   In addition 3D post processing was utilized and MIP coronal  reconstructed images were obtained.   CT dose reduction was achieved through the use of a standardized protocol  tailored for this examination and automatic exposure control for dose  modulation.    FINDINGS:    No pulmonary embolus is detected.    The thoracic aorta is normal in caliber.    No dissecting aneurysm is seen.    Slight right lower lobe linear stranding is noted.  There is no pericardial effusion or pleural effusion.  Bilateral breast implants are noted.    There are no focal abnormalities detected within the visualized liver, spleen,  pancreas, and adrenal glands.  Degenerative change of the spine is noted.             Impression IMPRESSION:  No pulmonary embolus detected.    No aneurysm detected            CT CODE NEURO HEAD WO CONTRAST   Narrative EXAM:  CT CODE NEURO HEAD WO CONTRAST    INDICATION:   NIH 1, numbness in lip    COMPARISON: None.    CONTRAST:  None.    TECHNIQUE:    Contiguous CT images were obtained of the head without contrast.  Coronal and  sagittal reformatted images were obtained.  CT dose reduction was achieved  through the use of a standardized protocol tailored for this examination and  automatic exposure control for dose modulation.    FINDINGS:    Slight atrophic change and periventricular and basal ganglionic hypoattenuation   suggesting microvascular ischemic change of indeterminate age are seen.    No acute hemorrhage, mass effect, mass lesion, midline shift or extra axial  collection is evident.    The basilar cisterns are open.   No definite acute infarct is identified.    The visualized paranasal sinuses and mastoid air cells are clear.         Impression IMPRESSION:    Periventricular hypoattenuation suggesting microvascular ischemic change of  indeterminate age.    No acute intracranial bleed/shift of the midline.  MRI Results (maximum last 3):    Results from [place] encounter on 03/26/16   MRI BRAIN WO CONT   Narrative INDICATION:  TIA (lip numbness, NIH stroke scale of 1)    EXAM: MRI BRAIN WITHOUT CONTRAST.    COMPARISON: CT head 03/26/2016 .     CONTRAST: None administered.     PULSE SEQUENCES: Sagittal T1-weighted images, axial T1-weighted images, coronal  T1-weighted images , axial FLAIR and T2 star weighted images, axial diffusion  weighted echo planar images, axial ADC mapping.    FINDINGS: Periventricular foci of T2 and FLAIR hyperintensity are numerous and  more prominent than expected for age. Generalized prominence of cerebral sulci  and ventricles is mildly increased, upper normal for age.    The brain parenchyma and ventricular system otherwise are unremarkable in  appearance for age. No parenchymal mass or hemorrhage and no shift of midline  structures. No extra-axial fluid or blood collection. No additional signal  abnormality. The craniocervical junction is normal for age, as are other midline  structures. Large  vessels appear patent on spin-echo imaging. There is no acute  or subacute ischemia on diffusion weighting. .          Impression IMPRESSION:  1. Periventricular foci of T2 and FLAIR hyperintensity which would be consistent  with microvascular ischemic change but is out of proportion to the patient's  age.. Correlate clinically for other potential causes.   2. No acute or subacute ischemia or other acute intracranial abnormality.      Results from [place] encounter on 03/31/12   MRI SHOULDER LT WO CONT   Narrative **Final Report**      ICD Codes / Adm.Diagnosis: 727.61  786.50 / Complete rupture of rotator cu    Examination:  MR SHOULDER WO CON LT  - [id number] - Mar 31 2012  3:40PM  Accession No:  [id number]  Reason:  pain      REPORT:  EXAM:  MR SHOULDER WO CON LT    INDICATION: Fell on June 18 with left shoulder pain.    COMPARISON: None    TECHNIQUE: Axial proton density fat-saturated; oblique coronal T1, T2   fat-saturated, and proton density fat-saturated; and oblique sagittal T2   fat-saturated MRI of the left shoulder.    CONTRAST: None.    FINDINGS: A.C. joint: Normal for age. Anterior acromion process type: 2.   There is a small subacromial spur.    Bone marrow: There is a transverse fracture of the surgical neck of the   humerus with surrounding bone marrow edema the structures toward the greater   tuberosity. There is minimal anterior angulation at the fracture site. There   is no continuation to the articular surface of the humeral head.    Joint fluid: No joint effusion.    Rotator cuff tendons: There is high-grade partial-thickness tearing of the   anterior aspect of the supraspinatus tendon on series 6 image 13. Only   several fibers remain intact and there is no significant retraction. There   is a small partial-thickness articular sided tear in the insertion of the   infraspinatus measuring 7 mm in the AP direction on series 7 image 15 x 5 mm   transverse on series 5 image 7. Mild tendinosis is present in the cranial   fibers of the subscapularis. The remainder of the cuff is intact.    Biceps tendon: Intact and located within the bicipital groove.    Muscles: Mild edema is present  along the inferior aspect of the muscle belly   of the supraspinatus on series 7 image 1.    Glenoid labrum: Diffuse tearing is present throughout the labrum, which  may   be a least partially degenerative in nature.    Joint capsule: The middle glenohumeral ligament is mildly enlarged with   increased signal on series 3 image 9 may related to partial tearing.   Synovitis is seen both in the rotator interval and in the axillary pouch.    Glenohumeral articular cartilage: Intact. No focal osteochondral lesion.    Soft tissue mass: None.       IMPRESSION:  1. Subacute transverse fracture of the surgical neck of the humerus with   mild anterior angulation.  2. High-grade partial tearing of the anterior supraspinatus tendon. Mild   edema seen along the inferior aspect of the supraspinatus more medially.  3. Partial-thickness articular sided insertional tear of the infraspinatus   tendon.  4. Diffuse degenerative changes throughout the labrum.  5. Enlargement and high signal within the middle glenohumeral ligament   likely related to partial tearing.  6. Synovitis in the rotator interval and axillary pouch.           Signing/Reading Doctor: Bernardo Heater ([id number])    Approved: Bernardo Heater ([id number])  Apr 03 2012  8:08AM                                    Results from [place] encounter on 07/07/11   MRI FOOT RT WO CONT   Narrative **Final Report**      ICD Codes / Adm.Diagnosis: 729.5  794.09 / Pain in limb    Examination:  MR FOOT WO CON RT  - [id number] - Jul 07 2011  1:39PM  Accession No:  [id number]  Reason:  pain, FOOT REGION: Fore      REPORT:  EXAM:  MR FOOT WO CON RT    INDICATION:  Pain and palpable lump in the dorsal, medial midfoot.    COMPARISON: None    TECHNIQUE: Axial, coronal, and sagittal noncontrast MRI of the right   forefoot in the T1, T2, and inversion-recovery pulse sequences with and   without fat saturation .    FINDINGS: Marker was placed on the skin dorsal to the Lisfranc joint.  Deep   to the marker, there is subtle amorphous hyperintense T2 signal surrounding   the dorsalis pedis neurovascular bundle.  Mild second TMT joint    osteoarthritis.  No erosion or joint effusion in the midfoot.    Mild first MTP and MTS joint osteoarthritis.  Small reactive joint effusion.   Hyperintense T2 signal is present between the first and second metatarsal   heads, most compatible with bursitis.    Bone marrow signal is within normal limits.  No acute fracture, dislocation,   or osteonecrosis.  Tendons are intact.  Muscle signal is within normal   limits.  Lisfranc ligament is intact.       IMPRESSION:  1.  Nonspecific soft tissue signal dorsal to the Lisfranc joint.    Differential diagnosis includes soft tissue contusion, dorsalis pedis   thrombophlebitis, and less likely capsulitis.  No measurable mass.  2.  Mild osteoarthritis.  3.  No fracture or osteonecrosis.           Signing/Reading Doctor: Marilynne Drivers [phone])    Approved: Marilynne Drivers [phone])  Jul 07 2011  2:04PM                                      VAS/US/Carotid Doppler Results (maximum last 3):    Results from [place] encounter on 03/19/16   DUPLEX LOWER EXT VENOUS BILAT    PET Results (maximum last 3):   No results found for this or any previous visit.        Assessment and Plan        60 year old woman on Xarelto for PE/DVT who was having mild symptoms concerning for possible TIA.  Fortunately, the symptoms resolved.  The facial asymmetry is questionable at best.  I reviewed the MRI and there are bilateral T2 changes possibly nonspecific in the setting of ischemic disease.  Would recommend continued follow up on that as an outpatient.  No restricted diffusion to suggest acute stroke.  I think is reasonable for her to remain on a baby aspirin daily.  I discussed the bleeding risks associated with combination antiplatelet and Xarelto.  Once she is medically cleared I anticipate she will be discharged and she can follow-up with Dr. Norval Gable in the outpatient clinic.    [place], DO  NEUROLOGIST  Diplomate ABPN  03/27/2016

## 2016-03-27 NOTE — Other (Signed)
Bedside RN performed patient education and medication education. Discharge concerns initiated and discussed with patient, including clarification on "who" assists the patient at their home and instructions for when the home going patient should call their provider after discharge. Opportunity for questions and clarification was provided.      Patient receptive to education: yes  Patient stated: I'm ready to go home  Barriers to Education: none  Diagnosis Education given:  YES    Length of stay: 0  Expected Day of Discharge: 1  Ask if they have "Help at Home" & add to white board?  YES    Education Day #: 2    Medication Education Given:  YES  M in the box Medication name: yes    Pt aware of HCAHPS survey: YES

## 2016-03-27 NOTE — Progress Notes (Signed)
Cm met with patient to go over observation status, all questions answered. Signed copy placed on bedside chart.  [employer], [place]

## 2016-03-30 ENCOUNTER — Ambulatory Visit
Admit: 2016-03-30 | Discharge: 2016-03-30 | Payer: PRIVATE HEALTH INSURANCE | Attending: Internal Medicine | Primary: Internal Medicine

## 2016-03-30 DIAGNOSIS — I6789 Other cerebrovascular disease: Secondary | ICD-10-CM

## 2016-03-30 MED ORDER — RIVAROXABAN 20 MG TAB
20 mg | ORAL_TABLET | Freq: Every day | ORAL | 0 refills | Status: DC
Start: 2016-03-30 — End: 2016-04-21

## 2016-03-30 MED ORDER — PRAVASTATIN 40 MG TAB
40 mg | ORAL_TABLET | Freq: Every evening | ORAL | 2 refills | Status: DC
Start: 2016-03-30 — End: 2016-09-28

## 2016-03-30 NOTE — Progress Notes (Signed)
NNTOCIP call.     Patient [facility] 03/[facility]/16 [facility]. Admission dates: 03/26/16 - 03/27/16.     Hospital Course:   1. Transient ischemic attack. Complete work up done and reviewed by Neurology.  Start the patient on aspirin   and statin. F/u withNeurology out patient.  2. Chest pain. Appears to be musculoskeletal. CTA of the chest is negative for pulmonary embolism. Will continue Xarelto. Negetive troponins. And echocardiogram  3. Anemia, chronic. Will monitor. No signs of bleeding.  4. Restless leg syndrome. Will continue home medications. Continue to monitor.   5. History of pulmonary embolism and deep venous thrombosis. Continue Xarelto  ??      Pt noted in for f/u with PCP Dr. Darl PikesGlynn today 03/30/16. Will f/u at a later time.    Future Appointments      Provider Department Center   04/23/2016 1:40 PM Jodi MourningMary M Ransom, MD [name] [place] SCHED   05/31/2016 11:30 AM Clarene CritchleyFrancesca L Glynn, MD [facility] [place] SCHED   09/15/2016 1:00 PM Clarene CritchleyFrancesca L Glynn, MD [facility]

## 2016-03-30 NOTE — Progress Notes (Signed)
HISTORY OF PRESENT ILLNESS  Lindsay [name] is a 60 y.o. female.  HPI  Transition of care visit for Lindsay MessierKathy who was hospitalized September 22nd through 23rd with question of stroke.  She had been diagnosed by me with a new left leg DVT two weeks ago and had been put back on her Xeralto 20 mg daily (she had been off of Xeralto for a time because she did not want to be on it).  She developed tight sensation in her chest last week and then a pulling, drawing sensation in her mouth.  Her chest CT in the emergency room on the 22nd was negative for clot.  She was hospitalized for question of TIA and MRI did show microvascular ischemic changes disproportionate for age but no acute stroke.  She was sent home on pravastatin and 81 mg of aspirin in addition to her Xeralto 20 mg with plans to follow-up with neurology, Dr. Alison Murrayansom, in a few weeks.  She had no further sensation in her face, no dysarthria, no trouble swallowing, no focal numbness or weakness.  She is having some headaches and fatigue.  She has had no excessive bleeding.          Review of Systems   Constitutional: Positive for malaise/fatigue. Negative for chills, fever and weight loss.   Respiratory: Negative for cough, shortness of breath and wheezing.    Cardiovascular: Negative for chest pain, palpitations, orthopnea, leg swelling and PND.   Gastrointestinal: Negative for blood in stool, heartburn and nausea.   Genitourinary: Negative for hematuria.   Musculoskeletal: Negative for myalgias.   Neurological: Positive for headaches. Negative for dizziness, tingling, sensory change, speech change and focal weakness.   Endo/Heme/Allergies: Does not bruise/bleed easily.       Physical Exam   Constitutional: She is oriented to person, place, and time. She appears well-developed and well-nourished.   HENT:   Head: Normocephalic and atraumatic.   Neck: Normal range of motion. Neck supple. Carotid bruit is not present. No thyromegaly present.    Cardiovascular: Normal rate, regular rhythm, S1 normal, S2 normal, normal heart sounds and intact distal pulses.    No murmur heard.  Pulmonary/Chest: Effort normal and breath sounds normal. No respiratory distress. She has no wheezes. She has no rales.   Musculoskeletal: She exhibits no edema.   Neurological: She is alert and oriented to person, place, and time.   Psychiatric: She has a normal mood and affect. Her behavior is normal.   Nursing note and vitals reviewed.      ASSESSMENT and PLAN  Diagnoses and all orders for this visit:    1. Cerebral microvascular disease-agree with asa 81 mg and pravachol and discussed goal ldl below 70-appt in 48mo and check lipids  Discussed inc risk of bleeding and bruising with asa and xarelto but I do think benefits are greater then risks    2. Dyslipidemia, goal LDL below 100  -     pravastatin (PRAVACHOL) 40 mg tablet; Take 1 Tab by mouth nightly.    3. Acute deep vein thrombosis (DVT) of left lower extremity, unspecified vein (HCC)-complete 21 days of 20 mg dose and then dec to 15 mg qd  -     rivaroxaban (XARELTO) 20 mg tab tablet; Take 1 Tab by mouth daily (with dinner).

## 2016-03-30 NOTE — Progress Notes (Signed)
Chief Complaint   Patient presents with   ??? Blood Clot     Follow up       1. Have you been to the ER, urgent care clinic since your last visit?  Hospital[facility]     2. Have you seen or consulted any other health care providers outside of the [facility] since your last visit?  Include any pap smears or colon screening. No      Advanced Care Directive is not on file. Information added to AVS.      Health Maintenance Due   Topic Date Due   ??? ZOSTER VACCINE AGE 62>  08/05/2013   ??? BREAST CANCER SCRN MAMMOGRAM  12/01/2013   ??? INFLUENZA AGE 7 TO ADULT  02/03/2016   ??? PAP AKA CERVICAL CYTOLOGY  05/05/2016

## 2016-03-31 NOTE — Telephone Encounter (Signed)
Pt is returning phone call, please call back

## 2016-03-31 NOTE — Telephone Encounter (Signed)
-----   Message from AtenShequita M Chavis sent at 03/31/2016 12:20 PM EDT -----  Regarding: Dr.Ransom/Telephone  Pt would like to get her more clarification on her MRI.  Best contact number is [phone].

## 2016-03-31 NOTE — Telephone Encounter (Signed)
Left message for patient to call the office back.

## 2016-03-31 NOTE — Telephone Encounter (Signed)
Spoke with patient. She has questions about the MRI she had over the weekend in the hospital. She stated her PCP reviewed it with her at her visit yesterday but still has questions about "ischemic changes". She stated she has a follow up with Dr. Alison Murrayansom on 04/23/16 but is very concerned about the results of the MRI.

## 2016-04-01 NOTE — Telephone Encounter (Signed)
Spoke with patient and informed her of Dr. Virl Sonansom's recommendations/interpretations of MRI. Patient was given an opportunity to ask questions, repeated information, and verbalized understanding.

## 2016-04-01 NOTE — Telephone Encounter (Signed)
Nokomis - Please call pt: These are the same changes that I have been seeing her for x years, since she first met me back at [facility], the white matter lesions that look like MS, but have been stable forever and her exam and history do not fit with MS.

## 2016-04-01 NOTE — Telephone Encounter (Signed)
-----   Message from Tomasa HoseJason A Heiser sent at 04/01/2016 12:07 PM EDT -----  Regarding: Dr. Alison Murrayansom / telepone    Pt is returning a phone call to Nurse  And awaiting information bets contact number Is [phone],

## 2016-04-01 NOTE — Telephone Encounter (Signed)
Left message for patient to call the office back.

## 2016-04-21 ENCOUNTER — Encounter

## 2016-04-21 MED ORDER — RIVAROXABAN 20 MG TAB
20 mg | ORAL_TABLET | Freq: Every day | ORAL | 2 refills | Status: DC
Start: 2016-04-21 — End: 2016-09-15

## 2016-04-21 NOTE — Telephone Encounter (Signed)
Pt doesn't have anymore xarelto.  She is out of medication.

## 2016-04-23 ENCOUNTER — Ambulatory Visit
Admit: 2016-04-23 | Discharge: 2016-04-23 | Payer: PRIVATE HEALTH INSURANCE | Attending: Neurology | Primary: Internal Medicine

## 2016-04-23 DIAGNOSIS — G2581 Restless legs syndrome: Secondary | ICD-10-CM

## 2016-04-23 MED ORDER — GABAPENTIN ENACARBIL ER 600 MG TABLET,EXTENDED RELEASE
600 mg | ORAL_TABLET | Freq: Every evening | ORAL | 11 refills | Status: DC
Start: 2016-04-23 — End: 2017-05-23

## 2016-04-23 NOTE — Progress Notes (Signed)
Patient here for follow up on RLS.

## 2016-04-23 NOTE — Progress Notes (Signed)
Neurology Progress Note    HISTORY PROVIDED BY: patient and daughter    Chief Complaint:   Chief Complaint   Patient presents with   ??? Leg Pain     f/u      Subjective:   Pt is a 60 y.o. right handed female last seen in clinic on 03/05/16 in f/u for h/o radiographic changes c/w MS, but no clinical symptoms, last MRI brain 10/22/14 was stable, and RLS, with an ill-defined irritating sensation in her legs with urge to move her legs beginning around 6PM and keeping her awake, was improved on Requip 79m qhs, worsened at last visit, not responding to 227mqhs.  Ongoing severe depression and anxiety. Exam was unchanged with mild right ptosis. Continued Requip 56m57mhs, added Horizant 300m3mM. Pt urged to call psychiatrist for an earlier appt or go to the ED if feeling suicidal.    She returns for f/u.  She was admitted to the hospital 03/26/16- 03/27/16 for chest pain, while on the way to the hospital, she felt like her mouth was pulling, looked in the mirror and had right facial droop, so she became a code stroke.  Her work up was unremarkable. No PE, no acute strokes.  CIWM changes seen on MRI brain 03/27/16, which is what I have been following her for since she first came to [facility] started ASA 81mg24mly and a statin with LDL of 138, for possible TIA.  She is still on Pradaxa for DVTs after her knee surgery.  She believes the Horizant is helping her sxs, but she is having trouble riding in the car or sitting still for any amount of time.  She was moving her feet throughout the MRI the first time they attempted it, had to abort it and had MRI repeated the next morning.  She moves throughout the day by walking.   Past Medical History:   Diagnosis Date   ??? Abnormal brain scan    ??? Arthritis    ??? Chronic pain    ??? Depression    ??? DVT (deep venous thrombosis) (HCC) [place]??? Falls frequently    ??? Fracture of left ankle    ??? Headache(784.0) 2011   ??? Ill-defined condition 09/15/2015    FX R HAND    ??? Other and unspecified hyperlipidemia 12/27/2012   ??? PE (pulmonary embolism) 12/2012   ??? RLS (restless legs syndrome) 03/16/2016    Dr Ysabel Stankovich Roseanne Reno? TIA (transient ischemic attack) 03/2016    Right facial droop, numbness in lip   ??? Torn rotator cuff     right      Past Surgical History:   Procedure Laterality Date   ??? BREAST SURGERY PROCEDURE UNLISTED  2002    AUGMENTATION   ??? HX APPENDECTOMY     ??? HX CESAREAN SECTION      x4   ??? HX GI      COLONOSCOPY   ??? 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 HX ORTHOPAEDIC  08/2004    rotater cuff r. shoulder   ??? HX ORTHOPAEDIC      left ankle fracture treated with air cast   ??? HX ORTHOPAEDIC  6/13    left shoulder fxt and dislocation   ??? HX OTHER SURGICAL  2010    left foot torn tendon   ??? HX TUBAL LIGATION        Social History  Social History   ??? Marital status: MARRIED     Spouse name: N/A   ??? Number of children: N/A   ??? Years of education: N/A     Occupational History   ??? Not on file.     Social History Main Topics   ??? Smoking status: Never Smoker   ??? Smokeless tobacco: Never Used   ??? Alcohol use 8.4 oz/week     14 Cans of beer per week      Comment: 2 MICHELOB ULTRA PER DAY   ??? Drug use: No   ??? Sexual activity: Yes     Partners: Male     Other Topics Concern   ??? Not on file     Social History Narrative     Family History   Problem Relation Age of Onset   ??? Heart Disease Mother    ??? Heart Disease Father    ??? Cancer Father      thyroid cancer   ??? Arthritis-rheumatoid Sister    ??? No Known Problems Son    ??? No Known Problems Daughter    ??? No Known Problems Daughter    ??? No Known Problems Daughter    ??? Anesth Problems Neg Hx           Objective:   ROS:  Per HPI or PMH o/w neg    No Known Allergies    Meds:  Outpatient Medications Prior to Visit   Medication Sig Dispense Refill   ??? rivaroxaban (XARELTO) 20 mg tab tablet Take 1 Tab by mouth daily (with dinner). 30 Tab 2   ??? pravastatin (PRAVACHOL) 40 mg tablet Take 1 Tab by mouth nightly. 30 Tab 2    ??? aspirin 81 mg chewable tablet Take 1 Tab by mouth daily. 30 Tab 0   ??? gabapentin enacarbil (HORIZANT) 300 mg TbER Take 300 Tabs by mouth daily. 30 Tab 5   ??? rOPINIRole (REQUIP) 1 mg tablet Take 2 tabs by mouth 1-3 hours before bedtime 60 Tab 5   ??? diazePAM (VALIUM) 5 mg tablet Take 5 mg by mouth three (3) times daily. (Takes 5 mg qam and 10 mg qhs)     ??? ergocalciferol (ERGOCALCIFEROL) 50,000 unit capsule Take 1 Cap by mouth every seven (7) days. 12 Cap 1   ??? escitalopram oxalate (LEXAPRO) 20 mg tablet Take 1 Tab by mouth daily. 30 Tab 5   ??? omeprazole (PRILOSEC) 20 mg capsule Take 20 mg by mouth nightly.     ??? HYDROcodone-acetaminophen (NORCO) 7.5-325 mg per tablet Take 1 Tab by mouth every eight (8) hours as needed for Pain.     ??? diazePAM (VALIUM) 10 mg tablet Take 10 mg by mouth nightly. (Takes 5 mg qam and 10 mg qhs)       No facility-administered medications prior to visit.        Imaging:  MRI Results (most recent):    Results from Hospital Encounter encounter on 03/26/16   MRI BRAIN WO CONT   Narrative INDICATION:  TIA (lip numbness, NIH stroke scale of 1)    EXAM: MRI BRAIN WITHOUT CONTRAST.    COMPARISON: CT head 03/26/2016 .     CONTRAST: None administered.     PULSE SEQUENCES: Sagittal T1-weighted images, axial T1-weighted images, coronal  T1-weighted images , axial FLAIR and T2 star weighted images, axial diffusion  weighted echo planar images, axial ADC mapping.    FINDINGS: Periventricular foci of T2 and FLAIR hyperintensity are numerous  and  more prominent than expected for age. Generalized prominence of cerebral sulci  and ventricles is mildly increased, upper normal for age.    The brain parenchyma and ventricular system otherwise are unremarkable in  appearance for age. No parenchymal mass or hemorrhage and no shift of midline  structures. No extra-axial fluid or blood collection. No additional signal  abnormality. The craniocervical junction is normal for age, as are other midline   structures. Large  vessels appear patent on spin-echo imaging. There is no acute  or subacute ischemia on diffusion weighting. .          Impression IMPRESSION:  1. Periventricular foci of T2 and FLAIR hyperintensity which would be consistent  with microvascular ischemic change but is out of proportion to the patient's  age.. Correlate clinically for other potential causes.  2. No acute or subacute ischemia or other acute intracranial abnormality.       CT Results (most recent):    Results from Hospital Encounter encounter on 03/26/16   CTA CHEST W OR W WO CONT   Narrative INDICATION:  Shortness of breath, assess for pulmonary embolus.    COMPARISON:  12/21/2015.    TECHNIQUE:  Noncontrast images were obtained to localize the pulmonary arteries.    Contiguous axial CT images were then obtained of the chest with 75 mL of  Isovue-370 contrast using the pulmonary angio protocol.    2D post processing was utilized, and coronal and sagittal reformatted images  were obtained.   In addition 3D post processing was utilized and MIP coronal  reconstructed images were obtained.   CT dose reduction was achieved through the use of a standardized protocol  tailored for this examination and automatic exposure control for dose  modulation.    FINDINGS:    No pulmonary embolus is detected.    The thoracic aorta is normal in caliber.    No dissecting aneurysm is seen.    Slight right lower lobe linear stranding is noted.  There is no pericardial effusion or pleural effusion.  Bilateral breast implants are noted.    There are no focal abnormalities detected within the visualized liver, spleen,  pancreas, and adrenal glands.  Degenerative change of the spine is noted.             Impression IMPRESSION:  No pulmonary embolus detected.    No aneurysm detected               Reviewed records in [facility] and media tab today    Lab Review   Results for orders placed or performed during the hospital encounter of 03/26/16    CBC WITH AUTOMATED DIFF   Result Value Ref Range    WBC 4.9 3.6 - 11.0 K/uL    RBC 4.22 3.80 - 5.20 M/uL    HGB 10.7 (L) 11.5 - 16.0 g/dL    HCT 33.9 (L) 35.0 - 47.0 %    MCV 80.3 80.0 - 99.0 FL    MCH 25.4 (L) 26.0 - 34.0 PG    MCHC 31.6 30.0 - 36.5 g/dL    RDW 14.5 11.5 - 14.5 %    PLATELET 213 150 - 400 K/uL    NEUTROPHILS 48 32 - 75 %    LYMPHOCYTES 40 12 - 49 %    MONOCYTES 7 5 - 13 %    EOSINOPHILS 4 0 - 7 %    BASOPHILS 1 0 - 1 %    ABS. NEUTROPHILS 2.4 1.8 -  8.0 K/UL    ABS. LYMPHOCYTES 1.9 0.8 - 3.5 K/UL    ABS. MONOCYTES 0.4 0.0 - 1.0 K/UL    ABS. EOSINOPHILS 0.2 0.0 - 0.4 K/UL    ABS. BASOPHILS 0.0 0.0 - 0.1 K/UL   METABOLIC PANEL, COMPREHENSIVE   Result Value Ref Range    Sodium 135 (L) 136 - 145 mmol/L    Potassium 4.0 3.5 - 5.1 mmol/L    Chloride 100 97 - 108 mmol/L    CO2 29 21 - 32 mmol/L    Anion gap 6 5 - 15 mmol/L    Glucose 87 65 - 100 mg/dL    BUN 11 6 - 20 MG/DL    Creatinine 0.72 0.55 - 1.02 MG/DL    BUN/Creatinine ratio 15 12 - 20      GFR est AA >60 >60 ml/min/1.40m    GFR est non-AA >60 >60 ml/min/1.736m   Calcium 8.6 8.5 - 10.1 MG/DL    Bilirubin, total 0.2 0.2 - 1.0 MG/DL    ALT (SGPT) 17 12 - 78 U/L    AST (SGOT) 14 (L) 15 - 37 U/L    Alk. phosphatase 126 (H) 45 - 117 U/L    Protein, total 7.1 6.4 - 8.2 g/dL    Albumin 3.5 3.5 - 5.0 g/dL    Globulin 3.6 2.0 - 4.0 g/dL    A-G Ratio 1.0 (L) 1.1 - 2.2     TROPONIN I   Result Value Ref Range    Troponin-I, Qt. <0.04 <0.05 ng/mL   LIPID PANEL   Result Value Ref Range    LIPID PROFILE          Cholesterol, total 245 (H) <200 MG/DL    Triglyceride 278 (H) <150 MG/DL    HDL Cholesterol 51 MG/DL    LDL, calculated 138.4 (H) 0 - 100 MG/DL    VLDL, calculated 55.6 MG/DL    CHOL/HDL Ratio 4.8 0 - 5.0     HEMOGLOBIN A1C WITH EAG   Result Value Ref Range    Hemoglobin A1c 6.0 4.2 - 6.3 %    Est. average glucose 126 mg/dL   CBC W/O DIFF   Result Value Ref Range    WBC 7.5 3.6 - 11.0 K/uL    RBC 4.36 3.80 - 5.20 M/uL    HGB 10.9 (L) 11.5 - 16.0 g/dL     HCT 34.5 (L) 35.0 - 47.0 %    MCV 79.1 (L) 80.0 - 99.0 FL    MCH 25.0 (L) 26.0 - 34.0 PG    MCHC 31.6 30.0 - 36.5 g/dL    RDW 14.4 11.5 - 14.5 %    PLATELET 222 150 - 400 K/uL   GLUCOSE, POC   Result Value Ref Range    Glucose (POC) 97 65 - 100 mg/dL    Performed by ReDaryl Eastern  POC INR   Result Value Ref Range    INR (POC) 1.1 <1.2     GLUCOSE, POC   Result Value Ref Range    Glucose (POC) 106 (H) 65 - 100 mg/dL    Performed by CoHermenia Bers  GLUCOSE, POC   Result Value Ref Range    Glucose (POC) 89 65 - 100 mg/dL    Performed by LaCaralyn Guile  EKG, 12 LEAD, INITIAL   Result Value Ref Range    Ventricular Rate 56 BPM    Atrial Rate 56 BPM  P-R Interval 134 ms    QRS Duration 76 ms    Q-T Interval 452 ms    QTC Calculation (Bezet) 436 ms    Calculated P Axis -20 degrees    Calculated R Axis 21 degrees    Calculated T Axis 27 degrees    Diagnosis       Sinus bradycardia  Nonspecific ST abnormality    When compared with ECG of 21-Dec-2015 10:43,  No significant change  Confirmed by Elba Barman, M.D., Mikeal Hawthorne [id number]) on 03/27/2016 11:27:22 AM          Exam:  Visit Vitals   ??? BP 104/70   ??? Pulse 77   ??? Resp 18   ??? Ht '5\' 3"'  (1.6 m)   ??? Wt 73.9 kg (163 lb)   ??? SpO2 93%   ??? BMI 28.87 kg/m2     General:  Alert, cooperative, no distress.   Head:  Normocephalic, without obvious abnormality, atraumatic.   Respiratory:  Heart:   Non labored breathing  Regular rate and rhythm, no murmurs       Extremities: Warm, no cyanosis or edema.   Pulses: 2+ radial pulses.       Neurologic:  MS: Alert and oriented x 4, speech intact. Language - inact. Attention and fund of knowledge appropriate.  Recent and remote memory intact.  Cranial Nerves:  II: visual fields Full to confrontation   II: pupils    II: optic disc    III,VII: ptosis Mild right ptosis - stable   III,IV,VI: extraocular muscles  EOMI, no nystagmus or diplopia   V: facial light touch sensation     VII: facial muscle function   symmetric   VIII: hearing intact    IX: soft palate elevation     XI: trapezius strength     XI: sternocleidomastoid strength    XII: tongue       Motor: normal bulk and tone, no tremor              Strength: 5/5 throughout, no PD    Coordination: FTN and RAM intact, and HTS intact  Gait: normal gait  Reflexes:              Assessment/Plan   Pt is a 60 y.o. right handed female with h/o radiographic changes c/w MS, but no clinical symptoms, last MRI brain 03/27/16 was stable, and RLS, with an ill-defined irritating sensation in her legs with urge to move her legs beginning around 6PM and keeping her awake, not responding to Requip 68m qhs, improved on Horizant 3044mqPM, but still having trouble sitting still during the day. Possible TIA in Sept with unremarkable work up, on PrGuadalupe Exam with stable right ptosis.  Recommend increasing Horizant to 60074mPM. Continue Requip 2mg49ms. F/u in clinic in 3 months, instructed to call in the interim if needed.       ICD-10-CM ICD-9-CM    1. RLS (restless legs syndrome) G25.81 333.94    2. Abnormal brain scan R94.02 794.09    3. Transient cerebral ischemia, unspecified type G45.9 435.9        Signed:  MaryChauncey Reading  04/23/2016

## 2016-04-23 NOTE — Patient Instructions (Signed)
PRESCRIPTION REFILL POLICY    [facility]   Statement to Patients  October 03, 2012      In an effort to ensure the large volume of patient prescription refills is processed in the most efficient and expeditious manner, we are asking our patients to assist us by calling your Pharmacy for all prescription refills, this will include also your  Mail Order Pharmacy. The pharmacy will contact our office electronically to continue the refill process.    Please do not wait until the last minute to call your pharmacy. We need at least 48 hours (2days) to fill prescriptions. We also encourage you to call your pharmacy before going to pick up your prescription to make sure it is ready.     With regard to controlled substance prescription refill requests (narcotic refills) that need to be picked up at our office, we ask your cooperation by providing us with at least 72 hours (3days) notice that you will need a refill.    We will not refill narcotic prescription refill requests after 4:00pm on any weekday, Monday through Thursday, or after 2:00pm on Fridays, or on the weekends.      We encourage everyone to explore another way of getting your prescription refill request processed using MyChart, our patient web portal through our electronic medical record system. MyChart is an efficient and effective way to communicate your medication request directly to the office and  downloadable as an app on your smart phone . MyChart also features a review functionality that allows you to view your medication list as well as leave messages for your physician. Are you ready to get connected? If so please review the attatched instructions or speak to any of our staff to get you set up right away!    Thank you so much for your cooperation. Should you have any questions please contact our Practice Administrator.    The Physicians and Staff,  [facility]            Please bring all medication bottles, including vitamins, supplements and any over-the-counter medications, to your next office visit.

## 2016-04-27 ENCOUNTER — Encounter

## 2016-04-27 ENCOUNTER — Inpatient Hospital Stay: Admit: 2016-04-27 | Payer: BLUE CROSS/BLUE SHIELD | Attending: Orthopaedic Surgery | Primary: Internal Medicine

## 2016-04-27 DIAGNOSIS — M79604 Pain in right leg: Secondary | ICD-10-CM

## 2016-04-27 NOTE — Procedures (Signed)
[name]  *** FINAL REPORT ***    Name: Lindsay [name], Rick  MRN: [id number]    Outpatient  DOB: 03 Oct 1955  HIS Order #: [id number]  TRAKnet Visit #: [id number]  Date: 27 Apr 2016    TYPE OF TEST: Peripheral Venous Testing    REASON FOR TEST  Pain in limb    Right Leg:-  Deep venous thrombosis:           No  Superficial venous thrombosis:    No  Deep venous insufficiency:        Not examined  Superficial venous insufficiency: Not examined      INTERPRETATION/FINDINGS  PROCEDURE:  Color duplex ultrasound imaging of lower extremity veins.    FINDINGS:       Right: The common femoral, deep femoral, femoral, popliteal,  posterior tibial, peroneal, and great saphenous are patent and without   evidence of thrombus; each is is fully compressible and there is no  narrowing of the flow channel on color Doppler imaging.  Phasic flow  is observed in the common femoral vein.       Left:   The common femoral vein is patent and without evidence of   thrombus.  Phasic flow is observed.  This extremity was not otherwise   evaluated.    IMPRESSION:  No evidence of right lower extremity vein thrombosis.    ADDITIONAL COMMENTS    I have personally reviewed the data relevant to the interpretation of  this  study.    TECHNOLOGIST: Zenaida DeedJulie A. Donette Larryoone, [place]  Signed: 04/27/2016 01:05 PM    PHYSICIAN: Gaetana MichaelisGregg Daizha Anand, MD  Signed: 04/27/2016 04:24 PM

## 2016-04-27 NOTE — Procedures (Signed)
[name]  *** FINAL REPORT ***    Name: Lindsay [name], Lindsay [name]  MRN: [id number]    Outpatient  DOB: 03 Oct 1955  HIS Order #: [id number]  TRAKnet Visit #: [id number]  Date: 27 Apr 2016    TYPE OF TEST: Peripheral Venous Testing    REASON FOR TEST  Pain in limb    Right Leg:-  Deep venous thrombosis:           No  Superficial venous thrombosis:    No  Deep venous insufficiency:        Not examined  Superficial venous insufficiency: Not examined      INTERPRETATION/FINDINGS  PROCEDURE:  Color duplex ultrasound imaging of lower extremity veins.    FINDINGS:       Right: The common femoral, deep femoral, femoral, popliteal,  posterior tibial, peroneal, and great saphenous are patent and without   evidence of thrombus; each is is fully compressible and there is no  narrowing of the flow channel on color Doppler imaging.  Phasic flow  is observed in the common femoral vein.       Left:   The common femoral vein is patent and without evidence of   thrombus.  Phasic flow is observed.  This extremity was not otherwise   evaluated.    IMPRESSION:  No evidence of right lower extremity vein thrombosis.    ADDITIONAL COMMENTS    I have personally reviewed the data relevant to the interpretation of  this  study.    TECHNOLOGIST: Julie A. Toone, RVS  Signed: 04/27/2016 01:05 PM    PHYSICIAN: Lametria Klunk, MD  Signed: 04/27/2016 04:24 PM

## 2016-05-06 NOTE — Telephone Encounter (Signed)
-----   Message from Standley BrookingKhadija Turner sent at 05/06/2016 12:37 PM EDT -----  Regarding: Dr.Ransom/Telephone  Pt called to infom the MD that she had a memory fog yesterday [id number]. Pt had to regroup her self it took approximately about 5 minutes for her to piece her thought back together, it really scared her and she just wanted this to be noted in her record.

## 2016-05-14 NOTE — Telephone Encounter (Signed)
Noted.  Will discuss at her f/u appt.

## 2016-05-20 NOTE — Telephone Encounter (Signed)
Lindsay [name] - Rep from [employer] :    Has a few questions concerning Diagnosis for patient. They are trying to rule some out.  Ask that the nurse returns her call at [phone]

## 2016-05-20 NOTE — Telephone Encounter (Signed)
V/m left for return call.

## 2016-05-20 NOTE — Telephone Encounter (Signed)
[employer] returning the nurse call.  Call [phone]

## 2016-05-21 ENCOUNTER — Inpatient Hospital Stay
Admit: 2016-05-21 | Discharge: 2016-05-21 | Disposition: A | Discharge status: Home / Self Care | Payer: BLUE CROSS/BLUE SHIELD | Attending: Emergency Medicine

## 2016-05-21 DIAGNOSIS — M79604 Pain in right leg: Secondary | ICD-10-CM

## 2016-05-21 LAB — PROTHROMBIN TIME + INR
INR: 1 (ref 0.9–1.1)
Prothrombin time: 9.6 s (ref 9.0–11.1)

## 2016-05-21 LAB — CBC WITH AUTOMATED DIFF
ABS. BASOPHILS: 0.1 10*3/uL (ref 0.0–0.1)
ABS. EOSINOPHILS: 0.2 10*3/uL (ref 0.0–0.4)
ABS. LYMPHOCYTES: 1.7 10*3/uL (ref 0.8–3.5)
ABS. MONOCYTES: 0.5 10*3/uL (ref 0.0–1.0)
ABS. NEUTROPHILS: 2.7 10*3/uL (ref 1.8–8.0)
BASOPHILS: 1 % (ref 0–1)
EOSINOPHILS: 4 % (ref 0–7)
HCT: 34.3 % — ABNORMAL LOW (ref 35.0–47.0)
HGB: 10.8 g/dL — ABNORMAL LOW (ref 11.5–16.0)
LYMPHOCYTES: 34 % (ref 12–49)
MCH: 26 PG (ref 26.0–34.0)
MCHC: 31.5 g/dL (ref 30.0–36.5)
MCV: 82.7 FL (ref 80.0–99.0)
MONOCYTES: 10 % (ref 5–13)
NEUTROPHILS: 51 % (ref 32–75)
PLATELET: 241 10*3/uL (ref 150–400)
RBC: 4.15 M/uL (ref 3.80–5.20)
RDW: 14.7 % — ABNORMAL HIGH (ref 11.5–14.5)
WBC: 5.2 10*3/uL (ref 3.6–11.0)

## 2016-05-21 LAB — METABOLIC PANEL, COMPREHENSIVE
A-G Ratio: 0.9 — ABNORMAL LOW (ref 1.1–2.2)
ALT (SGPT): 21 U/L (ref 12–78)
AST (SGOT): 12 U/L — ABNORMAL LOW (ref 15–37)
Albumin: 3.7 g/dL (ref 3.5–5.0)
Alk. phosphatase: 132 U/L — ABNORMAL HIGH (ref 45–117)
Anion gap: 9 mmol/L (ref 5–15)
BUN/Creatinine ratio: 20 (ref 12–20)
BUN: 15 MG/DL (ref 6–20)
Bilirubin, total: 0.3 MG/DL (ref 0.2–1.0)
CO2: 28 mmol/L (ref 21–32)
Calcium: 8.5 MG/DL (ref 8.5–10.1)
Chloride: 102 mmol/L (ref 97–108)
Creatinine: 0.74 MG/DL (ref 0.55–1.02)
GFR est AA: >60 mL/min/{1.73_m2} (ref >60)
GFR est non-AA: >60 mL/min/{1.73_m2} (ref >60)
Globulin: 4 g/dL (ref 2.0–4.0)
Glucose: 97 mg/dL (ref 65–100)
Potassium: 4.2 mmol/L (ref 3.5–5.1)
Protein, total: 7.7 g/dL (ref 6.4–8.2)
Sodium: 139 mmol/L (ref 136–145)

## 2016-05-21 LAB — PTT: aPTT: 26.5 s (ref 22.1–32.5)

## 2016-05-21 NOTE — Procedures (Signed)
[name]  *** FINAL REPORT ***    Name: [name], Lindsay  MRN: [id number]  DOB: 03 Oct 1955  HIS Order #: [id number]  TRAKnet Visit #: [id number]  Date: 21 May 2016    TYPE OF TEST: Peripheral Venous Testing    REASON FOR TEST  Knee pain    Right Leg:-  Deep venous thrombosis:           No  Superficial venous thrombosis:    No  Deep venous insufficiency:        Not examined  Superficial venous insufficiency: Not examined      INTERPRETATION/FINDINGS  PROCEDURE:  Color duplex ultrasound imaging of lower extremity veins.    FINDINGS:       Right: The common femoral, deep femoral, femoral, popliteal,  posterior tibial, peroneal, and great saphenous are patent and without   evidence of thrombus; each is is fully compressible and there is no  narrowing of the flow channel on color Doppler imaging.  Phasic flow  is observed in the common femoral vein.       Left:   The common femoral vein is patent and without evidence of   thrombus.  Phasic flow is observed.  This extremity was not otherwise   evaluated.    IMPRESSION:  No evidence of right lower extremity vein thrombosis.    ADDITIONAL COMMENTS    I have personally reviewed the data relevant to the interpretation of  this  study.    TECHNOLOGIST: Jodi MarbleGirma Herego, RVT  Signed: 05/21/2016 02:55 PM    PHYSICIAN: Gaetana MichaelisGregg Clayburn Weekly, MD  Signed: 05/21/2016 03:07 PM

## 2016-05-21 NOTE — Telephone Encounter (Signed)
V/m left for return call.

## 2016-05-21 NOTE — ED Notes (Signed)
Patient verbalizes understanding of discharge instructions. Pt alert and oriented, appears in no acute distress, respirations equal and unlabored. Ambulatory upon discharge with steady gait.

## 2016-05-21 NOTE — Procedures (Signed)
[name]  *** FINAL REPORT ***    Name: [name], Lindsay  MRN: [id number]  DOB: 03 Oct 1955  HIS Order #: [id number]  TRAKnet Visit #: [id number]  Date: 21 May 2016    TYPE OF TEST: Peripheral Venous Testing    REASON FOR TEST  Knee pain    Right Leg:-  Deep venous thrombosis:           No  Superficial venous thrombosis:    No  Deep venous insufficiency:        Not examined  Superficial venous insufficiency: Not examined      INTERPRETATION/FINDINGS  PROCEDURE:  Color duplex ultrasound imaging of lower extremity veins.    FINDINGS:       Right: The common femoral, deep femoral, femoral, popliteal,  posterior tibial, peroneal, and great saphenous are patent and without   evidence of thrombus; each is is fully compressible and there is no  narrowing of the flow channel on color Doppler imaging.  Phasic flow  is observed in the common femoral vein.       Left:   The common femoral vein is patent and without evidence of   thrombus.  Phasic flow is observed.  This extremity was not otherwise   evaluated.    IMPRESSION:  No evidence of right lower extremity vein thrombosis.    ADDITIONAL COMMENTS    I have personally reviewed the data relevant to the interpretation of  this  study.    TECHNOLOGIST: Girma Herego, RVT  Signed: 05/21/2016 02:55 PM    PHYSICIAN: Edna Rede, MD  Signed: 05/21/2016 03:07 PM

## 2016-05-21 NOTE — ED Triage Notes (Signed)
C/o right knee burning radiating from groin to ankle intermittently since last night "where theres an incision site from June."   Also c/o right sided chest pains, neck pain and jaw pain since last night.  Also c/o headache since this morning.    Hx DVT's, most recently in September.  Taking Xarelto, been compliant.

## 2016-05-21 NOTE — ED Provider Notes (Signed)
HPI Comments: 60 y.o. female with extensive past medical history, please see list, significant for PE, DVT, TIA, and frequent falls who presents from home via private vehicle with chief complaint of knee pain. Pt a right knee replacement in June. Pt had multiple blood clots after the surgery and started 20 mg Xarelto daily. Pt states she saw the orthopedist on Tuesday with no change. Pt states on Wednesday night her incision site was burning and the back of her neck hurt. Pt states she iced her knee with relief. Pt reports she woke up at midnight with her incision site burning again with accompanying pain in right leg, right jaw, right lung, and burning in her chest. Pt reports she called her PCP today who recommended she come in the ED to R/U a blood clot. Pt states she is lightheaded but has no headache. Pt denies any chest pain or abdominal pain. There are no other acute medical concerns at this time.    Old Chart Review: Pt was admitted here from 9/22-9/23 for SOB, chest heaviness, and lip numbness. Pt was dx with a TIA and started ASA. Pt was seen by neurology and told to continue Xarelto. Pt had a LLL PE on 12/21/15. Pt had a chest CT on 03/26/16 that showed no PE.     Social hx: Nonsmoker, Current EtOH use  PCP: Flora Lipps, MD    Note written by Sherral Hammers. Doran Durand, Education administrator, as dictated by Horris Latino, DO 2:19 PM      The history is provided by the patient. No language interpreter was used.        Past Medical History:   Diagnosis Date   ??? Abnormal brain scan    ??? Arthritis    ??? Chronic pain    ??? Depression    ??? DVT (deep venous thrombosis) ([place])    ??? Falls frequently    ??? Fracture of left ankle    ??? Headache(784.0) 2011   ??? Ill-defined condition 09/15/2015    FX R HAND   ??? Other and unspecified hyperlipidemia 12/27/2012   ??? PE (pulmonary embolism) 12/2012   ??? RLS (restless legs syndrome) 03/16/2016    Dr Roseanne Reno   ??? TIA (transient ischemic attack) 03/2016    Right facial droop, numbness in lip    ??? Torn rotator cuff     right       Past Surgical History:   Procedure Laterality Date   ??? BREAST SURGERY PROCEDURE UNLISTED  2002    AUGMENTATION   ??? HX APPENDECTOMY     ??? HX CESAREAN SECTION      x4   ??? HX GI      COLONOSCOPY   ??? 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 HX ORTHOPAEDIC  08/2004    rotater cuff r. shoulder   ??? HX ORTHOPAEDIC      left ankle fracture treated with air cast   ??? HX ORTHOPAEDIC  6/13    left shoulder fxt and dislocation   ??? HX OTHER SURGICAL  2010    left foot torn tendon   ??? HX TUBAL LIGATION           Family History:   Problem Relation Age of Onset   ??? Heart Disease Mother    ??? Heart Disease Father    ??? Cancer Father      thyroid cancer   ??? Arthritis-rheumatoid Sister    ??? No Known Problems Son    ???  No Known Problems Daughter    ??? No Known Problems Daughter    ??? No Known Problems Daughter    ??? Anesth Problems Neg Hx        Social History     Social History   ??? Marital status: MARRIED     Spouse name: N/A   ??? Number of children: N/A   ??? Years of education: N/A     Occupational History   ??? Not on file.     Social History Main Topics   ??? Smoking status: Never Smoker   ??? Smokeless tobacco: Never Used   ??? Alcohol use 8.4 oz/week     14 Cans of beer per week      Comment: 2 MICHELOB ULTRA PER DAY   ??? Drug use: No   ??? Sexual activity: Yes     Partners: Male     Other Topics Concern   ??? Not on file     Social History Narrative         ALLERGIES: Review of patient's allergies indicates no known allergies.    Review of Systems   Constitutional: Negative for fever.   HENT:        Jaw Pain   Gastrointestinal: Negative for abdominal pain.   Musculoskeletal: Positive for myalgias and neck pain.   Neurological: Positive for light-headedness. Negative for headaches.   All other systems reviewed and are negative.      Vitals:    05/21/16 1333   BP: 111/74   Pulse: 71   Resp: 16   Temp: 98.3 ??F (36.8 ??C)   SpO2: 95%   Weight: 78.6 kg (173 lb 3.2 oz)   Height: 5' 3" (1.6 m)            Physical Exam       Constitutional: Pt is awake and alert.  Pt appears well-developed and well-nourished. NAD. Pt appears anxious.   HENT:   Head: Normocephalic and atraumatic.   Nose: Nose normal.   Mouth/Throat: Oropharynx is clear and moist. No oropharyngeal exudate.   Eyes: Conjunctivae and extraocular motions are normal. Pupils are equal, round, and reactive to light. Right eye exhibits no discharge. Left eye exhibits no discharge. No scleral icterus.   Neck: No tracheal deviation present. Supple neck.  Cardiovascular: Normal rate, regular rhythm, normal heart sounds and intact distal pulses.  Exam reveals no gallop and no friction rub.    No murmur heard.  Pulmonary/Chest: Effort normal and breath sounds normal.  Pt  has no wheezes.  Pt  has no rales.   Abdominal: Soft.  Pt  exhibits no distension and no mass. No tenderness.  Pt  has no rebound and no guarding.   Musculoskeletal:  Pt  exhibits no edema and no tenderness.   Ext: Normal ROM in all four extremities; not tender to palpation; distal pulses are normal, no edema.   Neurological:  Pt is alert.  nonfocal neuro exam.  Skin: Skin is warm and dry.  Pt  is not diaphoretic.   Psychiatric:  Pt  has a normal mood and affect. Behavior is normal.     Note written by Whitney A. Doran Durand, Education administrator, as dictated by Horris Latino, DO 2:19 PM      MDM  ED Course       Procedures           Labs Reviewed   CBC WITH AUTOMATED DIFF - Abnormal; Notable for the following:  Result Value    HGB 10.8 (*)     HCT 34.3 (*)     RDW 14.7 (*)     All other components within normal limits   METABOLIC PANEL, COMPREHENSIVE - Abnormal; Notable for the following:     AST (SGOT) 12 (*)     Alk. phosphatase 132 (*)     A-G Ratio 0.9 (*)     All other components within normal limits   SAMPLES BEING HELD   PROTHROMBIN TIME + INR   PTT     [place] neg - DC home                  ED EKG interpretation:  Rhythm: NSR.  Rate 69.  Regular.  No ectopy.  No acute sttwa seen.  This  EKG was interpreted by Horris Latino, DO,ED Provider.

## 2016-05-21 NOTE — Telephone Encounter (Signed)
Spoke with Gabriel RungMonique who wanted to confirm if the patient has a diagnosis of MS. After reviewing her chart notified patient does not have MS as a current diagnosis. Monique voiced understanding.

## 2016-05-21 NOTE — Telephone Encounter (Signed)
Pt called with some concerns, would like to speak with the nurse  Pt had some chest pains last night and some other issues , was difficult to hear patient .     Pt feels like her left knee incision is inflamed and "feels like there may be something inside of it" because it's on fire.     Return call at [phone]

## 2016-05-21 NOTE — Telephone Encounter (Signed)
Returned patient's call complaining of pain & burning in her left leg that has been going on since Wednesday. She states the pain stated in her knee & radiates down to her ankle. She has tried icing her knee & using hydrocodone for pain but it has not worked. Last night while in bed she began having pains in her chest. The chest pain has eased up but she notes her leg is still burning. I advised patient with her history of blood clots & recent knee surgery she needs to go to the ED to make sure she does not have another blood clot. Patient agreed & [facility].

## 2016-05-24 LAB — EKG 12-LEAD
Atrial Rate: 69 {beats}/min
Diagnosis: NORMAL
P Axis: 42 degrees
P-R Interval: 136 ms
Q-T Interval: 414 ms
QRS Duration: 74 ms
QTc Calculation (Bazett): 443 ms
R Axis: 18 degrees
T Axis: 41 degrees
Ventricular Rate: 69 {beats}/min

## 2016-05-24 LAB — EKG, 12 LEAD, INITIAL
Atrial Rate: 69 {beats}/min
Calculated P Axis: 42 degrees
Calculated R Axis: 18 degrees
Calculated T Axis: 41 degrees
Diagnosis: NORMAL
P-R Interval: 136 ms
Q-T Interval: 414 ms
QRS Duration: 74 ms
QTC Calculation (Bezet): 443 ms
Ventricular Rate: 69 {beats}/min

## 2016-05-31 ENCOUNTER — Ambulatory Visit: Admit: 2016-05-31 | Payer: PRIVATE HEALTH INSURANCE | Attending: Internal Medicine | Primary: Internal Medicine

## 2016-05-31 DIAGNOSIS — G8929 Other chronic pain: Secondary | ICD-10-CM

## 2016-05-31 NOTE — Progress Notes (Signed)
HISTORY OF PRESENT ILLNESS  Lindsay [name] is a 60 y.o. female.  HPI  Lindsay MessierKathy comes in for me to check on her right knee.  She is six months out from total knee replacement.  She is on Xarelto for clots.  She went on November 17th with a sensation of swelling and pain in the knee to the emergency room.  Fortunately doppler was negative for DVT.  She has not missed her Xarelto.  She still notes some swelling in that knee.  She is still in physical therapy for an additional three weeks as prescribed by ortho.  She has not had fevers or chills or drainage from her knee.  She is still having some restless legs.  She is trying Requip and tried long-acting Gabapentin but felt funny in the head with higher doses so dropped back to 300 mg.           Review of Systems   Constitutional: Positive for malaise/fatigue. Negative for chills, fever and weight loss.   Respiratory: Negative for cough, shortness of breath and wheezing.    Cardiovascular: Negative for chest pain, palpitations, orthopnea, leg swelling and PND.   Gastrointestinal: Negative for heartburn and nausea.   Musculoskeletal: Positive for joint pain. Negative for falls and myalgias.   Neurological: Negative for dizziness and headaches.   Psychiatric/Behavioral: The patient is nervous/anxious.        Physical Exam   Constitutional: She is oriented to person, place, and time. She appears well-developed and well-nourished.   HENT:   Head: Normocephalic and atraumatic.   Neck: Normal range of motion. Neck supple. Carotid bruit is not present. No thyromegaly present.   Cardiovascular: Normal rate, regular rhythm, S1 normal, S2 normal, normal heart sounds and intact distal pulses.    No murmur heard.  Pulmonary/Chest: Effort normal and breath sounds normal. No respiratory distress. She has no wheezes. She has no rales.   Musculoskeletal: She exhibits no edema.   Healed bilat tkr scars not red or warm, minimal effusion right medial aspect    Neurological: She is alert and oriented to person, place, and time.   Psychiatric: She has a normal mood and affect. Her behavior is normal.   Nursing note and vitals reviewed.      ASSESSMENT and PLAN  Diagnoses and all orders for this visit:    1. Chronic pain of right knee-using tylenol and doing pt and reassured no sign of infection or clot based on recent cbc and doppler study    2. 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 RLS (restless legs syndrome)    4. Encounter for immunization  -     INFLUENZA VIRUS VACCINE QUADRIVALENT, PRESERVATIVE FREE SYRINGE ([phone]  -     PR IMMUNIZ ADMIN,1 SINGLE/COMB VAC/TOXOID

## 2016-07-26 ENCOUNTER — Ambulatory Visit
Admit: 2016-07-26 | Discharge: 2016-07-26 | Payer: PRIVATE HEALTH INSURANCE | Attending: Neurology | Primary: Internal Medicine

## 2016-07-26 DIAGNOSIS — G2581 Restless legs syndrome: Secondary | ICD-10-CM

## 2016-07-26 MED ORDER — ROPINIROLE 1 MG TAB
1 mg | ORAL_TABLET | ORAL | 5 refills | Status: DC
Start: 2016-07-26 — End: 2016-10-27

## 2016-07-26 NOTE — Patient Instructions (Signed)
PRESCRIPTION REFILL POLICY    [facility]   Statement to Patients  October 03, 2012      In an effort to ensure the large volume of patient prescription refills is processed in the most efficient and expeditious manner, we are asking our patients to assist us by calling your Pharmacy for all prescription refills, this will include also your  Mail Order Pharmacy. The pharmacy will contact our office electronically to continue the refill process.    Please do not wait until the last minute to call your pharmacy. We need at least 48 hours (2days) to fill prescriptions. We also encourage you to call your pharmacy before going to pick up your prescription to make sure it is ready.     With regard to controlled substance prescription refill requests (narcotic refills) that need to be picked up at our office, we ask your cooperation by providing us with at least 72 hours (3days) notice that you will need a refill.    We will not refill narcotic prescription refill requests after 4:00pm on any weekday, Monday through Thursday, or after 2:00pm on Fridays, or on the weekends.      We encourage everyone to explore another way of getting your prescription refill request processed using MyChart, our patient web portal through our electronic medical record system. MyChart is an efficient and effective way to communicate your medication request directly to the office and  downloadable as an app on your smart phone . MyChart also features a review functionality that allows you to view your medication list as well as leave messages for your physician. Are you ready to get connected? If so please review the attatched instructions or speak to any of our staff to get you set up right away!    Thank you so much for your cooperation. Should you have any questions please contact our Practice Administrator.    The Physicians and Staff,  [facility]            Please bring all medication bottles, including vitamins, supplements and any over-the-counter medications, to your next office visit.

## 2016-07-26 NOTE — Progress Notes (Signed)
Patient here for follow up on RLS.

## 2016-07-26 NOTE — Progress Notes (Signed)
Neurology Progress Note    HISTORY PROVIDED BY: patient and daughter    Chief Complaint:   Chief Complaint   Patient presents with   ??? Neurologic Problem     f/u RLS      Subjective:   Pt is a 61 y.o. right handed female last seen in clinic on 04/23/16 in f/u for h/o radiographic changes c/w MS, but no clinical symptoms, last MRI brain 03/27/16 was stable, and RLS, with an ill-defined irritating sensation in her legs with urge to move her legs beginning around 6PM and keeping her awake, not responding to Requip 22m qhs, improved on Horizant 3088mqPM, but was still having trouble sitting still during the day. Possible TIA in Sept with unremarkable work up, [place] with stable right ptosis.  Recommended increasing Horizant to 60083mPM. Continued Requip 2mg45ms.     She returns for f/u. She is now on Xarelto.  She called the clinic 05/06/16 reporting a spell of memory loss.  She was going to the grocery store and could not remember which grocery store she went to the day before, it took her about 5 mintues, then she remembered which store it was.  She reports ongoing movements when sitting, goes to bed at 7:30-8PM, walking 2000 steps every night.  Did not go to her sister-in-laws for Christmas Eve because she would have to walk around her house and outside.  She notes feeling wobbly with walking with feeling like her knee is going to give out and has knee pain.  Notes that she doesn't feel like herself and mentions the office being a wreck. She is now back at work, having trouble getting things back in order.  Her daughter is moving in with her and her husband today.  She is unable to go to the movies because she cannot sit still.  Notes that Christmas is the ultimate time of year for her, she did not have the energy to put up decorations.  States she feels like running away, mentions [place] getting her toes cut off. Mentions "dressing  like a 61yo, eating like a 61yo", it is not clear how this is linked to her RLS.  Very tangential history. Goes to sleep about 8PM with TV on, on sofa, doesn't get up until 10AM, due to high bed and husband snoring.   She is still seeing a psychiatrist and was seeing a psychologist.         Past Medical History:   Diagnosis Date   ??? Abnormal brain scan    ??? Arthritis    ??? Chronic pain    ??? Depression    ??? DVT (deep venous thrombosis) (HCC)[place] ??? Falls frequently    ??? Fracture of left ankle    ??? Headache(784.0) 2011   ??? Ill-defined condition 09/15/2015    FX R HAND   ??? Other and unspecified hyperlipidemia 12/27/2012   ??? PE (pulmonary embolism) 12/2012   ??? RLS (restless legs syndrome) 03/16/2016    Dr MaryRoseanne Reno?? TIA (transient ischemic attack) 03/2016    Right facial droop, numbness in lip   ??? Torn rotator cuff     right      Past Surgical History:   Procedure Laterality Date   ??? BREAST SURGERY PROCEDURE UNLISTED  2002    AUGMENTATION   ??? HX APPENDECTOMY     ??? HX CESAREAN SECTION      x4   ??? HX  GI      COLONOSCOPY   ??? 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 HX ORTHOPAEDIC  08/2004    rotater cuff r. shoulder   ??? HX ORTHOPAEDIC      left ankle fracture treated with air cast   ??? HX ORTHOPAEDIC  6/13    left shoulder fxt and dislocation   ??? HX OTHER SURGICAL  2010    left foot torn tendon   ??? HX TUBAL LIGATION        Social History     Social History   ??? Marital status: MARRIED     Spouse name: N/A   ??? Number of children: N/A   ??? Years of education: N/A     Occupational History   ??? Not on file.     Social History Main Topics   ??? Smoking status: Never Smoker   ??? Smokeless tobacco: Never Used   ??? Alcohol use 8.4 oz/week     14 Cans of beer per week      Comment: 2 MICHELOB ULTRA PER DAY   ??? Drug use: No   ??? Sexual activity: Yes     Partners: Male     Other Topics Concern   ??? Not on file     Social History Narrative     Family History   Problem Relation Age of Onset   ??? Heart Disease Mother    ??? Heart Disease Father     ??? Cancer Father      thyroid cancer   ??? Arthritis-rheumatoid Sister    ??? No Known Problems Son    ??? No Known Problems Daughter    ??? No Known Problems Daughter    ??? No Known Problems Daughter    ??? Anesth Problems Neg Hx           Objective:   ROS:  Per HPI or PMH o/w neg    No Known Allergies    Meds:  Outpatient Medications Prior to Visit   Medication Sig Dispense Refill   ??? gabapentin enacarbil (HORIZANT) 600 mg TbER Take 1 Tab by mouth every evening. 30 Tab 11   ??? rivaroxaban (XARELTO) 20 mg tab tablet Take 1 Tab by mouth daily (with dinner). 30 Tab 2   ??? pravastatin (PRAVACHOL) 40 mg tablet Take 1 Tab by mouth nightly. 30 Tab 2   ??? diazePAM (VALIUM) 10 mg tablet Take 10 mg by mouth nightly. (Takes 5 mg qam and 10 mg qhs)     ??? rOPINIRole (REQUIP) 1 mg tablet Take 2 tabs by mouth 1-3 hours before bedtime 60 Tab 5   ??? ergocalciferol (ERGOCALCIFEROL) 50,000 unit capsule Take 1 Cap by mouth every seven (7) days. 12 Cap 1   ??? escitalopram oxalate (LEXAPRO) 20 mg tablet Take 1 Tab by mouth daily. 30 Tab 5   ??? omeprazole (PRILOSEC) 20 mg capsule Take 20 mg by mouth nightly.     ??? aspirin 81 mg chewable tablet Take 1 Tab by mouth daily. 30 Tab 0   ??? HYDROcodone-acetaminophen (NORCO) 7.5-325 mg per tablet Take 1 Tab by mouth every eight (8) hours as needed for Pain.       No facility-administered medications prior to visit.        Imaging:  MRI Results (most recent):    Results from Hospital Encounter encounter on 03/26/16   MRI BRAIN WO CONT   Narrative INDICATION:  TIA (lip numbness, NIH stroke scale of 1)  EXAM: MRI BRAIN WITHOUT CONTRAST.    COMPARISON: CT head 03/26/2016 .     CONTRAST: None administered.     PULSE SEQUENCES: Sagittal T1-weighted images, axial T1-weighted images, coronal  T1-weighted images , axial FLAIR and T2 star weighted images, axial diffusion  weighted echo planar images, axial ADC mapping.    FINDINGS: Periventricular foci of T2 and FLAIR hyperintensity are numerous and   more prominent than expected for age. Generalized prominence of cerebral sulci  and ventricles is mildly increased, upper normal for age.    The brain parenchyma and ventricular system otherwise are unremarkable in  appearance for age. No parenchymal mass or hemorrhage and no shift of midline  structures. No extra-axial fluid or blood collection. No additional signal  abnormality. The craniocervical junction is normal for age, as are other midline  structures. Large  vessels appear patent on spin-echo imaging. There is no acute  or subacute ischemia on diffusion weighting. .          Impression IMPRESSION:  1. Periventricular foci of T2 and FLAIR hyperintensity which would be consistent  with microvascular ischemic change but is out of proportion to the patient's  age.. Correlate clinically for other potential causes.  2. No acute or subacute ischemia or other acute intracranial abnormality.       CT Results (most recent):    Results from Hospital Encounter encounter on 03/26/16   CTA CHEST W OR W WO CONT   Narrative INDICATION:  Shortness of breath, assess for pulmonary embolus.    COMPARISON:  12/21/2015.    TECHNIQUE:  Noncontrast images were obtained to localize the pulmonary arteries.    Contiguous axial CT images were then obtained of the chest with 75 mL of  Isovue-370 contrast using the pulmonary angio protocol.    2D post processing was utilized, and coronal and sagittal reformatted images  were obtained.   In addition 3D post processing was utilized and MIP coronal  reconstructed images were obtained.   CT dose reduction was achieved through the use of a standardized protocol  tailored for this examination and automatic exposure control for dose  modulation.    FINDINGS:    No pulmonary embolus is detected.    The thoracic aorta is normal in caliber.    No dissecting aneurysm is seen.    Slight right lower lobe linear stranding is noted.  There is no pericardial effusion or pleural effusion.   Bilateral breast implants are noted.    There are no focal abnormalities detected within the visualized liver, spleen,  pancreas, and adrenal glands.  Degenerative change of the spine is noted.             Impression IMPRESSION:  No pulmonary embolus detected.    No aneurysm detected               Reviewed records in [facility] and media tab today    Lab Review   Results for orders placed or performed during the hospital encounter of 05/21/16   CBC WITH AUTOMATED DIFF   Result Value Ref Range    WBC 5.2 3.6 - 11.0 K/uL    RBC 4.15 3.80 - 5.20 M/uL    HGB 10.8 (L) 11.5 - 16.0 g/dL    HCT 34.3 (L) 35.0 - 47.0 %    MCV 82.7 80.0 - 99.0 FL    MCH 26.0 26.0 - 34.0 PG    MCHC 31.5 30.0 - 36.5 g/dL    RDW 14.7 (  H) 11.5 - 14.5 %    PLATELET 241 150 - 400 K/uL    NEUTROPHILS 51 32 - 75 %    LYMPHOCYTES 34 12 - 49 %    MONOCYTES 10 5 - 13 %    EOSINOPHILS 4 0 - 7 %    BASOPHILS 1 0 - 1 %    ABS. NEUTROPHILS 2.7 1.8 - 8.0 K/UL    ABS. LYMPHOCYTES 1.7 0.8 - 3.5 K/UL    ABS. MONOCYTES 0.5 0.0 - 1.0 K/UL    ABS. EOSINOPHILS 0.2 0.0 - 0.4 K/UL    ABS. BASOPHILS 0.1 0.0 - 0.1 K/UL   METABOLIC PANEL, COMPREHENSIVE   Result Value Ref Range    Sodium 139 136 - 145 mmol/L    Potassium 4.2 3.5 - 5.1 mmol/L    Chloride 102 97 - 108 mmol/L    CO2 28 21 - 32 mmol/L    Anion gap 9 5 - 15 mmol/L    Glucose 97 65 - 100 mg/dL    BUN 15 6 - 20 MG/DL    Creatinine 0.74 0.55 - 1.02 MG/DL    BUN/Creatinine ratio 20 12 - 20      GFR est AA >60 >60 ml/min/1.12m    GFR est non-AA >60 >60 ml/min/1.751m   Calcium 8.5 8.5 - 10.1 MG/DL    Bilirubin, total 0.3 0.2 - 1.0 MG/DL    ALT (SGPT) 21 12 - 78 U/L    AST (SGOT) 12 (L) 15 - 37 U/L    Alk. phosphatase 132 (H) 45 - 117 U/L    Protein, total 7.7 6.4 - 8.2 g/dL    Albumin 3.7 3.5 - 5.0 g/dL    Globulin 4.0 2.0 - 4.0 g/dL    A-G Ratio 0.9 (L) 1.1 - 2.2     PROTHROMBIN TIME + INR   Result Value Ref Range    INR 1.0 0.9 - 1.1      Prothrombin time 9.6 9.0 - 11.1 sec   PTT   Result Value Ref Range     aPTT 26.5 22.1 - 32.5 sec    aPTT, therapeutic range     58.0 - 77.0 SECS   EKG, 12 LEAD, INITIAL   Result Value Ref Range    Ventricular Rate 69 BPM    Atrial Rate 69 BPM    P-R Interval 136 ms    QRS Duration 74 ms    Q-T Interval 414 ms    QTC Calculation (Bezet) 443 ms    Calculated P Axis 42 degrees    Calculated R Axis 18 degrees    Calculated T Axis 41 degrees    Diagnosis       Normal sinus rhythm  Low voltage QRS  When compared with ECG of 26-Mar-2016 13:49,  No significant change was found  Confirmed by SaCammy CopaD. ([id number] 05/23/2016 11:06:07 PM          Exam:  Visit Vitals   ??? BP 110/66   ??? Pulse 83   ??? Resp 18   ??? Ht 5' 3" (1.6 m)   ??? Wt 78.9 kg (174 lb)   ??? SpO2 95%   ??? BMI 30.82 kg/m2     General:  Alert, cooperative, no distress. Tangential historian.   Head:  Normocephalic, without obvious abnormality, atraumatic.   Respiratory:  Heart:   Non labored breathing  Regular rate and rhythm, no murmurs       Extremities: Warm,  no cyanosis or edema.   Pulses: 2+ radial pulses.       Neurologic:  MS: Alert and oriented x 4, speech intact. Language - inact. Attention and fund of knowledge appropriate.  Recent and remote memory intact.  Cranial Nerves:  II: visual fields Full to confrontation   II: pupils    II: optic disc    III,VII: ptosis Mild right ptosis - stable   III,IV,VI: extraocular muscles  EOMI, no nystagmus or diplopia   V: facial light touch sensation     VII: facial muscle function   symmetric   VIII: hearing intact   IX: soft palate elevation     XI: trapezius strength     XI: sternocleidomastoid strength    XII: tongue       Motor: normal bulk and tone, no tremor              Strength: 5/5 throughout, no PD    Coordination: FTN and RAM intact, and HTS intact  Gait: normal gait  Reflexes:              Assessment/Plan   Pt is a 61 y.o. right handed female with h/o radiographic changes c/w MS, but no clinical symptoms, last MRI brain 03/27/16 was stable. Additionally,  has RLS, with an ill-defined irritating sensation in her legs with urge to move her legs beginning around 6PM and keeping her awake and inhibiting her activities, though question if there may be some component of psych issues contributing to her need to walk. Spell of "confusion" reported by pt sounds within normal, especially given multiple stressors. Exam reveals tangential historian, stable right ptosis. Continue Horizant 674m qPM and increase to Requip 2.555mqhs x 1 week, then 58m12mhs.  F/u in clinic in 3 months, instructed to call in the interim if needed.     ICD-10-CM ICD-9-CM    1. RLS (restless legs syndrome) G25.81 333.94    2. Abnormal brain scan R94.02 794.09        Signed:  MarChauncey ReadingD  07/26/2016

## 2016-09-15 ENCOUNTER — Ambulatory Visit
Admit: 2016-09-15 | Discharge: 2016-09-15 | Payer: PRIVATE HEALTH INSURANCE | Attending: Internal Medicine | Primary: Internal Medicine

## 2016-09-15 DIAGNOSIS — E785 Hyperlipidemia, unspecified: Secondary | ICD-10-CM

## 2016-09-15 NOTE — Progress Notes (Signed)
HISTORY OF PRESENT ILLNESS  Lindsay [name] is a 61 y.o. female.  HPI  Lindsay [name] has not been feeling well.  She notes her restless legs have been worsening so she on her own bumped from 3 to 4 Requip recently, which seemed to help a bit.  She is anxious about her daughter, Lindsay [name], who has had a DUI and she is quite worried about what will happen when she gets out of jail.  She does see Dr. Salley HewsSpanier for mental health and is on Diazepam and Lexapro.  He'd recommended adding Wellbutrin, but she is reluctant.  She has a history of DVT in September of 2017 and has been on blood thinners since.  This was a provoked DVT following orthopedic surgery and I think she can come off Xarelto.  She's had a negative doppler study since the original diagnosis.  She was on Pravachol, but states she was told to come off of it and told to come off aspirin.  I'm not sure who would have told her to come off Pravachol, but I asked her to resume it and resume aspirin since we are stopping Xarelto.  She is somewhat confused during our conversation and visit and admits she's not thinking clearly.      Review of Systems   Constitutional: Negative for chills, fever and weight loss.   Respiratory: Negative for cough, shortness of breath and wheezing.    Cardiovascular: Negative for chest pain, palpitations, orthopnea, leg swelling and PND.   Gastrointestinal: Negative for abdominal pain, blood in stool, heartburn and nausea.   Musculoskeletal: Positive for joint pain. Negative for falls and myalgias.   Neurological: Negative for dizziness and headaches.   Psychiatric/Behavioral: The patient is nervous/anxious and has insomnia.        Physical Exam   Constitutional: She is oriented to person, place, and time. She appears well-developed and well-nourished.   HENT:   Head: Normocephalic and atraumatic.   Neck: Normal range of motion. Neck supple. Carotid bruit is not present. No thyromegaly present.    Cardiovascular: Normal rate, regular rhythm, S1 normal, S2 normal, normal heart sounds and intact distal pulses.    No murmur heard.  Pulmonary/Chest: Effort normal and breath sounds normal. No respiratory distress. She has no wheezes. She has no rales.   Musculoskeletal: She exhibits no edema.   Neurological: She is alert and oriented to person, place, and time.   Psychiatric: Her behavior is normal.   Seems anxious and hard to follow in conversation   Nursing note and vitals reviewed.      ASSESSMENT and PLAN  Diagnoses and all orders for this visit:    1. Dyslipidemia, goal LDL below 100  -     METABOLIC PANEL, COMPREHENSIVE  -     LIPID PANEL  -     TSH 3RD GENERATION  -     T4, FREE    2. Abnormal brain scan    3. Anxiety    4. RLS (restless legs syndrome)  -     CBC WITH AUTOMATED DIFF    5. Memory loss      the following changes in treatment are made: stop xarelto as now 6 mo out from provoked dvt-doppler neg ini 10/17  Resume asa   Resume pravachol  Refer back to dr spanier her psychiatrist  Cont with neuro and advised not to inc her requip dose without doctor's advice  I have left a message with dr spanier to try to connect  with him about my concerns about her mental status

## 2016-09-16 LAB — CBC WITH AUTOMATED DIFF
ABS. BASOPHILS: 0 10*3/uL (ref 0.0–0.2)
ABS. EOSINOPHILS: 0.2 10*3/uL (ref 0.0–0.4)
ABS. IMM. GRANS.: 0 10*3/uL (ref 0.0–0.1)
ABS. MONOCYTES: 0.4 10*3/uL (ref 0.1–0.9)
ABS. NEUTROPHILS: 3.5 10*3/uL (ref 1.4–7.0)
Abs Lymphocytes: 1.8 10*3/uL (ref 0.7–3.1)
BASOPHILS: 0 %
EOSINOPHILS: 3 %
HCT: 31.9 % — ABNORMAL LOW (ref 34.0–46.6)
HGB: 10.6 g/dL — ABNORMAL LOW (ref 11.1–15.9)
IMMATURE GRANULOCYTES: 0 %
Lymphocytes: 31 %
MCH: 25.1 pg — ABNORMAL LOW (ref 26.6–33.0)
MCHC: 33.2 g/dL (ref 31.5–35.7)
MCV: 75 fL — ABNORMAL LOW (ref 79–97)
MONOCYTES: 7 %
NEUTROPHILS: 59 %
PLATELET: 281 10*3/uL (ref 150–379)
RBC: 4.23 x10E6/uL (ref 3.77–5.28)
RDW: 15.3 % (ref 12.3–15.4)
WBC: 5.9 10*3/uL (ref 3.4–10.8)

## 2016-09-16 LAB — LIPID PANEL
Cholesterol, total: 243 mg/dL — ABNORMAL HIGH (ref 100–199)
HDL Cholesterol: 48 mg/dL (ref >39)
LDL, calculated: 159 mg/dL — ABNORMAL HIGH (ref 0–99)
Triglyceride: 178 mg/dL — ABNORMAL HIGH (ref 0–149)
VLDL, calculated: 36 mg/dL (ref 5–40)

## 2016-09-16 LAB — METABOLIC PANEL, COMPREHENSIVE
A-G Ratio: 1.8 (ref 1.2–2.2)
ALT (SGPT): 18 IU/L (ref 0–32)
AST (SGOT): 29 IU/L (ref 0–40)
Albumin: 4.5 g/dL (ref 3.6–4.8)
Alk. phosphatase: 147 IU/L — ABNORMAL HIGH (ref 39–117)
BUN/Creatinine ratio: 12 (ref 12–28)
BUN: 10 mg/dL (ref 8–27)
Bilirubin, total: 0.2 mg/dL (ref 0.0–1.2)
CO2: 23 mmol/L (ref 18–29)
Calcium: 9.7 mg/dL (ref 8.7–10.3)
Chloride: 103 mmol/L (ref 96–106)
Creatinine: 0.81 mg/dL (ref 0.57–1.00)
GFR est AA: 90 mL/min/{1.73_m2} (ref >59)
GFR est non-AA: 78 mL/min/{1.73_m2} (ref >59)
GLOBULIN, TOTAL: 2.5 g/dL (ref 1.5–4.5)
Glucose: 93 mg/dL (ref 65–99)
Potassium: 4.2 mmol/L (ref 3.5–5.2)
Protein, total: 7 g/dL (ref 6.0–8.5)
Sodium: 144 mmol/L (ref 134–144)

## 2016-09-16 LAB — TSH 3RD GENERATION: TSH: 2.08 u[IU]/mL (ref 0.450–4.500)

## 2016-09-16 LAB — T4, FREE: T4, Free: 1.11 ng/dL (ref 0.82–1.77)

## 2016-09-16 LAB — CVD REPORT

## 2016-09-16 NOTE — Progress Notes (Signed)
Patient notified of lab results & recommendations per PCP. Provided her names of gi doctors to schedule an appt with for the persistent anemia.

## 2016-09-16 NOTE — Progress Notes (Signed)
I do want her to resume the pravachol as we discussed in the office to help her xerol  I do think she can stop xarelto and want her to see gi for the persistent anemia-appt in next month  See me in 3 months

## 2016-09-26 MED ORDER — ROPINIROLE 1 MG TAB
1 mg | ORAL_TABLET | ORAL | 1 refills | Status: DC
Start: 2016-09-26 — End: 2016-10-06

## 2016-09-28 ENCOUNTER — Telehealth

## 2016-09-28 MED ORDER — PRAVASTATIN 40 MG TAB
40 mg | ORAL_TABLET | Freq: Every evening | ORAL | 0 refills | Status: DC
Start: 2016-09-28 — End: 2016-12-30

## 2016-09-28 NOTE — Telephone Encounter (Signed)
Refill for pravastatin has been sent to patient's pharmacy on file.

## 2016-09-28 NOTE — Telephone Encounter (Signed)
-----   Message from Ashok Palliffany N Robinson sent at 09/28/2016  9:18 AM EDT -----  Regarding: Dr. Sabra HeckGlynn/Telephone  Pt is calling to check status of cholesterol medication. She stated she forgot the name and couldn't remember if she received a written script or if it was sent electronically.The best contact is [phone].

## 2016-10-05 NOTE — Telephone Encounter (Signed)
Patient is asking that the nurse calls her regarding a Blood Clot behind her Right Knee and on the side. Would like a order for Doppler instead of coming in. Her no is [phone]

## 2016-10-05 NOTE — Telephone Encounter (Signed)
Returned pt call and advised of appt needed to imaging order. Inquired on pt symptoms. Pt is having soreness, tenderness behind knee, not swelling or discoloration as she can tell. Pt has history of blood clots and is concerned that she may have another but feels silly when she believes this and it is all clear. Encouraged pt to always seeks treatment if she feels she has another clot. Pt declined appt today but did agree to come in tomorrow. Appt provide. Advised pt that if symptoms change to be seen in ED. Pt understood and agrees to plan.

## 2016-10-06 ENCOUNTER — Ambulatory Visit
Admit: 2016-10-06 | Discharge: 2016-10-06 | Payer: PRIVATE HEALTH INSURANCE | Attending: Internal Medicine | Primary: Internal Medicine

## 2016-10-06 DIAGNOSIS — M79604 Pain in right leg: Secondary | ICD-10-CM

## 2016-10-06 NOTE — Progress Notes (Signed)
Lindsay [name] is a 61 y.o. female who presents today for Leg Pain  .      She has a history of   Patient Active Problem List   Diagnosis Code   ??? Depression F32.9   ??? Headache(784.0) R51   ??? Chest pain R07.9   ??? Pulmonary embolism (HCC) I26.99   ??? Dyslipidemia, goal LDL below 100 E78.5   ??? Anxiety F41.9   ??? Abnormal brain scan R94.02   ??? Primary osteoarthritis of left knee M17.12   ??? Primary osteoarthritis of right knee M17.11   ??? RLS (restless legs syndrome) G25.81   ??? Paresthesia of lower lip R20.2   .    Today patient is here for an acute visit.   she does not have other concerns.    Problem visit:  Lindsay [name] is here for complaint of R LE pain.  Problem began 4 day(s) ago.  Severity is mild  Character of problem: some pain.   Patient had R TKA in June and had a clot.   Pt notes that she is nervous about this.   Pt taken off anticoagulation on March 14th.   Notes that she is having pain with exertion and rest.   No swelling or warmth.       ROS  Review of Systems   Constitutional: Negative for chills, fever and weight loss.   Respiratory: Negative for cough, hemoptysis, sputum production and shortness of breath.    Cardiovascular: Positive for leg swelling (Resolved today). Negative for chest pain and palpitations.   Gastrointestinal: Negative for abdominal pain, nausea and vomiting.   Musculoskeletal: Positive for joint pain (R knee).   Neurological: Negative.    Endo/Heme/Allergies: Does not bruise/bleed easily.   Psychiatric/Behavioral: Negative for depression. The patient is not nervous/anxious.        Visit Vitals   ??? BP 106/68 (BP 1 Location: Left arm, BP Patient Position: Sitting)   ??? Pulse 78   ??? Temp 98.4 ??F (36.9 ??C) (Oral)   ??? Resp 16   ??? Ht  (1.6 m)   ??? Wt 176 lb (79.8 kg)   ??? SpO2 93%   ??? BMI 31.18 kg/m2       Physical Exam   Constitutional: She is oriented to person, place, and time. She appears well-developed and well-nourished.   HENT:   Head: Normocephalic and atraumatic.    Cardiovascular: Normal rate and regular rhythm.  Exam reveals no friction rub.    No murmur heard.  Pulmonary/Chest: Effort normal. No respiratory distress.   Musculoskeletal:   No swelling to R leg. Scars from TKA to both knees. No edema or warmth. No pain to palpation.    Neurological: She is alert and oriented to person, place, and time.   Skin: Skin is warm and dry.   Psychiatric: She has a normal mood and affect. Her behavior is normal.         Current Outpatient Prescriptions   Medication Sig   ??? pravastatin (PRAVACHOL) 40 mg tablet Take 1 Tab by mouth nightly.   ??? diazePAM (VALIUM) 5 mg tablet Take 1 Tab by mouth three (3) times daily.   ??? rOPINIRole (REQUIP) 1 mg tablet Take 2.5 tabs by mouth 1-3 hours before bedtime x 1 week, then 3 tabs 1-3 hours before bedtime. (Patient taking differently: Take 3 tabs by mouth 1-3 hours before bedtime)   ??? gabapentin enacarbil (HORIZANT) 600 mg TbER Take 1 Tab by mouth every  evening.   ??? aspirin 81 mg chewable tablet Take 1 Tab by mouth daily.   ??? ergocalciferol (ERGOCALCIFEROL) 50,000 unit capsule Take 1 Cap by mouth every seven (7) days.   ??? escitalopram oxalate (LEXAPRO) 20 mg tablet Take 1 Tab by mouth daily.   ??? omeprazole (PRILOSEC) 20 mg capsule Take 20 mg by mouth nightly.     No current facility-administered medications for this visit.         Past Medical History:   Diagnosis Date   ??? Abnormal brain scan    ??? Arthritis    ??? Chronic pain    ??? Depression    ??? DVT (deep venous thrombosis) (HCC)    ??? Falls frequently    ??? Fracture of left ankle    ??? Headache(784.0) 2011   ??? Ill-defined condition 09/15/2015    FX R HAND   ??? Other and unspecified hyperlipidemia 12/27/2012   ??? PE (pulmonary embolism) 12/2012   ??? RLS (restless legs syndrome) 03/16/2016    Dr Scarlett Presto   ??? TIA (transient ischemic attack) 03/2016    Right facial droop, numbness in lip   ??? Torn rotator cuff     right      Past Surgical History:   Procedure Laterality Date    ??? BREAST SURGERY PROCEDURE UNLISTED  2002    AUGMENTATION   ??? HX APPENDECTOMY     ??? HX CESAREAN SECTION      x4   ??? HX GI      COLONOSCOPY   ??? 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 HX ORTHOPAEDIC  08/2004    rotater cuff r. shoulder   ??? HX ORTHOPAEDIC      left ankle fracture treated with air cast   ??? HX ORTHOPAEDIC  6/13    left shoulder fxt and dislocation   ??? HX OTHER SURGICAL  2010    left foot torn tendon   ??? HX TUBAL LIGATION        Social History   Substance Use Topics   ??? Smoking status: Never Smoker   ??? Smokeless tobacco: Never Used   ??? Alcohol use 8.4 oz/week     14 Cans of beer per week      Comment: 2 MICHELOB ULTRA PER DAY      Family History   Problem Relation Age of Onset   ??? Heart Disease Mother    ??? Heart Disease Father    ??? Cancer Father      thyroid cancer   ??? Arthritis-rheumatoid Sister    ??? No Known Problems Son    ??? No Known Problems Daughter    ??? No Known Problems Daughter    ??? No Known Problems Daughter    ??? Anesth Problems Neg Hx         No Known Allergies     Assessment/Plan    Diagnoses and all orders for this visit:    1. Pain of right lower extremity - low concern for clot, but given history and just taken off xarelto, check [place] of R leg. Not to be done stat.   -     DUPLEX LOWER EXT VENOUS RIGHT; Future    2. History of DVT in adulthood  -     DUPLEX LOWER EXT VENOUS RIGHT; Future        Follow-up Disposition: Not on File    Cordella Register, MD  10/06/2016

## 2016-10-15 ENCOUNTER — Ambulatory Visit: Payer: BLUE CROSS/BLUE SHIELD | Primary: Internal Medicine

## 2016-10-25 ENCOUNTER — Encounter: Attending: Neurology | Primary: Internal Medicine

## 2016-10-26 NOTE — Telephone Encounter (Signed)
-----   Message from Bonne Dolores sent at 10/26/2016  8:54 AM EDT -----  Regarding: Dr. Latina Craver  Pt called concerning speaking with the doctor pertaining to her changing her medications on her on and would like a refill on one of her medications as well and would like to speak with a nurse if possible. Best contact number [phone]) [phone].

## 2016-10-27 MED ORDER — ROPINIROLE 1 MG TAB
1 mg | ORAL_TABLET | ORAL | 2 refills | Status: DC
Start: 2016-10-27 — End: 2017-02-12

## 2016-10-27 NOTE — Telephone Encounter (Signed)
Pts appt with you for Monday was cx and she has rescheduled for 7/3.    She wanted to discuss her Requip with you.  She has had to increase her dose to  at night in order to get any relief with her legs.  She did this about a month ago and it has helped.    She is asking for a refill at the increased dose if you will do so.(and she says she is sorry but she was so uncomfortable )    I have pre-loaded the med if you want to send it.

## 2016-12-06 ENCOUNTER — Inpatient Hospital Stay
Admit: 2016-12-06 | Discharge: 2016-12-06 | Disposition: A | Discharge status: Home / Self Care | Payer: BLUE CROSS/BLUE SHIELD | Attending: Emergency Medicine

## 2016-12-06 ENCOUNTER — Emergency Department: Admit: 2016-12-06 | Payer: BLUE CROSS/BLUE SHIELD | Primary: Internal Medicine

## 2016-12-06 DIAGNOSIS — R079 Chest pain, unspecified: Secondary | ICD-10-CM

## 2016-12-06 LAB — EKG 12-LEAD
Atrial Rate: 68 {beats}/min
Diagnosis: NORMAL
P Axis: 33 degrees
P-R Interval: 142 ms
Q-T Interval: 400 ms
QRS Duration: 72 ms
QTc Calculation (Bazett): 425 ms
R Axis: 20 degrees
T Axis: 43 degrees
Ventricular Rate: 68 {beats}/min

## 2016-12-06 LAB — D-DIMER, QUANTITATIVE: D-Dimer, Quant: 2.24 mg/L FEU — ABNORMAL HIGH (ref 0.00–0.65)

## 2016-12-06 LAB — METABOLIC PANEL, COMPREHENSIVE
A-G Ratio: 1 — ABNORMAL LOW (ref 1.1–2.2)
ALT (SGPT): 22 U/L (ref 12–78)
AST (SGOT): 17 U/L (ref 15–37)
Albumin: 3.9 g/dL (ref 3.5–5.0)
Alk. phosphatase: 155 U/L — ABNORMAL HIGH (ref 45–117)
Anion gap: 7 mmol/L (ref 5–15)
BUN/Creatinine ratio: 19 (ref 12–20)
BUN: 16 MG/DL (ref 6–20)
Bilirubin, total: 0.2 MG/DL (ref 0.2–1.0)
CO2: 27 mmol/L (ref 21–32)
Calcium: 9.3 MG/DL (ref 8.5–10.1)
Chloride: 105 mmol/L (ref 97–108)
Creatinine: 0.86 MG/DL (ref 0.55–1.02)
GFR est AA: >60 mL/min/{1.73_m2} (ref >60)
GFR est non-AA: >60 mL/min/{1.73_m2} (ref >60)
Globulin: 4.1 g/dL — ABNORMAL HIGH (ref 2.0–4.0)
Glucose: 97 mg/dL (ref 65–100)
Potassium: 4.2 mmol/L (ref 3.5–5.1)
Protein, total: 8 g/dL (ref 6.4–8.2)
Sodium: 139 mmol/L (ref 136–145)

## 2016-12-06 LAB — EKG, 12 LEAD, INITIAL
Atrial Rate: 68 {beats}/min
Calculated P Axis: 33 degrees
Calculated R Axis: 20 degrees
Calculated T Axis: 43 degrees
Diagnosis: NORMAL
P-R Interval: 142 ms
Q-T Interval: 400 ms
QRS Duration: 72 ms
QTC Calculation (Bezet): 425 ms
Ventricular Rate: 68 {beats}/min

## 2016-12-06 LAB — URINALYSIS W/ REFLEX CULTURE
Bacteria: NEGATIVE /hpf
Bilirubin: NEGATIVE
Blood: NEGATIVE
Glucose: NEGATIVE mg/dL
Ketone: NEGATIVE mg/dL
Leukocyte Esterase: NEGATIVE
Nitrites: NEGATIVE
Protein: NEGATIVE mg/dL
Specific gravity: 1.026 (ref 1.003–1.030)
Urobilinogen: 0.2 EU/dL (ref 0.2–1.0)
pH (UA): 7 (ref 5.0–8.0)

## 2016-12-06 LAB — CBC WITH AUTOMATED DIFF
ABS. BASOPHILS: 0.1 10*3/uL (ref 0.0–0.1)
ABS. EOSINOPHILS: 0.3 10*3/uL (ref 0.0–0.4)
ABS. IMM. GRANS.: 0 10*3/uL (ref 0.00–0.04)
ABS. LYMPHOCYTES: 2.2 10*3/uL (ref 0.8–3.5)
ABS. MONOCYTES: 0.6 10*3/uL (ref 0.0–1.0)
ABS. NEUTROPHILS: 4.6 10*3/uL (ref 1.8–8.0)
ABSOLUTE NRBC: 0 10*3/uL (ref 0.00–0.01)
BASOPHILS: 1 % (ref 0–1)
EOSINOPHILS: 3 % (ref 0–7)
HCT: 33.6 % — ABNORMAL LOW (ref 35.0–47.0)
HGB: 10.4 g/dL — ABNORMAL LOW (ref 11.5–16.0)
IMMATURE GRANULOCYTES: 0 % (ref 0.0–0.5)
LYMPHOCYTES: 28 % (ref 12–49)
MCH: 23.9 PG — ABNORMAL LOW (ref 26.0–34.0)
MCHC: 31 g/dL (ref 30.0–36.5)
MCV: 77.2 FL — ABNORMAL LOW (ref 80.0–99.0)
MONOCYTES: 7 % (ref 5–13)
MPV: 8.9 FL (ref 8.9–12.9)
NEUTROPHILS: 60 % (ref 32–75)
NRBC: 0 PER 100 WBC
PLATELET: 259 10*3/uL (ref 150–400)
RBC: 4.35 M/uL (ref 3.80–5.20)
RDW: 14.6 % — ABNORMAL HIGH (ref 11.5–14.5)
WBC: 7.7 10*3/uL (ref 3.6–11.0)

## 2016-12-06 LAB — NT-PRO BNP: NT pro-BNP: 88 PG/ML (ref 0–125)

## 2016-12-06 LAB — CK W/ CKMB & INDEX
CK - MB: 2.8 NG/ML (ref <3.6)
CK-MB Index: 1.3 (ref 0–2.5)
CK: 219 U/L — ABNORMAL HIGH (ref 26–192)

## 2016-12-06 LAB — TROPONIN I: Troponin-I, Qt.: <0.04 ng/mL (ref <0.05)

## 2016-12-06 LAB — D DIMER: D-dimer: 2.24 mg/L FEU — ABNORMAL HIGH (ref 0.00–0.65)

## 2016-12-06 MED ORDER — FENTANYL CITRATE (PF) 50 MCG/ML IJ SOLN
50 mcg/mL | INTRAMUSCULAR | Status: DC
Start: 2016-12-06 — End: 2016-12-06

## 2016-12-06 MED ORDER — SODIUM CHLORIDE 0.9% BOLUS IV
0.9 % | INTRAVENOUS | Status: DC
Start: 2016-12-06 — End: 2016-12-06

## 2016-12-06 MED ORDER — SODIUM CHLORIDE 0.9 % IJ SYRG
Freq: Once | INTRAMUSCULAR | Status: AC
Start: 2016-12-06 — End: 2016-12-06
  Administered 2016-12-06: 18:00:00 via INTRAVENOUS

## 2016-12-06 MED ORDER — IOPAMIDOL 76 % IV SOLN
370 mg iodine /mL (76 %) | Freq: Once | INTRAVENOUS | Status: AC
Start: 2016-12-06 — End: 2016-12-06
  Administered 2016-12-06: 18:00:00 via INTRAVENOUS

## 2016-12-06 MED ORDER — ONDANSETRON (PF) 4 MG/2 ML INJECTION
4 mg/2 mL | INTRAMUSCULAR | Status: DC
Start: 2016-12-06 — End: 2016-12-06

## 2016-12-06 MED ORDER — TRAMADOL 50 MG TAB
50 mg | ORAL_TABLET | Freq: Four times a day (QID) | ORAL | 0 refills | Status: DC | PRN
Start: 2016-12-06 — End: 2017-01-12

## 2016-12-06 MED ORDER — SODIUM CHLORIDE 0.9 % IV
Freq: Once | INTRAVENOUS | Status: AC
Start: 2016-12-06 — End: 2016-12-06
  Administered 2016-12-06: 18:00:00 via INTRAVENOUS

## 2016-12-06 MED FILL — SODIUM CHLORIDE 0.9 % IV: INTRAVENOUS | Qty: 1000

## 2016-12-06 MED FILL — ISOVUE-370  76 % INTRAVENOUS SOLUTION: 370 mg iodine /mL (76 %) | INTRAVENOUS | Qty: 100

## 2016-12-06 NOTE — ED Provider Notes (Signed)
EMERGENCY DEPARTMENT HISTORY AND PHYSICAL EXAM      EXPRESS CARE NOTE:  11:27 AM  I have seen and evaluated this patient in the Express Care portion of triage for chest pain radiating into the upper back alongside SOB with concerns for PE given history. The patients care will begin now and orders have been placed. This patient will be seen and provided further care in the Emergency Room.  Written by Lianne Moris, a scribe for Debbra Riding, PA-C.    Date: 12/06/2016  Patient Name: Lindsay [name]    History of Presenting Illness     Chief Complaint   Patient presents with   ??? Chest Pain     woke up with cp and upper back pain with sob.  hx of PE.       History Provided By: Patient    HPI: Lindsay [name], 61 y.o. female with PMHx significant for PE/DVT x5, depression, and TIA, presents ambulatory to the ED with cc of an acute onset of sharp diffuse CP radiating through to her mid back progressively worsening since waking up this morning. Pt reports associated sx of SOB, dyspnea, mild nausea, lightheadedness, dry cough and chills as well. She expresses upon waking up this morning she began with 5/10 sharp chest pain radiating through to her back noting she has had a hard time with catching her breath also noting some dyspnea. Pt discloses a h/o DVT/PE x5 and became worried leading her to the ED for evaluation. Of note pt denies being on any anticoagulation. She denies any exacerbating or relieving factors to her pain. She denies any fevers, abdominal pain, nausea, vomiting, or diarrhea.       There are no other complaints, changes, or physical findings at this time.    PCP: Flora Lipps, MD   SHx: (-) Tobacco; (+) ETOH; (-) Illicit drug use    Current Facility-Administered Medications   Medication Dose Route Frequency Provider Last Rate Last Dose   ??? fentaNYL citrate (PF) injection 25 mcg  25 mcg IntraVENous NOW Seward Speck, MD       ??? ondansetron (ZOFRAN) injection 4 mg  4 mg IntraVENous NOW Seward Speck, MD       ??? sodium chloride 0.9 % bolus infusion 1,000 mL  1,000 mL IntraVENous NOW Seward Speck, MD         Current Outpatient Prescriptions   Medication Sig Dispense Refill   ??? traMADol (ULTRAM) 50 mg tablet Take 1 Tab by mouth every six (6) hours as needed for Pain. Max Daily Amount: 200 mg. 20 Tab 0   ??? rOPINIRole (REQUIP) 1 mg tablet Take 4 tabs 1-3 hours before bedtime. 120 Tab 2   ??? pravastatin (PRAVACHOL) 40 mg tablet Take 1 Tab by mouth nightly. 90 Tab 0   ??? diazePAM (VALIUM) 5 mg tablet Take 1 Tab by mouth three (3) times daily.  5   ??? gabapentin enacarbil (HORIZANT) 600 mg TbER Take 1 Tab by mouth every evening. 30 Tab 11   ??? aspirin 81 mg chewable tablet Take 1 Tab by mouth daily. 30 Tab 0   ??? ergocalciferol (ERGOCALCIFEROL) 50,000 unit capsule Take 1 Cap by mouth every seven (7) days. 12 Cap 1   ??? escitalopram oxalate (LEXAPRO) 20 mg tablet Take 1 Tab by mouth daily. 30 Tab 5   ??? omeprazole (PRILOSEC) 20 mg capsule Take 20 mg by mouth nightly.         Past History  Past Medical History:  Past Medical History:   Diagnosis Date   ??? Abnormal brain scan    ??? Arthritis    ??? Chronic pain    ??? Depression    ??? DVT (deep venous thrombosis) ([place])    ??? Falls frequently    ??? Fracture of left ankle    ??? Headache(784.0) 2011   ??? Ill-defined condition 09/15/2015    FX R HAND   ??? Other and unspecified hyperlipidemia 12/27/2012   ??? PE (pulmonary embolism) 12/2012   ??? RLS (restless legs syndrome) 03/16/2016    Dr Roseanne Reno   ??? TIA (transient ischemic attack) 03/2016    Right facial droop, numbness in lip   ??? Torn rotator cuff     right       Past Surgical History:  Past Surgical History:   Procedure Laterality Date   ??? BREAST SURGERY PROCEDURE UNLISTED  2002    AUGMENTATION   ??? HX APPENDECTOMY     ??? HX CESAREAN SECTION      x4   ??? HX GI      COLONOSCOPY   ??? 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 HX ORTHOPAEDIC  08/2004    rotater cuff r. shoulder   ??? HX ORTHOPAEDIC       left ankle fracture treated with air cast   ??? HX ORTHOPAEDIC  6/13    left shoulder fxt and dislocation   ??? HX OTHER SURGICAL  2010    left foot torn tendon   ??? HX TUBAL LIGATION         Family History:  Family History   Problem Relation Age of Onset   ??? Heart Disease Mother    ??? Heart Disease Father    ??? Cancer Father      thyroid cancer   ??? Arthritis-rheumatoid Sister    ??? No Known Problems Son    ??? No Known Problems Daughter    ??? No Known Problems Daughter    ??? No Known Problems Daughter    ??? Anesth Problems Neg Hx        Social History:  Social History   Substance Use Topics   ??? Smoking status: Never Smoker   ??? Smokeless tobacco: Never Used   ??? Alcohol use 8.4 oz/week     14 Cans of beer per week      Comment: 2 MICHELOB ULTRA PER DAY       Allergies:  No Known Allergies      Review of Systems   Review of Systems   Constitutional: Positive for chills. Negative for diaphoresis and fever.   HENT: Negative for congestion, ear pain, rhinorrhea, sneezing and sore throat.    Eyes: Negative for redness and visual disturbance.   Respiratory: Positive for cough (dry ) and shortness of breath.         (+) dyspnea    Cardiovascular: Positive for chest pain (diffuse radiating into back ). Negative for leg swelling.   Gastrointestinal: Positive for nausea. Negative for abdominal pain and vomiting.   Endocrine: Negative for polyuria.   Genitourinary: Negative for difficulty urinating and frequency.   Musculoskeletal: Positive for back pain (mid back ). Negative for myalgias (BL legs ) and neck stiffness.   Skin: Negative for rash.   Allergic/Immunologic: Negative for immunocompromised state.   Neurological: Positive for light-headedness. Negative for dizziness, syncope, weakness, numbness and headaches.   Hematological: Negative for adenopathy.   Psychiatric/Behavioral: Negative.  All other systems reviewed and are negative.      Physical Exam   Physical Exam    Constitutional: She is oriented to person, place, and time. She appears well-developed and well-nourished. No distress (no acute distress ).   HENT:   Head: Normocephalic and atraumatic.   Eyes: EOM are normal. Pupils are equal, round, and reactive to light.   Neck: Normal range of motion. Neck supple.   Cardiovascular: Normal rate, regular rhythm and normal heart sounds.    Pulmonary/Chest: Effort normal and breath sounds normal. No respiratory distress. She exhibits no tenderness.   Abdominal: Soft. Bowel sounds are normal. She exhibits no mass. There is no tenderness.   Musculoskeletal: Normal range of motion. She exhibits no edema or tenderness.   BL lower extremities non tender, no edema   Neurological: She is alert and oriented to person, place, and time. Coordination normal.   Skin: Skin is warm and dry.   Psychiatric: She has a normal mood and affect. Her behavior is normal.   Nursing note and vitals reviewed.      Diagnostic Study Results     Labs -     Recent Results (from the past 12 hour(s))   EKG, 12 LEAD, INITIAL    Collection Time: 12/06/16 10:50 AM   Result Value Ref Range    Ventricular Rate 68 BPM    Atrial Rate 68 BPM    P-R Interval 142 ms    QRS Duration 72 ms    Q-T Interval 400 ms    QTC Calculation (Bezet) 425 ms    Calculated P Axis 33 degrees    Calculated R Axis 20 degrees    Calculated T Axis 43 degrees    Diagnosis       Normal sinus rhythm  Low voltage QRS  When compared with ECG of 21-May-2016 13:37,  No significant change was found     METABOLIC PANEL, COMPREHENSIVE    Collection Time: 12/06/16 11:22 AM   Result Value Ref Range    Sodium 139 136 - 145 mmol/L    Potassium 4.2 3.5 - 5.1 mmol/L    Chloride 105 97 - 108 mmol/L    CO2 27 21 - 32 mmol/L    Anion gap 7 5 - 15 mmol/L    Glucose 97 65 - 100 mg/dL    BUN 16 6 - 20 MG/DL    Creatinine 0.86 0.55 - 1.02 MG/DL    BUN/Creatinine ratio 19 12 - 20      GFR est AA >60 >60 ml/min/1.27m    GFR est non-AA >60 >60 ml/min/1.785m    Calcium 9.3 8.5 - 10.1 MG/DL    Bilirubin, total 0.2 0.2 - 1.0 MG/DL    ALT (SGPT) 22 12 - 78 U/L    AST (SGOT) 17 15 - 37 U/L    Alk. phosphatase 155 (H) 45 - 117 U/L    Protein, total 8.0 6.4 - 8.2 g/dL    Albumin 3.9 3.5 - 5.0 g/dL    Globulin 4.1 (H) 2.0 - 4.0 g/dL    A-G Ratio 1.0 (L) 1.1 - 2.2     CBC WITH AUTOMATED DIFF    Collection Time: 12/06/16 11:22 AM   Result Value Ref Range    WBC 7.7 3.6 - 11.0 K/uL    RBC 4.35 3.80 - 5.20 M/uL    HGB 10.4 (L) 11.5 - 16.0 g/dL    HCT 33.6 (L) 35.0 - 47.0 %  MCV 77.2 (L) 80.0 - 99.0 FL    MCH 23.9 (L) 26.0 - 34.0 PG    MCHC 31.0 30.0 - 36.5 g/dL    RDW 14.6 (H) 11.5 - 14.5 %    PLATELET 259 150 - 400 K/uL    MPV 8.9 8.9 - 12.9 FL    NRBC 0.0 0 PER 100 WBC    ABSOLUTE NRBC 0.00 0.00 - 0.01 K/uL    NEUTROPHILS 60 32 - 75 %    LYMPHOCYTES 28 12 - 49 %    MONOCYTES 7 5 - 13 %    EOSINOPHILS 3 0 - 7 %    BASOPHILS 1 0 - 1 %    IMMATURE GRANULOCYTES 0 0.0 - 0.5 %    ABS. NEUTROPHILS 4.6 1.8 - 8.0 K/UL    ABS. LYMPHOCYTES 2.2 0.8 - 3.5 K/UL    ABS. MONOCYTES 0.6 0.0 - 1.0 K/UL    ABS. EOSINOPHILS 0.3 0.0 - 0.4 K/UL    ABS. BASOPHILS 0.1 0.0 - 0.1 K/UL    ABS. IMM. GRANS. 0.0 0.00 - 0.04 K/UL    DF AUTOMATED     D DIMER    Collection Time: 12/06/16 11:22 AM   Result Value Ref Range    D-dimer 2.24 (H) 0.00 - 0.65 mg/L FEU   TROPONIN I    Collection Time: 12/06/16 11:22 AM   Result Value Ref Range    Troponin-I, Qt. <0.04 <0.05 ng/mL   CK W/ CKMB & INDEX    Collection Time: 12/06/16 11:22 AM   Result Value Ref Range    CK 219 (H) 26 - 192 U/L    CK - MB 2.8 <3.6 NG/ML    CK-MB Index 1.3 0 - 2.5     URINALYSIS W/ REFLEX CULTURE    Collection Time: 12/06/16 11:22 AM   Result Value Ref Range    Color YELLOW/STRAW      Appearance CLEAR CLEAR      Specific gravity 1.026 1.003 - 1.030      pH (UA) 7.0 5.0 - 8.0      Protein NEGATIVE  NEG mg/dL    Glucose NEGATIVE  NEG mg/dL    Ketone NEGATIVE  NEG mg/dL    Bilirubin NEGATIVE  NEG      Blood NEGATIVE  NEG       Urobilinogen 0.2 0.2 - 1.0 EU/dL    Nitrites NEGATIVE  NEG      Leukocyte Esterase NEGATIVE  NEG      WBC 0-4 0 - 4 /hpf    RBC 0-5 0 - 5 /hpf    Epithelial cells FEW FEW /lpf    Bacteria NEGATIVE  NEG /hpf    UA:UC IF INDICATED CULTURE NOT INDICATED BY UA RESULT CNI     NT-PRO BNP    Collection Time: 12/06/16 11:22 AM   Result Value Ref Range    NT pro-BNP 88 0 - 125 PG/ML       Radiologic Studies -     CT Results  (Last 48 hours)               12/06/16 1342  CTA CHEST W OR W WO CONT Final result    Impression:  IMPRESSION: No evidence of pulmonary embolism or acute abnormality.       Narrative:  EXAM:  CTA CHEST W OR W WO CONT       INDICATION:   Acute chest pain, upper back pain, and shortness  of breath today       COMPARISON: 03/26/2016.       TECHNIQUE:    Precontrast scout images were obtained to localize the volume for acquisition.   Multislice helical CT arteriography was performed from the diaphragm to the   thoracic inlet during uneventful rapid bolus of 80 cc Isovue-370. Lung and soft   tissue windows were generated.  Coronal and sagittal images were generated and   3D post processing consisting of coronal maximum intensity images was performed.     CT dose reduction was achieved through use of a standardized protocol tailored   for this examination and automatic exposure control for dose modulation.         FINDINGS:   CHEST:   THYROID: No nodule.   MEDIASTINUM: No mass or lymphadenopathy.   HILA: No mass or lymphadenopathy.   THORACIC AORTA: No dissection or aneurysm.   MAIN PULMONARY ARTERY: No evidence of pulmonary embolism.   TRACHEA/BRONCHI: Patent.   ESOPHAGUS: No wall thickening or dilatation.   HEART: Normal in size.   PLEURA: No effusion or pneumothorax.   LUNGS: No nodule, mass, or airspace disease.   INCIDENTALLY IMAGED UPPER ABDOMEN: No focal abnormality.   BONES: Degenerative changes are seen in the thoracic spine.                   CXR Results  (Last 48 hours)                12/06/16 1134  XR CHEST PA LAT Final result    Impression:  IMPRESSION: No acute abnormality and no change.           Narrative:  EXAM:  XR CHEST PA LAT.   INDICATION: Chest pain.   COMPARISON: 05/04/2010.       FINDINGS:    PA and lateral radiographs of the chest were obtained.   Lungs: The lungs are clear of mass, nodule, airspace disease or edema.   Pleura: There is no pleural effusion or pneumothorax.   Mediastinum: The cardiac and mediastinal contours and pulmonary vascularity are   normal.   Bones and soft tissues: The bones and soft tissues are within normal limits.                   Medical Decision Making   I am the first provider for this patient.    I reviewed the vital signs, available nursing notes, past medical history, past surgical history, family history and social history.    Vital Signs-Reviewed the patient's vital signs.  Patient Vitals for the past 12 hrs:   Temp Pulse Resp BP SpO2   12/06/16 1047 98 ??F (36.7 ??C) 84 18 131/73 98 %       Pulse Oximetry Analysis - 98% on RA    Cardiac Monitor:   Rate: 84 bpm  Rhythm: Normal Sinus Rhythm      EKG interpretation: (Preliminary) 1050  Rhythm: normal sinus rhythm; and regular . Rate (approx.): 68; Axis: normal; PR interval: normal; QRS interval: normal ; ST/T wave: normal; Other findings: normal.    Records Reviewed: Nursing Notes and Old Medical Records    Provider Notes (Medical Decision Making):   DDx:PE, CAD, anxiety, CHF,pleuresy, reflux, musculoskeletal    ED Course:   Initial assessment performed. The patients presenting problems have been discussed, and they are in agreement with the care plan formulated and outlined with them.  I have encouraged them to ask questions as they arise  throughout their visit.    Progress Notes:    3:04 PM   The pt has been re-evaluated. She expresses her sx have improved with tx in the ED. Will ambulate the pt with pulse ox at this time.     Progress Note:  3:05 PM   The patients condition has improved. Will ambulate with pulse oxymetry reading.     Progress Note:  4:05 PM  Discussed results and plans for discharge. Discussed follow up plans and reasons to return to the ED. Pt understands and is agreeable.     Disposition:  Discharge Note:  4:05 PM  The pt is ready for discharge. The pt's signs, symptoms, diagnosis, and discharge instructions have been discussed and pt has conveyed their understanding. The pt is to follow up as recommended or return to ER should their symptoms worsen. Plan has been discussed and pt is in agreement.    PLAN:  1.   Discharge Medication List as of 12/06/2016  3:27 PM      START taking these medications    Details   traMADol (ULTRAM) 50 mg tablet Take 1 Tab by mouth every six (6) hours as needed for Pain. Max Daily Amount: 200 mg., Print, Disp-20 Tab, R-0         CONTINUE these medications which have NOT CHANGED    Details   rOPINIRole (REQUIP) 1 mg tablet Take 4 tabs 1-3 hours before bedtime., NormalNeeds f/u appointment in clinic for any additional refills.Disp-120 Tab, R-2      pravastatin (PRAVACHOL) 40 mg tablet Take 1 Tab by mouth nightly., Normal, Disp-90 Tab, R-0      diazePAM (VALIUM) 5 mg tablet Take 1 Tab by mouth three (3) times daily., Historical Med, R-5      gabapentin enacarbil (HORIZANT) 600 mg TbER Take 1 Tab by mouth every evening., Normal, Disp-30 Tab, R-11      aspirin 81 mg chewable tablet Take 1 Tab by mouth daily., Print, Disp-30 Tab, R-0      ergocalciferol (ERGOCALCIFEROL) 50,000 unit capsule Take 1 Cap by mouth every seven (7) days., Normal, Disp-12 Cap, R-1      escitalopram oxalate (LEXAPRO) 20 mg tablet Take 1 Tab by mouth daily., Normal, Disp-30 Tab, R-5      omeprazole (PRILOSEC) 20 mg capsule Take 20 mg by mouth nightly., Historical Med           2.   Follow-up Information     Follow up With Details Comments K. I. Sawyer, MD Call in 1 day  [place]  [place] [address]   [phone]      Darin Engels, MD Call in 1 day  [address]  [address]  [place] [address]  [phone]      [facility] EMER[facility]  If symptoms worsen [place]  [phone]        Return to ED if worse     Diagnosis     Clinical Impression:   1. Acute chest pain    2. SOB (shortness of breath)        Attestations:    Attestation:  This note is prepared by Danae Chen A. Barrett, acting as Scribe for Seward Speck, MD.      Seward Speck, MD: The scribe's documentation has been prepared under my direction and personally reviewed by me in its entirety. I confirm that the note above accurately reflects  all work, treatment, procedures, and medical decision making performed by me.

## 2016-12-06 NOTE — ED Notes (Signed)
Assumed care of pt. Pt reports hx of PE x6 in the past, with 5 being within the past year. Reports PCP recently dc'ed her Xarelto and expresses concern regarding whether or not it should've been dc'ed with her hx of PEs.

## 2016-12-06 NOTE — ED Notes (Signed)
Dr. Smith gave and reviewed discharge instructions with the patient.  The patient verbalized understanding.  The patient was given opportunity for questions.  Patient discharged in stable condition to the waiting room ambulatory with steady gait.

## 2016-12-06 NOTE — ED Notes (Signed)
Pt reports that since she is driving, she does not want pain medication.

## 2016-12-06 NOTE — ED Notes (Signed)
Dr. Smith at bedside to provide discharge paperwork. Vital signs stable. Pt in no apparent distress at this time. Mental status at baseline. Ambulatory to waiting room with steady gate, discharge paperwork in hand.

## 2016-12-07 ENCOUNTER — Inpatient Hospital Stay
Admit: 2016-12-07 | Discharge: 2016-12-07 | Disposition: A | Discharge status: Home / Self Care | Payer: BLUE CROSS/BLUE SHIELD | Attending: Emergency Medicine

## 2016-12-07 DIAGNOSIS — J9601 Acute respiratory failure with hypoxia: Secondary | ICD-10-CM

## 2016-12-07 LAB — EKG 12-LEAD
Atrial Rate: 65 {beats}/min
Diagnosis: NORMAL
P Axis: 104 degrees
P-R Interval: 138 ms
Q-T Interval: 436 ms
QRS Duration: 76 ms
QTc Calculation (Bazett): 453 ms
R Axis: 7 degrees
T Axis: 36 degrees
Ventricular Rate: 65 {beats}/min

## 2016-12-07 LAB — CBC WITH AUTOMATED DIFF
ABS. BASOPHILS: 0.1 10*3/uL (ref 0.0–0.1)
ABS. EOSINOPHILS: 0.2 10*3/uL (ref 0.0–0.4)
ABS. IMM. GRANS.: 0 10*3/uL (ref 0.00–0.04)
ABS. LYMPHOCYTES: 2.3 10*3/uL (ref 0.8–3.5)
ABS. MONOCYTES: 0.5 10*3/uL (ref 0.0–1.0)
ABS. NEUTROPHILS: 3.5 10*3/uL (ref 1.8–8.0)
ABSOLUTE NRBC: 0 10*3/uL (ref 0.00–0.01)
BASOPHILS: 1 % (ref 0–1)
EOSINOPHILS: 4 % (ref 0–7)
HCT: 32.1 % — ABNORMAL LOW (ref 35.0–47.0)
HGB: 9.9 g/dL — ABNORMAL LOW (ref 11.5–16.0)
IMMATURE GRANULOCYTES: 0 % (ref 0.0–0.5)
LYMPHOCYTES: 35 % (ref 12–49)
MCH: 23.9 PG — ABNORMAL LOW (ref 26.0–34.0)
MCHC: 30.8 g/dL (ref 30.0–36.5)
MCV: 77.3 FL — ABNORMAL LOW (ref 80.0–99.0)
MONOCYTES: 7 % (ref 5–13)
MPV: 9.2 FL (ref 8.9–12.9)
NEUTROPHILS: 53 % (ref 32–75)
NRBC: 0 PER 100 WBC
PLATELET: 256 10*3/uL (ref 150–400)
RBC: 4.15 M/uL (ref 3.80–5.20)
RDW: 14.9 % — ABNORMAL HIGH (ref 11.5–14.5)
WBC: 6.6 10*3/uL (ref 3.6–11.0)

## 2016-12-07 LAB — METABOLIC PANEL, COMPREHENSIVE
A-G Ratio: 0.9 — ABNORMAL LOW (ref 1.1–2.2)
ALT (SGPT): 21 U/L (ref 12–78)
AST (SGOT): 20 U/L (ref 15–37)
Albumin: 3.7 g/dL (ref 3.5–5.0)
Alk. phosphatase: 147 U/L — ABNORMAL HIGH (ref 45–117)
Anion gap: 6 mmol/L (ref 5–15)
BUN/Creatinine ratio: 15 (ref 12–20)
BUN: 13 MG/DL (ref 6–20)
Bilirubin, total: 0.2 MG/DL (ref 0.2–1.0)
CO2: 29 mmol/L (ref 21–32)
Calcium: 9 MG/DL (ref 8.5–10.1)
Chloride: 105 mmol/L (ref 97–108)
Creatinine: 0.88 MG/DL (ref 0.55–1.02)
GFR est AA: >60 mL/min/{1.73_m2} (ref >60)
GFR est non-AA: >60 mL/min/{1.73_m2} (ref >60)
Globulin: 4 g/dL (ref 2.0–4.0)
Glucose: 87 mg/dL (ref 65–100)
Potassium: 4.1 mmol/L (ref 3.5–5.1)
Protein, total: 7.7 g/dL (ref 6.4–8.2)
Sodium: 140 mmol/L (ref 136–145)

## 2016-12-07 LAB — BLOOD GAS, ARTERIAL
BASE EXCESS: 2.2 mmol/L
BICARBONATE: 27 mmol/L — ABNORMAL HIGH (ref 22–26)
O2 FLOW RATE: 2 L/min
O2 SAT: 97 % (ref 92–97)
PCO2: 42 mmHg (ref 35.0–45.0)
PO2: 93 mmHg (ref 80–100)
SPONTANEOUS RATE: 21
pH: 7.43 (ref 7.35–7.45)

## 2016-12-07 LAB — URINALYSIS W/ REFLEX CULTURE
Bacteria: NEGATIVE /hpf
Bilirubin: NEGATIVE
Blood: NEGATIVE
Glucose: NEGATIVE mg/dL
Ketone: NEGATIVE mg/dL
Nitrites: NEGATIVE
Protein: NEGATIVE mg/dL
Specific gravity: 1.015 (ref 1.003–1.030)
Urobilinogen: 0.2 EU/dL (ref 0.2–1.0)
pH (UA): 6.5 (ref 5.0–8.0)

## 2016-12-07 LAB — EKG, 12 LEAD, INITIAL
Atrial Rate: 65 {beats}/min
Calculated P Axis: 104 degrees
Calculated R Axis: 7 degrees
Calculated T Axis: 36 degrees
Diagnosis: NORMAL
P-R Interval: 138 ms
Q-T Interval: 436 ms
QRS Duration: 76 ms
QTC Calculation (Bezet): 453 ms
Ventricular Rate: 65 {beats}/min

## 2016-12-07 LAB — NT-PRO BNP: NT pro-BNP: 79 PG/ML (ref 0–125)

## 2016-12-07 MED ORDER — SODIUM CHLORIDE 0.9 % IJ SYRG
Freq: Three times a day (TID) | INTRAMUSCULAR | Status: DC
Start: 2016-12-07 — End: 2016-12-10
  Administered 2016-12-08 – 2016-12-10 (×9): via INTRAVENOUS

## 2016-12-07 MED ORDER — TRAMADOL 50 MG TAB
50 mg | ORAL | Status: AC
Start: 2016-12-07 — End: 2016-12-07
  Administered 2016-12-07: 22:00:00 via ORAL

## 2016-12-07 MED ORDER — SODIUM CHLORIDE 0.9 % IJ SYRG
INTRAMUSCULAR | Status: DC | PRN
Start: 2016-12-07 — End: 2016-12-10

## 2016-12-07 MED ORDER — DIAZEPAM 5 MG TAB
5 mg | ORAL | Status: AC
Start: 2016-12-07 — End: 2016-12-07
  Administered 2016-12-07: 20:00:00 via ORAL

## 2016-12-07 MED ORDER — SODIUM CHLORIDE 0.9% BOLUS IV
0.9 % | INTRAVENOUS | Status: AC
Start: 2016-12-07 — End: 2016-12-07
  Administered 2016-12-07: 20:00:00 via INTRAVENOUS

## 2016-12-07 MED ORDER — ENOXAPARIN 40 MG/0.4 ML SUB-Q SYRINGE
40 mg/0.4 mL | SUBCUTANEOUS | Status: DC
Start: 2016-12-07 — End: 2016-12-10
  Administered 2016-12-08 – 2016-12-10 (×3): via SUBCUTANEOUS

## 2016-12-07 MED ORDER — IPRATROPIUM-ALBUTEROL 2.5 MG-0.5 MG/3 ML NEB SOLUTION
2.5 mg-0.5 mg/3 ml | Freq: Once | RESPIRATORY_TRACT | Status: AC
Start: 2016-12-07 — End: 2016-12-07
  Administered 2016-12-07: 20:00:00 via RESPIRATORY_TRACT

## 2016-12-07 MED FILL — DIAZEPAM 5 MG TAB: 5 mg | ORAL | Qty: 1

## 2016-12-07 MED FILL — IPRATROPIUM-ALBUTEROL 2.5 MG-0.5 MG/3 ML NEB SOLUTION: 2.5 mg-0.5 mg/3 ml | RESPIRATORY_TRACT | Qty: 3

## 2016-12-07 MED FILL — TRAMADOL 50 MG TAB: 50 mg | ORAL | Qty: 1

## 2016-12-07 NOTE — Consults (Signed)
[facility] Pul[facility] Consult Service Note  Pulmonary, Critical Care, and Sleep Medicine    Name: Lindsay [name] MRN: [id number]   DOB: [birth year] Hospital: [facility]   Date: 12/07/2016   Hospital Day: 1       Subjective/Interval History:   I have reviewed the notes from other providers and old records readily available and summarized findings below with the flowsheet. Seen earlier today on rounds.    Excerpts from admission and consult notes reviewed as follows:     Hospital Problems  Date Reviewed: 2016/10/15          Codes Class Noted POA    Respiratory failure, acute (HCC) ICD-10-CM: J96.00  ICD-9-CM: 518.81  12/07/2016 Unknown              IMPRESSION:   1. Acute respiratory failure with hypoxia  2. Atypical chest pain  3. Anemia- more than I would expect and might be contributing to sx  4. 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 H/o bilateral TKR  6. Body mass index is 31.36 kg/(m^2).  7. Additional workup outlined below  8. Prognosis guarded with significant risk of sudden decline       RECOMMENDATIONS/PLAN:   1. Discussed with Lindsay [name] and Lindsay [name]  2. Supplemental O2 to keep sats > 93%  3. Echo in AM to look for intracardiac shunt, Pulmonary HTN  4. CBC in AM- may need anemia evaluation- per Lindsay [name]  5. Incentive spirometer  6. Further recommendations pending findings  7. Prescription drug management with home med reconciliation reviewed          My assessment/management discussed with: Consultants, Nursing, Pharmacy, Case Management, PT, OT, Respiratory Therapy, Hospitalist and Family for coordination of care    Pt's condition is acute and unstable requiring inpatient hospitalization. This care involved high complexity decision making which includes independently reviewing the patient's past medical records, current laboratory results, medication profiles that  were immediately available to me and actual Xray images at the bedside in order to assess, support vital system function, and to treat this degree of vital organ system failure, and to prevent further deterioration of the patient???s condition.     Risk of deterioration: medium and high   [x]  High complexity decision making was performed  [x]  See my orders for details  Tubes:       Subjective/Initial History:     I was asked by Lindsay Jakes, DO to see Lindsay [name]  a 61 y.o.  Caucasian female in consultation for a chief complaint of acute respiratory failure with hypoxia    Pt is a 61yo WF life long non smoker with h/o recurrent thromboembolic disease who presented to ER for second time on two days with c/o chest and back pain associated with hypoxia, sats 81-82% on room air. Pt awoke yesterday feeling ok but devloped lightheadedness, SOb with heaviness in lower anterior ribs and in her posterior lung fields. Non pleurtic. [facility] and sent to [facility] ER. CXR and CTa chest did not show acute problems, No PE. Pt sent home with pain meds and did ok overnight until this AM. Sats no better. Felt same and came back I was asked to see pt for Lindsay [name] in consultaion. Sats indeed were low but responded to nasal O2. No wheeze. No URI or GI sx. No palpitations of congestion.  Pt reports checking her sat three weeks ago after exertions and sats 97%!! No cardiac problems.    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 Negative workup for thrombophilia and was not on estrogen products. InApril 2017 had left TKR without incident. June 2017 had [name] TKR and later developed RLE DVT and bilateral PE found and treated at [facility]. Pt started on Xarelto for three - four months. Repeat dopplers showed [name] LE DVT resolved but LLE DVT diagnosed. REstarted on Xarelto Sept 2017. Lindsay [name] stopped April 2018. She was without compliants at that time.     PCP: LindsayFrancesca Darl [name]    No Known Allergies     MAR reviewed and pertinent  medications noted or modified as needed     Current Facility-Administered Medications   Medication   ??? sodium chloride (NS) flush 5-10 mL   ??? sodium chloride (NS) flush 5-10 mL   ??? enoxaparin (LOVENOX) injection 40 mg     Current Outpatient Prescriptions   Medication Sig   ??? diazePAM (VALIUM) 5 mg tablet Take 5 mg by mouth daily. 5 mg every morning and 10 mg QHS   ??? traMADol (ULTRAM) 50 mg tablet Take 1 Tab by mouth every six (6) hours as needed for Pain. Max Daily Amount: 200 mg.   ??? rOPINIRole (REQUIP) 1 mg tablet Take 4 tabs 1-3 hours before bedtime.   ??? pravastatin (PRAVACHOL) 40 mg tablet Take 1 Tab by mouth nightly.   ??? diazePAM (VALIUM) 5 mg tablet Take 10 mg by mouth nightly. 5 mg every morning and 10 mg QHS   ??? gabapentin enacarbil (HORIZANT) 600 mg TbER Take 1 Tab by mouth every evening.   ??? aspirin 81 mg chewable tablet Take 1 Tab by mouth daily.   ??? ergocalciferol (ERGOCALCIFEROL) 50,000 unit capsule Take 1 Cap by mouth every seven (7) days.   ??? escitalopram oxalate (LEXAPRO) 20 mg tablet Take 1 Tab by mouth daily.   ??? omeprazole (PRILOSEC) 20 mg capsule Take 20 mg by mouth nightly.      PMH:  has a past medical history of Abnormal brain scan; Arthritis; Chronic pain; Depression; DVT (deep venous thrombosis) (HCC); Falls frequently; Fracture of left ankle; VWUJWJXB(147.8) (2011); Ill-defined condition (09/15/2015); Other and unspecified hyperlipidemia (12/27/2012); PE (pulmonary embolism) (12/2012); RLS (restless legs syndrome) (03/16/2016); TIA (transient ischemic attack) (03/2016); and Torn rotator cuff. She also has no past medical history of Complication of anesthesia.  PSH:   has a past surgical history that includes hx appendectomy; pr breast surgery procedure unlisted (2002); hx gi; hx cesarean section; hx tubal ligation; hx orthopaedic (08/2004); hx orthopaedic; hx orthopaedic (6/13); hx other surgical (2010); and hx knee replacement (Left, 10/29/2015).   FHX: family history includes  Arthritis-rheumatoid in her sister; Cancer in her father; Heart Disease in her father and mother; No Known Problems in her daughter, daughter, daughter, and son. There is no history of Anesth Problems.   SHX:  reports that she has never smoked. She has never used smokeless tobacco. She reports that she drinks about 8.4 oz of alcohol per week  She reports that she does not use illicit drugs.     ROS:A comprehensive review of systems was negative except for that written in the HPI.    Objective:     Vital Signs: Telemetry:    normal sinus rhythm Intake/Output:   Visit Vitals   ??? BP 113/75   ??? Pulse 97   ??? Temp 98.6 ??F (37 ??C)   ???  Resp 19   ??? Ht 5\' 3"  (1.6 m)   ??? Wt 80.3 kg (177 lb 0.5 oz)   ??? SpO2 95%   ??? BMI 31.36 kg/m2       Temp (24hrs), Avg:98.6 ??F (37 ??C), Min:98.6 ??F (37 ??C), Max:98.6 ??F (37 ??C)        O2 Device: Nasal cannula O2 Flow Rate (L/min): 2 l/min       Wt Readings from Last 4 Encounters:   12/07/16 80.3 kg (177 lb 0.5 oz)   12/06/16 80.2 kg (176 lb 12.9 oz)   10/06/16 79.8 kg (176 lb)   09/15/16 76.7 kg (169 lb)        No intake or output data in the 24 hours ending 12/07/16 1828    Last shift:         Last 3 shifts:         Physical Exam:    General:  Caucasian female; alert and oriented times 3; NC;   HEAD: Normocephalic, without obvious abnormality, atraumatic   EYES: conjunctivae clear. PERRL,  AN Icteric sclerae   NOSE: nares normal, no drainage, no nasal flaring,    THROAT: normal; Lips, mucosa dry; No Thrush; class 3-4 airway; tongue midline   Neck: Supple, symmetrical, trachea midline,  No accessory mm use; No Stridor/ cuff leak, No goiter or thyroid tenderness   LYMPH: No abnormally enlarged lymph nodes. in neck or groin   Chest: normal   Lungs: decreased air exchange bibasilar   Heart: Regular rate and rhythm; NO edema   Abdomen: soft, non-tender, without masses or organomegaly, protuberant   GU: no Foley    Musculoskeletal: NO kyphosis; No spine or CVA tenderness; negative, cyanosis,  clubbing; no joint swelling or erythema   Neuro: alert; speech fluent ;withdraws to pain; does follow simple commands   Psych: oriented to time, place and person;  No agitation;  normal affect;    Skin: Skin unremarkable;    Pulses:Bilateral, Radial, 2+   Capillary refill: normal; well perfused,    Data:     Lab results reviewed. For significant abnormal values and values requiring intervention, see assessment and plan.             Labs:    Recent Labs      12/07/16   1538  12/06/16   1122   WBC  6.6  7.7   HGB  9.9*  10.4*   PLT  256  259     Recent Labs      12/07/16   1538  12/06/16   1122   NA  140  139   K  4.1  4.2   CL  105  105   CO2  29  27   GLU  87  97   BUN  13  16   CREA  0.88  0.86   CA  9.0  9.3   ALB  3.7  3.9   SGOT  20  17   ALT  21  22     Recent Labs      12/07/16   1528   PH  7.43   PCO2  42   PO2  93   HCO3  27*     Recent Labs      12/06/16   1122   CPK  219*   CKNDX  1.3   TROIQ  <0.04     No results found for: BNPP, BNP   Lab Results  Component Value Date/Time    Culture result: MRSA NOT PRESENT 12/10/2015 10:30 AM    Culture result:  12/10/2015 10:30 AM         Screening of patient nares for MRSA is for surveillance purposes and, if positive, to facilitate isolation considerations in high risk settings. It is not intended for automatic decolonization interventions per se as regimens are not sufficiently effective to warrant routine use.    Culture result: MRSA NOT PRESENT 10/27/2015 09:05 AM    Culture result:  10/27/2015 09:05 AM         Screening of patient nares for MRSA is for surveillance purposes and, if positive, to facilitate isolation considerations in high risk settings. It is not intended for automatic decolonization interventions per se as regimens are not sufficiently effective to warrant routine use.     Lab Results   Component Value Date/Time    TSH 2.080 09/15/2016 01:42 PM       Imaging:          Results from Hospital Encounter encounter on 12/06/16   XR CHEST PA LAT    Narrative EXAM:  XR CHEST PA LAT.  INDICATION: Chest pain.  COMPARISON: 05/04/2010.    FINDINGS:   PA and lateral radiographs of the chest were obtained.  Lungs: The lungs are clear of mass, nodule, airspace disease or edema.  Pleura: There is no pleural effusion or pneumothorax.  Mediastinum: The cardiac and mediastinal contours and pulmonary vascularity are  normal.  Bones and soft tissues: The bones and soft tissues are within normal limits.         Impression IMPRESSION: No acute abnormality and no change.            Results from Hospital Encounter encounter on 12/06/16   CTA CHEST W OR W WO CONT   Narrative EXAM:  CTA CHEST W OR W WO CONT    INDICATION:   Acute chest pain, upper back pain, and shortness of breath today    COMPARISON: 03/26/2016.    TECHNIQUE:   Precontrast scout images were obtained to localize the volume for acquisition.  Multislice helical CT arteriography was performed from the diaphragm to the  thoracic inlet during uneventful rapid bolus of 80 cc Isovue-370. Lung and soft  tissue windows were generated.  Coronal and sagittal images were generated and  3D post processing consisting of coronal maximum intensity images was performed.   CT dose reduction was achieved through use of a standardized protocol tailored  for this examination and automatic exposure control for dose modulation.      FINDINGS:  CHEST:  THYROID: No nodule.  MEDIASTINUM: No mass or lymphadenopathy.  HILA: No mass or lymphadenopathy.  THORACIC AORTA: No dissection or aneurysm.  MAIN PULMONARY ARTERY: No evidence of pulmonary embolism.  TRACHEA/BRONCHI: Patent.  ESOPHAGUS: No wall thickening or dilatation.  HEART: Normal in size.  PLEURA: No effusion or pneumothorax.  LUNGS: No nodule, mass, or airspace disease.  INCIDENTALLY IMAGED UPPER ABDOMEN: No focal abnormality.  BONES: Degenerative changes are seen in the thoracic spine.           Impression IMPRESSION: No evidence of pulmonary embolism or acute abnormality.           I have personally and independently reviewed the patient???s interval and diagnostic data, radiographs and have reviewed the reports.    I have ordered additional labs to follow the current medical conditions and to monitor treatment responses over the next 24 hours or  sooner if needed. If the pt needs,  additional imaging will be obtained to follow longitudinal changes found on the most current imaging.      Medical Decision Making Today:  ?? Reviewed the flowsheet and previous days notes  ?? Reviewed and summarized records or history from previous days note or discussions with staff, family  ?? Parenteral controlled substances - Reviewed/ Adjusted / Weaned / Started  ?? High Risk Drug therapy requiring intensive monitoring for toxicity: eg steroids, pressors, antibiotics  ?? Reviewed and/or ordered Clinical lab tests  ?? Reviewed and/or ordered Radiology tests  ?? Reviewed and/or ordered of Medicine tests  ?? Independently visualized radiologic Images  ?? I have personally reviewed the patients ECG / Telemetry    Thayer Headings, MD

## 2016-12-07 NOTE — ED Notes (Signed)
Patient presents to the ED with complaints of chest pain and SOB that began yesterday morning. Patient states she [facility] and was informed of an elevated D Dimer. She was then referred to the ED (yesterday) and had a CT completed which was negative for blood clots. Patient states she went home and throughout the night she monitored her oxygen level which dropped to 81-83%.     Patient states she is also having some chest heaviness and sharp pains in the back of her lungs. She states she also feels like she is more forgetful than normal.     Patient on the monitor x3, call bell within reach. Side rails x2. Family at bedside.

## 2016-12-07 NOTE — ED Notes (Signed)
Pt c/o headache. Dr. Hetty ElyHeimbach notified.

## 2016-12-07 NOTE — Progress Notes (Signed)
Pharmacy Clarification of Prior to Admission Medication Regimen     The patient was interviewed regarding clarification of the prior to admission medication regimen. Patient's daughter was present in room and obtained permission from patient to discuss drug regimen with visitor(s) present.     Patient was questioned regarding use of any other inhalers, topical products, over the counter medications, herbal medications, vitamin products or ophthalmic/nasal/otic medication use.     Information Obtained From: Rx Query and patient    Pertinent Pharmacy Findings: None      PTA medication list was corrected to the following:     Prior to Admission Medications   Prescriptions Last Dose Informant Patient Reported? Taking?   aspirin 81 mg chewable tablet 12/05/2016 at Unknown time Self No Yes   Sig: Take 1 Tab by mouth daily.   diazePAM (VALIUM) 5 mg tablet 12/06/2016 at Unknown time Self Yes Yes   Sig: Take 10 mg by mouth nightly. 5 mg every morning and 10 mg QHS   diazePAM (VALIUM) 5 mg tablet 12/05/2016 at Unknown time Self Yes Yes   Sig: Take 5 mg by mouth daily. 5 mg every morning and 10 mg QHS   ergocalciferol (ERGOCALCIFEROL) 50,000 unit capsule 12/05/2016 at Unknown time Self No Yes   Sig: Take 1 Cap by mouth every seven (7) days.   escitalopram oxalate (LEXAPRO) 20 mg tablet 12/06/2016 at Unknown time Self No Yes   Sig: Take 1 Tab by mouth daily.   gabapentin enacarbil (HORIZANT) 600 mg TbER 12/06/2016 at Unknown time Self No Yes   Sig: Take 1 Tab by mouth every evening.   omeprazole (PRILOSEC) 20 mg capsule 12/06/2016 at Unknown time Self Yes Yes   Sig: Take 20 mg by mouth nightly.   pravastatin (PRAVACHOL) 40 mg tablet 12/06/2016 at Unknown time Self No Yes   Sig: Take 1 Tab by mouth nightly.   rOPINIRole (REQUIP) 1 mg tablet 12/06/2016 at Unknown time Self No Yes   Sig: Take 4 tabs 1-3 hours before bedtime.   traMADol (ULTRAM) 50 mg tablet 12/07/2016 at Unknown time Self No Yes    Sig: Take 1 Tab by mouth every six (6) hours as needed for Pain. Max Daily Amount: 200 mg.      Facility-Administered Medications: None          Thank you,  Pietro CassisBrittany Pitts, CPhT  Medication History Pharmacologistharmacy Technician

## 2016-12-07 NOTE — ED Notes (Signed)
Patient placed on 2L of oxygen via NC due to decrease in oxygen saturations (88-89%). Patient denies feeling more SOB

## 2016-12-07 NOTE — H&P (Signed)
Hospitalist Admission Note    NAME: Lindsay [name]   DOB:  24-Feb-1956   MRN:  [id number]     Date/Time:  12/07/2016 6:21 PM    Patient PCP: Flora Lipps, MD  ______________________________________________________________________  Given the patient's current clinical presentation, I have a high level of concern for decompensation if discharged from the emergency department.  Complex decision making was performed, which includes reviewing the patient's available past medical records, laboratory results, and x-ray films.       My assessment of this patient's clinical condition and my plan of care is as follows.    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  -On Xarelto until April and taken off by PCP  -CTA chest negative for PE in ED yesteraday (6/4) but ongoing shortness of breath with hypoxia (per patient report on home pulse ox)  -Case discussed with Dr Jacelyn Grip, input appreciated, may require lifelong anticoag  -will check echo with bubble study, exclude intracardiac thrombus  -admited as obs, consider change to inpatient if further workup needed following echo, can discuss with pulm team    Acute microcytic anemia  -monitor CBC  -check iron studies  -pt denies any stool changes  -Was on xarelto until recently but no resolution of anemia,  which is of unclear etiology, since DC of [facility]  -further work-up pending clinical course    Obesity  -lifestyle mods, weight loss    Body mass index is 31.36 kg/(m^2).    Code Status: Full  Surrogate Decision Maker: Husband    DVT Prophylaxis: Lovenox  GI Prophylaxis: not indicated    Baseline: Pt resides at home with husband, independent with ADLs, very active      Subjective:   CHIEF COMPLAINT: Shortness of breath, hypoxia    HISTORY OF PRESENT ILLNESS:      Lindsay [name] is a 61 y.o.  Caucasian, female, seen in the ED here yesterday for shortness of breath. She also admits some chest discomfort and also lumbar back pain when attempting deep breaths. She was seen in an acute care facility and then here at [facility] in the ED yesterday with unremarkable CXR and CTA chest which was negative for PE. Pt was discharged home but reports no improvement of symptoms last night or this AM and notes home pulse ox readings in the low 80s this AM. She has no history of chronic resp failure and is not on home O2, has never been a smoker. Of note, She does have a significant history of multiple PEs and DVTs, dating back to 2014, at which she underwent B/L total knee replacements in short sequence (about 7 weeks apart). She reports that she initially underwent the Lt knee surgery without issues but developed RLE DVT and then B/L PE. Pt was started on Xarelto. Later she had repeat LE dopplers which showed resolution of the RLE DVT but new LLE DVT was diagnosed. Around this time the patient underwent a thrombophilia work-up at [facility] which was unremarkable by her report.     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 At that time patient underwent B/L total knee replacements, first on the Lt knee and subsequently on the left, in a short sequence over about 7 weeks.  Xarelto was recently discontinued by her PCP (10/2016).    We were asked to admit for work up and evaluation of the above problems.     Past Medical History:   Diagnosis Date   ??? Abnormal brain scan    ??? Arthritis    ??? Chronic pain    ??? Depression    ??? DVT (deep venous thrombosis) ([place])    ??? Falls frequently    ??? Fracture of left ankle    ??? Headache(784.0) 2011   ??? Ill-defined condition 09/15/2015    FX R HAND   ??? Other and unspecified hyperlipidemia 12/27/2012   ??? PE (pulmonary embolism) 12/2012   ??? RLS (restless legs syndrome) 03/16/2016    Dr Roseanne Reno    ??? TIA (transient ischemic attack) 03/2016    Right facial droop, numbness in lip   ??? Torn rotator cuff     right        Past Surgical History:   Procedure Laterality Date   ??? BREAST SURGERY PROCEDURE UNLISTED  2002    AUGMENTATION   ??? HX APPENDECTOMY     ??? HX CESAREAN SECTION      x4   ??? HX GI      COLONOSCOPY   ??? 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 HX ORTHOPAEDIC  08/2004    rotater cuff r. shoulder   ??? HX ORTHOPAEDIC      left ankle fracture treated with air cast   ??? HX ORTHOPAEDIC  6/13    left shoulder fxt and dislocation   ??? HX OTHER SURGICAL  2010    left foot torn tendon   ??? HX TUBAL LIGATION         Social History   Substance Use Topics   ??? Smoking status: Never Smoker   ??? Smokeless tobacco: Never Used   ??? Alcohol use 8.4 oz/week     14 Cans of beer per week      Comment: 2 MICHELOB ULTRA PER DAY        Family History   Problem Relation Age of Onset   ??? Heart Disease Mother    ??? Heart Disease Father    ??? Cancer Father      thyroid cancer   ??? Arthritis-rheumatoid Sister    ??? No Known Problems Son    ??? No Known Problems Daughter    ??? No Known Problems Daughter    ??? No Known Problems Daughter    ??? Anesth Problems Neg Hx      No Known Allergies     Prior to Admission medications    Medication Sig Start Date End Date Taking? Authorizing Provider   diazePAM (VALIUM) 5 mg tablet Take 5 mg by mouth daily. 5 mg every morning and 10 mg QHS   Yes Historical Provider   traMADol (ULTRAM) 50 mg tablet Take 1 Tab by mouth every six (6) hours as needed for Pain. Max Daily Amount: 200 mg. 12/06/16  Yes Seward Speck, MD   rOPINIRole (REQUIP) 1 mg tablet Take 4 tabs 1-3 hours before bedtime. 10/27/16  Yes Chauncey Reading, MD   pravastatin (PRAVACHOL) 40 mg tablet Take 1 Tab by mouth nightly. 09/28/16  Yes Flora Lipps, MD   diazePAM (VALIUM) 5 mg tablet Take 10 mg by mouth nightly. 5 mg every morning and 10 mg QHS 08/28/16  Yes Historical Provider   gabapentin enacarbil (HORIZANT) 600 mg TbER Take 1 Tab by mouth every  evening. 04/23/16  Yes Chauncey Reading, MD   aspirin 81 mg chewable tablet Take 1 Tab by mouth daily. 03/27/16  Yes Jettie Booze, MD   ergocalciferol (ERGOCALCIFEROL) 50,000 unit capsule Take 1 Cap by mouth every seven (7) days. 11/08/14  Yes Flora Lipps, MD   escitalopram oxalate (LEXAPRO) 20 mg tablet Take 1 Tab by mouth daily. 01/07/14  Yes Flora Lipps, MD   omeprazole (PRILOSEC) 20 mg capsule Take 20 mg by mouth nightly.   Yes Historical Provider       REVIEW OF SYSTEMS:     I am not able to complete the review of systems because:   The patient is intubated and sedated    The patient has altered mental status due to his acute medical problems    The patient has baseline aphasia from prior stroke(s)    The patient has baseline dementia and is not reliable historian    The patient is in acute medical distress and unable to provide information           Total of 12 systems reviewed as follows:       POSITIVE= underlined text  Negative = text not underlined  General:  fever, chills, sweats, generalized weakness, weight loss/gain,      loss of appetite   Eyes:    blurred vision, eye pain, loss of vision, double vision  ENT:    rhinorrhea, pharyngitis   Respiratory:   cough, sputum production, SOB, DOE, wheezing, pleuritic pain   Cardiology:   chest pain, palpitations, orthopnea, PND, edema, syncope   Gastrointestinal:  abdominal pain , N/V, diarrhea, dysphagia, constipation, bleeding   Genitourinary:  frequency, urgency, dysuria, hematuria, incontinence   Muskuloskeletal :  arthralgia, myalgia, back pain  Hematology:  easy bruising, nose or gum bleeding, lymphadenopathy   Dermatological: rash, ulceration, pruritis, color change / jaundice  Endocrine:   hot flashes or polydipsia   Neurological:  headache, dizziness, confusion, focal weakness, paresthesia,     Speech difficulties, memory loss, gait difficulty  Psychological: Feelings of anxiety, depression, agitation    Objective:   VITALS:    Visit Vitals    ??? BP 113/75   ??? Pulse 97   ??? Temp 98.6 ??F (37 ??C)   ??? Resp 19   ??? Ht '5\' 3"'$  (1.6 m)   ??? Wt 80.3 kg (177 lb 0.5 oz)   ??? SpO2 95%   ??? BMI 31.36 kg/m2       PHYSICAL EXAM:    General:    Alert, cooperative, no distress, appears stated age.     HEENT: Atraumatic, anicteric sclerae, pink conjunctivae     No oral ulcers, mucosa moist, throat clear, dentition fair  Neck:  Supple, symmetrical,  thyroid: non tender  Lungs:   Clear to auscultation bilaterally.  No Wheezing or Rhonchi. No rales.  Chest wall:  No tenderness  No Accessory muscle use.  Heart:   Regular  rhythm,  No  murmur   No edema  Abdomen:   Soft, non-tender. Not distended.  Bowel sounds normal  Extremities: No cyanosis.  No clubbing,      Skin turgor normal, Capillary refill normal, Radial dial pulse 2+  Skin:     Not pale.  Not Jaundiced  No rashes   Psych:  Good insight.  Not depressed.  Not anxious or agitated.  Neurologic: EOMs intact. No facial asymmetry. No aphasia or slurred speech. Symmetrical strength, Sensation grossly intact. Alert and oriented  X 4.     _______________________________________________________________________  Care Plan discussed with:    Comments   Patient x    Family      RN x    Care Manager                    Consultant:  x    _______________________________________________________________________  Expected  Disposition:   Home with Family    HH/PT/OT/RN    SNF/LTC    [facility]    ________________________________________________________________________  TOTAL TIME: 43 Minutes    Critical Care Provided     Minutes non procedure based      Comments     Reviewed previous records   >50% of visit spent in counseling and coordination of care  Discussion with patient and/or family and questions answered       ________________________________________________________________________  Signed: Trula Slade, DO    Procedures: see electronic medical records for all procedures/Xrays and  details which were not copied into this note but were reviewed prior to creation of Plan.    LAB DATA REVIEWED:    Recent Results (from the past 24 hour(s))   EKG, 12 LEAD, INITIAL    Collection Time: 12/07/16  1:57 PM   Result Value Ref Range    Ventricular Rate 65 BPM    Atrial Rate 65 BPM    P-R Interval 138 ms    QRS Duration 76 ms    Q-T Interval 436 ms    QTC Calculation (Bezet) 453 ms    Calculated P Axis 104 degrees    Calculated R Axis 7 degrees    Calculated T Axis 36 degrees    Diagnosis       Normal sinus rhythm  Low voltage QRS  Borderline ECG  When compared with ECG of 06-Dec-2016 10:50,  No significant change was found  Confirmed by Adam Phenix [phone]) on 12/07/2016 5:40:25 PM     BLOOD GAS, ARTERIAL    Collection Time: 12/07/16  3:28 PM   Result Value Ref Range    pH 7.43 7.35 - 7.45      PCO2 42 35.0 - 45.0 mmHg    PO2 93 80 - 100 mmHg    O2 SAT 97 92 - 97 %    BICARBONATE 27 (H) 22 - 26 mmol/L    BASE EXCESS 2.2 mmol/L    O2 METHOD NASAL O2      O2 FLOW RATE 2.00 L/min    SPONTANEOUS RATE 21.0      Sample source ARTERIAL      SITE LEFT RADIAL      ALLEN'S TEST YES     CBC WITH AUTOMATED DIFF    Collection Time: 12/07/16  3:38 PM   Result Value Ref Range    WBC 6.6 3.6 - 11.0 K/uL    RBC 4.15 3.80 - 5.20 M/uL    HGB 9.9 (L) 11.5 - 16.0 g/dL    HCT 32.1 (L) 35.0 - 47.0 %    MCV 77.3 (L) 80.0 - 99.0 FL    MCH 23.9 (L) 26.0 - 34.0 PG    MCHC 30.8 30.0 - 36.5 g/dL    RDW 14.9 (H) 11.5 - 14.5 %    PLATELET 256 150 - 400 K/uL    MPV 9.2 8.9 - 12.9 FL    NRBC 0.0 0 PER 100 WBC    ABSOLUTE NRBC 0.00 0.00 - 0.01 K/uL    NEUTROPHILS 53 32 - 75 %  LYMPHOCYTES 35 12 - 49 %    MONOCYTES 7 5 - 13 %    EOSINOPHILS 4 0 - 7 %    BASOPHILS 1 0 - 1 %    IMMATURE GRANULOCYTES 0 0.0 - 0.5 %    ABS. NEUTROPHILS 3.5 1.8 - 8.0 K/UL    ABS. LYMPHOCYTES 2.3 0.8 - 3.5 K/UL    ABS. MONOCYTES 0.5 0.0 - 1.0 K/UL    ABS. EOSINOPHILS 0.2 0.0 - 0.4 K/UL    ABS. BASOPHILS 0.1 0.0 - 0.1 K/UL     ABS. IMM. GRANS. 0.0 0.00 - 0.04 K/UL    DF AUTOMATED     METABOLIC PANEL, COMPREHENSIVE    Collection Time: 12/07/16  3:38 PM   Result Value Ref Range    Sodium 140 136 - 145 mmol/L    Potassium 4.1 3.5 - 5.1 mmol/L    Chloride 105 97 - 108 mmol/L    CO2 29 21 - 32 mmol/L    Anion gap 6 5 - 15 mmol/L    Glucose 87 65 - 100 mg/dL    BUN 13 6 - 20 MG/DL    Creatinine 0.88 0.55 - 1.02 MG/DL    BUN/Creatinine ratio 15 12 - 20      GFR est AA >60 >60 ml/min/1.49m    GFR est non-AA >60 >60 ml/min/1.775m   Calcium 9.0 8.5 - 10.1 MG/DL    Bilirubin, total 0.2 0.2 - 1.0 MG/DL    ALT (SGPT) 21 12 - 78 U/L    AST (SGOT) 20 15 - 37 U/L    Alk. phosphatase 147 (H) 45 - 117 U/L    Protein, total 7.7 6.4 - 8.2 g/dL    Albumin 3.7 3.5 - 5.0 g/dL    Globulin 4.0 2.0 - 4.0 g/dL    A-G Ratio 0.9 (L) 1.1 - 2.2     NT-PRO BNP    Collection Time: 12/07/16  3:38 PM   Result Value Ref Range    NT pro-BNP 79 0 - 125 PG/ML

## 2016-12-07 NOTE — Progress Notes (Addendum)
Primary Nurse Garnette CzechAshley M Wong, RN and Efraim KaufmannMelissa, RN performed a dual skin assessment on this patient No impairment noted  Braden score is 20      10:52 PM-Pt requesting tramadol for chest and back pain.  MD notified; orders received.

## 2016-12-07 NOTE — Progress Notes (Addendum)
[facility] on [facility]06[facility]. Diagnosis : CP & SOB. Attempted to f/u with pt, noted back in ER. Will f/u after d/c.

## 2016-12-07 NOTE — Consults (Signed)
[facility] Pulmonary Consult Service Note  Pulmonary, Critical Care, and Sleep Medicine    Name: Lindsay BillingsKathy A [name] MRN: [id number]   DOB: 04/11/1956 [facility]   Date: 12/07/2016   Hospital Day: 1       Subjective/Interval History:   I have reviewed the notes from other providers and old records readily available and summarized findings below with the flowsheet. Seen earlier today on rounds.    Excerpts from admission and consult notes reviewed as follows:     Hospital Problems  Date Reviewed: 10/06/2016          Codes Class Noted POA    Respiratory failure, acute (HCC) ICD-10-CM: J96.00  ICD-9-CM: 518.81  12/07/2016 Unknown              IMPRESSION:   1. Acute respiratory failure with hypoxia  2. Atypical chest pain  3. Anemia- more than I would expect and might be contributing to sx  4. 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 H/o bilateral TKR  6. Body mass index is 31.36 kg/(m^2).  7. Additional workup outlined below  8. Prognosis guarded with significant risk of sudden decline       RECOMMENDATIONS/PLAN:   1. Discussed with Dr. Hetty ElyHeimbach and Broaddus  2. Supplemental O2 to keep sats > 93%  3. Echo in AM to look for intracardiac shunt, Pulmonary HTN  4. CBC in AM- may need anemia evaluation- per Dr. Arvid RightBroaddus  5. Incentive spirometer  6. Further recommendations pending findings  7. Prescription drug management with home med reconciliation reviewed          My assessment/management discussed with: Consultants, Nursing, Pharmacy, Case Management, PT, OT, Respiratory Therapy, Hospitalist and Family for coordination of care    Pt's condition is acute and unstable requiring inpatient hospitalization. This care involved high complexity decision making which includes independently reviewing the patient's past medical records, current  laboratory results, medication profiles that were immediately available to me and actual Xray images at the bedside in order to assess, support vital system function, and to treat this degree of vital organ system failure, and to prevent further deterioration of the patient???s condition.     Risk of deterioration: medium and high   [x]  High complexity decision making was performed  [x]  See my orders for details  Tubes:       Subjective/Initial History:     I was asked by Shelda Jakeshristopher M Broaddus, DO to see Lindsay BillingsKathy A [name]  a 61 y.o.  Caucasian female in consultation for a chief complaint of acute respiratory failure with hypoxia    Pt is a 61yo WF life long non smoker with h/o recurrent thromboembolic disease who presented to ER for second time on two days with c/o chest and back pain associated with hypoxia, sats 81-82% on room air. Pt awoke yesterday feeling ok but devloped lightheadedness, SOb with heaviness in lower anterior ribs and in her posterior lung fields. Non pleurtic. Went to Pt first and [facility]. CXR and CTa chest did not show acute problems, No PE. Pt sent home with pain meds and did ok overnight until this AM. Sats no better. Felt same and came back I was asked to see pt for Dr. Hetty ElyHeimbach in consultaion. Sats indeed were low but responded to nasal O2. No wheeze. No URI or GI sx. No palpitations of congestion.  Pt reports checking her sat three weeks ago after exertions and sats 97%!! No cardiac problems.    In 2014 pt had unprovoked right lung PE. Negative workup for thrombophilia and was not on estrogen products. InApril 2017 had left TKR without incident. June 2017 had right TKR and later developed RLE DVT and bilateral PE found and treated at [facility]. Pt started on Xarelto for three - four months. Repeat dopplers showed right LE DVT resolved but LLE DVT diagnosed. REstarted on Xarelto Sept 2017. [name] stopped April 2018. She was without compliants at that time.     PCP: Dr.Francesca Darl Pikes     No Known Allergies     MAR reviewed and pertinent medications noted or modified as needed     Current Facility-Administered Medications   Medication   ??? sodium chloride (NS) flush 5-10 mL   ??? sodium chloride (NS) flush 5-10 mL   ??? enoxaparin (LOVENOX) injection 40 mg     Current Outpatient Prescriptions   Medication Sig   ??? diazePAM (VALIUM) 5 mg tablet Take 5 mg by mouth daily. 5 mg every morning and 10 mg QHS   ??? traMADol (ULTRAM) 50 mg tablet Take 1 Tab by mouth every six (6) hours as needed for Pain. Max Daily Amount: 200 mg.   ??? rOPINIRole (REQUIP) 1 mg tablet Take 4 tabs 1-3 hours before bedtime.   ??? pravastatin (PRAVACHOL) 40 mg tablet Take 1 Tab by mouth nightly.   ??? diazePAM (VALIUM) 5 mg tablet Take 10 mg by mouth nightly. 5 mg every morning and 10 mg QHS   ??? gabapentin enacarbil (HORIZANT) 600 mg TbER Take 1 Tab by mouth every evening.   ??? aspirin 81 mg chewable tablet Take 1 Tab by mouth daily.   ??? ergocalciferol (ERGOCALCIFEROL) 50,000 unit capsule Take 1 Cap by mouth every seven (7) days.   ??? escitalopram oxalate (LEXAPRO) 20 mg tablet Take 1 Tab by mouth daily.   ??? omeprazole (PRILOSEC) 20 mg capsule Take 20 mg by mouth nightly.      PMH:  has a past medical history of Abnormal brain scan; Arthritis; Chronic pain; Depression; DVT (deep venous thrombosis) (HCC); Falls frequently; Fracture of left ankle; ZOXWRUEA(540.9) (2011); Ill-defined condition (09/15/2015); Other and unspecified hyperlipidemia (12/27/2012); PE (pulmonary embolism) (12/2012); RLS (restless legs syndrome) (03/16/2016); TIA (transient ischemic attack) (03/2016); and Torn rotator cuff. She also has no past medical history of Complication of anesthesia.  PSH:   has a past surgical history that includes hx appendectomy; pr breast surgery procedure unlisted (2002); hx gi; hx cesarean section; hx tubal ligation; hx orthopaedic (08/2004); hx orthopaedic; hx orthopaedic (6/13); hx other surgical (2010); and hx knee replacement (Left,  10/29/2015).   FHX: family history includes Arthritis-rheumatoid in her sister; Cancer in her father; Heart Disease in her father and mother; No Known Problems in her daughter, daughter, daughter, and son. There is no history of Anesth Problems.   SHX:  reports that she has never smoked. She has never used smokeless tobacco. She reports that she drinks about 8.4 oz of alcohol per week  She reports that she does not use illicit drugs.     ROS:A comprehensive review of systems was negative except for that written in the HPI.    Objective:     Vital Signs: Telemetry:    normal sinus rhythm Intake/Output:   Visit Vitals   ??? BP 113/75   ??? Pulse 97   ??? Temp 98.6 ??F (37 ??C)   ???  Resp 19   ??? Ht 5\' 3"  (1.6 m)   ??? Wt 80.3 kg (177 lb 0.5 oz)   ??? SpO2 95%   ??? BMI 31.36 kg/m2       Temp (24hrs), Avg:98.6 ??F (37 ??C), Min:98.6 ??F (37 ??C), Max:98.6 ??F (37 ??C)        O2 Device: Nasal cannula O2 Flow Rate (L/min): 2 l/min       Wt Readings from Last 4 Encounters:   12/07/16 80.3 kg (177 lb 0.5 oz)   12/06/16 80.2 kg (176 lb 12.9 oz)   10/06/16 79.8 kg (176 lb)   09/15/16 76.7 kg (169 lb)        No intake or output data in the 24 hours ending 12/07/16 1828    Last shift:         Last 3 shifts:         Physical Exam:    General:  Caucasian female; alert and oriented times 3; NC;   HEAD: Normocephalic, without obvious abnormality, atraumatic   EYES: conjunctivae clear. PERRL,  AN Icteric sclerae   NOSE: nares normal, no drainage, no nasal flaring,    THROAT: normal; Lips, mucosa dry; No Thrush; class 3-4 airway; tongue midline   Neck: Supple, symmetrical, trachea midline,  No accessory mm use; No Stridor/ cuff leak, No goiter or thyroid tenderness   LYMPH: No abnormally enlarged lymph nodes. in neck or groin   Chest: normal   Lungs: decreased air exchange bibasilar   Heart: Regular rate and rhythm; NO edema   Abdomen: soft, non-tender, without masses or organomegaly, protuberant   GU: no Foley     Musculoskeletal: NO kyphosis; No spine or CVA tenderness; negative, cyanosis, clubbing; no joint swelling or erythema   Neuro: alert; speech fluent ;withdraws to pain; does follow simple commands   Psych: oriented to time, place and person;  No agitation;  normal affect;    Skin: Skin unremarkable;    Pulses:Bilateral, Radial, 2+   Capillary refill: normal; well perfused,    Data:     Lab results reviewed. For significant abnormal values and values requiring intervention, see assessment and plan.             Labs:    Recent Labs      12/07/16   1538  12/06/16   1122   WBC  6.6  7.7   HGB  9.9*  10.4*   PLT  256  259     Recent Labs      12/07/16   1538  12/06/16   1122   NA  140  139   K  4.1  4.2   CL  105  105   CO2  29  27   GLU  87  97   BUN  13  16   CREA  0.88  0.86   CA  9.0  9.3   ALB  3.7  3.9   SGOT  20  17   ALT  21  22     Recent Labs      12/07/16   1528   PH  7.43   PCO2  42   PO2  93   HCO3  27*     Recent Labs      12/06/16   1122   CPK  219*   CKNDX  1.3   TROIQ  <0.04     No results found for: BNPP, BNP   Lab Results  Component Value Date/Time    Culture result: MRSA NOT PRESENT 12/10/2015 10:30 AM    Culture result:  12/10/2015 10:30 AM         Screening of patient nares for MRSA is for surveillance purposes and, if positive, to facilitate isolation considerations in high risk settings. It is not intended for automatic decolonization interventions per se as regimens are not sufficiently effective to warrant routine use.    Culture result: MRSA NOT PRESENT 10/27/2015 09:05 AM    Culture result:  10/27/2015 09:05 AM         Screening of patient nares for MRSA is for surveillance purposes and, if positive, to facilitate isolation considerations in high risk settings. It is not intended for automatic decolonization interventions per se as regimens are not sufficiently effective to warrant routine use.     Lab Results   Component Value Date/Time    TSH 2.080 09/15/2016 01:42 PM       Imaging:           Results from Hospital Encounter encounter on 12/06/16   XR CHEST PA LAT   Narrative EXAM:  XR CHEST PA LAT.  INDICATION: Chest pain.  COMPARISON: 05/04/2010.    FINDINGS:   PA and lateral radiographs of the chest were obtained.  Lungs: The lungs are clear of mass, nodule, airspace disease or edema.  Pleura: There is no pleural effusion or pneumothorax.  Mediastinum: The cardiac and mediastinal contours and pulmonary vascularity are  normal.  Bones and soft tissues: The bones and soft tissues are within normal limits.         Impression IMPRESSION: No acute abnormality and no change.            Results from Hospital Encounter encounter on 12/06/16   CTA CHEST W OR W WO CONT   Narrative EXAM:  CTA CHEST W OR W WO CONT    INDICATION:   Acute chest pain, upper back pain, and shortness of breath today    COMPARISON: 03/26/2016.    TECHNIQUE:   Precontrast scout images were obtained to localize the volume for acquisition.  Multislice helical CT arteriography was performed from the diaphragm to the  thoracic inlet during uneventful rapid bolus of 80 cc Isovue-370. Lung and soft  tissue windows were generated.  Coronal and sagittal images were generated and  3D post processing consisting of coronal maximum intensity images was performed.   CT dose reduction was achieved through use of a standardized protocol tailored  for this examination and automatic exposure control for dose modulation.      FINDINGS:  CHEST:  THYROID: No nodule.  MEDIASTINUM: No mass or lymphadenopathy.  HILA: No mass or lymphadenopathy.  THORACIC AORTA: No dissection or aneurysm.  MAIN PULMONARY ARTERY: No evidence of pulmonary embolism.  TRACHEA/BRONCHI: Patent.  ESOPHAGUS: No wall thickening or dilatation.  HEART: Normal in size.  PLEURA: No effusion or pneumothorax.  LUNGS: No nodule, mass, or airspace disease.  INCIDENTALLY IMAGED UPPER ABDOMEN: No focal abnormality.  BONES: Degenerative changes are seen in the thoracic spine.            Impression IMPRESSION: No evidence of pulmonary embolism or acute abnormality.          I have personally and independently reviewed the patient???s interval and diagnostic data, radiographs and have reviewed the reports.    I have ordered additional labs to follow the current medical conditions and to monitor treatment responses over the next 24 hours or  sooner if needed. If the pt needs,  additional imaging will be obtained to follow longitudinal changes found on the most current imaging.      Medical Decision Making Today:  ?? Reviewed the flowsheet and previous days notes  ?? Reviewed and summarized records or history from previous days note or discussions with staff, family  ?? Parenteral controlled substances - Reviewed/ Adjusted / Weaned / Started  ?? High Risk Drug therapy requiring intensive monitoring for toxicity: eg steroids, pressors, antibiotics  ?? Reviewed and/or ordered Clinical lab tests  ?? Reviewed and/or ordered Radiology tests  ?? Reviewed and/or ordered of Medicine tests  ?? Independently visualized radiologic Images  ?? I have personally reviewed the patients ECG / Telemetry    Thayer Headings, MD

## 2016-12-07 NOTE — ED Notes (Signed)
RT at bedside for ordered ABG.

## 2016-12-07 NOTE — ED Notes (Signed)
Bedside and Verbal shift change report given to Vivia BudgeMadalyn W., RN (oncoming nurse) by Lennart PallBrittany M., RN (offgoing nurse). Report included the following information SBAR and ED Summary.

## 2016-12-07 NOTE — ED Notes (Signed)
MD Heimbach at bedside to evaluate patient.    Bedside and Verbal shift change report given to Madalyn, Charity fundraiserN (Cabin crewoncoming nurse) by Lennart PallBrittany M, RN (offgoing nurse). Report included the following information SBAR, Kardex, ED Summary, MAR, Recent Results, Med Rec Status and Cardiac Rhythm Sinus Brady- NSR.

## 2016-12-07 NOTE — ED Provider Notes (Signed)
EMERGENCY DEPARTMENT HISTORY AND PHYSICAL EXAM      Date: 12/07/2016  Patient Name: Lindsay [name]    History of Presenting Illness     Chief Complaint   Patient presents with   ??? Chest Pain     pt reports she was seen yesterday for chest pain and shortness of breath, reports her oxygen sats were low during the night, continue with symptoms       History Provided By: Patient    HPI: Lindsay [name], 61 y.o. female with PMHx significant for PE, DVT, TIA, arthritis, presents ambulatory to the ED with cc of bettering SOB since yesterday morning with associated CP that she describes as "heaviness" that is alleviated with pain medication and back pain. Pt states that she was seen yesterday for the same complaints at [facility] and had an elevated d-dimer so she was referred to the ED, where she had unremarkable labs (CBC/CMP/troponin/BNP) and imaging. She also states that she was taken off of anticoagulants (Xarelto) in April by her doctor. Pt also notes that her head "feels funny". She has never been a smoker and takes daily ASA. Pt reports an oxygen reading of 81-82% last night with her device. She denies any recent prolonged travel, surgeries, hospitalizations, surgeries, or any hx of lung problems. Pt also denies any fever, chills, leg swelling, coughing, or wheezing.    There are no other complaints, changes, or physical findings at this time.    PCP: Lindsay Critchley, MD    Current Facility-Administered Medications   Medication Dose Route Frequency Provider Last Rate Last Dose   ??? albuterol-ipratropium (DUO-NEB) 2.5 MG-0.5 MG/3 ML  3 mL Nebulization ONCE Lindsay Fenster, MD       ??? diazePAM (VALIUM) tablet 5 mg  5 mg Oral NOW Lindsay Barnhard, MD       ??? sodium chloride 0.9 % bolus infusion 1,000 mL  1,000 mL IntraVENous NOW Lindsay Bali, MD         Current Outpatient Prescriptions   Medication Sig Dispense Refill   ??? traMADol (ULTRAM) 50 mg tablet Take 1 Tab by mouth every six (6) hours  as needed for Pain. Max Daily Amount: 200 mg. 20 Tab 0   ??? rOPINIRole (REQUIP) 1 mg tablet Take 4 tabs 1-3 hours before bedtime. 120 Tab 2   ??? pravastatin (PRAVACHOL) 40 mg tablet Take 1 Tab by mouth nightly. 90 Tab 0   ??? diazePAM (VALIUM) 5 mg tablet Take 1 Tab by mouth three (3) times daily.  5   ??? gabapentin enacarbil (HORIZANT) 600 mg TbER Take 1 Tab by mouth every evening. 30 Tab 11   ??? aspirin 81 mg chewable tablet Take 1 Tab by mouth daily. 30 Tab 0   ??? ergocalciferol (ERGOCALCIFEROL) 50,000 unit capsule Take 1 Cap by mouth every seven (7) days. 12 Cap 1   ??? escitalopram oxalate (LEXAPRO) 20 mg tablet Take 1 Tab by mouth daily. 30 Tab 5   ??? omeprazole (PRILOSEC) 20 mg capsule Take 20 mg by mouth nightly.         Past History     Past Medical History:  Past Medical History:   Diagnosis Date   ??? Abnormal brain scan    ??? Arthritis    ??? Chronic pain    ??? Depression    ??? DVT (deep venous thrombosis) (HCC)    ??? Falls frequently    ??? Fracture of left ankle    ??? Headache(784.0) 2011   ???  Ill-defined condition 09/15/2015    FX R HAND   ??? Other and unspecified hyperlipidemia 12/27/2012   ??? PE (pulmonary embolism) 12/2012   ??? RLS (restless legs syndrome) 03/16/2016    Dr Scarlett Presto   ??? TIA (transient ischemic attack) 03/2016    Right facial droop, numbness in lip   ??? Torn rotator cuff     right       Past Surgical History:  Past Surgical History:   Procedure Laterality Date   ??? BREAST SURGERY PROCEDURE UNLISTED  2002    AUGMENTATION   ??? HX APPENDECTOMY     ??? HX CESAREAN SECTION      x4   ??? HX GI      COLONOSCOPY   ??? 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 HX ORTHOPAEDIC  08/2004    rotater cuff r. shoulder   ??? HX ORTHOPAEDIC      left ankle fracture treated with air cast   ??? HX ORTHOPAEDIC  6/13    left shoulder fxt and dislocation   ??? HX OTHER SURGICAL  2010    left foot torn tendon   ??? HX TUBAL LIGATION         Family History:  Family History   Problem Relation Age of Onset   ??? Heart Disease Mother     ??? Heart Disease Father    ??? Cancer Father      thyroid cancer   ??? Arthritis-rheumatoid Sister    ??? No Known Problems Son    ??? No Known Problems Daughter    ??? No Known Problems Daughter    ??? No Known Problems Daughter    ??? Anesth Problems Neg Hx        Social History:  Social History   Substance Use Topics   ??? Smoking status: Never Smoker   ??? Smokeless tobacco: Never Used   ??? Alcohol use 8.4 oz/week     14 Cans of beer per week      Comment: 2 MICHELOB ULTRA PER DAY       Allergies:  No Known Allergies      Review of Systems   Review of Systems   Constitutional: Negative for activity change, chills and fever.   HENT: Negative for congestion and sore throat.    Eyes: Negative for pain and redness.   Respiratory: Positive for shortness of breath. Negative for cough, chest tightness and wheezing.    Cardiovascular: Positive for chest pain. Negative for palpitations and leg swelling.   Gastrointestinal: Negative for abdominal pain, diarrhea, nausea and vomiting.   Genitourinary: Negative for dysuria, frequency and urgency.   Musculoskeletal: Positive for back pain. Negative for neck pain.   Skin: Negative for rash.   Neurological: Negative for syncope, light-headedness and headaches.   Psychiatric/Behavioral: Negative for confusion.   All other systems reviewed and are negative.      Physical Exam   Physical Exam   Constitutional: She is oriented to person, place, and time. She appears well-developed and well-nourished. No distress. Nasal cannula in place.   HENT:   Head: Normocephalic.   Nose: Nose normal.   Mouth/Throat: Oropharynx is clear and moist. No oropharyngeal exudate.   Eyes: Conjunctivae are normal. Pupils are equal, round, and reactive to light. No scleral icterus.   Neck: Normal range of motion. Neck supple. No JVD present. No tracheal deviation present. No thyromegaly present.   Cardiovascular: Normal rate, regular rhythm and intact distal pulses.  Exam  reveals no gallop and no friction rub.     No murmur heard.  Pulmonary/Chest: Effort normal and breath sounds normal. No stridor. No respiratory distress. She has no wheezes. She has no rales.   Abdominal: Soft. Bowel sounds are normal. She exhibits no distension. There is no tenderness. There is no rebound and no guarding.   Musculoskeletal: Normal range of motion. She exhibits no edema.   Lymphadenopathy:     She has no cervical adenopathy.   Neurological: She is alert and oriented to person, place, and time. No cranial nerve deficit. She exhibits normal muscle tone. Coordination normal.   Skin: Skin is warm and dry. No rash noted. She is not diaphoretic. No erythema.   Psychiatric: She has a normal mood and affect. Her behavior is normal.   Nursing note and vitals reviewed.      Diagnostic Study Results     Labs -     Recent Results (from the past 12 hour(s))   EKG, 12 LEAD, INITIAL    Collection Time: 12/07/16  1:57 PM   Result Value Ref Range    Ventricular Rate 65 BPM    Atrial Rate 65 BPM    P-R Interval 138 ms    QRS Duration 76 ms    Q-T Interval 436 ms    QTC Calculation (Bezet) 453 ms    Calculated P Axis 104 degrees    Calculated R Axis 7 degrees    Calculated T Axis 36 degrees    Diagnosis       Normal sinus rhythm  Low voltage QRS  Borderline ECG  When compared with ECG of 06-Dec-2016 10:50,  No significant change was found     BLOOD GAS, ARTERIAL    Collection Time: 12/07/16  3:28 PM   Result Value Ref Range    pH 7.43 7.35 - 7.45      PCO2 42 35.0 - 45.0 mmHg    PO2 93 80 - 100 mmHg    O2 SAT 97 92 - 97 %    BICARBONATE 27 (H) 22 - 26 mmol/L    BASE EXCESS 2.2 mmol/L    O2 METHOD NASAL O2      O2 FLOW RATE 2.00 L/min    SPONTANEOUS RATE 21.0      Sample source ARTERIAL      SITE LEFT RADIAL      ALLEN'S TEST YES         Radiologic Studies -   No orders to display     CT Results  (Last 48 hours)               12/06/16 1342  CTA CHEST W OR W WO CONT Final result    Impression:  IMPRESSION: No evidence of pulmonary embolism or acute  abnormality.       Narrative:  EXAM:  CTA CHEST W OR W WO CONT       INDICATION:   Acute chest pain, upper back pain, and shortness of breath today       COMPARISON: 03/26/2016.       TECHNIQUE:    Precontrast scout images were obtained to localize the volume for acquisition.   Multislice helical CT arteriography was performed from the diaphragm to the   thoracic inlet during uneventful rapid bolus of 80 cc Isovue-370. Lung and soft   tissue windows were generated.  Coronal and sagittal images were generated and   3D post processing consisting of coronal maximum intensity  images was performed.     CT dose reduction was achieved through use of a standardized protocol tailored   for this examination and automatic exposure control for dose modulation.         FINDINGS:   CHEST:   THYROID: No nodule.   MEDIASTINUM: No mass or lymphadenopathy.   HILA: No mass or lymphadenopathy.   THORACIC AORTA: No dissection or aneurysm.   MAIN PULMONARY ARTERY: No evidence of pulmonary embolism.   TRACHEA/BRONCHI: Patent.   ESOPHAGUS: No wall thickening or dilatation.   HEART: Normal in size.   PLEURA: No effusion or pneumothorax.   LUNGS: No nodule, mass, or airspace disease.   INCIDENTALLY IMAGED UPPER ABDOMEN: No focal abnormality.   BONES: Degenerative changes are seen in the thoracic spine.                   CXR Results  (Last 48 hours)               12/06/16 1134  XR CHEST PA LAT Final result    Impression:  IMPRESSION: No acute abnormality and no change.           Narrative:  EXAM:  XR CHEST PA LAT.   INDICATION: Chest pain.   COMPARISON: 05/04/2010.       FINDINGS:    PA and lateral radiographs of the chest were obtained.   Lungs: The lungs are clear of mass, nodule, airspace disease or edema.   Pleura: There is no pleural effusion or pneumothorax.   Mediastinum: The cardiac and mediastinal contours and pulmonary vascularity are   normal.   Bones and soft tissues: The bones and soft tissues are within normal limits.                    Medical Decision Making   I am the first provider for this patient.    I reviewed the vital signs, available nursing notes, past medical history, past surgical history, family history and social history.    Vital Signs-Reviewed the patient's vital signs.  Patient Vitals for the past 12 hrs:   Temp Pulse Resp BP SpO2   12/07/16 1531 - 67 19 115/79 100 %   12/07/16 1456 - 66 25 - 95 %   12/07/16 1415 - - - 115/77 91 %   12/07/16 1348 98.6 ??F (37 ??C) 71 16 109/71 97 %       Pulse Oximetry Analysis - 94% on 2L NC    Cardiac Monitor:   Rate: 75 bpm  Rhythm: Normal Sinus Rhythm 115/79     EKG interpretation: (Preliminary)  13:57  Rhythm: normal sinus rhythm; and regular . Rate (approx.): 65; Axis: normal; PR interval: normal; QRS interval: normal ; ST/T wave: non-specific changes; Other findings: normal.  Written by Ignacia Palma, ED Scribe, as dictated by Lindsay Bali, MD.    Records Reviewed: Nursing Notes, Old Medical Records, Previous electrocardiograms, Previous Radiology Studies and Previous Laboratory Studies    Provider Notes (Medical Decision Making):   DDx: PE, ACS, PNA, bronchitis    ED Course:   Initial assessment performed. The patients presenting problems have been discussed, and they are in agreement with the care plan formulated and outlined with them.  I have encouraged them to ask questions as they arise throughout their visit.    CONSULT NOTE:   3:39 PM  Lindsay Bali, MD spoke with Lou Cal, MD   Specialty: Hospitalist  Discussed pt's hx, disposition, and  available diagnostic and imaging results. Reviewed care plans. Consultant will evaluate pt for admission. He also recommends consulting pulmonology.   Written by Ignacia Palma, ED Scribe, as dictated by Lindsay Bali, MD.    CONSULT NOTE:  4:18 PM  Lindsay Bali, MD spoke with Rudean Haskell, MD  Specialty: Pulmonology  Discussed patient's hx, disposition, and available diagnostic and imaging  results.  Reviewed care plans.  Consultant agrees with plans as outlined. He will evaluate the pt.    4:23 PM  Pt's old records reviewed, pt had echo with Dr. Dorna Bloom in 03/06/2016 which was grossly normal.    Disposition:  ADMIT NOTE:  3:47 PM  Patient is being admitted to the hospital by Dr. Allena Katz.  The results of their tests and reasons for their admission have been discussed with them and/or available family.  They convey agreement and understanding for the need to be admitted and for their admission diagnosis.  Consultation has been made with the inpatient physician specialist for hospitalization.      Will admit to hospitalist for hypoxia of unknown origin; cta chest negative for pe yesterday; hx of multiple prior dvt/pe; currently not on any anticoagulation; consulted pulmonary; abg grossly normal; normal bnp; cbc wnl; cmp grossly unremarkable; Lindsay Bali, MD      PLAN:  Admit to Hospitalist    Diagnosis     Clinical Impression:   1. Acute respiratory failure with hypoxia (HCC)        Attestations:    This note is prepared by Ignacia Palma, acting as Scribe for Lindsay Bali, MD.    Lindsay Bali, MD: The scribe's documentation has been prepared under my direction and personally reviewed by me in its entirety. I confirm that the note above accurately reflects all work, treatment, procedures, and medical decision making performed by me.

## 2016-12-07 NOTE — ED Notes (Signed)
Dr. Wong at bedside.

## 2016-12-07 NOTE — Progress Notes (Signed)
6:39 PM   TRANSFER - IN REPORT:    Verbal report received from Madalyn (name) on Rosalene BillingsKathy A Bondar  being received from ED (unit) for routine progression of care      Report consisted of patient???s Situation, Background, Assessment and   Recommendations(SBAR).     Information from the following report(s) SBAR, Kardex, ED Summary, Recent Results and Cardiac Rhythm NSR was reviewed with the receiving nurse.    Opportunity for questions and clarification was provided.

## 2016-12-07 NOTE — Other (Signed)
TRANSFER - OUT REPORT:    Verbal report given to Arati, RN(name) on Lindsay [name]  being transferred to [facility](unit) for routine progression of care       Report consisted of patient???s Situation, Background, Assessment and   Recommendations(SBAR).     Information from the following report(s) SBAR, ED Summary, [facility] and Recent Results was reviewed with the receiving nurse.    Lines:   Peripheral IV 12/07/16 Left Antecubital (Active)   Site Assessment Clean, dry, & intact 12/07/2016  2:22 PM   Phlebitis Assessment 0 12/07/2016  2:22 PM   Infiltration Assessment 0 12/07/2016  2:22 PM   Dressing Status Clean, dry, & intact 12/07/2016  2:22 PM   Dressing Type Transparent;Tape 12/07/2016  2:22 PM   Hub Color/Line Status Pink;Flushed 12/07/2016  2:22 PM   Action Taken Blood drawn 12/07/2016  2:22 PM        Opportunity for questions and clarification was provided.

## 2016-12-08 LAB — METABOLIC PANEL, BASIC
Anion gap: 9 mmol/L (ref 5–15)
BUN/Creatinine ratio: 12 (ref 12–20)
BUN: 11 MG/DL (ref 6–20)
CO2: 27 mmol/L (ref 21–32)
Calcium: 8.8 MG/DL (ref 8.5–10.1)
Chloride: 104 mmol/L (ref 97–108)
Creatinine: 0.9 MG/DL (ref 0.55–1.02)
GFR est AA: >60 mL/min/{1.73_m2} (ref >60)
GFR est non-AA: >60 mL/min/{1.73_m2} (ref >60)
Glucose: 103 mg/dL — ABNORMAL HIGH (ref 65–100)
Potassium: 3.9 mmol/L (ref 3.5–5.1)
Sodium: 140 mmol/L (ref 136–145)

## 2016-12-08 LAB — CBC WITH AUTOMATED DIFF
ABS. BASOPHILS: 0.1 10*3/uL (ref 0.0–0.1)
ABS. EOSINOPHILS: 0.2 10*3/uL (ref 0.0–0.4)
ABS. IMM. GRANS.: 0 10*3/uL (ref 0.00–0.04)
ABS. LYMPHOCYTES: 2.1 10*3/uL (ref 0.8–3.5)
ABS. MONOCYTES: 0.4 10*3/uL (ref 0.0–1.0)
ABS. NEUTROPHILS: 3.7 10*3/uL (ref 1.8–8.0)
ABSOLUTE NRBC: 0 10*3/uL (ref 0.00–0.01)
BASOPHILS: 1 % (ref 0–1)
EOSINOPHILS: 3 % (ref 0–7)
HCT: 29.8 % — ABNORMAL LOW (ref 35.0–47.0)
HGB: 9.3 g/dL — ABNORMAL LOW (ref 11.5–16.0)
IMMATURE GRANULOCYTES: 0 % (ref 0.0–0.5)
LYMPHOCYTES: 33 % (ref 12–49)
MCH: 24.2 PG — ABNORMAL LOW (ref 26.0–34.0)
MCHC: 31.2 g/dL (ref 30.0–36.5)
MCV: 77.6 FL — ABNORMAL LOW (ref 80.0–99.0)
MONOCYTES: 6 % (ref 5–13)
MPV: 8.8 FL — ABNORMAL LOW (ref 8.9–12.9)
NEUTROPHILS: 57 % (ref 32–75)
NRBC: 0 PER 100 WBC
PLATELET: 234 10*3/uL (ref 150–400)
RBC: 3.84 M/uL (ref 3.80–5.20)
RDW: 14.9 % — ABNORMAL HIGH (ref 11.5–14.5)
WBC: 6.4 10*3/uL (ref 3.6–11.0)

## 2016-12-08 MED ORDER — ESCITALOPRAM 10 MG TAB
10 mg | Freq: Every day | ORAL | Status: DC
Start: 2016-12-08 — End: 2016-12-08
  Administered 2016-12-08: 13:00:00 via ORAL

## 2016-12-08 MED ORDER — TRAMADOL 50 MG TAB
50 mg | Freq: Four times a day (QID) | ORAL | Status: DC | PRN
Start: 2016-12-08 — End: 2016-12-08
  Administered 2016-12-08: 07:00:00 via ORAL

## 2016-12-08 MED ORDER — ASPIRIN 81 MG CHEWABLE TAB
81 mg | Freq: Every evening | ORAL | Status: DC
Start: 2016-12-08 — End: 2016-12-10
  Administered 2016-12-09 – 2016-12-10 (×3): via ORAL

## 2016-12-08 MED ORDER — ESCITALOPRAM 10 MG TAB
10 mg | Freq: Every evening | ORAL | Status: DC
Start: 2016-12-08 — End: 2016-12-10
  Administered 2016-12-09 – 2016-12-10 (×3): via ORAL

## 2016-12-08 MED ORDER — PANTOPRAZOLE 40 MG TAB, DELAYED RELEASE
40 mg | Freq: Every evening | ORAL | Status: DC
Start: 2016-12-08 — End: 2016-12-10
  Administered 2016-12-09 – 2016-12-10 (×3): via ORAL

## 2016-12-08 MED ORDER — PRAVASTATIN 40 MG TAB
40 mg | Freq: Every evening | ORAL | Status: DC
Start: 2016-12-08 — End: 2016-12-10
  Administered 2016-12-09 – 2016-12-10 (×3): via ORAL

## 2016-12-08 MED ORDER — ERGOCALCIFEROL (VITAMIN D2) 50,000 UNIT CAP
1250 mcg (50,000 unit) | ORAL | Status: DC
Start: 2016-12-08 — End: 2016-12-10
  Administered 2016-12-09: 01:00:00 via ORAL

## 2016-12-08 MED ORDER — ESCITALOPRAM 10 MG TAB
10 mg | Freq: Once | ORAL | Status: DC
Start: 2016-12-08 — End: 2016-12-08

## 2016-12-08 MED ORDER — TRAMADOL 50 MG TAB
50 mg | Freq: Four times a day (QID) | ORAL | Status: DC | PRN
Start: 2016-12-08 — End: 2016-12-10
  Administered 2016-12-08: 14:00:00 via ORAL

## 2016-12-08 MED ORDER — ASPIRIN 81 MG CHEWABLE TAB
81 mg | Freq: Every day | ORAL | Status: DC
Start: 2016-12-08 — End: 2016-12-08
  Administered 2016-12-08: 13:00:00 via ORAL

## 2016-12-08 MED ORDER — ERGOCALCIFEROL (VITAMIN D2) 50,000 UNIT CAP
1250 mcg (50,000 unit) | ORAL | Status: DC
Start: 2016-12-08 — End: 2016-12-08
  Administered 2016-12-08: 13:00:00 via ORAL

## 2016-12-08 MED ORDER — ROPINIROLE 1 MG TAB
1 mg | Freq: Every evening | ORAL | Status: DC | PRN
Start: 2016-12-08 — End: 2016-12-08

## 2016-12-08 MED ORDER — DIAZEPAM 5 MG TAB
5 mg | Freq: Every evening | ORAL | Status: DC
Start: 2016-12-08 — End: 2016-12-10
  Administered 2016-12-09 – 2016-12-10 (×3): via ORAL

## 2016-12-08 MED ORDER — PANTOPRAZOLE 40 MG TAB, DELAYED RELEASE
40 mg | Freq: Every day | ORAL | Status: DC
Start: 2016-12-08 — End: 2016-12-08
  Administered 2016-12-08: 13:00:00 via ORAL

## 2016-12-08 MED ORDER — GABAPENTIN ENACARBIL ER 600 MG TABLET,EXTENDED RELEASE
600 mg | Freq: Every evening | ORAL | Status: DC
Start: 2016-12-08 — End: 2016-12-10
  Administered 2016-12-09 – 2016-12-10 (×2): via ORAL

## 2016-12-08 MED FILL — BD POSIFLUSH NORMAL SALINE 0.9 % INJECTION SYRINGE: INTRAMUSCULAR | Qty: 10

## 2016-12-08 MED FILL — TRAMADOL 50 MG TAB: 50 mg | ORAL | Qty: 1

## 2016-12-08 MED FILL — LOVENOX 40 MG/0.4 ML SUBCUTANEOUS SYRINGE: 40 mg/0.4 mL | SUBCUTANEOUS | Qty: 0.4

## 2016-12-08 NOTE — Consults (Signed)
[facility] Consult Service Note  Pulmonary, Critical Care, and Sleep Medicine    Name: Lindsay [name] MRN: [id number]   DOB: 1955/08/03 Hospital: [facility]   Date: 12/08/2016   Hospital Day: 2       Subjective/Interval History:   I have reviewed the notes from other providers and old records readily available and summarized findings below with the flowsheet. Seen earlier today on rounds.  Last 24 hrs: Feels that her breathing is easier, still some mild right lower chest pain. NO sinus pain or pressure.   NO Headaches. No overt chest pain or pressure.  NO GERD.   NO leg pain or swelling.  NO acute skin changes  Denies snoring, no nocturnal awakenings.                   Excerpts from admission and consult notes reviewed as follows:     Hospital Problems  Date Reviewed: Oct 31, 2016          Codes Class Noted POA    Respiratory failure, acute (HCC) ICD-10-CM: J96.00  ICD-9-CM: 518.81  12/07/2016 Unknown              IMPRESSION:   1. Acute respiratory failure with hypoxia now improved. Pox is 91% on RA at rest.   2. Atypical chest pain, now seems better as well.   3. Anemia- more than I would expect and might be contributing to sx  4. 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   5. H/o bilateral TKR  Body mass index is 31.36 kg/(m^2).  6. Additional workup outlined below  1. Discussed with Lindsay [name].       RECOMMENDATIONS/PLAN:   2. Supplemental O2 to keep sats > 93%  3. Echo may not be done today due to back log.   4. Will check overnight oximetry to see if pt needs home oxygen.   5. Incentive spirometer  6. Further recommendations pending findings          My assessment/management discussed with: Consultants, Nursing, Pharmacy, Case Management, PT, OT, Respiratory Therapy, Hospitalist and Family for coordination of care    Pt's condition is acute and  unstable requiring inpatient hospitalization. This care involved high complexity decision making which includes independently reviewing the patient's past medical records, current laboratory results, medication profiles that were immediately available to me and actual Xray images at the bedside in order to assess, support vital system function, and to treat this degree of vital organ system failure, and to prevent further deterioration of the patient???s condition.     Risk of deterioration: medium and high   [x]  High complexity decision making was performed  [x]  See my orders for details  Tubes:       Subjective/Initial History:     I was asked by Lindsay Jakes, DO to see Lindsay [name]  a 61 y.o.  Caucasian female in consultation for a chief complaint of acute respiratory failure with hypoxia    Pt is a 61yo WF life long non smoker with h/o recurrent thromboembolic disease who presented to ER for second time on two days with c/o chest and back pain associated with hypoxia, sats 81-82% on room air. Pt awoke yesterday feeling ok but devloped lightheadedness, SOb with heaviness in lower anterior ribs and in her posterior lung fields. Non pleurtic. Went to Pt  first and sent to [facility] ER. CXR and CTa chest did not show acute problems, No PE. Pt sent home with pain meds and did ok overnight until this AM. Sats no better. Felt same and came back I was asked to see pt for Lindsay [name] in consultaion. Sats indeed were low but responded to nasal O2. No wheeze. No URI or GI sx. No palpitations of congestion. Pt reports checking her sat three weeks ago after exertions and sats 97%!! No cardiac problems.    In 2014 pt had unprovoked right lung PE. Negative workup for thrombophilia and was not on estrogen products. InApril 2017 had left TKR without incident. June 2017 had right TKR and later developed RLE DVT and bilateral PE found and treated at [facility]. Pt started on Xarelto for three - four months. Repeat dopplers showed  right LE DVT resolved but LLE DVT diagnosed. REstarted on Xarelto Sept 2017. Lindsay [name] stopped April 2018. She was without compliants at that time.     PCP: LindsayFrancesca Darl [name]    No Known Allergies     MAR reviewed and pertinent medications noted or modified as needed     Current Facility-Administered Medications   Medication   ??? escitalopram oxalate (LEXAPRO) tablet 20 mg   ??? .PHARMACY TO SUBSTITUTE PER PROTOCOL   ??? pravastatin (PRAVACHOL) tablet 40 mg   ??? rOPINIRole (REQUIP) tablet 1 mg   ??? diazePAM (VALIUM) tablet 10 mg   ??? traMADol (ULTRAM) tablet 50 mg   ??? pantoprazole (PROTONIX) tablet 40 mg   ??? aspirin chewable tablet 81 mg   ??? ergocalciferol (ERGOCALCIFEROL) capsule 50,000 Units   ??? escitalopram oxalate (LEXAPRO) tablet 20 mg   ??? sodium chloride (NS) flush 5-10 mL   ??? sodium chloride (NS) flush 5-10 mL   ??? enoxaparin (LOVENOX) injection 40 mg      PMH:  has a past medical history of Abnormal brain scan; Arthritis; Chronic pain; Depression; DVT (deep venous thrombosis) (HCC); Falls frequently; Fracture of left ankle; BJYNWGNF(621.3) (2011); Ill-defined condition (09/15/2015); Other and unspecified hyperlipidemia (12/27/2012); PE (pulmonary embolism) (12/2012); RLS (restless legs syndrome) (03/16/2016); TIA (transient ischemic attack) (03/2016); and Torn rotator cuff. She also has no past medical history of Complication of anesthesia.  PSH:   has a past surgical history that includes hx appendectomy; pr breast surgery procedure unlisted (2002); hx gi; hx cesarean section; hx tubal ligation; hx orthopaedic (08/2004); hx orthopaedic; hx orthopaedic (6/13); hx other surgical (2010); and hx knee replacement (Left, 10/29/2015).   FHX: family history includes Arthritis-rheumatoid in her sister; Cancer in her father; Heart Disease in her father and mother; No Known Problems in her daughter, daughter, daughter, and son. There is no history of Anesth Problems.   SHX:  reports that she has never smoked. She has never used  smokeless tobacco. She reports that she drinks about 8.4 oz of alcohol per week  She reports that she does not use illicit drugs.     ROS:A comprehensive review of systems was negative except for that written in the HPI.    Objective:     Vital Signs: Telemetry:    normal sinus rhythm Intake/Output:   Visit Vitals   ??? BP 104/53 (BP 1 Location: Right arm, BP Patient Position: At rest)   ??? Pulse 72   ??? Temp 97.8 ??F (36.6 ??C)   ??? Resp 20   ??? Ht 5\' 3"  (1.6 m)   ??? Wt 80.3 kg (177 lb 0.5 oz)   ??? SpO2  92%   ??? BMI 31.36 kg/m2       Temp (24hrs), Avg:98 ??F (36.7 ??C), Min:97.7 ??F (36.5 ??C), Max:98.3 ??F (36.8 ??C)        O2 Device: Room air O2 Flow Rate (L/min): 2 l/min       Wt Readings from Last 4 Encounters:   12/07/16 80.3 kg (177 lb 0.5 oz)   12/06/16 80.2 kg (176 lb 12.9 oz)   10/06/16 79.8 kg (176 lb)   09/15/16 76.7 kg (169 lb)          Intake/Output Summary (Last 24 hours) at 12/08/16 1357  Last data filed at 12/08/16 1046   Gross per 24 hour   Intake              240 ml   Output              500 ml   Net             -260 ml       Last shift:      06/06 0701 - 06/06 1900  In: 240 [P.O.:240]  Out: 500 [Urine:500]  Last 3 shifts:         Physical Exam:    General:  Caucasian female; alert and oriented times 3 currently on RA.    HEAD: Normocephalic, without obvious abnormality, atraumatic   EYES: conjunctivae clear. PERRL,  AN Icteric sclerae   NOSE: nares normal, no drainage, no nasal flaring,    THROAT: normal; Lips, mucosa dry; No Thrush; class 3-4 airway; tongue midline   Neck: Supple, symmetrical, trachea midline,  No accessory mm use; No Stridor/ cuff leak, No goiter or thyroid tenderness   LYMPH: No abnormally enlarged lymph nodes. in neck or groin   Chest: normal   Lungs: decreased air exchange bibasilar but appears clear. Good air movement.    Heart: Regular rate and rhythm; NO edema   Abdomen: soft, non-tender, without masses or organomegaly, protuberant   GU: no Foley    Musculoskeletal: NO kyphosis; No spine  or CVA tenderness; negative, cyanosis, clubbing; no joint swelling or erythema   Neuro: alert; speech fluent ;withdraws to pain; does follow simple commands   Psych: oriented to time, place and person;  No agitation;  normal affect;    Skin: Skin unremarkable;    Pulses:Bilateral, Radial, 2+   Capillary refill: normal; well perfused,    Data:     Lab results reviewed. For significant abnormal values and values requiring intervention, see assessment and plan.             Labs:    Recent Labs      12/08/16   0306  12/07/16   1538  12/06/16   1122   WBC  6.4  6.6  7.7   HGB  9.3*  9.9*  10.4*   PLT  234  256  259     Recent Labs      12/08/16   0306  12/07/16   1538  12/06/16   1122   NA  140  140  139   K  3.9  4.1  4.2   CL  104  105  105   CO2  27  29  27    GLU  103*  87  97   BUN  11  13  16    CREA  0.90  0.88  0.86   CA  8.8  9.0  9.3   ALB   --  3.7  3.9   SGOT   --   20  17   ALT   --   21  22     Recent Labs      12/07/16   1528   PH  7.43   PCO2  42   PO2  93   HCO3  27*     Recent Labs      12/06/16   1122   CPK  219*   CKNDX  1.3   TROIQ  <0.04     No results found for: BNPP, BNP   Lab Results   Component Value Date/Time    Culture result: MRSA NOT PRESENT 12/10/2015 10:30 AM    Culture result:  12/10/2015 10:30 AM         Screening of patient nares for MRSA is for surveillance purposes and, if positive, to facilitate isolation considerations in high risk settings. It is not intended for automatic decolonization interventions per se as regimens are not sufficiently effective to warrant routine use.    Culture result: MRSA NOT PRESENT 10/27/2015 09:05 AM    Culture result:  10/27/2015 09:05 AM         Screening of patient nares for MRSA is for surveillance purposes and, if positive, to facilitate isolation considerations in high risk settings. It is not intended for automatic decolonization interventions per se as regimens are not sufficiently effective to warrant routine use.     Lab Results   Component  Value Date/Time    TSH 2.080 09/15/2016 01:42 PM       Imaging:            Results from Hospital Encounter encounter on 12/06/16   XR CHEST PA LAT   Narrative EXAM:  XR CHEST PA LAT.  INDICATION: Chest pain.  COMPARISON: 05/04/2010.    FINDINGS:   PA and lateral radiographs of the chest were obtained.  Lungs: The lungs are clear of mass, nodule, airspace disease or edema.  Pleura: There is no pleural effusion or pneumothorax.  Mediastinum: The cardiac and mediastinal contours and pulmonary vascularity are  normal.  Bones and soft tissues: The bones and soft tissues are within normal limits.         Impression IMPRESSION: No acute abnormality and no change.            Results from Hospital Encounter encounter on 12/06/16   CTA CHEST W OR W WO CONT   Narrative EXAM:  CTA CHEST W OR W WO CONT    INDICATION:   Acute chest pain, upper back pain, and shortness of breath today    COMPARISON: 03/26/2016.    TECHNIQUE:   Precontrast scout images were obtained to localize the volume for acquisition.  Multislice helical CT arteriography was performed from the diaphragm to the  thoracic inlet during uneventful rapid bolus of 80 cc Isovue-370. Lung and soft  tissue windows were generated.  Coronal and sagittal images were generated and  3D post processing consisting of coronal maximum intensity images was performed.   CT dose reduction was achieved through use of a standardized protocol tailored  for this examination and automatic exposure control for dose modulation.      FINDINGS:  CHEST:  THYROID: No nodule.  MEDIASTINUM: No mass or lymphadenopathy.  HILA: No mass or lymphadenopathy.  THORACIC AORTA: No dissection or aneurysm.  MAIN PULMONARY ARTERY: No evidence of pulmonary embolism.  TRACHEA/BRONCHI: Patent.  ESOPHAGUS: No wall thickening or dilatation.  HEART:  Normal in size.  PLEURA: No effusion or pneumothorax.  LUNGS: No nodule, mass, or airspace disease.  INCIDENTALLY IMAGED UPPER ABDOMEN: No focal abnormality.  BONES:  Degenerative changes are seen in the thoracic spine.           Impression IMPRESSION: No evidence of pulmonary embolism or acute abnormality.          I have personally and independently reviewed the patient???s interval and diagnostic data, radiographs and have reviewed the reports.    I have ordered additional labs to follow the current medical conditions and to monitor treatment responses over the next 24 hours or sooner if needed. If the pt needs,  additional imaging will be obtained to follow longitudinal changes found on the most current imaging.      Medical Decision Making Today:  ?? Reviewed the flowsheet and previous days notes  ?? Reviewed and summarized records or history from previous days note or discussions with staff, family  ?? Parenteral controlled substances - Reviewed/ Adjusted / Weaned / Started  ?? High Risk Drug therapy requiring intensive monitoring for toxicity: eg steroids, pressors, antibiotics  ?? Reviewed and/or ordered Clinical lab tests    Constance Goltz, MD

## 2016-12-08 NOTE — Consults (Signed)
[facility] Consult Service Note  Pulmonary, Critical Care, and Sleep Medicine    Name: Lindsay [name] MRN: [id number]   DOB: 1955/11/04 Hospital: [facility]   Date: 12/08/2016   Hospital Day: 2       Subjective/Interval History:   I have reviewed the notes from other providers and old records readily available and summarized findings below with the flowsheet. Seen earlier today on rounds.  Last 24 hrs: Feels that her breathing is easier, still some mild right lower chest pain. NO sinus pain or pressure.   NO Headaches. No overt chest pain or pressure.  NO GERD.   NO leg pain or swelling.  NO acute skin changes  Denies snoring, no nocturnal awakenings.                   Excerpts from admission and consult notes reviewed as follows:     Hospital Problems  Date Reviewed: 10-08-2016          Codes Class Noted POA    Respiratory failure, acute (HCC) ICD-10-CM: J96.00  ICD-9-CM: 518.81  12/07/2016 Unknown              IMPRESSION:   1. Acute respiratory failure with hypoxia now improved. Pox is 91% on RA at rest.   2. Atypical chest pain, now seems better as well.   3. Anemia- more than I would expect and might be contributing to sx  4. 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   5. H/o bilateral TKR  Body mass index is 31.36 kg/(m^2).  6. Additional workup outlined below  1. Discussed with Dr. Modesto Charon.       RECOMMENDATIONS/PLAN:   2. Supplemental O2 to keep sats > 93%  3. Echo may not be done today due to back log.   4. Will check overnight oximetry to see if pt needs home oxygen.   5. Incentive spirometer  6. Further recommendations pending findings          My assessment/management discussed with: Consultants, Nursing, Pharmacy, Case Management, PT, OT, Respiratory Therapy, Hospitalist and Family for coordination of care     Pt's condition is acute and unstable requiring inpatient hospitalization. This care involved high complexity decision making which includes independently reviewing the patient's past medical records, current laboratory results, medication profiles that were immediately available to me and actual Xray images at the bedside in order to assess, support vital system function, and to treat this degree of vital organ system failure, and to prevent further deterioration of the patient???s condition.     Risk of deterioration: medium and high   [x]  High complexity decision making was performed  [x]  See my orders for details  Tubes:       Subjective/Initial History:     I was asked by [name], DO to see Lindsay [name]  a 61 y.o.  Caucasian female in consultation for a chief complaint of acute respiratory failure with hypoxia    Pt is a 61yo WF life long non smoker with h/o recurrent thromboembolic disease who presented to ER for second time on two days with c/o chest and back pain associated with hypoxia, sats 81-82% on room air. Pt awoke yesterday feeling ok but devloped lightheadedness, SOb with heaviness in lower anterior ribs and in her posterior lung fields. Non pleurtic. Went to Pt  first and sent to [facility] ER. CXR and CTa chest did not show acute problems, No PE. Pt sent home with pain meds and did ok overnight until this AM. Sats no better. Felt same and came back I was asked to see pt for Dr. Hetty Ely in consultaion. Sats indeed were low but responded to nasal O2. No wheeze. No URI or GI sx. No palpitations of congestion. Pt reports checking her sat three weeks ago after exertions and sats 97%!! No cardiac problems.    In 2014 pt had unprovoked right lung PE. Negative workup for thrombophilia and was not on estrogen products. InApril 2017 had left TKR without incident. June 2017 had right TKR and later developed RLE DVT and bilateral PE found and treated at [facility]. Pt started on Xarelto for three -  four months. Repeat dopplers showed right LE DVT resolved but LLE DVT diagnosed. REstarted on Xarelto Sept 2017. Lindsay [name] stopped April 2018. She was without compliants at that time.     PCP: Dr.Francesca Darl Pikes    No Known Allergies     MAR reviewed and pertinent medications noted or modified as needed     Current Facility-Administered Medications   Medication   ??? escitalopram oxalate (LEXAPRO) tablet 20 mg   ??? .PHARMACY TO SUBSTITUTE PER PROTOCOL   ??? pravastatin (PRAVACHOL) tablet 40 mg   ??? rOPINIRole (REQUIP) tablet 1 mg   ??? diazePAM (VALIUM) tablet 10 mg   ??? traMADol (ULTRAM) tablet 50 mg   ??? pantoprazole (PROTONIX) tablet 40 mg   ??? aspirin chewable tablet 81 mg   ??? ergocalciferol (ERGOCALCIFEROL) capsule 50,000 Units   ??? escitalopram oxalate (LEXAPRO) tablet 20 mg   ??? sodium chloride (NS) flush 5-10 mL   ??? sodium chloride (NS) flush 5-10 mL   ??? enoxaparin (LOVENOX) injection 40 mg      PMH:  has a past medical history of Abnormal brain scan; Arthritis; Chronic pain; Depression; DVT (deep venous thrombosis) (HCC); Falls frequently; Fracture of left ankle; ZOXWRUEA(540.9) (2011); Ill-defined condition (09/15/2015); Other and unspecified hyperlipidemia (12/27/2012); PE (pulmonary embolism) (12/2012); RLS (restless legs syndrome) (03/16/2016); TIA (transient ischemic attack) (03/2016); and Torn rotator cuff. She also has no past medical history of Complication of anesthesia.  PSH:   has a past surgical history that includes hx appendectomy; pr breast surgery procedure unlisted (2002); hx gi; hx cesarean section; hx tubal ligation; hx orthopaedic (08/2004); hx orthopaedic; hx orthopaedic (6/13); hx other surgical (2010); and hx knee replacement (Left, 10/29/2015).   FHX: family history includes Arthritis-rheumatoid in her sister; Cancer in her father; Heart Disease in her father and mother; No Known Problems in her daughter, daughter, daughter, and son. There is no history of Anesth Problems.    SHX:  reports that she has never smoked. She has never used smokeless tobacco. She reports that she drinks about 8.4 oz of alcohol per week  She reports that she does not use illicit drugs.     ROS:A comprehensive review of systems was negative except for that written in the HPI.    Objective:     Vital Signs: Telemetry:    normal sinus rhythm Intake/Output:   Visit Vitals   ??? BP 104/53 (BP 1 Location: Right arm, BP Patient Position: At rest)   ??? Pulse 72   ??? Temp 97.8 ??F (36.6 ??C)   ??? Resp 20   ??? Ht 5\' 3"  (1.6 m)   ??? Wt 80.3 kg (177 lb 0.5 oz)   ??? SpO2  92%   ??? BMI 31.36 kg/m2       Temp (24hrs), Avg:98 ??F (36.7 ??C), Min:97.7 ??F (36.5 ??C), Max:98.3 ??F (36.8 ??C)        O2 Device: Room air O2 Flow Rate (L/min): 2 l/min       Wt Readings from Last 4 Encounters:   12/07/16 80.3 kg (177 lb 0.5 oz)   12/06/16 80.2 kg (176 lb 12.9 oz)   10/06/16 79.8 kg (176 lb)   09/15/16 76.7 kg (169 lb)          Intake/Output Summary (Last 24 hours) at 12/08/16 1357  Last data filed at 12/08/16 1046   Gross per 24 hour   Intake              240 ml   Output              500 ml   Net             -260 ml       Last shift:      06/06 0701 - 06/06 1900  In: 240 [P.O.:240]  Out: 500 [Urine:500]  Last 3 shifts:         Physical Exam:    General:  Caucasian female; alert and oriented times 3 currently on RA.    HEAD: Normocephalic, without obvious abnormality, atraumatic   EYES: conjunctivae clear. PERRL,  AN Icteric sclerae   NOSE: nares normal, no drainage, no nasal flaring,    THROAT: normal; Lips, mucosa dry; No Thrush; class 3-4 airway; tongue midline   Neck: Supple, symmetrical, trachea midline,  No accessory mm use; No Stridor/ cuff leak, No goiter or thyroid tenderness   LYMPH: No abnormally enlarged lymph nodes. in neck or groin   Chest: normal   Lungs: decreased air exchange bibasilar but appears clear. Good air movement.    Heart: Regular rate and rhythm; NO edema    Abdomen: soft, non-tender, without masses or organomegaly, protuberant   GU: no Foley    Musculoskeletal: NO kyphosis; No spine or CVA tenderness; negative, cyanosis, clubbing; no joint swelling or erythema   Neuro: alert; speech fluent ;withdraws to pain; does follow simple commands   Psych: oriented to time, place and person;  No agitation;  normal affect;    Skin: Skin unremarkable;    Pulses:Bilateral, Radial, 2+   Capillary refill: normal; well perfused,    Data:     Lab results reviewed. For significant abnormal values and values requiring intervention, see assessment and plan.             Labs:    Recent Labs      12/08/16   0306  12/07/16   1538  12/06/16   1122   WBC  6.4  6.6  7.7   HGB  9.3*  9.9*  10.4*   PLT  234  256  259     Recent Labs      12/08/16   0306  12/07/16   1538  12/06/16   1122   NA  140  140  139   K  3.9  4.1  4.2   CL  104  105  105   CO2  27  29  27    GLU  103*  87  97   BUN  11  13  16    CREA  0.90  0.88  0.86   CA  8.8  9.0  9.3   ALB   --  3.7  3.9   SGOT   --   20  17   ALT   --   21  22     Recent Labs      12/07/16   1528   PH  7.43   PCO2  42   PO2  93   HCO3  27*     Recent Labs      12/06/16   1122   CPK  219*   CKNDX  1.3   TROIQ  <0.04     No results found for: BNPP, BNP   Lab Results   Component Value Date/Time    Culture result: MRSA NOT PRESENT 12/10/2015 10:30 AM    Culture result:  12/10/2015 10:30 AM         Screening of patient nares for MRSA is for surveillance purposes and, if positive, to facilitate isolation considerations in high risk settings. It is not intended for automatic decolonization interventions per se as regimens are not sufficiently effective to warrant routine use.    Culture result: MRSA NOT PRESENT 10/27/2015 09:05 AM    Culture result:  10/27/2015 09:05 AM         Screening of patient nares for MRSA is for surveillance purposes and, if positive, to facilitate isolation considerations in high risk  settings. It is not intended for automatic decolonization interventions per se as regimens are not sufficiently effective to warrant routine use.     Lab Results   Component Value Date/Time    TSH 2.080 09/15/2016 01:42 PM       Imaging:            Results from Hospital Encounter encounter on 12/06/16   XR CHEST PA LAT   Narrative EXAM:  XR CHEST PA LAT.  INDICATION: Chest pain.  COMPARISON: 05/04/2010.    FINDINGS:   PA and lateral radiographs of the chest were obtained.  Lungs: The lungs are clear of mass, nodule, airspace disease or edema.  Pleura: There is no pleural effusion or pneumothorax.  Mediastinum: The cardiac and mediastinal contours and pulmonary vascularity are  normal.  Bones and soft tissues: The bones and soft tissues are within normal limits.         Impression IMPRESSION: No acute abnormality and no change.            Results from Hospital Encounter encounter on 12/06/16   CTA CHEST W OR W WO CONT   Narrative EXAM:  CTA CHEST W OR W WO CONT    INDICATION:   Acute chest pain, upper back pain, and shortness of breath today    COMPARISON: 03/26/2016.    TECHNIQUE:   Precontrast scout images were obtained to localize the volume for acquisition.  Multislice helical CT arteriography was performed from the diaphragm to the  thoracic inlet during uneventful rapid bolus of 80 cc Isovue-370. Lung and soft  tissue windows were generated.  Coronal and sagittal images were generated and  3D post processing consisting of coronal maximum intensity images was performed.   CT dose reduction was achieved through use of a standardized protocol tailored  for this examination and automatic exposure control for dose modulation.      FINDINGS:  CHEST:  THYROID: No nodule.  MEDIASTINUM: No mass or lymphadenopathy.  HILA: No mass or lymphadenopathy.  THORACIC AORTA: No dissection or aneurysm.  MAIN PULMONARY ARTERY: No evidence of pulmonary embolism.  TRACHEA/BRONCHI: Patent.  ESOPHAGUS: No wall thickening or dilatation.  HEART: Normal in size.  PLEURA: No effusion or pneumothorax.  LUNGS: No nodule, mass, or airspace disease.  INCIDENTALLY IMAGED UPPER ABDOMEN: No focal abnormality.  BONES: Degenerative changes are seen in the thoracic spine.           Impression IMPRESSION: No evidence of pulmonary embolism or acute abnormality.          I have personally and independently reviewed the patient???s interval and diagnostic data, radiographs and have reviewed the reports.    I have ordered additional labs to follow the current medical conditions and to monitor treatment responses over the next 24 hours or sooner if needed. If the pt needs,  additional imaging will be obtained to follow longitudinal changes found on the most current imaging.      Medical Decision Making Today:  ?? Reviewed the flowsheet and previous days notes  ?? Reviewed and summarized records or history from previous days note or discussions with staff, family  ?? Parenteral controlled substances - Reviewed/ Adjusted / Weaned / Started  ?? High Risk Drug therapy requiring intensive monitoring for toxicity: eg steroids, pressors, antibiotics  ?? Reviewed and/or ordered Clinical lab tests    Constance Goltz, MD

## 2016-12-08 NOTE — Progress Notes (Signed)
RT arrived to place patient on Overnight pulse oximetry study. RT found that patient was placed back on 2L Nasal Cannula due to patient's "O2 sats going into mid 80's". Order was for study to be performed on Room Air. Discussed with RN and decision was made to hold on overnight study due to patient requiring oxygen.

## 2016-12-08 NOTE — Progress Notes (Signed)
Reason for Admission: Respiratory failure, acute                     RRAT Score:  9                   Plan for utilizing home health: N/A at this time - pt works full-time, no PT/OT needs                         Likelihood of Readmission: Low                          Transition of Care Plan: Pending evaluation, likely follow-up care appointments    Pt is a 61 yo caucasian female on observation status since 12/07/16 for respiratory failure, acute. Pt lives in a two-story house with spouse, bed/bath on 2nd floor. Pt reports she has been sleeping on the main level in the living room since her knee replacements last year, as she has had several falls (and several fractures, injuries) and difficulty with the stairs as well as getting in/out of their raised master bed. Per pt, spouse and pt are planning to convert room into master bedroom on main level soon. Pt has 4 adult kids in [place] area that provide support daily. 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 Pt has had [facility] in the past, as well as OP PT. Pt has no reported hx of SNF, acute inpatient rehab. Pt has a RW, Cane at home. Pt to dc home by private vehicle with family. Pt to transport self or use family support for follow-up care appointments. Pt's preferred Rx is [facility] ([place]).     CM met with pt to verify demographic info and complete initial assessment, dc planning. Pt is alert and oriented x 4. Pt has no PT/OT needs at this time. Pt sees Dr. Wanda Plump (PCP), Dr. Cassell Smiles (Ortho-Knee), Dr. Owens Shark (Ortho-Ankle), and Dr. Tamala Julian (Ortho-Shoulder) OP. Pt does not have a Cardiologist at this time. Pt has no PT/OT needs at this time. Pt to have Echo done and complete overnight pulse oximetry test tonight prior to discharge to determine of nocturnal O2 should be arranged. CM will continue to follow-up to ensure additional CM needs are met.      Anticipated discharge tomorrow pending Echo                   Care Management Interventions  PCP Verified by CM: Yes  Palliative Care Criteria Met (RRAT>21 & CHF Dx)?: No  Mode of Transport at Discharge: Other (see comment) (By private vehicle with family)  Transition of Care Consult (CM Consult): Discharge Planning  Discharge Durable Medical Equipment: No (RW, Cane at home)  Health Maintenance Reviewed: Yes  Physical Therapy Consult: No  Occupational Therapy Consult: No  Speech Therapy Consult: No  Current Support Network: Lives with Spouse, Own Home, Family Lives Nearby ([place] with spouse, bed/bath on 2nd level; sleeping on 1st floor; 4 children in area)  Confirm Follow Up Transport: Self  Plan discussed with Pt/Family/Caregiver: Yes  Discharge Location  Discharge Placement: Home with family assistance    Yehuda Savannah, MSW Supervisee in Social Work, [place]  [phone]

## 2016-12-08 NOTE — Progress Notes (Signed)
Respiratory Care Note: o2 sats at rest on RA 93%, o2 sats on excersion 90%

## 2016-12-08 NOTE — Progress Notes (Signed)
Hospitalist Progress Note    NAME: Lindsay [name]   DOB:  October 11, 1955   MRN:  [id number]       Assessment / Plan:  Acute respiratory failure with hypoxia  Extensive history of DVTs and PEs starting in 2014  On Xarelto until April and taken off by PCP  CTA chest negative for PE in ED yesteraday (6/4) .  Pulmonary Inputs and recommendations Appreciated.  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 Symptomatic.  monitor CBC.pending iron studies.added B12 and folic acid level.  No over melena or blood in stool.sending stool occult.  Obesity  lifestyle mods, weight loss  Body mass index is 31.36 kg/(m^2).: 30.0 - 39.9 Obese  Code status: Full  Prophylaxis: Lovenox  Recommended Disposition: TBD     Subjective:     Chief Complaint / Reason for Physician Visit  Patients sob is better now   Discussed with RN events overnight.     Review of Systems:  Symptom Y/N Comments  Symptom Y/N Comments   Fever/Chills n   Chest Pain n    Poor Appetite    Edema     Cough n   Abdominal Pain n    Sputum    Joint Pain     SOB/DOE y Better now   Pruritis/Rash     Nausea/vomit    Tolerating PT/OT     Diarrhea    Tolerating Diet     Constipation    Other       Could NOT obtain due to:      Objective:     VITALS:   Last 24hrs VS reviewed since prior progress note. Most recent are:  Patient Vitals for the past 24 hrs:   Temp Pulse Resp BP SpO2   12/08/16 0322 97.7 ??F (36.5 ??C) 61 20 106/69 95 %   12/07/16 2249 98 ??F (36.7 ??C) 72 20 101/56 -   12/07/16 1921 98.3 ??F (36.8 ??C) 82 20 118/62 97 %   12/07/16 1908 98.3 ??F (36.8 ??C) 80 20 112/60 94 %   12/07/16 1800 - 81 21 111/53 94 %   12/07/16 1700 - 97 19 113/75 95 %   12/07/16 1600 - 86 20 112/56 96 %   12/07/16 1531 - 67 19 115/79 100 %   12/07/16 1500 - 67 14 115/79 94 %   12/07/16 1456 - 66 25 - 95 %    12/07/16 1415 - - - 115/77 91 %   12/07/16 1348 98.6 ??F (37 ??C) 71 16 109/71 97 %     No intake or output data in the 24 hours ending 12/08/16 0745     PHYSICAL EXAM:  General: WD, WN. Alert, cooperative, no acute distress????  EENT:  EOMI. Anicteric sclerae. MMM  Resp:  CTA bilaterally, no wheezing or rales.  No accessory muscle use  CV:  Regular  rhythm,?? No edema  GI:  Soft, Non distended, Non tender. ??+Bowel sounds  Neurologic:?? Alert and oriented X 3, normal speech,   Psych:???? Good insight.??Not anxious nor agitated  Skin:  No rashes.  No jaundice    Reviewed most current lab test results and cultures  YES  Reviewed most current radiology test results   YES  Review and summation of old records today    NO  Reviewed  patient's current orders and MAR    YES  PMH/SH reviewed - no change compared to H&P  ________________________________________________________________________  Care Plan discussed with:    Comments   Patient y    Family      RN y    Buyer, retailCare Manager     Consultant                        Multidiciplinary team rounds were held today with case manager, nursing, pharmacist and Higher education careers adviserclinical coordinator.  Patient's plan of care was discussed; medications were reviewed and discharge planning was addressed.     ________________________________________________________________________  Total NON critical care TIME: 30  Minutes    Total CRITICAL CARE TIME Spent:   Minutes non procedure based      Comments   >50% of visit spent in counseling and coordination of care     ________________________________________________________________________  Derrick Ravelheodros H Daisia Slomski, MD     Procedures: see electronic medical records for all procedures/Xrays and details which were not copied into this note but were reviewed prior to creation of Plan.      LABS:  I reviewed today's most current labs and imaging studies.  Pertinent labs include:  Recent Labs      12/08/16   0306  12/07/16   1538  12/06/16   1122   WBC  6.4  6.6  7.7    HGB  9.3*  9.9*  10.4*   HCT  29.8*  32.1*  33.6*   PLT  234  256  259     Recent Labs      12/08/16   0306  12/07/16   1538  12/06/16   1122   NA  140  140  139   K  3.9  4.1  4.2   CL  104  105  105   CO2  27  29  27    GLU  103*  87  97   BUN  11  13  16    CREA  0.90  0.88  0.86   CA  8.8  9.0  9.3   ALB   --   3.7  3.9   TBILI   --   0.2  0.2   SGOT   --   20  17   ALT   --   21  22       Signed: Derrick Ravelheodros H Alveena Taira, MD

## 2016-12-08 NOTE — Progress Notes (Addendum)
7:28 AM Received bedside report from Ashley,W. RN    7:46 AM Called Echo lab for the pending Echo order. The nurse was told that the patient is on their list for today but as they are short staffed and a tech can only do 7 echos per day, it might be rolled over for the next day as they are routine echos and not stats. Informed them that it is the only thing holding the patient. Tech told me that she will try her best to do it today.    8:30 AM Oxygen sat 92-93% on RA. 90% on RA with exertion.    3:27 PM patient complaining about SOB> oxygen sat 86%. Put her back on 2L. Oxygen sat at 93%.

## 2016-12-09 LAB — EKG 12-LEAD
Atrial Rate: 74 {beats}/min
Diagnosis: NORMAL
P Axis: 26 degrees
P-R Interval: 150 ms
Q-T Interval: 422 ms
QRS Duration: 80 ms
QTc Calculation (Bazett): 468 ms
R Axis: 22 degrees
T Axis: 48 degrees
Ventricular Rate: 74 {beats}/min

## 2016-12-09 LAB — ECHOCARDIOGRAM COMPLETE 2D W DOPPLER W COLOR: Left Ventricular Ejection Fraction: 68

## 2016-12-09 LAB — CULTURE, URINE
Colonies Counted: 50000
Colony Count: 50000

## 2016-12-09 LAB — EKG, 12 LEAD, SUBSEQUENT
Atrial Rate: 74 {beats}/min
Calculated P Axis: 26 degrees
Calculated R Axis: 22 degrees
Calculated T Axis: 48 degrees
Diagnosis: NORMAL
P-R Interval: 150 ms
Q-T Interval: 422 ms
QRS Duration: 80 ms
QTC Calculation (Bezet): 468 ms
Ventricular Rate: 74 {beats}/min

## 2016-12-09 LAB — OCCULT BLOOD, STOOL: Occult blood, stool: NEGATIVE

## 2016-12-09 LAB — VITAMIN B12: Vitamin B12: 338 pg/mL (ref 193–986)

## 2016-12-09 LAB — TROPONIN I: Troponin-I, Qt.: <0.04 ng/mL (ref <0.05)

## 2016-12-09 LAB — FERRITIN: Ferritin: 6 NG/ML — ABNORMAL LOW (ref 8–252)

## 2016-12-09 LAB — IRON PROFILE
Iron % saturation: 6 % — ABNORMAL LOW (ref 20–50)
Iron: 25 ug/dL — ABNORMAL LOW (ref 35–150)
TIBC: 449 ug/dL (ref 250–450)

## 2016-12-09 MED ORDER — ALUM-MAG HYDROXIDE-SIMETH 200 MG-200 MG-20 MG/5 ML ORAL SUSP
200-200-20 mg/5 mL | Freq: Four times a day (QID) | ORAL | Status: DC | PRN
Start: 2016-12-09 — End: 2016-12-10
  Administered 2016-12-09: 16:00:00 via ORAL

## 2016-12-09 MED ORDER — NITROGLYCERIN 0.4 MG SUBLINGUAL TAB
0.4 mg | SUBLINGUAL | Status: AC
Start: 2016-12-09 — End: 2016-12-09
  Administered 2016-12-09: 15:00:00 via SUBLINGUAL

## 2016-12-09 MED ORDER — NITROGLYCERIN 0.4 MG SUBLINGUAL TAB
0.4 mg | Freq: Once | SUBLINGUAL | Status: AC
Start: 2016-12-09 — End: 2016-12-09

## 2016-12-09 MED ORDER — ROPINIROLE 1 MG TAB
1 mg | Freq: Every evening | ORAL | Status: DC | PRN
Start: 2016-12-09 — End: 2016-12-10
  Administered 2016-12-09 – 2016-12-10 (×2): via ORAL

## 2016-12-09 MED ORDER — FERROUS SULFATE 325 MG (65 MG ELEMENTAL IRON) TAB
325 mg (65 mg iron) | Freq: Every day | ORAL | Status: DC
Start: 2016-12-09 — End: 2016-12-10
  Administered 2016-12-10: 13:00:00 via ORAL

## 2016-12-09 MED FILL — PRAVASTATIN 40 MG TAB: 40 mg | ORAL | Qty: 1

## 2016-12-09 MED FILL — LOVENOX 40 MG/0.4 ML SUBCUTANEOUS SYRINGE: 40 mg/0.4 mL | SUBCUTANEOUS | Qty: 0.4

## 2016-12-09 MED FILL — LEXAPRO 10 MG TABLET: 10 mg | ORAL | Qty: 2

## 2016-12-09 MED FILL — MAG-AL PLUS 200 MG-200 MG-20 MG/5 ML ORAL SUSPENSION: 200-200-20 mg/5 mL | ORAL | Qty: 30

## 2016-12-09 MED FILL — NITROSTAT 0.4 MG SUBLINGUAL TABLET: 0.4 mg | SUBLINGUAL | Qty: 1

## 2016-12-09 MED FILL — PROTONIX 40 MG TABLET,DELAYED RELEASE: 40 mg | ORAL | Qty: 1

## 2016-12-09 MED FILL — CHILDREN'S ASPIRIN 81 MG CHEWABLE TABLET: 81 mg | ORAL | Qty: 1

## 2016-12-09 MED FILL — ROPINIROLE 1 MG TAB: 1 mg | ORAL | Qty: 4

## 2016-12-09 MED FILL — BD POSIFLUSH NORMAL SALINE 0.9 % INJECTION SYRINGE: INTRAMUSCULAR | Qty: 10

## 2016-12-09 MED FILL — VITAMIN D2 1,250 MCG (50,000 UNIT) CAPSULE: 1250 mcg (50,000 unit) | ORAL | Qty: 1

## 2016-12-09 MED FILL — DIAZEPAM 5 MG TAB: 5 mg | ORAL | Qty: 2

## 2016-12-09 NOTE — Progress Notes (Signed)
CM spoke with nursing - attempted overnight pulse oximetry testing last night, however pt's O2 levels dropped and placed on continuous 2LPM O2. Holding overnight oximetry testing at this time, thus pt will likely need oxygen challenge to be completed if respiratory status does not improve.     Aline AugustEmily Hall, MSW Supervisee in Social Work, [employer] ResourcesCM  [place] Care Manager  [phone]

## 2016-12-09 NOTE — Progress Notes (Signed)
O2 sats 97% on RA at rest.    O2 sats 95% on RA during ambulation.

## 2016-12-09 NOTE — Progress Notes (Addendum)
Pt c/o 5/10 epigastric burning that has moved into her chest.  No radiating, jaw pain, or increased sob.  Dr. Alvera NovelGebremeskel was notified, order received for one time troponin, ekg, nitro, and maalox.  Pt's vss.will cont to monitor pt condition.    1148 - Maalox given and relieved pt of chest burning.

## 2016-12-09 NOTE — Progress Notes (Addendum)
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  Pulmonary Inputs and recommendations Appreciated.  Echo report seen non significant finding  pulmnary suggested another  overnight oximetry to see if pt needs home oxygen. and to continue Incentive spirometer  Anticipate home tomorrow.   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  Acute microcytic anemia , ? Symptomatic.  iron studies.more of iron def anemia. Started elemental iron  FU  B12 and folic acid level.  No over melena or blood in stool.negative stool occult.  Obesity  lifestyle mods, weight loss  Body mass index is 31.36 kg/(m^2).: 30.0 - 39.9 Obese  Code status: Full  Prophylaxis: Lovenox  Recommended Disposition: TBD     Subjective:     Chief Complaint / Reason for Physician Visit  Patients sob is better now , she wants to go home  Discussed with RN events overnight.     Review of Systems:  Symptom Y/N Comments  Symptom Y/N Comments   Fever/Chills n   Chest Pain n    Poor Appetite    Edema     Cough n   Abdominal Pain n    Sputum    Joint Pain     SOB/DOE y Better now   Pruritis/Rash     Nausea/vomit    Tolerating PT/OT     Diarrhea    Tolerating Diet     Constipation    Other       Could NOT obtain due to:      Objective:     VITALS:   Last 24hrs VS reviewed since prior progress note. Most recent are:  Patient Vitals for the past 24 hrs:   Temp Pulse Resp BP SpO2   12/09/16 1153 97.9 ??F (36.6 ??C) 72 16 111/66 95 %   12/09/16 1102 - 80 - 97/53 93 %   12/09/16 1024 97.7 ??F (36.5 ??C) 71 16 109/69 98 %   12/09/16 0834 97.8 ??F (36.6 ??C) 64 18 123/68 96 %    12/09/16 0425 98.3 ??F (36.8 ??C) 63 18 100/60 98 %   12/08/16 2226 98 ??F (36.7 ??C) 77 18 120/72 96 %   12/08/16 1930 98 ??F (36.7 ??C) 84 18 131/69 95 %   12/08/16 1546 98 ??F (36.7 ??C) 75 18 109/63 95 %   12/08/16 1526 - - - - 93 %     No intake or output data in the 24 hours ending 12/09/16 1417     PHYSICAL EXAM:  General: WD, WN. Alert, cooperative, no acute distress????  EENT:  EOMI. Anicteric sclerae. MMM  Resp:  CTA bilaterally, no wheezing or rales.  No accessory muscle use  CV:  Regular  rhythm,?? No edema  GI:  Soft, Non distended, Non tender. ??+Bowel sounds  Neurologic:?? Alert and oriented X 3, normal speech,   Psych:???? Good insight.??Not anxious nor agitated  Skin:  No rashes.  No jaundice    Reviewed most current lab test results and cultures  YES  Reviewed most current radiology test results   YES  Review and summation of old records today    NO  Reviewed patient's current orders and MAR    YES  PMH/SH reviewed - no change compared to H&P  ________________________________________________________________________  Care Plan discussed with:    Comments   Patient y    Family      RN y    Buyer, retail                        Multidiciplinary team rounds were held today with case manager, nursing, pharmacist and Higher education careers adviser.  Patient's plan of care was discussed; medications were reviewed and discharge planning was addressed.     ________________________________________________________________________  Total NON critical care TIME: 30  Minutes    Total CRITICAL CARE TIME Spent:   Minutes non procedure based      Comments   >50% of visit spent in counseling and coordination of care     ________________________________________________________________________  Derrick Ravel, MD     Procedures: see electronic medical records for all procedures/Xrays and details which were not copied into this note but were reviewed prior to creation of Plan.      LABS:   I reviewed today's most current labs and imaging studies.  Pertinent labs include:  Recent Labs      12/08/16   0306  12/07/16   1538   WBC  6.4  6.6   HGB  9.3*  9.9*   HCT  29.8*  32.1*   PLT  234  256     Recent Labs      12/08/16   0306  12/07/16   1538   NA  140  140   K  3.9  4.1   CL  104  105   CO2  27  29   GLU  103*  87   BUN  11  13   CREA  0.90  0.88   CA  8.8  9.0   ALB   --   3.7   TBILI   --   0.2   SGOT   --   20   ALT   --   21       Signed: Derrick Ravel, MD

## 2016-12-09 NOTE — Progress Notes (Signed)
[facility] Pulmonary Consult Service Note  Pulmonary, Critical Care, and Sleep Medicine    Name: Lindsay [name] MRN: [id number]   DOB: 1956-06-29 Hospital: [facility]   Date: 12/09/2016   Hospital Day: 3       Subjective/Interval History:   I have reviewed the notes from other providers and old records readily available and summarized findings below with the flowsheet. Seen earlier today on rounds.  Last 24 hrs: She was noted to have some epigastric pain, had burning in her stomach and when up into her chest. She feels that she may have had some anxiety last pm.   She felt like she had mild shortness of breath. Was noted to have great oxygenation but then reported went into mid 80s but not documented. She has Gel coat on her finger nails, not sure how accurate the pox is at this point.   She appears comfortable when seen this am. NO sinus issues.     Instructed pt to get up and move around and see how her oxygen does and how she is feeling.         12-08-16: Feels that her breathing is easier, still some mild right lower chest pain. NO sinus pain or pressure.   NO Headaches. No overt chest pain or pressure.  NO GERD.   NO leg pain or swelling.  NO acute skin changes  Denies snoring, no nocturnal awakenings.     Excerpts from admission and consult notes reviewed as follows:     Hospital Problems  Date Reviewed: 2016-10-20          Codes Class Noted POA    Respiratory failure, acute (HCC) ICD-10-CM: J96.00  ICD-9-CM: 518.81  12/07/2016 Unknown              IMPRESSION:   1. Acute respiratory failure with hypoxia now improved. Pox is 91% on RA at rest. Will need to continue to assess for need for oxygen, overall she seems to be improving.   2. Atypical chest pain, now seems better as well.   3. Anemia- more than I would expect and might be contributing to sx  4. 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   5. H/o bilateral TKR  6. Echo was done and appears normal.   Body mass index is 31.36 kg/(m^2).  7. Additional workup outlined below      RECOMMENDATIONS/PLAN:   1. Supplemental O2 to keep sats > 90%.   2. Will check another  overnight oximetry to see if pt needs home oxygen.   3. Incentive spirometer  4. Anticipate home tomorrow.           My assessment/management discussed with: Consultants, Nursing, Pharmacy, Case Management, PT, OT, Respiratory Therapy, Hospitalist and Family for coordination of care    Pt's condition is acute and unstable requiring inpatient hospitalization. This care involved high complexity decision making which includes independently reviewing the patient's past medical records, current laboratory results, medication profiles that were immediately available to me and actual Xray images at the bedside in order to assess, support vital system function, and to treat this degree of vital organ system failure, and to prevent further deterioration of the patient???s condition.     Risk of deterioration: medium and high   [x]  High complexity decision making was performed  [x]  See my orders for details  Tubes:       Subjective/Initial History:     I was asked by Lindsay Jakeshristopher M Broaddus, DO to see Lindsay [name]  a 61 y.o.  Caucasian female in consultation for a chief complaint of acute respiratory failure with hypoxia    Pt is a 61yo WF life long non smoker with h/o recurrent thromboembolic disease who presented to ER for second time on two days with c/o chest and back pain associated with hypoxia, sats 81-82% on room air. Pt awoke yesterday feeling ok but devloped lightheadedness, SOb with heaviness in lower anterior ribs and in her posterior lung fields. Non pleurtic. Went to Pt first and [facility] ER. CXR and CTa chest did not show acute  problems, No PE. Pt sent home with pain meds and did ok overnight until this AM. Sats no better. Felt same and came back I was asked to see pt for Lindsay [name] in consultaion. Sats indeed were low but responded to nasal O2. No wheeze. No URI or GI sx. No palpitations of congestion. Pt reports checking her sat three weeks ago after exertions and sats 97%!! No cardiac problems.    In 2014 pt had unprovoked right lung PE. Negative workup for thrombophilia and was not on estrogen products. InApril 2017 had left TKR without incident. June 2017 had right TKR and later developed RLE DVT and bilateral PE found and [facility]. Pt started on Xarelto for three - four months. Repeat dopplers showed right LE DVT resolved but LLE DVT diagnosed. REstarted on Xarelto Sept 2017. Lindsay [name] stopped April 2018. She was without compliants at that time.     PCP: Lindsay [name]    No Known Allergies     MAR reviewed and pertinent medications noted or modified as needed     Current Facility-Administered Medications   Medication   ??? alum-mag hydroxide-simeth (MYLANTA) oral suspension 30 mL   ??? gabapentin enacarbil TbER 1 Tab  (Patient Supplied)   ??? pravastatin (PRAVACHOL) tablet 40 mg   ??? diazePAM (VALIUM) tablet 10 mg   ??? traMADol (ULTRAM) tablet 50 mg   ??? pantoprazole (PROTONIX) tablet 40 mg   ??? aspirin chewable tablet 81 mg   ??? ergocalciferol (ERGOCALCIFEROL) capsule 50,000 Units   ??? escitalopram oxalate (LEXAPRO) tablet 20 mg   ??? rOPINIRole (REQUIP) tablet 4 mg   ??? sodium chloride (NS) flush 5-10 mL   ??? sodium chloride (NS) flush 5-10 mL   ??? enoxaparin (LOVENOX) injection 40 mg      PMH:  has a past medical history of Abnormal brain scan; Arthritis; Chronic pain; Depression; DVT (deep venous thrombosis) (HCC); Falls frequently; Fracture of left ankle; ZOXWRUEA(540.9Headache(784.0) (2011); Ill-defined condition (09/15/2015); Other and unspecified hyperlipidemia (12/27/2012); PE (pulmonary embolism) (12/2012); RLS (restless legs syndrome)  (03/16/2016); TIA (transient ischemic attack) (03/2016); and Torn rotator cuff. She also has no past medical history of Complication of anesthesia.  PSH:   has a past surgical history that includes hx appendectomy; pr breast surgery procedure unlisted (2002); hx gi; hx cesarean section; hx tubal ligation; hx orthopaedic (08/2004); hx orthopaedic; hx orthopaedic (6/13); hx other surgical (2010); and hx knee replacement (Left, 10/29/2015).   FHX: family history includes Arthritis-rheumatoid in her sister; Cancer in her father; Heart Disease in her father and mother; No Known Problems in her daughter, daughter, daughter, and son. There is no history of Anesth Problems.   SHX:  reports that she has never smoked. She has never used smokeless tobacco. She  reports that she drinks about 8.4 oz of alcohol per week  She reports that she does not use illicit drugs.     ROS:A comprehensive review of systems was negative except for that written in the HPI.    Objective:     Vital Signs: Telemetry:    normal sinus rhythm Intake/Output:   Visit Vitals   ??? BP 111/66 (BP 1 Location: Right arm, BP Patient Position: At rest)   ??? Pulse 72   ??? Temp 97.9 ??F (36.6 ??C)   ??? Resp 16   ??? Ht 5\' 3"  (1.6 m)   ??? Wt 80.3 kg (177 lb 0.5 oz)   ??? SpO2 95%   ??? BMI 31.36 kg/m2       Temp (24hrs), Avg:98 ??F (36.7 ??C), Min:97.7 ??F (36.5 ??C), Max:98.3 ??F (36.8 ??C)        O2 Device: Nasal cannula O2 Flow Rate (L/min): 2 l/min       Wt Readings from Last 4 Encounters:   12/07/16 80.3 kg (177 lb 0.5 oz)   12/06/16 80.2 kg (176 lb 12.9 oz)   10/06/16 79.8 kg (176 lb)   09/15/16 76.7 kg (169 lb)        No intake or output data in the 24 hours ending 12/09/16 1243    Last shift:         Last 3 shifts: 06/05 1901 - 06/07 0700  In: 240 [P.O.:240]  Out: 500 [Urine:500]       Physical Exam:    General:  Caucasian female; alert and oriented times 3 currently on RA.    HEAD: Normocephalic, without obvious abnormality, atraumatic    EYES: conjunctivae clear. PERRL,  AN Icteric sclerae   NOSE: nares normal, no drainage, no nasal flaring,    THROAT: normal; Lips, mucosa dry; No Thrush; class 3-4 airway; tongue midline   Neck: Supple, symmetrical, trachea midline,  No accessory mm use; No Stridor/ cuff leak, No goiter or thyroid tenderness   LYMPH: No abnormally enlarged lymph nodes. in neck or groin   Chest: normal   Lungs: decreased air exchange bibasilar but appears clear. Good air movement.    Heart: Regular rate and rhythm; NO edema   Abdomen: soft, non-tender, without masses or organomegaly, protuberant   GU: no Foley    Musculoskeletal: NO kyphosis; No spine or CVA tenderness; negative, cyanosis, clubbing; no joint swelling or erythema   Neuro: alert; speech fluent ;withdraws to pain; does follow simple commands   Psych: oriented to time, place and person;  No agitation;  normal affect;    Skin: Skin unremarkable;    Pulses:Bilateral, Radial, 2+   Capillary refill: normal; well perfused,    Data:     Lab results reviewed. For significant abnormal values and values requiring intervention, see assessment and plan.             Labs:    Recent Labs      12/08/16   0306  12/07/16   1538   WBC  6.4  6.6   HGB  9.3*  9.9*   PLT  234  256     Recent Labs      12/08/16   0306  12/07/16   1538   NA  140  140   K  3.9  4.1   CL  104  105   CO2  27  29   GLU  103*  87   BUN  11  13  CREA  0.90  0.88   CA  8.8  9.0   ALB   --   3.7   SGOT   --   20   ALT   --   21     Recent Labs      12/07/16   1528   PH  7.43   PCO2  42   PO2  93   HCO3  27*     Recent Labs      12/09/16   1055   TROIQ  <0.04     No results found for: BNPP, BNP   Lab Results   Component Value Date/Time    Culture result: MIXED UROGENITAL FLORA ISOLATED 12/07/2016 06:54 PM    Culture result: MRSA NOT PRESENT 12/10/2015 10:30 AM    Culture result:  12/10/2015 10:30 AM         Screening of patient nares for MRSA is for surveillance purposes  and, if positive, to facilitate isolation considerations in high risk settings. It is not intended for automatic decolonization interventions per se as regimens are not sufficiently effective to warrant routine use.     Lab Results   Component Value Date/Time    TSH 2.080 09/15/2016 01:42 PM       Imaging:            Results from Hospital Encounter encounter on 12/06/16   XR CHEST PA LAT   Narrative EXAM:  XR CHEST PA LAT.  INDICATION: Chest pain.  COMPARISON: 05/04/2010.    FINDINGS:   PA and lateral radiographs of the chest were obtained.  Lungs: The lungs are clear of mass, nodule, airspace disease or edema.  Pleura: There is no pleural effusion or pneumothorax.  Mediastinum: The cardiac and mediastinal contours and pulmonary vascularity are  normal.  Bones and soft tissues: The bones and soft tissues are within normal limits.         Impression IMPRESSION: No acute abnormality and no change.            Results from Hospital Encounter encounter on 12/06/16   CTA CHEST W OR W WO CONT   Narrative EXAM:  CTA CHEST W OR W WO CONT    INDICATION:   Acute chest pain, upper back pain, and shortness of breath today    COMPARISON: 03/26/2016.    TECHNIQUE:   Precontrast scout images were obtained to localize the volume for acquisition.  Multislice helical CT arteriography was performed from the diaphragm to the  thoracic inlet during uneventful rapid bolus of 80 cc Isovue-370. Lung and soft  tissue windows were generated.  Coronal and sagittal images were generated and  3D post processing consisting of coronal maximum intensity images was performed.   CT dose reduction was achieved through use of a standardized protocol tailored  for this examination and automatic exposure control for dose modulation.      FINDINGS:  CHEST:  THYROID: No nodule.  MEDIASTINUM: No mass or lymphadenopathy.  HILA: No mass or lymphadenopathy.  THORACIC AORTA: No dissection or aneurysm.  MAIN PULMONARY ARTERY: No evidence of pulmonary embolism.   TRACHEA/BRONCHI: Patent.  ESOPHAGUS: No wall thickening or dilatation.  HEART: Normal in size.  PLEURA: No effusion or pneumothorax.  LUNGS: No nodule, mass, or airspace disease.  INCIDENTALLY IMAGED UPPER ABDOMEN: No focal abnormality.  BONES: Degenerative changes are seen in the thoracic spine.           Impression IMPRESSION: No evidence of pulmonary embolism  or acute abnormality.          I have personally and independently reviewed the patient???s interval and diagnostic data, radiographs and have reviewed the reports.    I have ordered additional labs to follow the current medical conditions and to monitor treatment responses over the next 24 hours or sooner if needed. If the pt needs,  additional imaging will be obtained to follow longitudinal changes found on the most current imaging.      Medical Decision Making Today:  ?? Reviewed the flowsheet and previous days notes  ?? Reviewed and summarized records or history from previous days note or discussions with staff, family  ?? Parenteral controlled substances - Reviewed/ Adjusted / Weaned / Started  ?? High Risk Drug therapy requiring intensive monitoring for toxicity: eg steroids, pressors, antibiotics  ?? Reviewed and/or ordered Clinical lab tests    Constance Goltz, MD

## 2016-12-09 NOTE — Progress Notes (Signed)
Echo done bedside

## 2016-12-10 LAB — CBC WITH AUTOMATED DIFF
ABS. BASOPHILS: 0 10*3/uL (ref 0.0–0.1)
ABS. EOSINOPHILS: 0.2 10*3/uL (ref 0.0–0.4)
ABS. IMM. GRANS.: 0 10*3/uL (ref 0.00–0.04)
ABS. LYMPHOCYTES: 1.7 10*3/uL (ref 0.8–3.5)
ABS. MONOCYTES: 0.4 10*3/uL (ref 0.0–1.0)
ABS. NEUTROPHILS: 3.9 10*3/uL (ref 1.8–8.0)
ABSOLUTE NRBC: 0 10*3/uL (ref 0.00–0.01)
BASOPHILS: 1 % (ref 0–1)
EOSINOPHILS: 4 % (ref 0–7)
HCT: 29.4 % — ABNORMAL LOW (ref 35.0–47.0)
HGB: 9.1 g/dL — ABNORMAL LOW (ref 11.5–16.0)
IMMATURE GRANULOCYTES: 0 % (ref 0.0–0.5)
LYMPHOCYTES: 27 % (ref 12–49)
MCH: 23.9 PG — ABNORMAL LOW (ref 26.0–34.0)
MCHC: 31 g/dL (ref 30.0–36.5)
MCV: 77.4 FL — ABNORMAL LOW (ref 80.0–99.0)
MONOCYTES: 7 % (ref 5–13)
MPV: 9.2 FL (ref 8.9–12.9)
NEUTROPHILS: 62 % (ref 32–75)
NRBC: 0 PER 100 WBC
PLATELET: 219 10*3/uL (ref 150–400)
RBC: 3.8 M/uL (ref 3.80–5.20)
RDW: 14.6 % — ABNORMAL HIGH (ref 11.5–14.5)
WBC: 6.3 10*3/uL (ref 3.6–11.0)

## 2016-12-10 LAB — METABOLIC PANEL, COMPREHENSIVE
A-G Ratio: 0.9 — ABNORMAL LOW (ref 1.1–2.2)
ALT (SGPT): 21 U/L (ref 12–78)
AST (SGOT): 17 U/L (ref 15–37)
Albumin: 3.3 g/dL — ABNORMAL LOW (ref 3.5–5.0)
Alk. phosphatase: 132 U/L — ABNORMAL HIGH (ref 45–117)
Anion gap: 8 mmol/L (ref 5–15)
BUN/Creatinine ratio: 17 (ref 12–20)
BUN: 14 MG/DL (ref 6–20)
Bilirubin, total: 0.3 MG/DL (ref 0.2–1.0)
CO2: 27 mmol/L (ref 21–32)
Calcium: 9.1 MG/DL (ref 8.5–10.1)
Chloride: 104 mmol/L (ref 97–108)
Creatinine: 0.81 MG/DL (ref 0.55–1.02)
GFR est AA: >60 mL/min/{1.73_m2} (ref >60)
GFR est non-AA: >60 mL/min/{1.73_m2} (ref >60)
Globulin: 3.5 g/dL (ref 2.0–4.0)
Glucose: 105 mg/dL — ABNORMAL HIGH (ref 65–100)
Potassium: 3.8 mmol/L (ref 3.5–5.1)
Protein, total: 6.8 g/dL (ref 6.4–8.2)
Sodium: 139 mmol/L (ref 136–145)

## 2016-12-10 LAB — RBC, FOLATE
Folate, Hemolysate: 247.6 ng/mL
Folate, RBC: 894 ng/mL (ref >498)
HCT: 27.7 % — ABNORMAL LOW (ref 34.0–46.6)

## 2016-12-10 MED ORDER — FERROUS SULFATE 325 MG (65 MG ELEMENTAL IRON) TAB
325 mg (65 mg iron) | ORAL_TABLET | Freq: Every day | ORAL | 0 refills | Status: DC
Start: 2016-12-10 — End: 2017-05-23

## 2016-12-10 MED FILL — FERROUS SULFATE 325 MG (65 MG ELEMENTAL IRON) TAB: 325 mg (65 mg iron) | ORAL | Qty: 1

## 2016-12-10 MED FILL — PRAVASTATIN 40 MG TAB: 40 mg | ORAL | Qty: 1

## 2016-12-10 MED FILL — BD POSIFLUSH NORMAL SALINE 0.9 % INJECTION SYRINGE: INTRAMUSCULAR | Qty: 10

## 2016-12-10 MED FILL — LEXAPRO 10 MG TABLET: 10 mg | ORAL | Qty: 2

## 2016-12-10 MED FILL — ROPINIROLE 1 MG TAB: 1 mg | ORAL | Qty: 4

## 2016-12-10 MED FILL — LOVENOX 40 MG/0.4 ML SUBCUTANEOUS SYRINGE: 40 mg/0.4 mL | SUBCUTANEOUS | Qty: 0.4

## 2016-12-10 MED FILL — PROTONIX 40 MG TABLET,DELAYED RELEASE: 40 mg | ORAL | Qty: 1

## 2016-12-10 MED FILL — DIAZEPAM 5 MG TAB: 5 mg | ORAL | Qty: 2

## 2016-12-10 MED FILL — CHILDREN'S ASPIRIN 81 MG CHEWABLE TABLET: 81 mg | ORAL | Qty: 1

## 2016-12-10 NOTE — Discharge Summary (Signed)
Discharge Summary by Lindsay [name], Lindsay Delmundo A., MD at 12/10/16 1503                Author: Hinda [name], Lindsay Menter A., MD  Service: Internal Medicine  Author Type: Physician       Filed: 12/10/16 1522  Date of Service: 12/10/16 1503  Status: Addendum          Editor: Lindsay [name], Lindsay Lama A., MD (Physician)          Related Notes: Original Note by Lindsay [name], Lindsay Guertin A., MD (Physician) filed at 12/10/16 1521                                       Hospitalist Discharge Summary        Patient ID:   Lindsay [name]   [id number]   61 y.o.   09/23/1955      PCP on record: Lindsay CritchleyFrancesca L Glynn, MD      Admit date: 12/07/2016   Discharge date and time: 12/10/2016         DISCHARGE DIAGNOSIS:      Acute respiratory failure with hypoxia.resolved   microcytic anemia    Obesity   Body mass index is 31.36 kg/(m^2).: 30.0 - 39.9 Obese   Code status: Full            CONSULTATIONS:   IP CONSULT TO HOSPITALIST   IP CONSULT TO INTENSIVIST      Excerpted HPI from H&P of Lindsay M Broaddus, DO:   CHIEF COMPLAINT: Shortness of breath, hypoxia   ??   HISTORY OF PRESENT ILLNESS:      Lindsay [name] is a 61 y.o.  Caucasian, female, seen in the ED here yesterday for shortness of breath. She also admits some chest discomfort and also lumbar back pain when attempting deep breaths. She was seen in an acute care facility and then [facility]  [facility] ED yesterday with unremarkable CXR and CTA chest which was negative for PE. Pt was discharged home but reports no improvement of symptoms last night or this AM and notes home pulse ox readings in the low 80s this AM. She has no history  of chronic resp failure and is not on home O2, has never been a smoker. Of note, She does have a significant history of multiple PEs and DVTs, dating back to 2014, at which she underwent B/L total knee replacements in short sequence (about 7 weeks apart).  She reports that she initially underwent the Lt knee surgery without issues but developed RLE DVT and then B/L PE. Pt was started on  Xarelto. Later she had repeat LE dopplers which showed resolution of the RLE DVT but new LLE DVT was diagnosed. Around  this time the patient underwent a thrombophilia work-up at [facility] which was unremarkable by her report.    ??   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 At that time patient underwent B/L total knee replacements, first on the Lt knee and subsequently on the left, in a short sequence over about 7 weeks.    ??   Xarelto was recently discontinued by her PCP (10/2016).   ??   We were asked to admit for work up and evaluation of the above problems.    ??      ______________________________________________________________________   DISCHARGE SUMMARY/HOSPITAL COURSE:   for full details see H&P,  daily progress notes, labs, consult notes.          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   Pulmonary Inputs and recommendations Appreciated.   Echo report seen non significant finding   pulmnary suggested another ??overnight oximetry to see if pt needs home oxygen. and to continue Incentive spirometer   Anticipate home tomorrow.    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      Per pulmonary       1.  No need for Supplemental O2.    2.  No clinically significant desaturations while sleeping.    3.  Agree with discharge home   4.  Can follow up with me in office as needed.     5.     Acute microcytic anemia , ? Symptomatic.   iron studies.more of iron def anemia. Started elemental iron   FU  B12 and folic acid level.   No over melena or blood in stool.negative stool occult.   Will need close outpt f/u- if colonoscopy  Not done in past will  need one.   Pt aware that she will repeat labs  By pcp.   pt may need hematology referral       Obesity   lifestyle mods, weight loss   Body mass index is 31.36 kg/(m^2).: 30.0 - 39.9 Obese   Code status: Full   Prophylaxis: Lovenox   Recommended Disposition: TBD         _______________________________________________________________________   Patient seen and examined by me on discharge day.   Pertinent Findings:   Gen:    Not in distress   Chest: Clear lungs   CVS:   Regular rhythm.  No edema   Abd:  Soft, not distended, not tender   Neuro:  Alert, Ox3   _______________________________________________________________________   DISCHARGE MEDICATIONS:      Current Discharge Medication List              START taking these medications          Details        ferrous sulfate 325 mg (65 mg iron) tablet  Take 1 Tab by mouth daily (with breakfast).   Qty: 10 Tab, Refills:  0                     CONTINUE these medications which have NOT CHANGED          Details        !! diazePAM (VALIUM) 5 mg tablet  Take 5 mg by mouth daily. 5 mg every morning and 10 mg QHS               traMADol (ULTRAM) 50 mg tablet  Take 1 Tab by mouth every six (6) hours as needed for Pain. Max Daily Amount: 200 mg.   Qty: 20 Tab, Refills:  0          Associated Diagnoses: Acute chest pain               rOPINIRole (REQUIP) 1 mg  tablet  Take 4 tabs 1-3 hours before bedtime.   Qty: 120 Tab, Refills:  2          Comments: Needs f/u appointment in clinic for any additional refills.               pravastatin (PRAVACHOL) 40 mg tablet  Take 1 Tab by mouth nightly.   Qty: 90 Tab, Refills:  0          Associated Diagnoses: Dyslipidemia, goal LDL below 100               !! diazePAM (VALIUM) 5 mg tablet  Take 10 mg by mouth nightly. 5 mg every morning and 10 mg QHS   Refills: 5               gabapentin enacarbil (HORIZANT) 600 mg TbER  Take 1 Tab by mouth every evening.   Qty: 30 Tab, Refills:  11               aspirin 81 mg chewable tablet  Take 1 Tab by mouth  daily.   Qty: 30 Tab, Refills:  0               ergocalciferol (ERGOCALCIFEROL) 50,000 unit capsule  Take 1 Cap by mouth every seven (7) days.   Qty: 12 Cap, Refills:  1          Associated Diagnoses: Vitamin deficiency               escitalopram oxalate (LEXAPRO) 20 mg tablet  Take 1 Tab by mouth daily.   Qty: 30 Tab, Refills:  5               omeprazole (PRILOSEC) 20 mg capsule  Take 20 mg by mouth nightly.               !! - Potential duplicate medications found. Please discuss with provider.                  My Recommended Diet, Activity, Wound Care, and follow-up labs are listed in the patient's Discharge Insturctions which I have personally completed and reviewed.      ______________________________________________________________________      Risk of deterioration: Moderate       Condition at Discharge:  Stable   ______________________________________________________________________      Disposition   HOME   ______________________________________________________________________      Care Plan discussed with:    CM,RN,PATIENT      ______________________________________________________________________      Code Status: FULL CODE   ______________________________________________________________________         Follow up with:    PCP : Lindsay Critchley, MD     Follow-up Information        Follow up With  Details  Comments  Contact Info             Lindsay Critchley, MD  Go on 12/14/2016  For scheduled appointment at 1:55PM   [address]   [address]   [place] [address]   [phone]                Lindsay Critchley, MD  Go on 12/24/2016  For hospital follow up appointment at 11:15AM   [address]   [address]   [place] [address]   [phone]                Constance Goltz, MD  Call  in 2 weeks  As needed, If symptoms worsen  [address] [facility] [address]   Pulmonary [place] [address]   [phone]                         Total time in minutes spent coordinating this  discharge (includes going over instructions, follow-up, prescriptions, and preparing report for sign off to her PCP) :  30  minutes      Signed:   Hinda Lenis, MD

## 2016-12-10 NOTE — Progress Notes (Signed)
Report received from Ashley, RN. SBAR, Kardex, ED Summary, MAR and Recent Results was discussed.    Lauren Bruce, RN

## 2016-12-10 NOTE — Progress Notes (Addendum)
Pt to discharge home today by private vehicle with family. Pt to transport self or use family support for follow-up care appointments. 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 PCP f/u appointment scheduled. Pt advised to schedule Pulmonology f/u as needed if symptoms worsen. All information entered into pt AVS.     Pt has no additional CM needs at this time. Floor nurse notified.     Care Management Interventions  PCP Verified by CM: Yes  Palliative Care Criteria Met (RRAT>21 & CHF Dx)?: No  Mode of Transport at Discharge: Other (see comment) (By private vehicle with family)  Hospital Transport Time of Discharge: [place] (CM Consult): Discharge Planning  Discharge Durable Medical Equipment: No ([place] at home)  Health Maintenance Reviewed: Yes  Physical Therapy Consult: No  Occupational Therapy Consult: No  Speech Therapy Consult: No  Current Support Network: Lives with Spouse, Own Home, Family [employer] ([place] with spouse, bed/bath on 2nd level; sleeping on 1st floor; 4 children in area)  Confirm Follow Up Transport: Self  Plan discussed with Pt/Family/Caregiver: Yes  Discharge Location  Discharge Placement: Home with family assistance    Yehuda Savannah, MSW Supervisee in Social Work, [place]  [phone]

## 2016-12-10 NOTE — Progress Notes (Signed)
Pharmacist Discharge Medication Reconciliation    Significant PMH:   Past Medical History:   Diagnosis Date   ??? Abnormal brain scan    ??? Arthritis    ??? Chronic pain    ??? Depression    ??? DVT (deep venous thrombosis) (HCC)    ??? Falls frequently    ??? Fracture of left ankle    ??? Headache(784.0) 2011   ??? Ill-defined condition 09/15/2015    FX R HAND   ??? Other and unspecified hyperlipidemia 12/27/2012   ??? PE (pulmonary embolism) 12/2012   ??? RLS (restless legs syndrome) 03/16/2016    Dr Scarlett Presto   ??? TIA (transient ischemic attack) 03/2016    Right facial droop, numbness in lip   ??? Torn rotator cuff     right     Chief Complaint for this Admission:   Chief Complaint   Patient presents with   ??? Chest Pain     pt reports she was seen yesterday for chest pain and shortness of breath, reports her oxygen sats were low during the night, continue with symptoms     Allergies: Review of patient's allergies indicates no known allergies.    Discharge Medications:   Current Discharge Medication List        START taking these medications    Details   ferrous sulfate 325 mg (65 mg iron) tablet Take 1 Tab by mouth daily (with breakfast).  Qty: 10 Tab, Refills: 0           CONTINUE these medications which have NOT CHANGED    Details   !! diazePAM (VALIUM) 5 mg tablet Take 5 mg by mouth daily. 5 mg every morning and 10 mg QHS      traMADol (ULTRAM) 50 mg tablet Take 1 Tab by mouth every six (6) hours as needed for Pain. Max Daily Amount: 200 mg.  Qty: 20 Tab, Refills: 0    Associated Diagnoses: Acute chest pain      rOPINIRole (REQUIP) 1 mg tablet Take 4 tabs 1-3 hours before bedtime.  Qty: 120 Tab, Refills: 2    Comments: Needs f/u appointment in clinic for any additional refills.      pravastatin (PRAVACHOL) 40 mg tablet Take 1 Tab by mouth nightly.  Qty: 90 Tab, Refills: 0    Associated Diagnoses: Dyslipidemia, goal LDL below 100      !! diazePAM (VALIUM) 5 mg tablet Take 10 mg by mouth nightly. 5 mg every morning and 10 mg QHS   Refills: 5      gabapentin enacarbil (HORIZANT) 600 mg TbER Take 1 Tab by mouth every evening.  Qty: 30 Tab, Refills: 11      aspirin 81 mg chewable tablet Take 1 Tab by mouth daily.  Qty: 30 Tab, Refills: 0      ergocalciferol (ERGOCALCIFEROL) 50,000 unit capsule Take 1 Cap by mouth every seven (7) days.  Qty: 12 Cap, Refills: 1    Associated Diagnoses: Vitamin deficiency      escitalopram oxalate (LEXAPRO) 20 mg tablet Take 1 Tab by mouth daily.  Qty: 30 Tab, Refills: 5      omeprazole (PRILOSEC) 20 mg capsule Take 20 mg by mouth nightly.       !! - Potential duplicate medications found. Please discuss with provider.          The patient's chart, MAR and AVS were reviewed by Sheryn Bison, PHARMD.    Discharging Provider: Dr. Ginnie Smart  Thank You,     Sheryn BisonAmy C Cook, PHARMD

## 2016-12-10 NOTE — Other (Signed)
PCP TOC appt scheduled with Dr. Darl PikesGlynn on 12/24/16 at 11:15AM. Appt added to AVS. Rogelia BogaMichael Pierce, CM Specialist

## 2016-12-10 NOTE — Progress Notes (Signed)
DISCHARGE SUMMARY from Nurse    Patient stable for discharge.  I have reviewed discharge instructions with the patient. The patient verbalized understanding.  All questions fully answered.  Prescriptions and medication handouts given to patient.  Telemonitor and PIV removed. No personal belongings, home medications and valuables left at patient???s bedside/server/safe. patient verbalized no complaints.

## 2016-12-10 NOTE — Progress Notes (Signed)
[facility] Consult Service Note  Pulmonary, Critical Care, and Sleep Medicine    Name: Lindsay [name] MRN: [id number]   DOB: [birth year] Hospital: [facility]   Date: 12/10/2016   Hospital Day: 4       Subjective/Interval History:   Last 24 hrs: No chest pain, no back pain, no abdominal pain. NO leg pain. Was able to get up and walk without issues. She slept ok last night. She has  Not other acute complaints on ROS.     Hospital Problems  Date Reviewed: October 16, 2016          Codes Class Noted POA    Respiratory failure, acute (HCC) ICD-10-CM: J96.00  ICD-9-CM: 518.81  12/07/2016 Unknown              IMPRESSION:   1. No evidence of hypoxia on overnight, no resting or exertional hypoxia.   2. Atypical chest pain, now seems better as well.   3. Anemia- more than I would expect and might be contributing to sx  4. 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   5. H/o bilateral TKR  6. Echo was done and appears normal.   Body mass index is 31.36 kg/(m^2).      RECOMMENDATIONS/PLAN:   1. No need for Supplemental O2.   2. No clinically significant desaturations while sleeping.   3. Agree with discharge home  4. Can follow up with me in office as needed.              Subjective/Initial History:     I was asked by [name], DO to see Lindsay [name]  a 61 y.o.  Caucasian female in consultation for a chief complaint of acute respiratory failure with hypoxia    Pt is a 61yo WF life long non smoker with h/o recurrent thromboembolic disease who presented to ER for second time on two days with c/o chest and back pain associated with hypoxia, sats 81-82% on room air. Pt awoke yesterday feeling ok but devloped lightheadedness, SOb with heaviness in lower anterior ribs and in her posterior lung fields. Non pleurtic. [facility]  [facility] ER. CXR and CTa chest did not show acute problems, No PE. Pt sent home with pain meds and did ok overnight until this AM. Sats no better. Felt same and came back I was asked to see pt for Dr. Hetty Ely in consultaion. Sats indeed were low but responded to nasal O2. No wheeze. No URI or GI sx. No palpitations of congestion. Pt reports checking her sat three weeks ago after exertions and sats 97%!! No cardiac problems.    In 2014 pt had unprovoked right lung PE. Negative workup for thrombophilia and was not on estrogen products. InApril 2017 had left TKR without incident. June 2017 had right TKR and later developed RLE DVT and bilateral PE found and treated at [facility]. Pt started on Xarelto for three - four months. Repeat dopplers showed right LE DVT resolved but LLE DVT diagnosed. REstarted on Xarelto Sept 2017. [name] stopped April 2018. She was without compliants at that time.     PCP: Dr.Francesca Darl Pikes    No Known Allergies     MAR reviewed and pertinent medications noted or modified as needed     Current Facility-Administered Medications   Medication   ??? alum-mag hydroxide-simeth (  MYLANTA) oral suspension 30 mL   ??? ferrous sulfate tablet 325 mg   ??? gabapentin enacarbil TbER 1 Tab  (Patient Supplied)   ??? pravastatin (PRAVACHOL) tablet 40 mg   ??? diazePAM (VALIUM) tablet 10 mg   ??? traMADol (ULTRAM) tablet 50 mg   ??? pantoprazole (PROTONIX) tablet 40 mg   ??? aspirin chewable tablet 81 mg   ??? ergocalciferol (ERGOCALCIFEROL) capsule 50,000 Units   ??? escitalopram oxalate (LEXAPRO) tablet 20 mg   ??? rOPINIRole (REQUIP) tablet 4 mg   ??? sodium chloride (NS) flush 5-10 mL   ??? sodium chloride (NS) flush 5-10 mL   ??? enoxaparin (LOVENOX) injection 40 mg      PMH:  has a past medical history of Abnormal brain scan; Arthritis; Chronic pain; Depression; DVT (deep venous thrombosis) (HCC); Falls frequently; Fracture of left ankle; ZOXWRUEA(540.9) (2011); Ill-defined  condition (09/15/2015); Other and unspecified hyperlipidemia (12/27/2012); PE (pulmonary embolism) (12/2012); RLS (restless legs syndrome) (03/16/2016); TIA (transient ischemic attack) (03/2016); and Torn rotator cuff. She also has no past medical history of Complication of anesthesia.  PSH:   has a past surgical history that includes hx appendectomy; pr breast surgery procedure unlisted (2002); hx gi; hx cesarean section; hx tubal ligation; hx orthopaedic (08/2004); hx orthopaedic; hx orthopaedic (6/13); hx other surgical (2010); and hx knee replacement (Left, 10/29/2015).   FHX: family history includes Arthritis-rheumatoid in her sister; Cancer in her father; Heart Disease in her father and mother; No Known Problems in her daughter, daughter, daughter, and son. There is no history of Anesth Problems.   SHX:  reports that she has never smoked. She has never used smokeless tobacco. She reports that she drinks about 8.4 oz of alcohol per week  She reports that she does not use illicit drugs.     ROS:A comprehensive review of systems was negative except for that written in the HPI.    Objective:     Vital Signs: Telemetry:    normal sinus rhythm Intake/Output:   Visit Vitals   ??? BP 109/60 (BP 1 Location: Right arm, BP Patient Position: At rest)   ??? Pulse 73   ??? Temp 98.1 ??F (36.7 ??C)   ??? Resp 18   ??? Ht 5\' 3"  (1.6 m)   ??? Wt 80.3 kg (177 lb 0.5 oz)   ??? SpO2 98%   ??? BMI 31.36 kg/m2       Temp (24hrs), Avg:97.9 ??F (36.6 ??C), Min:97.7 ??F (36.5 ??C), Max:98.2 ??F (36.8 ??C)        O2 Device: Room air O2 Flow Rate (L/min): 2 l/min       Wt Readings from Last 4 Encounters:   12/07/16 80.3 kg (177 lb 0.5 oz)   12/06/16 80.2 kg (176 lb 12.9 oz)   10/06/16 79.8 kg (176 lb)   09/15/16 76.7 kg (169 lb)          Intake/Output Summary (Last 24 hours) at 12/10/16 0732  Last data filed at 12/09/16 1828   Gross per 24 hour   Intake              240 ml   Output                0 ml   Net              240 ml       Last shift:          Last 3 shifts: 06/06 1901 - 06/08  0700  In: 240 [P.O.:240]  Out: -        Physical Exam:    General:  Caucasian female; alert and oriented times 3 currently on RA.    HEAD: Normocephalic, without obvious abnormality, atraumatic   EYES: conjunctivae clear. PERRL,  AN Icteric sclerae   NOSE: nares normal, no drainage, no nasal flaring,    THROAT: normal; Lips, mucosa dry; No Thrush; class 3-4 airway; tongue midline   Neck: Supple, symmetrical, trachea midline,  No accessory mm use; No Stridor/ cuff leak, No goiter or thyroid tenderness   LYMPH: No abnormally enlarged lymph nodes. in neck or groin   Chest: normal   Lungs: decreased air exchange bibasilar but appears clear. Good air movement.    Heart: Regular rate and rhythm; NO edema   Abdomen: soft, non-tender, without masses or organomegaly, protuberant   GU: no Foley    Musculoskeletal: NO kyphosis; No spine or CVA tenderness; negative, cyanosis, clubbing; no joint swelling or erythema   Neuro: Nonfocal. Motor is intact.    Psych: oriented to time, place and person;  No agitation;  normal affect;    Skin: Skin unremarkable;    Pulses:Bilateral, Radial, 2+   Capillary refill: normal; well perfused,    Data:     Lab results reviewed. For significant abnormal values and values requiring intervention, see assessment and plan.             Labs:    Recent Labs      12/10/16   0321  12/08/16   0306  12/07/16   1538   WBC  6.3  6.4  6.6   HGB  9.1*  9.3*  9.9*   PLT  219  234  256     Recent Labs      12/10/16   0321  12/08/16   0306  12/07/16   1538   NA  139  140  140   K  3.8  3.9  4.1   CL  104  104  105   CO2  27  27  29    GLU  105*  103*  87   BUN  14  11  13    CREA  0.81  0.90  0.88   CA  9.1  8.8  9.0   ALB  3.3*   --   3.7   SGOT  17   --   20   ALT  21   --   21     Recent Labs      12/07/16   1528   PH  7.43   PCO2  42   PO2  93   HCO3  27*     Recent Labs      12/09/16   1055   TROIQ  <0.04     No results found for: BNPP, BNP   Lab Results    Component Value Date/Time    Culture result: MIXED UROGENITAL FLORA ISOLATED 12/07/2016 06:54 PM    Culture result: MRSA NOT PRESENT 12/10/2015 10:30 AM    Culture result:  12/10/2015 10:30 AM         Screening of patient nares for MRSA is for surveillance purposes and, if positive, to facilitate isolation considerations in high risk settings. It is not intended for automatic decolonization interventions per se as regimens are not sufficiently effective to warrant routine use.     Lab Results   Component Value Date/Time    TSH  2.080 09/15/2016 01:42 PM       Imaging:            Results from Hospital Encounter encounter on 12/06/16   XR CHEST PA LAT   Narrative EXAM:  XR CHEST PA LAT.  INDICATION: Chest pain.  COMPARISON: 05/04/2010.    FINDINGS:   PA and lateral radiographs of the chest were obtained.  Lungs: The lungs are clear of mass, nodule, airspace disease or edema.  Pleura: There is no pleural effusion or pneumothorax.  Mediastinum: The cardiac and mediastinal contours and pulmonary vascularity are  normal.  Bones and soft tissues: The bones and soft tissues are within normal limits.         Impression IMPRESSION: No acute abnormality and no change.            Results from Hospital Encounter encounter on 12/06/16   CTA CHEST W OR W WO CONT   Narrative EXAM:  CTA CHEST W OR W WO CONT    INDICATION:   Acute chest pain, upper back pain, and shortness of breath today    COMPARISON: 03/26/2016.    TECHNIQUE:   Precontrast scout images were obtained to localize the volume for acquisition.  Multislice helical CT arteriography was performed from the diaphragm to the  thoracic inlet during uneventful rapid bolus of 80 cc Isovue-370. Lung and soft  tissue windows were generated.  Coronal and sagittal images were generated and  3D post processing consisting of coronal maximum intensity images was performed.   CT dose reduction was achieved through use of a standardized protocol tailored   for this examination and automatic exposure control for dose modulation.      FINDINGS:  CHEST:  THYROID: No nodule.  MEDIASTINUM: No mass or lymphadenopathy.  HILA: No mass or lymphadenopathy.  THORACIC AORTA: No dissection or aneurysm.  MAIN PULMONARY ARTERY: No evidence of pulmonary embolism.  TRACHEA/BRONCHI: Patent.  ESOPHAGUS: No wall thickening or dilatation.  HEART: Normal in size.  PLEURA: No effusion or pneumothorax.  LUNGS: No nodule, mass, or airspace disease.  INCIDENTALLY IMAGED UPPER ABDOMEN: No focal abnormality.  BONES: Degenerative changes are seen in the thoracic spine.           Impression IMPRESSION: No evidence of pulmonary embolism or acute abnormality.          I have personally and independently reviewed the patient???s interval and diagnostic data, radiographs and have reviewed the reports.    I have ordered additional labs to follow the current medical conditions and to monitor treatment responses over the next 24 hours or sooner if needed. If the pt needs,  additional imaging will be obtained to follow longitudinal changes found on the most current imaging.      Constance Goltzrew G Tiron Suski, MD

## 2016-12-10 NOTE — Discharge Summary (Addendum)
Hospitalist Discharge Summary     Patient ID:  Lindsay [name]  [id number]  61 y.o.  September 10, 1955    PCP on record: Lindsay Critchley, MD    Admit date: 12/07/2016  Discharge date and time: 12/10/2016      DISCHARGE DIAGNOSIS:    Acute respiratory failure with hypoxia.resolved  microcytic anemia   Obesity  Body mass index is 31.36 kg/(m^2).: 30.0 - 39.9 Obese  Code status: Full        CONSULTATIONS:  IP CONSULT TO HOSPITALIST  IP CONSULT TO INTENSIVIST    Excerpted HPI from H&P of Christopher M Broaddus, DO:  CHIEF COMPLAINT: Shortness of breath, hypoxia  ??  HISTORY OF PRESENT ILLNESS:     Haden Cavenaugh is a 61 y.o.  Caucasian, female, seen in the ED here yesterday for shortness of breath. She also admits some chest discomfort and also lumbar back pain when attempting deep breaths. She was seen in an acute care facility and then here at [facility] in the ED yesterday with unremarkable CXR and CTA chest which was negative for PE. Pt was discharged home but reports no improvement of symptoms last night or this AM and notes home pulse ox readings in the low 80s this AM. She has no history of chronic resp failure and is not on home O2, has never been a smoker. Of note, She does have a significant history of multiple PEs and DVTs, dating back to 2014, at which she underwent B/L total knee replacements in short sequence (about 7 weeks apart). She reports that she initially underwent the Lt knee surgery without issues but developed RLE DVT and then B/L PE. Pt was started on Xarelto. Later she had repeat LE dopplers which showed resolution of the RLE DVT but new LLE DVT was diagnosed. Around this time the patient underwent a thrombophilia work-up at [facility] which was unremarkable by her report.   ??  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 At that time patient underwent B/L total knee replacements, first on the Lt knee and subsequently on the left, in a  short sequence over about 7 weeks.   ??  Xarelto was recently discontinued by her PCP (10/2016).  ??  We were asked to admit for work up and evaluation of the above problems.   ??    ______________________________________________________________________  DISCHARGE SUMMARY/HOSPITAL COURSE:  for full details see H&P, daily progress notes, labs, consult notes.       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  Pulmonary Inputs and recommendations Appreciated.  Echo report seen non significant finding  pulmnary suggested another ??overnight oximetry to see if pt needs home oxygen. and to continue Incentive spirometer  Anticipate home tomorrow.   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    Per pulmonary     1. No need for Supplemental O2.   2. No clinically significant desaturations while sleeping.   3. Agree with discharge home  4. Can follow up with me in office as needed.    5.   Acute  microcytic anemia , ? Symptomatic.  iron studies.more of iron def anemia. Started elemental iron  FU  B12 and folic acid level.  No over melena or blood in stool.negative stool occult.  Will need close outpt f/u- if colonoscopy  Not done in past will need one.  Pt aware that she will repeat labs  By pcp.  pt may need hematology referral     Obesity  lifestyle mods, weight loss  Body mass index is 31.36 kg/(m^2).: 30.0 - 39.9 Obese  Code status: Full  Prophylaxis: Lovenox  Recommended Disposition: TBD      _______________________________________________________________________  Patient seen and examined by me on discharge day.  Pertinent Findings:  Gen:    Not in distress  Chest: Clear lungs   CVS:   Regular rhythm.  No edema  Abd:  Soft, not distended, not tender  Neuro:  Alert, Ox3  _______________________________________________________________________  DISCHARGE MEDICATIONS:   Current Discharge Medication List      START taking these medications    Details   ferrous sulfate 325 mg (65 mg iron) tablet Take 1 Tab by mouth daily (with breakfast).  Qty: 10 Tab, Refills: 0         CONTINUE these medications which have NOT CHANGED    Details   !! diazePAM (VALIUM) 5 mg tablet Take 5 mg by mouth daily. 5 mg every morning and 10 mg QHS      traMADol (ULTRAM) 50 mg tablet Take 1 Tab by mouth every six (6) hours as needed for Pain. Max Daily Amount: 200 mg.  Qty: 20 Tab, Refills: 0    Associated Diagnoses: Acute chest pain      rOPINIRole (REQUIP) 1 mg tablet Take 4 tabs 1-3 hours before bedtime.  Qty: 120 Tab, Refills: 2    Comments: Needs f/u appointment in clinic for any additional refills.      pravastatin (PRAVACHOL) 40 mg tablet Take 1 Tab by mouth nightly.  Qty: 90 Tab, Refills: 0    Associated Diagnoses: Dyslipidemia, goal LDL below 100      !! diazePAM (VALIUM) 5 mg tablet Take 10 mg by mouth nightly. 5 mg every morning and 10 mg QHS  Refills: 5      gabapentin enacarbil (HORIZANT) 600 mg TbER Take 1 Tab by mouth every evening.  Qty: 30 Tab, Refills: 11      aspirin 81 mg chewable tablet Take 1 Tab by mouth daily.  Qty: 30 Tab, Refills: 0      ergocalciferol (ERGOCALCIFEROL) 50,000 unit capsule Take 1 Cap by mouth every seven (7) days.  Qty: 12 Cap, Refills: 1    Associated Diagnoses: Vitamin deficiency      escitalopram oxalate (LEXAPRO) 20 mg tablet Take 1 Tab by mouth daily.  Qty: 30 Tab, Refills: 5      omeprazole (PRILOSEC) 20 mg capsule Take 20 mg by mouth nightly.       !! - Potential duplicate medications found. Please discuss with provider.          My Recommended Diet, Activity, Wound Care, and follow-up labs are listed in the patient's Discharge Insturctions which I have personally completed  and reviewed.    ______________________________________________________________________    Risk of deterioration: Moderate     Condition at Discharge:  Stable  ______________________________________________________________________    Disposition  HOME  ______________________________________________________________________    Care Plan discussed with:   CM,RN,PATIENT    ______________________________________________________________________    Code Status: FULL CODE  ______________________________________________________________________  Follow up with:   PCP : Lindsay CritchleyFrancesca L Glynn, MD  Follow-up Information     Follow up With Details Comments Contact Info    Lindsay CritchleyFrancesca L Glynn, MD Go on 12/14/2016 For scheduled appointment at 1:55PM  [address]  [address]  [place] [id number]  [phone]      Lindsay CritchleyFrancesca L Glynn, MD Go on 12/24/2016 For hospital follow up appointment at 11:15AM  [address]  [address]  [place] [id number]  [phone]      Constance Goltzrew G Jones, MD Call in 2 weeks As needed, If symptoms worsen [address]  [address]  [place] [id number]  [phone]                Total time in minutes spent coordinating this discharge (includes going over instructions, follow-up, prescriptions, and preparing report for sign off to her PCP) :  30  minutes    Signed:  Hinda LenisEsteban A. Zaira Iacovelli, MD

## 2016-12-13 NOTE — Progress Notes (Signed)
Patient on discharge report dated 12/10/16 from [facility]. Admission dates: 12/07/16 - 12/10/16. Diagnosis: Acute respiratory failure with hypoxia. Left message on voicemail to return call & reminded of f/u appt tomorrow 12/14/16.  Will attempt to contact again.  Need to complete post-discharge assessment.

## 2016-12-14 ENCOUNTER — Encounter: Attending: Internal Medicine | Primary: Internal Medicine

## 2016-12-14 NOTE — Progress Notes (Signed)
Call placed to pt, no answer. VM left requesting pt make a f/u appt this week with PCP instead of waiting to be seen 11/23/16.

## 2016-12-16 NOTE — Telephone Encounter (Signed)
Patient called morning stating she received the message for the Nurse Navigator. She is unable to come this week and she will keep her appointment for June 22,2018 with Dr Darl PikesGlynn. Patient ask no more calls. If she has a problem she will call the office or go to ER

## 2016-12-16 NOTE — Telephone Encounter (Signed)
See NN note

## 2016-12-16 NOTE — Progress Notes (Signed)
Lindsay GinsbergPamela Q [name]      ?? 12/16/16 9:14 AM   Note      Patient called morning stating she received the message for the Nurse Navigator. She is unable to come this week and she will keep her appointment for June 22,2018 with Dr Darl PikesGlynn. Patient ask no more calls. If she has a problem she will call the office or go to ER         VM received from pt stating that she will not be able to attend an earlier appt & would prefer to wait until 12/24/16 appt with PCP. Pt stated that she lives 1:15 minutes away & does not feel up to coming into the office. Pt also voiced that she does not like to take calls or speak on the phone, which is why she does not return this NN calls.

## 2016-12-24 ENCOUNTER — Encounter: Attending: Internal Medicine | Primary: Internal Medicine

## 2016-12-26 MED ORDER — ROPINIROLE 1 MG TAB
1 mg | ORAL_TABLET | ORAL | 1 refills | Status: DC
Start: 2016-12-26 — End: 2017-02-07

## 2016-12-28 ENCOUNTER — Encounter: Attending: Internal Medicine | Primary: Internal Medicine

## 2016-12-30 ENCOUNTER — Encounter

## 2016-12-31 MED ORDER — PRAVASTATIN 40 MG TAB
40 mg | ORAL_TABLET | ORAL | 0 refills | Status: DC
Start: 2016-12-31 — End: 2019-06-12

## 2017-01-04 ENCOUNTER — Encounter: Attending: Neurology | Primary: Internal Medicine

## 2017-01-06 ENCOUNTER — Inpatient Hospital Stay
Admit: 2017-01-06 | Discharge: 2017-01-06 | Disposition: A | Discharge status: Home / Self Care | Payer: BLUE CROSS/BLUE SHIELD | Attending: Emergency Medicine

## 2017-01-06 DIAGNOSIS — M533 Sacrococcygeal disorders, not elsewhere classified: Secondary | ICD-10-CM

## 2017-01-06 MED ORDER — HYDROCODONE-ACETAMINOPHEN 7.5 MG-325 MG TAB
ORAL_TABLET | Freq: Four times a day (QID) | ORAL | 0 refills | Status: DC | PRN
Start: 2017-01-06 — End: 2017-03-18

## 2017-01-06 MED ORDER — HYDROCODONE-ACETAMINOPHEN 5 MG-325 MG TAB
5-325 mg | ORAL | Status: AC
Start: 2017-01-06 — End: 2017-01-06
  Administered 2017-01-06: 15:00:00 via ORAL

## 2017-01-06 MED FILL — HYDROCODONE-ACETAMINOPHEN 5 MG-325 MG TAB: 5-325 mg | ORAL | Qty: 2

## 2017-01-06 NOTE — ED Notes (Signed)
Pt resting comfortably at this time, no further needs expressed.

## 2017-01-06 NOTE — ED Notes (Signed)
Discharge instructions given to patient by MD Hinton RaoStadler.  Verbalized understanding of instructions.  Patient discharged without difficulty.  Patient discharged in stable condition via ambulation, pt declined wheelchair, accompanied by husband.

## 2017-01-06 NOTE — ED Notes (Addendum)
Assumed care of patient. Patient states she has had left lower back pain for the past week, [facility] with no relief. Patient has taken muscle relaxor, and steroids to help. Patient is ANO x 4. Family at bedside. Cal bell in reach.

## 2017-01-06 NOTE — ED Notes (Signed)
Bedside shift change report given to Ryan,RN  (oncoming nurse) by E. Grubb, RN  (offgoing nurse). Report included the following information SBAR.

## 2017-01-06 NOTE — ED Notes (Signed)
Bedside and Verbal shift change report received from Verdene LennertEmily G, Rn Physiological scientist(offgoing nurse) given to Wyvonnia Duskyyan J, RN (oncoming nurse). Report included the following information SBAR, Kardex, ED Summary, MAR, Recent Results and Med Rec Status.

## 2017-01-06 NOTE — ED Provider Notes (Signed)
EMERGENCY DEPARTMENT HISTORY AND PHYSICAL EXAM      Date: 01/06/2017  Patient Name: Lindsay [name]    History of Presenting Illness     Chief Complaint   Patient presents with   ??? Back Pain     L lower back pain x 2 weeks Seen at [facility] for same without dx Pt states Sat at [facility] she was given steroids and pain medications without relief       History Provided By: Patient    HPI: Lindsay [name], 61 y.o. female with PMHx significant for pulmonary embolism, presents pulmonary embolism, DVT, TIA, restless leg syndrome, DVT to the ED with cc of left lower back pain x ~2 weeks. Patient also reports hip pain, and pain/tingling to her lower extremities. Pt states she was seen at [facility] for similar symptoms ~1 week ago, was prescribed muscle relaxants with no relief, and has been taking Tylenol and Motrin with some relief. She states she returned to [facility] 6 days later, and was prescribed prednisone and pain medicine, which she has been taking without improvement. Additionally, pt states she was diagnosed with and takes medication for restless leg syndrome, which she reports worsens her back pain. Patient states she has been unable to arrange for follow up with Orthopedics until the end of (01/2017). Patient reports she last took her medications last night. She does not smoke but drinks moderately every day. Patient specifically denies loss of bowel/bladder function, numbness, unsteady gait, difficulty ambulating, nausea, vomiting, fevers and chills.     There are no other complaints, changes, or physical findings at this time.    PCP: Clarene CritchleyFrancesca L Glynn, MD    Current Outpatient Prescriptions   Medication Sig Dispense Refill   ??? HYDROcodone-acetaminophen (NORCO) 7.5-325 mg per tablet Take 1 Tab by mouth every six (6) hours as needed for Pain. Max Daily Amount: 4 Tabs. 20 Tab 0   ??? pravastatin (PRAVACHOL) 40 mg tablet 1 po every day Please make a med  check appointment in the next month. Thank you. 30 Tab 0   ??? rOPINIRole (REQUIP) 1 mg tablet TAKE 2 TABLETS BY MOUTH 1-3 HOURS BEFORE BEDTIME 60 Tab 1   ??? ferrous sulfate 325 mg (65 mg iron) tablet Take 1 Tab by mouth daily (with breakfast). 10 Tab 0   ??? diazePAM (VALIUM) 5 mg tablet Take 5 mg by mouth daily. 5 mg every morning and 10 mg QHS     ??? traMADol (ULTRAM) 50 mg tablet Take 1 Tab by mouth every six (6) hours as needed for Pain. Max Daily Amount: 200 mg. 20 Tab 0   ??? rOPINIRole (REQUIP) 1 mg tablet Take 4 tabs 1-3 hours before bedtime. 120 Tab 2   ??? diazePAM (VALIUM) 5 mg tablet Take 10 mg by mouth nightly. 5 mg every morning and 10 mg QHS  5   ??? gabapentin enacarbil (HORIZANT) 600 mg TbER Take 1 Tab by mouth every evening. 30 Tab 11   ??? aspirin 81 mg chewable tablet Take 1 Tab by mouth daily. 30 Tab 0   ??? ergocalciferol (ERGOCALCIFEROL) 50,000 unit capsule Take 1 Cap by mouth every seven (7) days. 12 Cap 1   ??? escitalopram oxalate (LEXAPRO) 20 mg tablet Take 1 Tab by mouth daily. 30 Tab 5   ??? omeprazole (PRILOSEC) 20 mg capsule Take 20 mg by mouth nightly.         Past History     Past Medical History:  Past Medical History:   Diagnosis Date   ??? Abnormal brain scan    ??? Arthritis    ??? Chronic pain    ??? Depression    ??? DVT (deep venous thrombosis) (HCC)    ??? Falls frequently    ??? Fracture of left ankle    ??? Headache(784.0) 2011   ??? Ill-defined condition 09/15/2015    FX R HAND   ??? Other and unspecified hyperlipidemia 12/27/2012   ??? PE (pulmonary embolism) 12/2012   ??? RLS (restless legs syndrome) 03/16/2016    Dr Scarlett Presto   ??? TIA (transient ischemic attack) 03/2016    Right facial droop, numbness in lip   ??? Torn rotator cuff     right       Past Surgical History:  Past Surgical History:   Procedure Laterality Date   ??? BREAST SURGERY PROCEDURE UNLISTED  2002    AUGMENTATION   ??? HX APPENDECTOMY     ??? HX CESAREAN SECTION      x4   ??? HX GI      COLONOSCOPY   ??? HX KNEE REPLACEMENT Left 10/29/2015    ??? HX ORTHOPAEDIC  08/2004    rotater cuff r. shoulder   ??? HX ORTHOPAEDIC      left ankle fracture treated with air cast   ??? HX ORTHOPAEDIC  6/13    left shoulder fxt and dislocation   ??? HX OTHER SURGICAL  2010    left foot torn tendon   ??? HX TUBAL LIGATION         Family History:  Family History   Problem Relation Age of Onset   ??? Heart Disease Mother    ??? Heart Disease Father    ??? Cancer Father      thyroid cancer   ??? Arthritis-rheumatoid Sister    ??? No Known Problems Son    ??? No Known Problems Daughter    ??? No Known Problems Daughter    ??? No Known Problems Daughter    ??? Anesth Problems Neg Hx        Social History:  Social History   Substance Use Topics   ??? Smoking status: Never Smoker   ??? Smokeless tobacco: Never Used   ??? Alcohol use 8.4 oz/week     14 Cans of beer per week      Comment: 2 MICHELOB ULTRA PER DAY       Allergies:  No Known Allergies      Review of Systems   Review of Systems   Constitutional: Negative.  Negative for activity change, appetite change, chills, fatigue, fever and unexpected weight change.   HENT: Negative.  Negative for congestion, hearing loss, rhinorrhea, sneezing and voice change.    Eyes: Negative.  Negative for pain and visual disturbance.   Respiratory: Negative.  Negative for apnea, cough, choking, chest tightness and shortness of breath.    Cardiovascular: Negative.  Negative for chest pain and palpitations.   Gastrointestinal: Negative.  Negative for abdominal distention, abdominal pain, blood in stool, diarrhea, nausea and vomiting.   Genitourinary: Negative for difficulty urinating, flank pain, frequency and urgency.        No discharge   Musculoskeletal: Positive for arthralgias (Hip pain), back pain (Lower) and myalgias (LE pain/tingling). Negative for gait problem and neck stiffness.        Positive for LE pain/tingling   Skin: Negative.  Negative for color change and rash.   Neurological: Negative.  Negative  for dizziness, seizures, syncope, speech  difficulty, weakness, numbness and headaches.        Negative for loss of bowel/bladder function   Hematological: Negative for adenopathy.   Psychiatric/Behavioral: Negative.  Negative for agitation, behavioral problems, dysphoric mood and suicidal ideas. The patient is not nervous/anxious.        Physical Exam   Physical Exam   Constitutional: She is oriented to person, place, and time. She appears well-developed and well-nourished. No distress.   HENT:   Head: Normocephalic and atraumatic.   Mouth/Throat: Oropharynx is clear and moist. No oropharyngeal exudate.   Eyes: Conjunctivae and EOM are normal. Pupils are equal, round, and reactive to light. Right eye exhibits no discharge. Left eye exhibits no discharge.   Neck: Normal range of motion. Neck supple.   Cardiovascular: Normal rate, regular rhythm and intact distal pulses.  Exam reveals no gallop and no friction rub.    No murmur heard.  Pulmonary/Chest: Effort normal and breath sounds normal. No respiratory distress. She has no wheezes. She has no rales. She exhibits no tenderness.   Abdominal: Soft. Bowel sounds are normal. She exhibits no distension and no mass. There is no tenderness. There is no rebound and no guarding.   Musculoskeletal: Normal range of motion. She exhibits no edema.   Left SI joint tenderness   Lymphadenopathy:     She has no cervical adenopathy.   Neurological: She is alert and oriented to person, place, and time. No cranial nerve deficit. Coordination normal.   Skin: Skin is warm and dry. No rash noted. No erythema.   Psychiatric: She has a normal mood and affect.   Nursing note and vitals reviewed.    Medical Decision Making   I am the first provider for this patient.    I reviewed the vital signs, available nursing notes, past medical history, past surgical history, family history and social history.    Vital Signs-Reviewed the patient's vital signs.  Patient Vitals for the past 12 hrs:   Temp Pulse Resp BP SpO2    01/06/17 0954 98.5 ??F (36.9 ??C) 67 22 126/87 99 %       Pulse Oximetry Analysis - 99% on room air    Cardiac Monitor:   Rate: 67 bpm  Rhythm: Normal sinus rhythm     Records Reviewed: Nursing Notes, Old Medical Records, Previous Radiology Studies and Previous Laboratory Studies    Provider Notes (Medical Decision Making):   DDx: Strain, sprain, sacroiliitis, muscle spasm    ED Course:   Initial assessment performed. The patients presenting problems have been discussed, and they are in agreement with the care plan formulated and outlined with them.  I have encouraged them to ask questions as they arise throughout their visit.    1:03 PM  The patient states that their symptoms have resolved and they feel much better. There are no other new complaints at this time.  Her questions have been answered.  We are awaiting final results and those will be reviewed with them when they become available.    Critical Care Time:   0 minutes    Disposition:  1:03 PM  Olegario Messier A Gosling's  results have been reviewed with her.  She has been counseled regarding her diagnosis.  She verbally conveys understanding and agreement of the signs, symptoms, diagnosis, treatment and prognosis and additionally agrees to follow up as recommended with Dr. Clarene Critchley, MD in 24 - 48 hours.  She also agrees with the  care-plan and conveys that all of her questions have been answered.  I have also put together some discharge instructions for her that include: 1) educational information regarding their diagnosis, 2) how to care for their diagnosis at home, as well a 3) list of reasons why they would want to return to the ED prior to their follow-up appointment, should their condition change.    PLAN:  1.   Current Discharge Medication List      START taking these medications    Details   HYDROcodone-acetaminophen (NORCO) 7.5-325 mg per tablet Take 1 Tab by mouth every six (6) hours as needed for Pain. Max Daily Amount: 4 Tabs.   Qty: 20 Tab, Refills: 0    Associated Diagnoses: Sacro ilial pain           2.   Follow-up Information     Follow up With Details Comments Contact Info    [facility] Call in 1 day  [address]  [address]  [place] [address]  [phone]        Return to ED if worse     Diagnosis     Clinical Impression:   1. Sacro ilial pain        Attestations:  This note is prepared by Sharyon Cable, acting as Neurosurgeon for [employer]. Hinton Rao, MD.    Raynelle Bring. Hinton Rao, MD: The scribe's documentation has been prepared under my direction and personally reviewed by me in its entirety. I confirm that the note above accurately reflects all work, treatment, procedures, and medical decision making performed by me.

## 2017-01-06 NOTE — ED Notes (Signed)
Patient medicated, and ambulatory to bathroom with steady gait.

## 2017-01-06 NOTE — ED Notes (Signed)
Lindsay [name], Ortho NP at bedside.

## 2017-01-12 ENCOUNTER — Emergency Department: Admit: 2017-01-12 | Payer: BLUE CROSS/BLUE SHIELD | Primary: Internal Medicine

## 2017-01-12 ENCOUNTER — Inpatient Hospital Stay
Admit: 2017-01-12 | Discharge: 2017-01-13 | Disposition: A | Discharge status: Home / Self Care | Payer: BLUE CROSS/BLUE SHIELD | Attending: Emergency Medicine

## 2017-01-12 DIAGNOSIS — R0789 Other chest pain: Secondary | ICD-10-CM

## 2017-01-12 LAB — URINALYSIS W/ REFLEX CULTURE
Bacteria: NEGATIVE /hpf
Bilirubin: NEGATIVE
Blood: NEGATIVE
Glucose: NEGATIVE mg/dL
Ketone: NEGATIVE mg/dL
Leukocyte Esterase: NEGATIVE
Nitrites: NEGATIVE
Protein: NEGATIVE mg/dL
Specific gravity: 1.015 (ref 1.003–1.030)
Urobilinogen: 0.2 EU/dL (ref 0.2–1.0)
pH (UA): 7 (ref 5.0–8.0)

## 2017-01-12 LAB — CBC WITH AUTOMATED DIFF
ABS. BASOPHILS: 0 10*3/uL (ref 0.0–0.1)
ABS. EOSINOPHILS: 0.3 10*3/uL (ref 0.0–0.4)
ABS. IMM. GRANS.: 0 10*3/uL (ref 0.00–0.04)
ABS. LYMPHOCYTES: 2.4 10*3/uL (ref 0.8–3.5)
ABS. MONOCYTES: 0.6 10*3/uL (ref 0.0–1.0)
ABS. NEUTROPHILS: 3.7 10*3/uL (ref 1.8–8.0)
ABSOLUTE NRBC: 0 10*3/uL (ref 0.00–0.01)
BASOPHILS: 1 % (ref 0–1)
EOSINOPHILS: 4 % (ref 0–7)
HCT: 31.9 % — ABNORMAL LOW (ref 35.0–47.0)
HGB: 9.7 g/dL — ABNORMAL LOW (ref 11.5–16.0)
IMMATURE GRANULOCYTES: 1 % — ABNORMAL HIGH (ref 0.0–0.5)
LYMPHOCYTES: 34 % (ref 12–49)
MCH: 23.7 PG — ABNORMAL LOW (ref 26.0–34.0)
MCHC: 30.4 g/dL (ref 30.0–36.5)
MCV: 77.8 FL — ABNORMAL LOW (ref 80.0–99.0)
MONOCYTES: 8 % (ref 5–13)
MPV: 8.8 FL — ABNORMAL LOW (ref 8.9–12.9)
NEUTROPHILS: 53 % (ref 32–75)
NRBC: 0 PER 100 WBC
PLATELET: 284 10*3/uL (ref 150–400)
RBC: 4.1 M/uL (ref 3.80–5.20)
RDW: 15.1 % — ABNORMAL HIGH (ref 11.5–14.5)
WBC: 7 10*3/uL (ref 3.6–11.0)

## 2017-01-12 LAB — METABOLIC PANEL, COMPREHENSIVE
A-G Ratio: 1 — ABNORMAL LOW (ref 1.1–2.2)
ALT (SGPT): 29 U/L (ref 12–78)
AST (SGOT): 25 U/L (ref 15–37)
Albumin: 3.6 g/dL (ref 3.5–5.0)
Alk. phosphatase: 163 U/L — ABNORMAL HIGH (ref 45–117)
Anion gap: 7 mmol/L (ref 5–15)
BUN/Creatinine ratio: 18 (ref 12–20)
BUN: 14 MG/DL (ref 6–20)
Bilirubin, total: 0.2 MG/DL (ref 0.2–1.0)
CO2: 28 mmol/L (ref 21–32)
Calcium: 9.3 MG/DL (ref 8.5–10.1)
Chloride: 100 mmol/L (ref 97–108)
Creatinine: 0.8 MG/DL (ref 0.55–1.02)
GFR est AA: >60 mL/min/{1.73_m2} (ref >60)
GFR est non-AA: >60 mL/min/{1.73_m2} (ref >60)
Globulin: 3.7 g/dL (ref 2.0–4.0)
Glucose: 85 mg/dL (ref 65–100)
Potassium: 4.2 mmol/L (ref 3.5–5.1)
Protein, total: 7.3 g/dL (ref 6.4–8.2)
Sodium: 135 mmol/L — ABNORMAL LOW (ref 136–145)

## 2017-01-12 LAB — CK W/ REFLX CKMB: CK: 77 U/L (ref 26–192)

## 2017-01-12 LAB — TROPONIN I: Troponin-I, Qt.: <0.05 ng/mL (ref <0.05)

## 2017-01-12 NOTE — ED Provider Notes (Signed)
EMERGENCY DEPARTMENT HISTORY AND PHYSICAL EXAM      Date: 01/12/2017  Patient Name: Lindsay [name]    History of Presenting Illness     Chief Complaint   Patient presents with   ??? Chest Pain     Ambulatory into the ED with c/o back pain x 3 weeks, CP and cough x today.   ??? Cough   ??? Back Pain       History Provided By: Patient    HPI: Lindsay [name], 61 y.o. female with PMHx significant for depression, PE/DVT, and TIA, presents via wheelchair to the ED with cc of new onset, intermittent, sharp, non-radiating sternal chest pain persisting over the last 2 weeks. Pt reports associated sx of SOB, nausea, mild diarrhea, and dry cough as well. She expresses over the last 2 weeks she has been having intermittent episodes of sternal chest pain lasting anywhere from 10 minutes to 2 hours prior to resolving. Pt discloses onset of chest pain around 1400 this afternoon and has been constant since leading her to the ED. She is also c/o right sided lower back pain for which she was seen in the ED for last week. Pt notes associated numbness / tingling down the RLE. She endorses some relief with Prednisone and Hydrocodone. Pt has a h/o DVT/PE x5 but is not currently anticoagulated. She denies any fevers, chills, abdominal pain, vomiting, dysuria, hematuria, leg swelling, leg pain, saddle paraesthesias or fecal / urinary incontinence.       There are no other complaints, changes, or physical findings at this time.    PCP: Flora Lipps, MD    Current Outpatient Prescriptions   Medication Sig Dispense Refill   ??? HYDROcodone-acetaminophen (NORCO) 7.5-325 mg per tablet Take 1 Tab by mouth every six (6) hours as needed for Pain. Max Daily Amount: 4 Tabs. 20 Tab 0   ??? pravastatin (PRAVACHOL) 40 mg tablet 1 po every day Please make a med check appointment in the next month. Thank you. 30 Tab 0   ??? rOPINIRole (REQUIP) 1 mg tablet TAKE 2 TABLETS BY MOUTH 1-3 HOURS BEFORE BEDTIME 60 Tab 1    ??? ferrous sulfate 325 mg (65 mg iron) tablet Take 1 Tab by mouth daily (with breakfast). 10 Tab 0   ??? diazePAM (VALIUM) 5 mg tablet Take 5 mg by mouth daily. 5 mg every morning and 10 mg QHS     ??? traMADol (ULTRAM) 50 mg tablet Take 1 Tab by mouth every six (6) hours as needed for Pain. Max Daily Amount: 200 mg. 20 Tab 0   ??? rOPINIRole (REQUIP) 1 mg tablet Take 4 tabs 1-3 hours before bedtime. 120 Tab 2   ??? diazePAM (VALIUM) 5 mg tablet Take 10 mg by mouth nightly. 5 mg every morning and 10 mg QHS  5   ??? gabapentin enacarbil (HORIZANT) 600 mg TbER Take 1 Tab by mouth every evening. 30 Tab 11   ??? aspirin 81 mg chewable tablet Take 1 Tab by mouth daily. 30 Tab 0   ??? ergocalciferol (ERGOCALCIFEROL) 50,000 unit capsule Take 1 Cap by mouth every seven (7) days. 12 Cap 1   ??? escitalopram oxalate (LEXAPRO) 20 mg tablet Take 1 Tab by mouth daily. 30 Tab 5   ??? omeprazole (PRILOSEC) 20 mg capsule Take 20 mg by mouth nightly.         Past History     Past Medical History:  Past Medical History:   Diagnosis Date   ???  Abnormal brain scan    ??? Arthritis    ??? Chronic pain    ??? Depression    ??? DVT (deep venous thrombosis) ([place])    ??? Falls frequently    ??? Fracture of left ankle    ??? Headache(784.0) 2011   ??? Ill-defined condition 09/15/2015    FX R HAND   ??? Other and unspecified hyperlipidemia 12/27/2012   ??? PE (pulmonary embolism) 12/2012   ??? RLS (restless legs syndrome) 03/16/2016    Dr Roseanne Reno   ??? TIA (transient ischemic attack) 03/2016    Right facial droop, numbness in lip   ??? Torn rotator cuff     right       Past Surgical History:  Past Surgical History:   Procedure Laterality Date   ??? BREAST SURGERY PROCEDURE UNLISTED  2002    AUGMENTATION   ??? HX APPENDECTOMY     ??? HX CESAREAN SECTION      x4   ??? HX GI      COLONOSCOPY   ??? 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 HX ORTHOPAEDIC  08/2004    rotater cuff r. shoulder   ??? HX ORTHOPAEDIC      left ankle fracture treated with air cast   ??? HX ORTHOPAEDIC  6/13     left shoulder fxt and dislocation   ??? HX OTHER SURGICAL  2010    left foot torn tendon   ??? HX TUBAL LIGATION         Family History:  Family History   Problem Relation Age of Onset   ??? Heart Disease Mother    ??? Heart Disease Father    ??? Cancer Father      thyroid cancer   ??? Arthritis-rheumatoid Sister    ??? No Known Problems Son    ??? No Known Problems Daughter    ??? No Known Problems Daughter    ??? No Known Problems Daughter    ??? Anesth Problems Neg Hx        Social History:  Social History   Substance Use Topics   ??? Smoking status: Never Smoker   ??? Smokeless tobacco: Never Used   ??? Alcohol use 8.4 oz/week     14 Cans of beer per week      Comment: 2 MICHELOB ULTRA PER DAY       Allergies:  No Known Allergies      Review of Systems   Review of Systems   Constitutional: Negative for chills, fatigue and fever.   HENT: Negative for congestion, rhinorrhea and sore throat.    Eyes: Negative for pain, discharge and visual disturbance.   Respiratory: Positive for cough (dry ) and shortness of breath. Negative for chest tightness and wheezing.    Cardiovascular: Positive for chest pain (intermittent; sternal ). Negative for palpitations and leg swelling.   Gastrointestinal: Positive for diarrhea and nausea. Negative for abdominal pain, constipation and vomiting.        (-) fecal incontinence   Genitourinary: Negative for dysuria, frequency and hematuria.        (-) urinary incontinence    Musculoskeletal: Positive for back pain (lower ). Negative for arthralgias and myalgias.   Skin: Negative for rash.   Neurological: Positive for numbness (RLE). Negative for dizziness, weakness, light-headedness and headaches.        (-) saddle paraesthesias    Psychiatric/Behavioral: Negative.        Physical Exam   Physical Exam  Constitutional: She is oriented to person, place, and time. She appears well-developed and well-nourished. No distress.   HENT:   Head: Normocephalic and atraumatic.    Eyes: EOM are normal. Right eye exhibits no discharge. Left eye exhibits no discharge. No scleral icterus.   Neck: Normal range of motion. Neck supple. No tracheal deviation present.   Cardiovascular: Normal rate, regular rhythm, normal heart sounds and intact distal pulses.  Exam reveals no gallop and no friction rub.    No murmur heard.  Pulmonary/Chest: Effort normal and breath sounds normal. No respiratory distress. She has no wheezes. She has no rales.   Abdominal: Soft. She exhibits no distension. There is no tenderness.   Musculoskeletal: Normal range of motion. She exhibits no edema.   Dorsiflexion of the B/L great toes intact  Lower thoracic and lumbar midline tenderness, no step offs, no erythema overlying the spine  No calf tenderness    Lymphadenopathy:     She has no cervical adenopathy.   Neurological: She is alert and oriented to person, place, and time.   No focal neuro deficits  Strength is 5/5 in all extremities    Skin: Skin is warm and dry. No rash noted.   Psychiatric: She has a normal mood and affect.   Nursing note and vitals reviewed.      Diagnostic Study Results     Labs -     Recent Results (from the past 12 hour(s))   EKG, 12 LEAD, INITIAL    Collection Time: 01/12/17  3:37 PM   Result Value Ref Range    Ventricular Rate 65 BPM    Atrial Rate 65 BPM    P-R Interval 138 ms    QRS Duration 80 ms    Q-T Interval 406 ms    QTC Calculation (Bezet) 422 ms    Calculated P Axis 25 degrees    Calculated R Axis 18 degrees    Calculated T Axis 28 degrees    Diagnosis       ** Poor data quality, interpretation may be adversely affected  Normal sinus rhythm  Normal ECG  When compared with ECG of 09-Dec-2016 10:29,  No significant change was found     CBC WITH AUTOMATED DIFF    Collection Time: 01/12/17  4:05 PM   Result Value Ref Range    WBC 7.0 3.6 - 11.0 K/uL    RBC 4.10 3.80 - 5.20 M/uL    HGB 9.7 (L) 11.5 - 16.0 g/dL    HCT 31.9 (L) 35.0 - 47.0 %    MCV 77.8 (L) 80.0 - 99.0 FL     MCH 23.7 (L) 26.0 - 34.0 PG    MCHC 30.4 30.0 - 36.5 g/dL    RDW 15.1 (H) 11.5 - 14.5 %    PLATELET 284 150 - 400 K/uL    MPV 8.8 (L) 8.9 - 12.9 FL    NRBC 0.0 0 PER 100 WBC    ABSOLUTE NRBC 0.00 0.00 - 0.01 K/uL    NEUTROPHILS 53 32 - 75 %    LYMPHOCYTES 34 12 - 49 %    MONOCYTES 8 5 - 13 %    EOSINOPHILS 4 0 - 7 %    BASOPHILS 1 0 - 1 %    IMMATURE GRANULOCYTES 1 (H) 0.0 - 0.5 %    ABS. NEUTROPHILS 3.7 1.8 - 8.0 K/UL    ABS. LYMPHOCYTES 2.4 0.8 - 3.5 K/UL    ABS. MONOCYTES 0.6 0.0 -  1.0 K/UL    ABS. EOSINOPHILS 0.3 0.0 - 0.4 K/UL    ABS. BASOPHILS 0.0 0.0 - 0.1 K/UL    ABS. IMM. GRANS. 0.0 0.00 - 0.04 K/UL    DF AUTOMATED     METABOLIC PANEL, COMPREHENSIVE    Collection Time: 01/12/17  4:05 PM   Result Value Ref Range    Sodium 135 (L) 136 - 145 mmol/L    Potassium 4.2 3.5 - 5.1 mmol/L    Chloride 100 97 - 108 mmol/L    CO2 28 21 - 32 mmol/L    Anion gap 7 5 - 15 mmol/L    Glucose 85 65 - 100 mg/dL    BUN 14 6 - 20 MG/DL    Creatinine 0.80 0.55 - 1.02 MG/DL    BUN/Creatinine ratio 18 12 - 20      GFR est AA >60 >60 ml/min/1.4m    GFR est non-AA >60 >60 ml/min/1.788m   Calcium 9.3 8.5 - 10.1 MG/DL    Bilirubin, total 0.2 0.2 - 1.0 MG/DL    ALT (SGPT) 29 12 - 78 U/L    AST (SGOT) 25 15 - 37 U/L    Alk. phosphatase 163 (H) 45 - 117 U/L    Protein, total 7.3 6.4 - 8.2 g/dL    Albumin 3.6 3.5 - 5.0 g/dL    Globulin 3.7 2.0 - 4.0 g/dL    A-G Ratio 1.0 (L) 1.1 - 2.2     TROPONIN I    Collection Time: 01/12/17  4:05 PM   Result Value Ref Range    Troponin-I, Qt. <0.05 <0.05 ng/mL   CK W/ REFLX CKMB    Collection Time: 01/12/17  4:05 PM   Result Value Ref Range    CK 77 26 - 192 U/L   URINALYSIS W/ REFLEX CULTURE    Collection Time: 01/12/17  5:47 PM   Result Value Ref Range    Color YELLOW/STRAW      Appearance CLEAR CLEAR      Specific gravity 1.015 1.003 - 1.030      pH (UA) 7.0 5.0 - 8.0      Protein NEGATIVE  NEG mg/dL    Glucose NEGATIVE  NEG mg/dL    Ketone NEGATIVE  NEG mg/dL    Bilirubin NEGATIVE  NEG       Blood NEGATIVE  NEG      Urobilinogen 0.2 0.2 - 1.0 EU/dL    Nitrites NEGATIVE  NEG      Leukocyte Esterase NEGATIVE  NEG      WBC 0-4 0 - 4 /hpf    RBC 0-5 0 - 5 /hpf    Epithelial cells FEW FEW /lpf    Bacteria NEGATIVE  NEG /hpf    UA:UC IF INDICATED CULTURE NOT INDICATED BY UA RESULT CNI      Hyaline cast 0-2 0 - 5 /lpf   TROPONIN I    Collection Time: 01/12/17  7:29 PM   Result Value Ref Range    Troponin-I, Qt. <0.05 <0.05 ng/mL   EKG, 12 LEAD, SUBSEQUENT    Collection Time: 01/12/17  7:29 PM   Result Value Ref Range    Ventricular Rate 65 BPM    Atrial Rate 65 BPM    P-R Interval 146 ms    QRS Duration 78 ms    Q-T Interval 428 ms    QTC Calculation (Bezet) 445 ms    Calculated P Axis 36 degrees  Calculated R Axis 18 degrees    Calculated T Axis 34 degrees    Diagnosis       Normal sinus rhythm  Normal ECG  When compared with ECG of 12-Jan-2017 15:37,  MANUAL COMPARISON REQUIRED, DATA IS UNCONFIRMED         Radiologic Studies -   XR SPINE LUMB 2 OR 3 V   Final Result   EXAM:  XR SPINE LUMB 2 OR 3 V  INDICATION: Low back pain.  COMPARISON: None.  ??  FINDINGS: AP, lateral and spot lateral views of the lumbar spine demonstrate  normal alignment.  The vertebral body heights and disc spaces are  well-preserved. There is facet arthrosis of the lower lumbar spine. There is no  fracture, subluxation or other acute abnormality.  This examination was  presented for interpretation at 1836 hours.  ??  IMPRESSION  IMPRESSION: Lumbar facet arthrosis. No acute abnormality.   XR CHEST PA LAT   Non-public Result   IMPRESSION: No acute findings.               Final Result                Medical Decision Making   I am the first provider for this patient.    I reviewed the vital signs, available nursing notes, past medical history, past surgical history, family history and social history.    Vital Signs-Reviewed the patient's vital signs.  Patient Vitals for the past 12 hrs:   Temp Pulse Resp BP SpO2    01/12/17 1800 - (!) 59 14 118/79 94 %   01/12/17 1535 98.4 ??F (36.9 ??C) 67 14 134/83 96 %       Pulse Oximetry Analysis - 96% on room air    Cardiac Monitor:   Rate: 67 bpm  Rhythm: Normal Sinus Rhythm      EKG interpretation: (Preliminary) 1537  Rhythm: normal sinus rhythm; and regular . Rate (approx.): 65; Axis: normal; PR interval: normal; QRS interval: normal ; ST/T wave: normal; Other findings: normal.    EKG interpretation: (Repeat) 1929  Rhythm: normal sinus rhythm; and regular . Rate (approx.): 65; Axis: normal; PR interval: normal; QRS interval: normal ; ST/T wave: normal; Other findings: normal.      Records Reviewed: Nursing Notes, Old Medical Records, Previous electrocardiograms, Previous Radiology Studies and Previous Laboratory Studies    Provider Notes (Medical Decision Making):   1. Chest pain, dyspnea - Differential includes atypical chest pain, stable angina, unstable angina, MI, PE, pleurisy, costochondritis, pneumonia, bronchitis, MSK pain.  Do not suspect dissection.   2. Back pain - musculoskeletal pain, sacroilial pain, sciatica, discitis, osteomyelitis, cauda equina syndrome, cord compression.     Disposition: Patient presents to ED with intermittent chest pain x 2 weeks.  EKG nonischemic.  Troponin negative.  Doubt PE - recent CTA on 6/4 negative for PE, no tachycardia/hypoxia.  CXR unremarkable today.  Low suspicion for ACS.  Back pain seems more chronic, recently treated with steroids/analgesics.  Lumbar series unremarkable.  Labs unremarkable.  Will discharge with follow up.    ED Course:   Initial assessment performed. The patients presenting problems have been discussed, and they are in agreement with the care plan formulated and outlined with them.  I have encouraged them to ask questions as they arise throughout their visit.    Progress Notes:  5:55 PM  Per chart review the pt was seen in the ED 6/5 for respiratory failure  with hypoxia.  CTA negative for PE on 6/4. Pt stayed over night and did not require any supplemental oxygen and was discharged the next day on iron supplements but no home oxygen.     7:30 PM  The pt has been re-evaluated.  Pt has ambulated without difficulty or foot drop.  She is feeling better and comfortable with plan for discharge home.  Discussed results, prescriptions and follow up plan with patient.  Provided customary return to ED instructions.  Patient expressed understanding.  Rip Harbour, MD    Critical Care Time: 0 minutes    Disposition:  Discharge Note:  8:26 PM  The patient is ready for discharge. The patient's signs, symptoms, diagnosis, and discharge instruction have been discussed and the patient has conveyed their understanding. The patient is to follow up as recommended or return to the ER should their symptoms worsen. Plan has been discussed and the patient is in agreement.  Written by Fredric Dine Barrett ED Scribe, as dictated by Ocie Cornfield, MD    PLAN:  1.   Current Discharge Medication List      START taking these medications    Details   loperamide (IMODIUM) 2 mg capsule Take 1 Cap by mouth four (4) times daily as needed for Diarrhea.  Qty: 20 Cap, Refills: 0           2.   Follow-up Information     Follow up With Details Comments Dustin Acres, MD In 2 days  [facility]  [place] [address]  [phone]      [facility]  As needed, If symptoms worsen [place]  [phone]        Return to ED if worse     Diagnosis     Clinical Impression:   1. Atypical chest pain    2. Diarrhea, unspecified type        Attestations:    Attestation:  This note is prepared by Danae Chen A. Barrett, acting as Education administrator for Ocie Cornfield, MD.      Ocie Cornfield, MD: The scribe's documentation has been prepared under my direction and personally reviewed by me in its entirety. I  confirm that the note above accurately reflects all work, treatment, procedures, and medical decision making performed by me.

## 2017-01-12 NOTE — ED Notes (Signed)
Bedside shift change report given to RN Tu N (oncoming nurse) by RN Shelly BombardHeidi E (offgoing nurse). Report included the following information SBAR, ED Summary and MAR.

## 2017-01-12 NOTE — ED Notes (Signed)
Discharge instructions reviewed with patient. Discharge instructions given to patient per Dr. Ashwell. Patient able to return/verbalize discharge instructions. Copy of discharge instructions given. Patient condition stable, respiratory status within normal limits, neuro status intact. Ambulatory out of ER, accompanied by family.

## 2017-01-12 NOTE — ED Notes (Signed)
Patient arrived with complaints of mid sternal chest pain for three weeks and lumbar back. Patient states that she has an Ortho appointment Friday for back pain. Patient denies any other symptoms at this time. Call bell within reach and family at bedside

## 2017-01-13 LAB — EKG 12-LEAD
Atrial Rate: 65 {beats}/min
Atrial Rate: 65 {beats}/min
Diagnosis: NORMAL
P Axis: 25 degrees
P Axis: 36 degrees
P-R Interval: 138 ms
P-R Interval: 146 ms
Q-T Interval: 406 ms
Q-T Interval: 428 ms
QRS Duration: 80 ms
QRS Duration: 78 ms
QTc Calculation (Bazett): 422 ms
QTc Calculation (Bazett): 445 ms
R Axis: 18 degrees
R Axis: 18 degrees
T Axis: 28 degrees
T Axis: 34 degrees
Ventricular Rate: 65 {beats}/min
Ventricular Rate: 65 {beats}/min

## 2017-01-13 LAB — EKG, 12 LEAD, INITIAL
Atrial Rate: 65 {beats}/min
Calculated P Axis: 25 degrees
Calculated R Axis: 18 degrees
Calculated T Axis: 28 degrees
P-R Interval: 138 ms
Q-T Interval: 406 ms
QRS Duration: 80 ms
QTC Calculation (Bezet): 422 ms
Ventricular Rate: 65 {beats}/min

## 2017-01-13 LAB — EKG, 12 LEAD, SUBSEQUENT
Atrial Rate: 65 {beats}/min
Calculated P Axis: 36 degrees
Calculated R Axis: 18 degrees
Calculated T Axis: 34 degrees
Diagnosis: NORMAL
P-R Interval: 146 ms
Q-T Interval: 428 ms
QRS Duration: 78 ms
QTC Calculation (Bezet): 445 ms
Ventricular Rate: 65 {beats}/min

## 2017-01-13 LAB — TROPONIN I: Troponin-I, Qt.: <0.05 ng/mL (ref <0.05)

## 2017-01-13 MED ORDER — LOPERAMIDE 2 MG CAP
2 mg | ORAL_CAPSULE | Freq: Four times a day (QID) | ORAL | 0 refills | Status: DC | PRN
Start: 2017-01-13 — End: 2017-02-07

## 2017-01-25 ENCOUNTER — Emergency Department: Admit: 2017-01-25 | Payer: BLUE CROSS/BLUE SHIELD | Primary: Internal Medicine

## 2017-01-25 ENCOUNTER — Inpatient Hospital Stay
Admit: 2017-01-25 | Discharge: 2017-01-25 | Disposition: A | Discharge status: Home / Self Care | Payer: BLUE CROSS/BLUE SHIELD | Attending: Emergency Medicine

## 2017-01-25 DIAGNOSIS — R0789 Other chest pain: Secondary | ICD-10-CM

## 2017-01-25 LAB — EKG 12-LEAD
Atrial Rate: 85 {beats}/min
P Axis: 29 degrees
P-R Interval: 142 ms
Q-T Interval: 390 ms
QRS Duration: 74 ms
QTc Calculation (Bazett): 464 ms
R Axis: 20 degrees
T Axis: 58 degrees
Ventricular Rate: 85 {beats}/min

## 2017-01-25 LAB — EKG, 12 LEAD, INITIAL
Atrial Rate: 85 {beats}/min
Calculated P Axis: 29 degrees
Calculated R Axis: 20 degrees
Calculated T Axis: 58 degrees
P-R Interval: 142 ms
Q-T Interval: 390 ms
QRS Duration: 74 ms
QTC Calculation (Bezet): 464 ms
Ventricular Rate: 85 {beats}/min

## 2017-01-25 LAB — CBC WITH AUTOMATED DIFF
ABS. BASOPHILS: 0 10*3/uL (ref 0.0–0.1)
ABS. EOSINOPHILS: 0.3 10*3/uL (ref 0.0–0.4)
ABS. IMM. GRANS.: 0 10*3/uL (ref 0.00–0.04)
ABS. LYMPHOCYTES: 1.5 10*3/uL (ref 0.8–3.5)
ABS. MONOCYTES: 0.4 10*3/uL (ref 0.0–1.0)
ABS. NEUTROPHILS: 4.2 10*3/uL (ref 1.8–8.0)
ABSOLUTE NRBC: 0 10*3/uL (ref 0.00–0.01)
BASOPHILS: 1 % (ref 0–1)
EOSINOPHILS: 5 % (ref 0–7)
HCT: 30.8 % — ABNORMAL LOW (ref 35.0–47.0)
HGB: 9.5 g/dL — ABNORMAL LOW (ref 11.5–16.0)
IMMATURE GRANULOCYTES: 1 % — ABNORMAL HIGH (ref 0.0–0.5)
LYMPHOCYTES: 23 % (ref 12–49)
MCH: 23.6 PG — ABNORMAL LOW (ref 26.0–34.0)
MCHC: 30.8 g/dL (ref 30.0–36.5)
MCV: 76.6 FL — ABNORMAL LOW (ref 80.0–99.0)
MONOCYTES: 6 % (ref 5–13)
MPV: 9 FL (ref 8.9–12.9)
NEUTROPHILS: 65 % (ref 32–75)
NRBC: 0 PER 100 WBC
PLATELET: 273 10*3/uL (ref 150–400)
RBC: 4.02 M/uL (ref 3.80–5.20)
RDW: 15.4 % — ABNORMAL HIGH (ref 11.5–14.5)
WBC: 6.4 10*3/uL (ref 3.6–11.0)

## 2017-01-25 LAB — METABOLIC PANEL, COMPREHENSIVE
A-G Ratio: 0.9 — ABNORMAL LOW (ref 1.1–2.2)
ALT (SGPT): 20 U/L (ref 12–78)
AST (SGOT): 18 U/L (ref 15–37)
Albumin: 3.6 g/dL (ref 3.5–5.0)
Alk. phosphatase: 167 U/L — ABNORMAL HIGH (ref 45–117)
Anion gap: 6 mmol/L (ref 5–15)
BUN/Creatinine ratio: 16 (ref 12–20)
BUN: 14 MG/DL (ref 6–20)
Bilirubin, total: 0.2 MG/DL (ref 0.2–1.0)
CO2: 28 mmol/L (ref 21–32)
Calcium: 9.1 MG/DL (ref 8.5–10.1)
Chloride: 103 mmol/L (ref 97–108)
Creatinine: 0.9 MG/DL (ref 0.55–1.02)
GFR est AA: >60 mL/min/{1.73_m2} (ref >60)
GFR est non-AA: >60 mL/min/{1.73_m2} (ref >60)
Globulin: 3.8 g/dL (ref 2.0–4.0)
Glucose: 124 mg/dL — ABNORMAL HIGH (ref 65–100)
Potassium: 3.9 mmol/L (ref 3.5–5.1)
Protein, total: 7.4 g/dL (ref 6.4–8.2)
Sodium: 137 mmol/L (ref 136–145)

## 2017-01-25 LAB — LIPASE: Lipase: 210 U/L (ref 73–393)

## 2017-01-25 LAB — TROPONIN I: Troponin-I, Qt.: <0.05 ng/mL (ref <0.05)

## 2017-01-25 LAB — POC TROPONIN-I: Troponin-I (POC): <0.04 ng/mL (ref 0.00–0.08)

## 2017-01-25 MED ORDER — LIDOCAINE 2 % MUCOSAL SOLN
2 % | Status: AC
Start: 2017-01-25 — End: 2017-01-25
  Administered 2017-01-25: 19:00:00 via OROMUCOSAL

## 2017-01-25 MED ORDER — ALUMINUM-MAGNESIUM HYDROXIDE 200 MG-200 MG/5 ML ORAL SUSP
200-200 mg/5 mL | ORAL | Status: AC
Start: 2017-01-25 — End: 2017-01-25
  Administered 2017-01-25: 19:00:00 via ORAL

## 2017-01-25 MED FILL — MAG-AL 200 MG-200 MG/5 ML ORAL SUSPENSION: 200-200 mg/5 mL | ORAL | Qty: 30

## 2017-01-25 MED FILL — LIDOCAINE VISCOUS 2 % MUCOSAL SOLUTION: 2 % | Qty: 15

## 2017-01-25 NOTE — ED Notes (Signed)
Pt discharged by Dr. Bryan LemmaAshwell. Pt provided with discharge instructions Rx and instructions on follow up care. Pt out of ED via wheelchair accompanied by this RN.

## 2017-01-25 NOTE — ED Triage Notes (Signed)
Assumed care of pt from triage.  Pt is A&O x 4. Pt reports CC of Back pain that she has had for several weeks now.  Was on left now on both.  Pt also reports CP since Friday.  Abdominal pain since Sat.  Right cath pain since this morning.  Pt reports hx of blood clots and is concerned for additional clots.  Pt also reports intermittent SOB.  Pt placed on monitor x 3. VSS  Pt ambulated to restroom on arrival to room.

## 2017-01-25 NOTE — ED Notes (Signed)
Pt reports ankle swelling, not noticed by this RN at this time.

## 2017-01-25 NOTE — ED Provider Notes (Signed)
EMERGENCY DEPARTMENT HISTORY AND PHYSICAL EXAM      Date: 01/25/2017  Patient Name: Lindsay [name]    History of Presenting Illness     Chief Complaint   Patient presents with   ??? Abdominal Pain     upper abdominal pain since saturday   ??? Chest Pain     L side chest pain radiating down L arm since Saturday   ??? Back Pain     chronic, supposed to have MRI last week per patient   ??? Leg Pain     R calf pain, pt concerned for DVT       History Provided By: Patient    HPI: Lindsay [name], 61 y.o. female with PMHx significant for TIA, DVT, PE, arthritis, and depression, presents ambulatory to the ED with cc of intermittent left sided CP since yesterday (01/24/17). She also reports non-radiating, burning epigastric pain and nausea today, as well as mild ABD distention. Pt states she has chronic left lower back pain, which has started radiating to her right lower back x "a few days," and reports difficulty ambulating secondary to her pain. She notes she had MRI with her Orthopedist (Dr. Gillian Scarce) last week, and will receive results during follow up appointment tomorrow (01/26/17). Pt also notes right posterior lower leg pain today. She denies currently taking anticoagulants. Pt denies recent travel, surgery, or prolonged immobilization, and denies currently receiving hormone replacement therapy. Pt denies hx of MI. She notes last BM yesterday. Pt specifically denies fever, chills, vomiting, diarrhea, cough, dysuria, hematuria, hemoptysis, bowel or bladder incontinence, and saddle anaesthesia.    There are no other complaints, changes, or physical findings at this time.    PCP: Flora Lipps, MD    Current Outpatient Prescriptions   Medication Sig Dispense Refill   ??? loperamide (IMODIUM) 2 mg capsule Take 1 Cap by mouth four (4) times daily as needed for Diarrhea. 20 Cap 0   ??? HYDROcodone-acetaminophen (NORCO) 7.5-325 mg per tablet Take 1 Tab by mouth every six (6) hours as needed for Pain. Max Daily Amount: 4 Tabs. 20 Tab 0    ??? pravastatin (PRAVACHOL) 40 mg tablet 1 po every day Please make a med check appointment in the next month. Thank you. 30 Tab 0   ??? rOPINIRole (REQUIP) 1 mg tablet TAKE 2 TABLETS BY MOUTH 1-3 HOURS BEFORE BEDTIME 60 Tab 1   ??? ferrous sulfate 325 mg (65 mg iron) tablet Take 1 Tab by mouth daily (with breakfast). 10 Tab 0   ??? diazePAM (VALIUM) 5 mg tablet Take 5 mg by mouth daily. 5 mg every morning and 10 mg QHS     ??? rOPINIRole (REQUIP) 1 mg tablet Take 4 tabs 1-3 hours before bedtime. 120 Tab 2   ??? diazePAM (VALIUM) 5 mg tablet Take 10 mg by mouth nightly. 5 mg every morning and 10 mg QHS  5   ??? gabapentin enacarbil (HORIZANT) 600 mg TbER Take 1 Tab by mouth every evening. 30 Tab 11   ??? aspirin 81 mg chewable tablet Take 1 Tab by mouth daily. 30 Tab 0   ??? ergocalciferol (ERGOCALCIFEROL) 50,000 unit capsule Take 1 Cap by mouth every seven (7) days. 12 Cap 1   ??? escitalopram oxalate (LEXAPRO) 20 mg tablet Take 1 Tab by mouth daily. 30 Tab 5   ??? omeprazole (PRILOSEC) 20 mg capsule Take 20 mg by mouth nightly.         Past History  Past Medical History:  Past Medical History:   Diagnosis Date   ??? Abnormal brain scan    ??? Arthritis    ??? Chronic pain    ??? Depression    ??? DVT (deep venous thrombosis) ([place])    ??? Falls frequently    ??? Fracture of left ankle    ??? Headache(784.0) 2011   ??? Ill-defined condition 09/15/2015    FX R HAND   ??? Other and unspecified hyperlipidemia 12/27/2012   ??? PE (pulmonary embolism) 12/2012   ??? RLS (restless legs syndrome) 03/16/2016    Dr Roseanne Reno   ??? TIA (transient ischemic attack) 03/2016    Right facial droop, numbness in lip   ??? Torn rotator cuff     right       Past Surgical History:  Past Surgical History:   Procedure Laterality Date   ??? BREAST SURGERY PROCEDURE UNLISTED  2002    AUGMENTATION   ??? HX APPENDECTOMY     ??? HX CESAREAN SECTION      x4   ??? HX GI      COLONOSCOPY   ??? 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 HX ORTHOPAEDIC  08/2004    rotater cuff r. shoulder    ??? HX ORTHOPAEDIC      left ankle fracture treated with air cast   ??? HX ORTHOPAEDIC  6/13    left shoulder fxt and dislocation   ??? HX OTHER SURGICAL  2010    left foot torn tendon   ??? HX TUBAL LIGATION         Family History:  Family History   Problem Relation Age of Onset   ??? Heart Disease Mother    ??? Heart Disease Father    ??? Cancer Father      thyroid cancer   ??? Arthritis-rheumatoid Sister    ??? No Known Problems Son    ??? No Known Problems Daughter    ??? No Known Problems Daughter    ??? No Known Problems Daughter    ??? Anesth Problems Neg Hx        Social History:  Social History   Substance Use Topics   ??? Smoking status: Never Smoker   ??? Smokeless tobacco: Never Used   ??? Alcohol use No      Comment: Pt denies       Allergies:  No Known Allergies      Review of Systems   Review of Systems   Constitutional: Negative for chills, fatigue and fever.   HENT: Negative for congestion, rhinorrhea and sore throat.    Eyes: Negative for pain, discharge and visual disturbance.   Respiratory: Negative for cough, chest tightness, shortness of breath and wheezing.         -hemoptysis   Cardiovascular: Positive for chest pain (left; intermittent). Negative for palpitations and leg swelling.   Gastrointestinal: Positive for abdominal distention (mild), abdominal pain (epigastric) and nausea. Negative for constipation, diarrhea and vomiting.        -incontinence   Genitourinary: Negative for dysuria, frequency and hematuria.        -incontinence   Musculoskeletal: Positive for myalgias (right posterior leg). Negative for arthralgias and back pain.   Skin: Negative for rash.   Neurological: Negative for dizziness, weakness, light-headedness, numbness and headaches.   Psychiatric/Behavioral: Negative.        Physical Exam   Physical Exam   Constitutional: She is oriented to person, place, and time. She  appears well-developed and well-nourished. No distress.   HENT:   Head: Normocephalic and atraumatic.    Eyes: EOM are normal. Right eye exhibits no discharge. Left eye exhibits no discharge. No scleral icterus.   Neck: Normal range of motion. Neck supple. No tracheal deviation present.   Cardiovascular: Normal rate, regular rhythm, normal heart sounds and intact distal pulses.  Exam reveals no gallop and no friction rub.    No murmur heard.  Pulses:       Dorsalis pedis pulses are 2+ on the right side, and 2+ on the left side.   Pulmonary/Chest: Effort normal and breath sounds normal. No respiratory distress. She has no wheezes. She has no rales.   Abdominal: Soft. She exhibits no distension. There is no tenderness.   Musculoskeletal: Normal range of motion. She exhibits no edema.   No calf erythema or asymmetry; no C or T spine tenderness; lumbar spine TTP   Lymphadenopathy:     She has no cervical adenopathy.   Neurological: She is alert and oriented to person, place, and time.   No focal neuro deficits; 5/5 strength dorsiflexion B/L great toes   Skin: Skin is warm and dry. No rash noted.   Psychiatric: She has a normal mood and affect.   Nursing note and vitals reviewed.      Diagnostic Study Results     Labs -     Recent Results (from the past 12 hour(s))   EKG, 12 LEAD, INITIAL    Collection Time: 01/25/17  1:07 PM   Result Value Ref Range    Ventricular Rate 85 BPM    Atrial Rate 85 BPM    P-R Interval 142 ms    QRS Duration 74 ms    Q-T Interval 390 ms    QTC Calculation (Bezet) 464 ms    Calculated P Axis 29 degrees    Calculated R Axis 20 degrees    Calculated T Axis 58 degrees    Diagnosis       Sinus rhythm with occasional premature ventricular complexes  Low voltage QRS  Nonspecific ST abnormality  Confirmed by Berta Minor [phone]) on 01/25/2017 3:17:07 PM     CBC WITH AUTOMATED DIFF    Collection Time: 01/25/17  2:35 PM   Result Value Ref Range    WBC 6.4 3.6 - 11.0 K/uL    RBC 4.02 3.80 - 5.20 M/uL    HGB 9.5 (L) 11.5 - 16.0 g/dL    HCT 30.8 (L) 35.0 - 47.0 %    MCV 76.6 (L) 80.0 - 99.0 FL     MCH 23.6 (L) 26.0 - 34.0 PG    MCHC 30.8 30.0 - 36.5 g/dL    RDW 15.4 (H) 11.5 - 14.5 %    PLATELET 273 150 - 400 K/uL    MPV 9.0 8.9 - 12.9 FL    NRBC 0.0 0 PER 100 WBC    ABSOLUTE NRBC 0.00 0.00 - 0.01 K/uL    NEUTROPHILS 65 32 - 75 %    LYMPHOCYTES 23 12 - 49 %    MONOCYTES 6 5 - 13 %    EOSINOPHILS 5 0 - 7 %    BASOPHILS 1 0 - 1 %    IMMATURE GRANULOCYTES 1 (H) 0.0 - 0.5 %    ABS. NEUTROPHILS 4.2 1.8 - 8.0 K/UL    ABS. LYMPHOCYTES 1.5 0.8 - 3.5 K/UL    ABS. MONOCYTES 0.4 0.0 - 1.0 K/UL    ABS.  EOSINOPHILS 0.3 0.0 - 0.4 K/UL    ABS. BASOPHILS 0.0 0.0 - 0.1 K/UL    ABS. IMM. GRANS. 0.0 0.00 - 0.04 K/UL    DF AUTOMATED     METABOLIC PANEL, COMPREHENSIVE    Collection Time: 01/25/17  2:35 PM   Result Value Ref Range    Sodium 137 136 - 145 mmol/L    Potassium 3.9 3.5 - 5.1 mmol/L    Chloride 103 97 - 108 mmol/L    CO2 28 21 - 32 mmol/L    Anion gap 6 5 - 15 mmol/L    Glucose 124 (H) 65 - 100 mg/dL    BUN 14 6 - 20 MG/DL    Creatinine 0.90 0.55 - 1.02 MG/DL    BUN/Creatinine ratio 16 12 - 20      GFR est AA >60 >60 ml/min/1.27m    GFR est non-AA >60 >60 ml/min/1.763m   Calcium 9.1 8.5 - 10.1 MG/DL    Bilirubin, total 0.2 0.2 - 1.0 MG/DL    ALT (SGPT) 20 12 - 78 U/L    AST (SGOT) 18 15 - 37 U/L    Alk. phosphatase 167 (H) 45 - 117 U/L    Protein, total 7.4 6.4 - 8.2 g/dL    Albumin 3.6 3.5 - 5.0 g/dL    Globulin 3.8 2.0 - 4.0 g/dL    A-G Ratio 0.9 (L) 1.1 - 2.2     TROPONIN I    Collection Time: 01/25/17  2:35 PM   Result Value Ref Range    Troponin-I, Qt. <0.05 <0.05 ng/mL   LIPASE    Collection Time: 01/25/17  2:35 PM   Result Value Ref Range    Lipase 210 73 - 393 U/L   EKG, 12 LEAD, SUBSEQUENT    Collection Time: 01/25/17  4:15 PM   Result Value Ref Range    Ventricular Rate 66 BPM    Atrial Rate 66 BPM    P-R Interval 152 ms    QRS Duration 80 ms    Q-T Interval 448 ms    QTC Calculation (Bezet) 469 ms    Calculated P Axis 18 degrees    Calculated R Axis 5 degrees    Calculated T Axis 45 degrees     Diagnosis       Normal sinus rhythm  Low voltage QRS  When compared with ECG of 25-Jan-2017 13:07,  premature ventricular complexes are no longer present     POC TROPONIN-I    Collection Time: 01/25/17  4:47 PM   Result Value Ref Range    Troponin-I (POC) <0.04 0.00 - 0.08 ng/mL       Radiologic Studies -   DUPLEX LOWER EXT VENOUS RIGHT   Final Result   Clinical indication: Leg swelling, pain, DVT suspected  ??  Unilateral venous Doppler performed on theright lower extremity shows no  evidence for deep venous thrombosis masses or fluid collection.  Color-flow, compression and real-time examination were performed. Spectral  analysis was performed  ??  IMPRESSION  IMPRESSION: Negative examination.     CXR Results  (Last 48 hours)               01/25/17 1610  XR CHEST PA LAT Final result    Impression:   impression: No acute changes.       Narrative:  Clinical indication: Chest pain.       Frontal and lateral views of the chest obtained, comparison July 11. The  heart   size is normal. There is no acute infiltrate.                   Medical Decision Making   I am the first provider for this patient.    I reviewed the vital signs, available nursing notes, past medical history, past surgical history, family history and social history.    Vital Signs-Reviewed the patient's vital signs.  Patient Vitals for the past 12 hrs:   Temp Pulse Resp BP SpO2   01/25/17 1734 - 68 12 108/67 96 %   01/25/17 1306 98.6 ??F (37 ??C) 98 19 132/75 98 %       Pulse Oximetry Analysis - 98% on RA    Cardiac Monitor:   Rate: 85 bpm  Rhythm: Sinus Rhythm      EKG interpretation: 13:07  Rhythm: sinus rhythm with PVC's. Rate (approx.): 85; Axis: normal; PR interval: normal; QRS interval: normal ; ST/T wave: non-specific T wave flattening.    EKG interpretation: 16:15  Rhythm: normal sinus rhythm; and regular . Rate (approx.): 66; Axis: normal; PR interval: normal; QRS interval: normal ; ST/T wave: normal.     Records Reviewed: Nursing Notes, Old Medical Records, Previous Radiology Studies and Previous Laboratory Studies    Provider Notes (Medical Decision Making):   Patient presents to ED with multiple complaints:  1. Chest/epigastric pain - GERD, gastritis, PUD, atypical chest pain, stable angina, unstable angina, MI, pleurisy, costochondritis, pneumonia, bronchitis, MSK pain.  Do not suspect dissection.  Doubt PE - CTA on 6/4 negative for PE.  No tachycardia or hypoxia.  - CBC, CMP, troponin, CXR  2. Back pain - Chronic, recent MRI with orthopedic surgery, has follow up tomorrow for MRI results  3. Right posterior leg pain - NVI, duplex to rule out DVT    ED Course:   Initial assessment performed. The patients presenting problems have been discussed, and they are in agreement with the care plan formulated and outlined with them.  I have encouraged them to ask questions as they arise throughout their vis    PROGRESS NOTE:  2:11 PM  Pt seen on (01/12/17) for CP with unremarkable workup, had CTA (12/06/16), negative for PE.    PROGRESS NOTE:  5:30 PM  EKG nonischemic, troponin negative 2.  Labs, duplex, CXR unremarkable.  Low suspicion for ACS.  Will discharge with cardiology follow up.  Discussed diagnosis, follow up, return instructions, test results, x-rays and medications with the patient and/or family.  The patient and/or family have been given the opportunity to ask questions.  The patient and/or family express understanding of the plan of care, follow-up appointments and return instructions.  The patient and/or family agree to follow up with cardiology as directed and to return immediately if the chest pain worsens.  The patient and family express understanding that although cardiac testing at this time is negative, a cardiac problem could still be present and that a follow-up appointment with a cardiologist for further evaluation and risk factor modification is necessary to complete the  evaluation of this complaint.  Provided customary return to ED instructions.  Rip Harbour, MD    Disposition:  DISCHARGE NOTE:  5:31 PM  The patient is ready for discharge. The patient???s signs, symptoms, diagnosis, and instructions for discharge have been discussed and the pt has conveyed their understanding. The patient is to follow up as recommended or return to the ER should their symptoms worsen. Plan has been discussed and patient  has conveyed their agreement.    PLAN:  1.   Follow-up Information     Follow up With Details Comments [place], MD In 2 days  [place]  [place] [address]  [phone]      Jeronimo Norma III, DO  Please follow up with cardiology as soon as possible [address]  [address]  [place] [address]  [phone]      [facility] EMERGENCY DEPT  As needed, If symptoms worsen [place]  [phone]        Return to ED if worse     Diagnosis     Clinical Impression:   1. Atypical chest pain        Attestations:    This note is prepared by Malon Kindle, acting as Scribe for Ocie Cornfield, MD.    Ocie Cornfield, MD: The scribe's documentation has been prepared under my direction and personally reviewed by me in its entirety. I confirm that the note above accurately reflects all work, treatment, procedures, and medical decision making performed by me.

## 2017-01-25 NOTE — ED Notes (Signed)
Medicated pt per orders.  No difficulty noted while swallowing.

## 2017-01-27 LAB — EKG 12-LEAD
Atrial Rate: 66 {beats}/min
Diagnosis: NORMAL
P Axis: 18 degrees
P-R Interval: 152 ms
Q-T Interval: 448 ms
QRS Duration: 80 ms
QTc Calculation (Bazett): 469 ms
R Axis: 5 degrees
T Axis: 45 degrees
Ventricular Rate: 66 {beats}/min

## 2017-01-27 LAB — EKG, 12 LEAD, SUBSEQUENT
Atrial Rate: 66 {beats}/min
Calculated P Axis: 18 degrees
Calculated R Axis: 5 degrees
Calculated T Axis: 45 degrees
Diagnosis: NORMAL
P-R Interval: 152 ms
Q-T Interval: 448 ms
QRS Duration: 80 ms
QTC Calculation (Bezet): 469 ms
Ventricular Rate: 66 {beats}/min

## 2017-01-27 NOTE — Progress Notes (Signed)
Patient on discharge report dated 01/25/17 from [facility]. Diagnosis: CP. Left message on voicemail to make f/u with PCP & cardio. Will attempt to contact again.  Need to complete post-discharge assessment.

## 2017-01-31 NOTE — Telephone Encounter (Signed)
Patient had a MRI of her lower spine about 2 weeks ago & it showed a possible compression fracture. She has a MRI of her pelvis scheduled tomorrow but expressed concerns about the surgical repair. She states the doctor explained the process but she is still confused & would like PCP's take on everything. She is in pain & using narcotics but fears she may become dependant on them so she tries not to take them. She feels the provider does not have good bedside manner & feels she learned more from me about what a compression fracture is than the doctor himself. She has reached out to Dr.Simpson with [facility] but he is booked for the next 2-3 weeks & she cannot bear this pain that long. She would like PCP's opinion if she should have the procedure or not to repair the compression fracture.

## 2017-01-31 NOTE — Telephone Encounter (Signed)
Dr Pecola Leisurereese thinks she has a fracture in her tailbone and set up a ct of tail bone to evaluate further-he is advising doing a procedure to repair this-she has another ct for tomorrow-advised get this report and let us know what it shows after ct tomorrow and we can help her get a second opinion if needed

## 2017-01-31 NOTE — Telephone Encounter (Signed)
-----   Message from Lynnea FerrierNancy C Pitts sent at 01/31/2017 10:36 AM EDT -----  Regarding: Dr Corliss SkainsGlynn/Phone  Patient is requesting call back to [phone].  She has problems going on with her back and she would like Dr Verlee MonteGlynn's opinion on all of it.  [facility]  He thinks she may have broken her tailbone and wants to do a procedure with a needle and a balloon.  [facility] [place], so she wants to know how to get all of the info to Dr Darl PikesGlynn so she can see it.

## 2017-02-07 ENCOUNTER — Ambulatory Visit
Admit: 2017-02-07 | Discharge: 2017-02-07 | Payer: PRIVATE HEALTH INSURANCE | Attending: Neurology | Primary: Internal Medicine

## 2017-02-07 DIAGNOSIS — G2581 Restless legs syndrome: Secondary | ICD-10-CM

## 2017-02-07 NOTE — Patient Instructions (Signed)
A Healthy Lifestyle: Care Instructions  Your Care Instructions    A healthy lifestyle can help you feel good, stay at a healthy weight, and have plenty of energy for both work and play. A healthy lifestyle is something you can share with your whole family.  A healthy lifestyle also can lower your risk for serious health problems, such as high blood pressure, heart disease, and diabetes.  You can follow a few steps listed below to improve your health and the health of your family.  Follow-up care is a key part of your treatment and safety. Be sure to make and go to all appointments, and call your doctor if you are having problems. It's also a good idea to know your test results and keep a list of the medicines you take.  How can you care for yourself at home?  ?? Do not eat too much sugar, fat, or fast foods. You can still have dessert and treats now and then. The goal is moderation.  ?? Start small to improve your eating habits. Pay attention to portion sizes, drink less juice and soda pop, and eat more fruits and vegetables.  ?? Eat a healthy amount of food. A 3-ounce serving of meat, for example, is about the size of a deck of cards. Fill the rest of your plate with vegetables and whole grains.  ?? Limit the amount of soda and sports drinks you have every day. Drink more water when you are thirsty.  ?? Eat at least 5 servings of fruits and vegetables every day. It may seem like a lot, but it is not hard to reach this goal. A serving or helping is 1 piece of fruit, 1 cup of vegetables, or 2 cups of leafy, raw vegetables. Have an apple or some carrot sticks as an afternoon snack instead of a candy bar. Try to have fruits and/or vegetables at every meal.  ?? Make exercise part of your daily routine. You may want to start with simple activities, such as walking, bicycling, or slow swimming. Try to be active 30 to 60 minutes every day. You do not need to do all 30 to 60  minutes all at once. For example, you can exercise 3 times a day for 10 or 20 minutes. Moderate exercise is safe for most people, but it is always a good idea to talk to your doctor before starting an exercise program.  ?? Keep moving. Mow the lawn, work in the garden, or clean your house. Take the stairs instead of the elevator at work.  ?? If you smoke, quit. People who smoke have an increased risk for heart attack, stroke, cancer, and other lung illnesses. Quitting is hard, but there are ways to boost your chance of quitting tobacco for good.  ?? Use nicotine gum, patches, or lozenges.  ?? Ask your doctor about stop-smoking programs and medicines.  ?? Keep trying.  In addition to reducing your risk of diseases in the future, you will notice some benefits soon after you stop using tobacco. If you have shortness of breath or asthma symptoms, they will likely get better within a few weeks after you quit.  ?? Limit how much alcohol you drink. Moderate amounts of alcohol (up to 2 drinks a day for men, 1 drink a day for women) are okay. But drinking too much can lead to liver problems, high blood pressure, and other health problems.  Family health  If you have a family, there are many things you   can do together to improve your health.  ?? Eat meals together as a family as often as possible.  ?? Eat healthy foods. This includes fruits, vegetables, lean meats and dairy, and whole grains.  ?? Include your family in your fitness plan. Most people think of activities such as jogging or tennis as the way to fitness, but there are many ways you and your family can be more active. Anything that makes you breathe hard and gets your heart pumping is exercise. Here are some tips:  ?? Walk to do errands or to take your child to school or the bus.  ?? Go for a family bike ride after dinner instead of watching TV.  Where can you learn more?  Go to http://www.healthwise.net/[employer].   Enter U807 in the search box to learn more about "A Healthy Lifestyle: Care Instructions."  Current as of: June 10, 2016  Content Version: 11.7  ?? 2006-2018 [employer]. Care instructions adapted under license by [employer] (which disclaims liability or warranty for this information). If you have questions about a medical condition or this instruction, always ask your healthcare professional. [employer], [employer] disclaims any warranty or liability for your use of this information.

## 2017-02-07 NOTE — Progress Notes (Signed)
Ms. Jimmey Ralpharker is here to follow up RLS.

## 2017-02-07 NOTE — Progress Notes (Signed)
Neurology Progress Note    HISTORY PROVIDED BY: patient and daughter    Chief Complaint:   Chief Complaint   Patient presents with   ??? Neurologic Problem      Subjective:   Pt is a 61 y.o. right handed female last seen in clinic on 07/26/16 in f/u for h/o radiographic changes c/w MS, but no clinical symptoms, last MRI brain 03/27/16 was stable. Additionally, has RLS, with an ill-defined irritating sensation in her legs with urge to move her legs beginning around 6PM and keeping her awake and inhibiting her activities, though question if there may be some component of psych issues contributing to her need to walk. Spell of "confusion" reported by pt sounds within normal, especially given multiple stressors. Exam revealed tangential historian, stable right ptosis. Continued Horizant 640m qPM and increased to Requip 2.545mqhs x 1 week, then 62m47mhs.  F/u in clinic in 3 months.    She returns for delayed f/u.  She has a compression fracture of her tailbone, [facility] is not certain if he is going to do a procedure or not.  RLS are improved, if she is at home she just stays up and keeps busy. She may be able to sit for 30 minutes to watch something on TV.  Notes she goes to bed early and wakes up late, states she sure if this is related to depression or not.  She was in the hospital in June with acute hypoxic respiratory failure, unclear cause.  Her husband has had an MI and then a kidney stone.  No vertigo.  +Balance difficulties recently, husband has been helping her to bed in the evenings, she is walking into things at her house.   No diplopia.   Last MRI brain 03/2016 for lip numbness, no acute changes.       Past Medical History:   Diagnosis Date   ??? Abnormal brain scan    ??? Arthritis    ??? Chronic pain    ??? Depression    ??? [place]  ??? Falls frequently    ??? Fracture of left ankle    ??? Headache(784.0) 2011   ??? Ill-defined condition 09/15/2015    FX R HAND    ??? Other and unspecified hyperlipidemia 12/27/2012   ??? PE (pulmonary embolism) 12/2012   ??? RLS (restless legs syndrome) 03/16/2016    Dr MarRoseanne Reno??? TIA (transient ischemic attack) 03/2016    Right facial droop, numbness in lip   ??? Torn rotator cuff     right      Past Surgical History:   Procedure Laterality Date   ??? BREAST SURGERY PROCEDURE UNLISTED  2002    AUGMENTATION   ??? HX APPENDECTOMY     ??? HX CESAREAN SECTION      x4   ??? HX GI      COLONOSCOPY   ??? 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 HX ORTHOPAEDIC  08/2004    rotater cuff r. shoulder   ??? HX ORTHOPAEDIC      left ankle fracture treated with air cast   ??? HX ORTHOPAEDIC  6/13    left shoulder fxt and dislocation   ??? HX OTHER SURGICAL  2010    left foot torn tendon   ??? HX TUBAL LIGATION        Social History     Social History   ???  Marital status: MARRIED     Spouse name: N/A   ??? Number of children: N/A   ??? Years of education: N/A     Occupational History   ??? Not on file.     Social History Main Topics   ??? Smoking status: Never Smoker   ??? Smokeless tobacco: Never Used   ??? Alcohol use No      Comment: Pt denies   ??? Drug use: No   ??? Sexual activity: Yes     Partners: Male     Other Topics Concern   ??? Not on file     Social History Narrative     Family History   Problem Relation Age of Onset   ??? Heart Disease Mother    ??? Heart Disease Father    ??? Cancer Father      thyroid cancer   ??? Arthritis-rheumatoid Sister    ??? No Known Problems Son    ??? No Known Problems Daughter    ??? No Known Problems Daughter    ??? No Known Problems Daughter    ??? Anesth Problems Neg Hx           Objective:   ROS:  Per HPI or PMH o/w neg    No Known Allergies  Meds:  Outpatient Medications Prior to Visit   Medication Sig Dispense Refill   ??? HYDROcodone-acetaminophen (NORCO) 7.5-325 mg per tablet Take 1 Tab by mouth every six (6) hours as needed for Pain. Max Daily Amount: 4 Tabs. 20 Tab 0   ??? pravastatin (PRAVACHOL) 40 mg tablet 1 po every day Please make a med  check appointment in the next month. Thank you. 30 Tab 0   ??? ferrous sulfate 325 mg (65 mg iron) tablet Take 1 Tab by mouth daily (with breakfast). 10 Tab 0   ??? rOPINIRole (REQUIP) 1 mg tablet Take 4 tabs 1-3 hours before bedtime. 120 Tab 2   ??? diazePAM (VALIUM) 5 mg tablet Take 10 mg by mouth nightly. 5 mg every morning and 10 mg QHS  5   ??? gabapentin enacarbil (HORIZANT) 600 mg TbER Take 1 Tab by mouth every evening. 30 Tab 11   ??? aspirin 81 mg chewable tablet Take 1 Tab by mouth daily. 30 Tab 0   ??? ergocalciferol (ERGOCALCIFEROL) 50,000 unit capsule Take 1 Cap by mouth every seven (7) days. 12 Cap 1   ??? escitalopram oxalate (LEXAPRO) 20 mg tablet Take 1 Tab by mouth daily. 30 Tab 5   ??? omeprazole (PRILOSEC) 20 mg capsule Take 20 mg by mouth nightly.     ??? loperamide (IMODIUM) 2 mg capsule Take 1 Cap by mouth four (4) times daily as needed for Diarrhea. 20 Cap 0   ??? rOPINIRole (REQUIP) 1 mg tablet TAKE 2 TABLETS BY MOUTH 1-3 HOURS BEFORE BEDTIME 60 Tab 1   ??? diazePAM (VALIUM) 5 mg tablet Take 5 mg by mouth daily. 5 mg every morning and 10 mg QHS       No facility-administered medications prior to visit.        Imaging:  MRI Results (most recent):    Results from Hospital Encounter encounter on 03/26/16   MRI BRAIN WO CONT   Narrative INDICATION:  TIA (lip numbness, NIH stroke scale of 1)    EXAM: MRI BRAIN WITHOUT CONTRAST.    COMPARISON: CT head 03/26/2016 .     CONTRAST: None administered.     PULSE SEQUENCES: Sagittal T1-weighted images, axial  T1-weighted images, coronal  T1-weighted images , axial FLAIR and T2 star weighted images, axial diffusion  weighted echo planar images, axial ADC mapping.    FINDINGS: Periventricular foci of T2 and FLAIR hyperintensity are numerous and  more prominent than expected for age. Generalized prominence of cerebral sulci  and ventricles is mildly increased, upper normal for age.    The brain parenchyma and ventricular system otherwise are unremarkable in   appearance for age. No parenchymal mass or hemorrhage and no shift of midline  structures. No extra-axial fluid or blood collection. No additional signal  abnormality. The craniocervical junction is normal for age, as are other midline  structures. Large  vessels appear patent on spin-echo imaging. There is no acute  or subacute ischemia on diffusion weighting. .          Impression IMPRESSION:  1. Periventricular foci of T2 and FLAIR hyperintensity which would be consistent  with microvascular ischemic change but is out of proportion to the patient's  age.. Correlate clinically for other potential causes.  2. No acute or subacute ischemia or other acute intracranial abnormality.       CT Results (most recent):    Results from Hospital Encounter encounter on 12/06/16   CTA CHEST W OR W WO CONT   Narrative EXAM:  CTA CHEST W OR W WO CONT    INDICATION:   Acute chest pain, upper back pain, and shortness of breath today    COMPARISON: 03/26/2016.    TECHNIQUE:   Precontrast scout images were obtained to localize the volume for acquisition.  Multislice helical CT arteriography was performed from the diaphragm to the  thoracic inlet during uneventful rapid bolus of 80 cc Isovue-370. Lung and soft  tissue windows were generated.  Coronal and sagittal images were generated and  3D post processing consisting of coronal maximum intensity images was performed.   CT dose reduction was achieved through use of a standardized protocol tailored  for this examination and automatic exposure control for dose modulation.      FINDINGS:  CHEST:  THYROID: No nodule.  MEDIASTINUM: No mass or lymphadenopathy.  HILA: No mass or lymphadenopathy.  THORACIC AORTA: No dissection or aneurysm.  MAIN PULMONARY ARTERY: No evidence of pulmonary embolism.  TRACHEA/BRONCHI: Patent.  ESOPHAGUS: No wall thickening or dilatation.  HEART: Normal in size.  PLEURA: No effusion or pneumothorax.  LUNGS: No nodule, mass, or airspace disease.   INCIDENTALLY IMAGED UPPER ABDOMEN: No focal abnormality.  BONES: Degenerative changes are seen in the thoracic spine.           Impression IMPRESSION: No evidence of pulmonary embolism or acute abnormality.         Reviewed records in [facility] and media tab today  Lab Review   Results for orders placed or performed during the hospital encounter of 01/25/17   CBC WITH AUTOMATED DIFF   Result Value Ref Range    WBC 6.4 3.6 - 11.0 K/uL    RBC 4.02 3.80 - 5.20 M/uL    HGB 9.5 (L) 11.5 - 16.0 g/dL    HCT 30.8 (L) 35.0 - 47.0 %    MCV 76.6 (L) 80.0 - 99.0 FL    MCH 23.6 (L) 26.0 - 34.0 PG    MCHC 30.8 30.0 - 36.5 g/dL    RDW 15.4 (H) 11.5 - 14.5 %    PLATELET 273 150 - 400 K/uL    MPV 9.0 8.9 - 12.9 FL    NRBC 0.0  0 PER 100 WBC    ABSOLUTE NRBC 0.00 0.00 - 0.01 K/uL    NEUTROPHILS 65 32 - 75 %    LYMPHOCYTES 23 12 - 49 %    MONOCYTES 6 5 - 13 %    EOSINOPHILS 5 0 - 7 %    BASOPHILS 1 0 - 1 %    IMMATURE GRANULOCYTES 1 (H) 0.0 - 0.5 %    ABS. NEUTROPHILS 4.2 1.8 - 8.0 K/UL    ABS. LYMPHOCYTES 1.5 0.8 - 3.5 K/UL    ABS. MONOCYTES 0.4 0.0 - 1.0 K/UL    ABS. EOSINOPHILS 0.3 0.0 - 0.4 K/UL    ABS. BASOPHILS 0.0 0.0 - 0.1 K/UL    ABS. IMM. GRANS. 0.0 0.00 - 0.04 K/UL    DF AUTOMATED     METABOLIC PANEL, COMPREHENSIVE   Result Value Ref Range    Sodium 137 136 - 145 mmol/L    Potassium 3.9 3.5 - 5.1 mmol/L    Chloride 103 97 - 108 mmol/L    CO2 28 21 - 32 mmol/L    Anion gap 6 5 - 15 mmol/L    Glucose 124 (H) 65 - 100 mg/dL    BUN 14 6 - 20 MG/DL    Creatinine 0.90 0.55 - 1.02 MG/DL    BUN/Creatinine ratio 16 12 - 20      GFR est AA >60 >60 ml/min/1.89m    GFR est non-AA >60 >60 ml/min/1.763m   Calcium 9.1 8.5 - 10.1 MG/DL    Bilirubin, total 0.2 0.2 - 1.0 MG/DL    ALT (SGPT) 20 12 - 78 U/L    AST (SGOT) 18 15 - 37 U/L    Alk. phosphatase 167 (H) 45 - 117 U/L    Protein, total 7.4 6.4 - 8.2 g/dL    Albumin 3.6 3.5 - 5.0 g/dL    Globulin 3.8 2.0 - 4.0 g/dL    A-G Ratio 0.9 (L) 1.1 - 2.2     TROPONIN I    Result Value Ref Range    Troponin-I, Qt. <0.05 <0.05 ng/mL   LIPASE   Result Value Ref Range    Lipase 210 73 - 393 U/L   POC TROPONIN-I   Result Value Ref Range    Troponin-I (POC) <0.04 0.00 - 0.08 ng/mL   EKG, 12 LEAD, INITIAL   Result Value Ref Range    Ventricular Rate 85 BPM    Atrial Rate 85 BPM    P-R Interval 142 ms    QRS Duration 74 ms    Q-T Interval 390 ms    QTC Calculation (Bezet) 464 ms    Calculated P Axis 29 degrees    Calculated R Axis 20 degrees    Calculated T Axis 58 degrees    Diagnosis       Sinus rhythm with occasional premature ventricular complexes  Low voltage QRS  Nonspecific ST abnormality  Confirmed by WiBerta [phone] 01/25/2017 3:17:07 PM     EKG, 12 LEAD, SUBSEQUENT   Result Value Ref Range    Ventricular Rate 66 BPM    Atrial Rate 66 BPM    P-R Interval 152 ms    QRS Duration 80 ms    Q-T Interval 448 ms    QTC Calculation (Bezet) 469 ms    Calculated P Axis 18 degrees    Calculated R Axis 5 degrees    Calculated T Axis 45 degrees    Diagnosis  Normal sinus rhythm  Low voltage QRS  Confirmed by Berta Minor [phone]) on 01/27/2017 7:06:42 AM          Exam:  Visit Vitals   ??? BP 100/60   ??? Pulse 64   ??? Resp 18   ??? Ht 5' 3" (1.6 m)   ??? Wt 80.1 kg (176 lb 9.6 oz)   ??? SpO2 96%   ??? BMI 31.28 kg/m2     General:  Alert, cooperative, no distress.    Head:  Normocephalic, without obvious abnormality, atraumatic.   Respiratory:  Heart:   Non labored breathing  Regular rate and rhythm, no murmurs       Extremities: Warm, no cyanosis or edema.   Pulses: 2+ radial pulses.       Neurologic:  MS: Alert and oriented x 4, speech intact. Language - inact. Attention and fund of knowledge appropriate.  Recent and remote memory intact.  Cranial Nerves:  II: visual fields Full to confrontation   II: pupils PERRL   II: optic disc    III,VII: ptosis Mild right ptosis - stable   III,IV,VI: extraocular muscles  EOMI, bilateral end gaze rotary nystagmus, no diplopia    V: facial light touch sensation     VII: facial muscle function   symmetric   VIII: hearing intact   IX: soft palate elevation  Palate elevates sym   XI: trapezius strength     XI: sternocleidomastoid strength    XII: tongue  Midline     Motor: normal bulk and tone, no tremor              Strength: 5/5 throughout, no PD    Coordination: FTN and RAM intact, and HTS intact  Gait: normal gait, though slow due to back pain.  Reflexes: 2+ sym             Assessment/Plan   Pt is a 61 y.o. right handed female with h/o radiographic changes c/w MS, but no clinical symptoms, last MRI brain 03/27/16 was stable. Now with c/o worsened balance. Additionally, has RLS, with an ill-defined irritating sensation in her legs with urge to move her legs beginning around 6PM and keeping her awake and inhibiting her activities, though question if there may be some component of psych issues contributing to her need to walk. Exam reveals bilateral rotary nystagmus worse at end gaze, stable right ptosis, o/w unremarkable. Given new rotary nystagmus and complaints of imbalance, recommend MRI brain w/wo contrast to assess for new demyelinating lesion, stroke, or mass in brainstem/posterior fossa. Continue Horizant 673m qPM and increased to Requip 449mqhs.  Will call pt after MRI completed. F/u in clinic in 3 months, instructed to call in the interim if needed.     ICD-10-CM ICD-9-CM    1. RLS (restless legs syndrome) G25.81 333.94    2. Abnormal brain scan R94.02 794.09 MRI BRAIN W WO CONT   3. Rotary nystagmus H55.09 379.56 MRI BRAIN W WO CONT       Signed:  MaChauncey ReadingMD  02/07/2017

## 2017-02-09 NOTE — Telephone Encounter (Signed)
-----   Message from ElyriaShequita M Chavis sent at 02/09/2017  3:06 PM EDT -----  Regarding: Dr. Darl PikesGlynn/ telephone  Pt calling about back issues. Requesting a call back from the doctor. Best contact [phone].

## 2017-02-11 NOTE — Telephone Encounter (Signed)
Dr. Ransom, do you want to do peer to peer?

## 2017-02-11 NOTE — Telephone Encounter (Signed)
-----   Message from LuptonShaquan Houchens sent at 02/11/2017  9:21 AM EDT -----  Regarding: Dr. Alison Murrayansom/ telephone  Robyne PeersSamantha O[facility], called because a peer to peer is needed for the pt's MRI. This can be done by calling [phone].      [name]'s best contact number is [phone]

## 2017-02-11 NOTE — Telephone Encounter (Signed)
Joe - Can you please call and ask why a peer to peer is needed? The pt has h/o demyelinating lesions on MRI in the past and now has new neurological deficits.

## 2017-02-12 MED ORDER — ROPINIROLE 1 MG TAB
1 mg | ORAL_TABLET | ORAL | 5 refills | Status: DC
Start: 2017-02-12 — End: 2017-08-08

## 2017-02-14 NOTE — Telephone Encounter (Signed)
Called for peer-to-peer: Spoke to Dr. Cherly Hensenhang and basically read my note to her and she approved the MRI brain.  Unclear why a peer to peer was needed.     [id number] valid until 03/12/17

## 2017-02-14 NOTE — Telephone Encounter (Signed)
Left message for [name] to call back.

## 2017-02-14 NOTE — Telephone Encounter (Signed)
-----   Message from Ashok Palliffany N Robinson sent at 02/14/2017  1:56 PM EDT -----  Regarding: Dr. Junius Creameransom/Telephone  [name] is returning call back to Joe. She stated pt will indeed  need a peer to peer for mri,brain. [phone] number for  peer to peer. Due close tomorrow aug 14.

## 2017-02-14 NOTE — Telephone Encounter (Signed)
Requested Prescriptions     Signed Prescriptions Disp Refills   ??? rOPINIRole (REQUIP) 1 mg tablet 120 Tab 5     Sig: TAKE 4 TABLETS BY MOUTH 1 TO 3 HOURS BEFORE BEDTIME EVERYDAY     Authorizing Provider: Jodi MourningANSOM, MARY M

## 2017-02-14 NOTE — Telephone Encounter (Signed)
Peer to peer? Thanks

## 2017-02-14 NOTE — Telephone Encounter (Signed)
Informed Lindsay [name], this was approved.

## 2017-02-15 NOTE — Telephone Encounter (Signed)
Pt would like a call back on her cell. She stated Moshe CiproBritt was to call her back and still hasnt heard heard back from her

## 2017-02-15 NOTE — Telephone Encounter (Signed)
I see that Brit forwarded the message from 8-8 to you. Did you review? Are you aware of what this is about?

## 2017-02-16 ENCOUNTER — Encounter

## 2017-02-16 NOTE — Telephone Encounter (Signed)
I called and left her a message-have tried twice before and not been ablt to reach her-advised appt here if needs help also advised leave message with nurse with specifics of how I can help

## 2017-02-17 ENCOUNTER — Inpatient Hospital Stay: Admit: 2017-02-17 | Payer: BLUE CROSS/BLUE SHIELD | Attending: Neurology | Primary: Internal Medicine

## 2017-02-17 DIAGNOSIS — R9402 Abnormal brain scan: Secondary | ICD-10-CM

## 2017-02-17 MED ORDER — GADOTERATE MEGLUMINE 0.5 MMOL/ML IV SOLUTION
0.5 mmol/mL (376.9 mg/mL) | Freq: Once | INTRAVENOUS | Status: AC
Start: 2017-02-17 — End: 2017-02-17
  Administered 2017-02-17: 17:00:00 via INTRAVENOUS

## 2017-02-18 NOTE — Telephone Encounter (Signed)
Informed patient MRI stable. She expresses understanding.

## 2017-02-18 NOTE — Telephone Encounter (Signed)
Patient called to inform Dr. Alison Murray that she had her MRI done.

## 2017-02-18 NOTE — Telephone Encounter (Signed)
Joe - Please call pt: MRI brain is stable, no explanation for sxs.

## 2017-02-18 NOTE — Telephone Encounter (Signed)
Message from [employer]    Returning your call

## 2017-02-21 ENCOUNTER — Encounter: Attending: Internal Medicine | Primary: Internal Medicine

## 2017-02-24 ENCOUNTER — Inpatient Hospital Stay: Admit: 2017-02-24 | Payer: BLUE CROSS/BLUE SHIELD | Attending: Internal Medicine | Primary: Internal Medicine

## 2017-02-24 ENCOUNTER — Inpatient Hospital Stay: Admit: 2017-02-24 | Payer: BLUE CROSS/BLUE SHIELD | Attending: Orthopaedic Surgery | Primary: Internal Medicine

## 2017-02-24 ENCOUNTER — Encounter

## 2017-02-24 DIAGNOSIS — Z1239 Encounter for other screening for malignant neoplasm of breast: Secondary | ICD-10-CM

## 2017-02-24 DIAGNOSIS — Z1231 Encounter for screening mammogram for malignant neoplasm of breast: Secondary | ICD-10-CM

## 2017-02-25 NOTE — Progress Notes (Signed)
Notified by letter.

## 2017-03-03 NOTE — Telephone Encounter (Signed)
Pt had MRI about a week and half ago. Last night pt turned off the lights and she was seeing little specs of light repeatedly. It was so bad she just went to bed. Pt had headache during it or right after.     This morning pt is doing okay.

## 2017-03-03 NOTE — Telephone Encounter (Signed)
Dr Alison Murrayansom out, please advise.

## 2017-03-04 ENCOUNTER — Encounter: Attending: Internal Medicine | Primary: Internal Medicine

## 2017-03-04 NOTE — Telephone Encounter (Signed)
I am not sure what this is from. Her recent MRI shows chronic, age-related findings and not contrast enhancement. This symptom is more likely ophthalmologic than neurologic. Please suggest seeking opinion from an eye specialist and to notify us if symptoms recur

## 2017-03-04 NOTE — Telephone Encounter (Signed)
Spoke with patient, informed her per Dr.Saaraswat-Her recent MRI shows chronic, age-related findings and not contrast enhancement. This symptom is more likely ophthalmologic than neurologic. Please suggest seeking opinion from an eye specialist and to notify us if symptoms recur    She verbalized understanding. States flashes of light were happening to left eye when looking up.

## 2017-03-18 ENCOUNTER — Ambulatory Visit
Admit: 2017-03-18 | Discharge: 2017-03-18 | Payer: PRIVATE HEALTH INSURANCE | Attending: Internal Medicine | Primary: Internal Medicine

## 2017-03-18 DIAGNOSIS — M791 Myalgia, unspecified site: Secondary | ICD-10-CM

## 2017-03-18 NOTE — Progress Notes (Signed)
HISTORY OF PRESENT ILLNESS  Lindsay [name] is a 61 y.o. female.  HPI  Lindsay [name] is seen for med check.  She is generally not feeling well. Issues:  1. This morning woke with muscle aches bilaterally in thighs.  She is not diffusely having muscle pain prior to today. She's on Pravastatin.  She's not had fevers or chills.  2. Dyspnea on exertion, which has been present for months. She had a negative CTA in June of this year.  She's had normal chest xray.  She's not having productive cough.  Worried about COPD. No history of smoking.  Will check PFTs.  3. History of fracture of pelvis.  Did see Dr. Lodema Hong, feels her back pain is gradually improving.  Continues to have neurologic symptoms.  Neuro exam and brain MRI done this summer were okay and has seen Dr. Alison Murray.  Continues to gain weight and unhappy with her body image with this.  Continues on medicines for restless legs.      Review of Systems   Constitutional: Positive for malaise/fatigue. Negative for chills, diaphoresis, fever and weight loss.   HENT: Negative for congestion, ear discharge, ear pain, nosebleeds and sore throat.    Eyes: Negative for pain and discharge.   Respiratory: Positive for shortness of breath. Negative for cough, hemoptysis, sputum production and wheezing.    Cardiovascular: Negative for chest pain, palpitations, orthopnea, leg swelling and PND.   Gastrointestinal: Negative for heartburn and nausea.   Musculoskeletal: Positive for myalgias.   Skin: Negative for rash.   Neurological: Negative for dizziness and headaches.   Psychiatric/Behavioral: Positive for depression. The patient has insomnia.        Physical Exam   Constitutional: She is oriented to person, place, and time. She appears well-developed and well-nourished.   HENT:   Head: Normocephalic and atraumatic.   Right Ear: Tympanic membrane, external ear and ear canal normal.   Left Ear: Tympanic membrane, external ear and ear canal normal.   Nose: Nose normal.    Mouth/Throat: Oropharynx is clear and moist and mucous membranes are normal. No oropharyngeal exudate.   Eyes: Conjunctivae are normal. Pupils are equal, round, and reactive to light. Right eye exhibits no discharge. Left eye exhibits no discharge.   Neck: Normal range of motion. Neck supple. Carotid bruit is not present. No thyromegaly present.   Cardiovascular: Normal rate, regular rhythm, S1 normal, S2 normal, normal heart sounds and intact distal pulses.    No murmur heard.  Pulmonary/Chest: Effort normal and breath sounds normal. No respiratory distress. She has no wheezes. She has no rales.   Abdominal: Soft. Bowel sounds are normal. She exhibits no distension and no mass. There is no tenderness.   Musculoskeletal: She exhibits no edema.   Lymphadenopathy:     She has no cervical adenopathy.   Neurological: She is alert and oriented to person, place, and time.   Psychiatric: She has a normal mood and affect. Her behavior is normal.   Nursing note and vitals reviewed.      ASSESSMENT and PLAN  Diagnoses and all orders for this visit:    1. Muscle pain-just started she is on a statin so check ck  If ok then would give it a few weeks to see trend of pain  -     CK    2. DOE (dyspnea on exertion)-may be from deconditioning  Recent cxr and cta ok if pft normal consider cardiology testing  -     PULMONARY  FUNCTION TEST; Future    3. Other fatigue  -     CBC WITH AUTOMATED DIFF  -     TSH 3RD GENERATION    4. Dyslipidemia, goal LDL below 100  -     METABOLIC PANEL, COMPREHENSIVE  -     LIPID PANEL    5. RLS (restless legs syndrome)    6. Anxiety and depression    7. Dysuria  -     CULTURE, URINE

## 2017-03-20 LAB — CULTURE, URINE

## 2017-03-21 LAB — METABOLIC PANEL, COMPREHENSIVE
A-G Ratio: 1.7 (ref 1.2–2.2)
ALT (SGPT): 10 IU/L (ref 0–32)
AST (SGOT): 15 IU/L (ref 0–40)
Albumin: 4.3 g/dL (ref 3.6–4.8)
Alk. phosphatase: 173 IU/L — ABNORMAL HIGH (ref 39–117)
BUN/Creatinine ratio: 16 (ref 12–28)
BUN: 11 mg/dL (ref 8–27)
Bilirubin, total: <0.2 mg/dL (ref 0.0–1.2)
CO2: 22 mmol/L (ref 20–29)
Calcium: 9.2 mg/dL (ref 8.7–10.3)
Chloride: 100 mmol/L (ref 96–106)
Creatinine: 0.7 mg/dL (ref 0.57–1.00)
GFR est AA: 107 mL/min/{1.73_m2} (ref >59)
GFR est non-AA: 93 mL/min/{1.73_m2} (ref >59)
GLOBULIN, TOTAL: 2.6 g/dL (ref 1.5–4.5)
Glucose: 105 mg/dL — ABNORMAL HIGH (ref 65–99)
Potassium: 4.5 mmol/L (ref 3.5–5.2)
Protein, total: 6.9 g/dL (ref 6.0–8.5)
Sodium: 138 mmol/L (ref 134–144)

## 2017-03-21 LAB — CBC WITH AUTOMATED DIFF
ABS. BASOPHILS: 0 10*3/uL (ref 0.0–0.2)
ABS. EOSINOPHILS: 0.2 10*3/uL (ref 0.0–0.4)
ABS. IMM. GRANS.: 0 10*3/uL (ref 0.0–0.1)
ABS. MONOCYTES: 0.4 10*3/uL (ref 0.1–0.9)
ABS. NEUTROPHILS: 3.6 10*3/uL (ref 1.4–7.0)
Abs Lymphocytes: 2 10*3/uL (ref 0.7–3.1)
BASOPHILS: 0 %
EOSINOPHILS: 3 %
HCT: 29.8 % — ABNORMAL LOW (ref 34.0–46.6)
HGB: 9.7 g/dL — ABNORMAL LOW (ref 11.1–15.9)
IMMATURE GRANULOCYTES: 0 %
Lymphocytes: 32 %
MCH: 23.7 pg — ABNORMAL LOW (ref 26.6–33.0)
MCHC: 32.6 g/dL (ref 31.5–35.7)
MCV: 73 fL — ABNORMAL LOW (ref 79–97)
MONOCYTES: 7 %
NEUTROPHILS: 58 %
PLATELET: 278 10*3/uL (ref 150–379)
RBC: 4.09 x10E6/uL (ref 3.77–5.28)
RDW: 15.8 % — ABNORMAL HIGH (ref 12.3–15.4)
WBC: 6.2 10*3/uL (ref 3.4–10.8)

## 2017-03-21 LAB — CK: Creatine Kinase,Total: 109 U/L (ref 24–173)

## 2017-03-21 LAB — TSH 3RD GENERATION: TSH: 2.22 u[IU]/mL (ref 0.450–4.500)

## 2017-03-21 NOTE — Telephone Encounter (Signed)
I left a message that her cbc still shows anemia and I think this is adding to her doe-needs gi evaluation asap to make sure no active gi blood loss-iron studies in spring showed iron deficiency  We will make appt with gi in [place]  Dr Mort Sawyersfred duckworth or partner is good

## 2017-03-21 NOTE — Progress Notes (Signed)
Notified by letter.

## 2017-03-22 NOTE — Telephone Encounter (Signed)
Patient received PCP's v/m about needing a gi evaluation. Patient has been scheduled to see Dr. Lorine BearsAlex Seamon [facility] on Sept 27th at 10:30 but advised to arrive at 10:00 for check-in. Patient notified of this & voiced understanding.

## 2017-03-23 NOTE — Progress Notes (Signed)
Refer to gi for anemia other labs ok-pt aware

## 2017-03-28 ENCOUNTER — Emergency Department: Admit: 2017-03-28 | Payer: BLUE CROSS/BLUE SHIELD | Primary: Internal Medicine

## 2017-03-28 ENCOUNTER — Inpatient Hospital Stay
Admit: 2017-03-28 | Discharge: 2017-03-28 | Disposition: A | Discharge status: Home / Self Care | Payer: BLUE CROSS/BLUE SHIELD | Attending: Emergency Medicine

## 2017-03-28 ENCOUNTER — Inpatient Hospital Stay: Admit: 2017-03-28 | Payer: BLUE CROSS/BLUE SHIELD | Attending: Internal Medicine | Primary: Internal Medicine

## 2017-03-28 ENCOUNTER — Emergency Department

## 2017-03-28 DIAGNOSIS — M549 Dorsalgia, unspecified: Secondary | ICD-10-CM

## 2017-03-28 DIAGNOSIS — R0609 Other forms of dyspnea: Secondary | ICD-10-CM

## 2017-03-28 LAB — EKG 12-LEAD
Atrial Rate: 71 {beats}/min
Diagnosis: NORMAL
P Axis: 22 degrees
P-R Interval: 112 ms
Q-T Interval: 440 ms
QRS Duration: 82 ms
QTc Calculation (Bazett): 478 ms
R Axis: 16 degrees
T Axis: 43 degrees
Ventricular Rate: 71 {beats}/min

## 2017-03-28 LAB — D-DIMER, QUANTITATIVE: D-Dimer, Quant: 2.05 mg/L FEU — ABNORMAL HIGH (ref 0.00–0.65)

## 2017-03-28 LAB — URINALYSIS W/ REFLEX CULTURE
Bacteria: NEGATIVE /hpf
Bilirubin: NEGATIVE
Blood: NEGATIVE
Glucose: NEGATIVE mg/dL
Ketone: NEGATIVE mg/dL
Leukocyte Esterase: NEGATIVE
Nitrites: NEGATIVE
Protein: NEGATIVE mg/dL
Specific gravity: 1.01 (ref 1.003–1.030)
Urobilinogen: 0.2 EU/dL (ref 0.2–1.0)
pH (UA): 6.5 (ref 5.0–8.0)

## 2017-03-28 LAB — METABOLIC PANEL, COMPREHENSIVE
A-G Ratio: 1 — ABNORMAL LOW (ref 1.1–2.2)
ALT (SGPT): 16 U/L (ref 12–78)
AST (SGOT): 13 U/L — ABNORMAL LOW (ref 15–37)
Albumin: 3.6 g/dL (ref 3.5–5.0)
Alk. phosphatase: 187 U/L — ABNORMAL HIGH (ref 45–117)
Anion gap: 6 mmol/L (ref 5–15)
BUN/Creatinine ratio: 15 (ref 12–20)
BUN: 11 MG/DL (ref 6–20)
Bilirubin, total: 0.2 MG/DL (ref 0.2–1.0)
CO2: 29 mmol/L (ref 21–32)
Calcium: 8.7 MG/DL (ref 8.5–10.1)
Chloride: 103 mmol/L (ref 97–108)
Creatinine: 0.73 MG/DL (ref 0.55–1.02)
GFR est AA: >60 mL/min/{1.73_m2} (ref >60)
GFR est non-AA: >60 mL/min/{1.73_m2} (ref >60)
Globulin: 3.6 g/dL (ref 2.0–4.0)
Glucose: 99 mg/dL (ref 65–100)
Potassium: 4.2 mmol/L (ref 3.5–5.1)
Protein, total: 7.2 g/dL (ref 6.4–8.2)
Sodium: 138 mmol/L (ref 136–145)

## 2017-03-28 LAB — CBC WITH AUTOMATED DIFF
ABS. BASOPHILS: 0 10*3/uL (ref 0.0–0.1)
ABS. EOSINOPHILS: 0.2 10*3/uL (ref 0.0–0.4)
ABS. IMM. GRANS.: 0 10*3/uL (ref 0.00–0.04)
ABS. LYMPHOCYTES: 1.8 10*3/uL (ref 0.8–3.5)
ABS. MONOCYTES: 0.4 10*3/uL (ref 0.0–1.0)
ABS. NEUTROPHILS: 3.8 10*3/uL (ref 1.8–8.0)
ABSOLUTE NRBC: 0.02 10*3/uL — ABNORMAL HIGH (ref 0.00–0.01)
BASOPHILS: 1 % (ref 0–1)
EOSINOPHILS: 3 % (ref 0–7)
HCT: 29.8 % — ABNORMAL LOW (ref 35.0–47.0)
HGB: 9.2 g/dL — ABNORMAL LOW (ref 11.5–16.0)
IMMATURE GRANULOCYTES: 1 % — ABNORMAL HIGH (ref 0.0–0.5)
LYMPHOCYTES: 29 % (ref 12–49)
MCH: 23.5 PG — ABNORMAL LOW (ref 26.0–34.0)
MCHC: 30.9 g/dL (ref 30.0–36.5)
MCV: 76 FL — ABNORMAL LOW (ref 80.0–99.0)
MONOCYTES: 6 % (ref 5–13)
MPV: 8.4 FL — ABNORMAL LOW (ref 8.9–12.9)
NEUTROPHILS: 61 % (ref 32–75)
NRBC: 0.3 PER 100 WBC — ABNORMAL HIGH
PLATELET: 257 10*3/uL (ref 150–400)
RBC: 3.92 M/uL (ref 3.80–5.20)
RDW: 15.5 % — ABNORMAL HIGH (ref 11.5–14.5)
WBC: 6.3 10*3/uL (ref 3.6–11.0)

## 2017-03-28 LAB — EKG, 12 LEAD, INITIAL
Atrial Rate: 71 {beats}/min
Calculated P Axis: 22 degrees
Calculated R Axis: 16 degrees
Calculated T Axis: 43 degrees
Diagnosis: NORMAL
P-R Interval: 112 ms
Q-T Interval: 440 ms
QRS Duration: 82 ms
QTC Calculation (Bezet): 478 ms
Ventricular Rate: 71 {beats}/min

## 2017-03-28 LAB — TROPONIN I: Troponin-I, Qt.: <0.05 ng/mL (ref <0.05)

## 2017-03-28 LAB — D DIMER: D-dimer: 2.05 mg/L FEU — ABNORMAL HIGH (ref 0.00–0.65)

## 2017-03-28 LAB — CK W/ REFLX CKMB: CK: 123 U/L (ref 26–192)

## 2017-03-28 MED ORDER — SODIUM CHLORIDE 0.9 % IV
Freq: Once | INTRAVENOUS | Status: AC
Start: 2017-03-28 — End: 2017-03-28
  Administered 2017-03-28: 18:00:00 via INTRAVENOUS

## 2017-03-28 MED ORDER — HYDROCODONE-ACETAMINOPHEN 5 MG-325 MG TAB
5-325 mg | ORAL_TABLET | ORAL | 0 refills | Status: DC | PRN
Start: 2017-03-28 — End: 2017-04-10

## 2017-03-28 MED ORDER — MORPHINE 2 MG/ML INJECTION
2 mg/mL | INTRAMUSCULAR | Status: AC
Start: 2017-03-28 — End: 2017-03-28
  Administered 2017-03-28: 17:00:00 via INTRAVENOUS

## 2017-03-28 MED ORDER — ONDANSETRON (PF) 4 MG/2 ML INJECTION
4 mg/2 mL | INTRAMUSCULAR | Status: AC
Start: 2017-03-28 — End: 2017-03-28
  Administered 2017-03-28: 17:00:00 via INTRAVENOUS

## 2017-03-28 MED ORDER — SODIUM CHLORIDE 0.9 % IJ SYRG
Freq: Once | INTRAMUSCULAR | Status: AC
Start: 2017-03-28 — End: 2017-03-28
  Administered 2017-03-28: 18:00:00 via INTRAVENOUS

## 2017-03-28 MED ORDER — IOPAMIDOL 76 % IV SOLN
370 mg iodine /mL (76 %) | Freq: Once | INTRAVENOUS | Status: AC
Start: 2017-03-28 — End: 2017-03-28
  Administered 2017-03-28: 18:00:00 via INTRAVENOUS

## 2017-03-28 MED ORDER — SODIUM CHLORIDE 0.9% BOLUS IV
0.9 % | Freq: Once | INTRAVENOUS | Status: AC
Start: 2017-03-28 — End: 2017-03-28
  Administered 2017-03-28: 17:00:00 via INTRAVENOUS

## 2017-03-28 MED FILL — MORPHINE 2 MG/ML INJECTION: 2 mg/mL | INTRAMUSCULAR | Qty: 1

## 2017-03-28 MED FILL — SODIUM CHLORIDE 0.9 % IV: INTRAVENOUS | Qty: 1000

## 2017-03-28 MED FILL — ISOVUE-370  76 % INTRAVENOUS SOLUTION: 370 mg iodine /mL (76 %) | INTRAVENOUS | Qty: 100

## 2017-03-28 MED FILL — ONDANSETRON (PF) 4 MG/2 ML INJECTION: 4 mg/2 mL | INTRAMUSCULAR | Qty: 2

## 2017-03-28 NOTE — Procedures (Signed)
[facility]  PULMONARY FUNCTION    Name:Lindsay [name], Lindsay A.  MR#: [id number]  DOB: Dec 26, 1955  ACCOUNT #: [id number]   DATE OF SERVICE: 03/28/2017    Amended document - incorrectly imported as 2D echo; corrected as PFT 06/06/17    REASON FOR TEST:  Cough.    Spirometry and lung volumes were performed and they reveal:  1.  No airflow obstruction.  2.  No restrictive lung disease.  3.  Moderate reduction in DLCO.  4.  Normal flow volume loop.        Vickki Muff, MD       ERG / TN  D: 04/08/2017 14:05     T: 04/08/2017 19:07  JOB #: [id number]  CC: Golda Acre MD

## 2017-03-28 NOTE — ED Notes (Addendum)
Pt to CT

## 2017-03-28 NOTE — ED Provider Notes (Signed)
EMERGENCY DEPARTMENT HISTORY AND PHYSICAL EXAM      Date: 03/28/2017  Patient Name: Lindsay [name]    History of Presenting Illness     Chief Complaint   Patient presents with   ??? Back Pain     c/o upper back pain with shortbress of breath; sent by PCP for r/o PE; hx of PE       History Provided By: Patient    HPI: Lindsay [name], 61 y.o. female with PMHx significant for PE / DVT / TIA / arthritis / RLS / depression, presents ambulatory to the ED with cc of upper back pain with associated SOB since 0300 today. Pt spoke to her PCP about her sxs and was referred to the ED given her hx significant for PE. Pt discloses a hx of similar sxs with PE. Pt denies hx of kidney stones. Pt denies being on blood thinners. Pt specifically denies HA, CP, fever, chills, abdominal pain, nausea, vomiting, diarrhea, or urinary sxs.      There are no other complaints, changes, or physical findings at this time.    PCP: Flora Lipps, MD    Current Outpatient Prescriptions   Medication Sig Dispense Refill   ??? HYDROcodone-acetaminophen (NORCO) 5-325 mg per tablet Take 1 Tab by mouth every four (4) hours as needed for Pain. Max Daily Amount: 6 Tabs. 15 Tab 0   ??? rOPINIRole (REQUIP) 1 mg tablet TAKE 4 TABLETS BY MOUTH 1 TO 3 HOURS BEFORE BEDTIME EVERYDAY 120 Tab 5   ??? pravastatin (PRAVACHOL) 40 mg tablet 1 po every day Please make a med check appointment in the next month. Thank you. 30 Tab 0   ??? ferrous sulfate 325 mg (65 mg iron) tablet Take 1 Tab by mouth daily (with breakfast). 10 Tab 0   ??? diazePAM (VALIUM) 5 mg tablet Take 10 mg by mouth nightly. 5 mg every morning and 10 mg QHS  5   ??? gabapentin enacarbil (HORIZANT) 600 mg TbER Take 1 Tab by mouth every evening. 30 Tab 11   ??? aspirin 81 mg chewable tablet Take 1 Tab by mouth daily. 30 Tab 0   ??? ergocalciferol (ERGOCALCIFEROL) 50,000 unit capsule Take 1 Cap by mouth every seven (7) days. 12 Cap 1   ??? escitalopram oxalate (LEXAPRO) 20 mg tablet Take 1 Tab by mouth daily. 30 Tab 5    ??? omeprazole (PRILOSEC) 20 mg capsule Take 20 mg by mouth nightly.         Past History     Past Medical History:  Past Medical History:   Diagnosis Date   ??? Abnormal brain scan    ??? Arthritis    ??? Chronic pain    ??? Depression    ??? DVT (deep venous thrombosis) ([place])    ??? Falls frequently    ??? Fracture of left ankle    ??? Headache(784.0) 2011   ??? Ill-defined condition 09/15/2015    FX R HAND   ??? Other and unspecified hyperlipidemia 12/27/2012   ??? PE (pulmonary embolism) 12/2012   ??? RLS (restless legs syndrome) 03/16/2016    Dr Roseanne Reno   ??? TIA (transient ischemic attack) 03/2016    Right facial droop, numbness in lip   ??? Torn rotator cuff     right       Past Surgical History:  Past Surgical History:   Procedure Laterality Date   ??? BREAST SURGERY PROCEDURE UNLISTED  2002    AUGMENTATION   ???  HX APPENDECTOMY     ??? HX CESAREAN SECTION      x4   ??? HX GI      COLONOSCOPY   ??? 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 HX ORTHOPAEDIC  08/2004    rotater cuff r. shoulder   ??? HX ORTHOPAEDIC      left ankle fracture treated with air cast   ??? HX ORTHOPAEDIC  6/13    left shoulder fxt and dislocation   ??? HX OTHER SURGICAL  2010    left foot torn tendon   ??? HX TUBAL LIGATION     ??? IMPLANT BREAST SILICONE/EQ  7124    Bilateral breast implants       Family History:  Family History   Problem Relation Age of Onset   ??? Heart Disease Mother    ??? Heart Disease Father    ??? Cancer Father      thyroid cancer   ??? Arthritis-rheumatoid Sister    ??? No Known Problems Son    ??? No Known Problems Daughter    ??? No Known Problems Daughter    ??? No Known Problems Daughter    ??? Anesth Problems Neg Hx        Social History:  Social History   Substance Use Topics   ??? Smoking status: Never Smoker   ??? Smokeless tobacco: Never Used   ??? Alcohol use No      Comment: Pt denies       Allergies:  No Known Allergies      Review of Systems   Review of Systems   Constitutional: Negative.  Negative for chills and fever.    HENT: Negative.  Negative for congestion and rhinorrhea.    Respiratory: Positive for shortness of breath. Negative for cough, chest tightness and wheezing.    Cardiovascular: Negative.  Negative for chest pain and palpitations.   Gastrointestinal: Negative.  Negative for abdominal pain, constipation, nausea and vomiting.   Endocrine: Negative.    Genitourinary: Negative.  Negative for decreased urine volume, flank pain, hematuria and pelvic pain.   Musculoskeletal: Positive for back pain. Negative for neck pain.   Skin: Negative.  Negative for color change, pallor and rash.   Neurological: Negative.  Negative for dizziness, seizures, weakness, numbness and headaches.   Hematological: Negative.  Negative for adenopathy.   Psychiatric/Behavioral: Negative.    All other systems reviewed and are negative.      Physical Exam   Physical Exam   Constitutional: She is oriented to person, place, and time. She appears well-developed and well-nourished. No distress.   HENT:   Head: Normocephalic and atraumatic.   Mouth/Throat: No oropharyngeal exudate.   Eyes: Conjunctivae are normal. Pupils are equal, round, and reactive to light. Right eye exhibits no discharge. Left eye exhibits no discharge. No scleral icterus.   Neck: Normal range of motion. Neck supple. No JVD present.   Cardiovascular: Normal rate, regular rhythm, normal heart sounds and intact distal pulses.  Exam reveals no gallop and no friction rub.    No murmur heard.  Pulmonary/Chest: Effort normal and breath sounds normal. No accessory muscle usage or stridor. Tachypnea noted. No respiratory distress. She has no wheezes. She has no rales.         She exhibits no tenderness.   Pain right subscapular, no rash redness warmth or induration   Abdominal: Soft. Bowel sounds are normal. She exhibits no distension and no mass. There is no tenderness. There is  no rebound and no guarding.   Neurological: She is alert and oriented to person, place, and time. She  displays normal reflexes. No cranial nerve deficit. She exhibits normal muscle tone. Coordination normal.   Skin: Skin is warm. No rash noted. She is not diaphoretic. No pallor.   Vitals reviewed.      Diagnostic Study Results     Labs -     Recent Results (from the past 12 hour(s))   EKG, 12 LEAD, INITIAL    Collection Time: 03/28/17 10:25 AM   Result Value Ref Range    Ventricular Rate 71 BPM    Atrial Rate 71 BPM    P-R Interval 112 ms    QRS Duration 82 ms    Q-T Interval 440 ms    QTC Calculation (Bezet) 478 ms    Calculated P Axis 22 degrees    Calculated R Axis 16 degrees    Calculated T Axis 43 degrees    Diagnosis       Normal sinus rhythm  Low voltage QRS  When compared with ECG of 25-Jan-2017 16:15,  No significant change was found  Confirmed by Ravindra, P.V. ([id number]) on 03/28/2017 0:93:23 PM     METABOLIC PANEL, COMPREHENSIVE    Collection Time: 03/28/17 11:12 AM   Result Value Ref Range    Sodium 138 136 - 145 mmol/L    Potassium 4.2 3.5 - 5.1 mmol/L    Chloride 103 97 - 108 mmol/L    CO2 29 21 - 32 mmol/L    Anion gap 6 5 - 15 mmol/L    Glucose 99 65 - 100 mg/dL    BUN 11 6 - 20 MG/DL    Creatinine 0.73 0.55 - 1.02 MG/DL    BUN/Creatinine ratio 15 12 - 20      GFR est AA >60 >60 ml/min/1.74m    GFR est non-AA >60 >60 ml/min/1.722m   Calcium 8.7 8.5 - 10.1 MG/DL    Bilirubin, total 0.2 0.2 - 1.0 MG/DL    ALT (SGPT) 16 12 - 78 U/L    AST (SGOT) 13 (L) 15 - 37 U/L    Alk. phosphatase 187 (H) 45 - 117 U/L    Protein, total 7.2 6.4 - 8.2 g/dL    Albumin 3.6 3.5 - 5.0 g/dL    Globulin 3.6 2.0 - 4.0 g/dL    A-G Ratio 1.0 (L) 1.1 - 2.2     CBC WITH AUTOMATED DIFF    Collection Time: 03/28/17 11:12 AM   Result Value Ref Range    WBC 6.3 3.6 - 11.0 K/uL    RBC 3.92 3.80 - 5.20 M/uL    HGB 9.2 (L) 11.5 - 16.0 g/dL    HCT 29.8 (L) 35.0 - 47.0 %    MCV 76.0 (L) 80.0 - 99.0 FL    MCH 23.5 (L) 26.0 - 34.0 PG    MCHC 30.9 30.0 - 36.5 g/dL    RDW 15.5 (H) 11.5 - 14.5 %    PLATELET 257 150 - 400 K/uL     MPV 8.4 (L) 8.9 - 12.9 FL    NRBC 0.3 (H) 0 PER 100 WBC    ABSOLUTE NRBC 0.02 (H) 0.00 - 0.01 K/uL    NEUTROPHILS 61 32 - 75 %    LYMPHOCYTES 29 12 - 49 %    MONOCYTES 6 5 - 13 %    EOSINOPHILS 3 0 - 7 %    BASOPHILS 1 0 -  1 %    IMMATURE GRANULOCYTES 1 (H) 0.0 - 0.5 %    ABS. NEUTROPHILS 3.8 1.8 - 8.0 K/UL    ABS. LYMPHOCYTES 1.8 0.8 - 3.5 K/UL    ABS. MONOCYTES 0.4 0.0 - 1.0 K/UL    ABS. EOSINOPHILS 0.2 0.0 - 0.4 K/UL    ABS. BASOPHILS 0.0 0.0 - 0.1 K/UL    ABS. IMM. GRANS. 0.0 0.00 - 0.04 K/UL    DF AUTOMATED     TROPONIN I    Collection Time: 03/28/17 11:12 AM   Result Value Ref Range    Troponin-I, Qt. <0.05 <0.05 ng/mL   CK W/ REFLX CKMB    Collection Time: 03/28/17 11:12 AM   Result Value Ref Range    CK 123 26 - 192 U/L   D DIMER    Collection Time: 03/28/17 11:13 AM   Result Value Ref Range    D-dimer 2.05 (H) 0.00 - 0.65 mg/L FEU   URINALYSIS W/ REFLEX CULTURE    Collection Time: 03/28/17  1:14 PM   Result Value Ref Range    Color YELLOW/STRAW      Appearance CLEAR CLEAR      Specific gravity 1.010 1.003 - 1.030      pH (UA) 6.5 5.0 - 8.0      Protein NEGATIVE  NEG mg/dL    Glucose NEGATIVE  NEG mg/dL    Ketone NEGATIVE  NEG mg/dL    Bilirubin NEGATIVE  NEG      Blood NEGATIVE  NEG      Urobilinogen 0.2 0.2 - 1.0 EU/dL    Nitrites NEGATIVE  NEG      Leukocyte Esterase NEGATIVE  NEG      WBC 0-4 0 - 4 /hpf    RBC 0-5 0 - 5 /hpf    Epithelial cells FEW FEW /lpf    Bacteria NEGATIVE  NEG /hpf    UA:UC IF INDICATED CULTURE NOT INDICATED BY UA RESULT CNI      Hyaline cast 0-2 0 - 5 /lpf       Radiologic Studies -   CTA CHEST W OR W WO CONT   Final Result      XR CHEST PA LAT   Final Result        CT Results  (Last 48 hours)               03/28/17 1352  CTA CHEST W OR W WO CONT Final result    Impression:  IMPRESSION:    1. No Pulmonary Embolus.   2. No Acute Findings.           Narrative:  INDICATION: SOB        EXAM: CT Angio Chest:        TECHNIQUE: Unenhanced localizing CT imaging of the pulmonary arteries is   followed by bolus injection of 80 mL Isovue 370 contrast IV, with thin section   axial Chest CT obtained and 3D image post processing performed including coronal   MIPS. CT dose reduction was achieved through use of a standardized protocol   tailored for this examination and automatic exposure control for dose   modulation.       FINDINGS: There is no pulmonary embolism. There is no apparent aortic dissection   or aneurysm.  Lungs are clear. There is no pneumothorax. There is no pleural or   significant pericardial effusion. There is no significant adenopathy. There is   coronary artery calcification.  Visualized thyroid and lower neck soft tissues are unremarkable for age.               CXR Results  (Last 48 hours)               03/28/17 1130  XR CHEST PA LAT Final result    Impression:  IMPRESSION: No acute cardiopulmonary process.       Narrative:  EXAM:  XR CHEST PA LAT       INDICATION:   sob       COMPARISON: Chest radiograph 01/25/2017.       FINDINGS: PA and lateral radiographs of the chest were obtained.       No evidence of focal consolidation. No pleural effusion or pneumothorax.  Heart,   hila, mediastinum are within normal limits. No acute osseous abnormalities.                   Medical Decision Making   I am the first provider for this patient.    I reviewed the vital signs, available nursing notes, past medical history, past surgical history, family history and social history.    Vital Signs-Reviewed the patient's vital signs.  Patient Vitals for the past 12 hrs:   Temp Pulse Resp BP SpO2   03/28/17 1445 - 67 13 95/53 96 %   03/28/17 1324 - 72 15 100/56 98 %   03/28/17 1300 - 73 18 105/58 97 %   03/28/17 1020 98.4 ??F (36.9 ??C) 82 18 123/70 98 %       EKG interpretation: (Preliminary) 10:25  Rhythm: normal sinus rhythm; and regular . Rate (approx.): 71; Axis:  normal; PR interval: normal; QRS interval: normal; ST/T wave: normal.  Written by Lolita Lenz, ED Scribe, as dictated by Alvie Heidelberg, MD.    Records Reviewed: Nursing Notes and Old Medical Records    Provider Notes (Medical Decision Making):   DDx: PE, PNA, renal colic, biliary colic, PUD, coronary syndrome    Impression/Plan: Hx of PE x 2 to ER with R subscapular back pain similar to prior PE. Plan will be to obtain labs, CT, PE protocol and make final disposition pending those results.    ED Course:   Initial assessment performed. The patients presenting problems have been discussed, and they are in agreement with the care plan formulated and outlined with them.  I have encouraged them to ask questions as they arise throughout their visit.    Medications   sodium chloride 0.9 % bolus infusion 1,000 mL (1,000 mL IntraVENous New Bag 03/28/17 1304)   morphine injection 2 mg (2 mg IntraVENous Given 03/28/17 1303)   ondansetron (ZOFRAN) injection 4 mg (4 mg IntraVENous Given 03/28/17 1303)   0.9% sodium chloride infusion (50 mL/hr IntraVENous New Bag 03/28/17 1352)   sodium chloride (NS) flush 10 mL (10 mL IntraVENous Given 03/28/17 1352)   iopamidol (ISOVUE-370) 76 % injection 100 mL (80 mL IntraVENous Given 03/28/17 1352)     Progress Note:  2:50 PM  CT and results reviewed.    Critical Care Time: 0 Minutes    Disposition:  Discharge Note:  3:06 PM  The pt is ready for discharge. The pt's signs, symptoms, diagnosis, and discharge instructions have been discussed and pt has conveyed their understanding. The pt is to follow up as recommended or return to ER should their symptoms worsen. Plan has been discussed and pt is in agreement.      PLAN:  1.   Current Discharge Medication List      START taking these medications    Details   HYDROcodone-acetaminophen (NORCO) 5-325 mg per tablet Take 1 Tab by mouth every four (4) hours as needed for Pain. Max Daily Amount: 6 Tabs.  Qty: 15 Tab, Refills: 0     Associated Diagnoses: Pleurisy; Upper back pain on right side           2.   Follow-up Information     Follow up With Details Comments Contact Info    Acquanetta Sit, MD Schedule an appointment as soon as possible for a visit in 1 week  [address]  [place] [address]  [phone]      Angelique Holm, MD Schedule an appointment as soon as possible for a visit in 1 week  [address]  [place] [address]  [phone]      [facility]  If symptoms worsen [address]  [place]  [phone]        Return to ED if worse     Diagnosis     Clinical Impression:   1. Upper back pain on right side    2. Pleurisy        Attestations:    This note is prepared by Lolita Lenz, acting as Scribe for Alvie Heidelberg, MD.    Alvie Heidelberg, MD: The scribe's documentation has been prepared under my direction and personally reviewed by me in its entirety. I confirm that the note above accurately reflects all work, treatment, procedures, and medical decision making performed by me.

## 2017-03-28 NOTE — Procedures (Signed)
[facility]  PULMONARY FUNCTION    Name:[name], Lindsay A.  MR#: [id number]  DOB: 07/07/1955  ACCOUNT #: [id number]   DATE OF SERVICE: 03/28/2017    Amended document - incorrectly imported as 2D echo; corrected as PFT 06/06/17    REASON FOR TEST:  Cough.    Spirometry and lung volumes were performed and they reveal:  1.  No airflow obstruction.  2.  No restrictive lung disease.  3.  Moderate reduction in DLCO.  4.  Normal flow volume loop.        Lindsay Kunath R. Azariya Freeman, MD       ERG / TN  D: 04/08/2017 14:05     T: 04/08/2017 19:07  JOB #: [id number]  CC: FRANCESCA GLYNN MD

## 2017-03-28 NOTE — ED Notes (Signed)
Discharge instructions reviewed with patient, copy given by Dr. Stratton. Pt is accomponied by family, denies use of wheelchair.

## 2017-03-28 NOTE — Procedures (Deleted)
[facility]    [name]:[name], Lindsay A.  MR#: [id number]  DOB: 1955-12-25  ACCOUNT #: [id number]   DATE OF SERVICE: 03/28/2017    REASON FOR TEST:  Cough.    Spirometry and lung volumes were performed and they reveal:  1.  No airflow obstruction.  2.  No restrictive lung disease.  3.  Moderate reduction in DLCO.  4.  Normal flow volume loop.      Vickki MuffERNESTO R. Thalya Fouche, MD       ERG / TN  D: 04/08/2017 14:05     T: 04/08/2017 19:07  JOB #: [id number]  CC: Golda AcreFRANCESCA GLYNN MD

## 2017-03-28 NOTE — ED Notes (Addendum)
Assumed care of pt from triage, pt ambulatory into ED. Pt accompanied by family, pt reports c/o back pain starting at 3 or 4 am. Pt has been having shortness of breath while moving around and going up and down stairs.

## 2017-03-28 NOTE — ED Notes (Addendum)
Pt resting on stretcher at this time. Pt updated on plan of care and provided with warm blanket. Call bell in reach.

## 2017-03-31 ENCOUNTER — Encounter

## 2017-03-31 MED ORDER — PRAVASTATIN 40 MG TAB
40 mg | ORAL_TABLET | ORAL | 0 refills | Status: DC
Start: 2017-03-31 — End: 2017-05-01

## 2017-04-04 ENCOUNTER — Encounter

## 2017-04-06 ENCOUNTER — Inpatient Hospital Stay: Admit: 2017-04-06 | Payer: BLUE CROSS/BLUE SHIELD | Attending: Gastroenterology | Primary: Internal Medicine

## 2017-04-06 DIAGNOSIS — D509 Iron deficiency anemia, unspecified: Secondary | ICD-10-CM

## 2017-04-08 NOTE — Telephone Encounter (Signed)
-----   Message from Beryle Flock sent at 04/08/2017  1:03 PM EDT -----  Regarding: Dr. Sabra Heck  Pt says [facility] immaging Dept still has Bone Density Test results and will need a Letter head requesting the results of the bone density test sent to [facility]. Please send request to: Fax [phone]. Best contact number [phone]

## 2017-04-10 ENCOUNTER — Encounter: Primary: Internal Medicine

## 2017-04-10 ENCOUNTER — Emergency Department: Admit: 2017-04-10 | Payer: BLUE CROSS/BLUE SHIELD | Primary: Internal Medicine

## 2017-04-10 ENCOUNTER — Inpatient Hospital Stay
Admit: 2017-04-10 | Discharge: 2017-04-10 | Disposition: A | Discharge status: Home / Self Care | Payer: BLUE CROSS/BLUE SHIELD | Attending: Emergency Medicine

## 2017-04-10 ENCOUNTER — Emergency Department: Payer: BLUE CROSS/BLUE SHIELD | Primary: Internal Medicine

## 2017-04-10 ENCOUNTER — Observation Stay

## 2017-04-10 DIAGNOSIS — J9601 Acute respiratory failure with hypoxia: Secondary | ICD-10-CM

## 2017-04-10 LAB — D-DIMER, QUANTITATIVE: D-Dimer, Quant: 2.57 mg/L FEU — ABNORMAL HIGH (ref 0.00–0.65)

## 2017-04-10 LAB — CBC W/O DIFF
ABSOLUTE NRBC: 0 10*3/uL (ref 0.00–0.01)
HCT: 30.2 % — ABNORMAL LOW (ref 35.0–47.0)
HGB: 9.2 g/dL — ABNORMAL LOW (ref 11.5–16.0)
MCH: 23.1 PG — ABNORMAL LOW (ref 26.0–34.0)
MCHC: 30.5 g/dL (ref 30.0–36.5)
MCV: 75.7 FL — ABNORMAL LOW (ref 80.0–99.0)
MPV: 8.6 FL — ABNORMAL LOW (ref 8.9–12.9)
NRBC: 0 PER 100 WBC
PLATELET: 237 10*3/uL (ref 150–400)
RBC: 3.99 M/uL (ref 3.80–5.20)
RDW: 15.3 % — ABNORMAL HIGH (ref 11.5–14.5)
WBC: 5.7 10*3/uL (ref 3.6–11.0)

## 2017-04-10 LAB — METABOLIC PANEL, COMPREHENSIVE
A-G Ratio: 1 — ABNORMAL LOW (ref 1.1–2.2)
ALT (SGPT): 18 U/L (ref 12–78)
AST (SGOT): 19 U/L (ref 15–37)
Albumin: 3.7 g/dL (ref 3.5–5.0)
Alk. phosphatase: 188 U/L — ABNORMAL HIGH (ref 45–117)
Anion gap: 8 mmol/L (ref 5–15)
BUN/Creatinine ratio: 21 — ABNORMAL HIGH (ref 12–20)
BUN: 15 MG/DL (ref 6–20)
Bilirubin, total: 0.1 MG/DL — ABNORMAL LOW (ref 0.2–1.0)
CO2: 28 mmol/L (ref 21–32)
Calcium: 8.6 MG/DL (ref 8.5–10.1)
Chloride: 102 mmol/L (ref 97–108)
Creatinine: 0.73 MG/DL (ref 0.55–1.02)
GFR est AA: >60 mL/min/{1.73_m2} (ref >60)
GFR est non-AA: >60 mL/min/{1.73_m2} (ref >60)
Globulin: 3.6 g/dL (ref 2.0–4.0)
Glucose: 103 mg/dL — ABNORMAL HIGH (ref 65–100)
Potassium: 4.3 mmol/L (ref 3.5–5.1)
Protein, total: 7.3 g/dL (ref 6.4–8.2)
Sodium: 138 mmol/L (ref 136–145)

## 2017-04-10 LAB — URINALYSIS W/ REFLEX CULTURE
Bacteria: NEGATIVE /hpf
Bilirubin: NEGATIVE
Blood: NEGATIVE
Glucose: NEGATIVE mg/dL
Ketone: NEGATIVE mg/dL
Leukocyte Esterase: NEGATIVE
Nitrites: NEGATIVE
Protein: NEGATIVE mg/dL
Specific gravity: >1.03 — ABNORMAL HIGH (ref 1.003–1.030)
Urobilinogen: 0.2 EU/dL (ref 0.2–1.0)
pH (UA): 7 (ref 5.0–8.0)

## 2017-04-10 LAB — INFLUENZA A & B AG (RAPID TEST)
Influenza A Antigen: NEGATIVE
Influenza B Antigen: NEGATIVE

## 2017-04-10 LAB — CK W/ REFLX CKMB: CK: 135 U/L (ref 26–192)

## 2017-04-10 LAB — D DIMER: D-dimer: 2.57 mg/L FEU — ABNORMAL HIGH (ref 0.00–0.65)

## 2017-04-10 LAB — TROPONIN I: Troponin-I, Qt.: <0.05 ng/mL (ref <0.05)

## 2017-04-10 MED ORDER — .PHARMACY TO SUBSTITUTE PER PROTOCOL
Status: DC | PRN
Start: 2017-04-10 — End: 2017-04-11

## 2017-04-10 MED ORDER — FLU VACCINE QV 2018-19 (6 MOS+)(PF) 60 MCG (15 MCG X 4)/0.5 ML IM SYRINGE
60 mcg (15 mcg x 4)/0.5 mL | INTRAMUSCULAR | Status: AC
Start: 2017-04-10 — End: 2017-04-11
  Administered 2017-04-11: 16:00:00 via INTRAMUSCULAR

## 2017-04-10 MED ORDER — OXYCODONE-ACETAMINOPHEN 5 MG-325 MG TAB
5-325 mg | ORAL | Status: AC
Start: 2017-04-10 — End: 2017-04-10
  Administered 2017-04-10: 18:00:00 via ORAL

## 2017-04-10 MED ORDER — PANTOPRAZOLE 40 MG TAB, DELAYED RELEASE
40 mg | Freq: Every day | ORAL | Status: DC
Start: 2017-04-10 — End: 2017-04-11
  Administered 2017-04-11: 13:00:00 via ORAL

## 2017-04-10 MED ORDER — PRAVASTATIN 40 MG TAB
40 mg | Freq: Every evening | ORAL | Status: DC
Start: 2017-04-10 — End: 2017-04-11
  Administered 2017-04-11: 02:00:00 via ORAL

## 2017-04-10 MED ORDER — SODIUM CHLORIDE 0.9 % IV
Freq: Once | INTRAVENOUS | Status: AC
Start: 2017-04-10 — End: 2017-04-10
  Administered 2017-04-10: 18:00:00 via INTRAVENOUS

## 2017-04-10 MED ORDER — ERGOCALCIFEROL (VITAMIN D2) 50,000 UNIT CAP
1250 mcg (50,000 unit) | ORAL | Status: DC
Start: 2017-04-10 — End: 2017-04-11
  Administered 2017-04-10: 23:00:00 via ORAL

## 2017-04-10 MED ORDER — ASPIRIN 81 MG CHEWABLE TAB
81 mg | Freq: Every day | ORAL | Status: DC
Start: 2017-04-10 — End: 2017-04-11
  Administered 2017-04-11: 13:00:00 via ORAL

## 2017-04-10 MED ORDER — IPRATROPIUM-ALBUTEROL 2.5 MG-0.5 MG/3 ML NEB SOLUTION
2.5 mg-0.5 mg/3 ml | Freq: Four times a day (QID) | RESPIRATORY_TRACT | Status: DC
Start: 2017-04-10 — End: 2017-04-11
  Administered 2017-04-11 (×3): via RESPIRATORY_TRACT

## 2017-04-10 MED ORDER — IOPAMIDOL 76 % IV SOLN
370 mg iodine /mL (76 %) | Freq: Once | INTRAVENOUS | Status: AC
Start: 2017-04-10 — End: 2017-04-10
  Administered 2017-04-10: 18:00:00 via INTRAVENOUS

## 2017-04-10 MED ORDER — ROPINIROLE 1 MG TAB
1 mg | Freq: Every evening | ORAL | Status: DC
Start: 2017-04-10 — End: 2017-04-11
  Administered 2017-04-11: 02:00:00 via ORAL

## 2017-04-10 MED ORDER — ENOXAPARIN 30 MG/0.3 ML SUB-Q SYRINGE
30 mg/0.3 mL | SUBCUTANEOUS | Status: DC
Start: 2017-04-10 — End: 2017-04-11
  Administered 2017-04-10: 23:00:00 via SUBCUTANEOUS

## 2017-04-10 MED ORDER — DIAZEPAM 5 MG TAB
5 mg | Freq: Every evening | ORAL | Status: DC
Start: 2017-04-10 — End: 2017-04-11
  Administered 2017-04-11: 02:00:00 via ORAL

## 2017-04-10 MED ORDER — MECLIZINE 12.5 MG TAB
12.5 mg | ORAL | Status: AC
Start: 2017-04-10 — End: 2017-04-10
  Administered 2017-04-10: 20:00:00 via ORAL

## 2017-04-10 MED ORDER — FERROUS SULFATE 325 MG (65 MG ELEMENTAL IRON) TAB
325 mg (65 mg iron) | Freq: Every day | ORAL | Status: DC
Start: 2017-04-10 — End: 2017-04-11
  Administered 2017-04-11: 13:00:00 via ORAL

## 2017-04-10 MED ORDER — SODIUM CHLORIDE 0.9 % IJ SYRG
Freq: Once | INTRAMUSCULAR | Status: AC
Start: 2017-04-10 — End: 2017-04-10
  Administered 2017-04-10: 18:00:00 via INTRAVENOUS

## 2017-04-10 MED ORDER — OXYCODONE-ACETAMINOPHEN 5 MG-325 MG TAB
5-325 mg | Freq: Four times a day (QID) | ORAL | Status: DC | PRN
Start: 2017-04-10 — End: 2017-04-11
  Administered 2017-04-11: 09:00:00 via ORAL

## 2017-04-10 MED ORDER — ACETAMINOPHEN 325 MG TABLET
325 mg | ORAL | Status: DC | PRN
Start: 2017-04-10 — End: 2017-04-11

## 2017-04-10 MED ORDER — OXYCODONE-ACETAMINOPHEN 5 MG-325 MG TAB
5-325 mg | ORAL_TABLET | Freq: Three times a day (TID) | ORAL | 0 refills | Status: DC | PRN
Start: 2017-04-10 — End: 2017-05-23

## 2017-04-10 MED ORDER — LABETALOL 5 MG/ML IV SOLN
5 mg/mL | Freq: Four times a day (QID) | INTRAVENOUS | Status: DC | PRN
Start: 2017-04-10 — End: 2017-04-11

## 2017-04-10 MED ORDER — LEVOFLOXACIN 500 MG TAB
500 mg | ORAL | Status: DC
Start: 2017-04-10 — End: 2017-04-11
  Administered 2017-04-10: 23:00:00 via ORAL

## 2017-04-10 MED ORDER — ESCITALOPRAM 10 MG TAB
10 mg | Freq: Every day | ORAL | Status: DC
Start: 2017-04-10 — End: 2017-04-11

## 2017-04-10 MED ORDER — ONDANSETRON (PF) 4 MG/2 ML INJECTION
4 mg/2 mL | Freq: Four times a day (QID) | INTRAMUSCULAR | Status: DC | PRN
Start: 2017-04-10 — End: 2017-04-11

## 2017-04-10 MED ORDER — GUAIFENESIN 100 MG/5 ML ORAL LIQUID
100 mg/5 mL | Freq: Three times a day (TID) | ORAL | Status: DC
Start: 2017-04-10 — End: 2017-04-11
  Administered 2017-04-11 (×2): via ORAL

## 2017-04-10 MED FILL — SODIUM CHLORIDE 0.9 % IV: INTRAVENOUS | Qty: 1000

## 2017-04-10 MED FILL — MECLIZINE 12.5 MG TAB: 12.5 mg | ORAL | Qty: 2

## 2017-04-10 MED FILL — DRISDOL 1,250 MCG (50,000 UNIT) CAPSULE: 1250 mcg (50,000 unit) | ORAL | Qty: 1

## 2017-04-10 MED FILL — ENOXAPARIN 30 MG/0.3 ML SUB-Q SYRINGE: 30 mg/0.3 mL | SUBCUTANEOUS | Qty: 0.6

## 2017-04-10 MED FILL — LEVOFLOXACIN 500 MG TAB: 500 mg | ORAL | Qty: 1

## 2017-04-10 MED FILL — VITAMIN D2 1,250 MCG (50,000 UNIT) CAPSULE: 1250 mcg (50,000 unit) | ORAL | Qty: 1

## 2017-04-10 MED FILL — OXYCODONE-ACETAMINOPHEN 5 MG-325 MG TAB: 5-325 mg | ORAL | Qty: 1

## 2017-04-10 NOTE — ED Provider Notes (Signed)
EMERGENCY DEPARTMENT HISTORY AND PHYSICAL EXAM      Date: 04/10/2017  Patient Name: Lindsay [name]    History of Presenting Illness     Chief Complaint   Patient presents with   ??? Shortness of Breath     Pt has had shortness of breath that started several weeks ago but became worse yesterday   ??? Chest Pain     left mid-sternal chest pain.        History Provided By: Patient and EMS    HPI: Lindsay [name], 61 y.o. female with PMHx significant for PE, arthritis, ans DVT, presents by EMS to the ED with cc of worsening SOB x several weeks. Pt endorses associated right calf pain, cough, chills, lightheadedness with sitting and standing, and central chest pain which only presents with respiration. Pt denies any modifying factors. Pt states that she has a Hx of similar sxs in the past which were the result of PE and DVT. Pt explains that she was evaluated for similar sxs in the ED on 03/28/17 during which she has a negative CTA of the chest and she was discharged with Norco. Pt notes that her sxs resolved briefly, but they returned over the past several days which prompted her to go to [facility] for further evaluation. Pt reports that she was evaluated at [facility] for her sxs this morning during which she had a normal CBC, but she was instructed to come into the ED to rule out a DVT or PE. Of note, pt reports that she was placed on 2L NC at [facility] due to her decreased O2 saturation. Pt denies the use of anticoagulants, and she endorses a FHx of heart disease, HTN, and A Fib. Pt denies Hx of asthma. Pt specifically denies HA.     There are no other complaints, changes, or physical findings at this time.    PSHx: significant for none  Social Hx: - tobacco, - EtOH, - illicit drug use    PCP: Flora Lipps, MD    Current Outpatient Prescriptions   Medication Sig Dispense Refill   ??? oxyCODONE-acetaminophen (PERCOCET) 5-325 mg per tablet Take 1 Tab by  mouth every eight (8) hours as needed for Pain. Max Daily Amount: 3 Tabs. Indications: Pain 10 Tab 0   ??? pravastatin (PRAVACHOL) 40 mg tablet TAKE 1 TABLET BY MOUTH EVERY NIGHT 30 Tab 0   ??? rOPINIRole (REQUIP) 1 mg tablet TAKE 4 TABLETS BY MOUTH 1 TO 3 HOURS BEFORE BEDTIME EVERYDAY 120 Tab 5   ??? pravastatin (PRAVACHOL) 40 mg tablet 1 po every day Please make a med check appointment in the next month. Thank you. 30 Tab 0   ??? ferrous sulfate 325 mg (65 mg iron) tablet Take 1 Tab by mouth daily (with breakfast). 10 Tab 0   ??? diazePAM (VALIUM) 5 mg tablet Take 10 mg by mouth nightly. 5 mg every morning and 10 mg QHS  5   ??? gabapentin enacarbil (HORIZANT) 600 mg TbER Take 1 Tab by mouth every evening. 30 Tab 11   ??? aspirin 81 mg chewable tablet Take 1 Tab by mouth daily. 30 Tab 0   ??? ergocalciferol (ERGOCALCIFEROL) 50,000 unit capsule Take 1 Cap by mouth every seven (7) days. 12 Cap 1   ??? escitalopram oxalate (LEXAPRO) 20 mg tablet Take 1 Tab by mouth daily. 30 Tab 5   ??? omeprazole (PRILOSEC) 20 mg capsule Take 20 mg by mouth nightly.  Past History     Past Medical History:  Past Medical History:   Diagnosis Date   ??? Abnormal brain scan    ??? Arthritis    ??? Chronic pain    ??? Depression    ??? DVT (deep venous thrombosis) ([place])    ??? Falls frequently    ??? Fracture of left ankle    ??? Headache(784.0) 2011   ??? Ill-defined condition 09/15/2015    FX R HAND   ??? Other and unspecified hyperlipidemia 12/27/2012   ??? PE (pulmonary embolism) 12/2012   ??? RLS (restless legs syndrome) 03/16/2016    Dr Roseanne Reno   ??? TIA (transient ischemic attack) 03/2016    Right facial droop, numbness in lip   ??? Torn rotator cuff     right       Past Surgical History:  Past Surgical History:   Procedure Laterality Date   ??? BREAST SURGERY PROCEDURE UNLISTED  2002    AUGMENTATION   ??? HX APPENDECTOMY     ??? HX CESAREAN SECTION      x4   ??? HX GI      COLONOSCOPY   ??? 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 HX ORTHOPAEDIC  08/2004     rotater cuff r. shoulder   ??? HX ORTHOPAEDIC      left ankle fracture treated with air cast   ??? HX ORTHOPAEDIC  6/13    left shoulder fxt and dislocation   ??? HX OTHER SURGICAL  2010    left foot torn tendon   ??? HX TUBAL LIGATION     ??? IMPLANT BREAST SILICONE/EQ  1478    Bilateral breast implants       Family History:  Family History   Problem Relation Age of Onset   ??? Heart Disease Mother    ??? Heart Disease Father    ??? Cancer Father      thyroid cancer   ??? Arthritis-rheumatoid Sister    ??? No Known Problems Son    ??? No Known Problems Daughter    ??? No Known Problems Daughter    ??? No Known Problems Daughter    ??? Anesth Problems Neg Hx        Social History:  Social History   Substance Use Topics   ??? Smoking status: Never Smoker   ??? Smokeless tobacco: Never Used   ??? Alcohol use No      Comment: Pt denies       Allergies:  No Known Allergies      Review of Systems   Review of Systems   Constitutional: Positive for chills.   HENT: Negative for congestion.    Eyes: Negative.    Respiratory: Positive for cough and shortness of breath.    Cardiovascular: Positive for chest pain.   Gastrointestinal: Negative for abdominal pain.   Endocrine: Negative for heat intolerance.   Genitourinary: Negative.    Musculoskeletal: Positive for myalgias.   Skin: Negative for rash.   Allergic/Immunologic: Negative for immunocompromised state.   Neurological: Positive for light-headedness.   Hematological: Does not bruise/bleed easily.   Psychiatric/Behavioral: Negative.    All other systems reviewed and are negative.      Physical Exam   Physical Exam   Constitutional: She is oriented to person, place, and time. She appears well-developed and well-nourished. No distress.   HENT:   Head: Normocephalic and atraumatic.   Eyes: EOM are normal. Pupils are equal, round, and reactive to  light.   Neck: Normal range of motion. Neck supple.   Cardiovascular: Normal rate, regular rhythm and normal heart sounds.     Pulmonary/Chest: Effort normal and breath sounds normal. No respiratory distress. She exhibits tenderness (Reproducible).   Lungs are clear   Abdominal: Soft. Bowel sounds are normal. She exhibits no mass. There is no tenderness.   Musculoskeletal: Normal range of motion.   Trace edema in the right leg, legs are non-tender   Neurological: She is alert and oriented to person, place, and time. Coordination normal.   Skin: Skin is warm and dry.   Psychiatric: She has a normal mood and affect. Her behavior is normal.   Nursing note and vitals reviewed.      Diagnostic Study Results     Labs -     Recent Results (from the past 12 hour(s))   EKG, 12 LEAD, INITIAL    Collection Time: 04/10/17 11:22 AM   Result Value Ref Range    Ventricular Rate 58 BPM    Atrial Rate 58 BPM    P-R Interval 124 ms    QRS Duration 78 ms    Q-T Interval 464 ms    QTC Calculation (Bezet) 455 ms    Calculated P Axis 1 degrees    Calculated R Axis 73 degrees    Calculated T Axis 70 degrees    Diagnosis       Sinus bradycardia  When compared with ECG of 28-Mar-2017 10:25,  Questionable change in QRS axis  Nonspecific T wave abnormality no longer evident in Inferior leads     TROPONIN I    Collection Time: 04/10/17 11:42 AM   Result Value Ref Range    Troponin-I, Qt. <0.05 <0.05 ng/mL   CK W/ REFLX CKMB    Collection Time: 04/10/17 11:42 AM   Result Value Ref Range    CK 135 26 - 096 U/L   METABOLIC PANEL, COMPREHENSIVE    Collection Time: 04/10/17 11:42 AM   Result Value Ref Range    Sodium 138 136 - 145 mmol/L    Potassium 4.3 3.5 - 5.1 mmol/L    Chloride 102 97 - 108 mmol/L    CO2 28 21 - 32 mmol/L    Anion gap 8 5 - 15 mmol/L    Glucose 103 (H) 65 - 100 mg/dL    BUN 15 6 - 20 MG/DL    Creatinine 0.73 0.55 - 1.02 MG/DL    BUN/Creatinine ratio 21 (H) 12 - 20      GFR est AA >60 >60 ml/min/1.32m    GFR est non-AA >60 >60 ml/min/1.753m   Calcium 8.6 8.5 - 10.1 MG/DL    Bilirubin, total 0.1 (L) 0.2 - 1.0 MG/DL    ALT (SGPT) 18 12 - 78 U/L     AST (SGOT) 19 15 - 37 U/L    Alk. phosphatase 188 (H) 45 - 117 U/L    Protein, total 7.3 6.4 - 8.2 g/dL    Albumin 3.7 3.5 - 5.0 g/dL    Globulin 3.6 2.0 - 4.0 g/dL    A-G Ratio 1.0 (L) 1.1 - 2.2     CBC W/O DIFF    Collection Time: 04/10/17 11:42 AM   Result Value Ref Range    WBC 5.7 3.6 - 11.0 K/uL    RBC 3.99 3.80 - 5.20 M/uL    HGB 9.2 (L) 11.5 - 16.0 g/dL    HCT 30.2 (L) 35.0 - 47.0 %  MCV 75.7 (L) 80.0 - 99.0 FL    MCH 23.1 (L) 26.0 - 34.0 PG    MCHC 30.5 30.0 - 36.5 g/dL    RDW 15.3 (H) 11.5 - 14.5 %    PLATELET 237 150 - 400 K/uL    MPV 8.6 (L) 8.9 - 12.9 FL    NRBC 0.0 0 PER 100 WBC    ABSOLUTE NRBC 0.00 0.00 - 0.01 K/uL   D DIMER    Collection Time: 04/10/17  1:00 PM   Result Value Ref Range    D-dimer 2.57 (H) 0.00 - 0.65 mg/L FEU       Radiologic Studies -            DUPLEX LOWER EXT VENOUS BILAT   Final Result   Initial Result:   ?? Impression:   ?? IMPRESSION: No deep venous thrombosis identified.       ?? Narrative:   ?? EXAM: ??DUPLEX LOWER EXT VENOUS BILAT   INDICATION: Leg swelling, pain, DVT suspected.  COMPARISON: 01/25/2017    FINDINGS: Duplex Doppler sonography of the right and left lower extremities was  performed from the groin to the calf. The right and left common femoral, femoral  and popliteal veins are compressible with normal color-flow and wave forms and  response to physiologic maneuvers including Valsalva and augmentation.     ??          CT Results  (Last 48 hours)               04/10/17 1427  CTA CHEST W OR W WO CONT Final result    Impression:  IMPRESSION: Normal CT arteriogram of the chest. No pulmonary embolus. No change   compared with 2 weeks ago.           Narrative:  EXAM: CT ANGIOGRAPHY CHEST    INDICATION: Chest pain, acute, pulmonary embolism (PE) suspected; elevated   d-dimer/SOB.   COMPARISON: 03/28/2017.   CONTRAST: 100 mL of Isovue-370.       TECHNIQUE:    Precontrast scout images were obtained to localize the volume for acquisition.    Multislice helical CT arteriography was performed from the diaphragm to the   thoracic inlet during uneventful rapid bolus intravenous contrast   administration.  Lung and soft tissue windows were generated. Coronal and   sagittal  reformatted images were also generated and 3D post processing   consisting of coronal maximum intensity projection images was performed. CT dose   reduction was achieved through use of a standardized protocol tailored for this   examination and automatic exposure control for dose modulation.       FINDINGS:   LOWER NECK: The visualized portions of the thyroid and structures of the lower   neck are within normal limits.   LUNGS: The lungs are clear of mass, nodule, airspace disease or edema.     PLEURA: There is no pleural effusion or pneumothorax.   PULMONARY ARTERIES: The pulmonary arteries are well enhanced and no pulmonary   emboli are identified.   AORTA: The aorta enhances normally without evidence of aneurysm or dissection.   MEDIASTINUM: There is no mediastinal or hilar adenopathy or mass.    BONES AND SOFT TISSUES: There are bilateral breast implants.   UPPER ABDOMEN: The visualized portions of the upper abdominal organs are normal.               Medical Decision Making   I am the first provider for this  patient.    I reviewed the vital signs, available nursing notes, past medical history, past surgical history, family history and social history.    Vital Signs-Reviewed the patient's vital signs.  Patient Vitals for the past 12 hrs:   Temp Pulse Resp BP SpO2   04/10/17 1315 - 72 14 - 92 %   04/10/17 1300 - 63 15 - 92 %   04/10/17 1245 - 64 12 - 93 %   04/10/17 1200 - 69 24 136/75 95 %   04/10/17 1145 - 63 22 - 98 %   04/10/17 1133 - - - - 100 %   04/10/17 1130 - 60 15 122/79 96 %   04/10/17 1124 98.4 ??F (36.9 ??C) 60 16 122/84 98 %       Pulse Oximetry Analysis - 98% on RA    Cardiac Monitor:   Rate: 59 bpm  Rhythm: Normal Sinus Rhythm      EKG interpretation: (Preliminary) 11:22   Rhythm: sinus bradycardia; and regular . Rate (approx.): 58 bpm; Axis: normal; PR interval: normal; QRS interval: normal ; ST/T wave: normal.  Written by Estevan Ryder, ED Scribe, as dictated by Seward Speck, MD.    Records Reviewed: Nursing Notes, Old Medical Records, Previous electrocardiograms, Ambulance Run Sheet, Previous Radiology Studies and Previous Laboratory Studies    Provider Notes (Medical Decision Making):   DDx: PE, DVT, anxiety, CAD, bronchitis, PNA, costochondritis    ED Course:   Initial assessment performed. The patients presenting problems have been discussed, and they are in agreement with the care plan formulated and outlined with them.  I have encouraged them to ask questions as they arise throughout their visit.    PROGRESS NOTE:   3:06 PM  Upon discharge, pt is complaining about dizziness and a tight sensation around her head since yesterday (04/06/17). She additionally expressed her concern over her SpO2 levels. Will order a CT head now.     Critical Care Time:   35    CRITICAL CARE NOTE :    8:17 PM      IMPENDING DETERIORATION -Respiratory and Cardiovascular    ASSOCIATED RISK FACTORS - Hypoxia, Dysrhythmia, Metabolic changes and Dehydration    MANAGEMENT- Bedside Assessment and Supervision of Care    INTERPRETATION -  Xrays, CT Scan, ECG and Blood Pressure    INTERVENTIONS - hemodynamic mngmt and Metobolic interventions    CASE REVIEW - Hospitalist, Nursing and Family    TREATMENT RESPONSE -Unchanged     PERFORMED BY - Self and Physician        NOTES   :      I have spent 35 minutes of critical care time involved in lab review, consultations with specialist, family decision- making, bedside attention and documentation. During this entire length of time I was immediately available to the patient .    Seward Speck, MD                                                  Disposition:    4:13 PM  The patient desaturated to 87% on RA with ambulation. She will be admitted by the hospitalist.       PLAN:  1.   Current Discharge Medication List      START taking these medications    Details   oxyCODONE-acetaminophen (PERCOCET) 5-325  mg per tablet Take 1 Tab by mouth every eight (8) hours as needed for Pain. Max Daily Amount: 3 Tabs. Indications: Pain  Qty: 10 Tab, Refills: 0    Associated Diagnoses: Acute chest pain           2.   Follow-up Information     Follow up With Details Comments [place], MD In 2 days As needed [address]  [place] [id number]  [phone]      [facility]  If symptoms worsen [place]  [phone]        Return to ED if worse     Diagnosis     Clinical Impression:   1. Acute respiratory failure with hypoxia ([place])    2. Acute chest pain    3. Right leg pain        Attestations:    This note is prepared by Angelena Form, acting as Scribe for Seward Speck, MD.    Seward Speck, MD: The scribe's documentation has been prepared under my direction and personally reviewed by me in its entirety. I confirm that the note above accurately reflects all work, treatment, procedures, and medical decision making performed by me.

## 2017-04-10 NOTE — ED Notes (Signed)
Assumed care of pt from EMS. Pt c/o shortness of breath that started several weeks ago that became worse Thursday and yesterday. Pt states she has more trouble with breathing when she is walking or doing physical activity. Pt has hx of 4 blood clots, 1 in each lung and 1 in each leg. Pt states she has been lethargic. EMS reports O2 sat on room air was 93%, pt 98% on room air currently, cardiac monitor x3, call light within reach, vitals stable.

## 2017-04-10 NOTE — ED Notes (Addendum)
Pt c/o headache. Pt O2 sat drops between 88%-92%. Pt placed on 2L O2. Pt states her HR is lower than it is when she takes it at home.

## 2017-04-10 NOTE — H&P (Addendum)
Hospitalist Admission Note    NAME: Lindsay [name]   DOB:  [birth year]   MRN:  [id number]     Date/Time:  04/10/2017 5:23 PM    Patient PCP: Flora Lipps, MD  ______________________________________________________________________  Given the patient's current clinical presentation, I have a high level of concern for decompensation if discharged from the emergency department.  Complex decision making was performed, which includes reviewing the patient's available past medical records, laboratory results, and x-ray films.       My assessment of this patient's clinical condition and my plan of care is as follows.    Assessment / Plan:  Acute Hypoxic Respiratory Failure POA: O2 sat 87% on RA while ambulating, check Flu test, may be 2ry to atelectasis and early pneumonia. 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  Restless Legs Sd: c/w home regimen.  Depression: c/w lexapro.  Code Status: Full Code  Surrogate Decision Maker: husband Winter [phone]  DVT Prophylaxis: lovenox  GI Prophylaxis: not indicated  Baseline: independent lives with husband      Subjective:   CHIEF COMPLAINT: "I feel SOB"    HISTORY OF PRESENT ILLNESS:     Lindsay [name] is a 61 y.o.  Caucasian female  with PMHx significant for PE, arthritis, ans DVT, presents by EMS to the ED with cc of worsening SOB x several weeks. Pt endorses associated right calf pain, cough, chills, lightheadedness with sitting and standing, and central chest pain which only presents with respiration. Pt denies any modifying factors. Pt states that she has a Hx of similar sxs in the past which were the result of PE  and DVT. Pt explains that she was evaluated for similar sxs in the ED on 03/28/17 during which she has a negative CTA of the chest and she was discharged with Norco. Pt notes that her sxs resolved briefly, but they returned over the past several days which prompted her to go to [facility] for further evaluation. Pt reports that she was evaluated at [facility] for her sxs this morning during which she had a normal CBC, but she was instructed to come into the ED to rule out a DVT or PE. Of note, pt reports that she was placed on 2L NC at [facility] due to her decreased O2 saturation. Pt denies the use of anticoagulants, and she endorses a FHx of heart disease, HTN, and A Fib. Pt denies Hx of asthma. Pt specifically denies HA.   At this time patient is lying in bed states that she is feeling SOB and her O2 sat dropped while ambulating, has some cough and night and had been feeling cold, denies N/V no diarrhea, no fever, no urinary symptoms, no other associated symptoms.  We were asked to admit for work up and evaluation of the above problems.     Past Medical History:   Diagnosis Date   ??? Abnormal brain scan    ??? Arthritis    ??? Chronic pain    ??? Depression    ??? DVT (deep venous thrombosis) ([place])    ??? Falls frequently    ??? Fracture of left ankle    ??? Headache(784.0)  2011   ??? Ill-defined condition 09/15/2015    FX R HAND   ??? Other and unspecified hyperlipidemia 12/27/2012   ??? PE (pulmonary embolism) 12/2012   ??? RLS (restless legs syndrome) 03/16/2016    Dr Roseanne Reno   ??? TIA (transient ischemic attack) 03/2016    Right facial droop, numbness in lip   ??? Torn rotator cuff     right        Past Surgical History:   Procedure Laterality Date   ??? BREAST SURGERY PROCEDURE UNLISTED  2002    AUGMENTATION   ??? HX APPENDECTOMY     ??? HX CESAREAN SECTION      x4   ??? HX GI      COLONOSCOPY   ??? 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 HX ORTHOPAEDIC  08/2004    rotater cuff r. shoulder   ??? HX ORTHOPAEDIC       left ankle fracture treated with air cast   ??? HX ORTHOPAEDIC  6/13    left shoulder fxt and dislocation   ??? HX OTHER SURGICAL  2010    left foot torn tendon   ??? HX TUBAL LIGATION     ??? IMPLANT BREAST SILICONE/EQ  1610    Bilateral breast implants       Social History   Substance Use Topics   ??? Smoking status: Never Smoker   ??? Smokeless tobacco: Never Used   ??? Alcohol use No      Comment: Pt denies        Family History   Problem Relation Age of Onset   ??? Heart Disease Mother    ??? Heart Disease Father    ??? Cancer Father      thyroid cancer   ??? Arthritis-rheumatoid Sister    ??? No Known Problems Son    ??? No Known Problems Daughter    ??? No Known Problems Daughter    ??? No Known Problems Daughter    ??? Anesth Problems Neg Hx      No Known Allergies     Prior to Admission medications    Medication Sig Start Date End Date Taking? Authorizing Provider   oxyCODONE-acetaminophen (PERCOCET) 5-325 mg per tablet Take 1 Tab by mouth every eight (8) hours as needed for Pain. Max Daily Amount: 3 Tabs. Indications: Pain 04/10/17  Yes Seward Speck, MD   pravastatin (PRAVACHOL) 40 mg tablet TAKE 1 TABLET BY MOUTH EVERY NIGHT 03/31/17   Flora Lipps, MD   rOPINIRole (REQUIP) 1 mg tablet TAKE 4 TABLETS BY MOUTH 1 TO 3 HOURS BEFORE BEDTIME EVERYDAY 02/12/17   Chauncey Reading, MD   pravastatin (PRAVACHOL) 40 mg tablet 1 po every day Please make a med check appointment in the next month. Thank you. 12/30/16   Flora Lipps, MD   ferrous sulfate 325 mg (65 mg iron) tablet Take 1 Tab by mouth daily (with breakfast). 12/11/16   Eldridge Dace, MD   diazePAM (VALIUM) 5 mg tablet Take 10 mg by mouth nightly. 5 mg every morning and 10 mg QHS 08/28/16   Historical Provider   gabapentin enacarbil (HORIZANT) 600 mg TbER Take 1 Tab by mouth every evening. 04/23/16   Chauncey Reading, MD   aspirin 81 mg chewable tablet Take 1 Tab by mouth daily. 03/27/16   Jettie Booze, MD   ergocalciferol (ERGOCALCIFEROL) 50,000 unit capsule Take 1 Cap by mouth  every seven (7) days. 11/08/14  Flora Lipps, MD   escitalopram oxalate (LEXAPRO) 20 mg tablet Take 1 Tab by mouth daily. 01/07/14   Flora Lipps, MD   omeprazole (PRILOSEC) 20 mg capsule Take 20 mg by mouth nightly.    Historical Provider       REVIEW OF SYSTEMS:     I am not able to complete the review of systems because:   The patient is intubated and sedated    The patient has altered mental status due to his acute medical problems    The patient has baseline aphasia from prior stroke(s)    The patient has baseline dementia and is not reliable historian    The patient is in acute medical distress and unable to provide information           Total of 12 systems reviewed as follows:       POSITIVE= underlined text  Negative = text not underlined  General:  fever, chills, sweats, generalized weakness, weight loss/gain,      loss of appetite   Eyes:    blurred vision, eye pain, loss of vision, double vision  ENT:    rhinorrhea, pharyngitis   Respiratory:   cough, sputum production, SOB, DOE, wheezing, pleuritic pain   Cardiology:   chest pain, palpitations, orthopnea, PND, edema, syncope   Gastrointestinal:  abdominal pain , N/V, diarrhea, dysphagia, constipation, bleeding   Genitourinary:  frequency, urgency, dysuria, hematuria, incontinence   Muskuloskeletal :  arthralgia, myalgia, back pain  Hematology:  easy bruising, nose or gum bleeding, lymphadenopathy   Dermatological: rash, ulceration, pruritis, color change / jaundice  Endocrine:   hot flashes or polydipsia   Neurological:  headache, dizziness, confusion, focal weakness, paresthesia,     Speech difficulties, memory loss, gait difficulty  Psychological: Feelings of anxiety, depression, agitation    Objective:   VITALS:    Visit Vitals   ??? BP 130/76   ??? Pulse 60   ??? Temp 98.4 ??F (36.9 ??C)   ??? Resp 14   ??? Ht '5\' 3"'  (1.6 m)   ??? Wt 79.4 kg (175 lb)   ??? SpO2 91%   ??? BMI 31 kg/m2       PHYSICAL EXAM:     General:    Alert, cooperative, SOB, appears stated age.     HEENT: Atraumatic, anicteric sclerae, pink conjunctivae     No oral ulcers, mucosa moist, throat clear, dentition fair  Neck:  Supple, symmetrical,  thyroid: non tender  Lungs:   Coarse BS, rhonchi  Chest wall:  No tenderness  No Accessory muscle use.  Heart:   Regular  rhythm,  No  murmur   No edema  Abdomen:   Soft, non-tender. Not distended.  Bowel sounds normal  Extremities: No cyanosis.  No clubbing,      Skin turgor normal, Capillary refill normal, Radial  pulse 2+  Skin:     Not pale.  Not Jaundiced  No rashes   Psych:  Good insight.  Not depressed.  Not anxious or agitated.  Neurologic: EOMs intact. No facial asymmetry. No aphasia or slurred speech. Symmetrical strength, Sensation grossly intact. Alert and oriented X 4.     _______________________________________________________________________  Care Plan discussed with:    Comments   Patient y    Family      RN y    Transport planner                    Consultant:  _______________________________________________________________________  Expected  Disposition:   Home with Family y   HH/PT/OT/RN    SNF/LTC    SA[facility]    ________________________________________________________________________  TOTAL TIME:  28 Minutes    Critical Care Provided     Minutes non procedure based      Comments    y Reviewed previous records   >50% of visit spent in counseling and coordination of care y Discussion with patient and/or family and questions answered       ________________________________________________________________________  Signed: Amelia Jo, MD    Procedures: see electronic medical records for all procedures/Xrays and details which were not copied into this note but were reviewed prior to creation of Plan.    LAB DATA REVIEWED:    Recent Results (from the past 24 hour(s))   EKG, 12 LEAD, INITIAL    Collection Time: 04/10/17 11:22 AM   Result Value Ref Range    Ventricular Rate 58 BPM     Atrial Rate 58 BPM    P-R Interval 124 ms    QRS Duration 78 ms    Q-T Interval 464 ms    QTC Calculation (Bezet) 455 ms    Calculated P Axis 1 degrees    Calculated R Axis 73 degrees    Calculated T Axis 70 degrees    Diagnosis       Sinus bradycardia  When compared with ECG of 28-Mar-2017 10:25,  Questionable change in QRS axis  Nonspecific T wave abnormality no longer evident in Inferior leads     TROPONIN I    Collection Time: 04/10/17 11:42 AM   Result Value Ref Range    Troponin-I, Qt. <0.05 <0.05 ng/mL   CK W/ REFLX CKMB    Collection Time: 04/10/17 11:42 AM   Result Value Ref Range    CK 135 26 - 458 U/L   METABOLIC PANEL, COMPREHENSIVE    Collection Time: 04/10/17 11:42 AM   Result Value Ref Range    Sodium 138 136 - 145 mmol/L    Potassium 4.3 3.5 - 5.1 mmol/L    Chloride 102 97 - 108 mmol/L    CO2 28 21 - 32 mmol/L    Anion gap 8 5 - 15 mmol/L    Glucose 103 (H) 65 - 100 mg/dL    BUN 15 6 - 20 MG/DL    Creatinine 0.73 0.55 - 1.02 MG/DL    BUN/Creatinine ratio 21 (H) 12 - 20      GFR est AA >60 >60 ml/min/1.44m    GFR est non-AA >60 >60 ml/min/1.743m   Calcium 8.6 8.5 - 10.1 MG/DL    Bilirubin, total 0.1 (L) 0.2 - 1.0 MG/DL    ALT (SGPT) 18 12 - 78 U/L    AST (SGOT) 19 15 - 37 U/L    Alk. phosphatase 188 (H) 45 - 117 U/L    Protein, total 7.3 6.4 - 8.2 g/dL    Albumin 3.7 3.5 - 5.0 g/dL    Globulin 3.6 2.0 - 4.0 g/dL    A-G Ratio 1.0 (L) 1.1 - 2.2     CBC W/O DIFF    Collection Time: 04/10/17 11:42 AM   Result Value Ref Range    WBC 5.7 3.6 - 11.0 K/uL    RBC 3.99 3.80 - 5.20 M/uL    HGB 9.2 (L) 11.5 - 16.0 g/dL    HCT 30.2 (L) 35.0 - 47.0 %    MCV 75.7 (L) 80.0 - 99.0 FL  MCH 23.1 (L) 26.0 - 34.0 PG    MCHC 30.5 30.0 - 36.5 g/dL    RDW 15.3 (H) 11.5 - 14.5 %    PLATELET 237 150 - 400 K/uL    MPV 8.6 (L) 8.9 - 12.9 FL    NRBC 0.0 0 PER 100 WBC    ABSOLUTE NRBC 0.00 0.00 - 0.01 K/uL   D DIMER    Collection Time: 04/10/17  1:00 PM   Result Value Ref Range    D-dimer 2.57 (H) 0.00 - 0.65 mg/L FEU

## 2017-04-10 NOTE — Other (Signed)
Bedside and Verbal shift change report given to Kemeka RN (oncoming nurse) by Rachael RN (offgoing nurse). Report included the following information SBAR, Kardex, Intake/Output, MAR and Recent Results.

## 2017-04-10 NOTE — Progress Notes (Signed)
Problem: Falls - Risk of  Goal: *Absence of Falls  Document Schmid Fall Risk and appropriate interventions in the flowsheet.  Fall Risk Interventions:

## 2017-04-10 NOTE — ED Notes (Signed)
Patient off floor to CT.

## 2017-04-10 NOTE — Progress Notes (Signed)
Problem: Falls - Risk of  Goal: *Absence of Falls  Document Schmid Fall Risk and appropriate interventions in the flowsheet.   Outcome: Progressing Towards Goal  Fall Risk Interventions:

## 2017-04-10 NOTE — ED Notes (Signed)
Hospitalist at bedside with patient.

## 2017-04-10 NOTE — ED Notes (Signed)
Assumed care of patient. Verbal report received from Alex RN.

## 2017-04-10 NOTE — ED Notes (Signed)
TRANSFER - OUT REPORT:    Verbal report given to Rachel RN(name) on Lindsay [name]  being transferred to Ortho(unit) for routine progression of care       Report consisted of patient???s Situation, Background, Assessment and   Recommendations(SBAR).     Information from the following report(s) SBAR was reviewed with the receiving nurse.    Lines:   Peripheral IV 04/10/17 Right Antecubital (Active)   Site Assessment Clean, dry, & intact 04/10/2017  2:11 PM   Phlebitis Assessment 0 04/10/2017  2:11 PM   Infiltration Assessment 0 04/10/2017  2:11 PM   Dressing Status Clean, dry, & intact 04/10/2017  2:11 PM   Dressing Type Transparent 04/10/2017  2:11 PM   Hub Color/Line Status Pink 04/10/2017  2:11 PM   Action Taken Blood drawn 04/10/2017  2:11 PM   Alcohol Cap Used No 04/10/2017  2:11 PM       Peripheral IV 04/10/17 Left Antecubital (Active)   Site Assessment Clean, dry, & intact 04/10/2017  2:12 PM   Phlebitis Assessment 0 04/10/2017  2:12 PM   Infiltration Assessment 0 04/10/2017  2:12 PM   Dressing Status Clean, dry, & intact 04/10/2017  2:12 PM   Dressing Type Transparent 04/10/2017  2:12 PM   Hub Color/Line Status Blue 04/10/2017  2:12 PM        Opportunity for questions and clarification was provided.      Patient transported with:   [employer]

## 2017-04-10 NOTE — Progress Notes (Signed)
ADULT PROTOCOL: JET AEROSOL ASSESSMENT    Patient  Lindsay BillingsKathy A Ferreri     61 y.o.   female     04/10/2017  10:55 PM    Breath Sounds Pre Procedure: Right Breath Sounds: Clear                               Left Breath Sounds: Clear    Breath Sounds Post Procedure: Right Breath Sounds: Clear                                 Left Breath Sounds: Clear    Breathing pattern: Pre procedure Breathing Pattern: Regular          Post procedure Breathing Pattern: Regular    Heart Rate: Pre procedure Pulse: 82           Post procedure Pulse: 88    Resp Rate: Pre procedure Respirations: 18           Post procedure Respirations: 18      Cough: Pre procedure Cough: Non-productive               Post procedure Cough: Non-productive    Suctioned: NO    Sputum: Pre procedure  NA                 Post procedure  NA    Oxygen: O2 Device: Room air        Changed: NO    SpO2: Pre procedure SpO2: 96 %   without oxygen              Post procedure SpO2: 98 %  without oxygen    Nebulizer Therapy: Current medications Aerosolized Medications: DuoNeb      Changed: NO    Smoking History: never    Problem List:   Patient Active Problem List   Diagnosis Code   ??? Depression F32.9   ??? Headache(784.0) R51   ??? Chest pain R07.9   ??? Pulmonary embolism (HCC) I26.99   ??? Dyslipidemia, goal LDL below 100 E78.5   ??? Anxiety F41.9   ??? Abnormal brain scan R94.02   ??? Primary osteoarthritis of left knee M17.12   ??? Primary osteoarthritis of right knee M17.11   ??? RLS (restless legs syndrome) G25.81   ??? Paresthesia of lower lip R20.2   ??? Respiratory failure, acute (HCC) J96.00   ??? Respiratory failure (HCC) J96.90   ??? Atelectasis J98.11       Respiratory Therapist: Roxann S Perdomo, RT

## 2017-04-10 NOTE — ED Notes (Signed)
Patient ambulated in halls. Patient complained of increased shortness of breath and that her head felt funny. Patient O2 saturations noted to have dropped to 87% twice. Upon returning to bed patient saturations 98% on RA. Patient respirations labored. MD notified. MD went to bedsdie to talk with patient

## 2017-04-11 LAB — EKG 12-LEAD
Atrial Rate: 58 {beats}/min
P Axis: 1 degrees
P-R Interval: 124 ms
Q-T Interval: 464 ms
QRS Duration: 78 ms
QTc Calculation (Bazett): 455 ms
R Axis: 73 degrees
T Axis: 70 degrees
Ventricular Rate: 58 {beats}/min

## 2017-04-11 LAB — METABOLIC PANEL, COMPREHENSIVE
A-G Ratio: 0.9 — ABNORMAL LOW (ref 1.1–2.2)
ALT (SGPT): 15 U/L (ref 12–78)
AST (SGOT): 13 U/L — ABNORMAL LOW (ref 15–37)
Albumin: 3.2 g/dL — ABNORMAL LOW (ref 3.5–5.0)
Alk. phosphatase: 159 U/L — ABNORMAL HIGH (ref 45–117)
Anion gap: 12 mmol/L (ref 5–15)
BUN/Creatinine ratio: 15 (ref 12–20)
BUN: 12 MG/DL (ref 6–20)
Bilirubin, total: 0.2 MG/DL (ref 0.2–1.0)
CO2: 23 mmol/L (ref 21–32)
Calcium: 8.4 MG/DL — ABNORMAL LOW (ref 8.5–10.1)
Chloride: 105 mmol/L (ref 97–108)
Creatinine: 0.8 MG/DL (ref 0.55–1.02)
GFR est AA: >60 mL/min/{1.73_m2} (ref >60)
GFR est non-AA: >60 mL/min/{1.73_m2} (ref >60)
Globulin: 3.6 g/dL (ref 2.0–4.0)
Glucose: 126 mg/dL — ABNORMAL HIGH (ref 65–100)
Potassium: 3.2 mmol/L — ABNORMAL LOW (ref 3.5–5.1)
Protein, total: 6.8 g/dL (ref 6.4–8.2)
Sodium: 140 mmol/L (ref 136–145)

## 2017-04-11 LAB — CBC WITH AUTOMATED DIFF
ABS. BASOPHILS: 0 10*3/uL (ref 0.0–0.1)
ABS. EOSINOPHILS: 0.1 10*3/uL (ref 0.0–0.4)
ABS. IMM. GRANS.: 0 10*3/uL (ref 0.00–0.04)
ABS. LYMPHOCYTES: 1.3 10*3/uL (ref 0.8–3.5)
ABS. MONOCYTES: 0.4 10*3/uL (ref 0.0–1.0)
ABS. NEUTROPHILS: 3.6 10*3/uL (ref 1.8–8.0)
ABSOLUTE NRBC: 0 10*3/uL (ref 0.00–0.01)
BASOPHILS: 1 % (ref 0–1)
EOSINOPHILS: 2 % (ref 0–7)
HCT: 27.6 % — ABNORMAL LOW (ref 35.0–47.0)
HGB: 8.3 g/dL — ABNORMAL LOW (ref 11.5–16.0)
IMMATURE GRANULOCYTES: 1 % — ABNORMAL HIGH (ref 0.0–0.5)
LYMPHOCYTES: 24 % (ref 12–49)
MCH: 23.1 PG — ABNORMAL LOW (ref 26.0–34.0)
MCHC: 30.1 g/dL (ref 30.0–36.5)
MCV: 76.9 FL — ABNORMAL LOW (ref 80.0–99.0)
MONOCYTES: 7 % (ref 5–13)
MPV: 8.6 FL — ABNORMAL LOW (ref 8.9–12.9)
NEUTROPHILS: 66 % (ref 32–75)
NRBC: 0 PER 100 WBC
PLATELET: 222 10*3/uL (ref 150–400)
RBC: 3.59 M/uL — ABNORMAL LOW (ref 3.80–5.20)
RDW: 15.4 % — ABNORMAL HIGH (ref 11.5–14.5)
WBC: 5.4 10*3/uL (ref 3.6–11.0)

## 2017-04-11 LAB — EKG, 12 LEAD, INITIAL
Atrial Rate: 58 {beats}/min
Calculated P Axis: 1 degrees
Calculated R Axis: 73 degrees
Calculated T Axis: 70 degrees
P-R Interval: 124 ms
Q-T Interval: 464 ms
QRS Duration: 78 ms
QTC Calculation (Bezet): 455 ms
Ventricular Rate: 58 {beats}/min

## 2017-04-11 LAB — SAMPLES BEING HELD: SAMPLES BEING HELD: 1

## 2017-04-11 LAB — TSH 3RD GENERATION: TSH: 5.06 u[IU]/mL — ABNORMAL HIGH (ref 0.36–3.74)

## 2017-04-11 LAB — MAGNESIUM: Magnesium: 2.1 mg/dL (ref 1.6–2.4)

## 2017-04-11 LAB — HEMOGLOBIN A1C WITH EAG
Est. average glucose: 114 mg/dL
Hemoglobin A1c: 5.6 % (ref 4.2–6.3)

## 2017-04-11 MED ORDER — POTASSIUM CHLORIDE SR 10 MEQ TAB
10 mEq | Freq: Once | ORAL | Status: AC
Start: 2017-04-11 — End: 2017-04-11
  Administered 2017-04-11: 13:00:00 via ORAL

## 2017-04-11 MED ORDER — LEVOFLOXACIN 500 MG TAB
500 mg | ORAL | Status: DC
Start: 2017-04-11 — End: 2017-04-11

## 2017-04-11 MED FILL — DIAZEPAM 5 MG TAB: 5 mg | ORAL | Qty: 2

## 2017-04-11 MED FILL — GUAIFENESIN 100 MG/5 ML ORAL LIQUID: 100 mg/5 mL | ORAL | Qty: 20

## 2017-04-11 MED FILL — LEXAPRO 10 MG TABLET: 10 mg | ORAL | Qty: 2

## 2017-04-11 MED FILL — PROTONIX 40 MG TABLET,DELAYED RELEASE: 40 mg | ORAL | Qty: 1

## 2017-04-11 MED FILL — FERROUS SULFATE 325 MG (65 MG ELEMENTAL IRON) TAB: 325 mg (65 mg iron) | ORAL | Qty: 1

## 2017-04-11 MED FILL — IPRATROPIUM-ALBUTEROL 2.5 MG-0.5 MG/3 ML NEB SOLUTION: 2.5 mg-0.5 mg/3 ml | RESPIRATORY_TRACT | Qty: 3

## 2017-04-11 MED FILL — ALTARUSSIN 100 MG/5 ML ORAL LIQUID: 100 mg/5 mL | ORAL | Qty: 20

## 2017-04-11 MED FILL — FLUARIX QUAD 2018-2019 (PF) 60 MCG (15 MCG X 4)/0.5 ML IM SYRINGE: 60 mcg (15 mcg x 4)/0.5 mL | INTRAMUSCULAR | Qty: 0.5

## 2017-04-11 MED FILL — OXYCODONE-ACETAMINOPHEN 5 MG-325 MG TAB: 5-325 mg | ORAL | Qty: 1

## 2017-04-11 MED FILL — POTASSIUM CHLORIDE SR 10 MEQ TAB: 10 mEq | ORAL | Qty: 4

## 2017-04-11 MED FILL — PRAVASTATIN 40 MG TAB: 40 mg | ORAL | Qty: 1

## 2017-04-11 MED FILL — ROPINIROLE 1 MG TAB: 1 mg | ORAL | Qty: 1

## 2017-04-11 MED FILL — CHILDREN'S ASPIRIN 81 MG CHEWABLE TABLET: 81 mg | ORAL | Qty: 1

## 2017-04-11 NOTE — Discharge Summary (Signed)
Discharge Summary by Ned Grace, MD at 04/11/17 1129                Author: Ned Grace, MD  Service: Internal Medicine  Author Type: Physician       Filed: 04/11/17 1134  Date of Service: 04/11/17 1129  Status: Addendum          Editor: Ned Grace, MD (Physician)          Related Notes: Original Note by Ned Grace, MD (Physician) filed at 04/11/17 1133               Hospitalist Discharge Summary      NAME: Lindsay [name]    DOB:  10-06-1955    MRN:  [id number]       DISCHARGE DIAGNOSIS:   Transient Acute Hypoxic Respiratory Failure POA of unclear origin   Hypokalemia   H/O PE/DVT   Restless Legs syndrome   Depression      CONSULTATIONS:   None      Follow Up:     Follow-up Information        Follow up With  Details  Comments  Contact Info             Clarene Critchley, MD  In 1 week  As needed, Call [facility], If symptoms worsen  [address] [facility]   [address]   [place] [address]   [phone]                Judye Bos, MD  Schedule an appointment as soon as possible for a visit in 1 month  Call [facility], As needed, If symptoms worsen  [address]   [address] [place] [address]   [phone]                   Procedures: see electronic medical records for all procedures/Xrays and details which were not copied into this note but were reviewed prior to creation of Plan.        Please follow-up tests/labs that are still pending:   1. None       PMH/SH reviewed - no change compared to H&P      DISCHARGE SUMMARY/HOSPITAL COURSE: for full details see H&P, daily progress notes, labs, consult notes. Briefly As Per HPI :    Lindsay [name] is a 61 y.o.  Caucasian female ??with PMHx significant for PE, arthritis, ans DVT, presents by EMS??to the ED with cc of worsening SOB x several weeks.  Pt endorses associated right calf pain, cough, chills, lightheadedness with sitting and standing,??and central chest pain which only presents with respiration. Pt denies any modifying factors. Pt states  that she has a Hx of similar sxs in the past which  were the result of PE and DVT. Pt explains that she was evaluated for similar sxs in the ED on 03/28/17 during which she has a negative CTA of the chest and she was discharged with Norco. Pt notes that her sxs resolved briefly, but they returned over the  past several days which prompted her to go to [facility] for further evaluation.??Pt reports that she was evaluated at [facility] for her sxs this morning during which she had a normal CBC, but she was instructed to come into the ED to rule out  a DVT or PE. Of note, pt reports that she was placed on 2L NC at [facility] due to her  decreased O2 saturation. Pt denies the use of anticoagulants, and she endorses a FHx of heart disease, HTN, and A Fib. Pt denies Hx of asthma. Pt specifically denies  HA.    At this time patient is lying in bed states that she is feeling SOB and her O2 sat dropped while ambulating, has some cough and night and had been feeling cold, denies N/V no diarrhea, no fever, no urinary symptoms, no other associated symptoms.   We were asked to admit for work up and evaluation of the above problems.       The patient's hospital course was complicated by:   Transient Acute Hypoxic Respiratory Failure POA of unclear origin   Hypokalemia   H/O PE/DVT   Restless Legs syndrome   Depression     Patient with complex inpatient course. Please see all notes,  labs, vitals, testing and procedures for details, briefly the patient was admitted following presentation to ED with sob. Patient with recent TTE in 6/18 was normal. No valvular dysfunction and a normal EF. Noted further this time was a normal CTA chest  shwoing no pulmonary process, no PE. She was on 1-2 L only. CXR normal. Minimal reflux, on PPI already. No chest pain, minimal cough, no phlegm. Now on RA making her own bed in the room! Safe for dc at this time. OP follow up PCP. We discussed her now  35# weight gain - she is attempting to lose  the weight. Suspect this is playing some role - maybe to reflux or her recent orthopedic procedures.   _______________________________________________________________________    Patient seen and examined by me on day of discharge.   Pertinent findings are:   Gen: wd obese f in nad   HEENT: ncat   Chest: ctab   Cv: no r/g   Abd: s/nd/nt +bs   Neuro: cn 2-12 gi      See Discharge Instructions for further details.   _______________________________________________________________________      Medications Reviewed:     Current Discharge Medication List              START taking these medications          Details        oxyCODONE-acetaminophen (PERCOCET) 5-325 mg per tablet  Take 1 Tab by mouth every eight (8) hours as needed for Pain. Max Daily Amount: 3 Tabs. Indications: Pain   Qty: 10 Tab, Refills:  0          Associated Diagnoses: Acute chest pain                     CONTINUE these medications which have NOT CHANGED          Details        !! pravastatin (PRAVACHOL) 40 mg tablet  TAKE 1 TABLET BY MOUTH EVERY NIGHT   Qty: 30 Tab, Refills:  0          Associated Diagnoses: Dyslipidemia, goal LDL below 100               rOPINIRole (REQUIP) 1 mg tablet  TAKE 4 TABLETS BY MOUTH 1 TO 3 HOURS BEFORE BEDTIME EVERYDAY   Qty: 120 Tab, Refills:  5               diazePAM (VALIUM) 5 mg tablet  Take 10 mg by mouth two (2) times a day. 5 mg every morning and 10 mg QHS   Refills: 5  gabapentin enacarbil (HORIZANT) 600 mg TbER  Take 1 Tab by mouth every evening.   Qty: 30 Tab, Refills:  11               aspirin 81 mg chewable tablet  Take 1 Tab by mouth daily.   Qty: 30 Tab, Refills:  0               ergocalciferol (ERGOCALCIFEROL) 50,000 unit capsule  Take 1 Cap by mouth every seven (7) days.   Qty: 12 Cap, Refills:  1          Associated Diagnoses: Vitamin deficiency               escitalopram oxalate (LEXAPRO) 20 mg tablet  Take 1 Tab by mouth daily.   Qty: 30 Tab, Refills:  5               omeprazole (PRILOSEC) 20 mg  capsule  Take 20 mg by mouth nightly.               !! pravastatin (PRAVACHOL) 40 mg tablet  1 po every day Please make a med check appointment in the next month. Thank you.   Qty: 30 Tab, Refills:  0          Associated Diagnoses: Dyslipidemia, goal LDL below 100               ferrous sulfate 325 mg (65 mg iron) tablet  Take 1 Tab by mouth daily (with breakfast).   Qty: 10 Tab, Refills:  0               !! - Potential duplicate medications found. Please discuss with provider.              STOP taking these medications                  HYDROcodone-acetaminophen (NORCO) 5-325 mg per tablet  Comments:    Reason for Stopping:                          _______________________________________________________________________      Risk of deterioration:  Moderate   ________________________________________________________________________      Disposition   Home with family, no needs   ________________________________________________________________________      Care Plan discussed with:    Patient, Family, RN, Care Manager, Consultant   ________________________________________________________________________      Code Status: Full Code   ________________________________________________________________________         Total time spent in discharge (min) :  35     ________________________________________________________________________      CDMP Checked: Yes      Signed: Ned GraceAdam K Tauni Sanks, MD      This note will not be viewable in MyChart.

## 2017-04-11 NOTE — Progress Notes (Signed)
04/11/17 1042   Bedside Spirometry   FEV-1/Actual(liters) 2.34 Liters   FEV-1/ Predicted (liters) 2.38 Liters   FVC (liters) 2.72 Liters   Patient Postioning Sitting up in chair   Patient Effort Well;Reproducable

## 2017-04-11 NOTE — Progress Notes (Signed)
Occupational Therapy EVALUATION/discharge  Patient: Lindsay [name] (61 y.o. female)  Date: 04/11/2017  Primary Diagnosis: Acute Respiratiory Failure        Precautions: none       ASSESSMENT:   Based on the objective data described below, the patient presents with intact strength, balance, activity tolerance and mentation, demonstrating independence with ADLs and functional mobility. Will complete OT orders. No therapy needs for after discharge as well.   Discharge Recommendations: None  Further Equipment Recommendations for Discharge: none          OBJECTIVE DATA SUMMARY:   HISTORY:   Past Medical History:   Diagnosis Date   ??? Abnormal brain scan    ??? Arthritis    ??? Chronic pain    ??? Depression    ??? DVT (deep venous thrombosis) (HCC)    ??? Falls frequently    ??? Fracture of left ankle    ??? Headache(784.0) 2011   ??? Ill-defined condition 09/15/2015    FX R HAND   ??? Other and unspecified hyperlipidemia 12/27/2012   ??? PE (pulmonary embolism) 12/2012   ??? RLS (restless legs syndrome) 03/16/2016    Dr Scarlett Presto   ??? TIA (transient ischemic attack) 03/2016    Right facial droop, numbness in lip   ??? Torn rotator cuff     right     Past Surgical History:   Procedure Laterality Date   ??? BREAST SURGERY PROCEDURE UNLISTED  2002    AUGMENTATION   ??? HX APPENDECTOMY     ??? HX CESAREAN SECTION      x4   ??? HX GI      COLONOSCOPY   ??? 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 HX ORTHOPAEDIC  08/2004    rotater cuff r. shoulder   ??? HX ORTHOPAEDIC      left ankle fracture treated with air cast   ??? HX ORTHOPAEDIC  6/13    left shoulder fxt and dislocation   ??? HX OTHER SURGICAL  2010    left foot torn tendon   ??? HX TUBAL LIGATION     ??? IMPLANT BREAST SILICONE/EQ  2002    Bilateral breast implants       Prior Level of Function/Environment/Context: independent with ADLs, IADLs and functional mobility.   Occupations in which the patient is/was successful, what are the barriers preventing that success:    Performance Patterns (routines, roles, habits, and rituals):   Personal Interests and/or values:   Expanded or extensive additional review of patient history:     Home Situation  Home Environment: Private residence  One/Two Story Residence: Other (Comment)  Living Alone: No  Support Systems: Family member(s)  Patient Expects to be Discharged to:: Private residence  Current DME Used/Available at Home: None    Hand dominance: Right    EXAMINATION OF PERFORMANCE DEFICITS:  Cognitive/Behavioral Status:  Neurologic State: Alert  Orientation Level: Oriented X4  Cognition: Appropriate decision making;Appropriate safety awareness;Appropriate for age attention/concentration;Follows commands      Hearing:  Auditory  Auditory Impairment: None    Vision/Perceptual:                           Acuity: Within Defined Limits         Range of Motion:  AROM: Within functional limits  Strength:*Strength: Within functional limits                Coordination:  Coordination: Within functional limits            Tone & Sensation:  Tone: Normal  Sensation: Intact                      Balance:  Sitting: Intact  Standing: Intact    Functional Mobility and Transfers for ADLs:  Bed Mobility:  Rolling: Independent  Supine to Sit: Independent  Scooting: Independent    Transfers:  Sit to Stand: Independent  Stand to Sit: Independent  Bed to Chair: Independent  Bathroom Mobility: Independent  StatisticianToilet Transfer : Independent    ADL Assessment:  Feeding: Independent    Oral Facial Hygiene/Grooming: Independent    Bathing: Independent    Upper Body Dressing: Independent    Lower Body Dressing: Independent    Toileting: Independent              Functional Measure:  Barthel Index:    Bathing: 5  Bladder: 10  Bowels: 10  Grooming: 5  Dressing: 10  Feeding: 10  Mobility: 15  Stairs: 10  Toilet Use: 10  Transfer (Bed to Chair and Back): 15  Total: 100       Barthel and G-code impairment scale:  Percentage of impairment CH  0% CI   1-19% CJ  20-39% CK  40-59% CL  60-79% CM  80-99% CN  100%   Barthel Score 0-100 100 99-80 79-60 59-40 20-39 1-19   0   Barthel Score 0-20 20 17-19 13-16 9-12 5-8 1-4 0      The Barthel ADL Index: Guidelines  1. The index should be used as a record of what a patient does, not as a record of what a patient could do.  2. The main aim is to establish degree of independence from any help, physical or verbal, however minor and for whatever reason.  3. The need for supervision renders the patient not independent.  4. A patient's performance should be established using the best available evidence. Asking the patient, friends/relatives and nurses are the usual sources, but direct observation and common sense are also important. However direct testing is not needed.  5. Usually the patient's performance over the preceding 24-48 hours is important, but occasionally longer periods will be relevant.  6. Middle categories imply that the patient supplies over 50 per cent of the effort.  7. Use of aids to be independent is allowed.    Clarisa KindredMahoney, F.l., Barthel, D.W. [phone](1965). Functional evaluation: the Barthel Index. Md State Med J (14)2.  Zenaida NieceVan der Makemie ParkPutten, J.J.M.F, WatsonHobart, Ian MalkinJ.C., Margret ChanceFreeman, J.A., [place]pson, [place].J. (1999). Measuring the change indisability after inpatient rehabilitation; comparison of the responsiveness of the Barthel Index and Functional Independence Measure. Journal of Neurology, Neurosurgery, and Psychiatry, 66(4), 2155433400480-484.  Dawson BillsVan Exel, N.J.A, Scholte op [place]. JohnsReimer,  W.J.M, & Koopmanschap, M.A. (2004.) Assessment of post-stroke quality of life in cost-effectiveness studies: The usefulness of the Barthel Index and the EuroQoL-5D. Quality of Life Research, 13, 811-91427-43       G codes:  In compliance with CMS???s Claims Based Outcome Reporting, the following G-code set was chosen for this patient based on their primary functional limitation being treated:    The outcome measure chosen to determine the severity of the functional  limitation was the Barthel Index with a score of 100/100 which was correlated with the impairment scale.    ?  Self Care:    [phone] - CURRENT STATUS: CH - 0% impaired, limited or restricted   U0454 - GOAL STATUS: CH - 0% impaired, limited or restricted   U9811 - D/C STATUS:  CH - 0% impaired, limited or restricted     Pain:  Pain Scale 1: Numeric (0 - 10)  Pain Intensity 1: 3  Pain Location 1: Head;Leg     Pain Description 1: Aching  Pain Intervention(s) 1: Medication (see MAR)  Activity Tolerance:   VSS    After treatment:     Patient left in no apparent distress sitting up in chair    Patient left in no apparent distress in bed    Call bell left within reach    Nursing notified    Caregiver present    Bed alarm activated    COMMUNICATION/EDUCATION:   Communication/Collaboration:        Home safety education was provided and the patient/caregiver indicated understanding.        Patient/family have participated as able and agree with findings and recommendations.        Patient is unable to participate in plan of care at this time.  Findings and recommendations were discussed with: Registered Nurse    Michel Harrow, OTR/L  Time Calculation: 15 mins

## 2017-04-11 NOTE — Progress Notes (Signed)
Attempted to make follow up appointment with PCP but had to leave a voice message. Waiting for a call back to schedule hospital follow up.

## 2017-04-11 NOTE — Telephone Encounter (Signed)
Bone density done 8/23 is available for PCP to review in the system.

## 2017-04-11 NOTE — Progress Notes (Signed)
1230 I have reviewed discharge instructions with the patient.  The patient's questions were answered and she verbalized understanding. IVs removed. Care terminated at this time

## 2017-04-11 NOTE — Progress Notes (Signed)
Reason for Admission:   Acute respiratory failure                    RRAT Score: 7 LOW                   Plan for utilizing home health:  Per recommendation                     Likelihood of Readmission:  Low per acuity. Pt has a strong support system. No problems financially or accessing medications. Pt has no concerns for discharge at this time.     Transition of Care Plan:                 Pt is a 61 year old, caucasian female, admitted with  Acute respiratory failure. Pt states she lives with her husband in a 2 level home with a basement. Pt states there are 4 steps to enter and 13 to get up to the masterbedroom. Pt states after her knee replacement she has not been able to go upstairs much and sleeps downstairs. Pt has a walker and cane accessible to her from the knee surgery but does not use them. Pt does drive at baseline. Pt has [facility] and would go with them again if needed. Pt has not been to a SNF in the past. Pt's [facility] [address] At time of discharge pt dtr will be driving her home via private vehicle. Pt has no advanced directives but is working on them with her family. Pt has no financial concerns or problems accessing medications. Pt has no questions or concerns regarding discharge. No further CM needs identified.     Care Management Interventions  PCP Verified by CM: Yes  Mode of Transport at Discharge: Self  Transition of Care Consult (CM Consult): Discharge Planning  MyChart Signup: No  Discharge Durable Medical Equipment: No  Health Maintenance Reviewed: Yes  Physical Therapy Consult: Yes  Occupational Therapy Consult: Yes  Speech Therapy Consult: No  Current Support Network: Lives with Spouse, Own Home  Confirm Follow Up Transport: Self  Plan discussed with Pt/Family/Caregiver: Yes  Discharge Location  Discharge Placement: Home with family assistance    Hyman BowerMary Guarino, MSW, CM   [facility] Care Manager   [phone]

## 2017-04-11 NOTE — Discharge Summary (Addendum)
Hospitalist Discharge Summary    NAME: Lindsay [name]   DOB:  03/25/1956   MRN:  [id number]     DISCHARGE DIAGNOSIS:  Transient Acute Hypoxic Respiratory Failure POA of unclear origin  Hypokalemia  H/O PE/DVT  Restless Legs syndrome  Depression    CONSULTATIONS:  None    Follow Up:  Follow-up Information     Follow up With Details Comments Contact Info    Clarene Critchley, MD In 1 week As needed, Call Sooner, If symptoms worsen [address] [facility]  [address]  [place] [address]  [phone]      Judye Bos, MD Schedule an appointment as soon as possible for a visit in 1 month Call Sooner, As needed, If symptoms worsen [address]  Suite [place] [address]  [phone]            Procedures: see electronic medical records for all procedures/Xrays and details which were not copied into this note but were reviewed prior to creation of Plan.      Please follow-up tests/labs that are still pending:  1. None     PMH/SH reviewed - no change compared to H&P    DISCHARGE SUMMARY/HOSPITAL COURSE: for full details see H&P, daily progress notes, labs, consult notes. Briefly As Per HPI:   Lindsay [name] is a 61 y.o.  Caucasian female ??with PMHx significant for PE, arthritis, ans DVT, presents by EMS??to the ED with cc of worsening SOB x several weeks. Pt endorses associated right calf pain, cough, chills, lightheadedness with sitting and standing,??and central chest pain which only presents with respiration. Pt denies any modifying factors. Pt states that she has a Hx of similar sxs in the past which were the result of PE and DVT. Pt explains that she was evaluated for similar sxs in the ED on 03/28/17 during which she has a negative CTA of the chest and she was discharged with Norco. Pt notes that her sxs resolved briefly, but they returned over the past several days which prompted her to go to [facility] for further evaluation.??Pt reports that she was evaluated at [facility] for her sxs this morning during which she had a normal CBC, but she was instructed to come into the ED to rule out a DVT or PE. Of note, pt reports that she was placed on 2L NC at [facility] due to her decreased O2 saturation. Pt denies the use of anticoagulants, and she endorses a FHx of heart disease, HTN, and A Fib. Pt denies Hx of asthma. Pt specifically denies HA.   At this time patient is lying in bed states that she is feeling SOB and her O2 sat dropped while ambulating, has some cough and night and had been feeling cold, denies N/V no diarrhea, no fever, no urinary symptoms, no other associated symptoms.  We were asked to admit for work up and evaluation of the above problems.     The patient's hospital course was complicated by:  Transient Acute Hypoxic Respiratory Failure POA of unclear origin  Hypokalemia  H/O PE/DVT  Restless Legs syndrome  Depression    Patient with complex inpatient course. Please see all notes, labs, vitals, testing and procedures for details, briefly the patient was admitted following presentation to ED with sob. Patient with recent TTE in 6/18 was normal. No valvular dysfunction and a normal EF. Noted further this time was a normal CTA chest shwoing no pulmonary process, no PE. She  was on 1-2 L only. CXR normal. Minimal reflux, on PPI already. No chest pain, minimal cough, no phlegm. Now on RA making her own bed in the room! Safe for dc at this time. OP follow up PCP. We discussed her now 35# weight gain - she is attempting to lose the weight. Suspect this is playing some role - maybe to reflux or her recent orthopedic procedures.  _______________________________________________________________________   Patient seen and examined by me on day of discharge.  Pertinent findings are:  Gen: wd obese f in nad  HEENT: ncat  Chest: ctab  Cv: no r/g  Abd: s/nd/nt +bs  Neuro: cn 2-12 gi    See Discharge Instructions for further details.   _______________________________________________________________________    Medications Reviewed:  Current Discharge Medication List      START taking these medications    Details   oxyCODONE-acetaminophen (PERCOCET) 5-325 mg per tablet Take 1 Tab by mouth every eight (8) hours as needed for Pain. Max Daily Amount: 3 Tabs. Indications: Pain  Qty: 10 Tab, Refills: 0    Associated Diagnoses: Acute chest pain         CONTINUE these medications which have NOT CHANGED    Details   !! pravastatin (PRAVACHOL) 40 mg tablet TAKE 1 TABLET BY MOUTH EVERY NIGHT  Qty: 30 Tab, Refills: 0    Associated Diagnoses: Dyslipidemia, goal LDL below 100      rOPINIRole (REQUIP) 1 mg tablet TAKE 4 TABLETS BY MOUTH 1 TO 3 HOURS BEFORE BEDTIME EVERYDAY  Qty: 120 Tab, Refills: 5      diazePAM (VALIUM) 5 mg tablet Take 10 mg by mouth two (2) times a day. 5 mg every morning and 10 mg QHS  Refills: 5      gabapentin enacarbil (HORIZANT) 600 mg TbER Take 1 Tab by mouth every evening.  Qty: 30 Tab, Refills: 11      aspirin 81 mg chewable tablet Take 1 Tab by mouth daily.  Qty: 30 Tab, Refills: 0      ergocalciferol (ERGOCALCIFEROL) 50,000 unit capsule Take 1 Cap by mouth every seven (7) days.  Qty: 12 Cap, Refills: 1    Associated Diagnoses: Vitamin deficiency      escitalopram oxalate (LEXAPRO) 20 mg tablet Take 1 Tab by mouth daily.  Qty: 30 Tab, Refills: 5      omeprazole (PRILOSEC) 20 mg capsule Take 20 mg by mouth nightly.      !! pravastatin (PRAVACHOL) 40 mg tablet 1 po every day Please make a med check appointment in the next month. Thank you.  Qty: 30 Tab, Refills: 0    Associated Diagnoses: Dyslipidemia, goal LDL below 100      ferrous sulfate 325 mg (65 mg iron) tablet Take 1 Tab by mouth daily (with breakfast).  Qty: 10 Tab, Refills: 0       !! - Potential duplicate medications found. Please discuss with provider.      STOP taking these medications       HYDROcodone-acetaminophen (NORCO) 5-325 mg per tablet Comments:    Reason for Stopping:             _______________________________________________________________________    Risk of deterioration: Moderate  ________________________________________________________________________    Disposition  Home with family, no needs  ________________________________________________________________________    Care Plan discussed with:   Patient, Family, RN, Care Manager, Consultant  ________________________________________________________________________    Code Status: Full Code  ________________________________________________________________________    Total time spent in discharge (min): 35  ________________________________________________________________________    CDMP Checked: Yes    Signed: Modesto Charon, MD    This note will not be viewable in [place].

## 2017-04-11 NOTE — Progress Notes (Signed)
04/11/2017 Message left for patient to schedule follow up appointment with Dr. Tonny Bollman Tuesday at 3:15 . Dr. Darl Pikes is out of the office. PAC

## 2017-04-11 NOTE — Progress Notes (Signed)
Patient seen in room. She was up making her bed and walking without difficulty. She feels she is at her baseline level of independent mobility and does not need PT at this time.  PT will not need to see her. Recommend home without need for further PT services upon discharge.    Alba Cory, PT

## 2017-04-12 ENCOUNTER — Ambulatory Visit: Admit: 2017-04-12 | Payer: PRIVATE HEALTH INSURANCE | Attending: Internal Medicine | Primary: Internal Medicine

## 2017-04-12 DIAGNOSIS — Z09 Encounter for follow-up examination after completed treatment for conditions other than malignant neoplasm: Secondary | ICD-10-CM

## 2017-04-12 LAB — ANA QL, W/REFLEX CASCADE
ANA DIRECT, 165188: NEGATIVE
ANA Direct: NEGATIVE

## 2017-04-12 NOTE — Progress Notes (Signed)
Pt returned call & agreed to see Dr. Tonny Bollmanavussin today per hospital Case Manager's request.     Appt scheduled after speaking with NN. Pt is currently not seeing Pulmonary. Pt would like a referral to a [facility] [place] cardiologist as well. Will discuss at f/u appt.

## 2017-04-12 NOTE — Progress Notes (Signed)
Patient on discharge report dated [facility]17 [facility]. Admission dates: 04/10/17 - 04/11/17.     DISCHARGE DIAGNOSIS:  Transient Acute Hypoxic Respiratory Failure POA of unclear origin  Hypokalemia  H/O PE/DVT  Restless Legs syndrome  Depression    Left message on voicemail to make f/u appt today or tomorrow with a provider in the office; PCP off.  Will attempt to contact again.  Need to complete post-discharge assessment.

## 2017-04-12 NOTE — Progress Notes (Signed)
Lindsay [name] is a 61 y.o. female who presents today for Hospital Follow Up and Respiratory Distress  .      She has a history of   Patient Active Problem List   Diagnosis Code   ??? Depression F32.9   ??? Headache(784.0) R51   ??? Chest pain R07.9   ??? Pulmonary embolism (HCC) I26.99   ??? Dyslipidemia, goal LDL below 100 E78.5   ??? Anxiety F41.9   ??? Abnormal brain scan R94.02   ??? Primary osteoarthritis of left knee M17.12   ??? Primary osteoarthritis of right knee M17.11   ??? RLS (restless legs syndrome) G25.81   ??? Paresthesia of lower lip R20.2   ??? Respiratory failure, acute (HCC) J96.00   ??? Respiratory failure (HCC) J96.90   ??? Atelectasis J98.11   .    Today patient is here for f/u.    Hypoxemic Respiratory Failure: Hospitalized from 10/7-10/8 notes that she has been having SOB and oxygen desaturation. NN has been in touch and has facilitated this f/u.    Seen at pt first due to SOB and fatigue. Sats noted to be in upper 80's.   In the ER noted to have sats drop to 87% with ambulation. Blood work and imaging ([place], head CT and CTA) negative with the exception of Hgb (but stable for some months). ECHO WNL several months ago and PFT's unremarkable.    Hospitalist with concern for linear atelectasis and PNA, plan was for abx, but never started.    Of notes I do not see any ABG's.   Denies any CP or SOB since.   Denies excessive opiate use.     Today feels frustrated over negative w/u. No CP or SOB.     ROS  Review of Systems   Constitutional: Negative for chills, fever and weight loss.   Eyes: Negative for blurred vision, double vision, photophobia and pain.   Respiratory: Negative for cough and shortness of breath (Resolved).    Cardiovascular: Negative for chest pain, palpitations and leg swelling.   Gastrointestinal: Negative for abdominal pain, diarrhea, heartburn, nausea and vomiting.   Neurological: Negative.    Endo/Heme/Allergies: Does not bruise/bleed easily.    Psychiatric/Behavioral: Negative for depression. The patient is not nervous/anxious.        Visit Vitals   ??? BP 103/68 (BP 1 Location: Left arm, BP Patient Position: Sitting)   ??? Pulse 96   ??? Temp 98.6 ??F (37 ??C) (Oral)   ??? Resp 16   ??? Ht  (1.6 m)   ??? Wt 188 lb (85.3 kg)   ??? SpO2 91%   ??? BMI 33.3 kg/m2       Physical Exam   Constitutional: She is oriented to person, place, and time. She appears well-developed and well-nourished.   HENT:   Head: Normocephalic and atraumatic.   Cardiovascular: Normal rate and regular rhythm.    No murmur heard.  Pulmonary/Chest: Effort normal. No respiratory distress. She has no wheezes.   Good air movement.    Abdominal: Soft. Bowel sounds are normal. She exhibits no distension.   Musculoskeletal: Normal range of motion. She exhibits no edema.   Neurological: She is alert and oriented to person, place, and time.   Skin: Skin is warm and dry.   Psychiatric: She has a normal mood and affect. Her behavior is normal.   Walked pt down hallway and lowest O2 sat was 93%.       Current Outpatient Prescriptions  Medication Sig   ??? pravastatin (PRAVACHOL) 40 mg tablet TAKE 1 TABLET BY MOUTH EVERY NIGHT   ??? rOPINIRole (REQUIP) 1 mg tablet TAKE 4 TABLETS BY MOUTH 1 TO 3 HOURS BEFORE BEDTIME EVERYDAY   ??? pravastatin (PRAVACHOL) 40 mg tablet 1 po every day Please make a med check appointment in the next month. Thank you.   ??? ferrous sulfate 325 mg (65 mg iron) tablet Take 1 Tab by mouth daily (with breakfast).   ??? diazePAM (VALIUM) 5 mg tablet Take 10 mg by mouth two (2) times a day. 5 mg every morning and 10 mg QHS   ??? gabapentin enacarbil (HORIZANT) 600 mg TbER Take 1 Tab by mouth every evening.   ??? aspirin 81 mg chewable tablet Take 1 Tab by mouth daily.   ??? ergocalciferol (ERGOCALCIFEROL) 50,000 unit capsule Take 1 Cap by mouth every seven (7) days.   ??? escitalopram oxalate (LEXAPRO) 20 mg tablet Take 1 Tab by mouth daily.    ??? omeprazole (PRILOSEC) 20 mg capsule Take 20 mg by mouth nightly.   ??? buPROPion XL (WELLBUTRIN XL) 150 mg tablet TK 1 T PO  D.   ??? oxyCODONE-acetaminophen (PERCOCET) 5-325 mg per tablet Take 1 Tab by mouth every eight (8) hours as needed for Pain. Max Daily Amount: 3 Tabs. Indications: Pain     No current facility-administered medications for this visit.         Past Medical History:   Diagnosis Date   ??? Abnormal brain scan    ??? Arthritis    ??? Chronic pain    ??? Depression    ??? DVT (deep venous thrombosis) (HCC)    ??? Falls frequently    ??? Fracture of left ankle    ??? Headache(784.0) 2011   ??? Ill-defined condition 09/15/2015    FX R HAND   ??? Other and unspecified hyperlipidemia 12/27/2012   ??? PE (pulmonary embolism) 12/2012   ??? RLS (restless legs syndrome) 03/16/2016    Dr Scarlett Presto   ??? TIA (transient ischemic attack) 03/2016    Right facial droop, numbness in lip   ??? Torn rotator cuff     right      Past Surgical History:   Procedure Laterality Date   ??? BREAST SURGERY PROCEDURE UNLISTED  2002    AUGMENTATION   ??? HX APPENDECTOMY     ??? HX CESAREAN SECTION      x4   ??? HX GI      COLONOSCOPY   ??? 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 HX ORTHOPAEDIC  08/2004    rotater cuff r. shoulder   ??? HX ORTHOPAEDIC      left ankle fracture treated with air cast   ??? HX ORTHOPAEDIC  6/13    left shoulder fxt and dislocation   ??? HX OTHER SURGICAL  2010    left foot torn tendon   ??? HX TUBAL LIGATION     ??? IMPLANT BREAST SILICONE/EQ  2002    Bilateral breast implants      Social History   Substance Use Topics   ??? Smoking status: Never Smoker   ??? Smokeless tobacco: Never Used   ??? Alcohol use No      Comment: Pt denies      Family History   Problem Relation Age of Onset   ??? Heart Disease Mother    ??? Heart Disease Father    ??? Cancer Father      thyroid  cancer   ??? Arthritis-rheumatoid Sister    ??? No Known Problems Son    ??? No Known Problems Daughter    ??? No Known Problems Daughter    ??? No Known Problems Daughter     ??? Anesth Problems Neg Hx         No Known Allergies     Assessment/Plan  Diagnoses and all orders for this visit:    1. Hospital discharge follow-up - Recurrent O2 sat drops. Some mild SOB. VTE r/o, labs stable/unremarkable. Suggest sleep study for evaluation. Provided with PA of R contact. Discussed that O2 sensors can be innacurate, would like to see ABG while desating.   Pt does report frustration and notes she may not see pulmonary.   Suggest 4 week f/u with PCP for repeat CBC testing.     2. Acute respiratory failure with hypoxia (HCC)  -     REFERRAL TO SLEEP STUDIES    3. SOB (shortness of breath)  -     REFERRAL TO SLEEP STUDIES    4. History of pulmonary embolism        Follow-up Disposition: Not on File    Cordella RegisterJeremy Brack Shaddock, MD  04/12/2017

## 2017-04-13 LAB — ANTI-NEUTROPHIL CYTOPLASMIC AB
ATYPICAL PANCA: <1:20 {titer}
Atypical pANCA: <1:20 {titer}
Cytoplasmic (C-ANCA) Ab: <1:20 {titer}
Cytoplasmic (C-ANCA) Ab: <1:20 {titer}
PERINUCLEAR (P-ANCA), 162401: <1:20 {titer}
Perinuclear (P-ANCA): <1:20 {titer}

## 2017-04-13 NOTE — Progress Notes (Signed)
Pt noted in for f/u with Dr. Tonny Bollman on 04/12/17. Suggest 4 week f/u with PCP for repeat CBC testing in addition to sleep study. Pt voiced frustration & may not f/u with pulmonary.     Call placed to pt, no answer. VM left to call office to schedule f/u appt.

## 2017-05-01 ENCOUNTER — Encounter

## 2017-05-02 MED ORDER — PRAVASTATIN 40 MG TAB
40 mg | ORAL_TABLET | ORAL | 3 refills | Status: DC
Start: 2017-05-02 — End: 2017-05-23

## 2017-05-23 ENCOUNTER — Ambulatory Visit
Admit: 2017-05-23 | Discharge: 2017-05-23 | Payer: PRIVATE HEALTH INSURANCE | Attending: Neurology | Primary: Internal Medicine

## 2017-05-23 ENCOUNTER — Encounter: Attending: Neurology | Primary: Internal Medicine

## 2017-05-23 ENCOUNTER — Ambulatory Visit: Attending: Neurology | Primary: Internal Medicine

## 2017-05-23 DIAGNOSIS — G2581 Restless legs syndrome: Secondary | ICD-10-CM

## 2017-05-23 MED ORDER — GABAPENTIN ENACARBIL ER 600 MG TABLET,EXTENDED RELEASE
600 mg | ORAL_TABLET | Freq: Every evening | ORAL | 11 refills | Status: DC
Start: 2017-05-23 — End: 2017-06-13

## 2017-05-23 NOTE — Progress Notes (Signed)
Lindsay [name] is here to follow up RLS. She reports that she still has to get out of bed and walk several nights a week. She also reports at times she becomes confused and experiences memory loss.

## 2017-05-23 NOTE — Patient Instructions (Signed)
A Healthy Lifestyle: Care Instructions  Your Care Instructions    A healthy lifestyle can help you feel good, stay at a healthy weight, and have plenty of energy for both work and play. A healthy lifestyle is something you can share with your whole family.  A healthy lifestyle also can lower your risk for serious health problems, such as high blood pressure, heart disease, and diabetes.  You can follow a few steps listed below to improve your health and the health of your family.  Follow-up care is a key part of your treatment and safety. Be sure to make and go to all appointments, and call your doctor if you are having problems. It's also a good idea to know your test results and keep a list of the medicines you take.  How can you care for yourself at home?  ?? Do not eat too much sugar, fat, or fast foods. You can still have dessert and treats now and then. The goal is moderation.  ?? Start small to improve your eating habits. Pay attention to portion sizes, drink less juice and soda pop, and eat more fruits and vegetables.  ? Eat a healthy amount of food. A 3-ounce serving of meat, for example, is about the size of a deck of cards. Fill the rest of your plate with vegetables and whole grains.  ? Limit the amount of soda and sports drinks you have every day. Drink more water when you are thirsty.  ? Eat at least 5 servings of fruits and vegetables every day. It may seem like a lot, but it is not hard to reach this goal. A serving or helping is 1 piece of fruit, 1 cup of vegetables, or 2 cups of leafy, raw vegetables. Have an apple or some carrot sticks as an afternoon snack instead of a candy bar. Try to have fruits and/or vegetables at every meal.  ?? Make exercise part of your daily routine. You may want to start with simple activities, such as walking, bicycling, or slow swimming. Try to be active 30 to 60 minutes every day. You do not need to do all 30 to 60  minutes all at once. For example, you can exercise 3 times a day for 10 or 20 minutes. Moderate exercise is safe for most people, but it is always a good idea to talk to your doctor before starting an exercise program.  ?? Keep moving. Mow the lawn, work in the garden, or clean your house. Take the stairs instead of the elevator at work.  ?? If you smoke, quit. People who smoke have an increased risk for heart attack, stroke, cancer, and other lung illnesses. Quitting is hard, but there are ways to boost your chance of quitting tobacco for good.  ? Use nicotine gum, patches, or lozenges.  ? Ask your doctor about stop-smoking programs and medicines.  ? Keep trying.  In addition to reducing your risk of diseases in the future, you will notice some benefits soon after you stop using tobacco. If you have shortness of breath or asthma symptoms, they will likely get better within a few weeks after you quit.  ?? Limit how much alcohol you drink. Moderate amounts of alcohol (up to 2 drinks a day for men, 1 drink a day for women) are okay. But drinking too much can lead to liver problems, high blood pressure, and other health problems.  Family health  If you have a family, there are many things you   can do together to improve your health.  ?? Eat meals together as a family as often as possible.  ?? Eat healthy foods. This includes fruits, vegetables, lean meats and dairy, and whole grains.  ?? Include your family in your fitness plan. Most people think of activities such as jogging or tennis as the way to fitness, but there are many ways you and your family can be more active. Anything that makes you breathe hard and gets your heart pumping is exercise. Here are some tips:  ? Walk to do errands or to take your child to school or the bus.  ? Go for a family bike ride after dinner instead of watching TV.  Where can you learn more?  Go to http://www.healthwise.net/[employer].   Enter U807 in the search box to learn more about "A Healthy Lifestyle: Care Instructions."  Current as of: June 10, 2016  Content Version: 11.8  ?? 2006-2018 [employer]. Care instructions adapted under license by [employer] (which disclaims liability or warranty for this information). If you have questions about a medical condition or this instruction, always ask your healthcare professional. [employer] disclaims any warranty or liability for your use of this information.

## 2017-05-23 NOTE — Progress Notes (Signed)
Neurology Progress Note    HISTORY PROVIDED BY: patient and daughter    Chief Complaint:   Chief Complaint   Patient presents with   ??? Neurologic Problem      Subjective:   Pt is a 61 y.o. right handed female last seen in clinic on 02/07/17 in f/u for with h/o radiographic changes c/w MS, but no clinical symptoms, last MRI brain 03/27/16 was stable. At last visit had c/o worsened balance. Additionally, has RLS, with an ill-defined irritating sensation in her legs with urge to move her legs beginning around 6PM and keeping her awake and inhibiting her activities, though question if there may be some component of psych issues contributing to her need to walk. Exam revealed bilateral rotary nystagmus worse at end gaze, stable right ptosis, o/w unremarkable. Given new rotary nystagmus and complaints of imbalance, recommended MRI brain w/wo contrast to assess for new demyelinating lesion, stroke, or mass in brainstem/posterior fossa. Continued Horizant 624m qPM and increased to Requip 414mqhs.    She returns for f/u.  MRI brain w/wo contrast 02/17/17 was stable. Changes felt c/w CIWM changes, not MS.  She was admitted to the hospital in October, CT head in hospital was stable, but tells me some physician told her that she was going develop dementia based on the CT changes. She was noted to have drops in O2 sats, but work up was unrevealing. Her PCP referred her to sleep medicine. Pt mentions not being given her meds until 10PM at night in the hospital causing her to not sleep all night, up walking around the room due to RLS.   She has been out of Horizant for the last 10 days, she did not call for a refill.  She goes on with tangential history, c/o pain in right periorbital region for the last week, mentions while in hospital, all of her sxs were on right side, mentions right hand trembling, mentions going into a room and not remembering why she went into a room and on Friday night, she just felt "confused."  Notes she has  been to the hospital three times and nothing wrong has been found and she is very frustrated by this, noting she is "SOB all of the time", but talks continuously for 5 minutes straight without any difficulty. I point this out to her, and she still claims to be SOB b/c her voice is hoarse. I suggested her voice is fatigued, but she is not SOB. She then states, "well I am SOB when I try to do anything, like walk up stairs, why is that happening?" I point out that she has a BMI of 32.8 and does not exercise regularly and therefore she is out of shape. She does acknowledge wt gain and inactivity.  After complaining that no one has found the cause of her SOB, I mention the sleep study her PCP ordered, and she brushes this off and tells me she doesn't see the point in it.  She goes on about cognitive impairment, yet provides an amazingly detailed history of events of her life.  Mentions feeling off balance at night, so her husband is staying with her. Becomes tearful throughout the visit.  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  Notes they are being investigated by the IRS and tax papers are throughout the house, not wanting to put them away until the investigation is completed.      Past Medical History:   Diagnosis Date   ??? Abnormal brain scan    ??? Arthritis    ??? Chronic pain    ??? Depression    ??? DVT (deep venous thrombosis) ([place])    ??? Falls frequently    ??? Fracture of left ankle    ??? Headache(784.0) 2011   ??? Ill-defined condition 09/15/2015    FX R HAND   ??? Other and unspecified hyperlipidemia 12/27/2012   ??? PE (pulmonary embolism) 12/2012   ??? RLS (restless legs syndrome) 03/16/2016     Dr Roseanne Reno   ??? TIA (transient ischemic attack) 03/2016    Right facial droop, numbness in lip   ??? Torn rotator cuff     right      Past Surgical History:   Procedure Laterality Date   ??? BREAST SURGERY PROCEDURE UNLISTED  2002    AUGMENTATION   ??? HX APPENDECTOMY     ??? HX CESAREAN SECTION      x4   ??? HX GI      COLONOSCOPY   ??? 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 HX ORTHOPAEDIC  08/2004    rotater cuff r. shoulder   ??? HX ORTHOPAEDIC      left ankle fracture treated with air cast   ??? HX ORTHOPAEDIC  6/13    left shoulder fxt and dislocation   ??? HX OTHER SURGICAL  2010    left foot torn tendon   ??? HX TUBAL LIGATION     ??? IMPLANT BREAST SILICONE/EQ  3716    Bilateral breast implants      Social History     Socioeconomic History   ??? Marital status: MARRIED     Spouse name: Not on file   ??? Number of children: Not on file   ??? Years of education: Not on file   ??? Highest education level: Not on file   Social Needs   ??? Financial resource strain: Not on file   ??? Food insecurity - worry: Not on file   ??? Food insecurity - inability: Not on file   ??? Transportation needs - medical: Not on file   ??? Transportation needs - non-medical: Not on file   Occupational History   ??? Not on file   Tobacco Use   ??? Smoking status: Never Smoker   ??? Smokeless tobacco: Never Used   Substance and Sexual Activity   ??? Alcohol use: No     Comment: Pt denies   ??? Drug use: No   ??? Sexual activity: Yes     Partners: Male   Other Topics Concern   ??? Not on file   Social History Narrative   ??? Not on file     Family History   Problem Relation Age of Onset   ??? Heart Disease Mother    ??? Heart Disease Father    ??? Cancer Father         thyroid cancer   ??? Arthritis-rheumatoid Sister    ??? No Known Problems Son    ??? No Known Problems Daughter    ??? No Known Problems Daughter    ??? No Known Problems Daughter    ??? Anesth Problems  Neg Hx           Objective:   ROS:  Per HPI or PMH o/w neg    No Known Allergies  Meds:  Outpatient Medications Prior to Visit    Medication Sig Dispense Refill   ??? buPROPion XL (WELLBUTRIN XL) 150 mg tablet TK 1 T PO  D.  0   ??? rOPINIRole (REQUIP) 1 mg tablet TAKE 4 TABLETS BY MOUTH 1 TO 3 HOURS BEFORE BEDTIME EVERYDAY 120 Tab 5   ??? diazePAM (VALIUM) 5 mg tablet Take 10 mg by mouth two (2) times a day. 5 mg every morning and 10 mg QHS  5   ??? gabapentin enacarbil (HORIZANT) 600 mg TbER Take 1 Tab by mouth every evening. 30 Tab 11   ??? ergocalciferol (ERGOCALCIFEROL) 50,000 unit capsule Take 1 Cap by mouth every seven (7) days. 12 Cap 1   ??? escitalopram oxalate (LEXAPRO) 20 mg tablet Take 1 Tab by mouth daily. 30 Tab 5   ??? omeprazole (PRILOSEC) 20 mg capsule Take 20 mg by mouth nightly.     ??? pravastatin (PRAVACHOL) 40 mg tablet TAKE 1 TABLET BY MOUTH EVERY NIGHT 30 Tab 3   ??? oxyCODONE-acetaminophen (PERCOCET) 5-325 mg per tablet Take 1 Tab by mouth every eight (8) hours as needed for Pain. Max Daily Amount: 3 Tabs. Indications: Pain 10 Tab 0   ??? pravastatin (PRAVACHOL) 40 mg tablet 1 po every day Please make a med check appointment in the next month. Thank you. 30 Tab 0   ??? ferrous sulfate 325 mg (65 mg iron) tablet Take 1 Tab by mouth daily (with breakfast). 10 Tab 0   ??? aspirin 81 mg chewable tablet Take 1 Tab by mouth daily. 30 Tab 0     No facility-administered medications prior to visit.        Imaging:  MRI Results (most recent):  Results from Hospital Encounter encounter on 02/17/17   MRI BRAIN W WO CONT    Narrative EXAM:  MRI BRAIN W WO CONT    INDICATION:    Pt with MRI findings concerning for MS, but no clinical history  to suggest MS, now with new bilateral rotary nystagmus    COMPARISON:  MRI brain 03/27/2016.    CONTRAST: 15 ml Dotarem.    TECHNIQUE:    Multiplanar multisequence acquisition without and with contrast of the brain.    FINDINGS:  Generalized parenchymal volume loss with commensurate dilation of the sulci and  ventricular system. Unchanged periventricular and deep white matter T2/FLAIR   hyperintensities, in a pattern most consistent with chronic microvascular  ischemic disease. No involvement of the callosal septal interface. Small chronic  infarct in the right cerebellum. There is no acute infarct, hemorrhage,  extra-axial fluid collection, or mass effect. There is no cerebellar tonsillar  herniation. Expected arterial flow-voids are present. No evidence of abnormal  enhancement.    The paranasal sinuses, mastoid air cells, and middle ears are clear. The orbital  contents are within normal limits. No significant osseous or scalp lesions are  identified.      Impression IMPRESSION:   1. No evidence of acute intracranial abnormality.  2. Unchanged generalized parenchymal volume loss and chronic microvascular  ischemic disease. No evidence of demyelinating disease as clinically queried.      CT Results (most recent):  Results from [place] encounter on 04/10/17   CT HEAD WO CONT    Narrative EXAM:  CT HEAD WO CONT  INDICATION:   headache/dizzy    COMPARISON: CT head 03/26/2016.Marland Kitchen    TECHNIQUE: Unenhanced CT of the head was performed using 5 mm images. Brain and  bone windows were generated.  CT dose reduction was achieved through use of a  standardized protocol tailored for this examination and automatic exposure  control for dose modulation.     FINDINGS:  Residual intravenous contrast material noted, which limits evaluation for acute  hemorrhage. The ventricles are normal in size and position. Scattered  periventricular and deep white matter hypodensities, consistent with moderate  chronic microangiopathic ischemic changes. Basilar cisterns are patent. No  midline shift. There is no evidence of acute infarct, hemorrhage, or extraaxial  fluid collection.    The paranasal sinuses, mastoid air cells, and middle ears are clear. The orbital  contents are within normal limits.  There are no significant osseous or  extracranial soft tissue lesions.      Impression IMPRESSION:   1. No evidence of acute intracranial abnormality.  2. Unchanged moderate chronic microvascular ischemic disease.        Reviewed records in [facility] and media tab today  Lab Review   Results for orders placed or performed during the hospital encounter of 04/10/17   INFLUENZA A & B AG (RAPID TEST)   Result Value Ref Range    Influenza A Antigen NEGATIVE  NEG      Influenza B Antigen NEGATIVE  NEG     TROPONIN I   Result Value Ref Range    Troponin-I, Qt. <0.05 <0.05 ng/mL   CK W/ REFLX CKMB   Result Value Ref Range    CK 135 26 - 829 U/L   METABOLIC PANEL, COMPREHENSIVE   Result Value Ref Range    Sodium 138 136 - 145 mmol/L    Potassium 4.3 3.5 - 5.1 mmol/L    Chloride 102 97 - 108 mmol/L    CO2 28 21 - 32 mmol/L    Anion gap 8 5 - 15 mmol/L    Glucose 103 (H) 65 - 100 mg/dL    BUN 15 6 - 20 MG/DL    Creatinine 0.73 0.55 - 1.02 MG/DL    BUN/Creatinine ratio 21 (H) 12 - 20      GFR est AA >60 >60 ml/min/1.71m    GFR est non-AA >60 >60 ml/min/1.745m   Calcium 8.6 8.5 - 10.1 MG/DL    Bilirubin, total 0.1 (L) 0.2 - 1.0 MG/DL    ALT (SGPT) 18 12 - 78 U/L    AST (SGOT) 19 15 - 37 U/L    Alk. phosphatase 188 (H) 45 - 117 U/L    Protein, total 7.3 6.4 - 8.2 g/dL    Albumin 3.7 3.5 - 5.0 g/dL    Globulin 3.6 2.0 - 4.0 g/dL    A-G Ratio 1.0 (L) 1.1 - 2.2     CBC W/O DIFF   Result Value Ref Range    WBC 5.7 3.6 - 11.0 K/uL    RBC 3.99 3.80 - 5.20 M/uL    HGB 9.2 (L) 11.5 - 16.0 g/dL    HCT 30.2 (L) 35.0 - 47.0 %    MCV 75.7 (L) 80.0 - 99.0 FL    MCH 23.1 (L) 26.0 - 34.0 PG    MCHC 30.5 30.0 - 36.5 g/dL    RDW 15.3 (H) 11.5 - 14.5 %    PLATELET 237 150 - 400 K/uL    MPV 8.6 (L) 8.9 - 12.9 FL  NRBC 0.0 0 PER 100 WBC    ABSOLUTE NRBC 0.00 0.00 - 0.01 K/uL   D DIMER   Result Value Ref Range    D-dimer 2.57 (H) 0.00 - 0.65 mg/L FEU   URINALYSIS W/ REFLEX CULTURE   Result Value Ref Range    Color YELLOW/STRAW      Appearance CLEAR CLEAR      Specific gravity >1.030 (H) 1.003 - 1.030    pH (UA) 7.0 5.0 - 8.0       Protein NEGATIVE  NEG mg/dL    Glucose NEGATIVE  NEG mg/dL    Ketone NEGATIVE  NEG mg/dL    Bilirubin NEGATIVE  NEG      Blood NEGATIVE  NEG      Urobilinogen 0.2 0.2 - 1.0 EU/dL    Nitrites NEGATIVE  NEG      Leukocyte Esterase NEGATIVE  NEG      UA:UC IF INDICATED CULTURE NOT INDICATED BY UA RESULT CNI      WBC 0-4 0 - 4 /hpf    RBC 0-5 0 - 5 /hpf    Epithelial cells FEW FEW /lpf    Bacteria NEGATIVE  NEG /hpf    Hyaline cast 0-2 0 - 5 /lpf   CBC WITH AUTOMATED DIFF   Result Value Ref Range    WBC 5.4 3.6 - 11.0 K/uL    RBC 3.59 (L) 3.80 - 5.20 M/uL    HGB 8.3 (L) 11.5 - 16.0 g/dL    HCT 27.6 (L) 35.0 - 47.0 %    MCV 76.9 (L) 80.0 - 99.0 FL    MCH 23.1 (L) 26.0 - 34.0 PG    MCHC 30.1 30.0 - 36.5 g/dL    RDW 15.4 (H) 11.5 - 14.5 %    PLATELET 222 150 - 400 K/uL    MPV 8.6 (L) 8.9 - 12.9 FL    NRBC 0.0 0 PER 100 WBC    ABSOLUTE NRBC 0.00 0.00 - 0.01 K/uL    NEUTROPHILS 66 32 - 75 %    LYMPHOCYTES 24 12 - 49 %    MONOCYTES 7 5 - 13 %    EOSINOPHILS 2 0 - 7 %    BASOPHILS 1 0 - 1 %    IMMATURE GRANULOCYTES 1 (H) 0.0 - 0.5 %    ABS. NEUTROPHILS 3.6 1.8 - 8.0 K/UL    ABS. LYMPHOCYTES 1.3 0.8 - 3.5 K/UL    ABS. MONOCYTES 0.4 0.0 - 1.0 K/UL    ABS. EOSINOPHILS 0.1 0.0 - 0.4 K/UL    ABS. BASOPHILS 0.0 0.0 - 0.1 K/UL    ABS. IMM. GRANS. 0.0 0.00 - 0.04 K/UL    DF AUTOMATED     MAGNESIUM   Result Value Ref Range    Magnesium 2.1 1.6 - 2.4 mg/dL   METABOLIC PANEL, COMPREHENSIVE   Result Value Ref Range    Sodium 140 136 - 145 mmol/L    Potassium 3.2 (L) 3.5 - 5.1 mmol/L    Chloride 105 97 - 108 mmol/L    CO2 23 21 - 32 mmol/L    Anion gap 12 5 - 15 mmol/L    Glucose 126 (H) 65 - 100 mg/dL    BUN 12 6 - 20 MG/DL    Creatinine 0.80 0.55 - 1.02 MG/DL    BUN/Creatinine ratio 15 12 - 20      GFR est AA >60 >60 ml/min/1.39m    GFR est non-AA >60 >60 ml/min/1.784m  Calcium 8.4 (L) 8.5 - 10.1 MG/DL    Bilirubin, total 0.2 0.2 - 1.0 MG/DL    ALT (SGPT) 15 12 - 78 U/L    AST (SGOT) 13 (L) 15 - 37 U/L     Alk. phosphatase 159 (H) 45 - 117 U/L    Protein, total 6.8 6.4 - 8.2 g/dL    Albumin 3.2 (L) 3.5 - 5.0 g/dL    Globulin 3.6 2.0 - 4.0 g/dL    A-G Ratio 0.9 (L) 1.1 - 2.2     HEMOGLOBIN A1C WITH EAG   Result Value Ref Range    Hemoglobin A1c 5.6 4.2 - 6.3 %    Est. average glucose 114 mg/dL   ANA QL, W/REFLEX CASCADE   Result Value Ref Range    ANA Direct NEGATIVE  NEGATIVE      See below Comment     ANTI-NEUTROPHIL CYTOPLASMIC AB   Result Value Ref Range    Cytoplasmic (C-ANCA) Ab <1:20 Neg:<1:20 titer    Perinuclear (P-ANCA) <1:20 Neg:<1:20 titer    Atypical pANCA <1:20 Neg:<1:20 titer   TSH 3RD GENERATION   Result Value Ref Range    TSH 5.06 (H) 0.36 - 3.74 uIU/mL   SAMPLES BEING HELD   Result Value Ref Range    SAMPLES BEING HELD 1 PST     COMMENT        Add-on orders for these samples will be processed based on acceptable specimen integrity and analyte stability, which may vary by analyte.   EKG, 12 LEAD, INITIAL   Result Value Ref Range    Ventricular Rate 58 BPM    Atrial Rate 58 BPM    P-R Interval 124 ms    QRS Duration 78 ms    Q-T Interval 464 ms    QTC Calculation (Bezet) 455 ms    Calculated P Axis 1 degrees    Calculated R Axis 73 degrees    Calculated T Axis 70 degrees    Diagnosis       Sinus bradycardia  When compared with ECG of 28-Mar-2017 10:25,  Questionable change in QRS axis  Nonspecific T wave abnormality no longer evident in Inferior leads  Confirmed by Dennie Bible [id number]) on 04/11/2017 12:34:57 PM          Exam:  Visit Vitals  BP 108/80   Pulse 80   Resp 16   Ht '5\' 3"'  (1.6 m)   Wt 84.1 kg (185 lb 6.4 oz)   SpO2 96%   BMI 32.84 kg/m??     General:  Alert, cooperative, no distress.    Head:  Normocephalic, without obvious abnormality, atraumatic.   Respiratory:  Heart:   Non labored breathing  Regular rate and rhythm, no murmurs       Extremities: Warm, no cyanosis or edema.   Pulses: 2+ radial pulses.       Neurologic:  MS: Alert and oriented x 4, speech intact. Language - inact.  Attention and fund of knowledge appropriate.  Recent and remote memory intact.  Cranial Nerves:  II: visual fields Full to confrontation   II: pupils PERRL   II: optic disc    III,VII: ptosis Mild right ptosis - stable   III,IV,VI: extraocular muscles  EOMI, bilateral end gaze rotary nystagmus, no diplopia   V: facial light touch sensation     VII: facial muscle function   symmetric   VIII: hearing intact   IX: soft palate elevation  Palate elevates sym  XI: trapezius strength     XI: sternocleidomastoid strength    XII: tongue  Midline     Motor: normal bulk and tone, no tremor              Strength: 5/5 throughout, no PD    Coordination: FTN and RAM intact, and HTS intact  Gait: normal gait, able to tandem walk  Reflexes: 2+ sym       Assessment/Plan   Pt is a 61 y.o. right handed female with h/o radiographic changes c/w MS, but no clinical symptoms, MRI brain 02/17/17 is stable. Has RLS, with an ill-defined irritating sensation in her legs with urge to move her legs beginning around 6PM and keeping her awake and inhibiting her activities, though question if there may be some component of psych issues contributing to her need to walk. Now giving a very tangential, but detailed history, with various complaints including cognitive concerns. On exam she is tearful, has no SOB with continuous talking, bilateral rotary nystagmus worse at end gaze to right, stable right ptosis, o/w unremarkable. Continue Horizant 661m qPM and Requip 440mqhs for RLS, pt is reminded to not let her Rx lapse. Low suspicion for dementia at this time, suspect some of these issues are personality related and mood related. Recommend Neuropsych testing to tease out dementia vs pseudodementia due to underlying mood disorder/personality d/o, and stress. Pt is encouraged to follow through with sleep eval, discussed how OSA could impact sleep, mood, and be contributing to wt gain.  F/u in  clinic in 4 months, instructed to call in the interim if needed.     ICD-10-CM ICD-9-CM    1. RLS (restless legs syndrome) G25.81 333.94    2. Abnormal brain scan R94.02 794.09 REFERRAL TO NEUROPSYCHOLOGY   3. Rotary nystagmus H55.09 379.56    4. Memory loss R41.3 780.93 REFERRAL TO NEUROPSYCHOLOGY       Signed:  MaChauncey ReadingMD  05/23/2017

## 2017-05-24 NOTE — Progress Notes (Signed)
Faxed referral and notes to Dr. Apolonio Schneidersgrady.

## 2017-06-10 NOTE — Telephone Encounter (Signed)
Per Dr. Virl Sonansom's last note, patient was to see Sleep Medicine and Neuropsych. She hasn't scheduled with either. I advised her to follow through with these appointments and notify us after these are done. Patient agrees to this plan.

## 2017-06-10 NOTE — Telephone Encounter (Signed)
Pt calling needing to speak to the nurse again. Has another question to ask. Please call back

## 2017-06-10 NOTE — Telephone Encounter (Signed)
-----   Message from Elease EtienneKaren I Harris sent at 06/10/2017 10:29 AM EST -----  Regarding: Dr Ransom/telephone  Pt (p) [phone], pt said she has something like restless leg syndrome during the day, feels like she can not keep her leg still , this week and last week starting  around 1:00pm, making driving difficult  she tries to walk a lot to try to walk it off so she can try to sleep at night, she calling to let Dr Alison Murrayansom know the medication is not working anymore

## 2017-06-10 NOTE — Telephone Encounter (Signed)
Dr. Alison Murrayansom, patient reports that her RLS is worsening, Formerly she was "having to start walking" at [facility]. Now she feels the need to start walking at 1PM. "I am starting to lose my mind". Patient has been taking Requip 1 mg, 4 tabs as ordered. I encouraged her to pursue Sleep Medicine and Neuropsych, per your last note.   Dr. Alison Murrayansom, please advise. (patient is aware that you are out of the office)

## 2017-06-13 MED ORDER — GABAPENTIN ENACARBIL ER 600 MG TABLET,EXTENDED RELEASE
600 mg | ORAL_TABLET | Freq: Two times a day (BID) | ORAL | 11 refills | Status: DC
Start: 2017-06-13 — End: 2018-03-20

## 2017-06-13 NOTE — Telephone Encounter (Signed)
Joe - Please call pt:  Increase Horizant to 600mg  bid and continue Requip 4mg  three hours before bedtime.  I may need to refer her to a specialist for her refractory RLS if this does not help.

## 2017-06-14 NOTE — Telephone Encounter (Signed)
I gave patient these new med orders. She expressed understanding and agrees to this plan.

## 2017-06-18 ENCOUNTER — Emergency Department: Admit: 2017-06-19 | Payer: BLUE CROSS/BLUE SHIELD | Primary: Internal Medicine

## 2017-06-18 DIAGNOSIS — S2232XA Fracture of one rib, left side, initial encounter for closed fracture: Secondary | ICD-10-CM

## 2017-06-18 NOTE — ED Notes (Signed)
Pt ambulatory with steady gait upon exit of ED. A&O x4. VSS.

## 2017-06-18 NOTE — ED Triage Notes (Addendum)
Pt states she hurt her lt ribs a wk ago and [facility] and [facility]ray and was told it was sprained and was called today and told it was broken and was told to come to ED if pain worse. Pt also states she has a heaviness in her chest and is going down left arm.

## 2017-06-18 NOTE — ED Provider Notes (Signed)
EMERGENCY DEPARTMENT HISTORY AND PHYSICAL EXAM      Date: 06/18/2017  Patient Name: Lindsay [name]    History of Presenting Illness     Chief Complaint   Patient presents with   ??? Rib Pain   ??? Arm Pain     History Provided By: Patient    HPI: Lindsay [name], 61 y.o. female with PMHx significant for DVT and PE, presents herself to the ED with cc of progressively worsening left rib pain since Saturday (06/11/17). Pt reports associated chills since onset of pain. She notes that she initially injured herself while playing with a dog on the floor. Pt was seen at [facility] yesterday (06/17/17) where she had imaging done. Pt was informed that she had a fractured 7th rib on the left and was told to go to the ER should pain worsen. Pt notes exacerbation of pain w/ taking deep breaths. She notes no improvement of pain with taking the prescribed Tylenol w/ codeine. She specifically denies fever.     There are no other complaints, changes, or physical findings at this time.    PCP: Lindsay Lipps, MD    Current Facility-Administered Medications   Medication Dose Route Frequency Provider Last Rate Last Dose   ??? ketorolac (TORADOL) injection 30 mg  30 mg IntraMUSCular ONCE Vonita Calloway, Stephani Police., MD         Current Outpatient Medications   Medication Sig Dispense Refill   ??? ibuprofen (MOTRIN) 600 mg tablet Take 1 Tab by mouth every six (6) hours as needed for Pain. 20 Tab 0   ??? oxyCODONE-acetaminophen (PERCOCET) 5-325 mg per tablet Take 1 Tab by mouth every eight (8) hours as needed for Pain. Max Daily Amount: 3 Tabs. 10 Tab 0   ??? ondansetron (ZOFRAN ODT) 4 mg disintegrating tablet Take 1 Tab by mouth every eight (8) hours as needed for Nausea. 10 Tab 0   ??? gabapentin enacarbil (HORIZANT) 600 mg TbER Take 1 Tab by mouth two (2) times a day. 60 Tab 11   ??? buPROPion XL (WELLBUTRIN XL) 150 mg tablet TK 1 T PO  D.  0   ??? rOPINIRole (REQUIP) 1 mg tablet TAKE 4 TABLETS BY MOUTH 1 TO 3 HOURS BEFORE BEDTIME EVERYDAY 120 Tab 5    ??? pravastatin (PRAVACHOL) 40 mg tablet 1 po every day Please make a med check appointment in the next month. Thank you. 30 Tab 0   ??? diazePAM (VALIUM) 5 mg tablet Take 10 mg by mouth two (2) times a day. 5 mg every morning and 10 mg QHS  5   ??? aspirin 81 mg chewable tablet Take 1 Tab by mouth daily. 30 Tab 0   ??? ergocalciferol (ERGOCALCIFEROL) 50,000 unit capsule Take 1 Cap by mouth every seven (7) days. 12 Cap 1   ??? escitalopram oxalate (LEXAPRO) 20 mg tablet Take 1 Tab by mouth daily. 30 Tab 5   ??? omeprazole (PRILOSEC) 20 mg capsule Take 20 mg by mouth nightly.         Past History     Past Medical History:  Past Medical History:   Diagnosis Date   ??? Abnormal brain scan    ??? Arthritis    ??? Chronic pain    ??? Depression    ??? DVT (deep venous thrombosis) ([place])    ??? Falls frequently    ??? Fracture of left ankle    ??? Headache(784.0) 2011   ??? Ill-defined condition 09/15/2015  FX R HAND   ??? Other and unspecified hyperlipidemia 12/27/2012   ??? PE (pulmonary embolism) 12/2012   ??? RLS (restless legs syndrome) 03/16/2016    Dr Roseanne Reno   ??? TIA (transient ischemic attack) 03/2016    Right facial droop, numbness in lip   ??? Torn rotator cuff     right       Past Surgical History:  Past Surgical History:   Procedure Laterality Date   ??? BREAST SURGERY PROCEDURE UNLISTED  2002    AUGMENTATION   ??? HX APPENDECTOMY     ??? HX CESAREAN SECTION      x4   ??? HX GI      COLONOSCOPY   ??? 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 HX ORTHOPAEDIC  08/2004    rotater cuff r. shoulder   ??? HX ORTHOPAEDIC      left ankle fracture treated with air cast   ??? HX ORTHOPAEDIC  6/13    left shoulder fxt and dislocation   ??? HX OTHER SURGICAL  2010    left foot torn tendon   ??? HX TUBAL LIGATION     ??? IMPLANT BREAST SILICONE/EQ  6333    Bilateral breast implants       Family History:  Family History   Problem Relation Age of Onset   ??? Heart Disease Mother    ??? Heart Disease Father    ??? Cancer Father         thyroid cancer   ??? Arthritis-rheumatoid Sister     ??? No Known Problems Son    ??? No Known Problems Daughter    ??? No Known Problems Daughter    ??? No Known Problems Daughter    ??? Anesth Problems Neg Hx        Social History:  Social History     Tobacco Use   ??? Smoking status: Never Smoker   ??? Smokeless tobacco: Never Used   Substance Use Topics   ??? Alcohol use: No     Comment: Pt denies   ??? Drug use: No       Allergies:  No Known Allergies      Review of Systems   Review of Systems   Constitutional: Positive for chills. Negative for activity change, appetite change, fever and unexpected weight change.   HENT: Negative for congestion.    Eyes: Negative for pain and visual disturbance.   Respiratory: Negative for cough and shortness of breath.    Cardiovascular: Negative for chest pain.   Gastrointestinal: Negative for abdominal pain, diarrhea, nausea and vomiting.   Genitourinary: Negative for dysuria.   Musculoskeletal: Positive for arthralgias (left rib pain). Negative for back pain.   Skin: Negative for rash.   Neurological: Negative for headaches.       Physical Exam   Physical Exam   Constitutional: She is oriented to person, place, and time. She appears well-developed and well-nourished. She appears distressed (mild due to pain).   Elderly female   HENT:   Head: Normocephalic and atraumatic.   Mouth/Throat: Oropharynx is clear and moist.   Eyes: Conjunctivae and EOM are normal. Pupils are equal, round, and reactive to light. Right eye exhibits no discharge. Left eye exhibits no discharge.   Neck: Normal range of motion. Neck supple.   Cardiovascular: Normal rate, regular rhythm and normal heart sounds.   No murmur heard.  Pulmonary/Chest: Effort normal and breath sounds normal. No tachypnea. No respiratory distress. She has  no wheezes. She has no rhonchi. She has no rales. She exhibits tenderness (left lower rib).   Abdominal: Soft. Bowel sounds are normal. She exhibits no distension. There is no tenderness. There is no rebound and no guarding.    No spleenic tenderness   Neurological: She is alert and oriented to person, place, and time. No cranial nerve deficit. She exhibits normal muscle tone.   Skin: Skin is warm and dry. No rash noted. She is not diaphoretic.   Nursing note and vitals reviewed.    Diagnostic Study Results     Labs -  Recent Results (from the past 12 hour(s))   EKG, 12 LEAD, INITIAL    Collection Time: 06/18/17  7:26 PM   Result Value Ref Range    Ventricular Rate 58 BPM    Atrial Rate 58 BPM    P-R Interval 130 ms    QRS Duration 80 ms    Q-T Interval 452 ms    QTC Calculation (Bezet) 443 ms    Calculated P Axis 51 degrees    Calculated R Axis 23 degrees    Calculated T Axis 36 degrees    Diagnosis       Sinus bradycardia  Low voltage QRS  When compared with ECG of 10-Apr-2017 11:22,  Nonspecific T wave abnormality now evident in Inferior leads     TROPONIN I    Collection Time: 06/18/17  7:53 PM   Result Value Ref Range    Troponin-I, Qt. <0.05 <0.05 ng/mL   CBC W/O DIFF    Collection Time: 06/18/17  7:53 PM   Result Value Ref Range    WBC 5.8 3.6 - 11.0 K/uL    RBC 3.86 3.80 - 5.20 M/uL    HGB 9.0 (L) 11.5 - 16.0 g/dL    HCT 30.0 (L) 35.0 - 47.0 %    MCV 77.7 (L) 80.0 - 99.0 FL    MCH 23.3 (L) 26.0 - 34.0 PG    MCHC 30.0 30.0 - 36.5 g/dL    RDW 16.2 (H) 11.5 - 14.5 %    PLATELET 241 150 - 400 K/uL    MPV 9.8 8.9 - 12.9 FL    NRBC 0.0 0 PER 100 WBC    ABSOLUTE NRBC 0.00 0.00 - 5.03 K/uL   METABOLIC PANEL, COMPREHENSIVE    Collection Time: 06/18/17  7:53 PM   Result Value Ref Range    Sodium 135 (L) 136 - 145 mmol/L    Potassium 3.9 3.5 - 5.1 mmol/L    Chloride 102 97 - 108 mmol/L    CO2 27 21 - 32 mmol/L    Anion gap 6 5 - 15 mmol/L    Glucose 82 65 - 100 mg/dL    BUN 16 6 - 20 MG/DL    Creatinine 0.69 0.55 - 1.02 MG/DL    BUN/Creatinine ratio 23 (H) 12 - 20      GFR est AA >60 >60 ml/min/1.86m    GFR est non-AA >60 >60 ml/min/1.748m   Calcium 8.3 (L) 8.5 - 10.1 MG/DL    Bilirubin, total 0.2 0.2 - 1.0 MG/DL     ALT (SGPT) 47 12 - 78 U/L    AST (SGOT) 39 (H) 15 - 37 U/L    Alk. phosphatase 202 (H) 45 - 117 U/L    Protein, total 6.8 6.4 - 8.2 g/dL    Albumin 3.3 (L) 3.5 - 5.0 g/dL    Globulin 3.5 2.0 - 4.0 g/dL  A-G Ratio 0.9 (L) 1.1 - 2.2     D DIMER    Collection Time: 06/18/17  7:53 PM   Result Value Ref Range    D-dimer 2.71 (H) 0.00 - 0.65 mg/L FEU     Radiologic Studies -   CXR Results  (Last 48 hours)               06/18/17 2124  XR CHEST PA LAT Final result    Impression:  IMPRESSION:       No displaced rib fracture or pneumothorax. No change given difference in   technique.           Narrative:  EXAM: XR CHEST PA LAT       INDICATION: Left chest wall pain since fall one week ago.       COMPARISON: CT chest on 04/10/2017.       TECHNIQUE: PA and lateral chest views       FINDINGS: The cardiomediastinal and hilar contours are within normal limits. The   pulmonary vasculature is within normal limits.        The lungs and pleural spaces are clear. No displaced rib fracture.               Medical Decision Making   I am the first provider for this patient.    I reviewed the vital signs, available nursing notes, past medical history, past surgical history, family history and social history.    Vital Signs-Reviewed the patient's vital signs.  Patient Vitals for the past 12 hrs:   Temp Pulse Resp BP SpO2   06/18/17 1917 99.2 ??F (37.3 ??C) 66 18 121/76 94 %     EKG interpretation: (Preliminary) 19:26  Rhythm: sinus bradycardia; and regular . Rate (approx.): 58; Axis: normal; PR interval: normal; QRS interval: low voltage; ST/T wave: normal; Other findings: abnormal ekg.  Written by Juline Patch, ED Scribe, as dictated by Vivianne Master, MD.    Records Reviewed: Nursing Notes, Old Medical Records, Previous Radiology Studies and Previous Laboratory Studies    Provider Notes (Medical Decision Making):   This is a female presenting with MSK pain due to injury while playing with  her dog. Pain increased yesterday after manipulation by doc in urgent care. Do not feel this pain is related to cardiac or vascular concerns. Do not agree with ddimer sent from triage as pt has a history of ddimer >2 at baseline. Despite today's results (given prior levels) I do not fel it is appropriate to send her to CT scan.     ED Course:   Initial assessment performed. The patients presenting problems have been discussed, and they are in agreement with the care plan formulated and outlined with them.  I have encouraged them   to ask questions as they arise throughout their visit.    MEDICATIONS GIVEN:  Medications   ketorolac (TORADOL) injection 30 mg (not administered)   HYDROcodone-acetaminophen (NORCO) 5-325 mg per tablet 1 Tab (1 Tab Oral Given 06/18/17 1939)     21:30 Patient re-evaluated and appears comfortable after norco. Able to move and breath easily. Discussed results and home care as well as return precautions.     Critical Care Time: 0 min    Discharge Note:  9:40 PM  The pt is ready for discharge. The pt's signs, symptoms, diagnosis, and discharge instructions have been discussed and pt has conveyed their understanding. The pt is to follow up as recommended or return to ER  should their symptoms worsen. Plan has been discussed and pt is in agreement.    PLAN:  1.   Current Discharge Medication List      START taking these medications    Details   ibuprofen (MOTRIN) 600 mg tablet Take 1 Tab by mouth every six (6) hours as needed for Pain.  Qty: 20 Tab, Refills: 0    Associated Diagnoses: Closed fracture of one rib of left side, initial encounter      oxyCODONE-acetaminophen (PERCOCET) 5-325 mg per tablet Take 1 Tab by mouth every eight (8) hours as needed for Pain. Max Daily Amount: 3 Tabs.  Qty: 10 Tab, Refills: 0    Associated Diagnoses: Closed fracture of one rib of left side, initial encounter      ondansetron (ZOFRAN ODT) 4 mg disintegrating tablet Take 1 Tab by mouth  every eight (8) hours as needed for Nausea.  Qty: 10 Tab, Refills: 0    Associated Diagnoses: Closed fracture of one rib of left side, initial encounter           2.   Follow-up Information     Follow up With Specialties Details Why Contact Info    Lindsay Lipps, MD Internal Medicine Schedule an appointment as soon as possible for a visit for recheck Monday [address] [facility] [place]  [place] [address]  [phone]      [facility] EMERGENCY DEPT Emergency Medicine  If symptoms worsen [address]  [place]  [phone]        Return to ED if worse     Diagnosis     Clinical Impression:   1. Closed fracture of one rib of left side, initial encounter        Attestations:  This note is prepared by [employer], acting as Education administrator for Vivianne Master, MD    Vivianne Master, MD: The scribe's documentation has been prepared under my direction and personally reviewed by me in its entirety. I confirm that the note above accurately reflects all work, treatment, procedures, and medical decision making performed by me.

## 2017-06-19 ENCOUNTER — Inpatient Hospital Stay
Admit: 2017-06-19 | Discharge: 2017-06-19 | Disposition: A | Discharge status: Home / Self Care | Payer: BLUE CROSS/BLUE SHIELD | Attending: Emergency Medicine

## 2017-06-19 LAB — EKG 12-LEAD
Atrial Rate: 58 {beats}/min
P Axis: 51 degrees
P-R Interval: 130 ms
Q-T Interval: 452 ms
QRS Duration: 80 ms
QTc Calculation (Bazett): 443 ms
R Axis: 23 degrees
T Axis: 36 degrees
Ventricular Rate: 58 {beats}/min

## 2017-06-19 LAB — D-DIMER, QUANTITATIVE: D-Dimer, Quant: 2.71 mg/L FEU — ABNORMAL HIGH (ref 0.00–0.65)

## 2017-06-19 LAB — EKG, 12 LEAD, INITIAL
Atrial Rate: 58 {beats}/min
Calculated P Axis: 51 degrees
Calculated R Axis: 23 degrees
Calculated T Axis: 36 degrees
P-R Interval: 130 ms
Q-T Interval: 452 ms
QRS Duration: 80 ms
QTC Calculation (Bezet): 443 ms
Ventricular Rate: 58 {beats}/min

## 2017-06-19 LAB — METABOLIC PANEL, COMPREHENSIVE
A-G Ratio: 0.9 — ABNORMAL LOW (ref 1.1–2.2)
ALT (SGPT): 47 U/L (ref 12–78)
AST (SGOT): 39 U/L — ABNORMAL HIGH (ref 15–37)
Albumin: 3.3 g/dL — ABNORMAL LOW (ref 3.5–5.0)
Alk. phosphatase: 202 U/L — ABNORMAL HIGH (ref 45–117)
Anion gap: 6 mmol/L (ref 5–15)
BUN/Creatinine ratio: 23 — ABNORMAL HIGH (ref 12–20)
BUN: 16 MG/DL (ref 6–20)
Bilirubin, total: 0.2 MG/DL (ref 0.2–1.0)
CO2: 27 mmol/L (ref 21–32)
Calcium: 8.3 MG/DL — ABNORMAL LOW (ref 8.5–10.1)
Chloride: 102 mmol/L (ref 97–108)
Creatinine: 0.69 MG/DL (ref 0.55–1.02)
GFR est AA: >60 mL/min/{1.73_m2} (ref >60)
GFR est non-AA: >60 mL/min/{1.73_m2} (ref >60)
Globulin: 3.5 g/dL (ref 2.0–4.0)
Glucose: 82 mg/dL (ref 65–100)
Potassium: 3.9 mmol/L (ref 3.5–5.1)
Protein, total: 6.8 g/dL (ref 6.4–8.2)
Sodium: 135 mmol/L — ABNORMAL LOW (ref 136–145)

## 2017-06-19 LAB — CBC W/O DIFF
ABSOLUTE NRBC: 0 10*3/uL (ref 0.00–0.01)
HCT: 30 % — ABNORMAL LOW (ref 35.0–47.0)
HGB: 9 g/dL — ABNORMAL LOW (ref 11.5–16.0)
MCH: 23.3 PG — ABNORMAL LOW (ref 26.0–34.0)
MCHC: 30 g/dL (ref 30.0–36.5)
MCV: 77.7 FL — ABNORMAL LOW (ref 80.0–99.0)
MPV: 9.8 FL (ref 8.9–12.9)
NRBC: 0 PER 100 WBC
PLATELET: 241 10*3/uL (ref 150–400)
RBC: 3.86 M/uL (ref 3.80–5.20)
RDW: 16.2 % — ABNORMAL HIGH (ref 11.5–14.5)
WBC: 5.8 10*3/uL (ref 3.6–11.0)

## 2017-06-19 LAB — D DIMER: D-dimer: 2.71 mg/L FEU — ABNORMAL HIGH (ref 0.00–0.65)

## 2017-06-19 LAB — TROPONIN I: Troponin-I, Qt.: <0.05 ng/mL (ref <0.05)

## 2017-06-19 MED ORDER — ONDANSETRON 4 MG TAB, RAPID DISSOLVE
4 mg | ORAL_TABLET | Freq: Three times a day (TID) | ORAL | 0 refills | Status: DC | PRN
Start: 2017-06-19 — End: 2017-06-21

## 2017-06-19 MED ORDER — OXYCODONE-ACETAMINOPHEN 5 MG-325 MG TAB
5-325 mg | ORAL_TABLET | Freq: Three times a day (TID) | ORAL | 0 refills | Status: DC | PRN
Start: 2017-06-19 — End: 2017-06-21

## 2017-06-19 MED ORDER — IBUPROFEN 600 MG TAB
600 mg | ORAL_TABLET | Freq: Four times a day (QID) | ORAL | 0 refills | Status: DC | PRN
Start: 2017-06-19 — End: 2019-12-13

## 2017-06-19 MED ORDER — KETOROLAC TROMETHAMINE 30 MG/ML INJECTION
30 mg/mL (1 mL) | Freq: Once | INTRAMUSCULAR | Status: AC
Start: 2017-06-19 — End: 2017-06-18
  Administered 2017-06-19: 03:00:00 via INTRAMUSCULAR

## 2017-06-19 MED ORDER — HYDROCODONE-ACETAMINOPHEN 5 MG-325 MG TAB
5-325 mg | ORAL | Status: AC
Start: 2017-06-19 — End: 2017-06-18
  Administered 2017-06-19: 01:00:00 via ORAL

## 2017-06-19 MED FILL — HYDROCODONE-ACETAMINOPHEN 5 MG-325 MG TAB: 5-325 mg | ORAL | Qty: 1

## 2017-06-19 MED FILL — KETOROLAC TROMETHAMINE 30 MG/ML INJECTION: 30 mg/mL (1 mL) | INTRAMUSCULAR | Qty: 1

## 2017-06-21 ENCOUNTER — Emergency Department: Payer: BLUE CROSS/BLUE SHIELD | Primary: Internal Medicine

## 2017-06-21 ENCOUNTER — Inpatient Hospital Stay
Admit: 2017-06-21 | Discharge: 2017-06-21 | Disposition: A | Discharge status: Home / Self Care | Payer: BLUE CROSS/BLUE SHIELD | Attending: Emergency Medicine

## 2017-06-21 DIAGNOSIS — R51 Headache: Secondary | ICD-10-CM

## 2017-06-21 LAB — CBC WITH AUTOMATED DIFF
ABS. BASOPHILS: 0 10*3/uL (ref 0.0–0.1)
ABS. EOSINOPHILS: 0.2 10*3/uL (ref 0.0–0.4)
ABS. IMM. GRANS.: 0 10*3/uL (ref 0.00–0.04)
ABS. LYMPHOCYTES: 1.5 10*3/uL (ref 0.8–3.5)
ABS. MONOCYTES: 0.5 10*3/uL (ref 0.0–1.0)
ABS. NEUTROPHILS: 3.5 10*3/uL (ref 1.8–8.0)
ABSOLUTE NRBC: 0 10*3/uL (ref 0.00–0.01)
BASOPHILS: 1 % (ref 0–1)
EOSINOPHILS: 3 % (ref 0–7)
HCT: 30.2 % — ABNORMAL LOW (ref 35.0–47.0)
HGB: 8.8 g/dL — ABNORMAL LOW (ref 11.5–16.0)
IMMATURE GRANULOCYTES: 0 % (ref 0.0–0.5)
LYMPHOCYTES: 26 % (ref 12–49)
MCH: 23.2 PG — ABNORMAL LOW (ref 26.0–34.0)
MCHC: 29.1 g/dL — ABNORMAL LOW (ref 30.0–36.5)
MCV: 79.7 FL — ABNORMAL LOW (ref 80.0–99.0)
MONOCYTES: 9 % (ref 5–13)
MPV: 9.9 FL (ref 8.9–12.9)
NEUTROPHILS: 61 % (ref 32–75)
NRBC: 0 PER 100 WBC
PLATELET: 222 10*3/uL (ref 150–400)
RBC: 3.79 M/uL — ABNORMAL LOW (ref 3.80–5.20)
RDW: 16.1 % — ABNORMAL HIGH (ref 11.5–14.5)
WBC: 5.7 10*3/uL (ref 3.6–11.0)

## 2017-06-21 LAB — METABOLIC PANEL, COMPREHENSIVE
A-G Ratio: 0.9 — ABNORMAL LOW (ref 1.1–2.2)
ALT (SGPT): 47 U/L (ref 12–78)
AST (SGOT): 42 U/L — ABNORMAL HIGH (ref 15–37)
Albumin: 3.2 g/dL — ABNORMAL LOW (ref 3.5–5.0)
Alk. phosphatase: 207 U/L — ABNORMAL HIGH (ref 45–117)
Anion gap: 4 mmol/L — ABNORMAL LOW (ref 5–15)
BUN/Creatinine ratio: 17 (ref 12–20)
BUN: 14 MG/DL (ref 6–20)
Bilirubin, total: 0.1 MG/DL — ABNORMAL LOW (ref 0.2–1.0)
CO2: 31 mmol/L (ref 21–32)
Calcium: 8.7 MG/DL (ref 8.5–10.1)
Chloride: 104 mmol/L (ref 97–108)
Creatinine: 0.81 MG/DL (ref 0.55–1.02)
GFR est AA: >60 mL/min/{1.73_m2} (ref >60)
GFR est non-AA: >60 mL/min/{1.73_m2} (ref >60)
Globulin: 3.7 g/dL (ref 2.0–4.0)
Glucose: 71 mg/dL (ref 65–100)
Potassium: 4.2 mmol/L (ref 3.5–5.1)
Protein, total: 6.9 g/dL (ref 6.4–8.2)
Sodium: 139 mmol/L (ref 136–145)

## 2017-06-21 LAB — URINALYSIS W/ REFLEX CULTURE
Bacteria: NEGATIVE /hpf
Bilirubin: NEGATIVE
Blood: NEGATIVE
Glucose: NEGATIVE mg/dL
Ketone: NEGATIVE mg/dL
Nitrites: NEGATIVE
Protein: NEGATIVE mg/dL
Specific gravity: 1.02 (ref 1.003–1.030)
Urobilinogen: 0.2 EU/dL (ref 0.2–1.0)
pH (UA): 5 (ref 5.0–8.0)

## 2017-06-21 LAB — LIPASE: Lipase: 169 U/L (ref 73–393)

## 2017-06-21 MED ORDER — SODIUM CHLORIDE 0.9% BOLUS IV
0.9 % | Freq: Once | INTRAVENOUS | Status: AC
Start: 2017-06-21 — End: 2017-06-21
  Administered 2017-06-21: 21:00:00 via INTRAVENOUS

## 2017-06-21 MED ORDER — PROCHLORPERAZINE EDISYLATE 5 MG/ML INJECTION
5 mg/mL | INTRAMUSCULAR | Status: AC
Start: 2017-06-21 — End: 2017-06-21
  Administered 2017-06-21: 21:00:00 via INTRAVENOUS

## 2017-06-21 MED ORDER — ROPINIROLE 1 MG TAB
1 mg | Freq: Once | ORAL | Status: DC
Start: 2017-06-21 — End: 2017-06-21

## 2017-06-21 MED ORDER — TRAMADOL 50 MG TAB
50 mg | ORAL_TABLET | Freq: Four times a day (QID) | ORAL | 0 refills | Status: DC | PRN
Start: 2017-06-21 — End: 2017-06-21

## 2017-06-21 MED ORDER — DIAZEPAM 5 MG TAB
5 mg | ORAL | Status: DC
Start: 2017-06-21 — End: 2017-06-21

## 2017-06-21 MED ORDER — TRAMADOL 50 MG TAB
50 mg | ORAL_TABLET | Freq: Four times a day (QID) | ORAL | 0 refills | Status: DC | PRN
Start: 2017-06-21 — End: 2017-10-30

## 2017-06-21 MED ORDER — KETOROLAC TROMETHAMINE 30 MG/ML INJECTION
30 mg/mL (1 mL) | INTRAMUSCULAR | Status: AC
Start: 2017-06-21 — End: 2017-06-21
  Administered 2017-06-21: 21:00:00 via INTRAVENOUS

## 2017-06-21 MED FILL — PROCHLORPERAZINE EDISYLATE 5 MG/ML INJECTION: 5 mg/mL | INTRAMUSCULAR | Qty: 2

## 2017-06-21 MED FILL — ROPINIROLE 1 MG TAB: 1 mg | ORAL | Qty: 1

## 2017-06-21 MED FILL — DIAZEPAM 5 MG TAB: 5 mg | ORAL | Qty: 2

## 2017-06-21 MED FILL — KETOROLAC TROMETHAMINE 30 MG/ML INJECTION: 30 mg/mL (1 mL) | INTRAMUSCULAR | Qty: 1

## 2017-06-21 NOTE — ED Notes (Signed)
Dr. Call has reviewed discharge instructions with the patient. The patient verbalized understanding. Pt. A&Ox4, respirations even and unlabored.  VS stable as noted in flowsheet.  Declined wheelchair assist from department; paperwork in hand.

## 2017-06-21 NOTE — ED Notes (Addendum)
Patient declining medications, reporting, "I'm just going to go home."  Patient has removed her IV.

## 2017-06-21 NOTE — ED Notes (Signed)
Patient very anxious, walking around department, reporting that she cannot sit still, due to RLS.  Patient reports she has not taken her medication and this RN informed her that she would request an order from the physician.  Patient stating "I just need to go home."

## 2017-06-21 NOTE — ED Provider Notes (Signed)
EXPRESS CARE NOTE:  3:43 PM  I have seen and evaluated this patient in the Express Care portion of triage for headache, neck pain, and elevated blood pressure.  Pt was seen recently and diagnosed with rib fx and discharged home with Motrin and "hydrocodone".  Pt states the pain medicine is making her headache worse, her blood pressure go up, and pain in her neck. The patients care will begin now and orders have been placed. This patient will be seen and provided further care in the Emergency Room.  CJFox, PA-C  EMERGENCY DEPARTMENT HISTORY AND PHYSICAL EXAM      Date: 06/21/2017  Patient Name: Lindsay [name]    History of Presenting Illness     Chief Complaint   Patient presents with   ??? Headache     Dx with left rib fracture x3 days ago. She states she was given medication while in the ED and began to have headache, but she ignored it thinking it would go away. States she was placed on multiple medications for rib fracture and thinks headache is from medications.        History Provided By: Patient    HPI: [name], 61 y.o. female with PMHx significant for HA, PE, restless leg syndrome, presents ambulatory to the ED with cc of a ???top of the head,??? 8/10, intermittent headache s/p being placed on Percocet 12/15. Pt reports associated symptoms of blurred vision and a posterior neck pain. She states she was seen here on 12/15 and dx???d with a rib fx and placed on Zofran, Ibuprofen (last use 9 AM), and Percocet. Since the use of the Percocet (experiences a headache 15 minutes after use of medication), the pt has been experiencing a headache since. The pt also believes her high BP could be contributing to her headache as she was noted to be 132/80, which is unusual for her. She denies any exacerbating factors. She further reports a lower abdominal pain, which she reports could be due to ???gas??? and had taken Gas-X without any relief. Pt denies any h/o headaches, nausea, vomiting, tinnitus, weakness on 1 side, fever,  or neck stiffness.    There are no other complaints, changes, or physical findings at this time.    PCP: Flora Lipps, MD    Current Facility-Administered Medications   Medication Dose Route Frequency Provider Last Rate Last Dose   ??? sodium chloride 0.9 % bolus infusion 1,000 mL  1,000 mL IntraVENous ONCE Jakera Beaupre, Irven Coe, MD 1,000 mL/hr at 06/21/17 1624 1,000 mL at 06/21/17 1624   ??? rOPINIRole (REQUIP) tablet 1 mg  1 mg Oral ONCE Montez Cuda, Irven Coe, MD       ??? diazePAM (VALIUM) tablet 10 mg  10 mg Oral NOW Lisandra Mathisen, Irven Coe, MD         Current Outpatient Medications   Medication Sig Dispense Refill   ??? traMADol (ULTRAM) 50 mg tablet Take 1 Tab by mouth every six (6) hours as needed for Pain. Max Daily Amount: 200 mg. 20 Tab 0   ??? ibuprofen (MOTRIN) 600 mg tablet Take 1 Tab by mouth every six (6) hours as needed for Pain. 20 Tab 0   ??? gabapentin enacarbil (HORIZANT) 600 mg TbER Take 1 Tab by mouth two (2) times a day. 60 Tab 11   ??? buPROPion XL (WELLBUTRIN XL) 150 mg tablet TK 1 T PO  D.  0   ??? rOPINIRole (REQUIP) 1 mg tablet TAKE 4 TABLETS BY MOUTH  1 TO 3 HOURS BEFORE BEDTIME EVERYDAY 120 Tab 5   ??? pravastatin (PRAVACHOL) 40 mg tablet 1 po every day Please make a med check appointment in the next month. Thank you. 30 Tab 0   ??? diazePAM (VALIUM) 5 mg tablet Take 10 mg by mouth two (2) times a day. 5 mg every morning and 10 mg QHS  5   ??? aspirin 81 mg chewable tablet Take 1 Tab by mouth daily. 30 Tab 0   ??? ergocalciferol (ERGOCALCIFEROL) 50,000 unit capsule Take 1 Cap by mouth every seven (7) days. 12 Cap 1   ??? escitalopram oxalate (LEXAPRO) 20 mg tablet Take 1 Tab by mouth daily. 30 Tab 5   ??? omeprazole (PRILOSEC) 20 mg capsule Take 20 mg by mouth nightly.         Past History     Past Medical History:  Past Medical History:   Diagnosis Date   ??? Abnormal brain scan    ??? Arthritis    ??? Chronic pain    ??? Depression    ??? DVT (deep venous thrombosis) ([place])    ??? Falls frequently    ??? Fracture of left ankle     ??? Headache(784.0) 2011   ??? Ill-defined condition 09/15/2015    FX R HAND   ??? Other and unspecified hyperlipidemia 12/27/2012   ??? PE (pulmonary embolism) 12/2012   ??? RLS (restless legs syndrome) 03/16/2016    Dr Roseanne Reno   ??? TIA (transient ischemic attack) 03/2016    Right facial droop, numbness in lip   ??? Torn rotator cuff     right       Past Surgical History:  Past Surgical History:   Procedure Laterality Date   ??? BREAST SURGERY PROCEDURE UNLISTED  2002    AUGMENTATION   ??? HX APPENDECTOMY     ??? HX CESAREAN SECTION      x4   ??? HX GI      COLONOSCOPY   ??? 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 HX ORTHOPAEDIC  08/2004    rotater cuff r. shoulder   ??? HX ORTHOPAEDIC      left ankle fracture treated with air cast   ??? HX ORTHOPAEDIC  6/13    left shoulder fxt and dislocation   ??? HX OTHER SURGICAL  2010    left foot torn tendon   ??? HX TUBAL LIGATION     ??? IMPLANT BREAST SILICONE/EQ  1610    Bilateral breast implants       Family History:  Family History   Problem Relation Age of Onset   ??? Heart Disease Mother    ??? Heart Disease Father    ??? Cancer Father         thyroid cancer   ??? Arthritis-rheumatoid Sister    ??? No Known Problems Son    ??? No Known Problems Daughter    ??? No Known Problems Daughter    ??? No Known Problems Daughter    ??? Anesth Problems Neg Hx        Social History:  Social History     Tobacco Use   ??? Smoking status: Never Smoker   ??? Smokeless tobacco: Never Used   Substance Use Topics   ??? Alcohol use: No     Comment: Pt denies   ??? Drug use: No       Allergies:  No Known Allergies      Review of Systems  Review of Systems   Constitutional: Negative for chills and fever.   HENT: Negative for congestion, rhinorrhea, sneezing, sore throat and tinnitus.    Eyes: Positive for visual disturbance (+blurred vision).   Respiratory: Negative for shortness of breath.    Cardiovascular: Negative for chest pain.   Gastrointestinal: Positive for abdominal pain. Negative for nausea and vomiting.    Musculoskeletal: Positive for neck pain. Negative for back pain, myalgias and neck stiffness.   Skin: Negative for rash.   Neurological: Positive for headaches. Negative for dizziness and weakness.   All other systems reviewed and are negative.    Physical Exam   Physical Exam   Constitutional: She is oriented to person, place, and time. She appears well-developed and well-nourished. No distress.   HENT:   Head: Normocephalic.   Mouth/Throat: Oropharynx is clear and moist.   Eyes: EOM are normal.   Neck: Normal range of motion. Neck supple.   Cardiovascular: Normal rate, regular rhythm, normal heart sounds and intact distal pulses.   Pulmonary/Chest: Effort normal and breath sounds normal. No respiratory distress.   Abdominal: Soft. There is tenderness in the right upper quadrant and left lower quadrant. There is no rebound.   No peritonitis, hyperactive bowel sounds   Musculoskeletal: Normal range of motion. She exhibits no edema.   Neurological: She is alert and oriented to person, place, and time.   Skin: Skin is warm and dry.   Psychiatric: She has a normal mood and affect.     Diagnostic Study Results     Labs -  Recent Results (from the past 12 hour(s))   CBC WITH AUTOMATED DIFF    Collection Time: 06/21/17  4:27 PM   Result Value Ref Range    WBC 5.7 3.6 - 11.0 K/uL    RBC 3.79 (L) 3.80 - 5.20 M/uL    HGB 8.8 (L) 11.5 - 16.0 g/dL    HCT 30.2 (L) 35.0 - 47.0 %    MCV 79.7 (L) 80.0 - 99.0 FL    MCH 23.2 (L) 26.0 - 34.0 PG    MCHC 29.1 (L) 30.0 - 36.5 g/dL    RDW 16.1 (H) 11.5 - 14.5 %    PLATELET 222 150 - 400 K/uL    MPV 9.9 8.9 - 12.9 FL    NRBC 0.0 0 PER 100 WBC    ABSOLUTE NRBC 0.00 0.00 - 0.01 K/uL    NEUTROPHILS 61 32 - 75 %    LYMPHOCYTES 26 12 - 49 %    MONOCYTES 9 5 - 13 %    EOSINOPHILS 3 0 - 7 %    BASOPHILS 1 0 - 1 %    IMMATURE GRANULOCYTES 0 0.0 - 0.5 %    ABS. NEUTROPHILS 3.5 1.8 - 8.0 K/UL    ABS. LYMPHOCYTES 1.5 0.8 - 3.5 K/UL    ABS. MONOCYTES 0.5 0.0 - 1.0 K/UL     ABS. EOSINOPHILS 0.2 0.0 - 0.4 K/UL    ABS. BASOPHILS 0.0 0.0 - 0.1 K/UL    ABS. IMM. GRANS. 0.0 0.00 - 0.04 K/UL    DF AUTOMATED     METABOLIC PANEL, COMPREHENSIVE    Collection Time: 06/21/17  4:27 PM   Result Value Ref Range    Sodium 139 136 - 145 mmol/L    Potassium 4.2 3.5 - 5.1 mmol/L    Chloride 104 97 - 108 mmol/L    CO2 31 21 - 32 mmol/L    Anion gap 4 (L)  5 - 15 mmol/L    Glucose 71 65 - 100 mg/dL    BUN 14 6 - 20 MG/DL    Creatinine 0.81 0.55 - 1.02 MG/DL    BUN/Creatinine ratio 17 12 - 20      GFR est AA >60 >60 ml/min/1.48m    GFR est non-AA >60 >60 ml/min/1.727m   Calcium 8.7 8.5 - 10.1 MG/DL    Bilirubin, total 0.1 (L) 0.2 - 1.0 MG/DL    ALT (SGPT) 47 12 - 78 U/L    AST (SGOT) 42 (H) 15 - 37 U/L    Alk. phosphatase 207 (H) 45 - 117 U/L    Protein, total 6.9 6.4 - 8.2 g/dL    Albumin 3.2 (L) 3.5 - 5.0 g/dL    Globulin 3.7 2.0 - 4.0 g/dL    A-G Ratio 0.9 (L) 1.1 - 2.2     LIPASE    Collection Time: 06/21/17  4:27 PM   Result Value Ref Range    Lipase 169 73 - 393 U/L   URINALYSIS W/ REFLEX CULTURE    Collection Time: 06/21/17  4:27 PM   Result Value Ref Range    Color YELLOW/STRAW      Appearance CLEAR CLEAR      Specific gravity 1.020 1.003 - 1.030      pH (UA) 5.0 5.0 - 8.0      Protein NEGATIVE  NEG mg/dL    Glucose NEGATIVE  NEG mg/dL    Ketone NEGATIVE  NEG mg/dL    Bilirubin NEGATIVE  NEG      Blood NEGATIVE  NEG      Urobilinogen 0.2 0.2 - 1.0 EU/dL    Nitrites NEGATIVE  NEG      Leukocyte Esterase MODERATE (A) NEG      WBC PENDING /hpf    RBC PENDING /hpf    Epithelial cells PENDING /lpf    Bacteria PENDING /hpf    UA:UC IF INDICATED PENDING        Radiologic Studies -   No orders to display     Medical Decision Making   I am the first provider for this patient.    I reviewed the vital signs, available nursing notes, past medical history, past surgical history, family history and social history.    Vital Signs-Reviewed the patient's vital signs.  Patient Vitals for the past 12 hrs:    Temp Resp BP SpO2   06/21/17 1506 97.8 ??F (36.6 ??C) 18 122/64 ???   06/21/17 1504 ??? ??? ??? 100 %     Records Reviewed: Nursing Notes, Old Medical Records, Previous Radiology Studies and Previous Laboratory Studies    Provider Notes (Medical Decision Making):   DDx: medication side effect, ICH, hypertensive urgency/emergency, meningitis, nonspecific HA, constipation, cholecystitis, diverticulitis, UTI    ED Course:   Initial assessment performed. The patients presenting problems have been discussed, and they are in agreement with the care plan formulated and outlined with them.  I have encouraged them to ask questions as they arise throughout their visit.      PROGRESS NOTE:  5:09 PM  Pt has been re-evaluated. Pt is stating her restless leg is bothering her and does not want to stay for her CT or her medications. She states she wants to be discharged. Pt was informed that her medications were ordered but she does not want to wait.     Critical Care Time:   0    Disposition:  5:10  PM    ???I informed the pt that she needed further testing, admission, further observation for adequate evaluation for her headache, and that by refusing the above, she is at risk for sudden death, paralysis, coma.  She is awake, alert, and she understands her condition and the risks involved in leaving.     The patient has received no mind altering medications.    She is clinically aware of her surroundings and able to ask appropriate questions, the patient and the nurse present confirms she is not clinically intoxicated and able to make medical decisions. She verbalized knowing the risks and understood it was recommended that she stay and could also return at any time.  her self was present for the discussion and also verbalized that they understood both diagnosis, risks and could return at any time.  They were both provided with warnings regarding worsening of her condition and were provided instructions to follow up  with Flora Lipps, MD tomorrow or return to the Emergency Room as soon as possible. This discussion was witnessed by nurse Manya Silvas, RN.    PLAN:  1. Discharge  Current Discharge Medication List      START taking these medications    Details   traMADol (ULTRAM) 50 mg tablet Take 1 Tab by mouth every six (6) hours as needed for Pain. Max Daily Amount: 200 mg.  Qty: 20 Tab, Refills: 0    Associated Diagnoses: Nonintractable episodic headache, unspecified headache type         STOP taking these medications       oxyCODONE-acetaminophen (PERCOCET) 5-325 mg per tablet Comments:   Reason for Stopping:         ondansetron (ZOFRAN ODT) 4 mg disintegrating tablet Comments:   Reason for Stopping:             2.   Follow-up Information     Follow up With Specialties Details Why Contact Info    Flora Lipps, MD Internal Medicine In 1 day  [address]  [place] [address]  [phone]      [facility] EMERGENCY DEPT Emergency Medicine  As needed, If symptoms worsen [address]  [place]  [phone]        Return to ED if worse     Diagnosis     Clinical Impression:   1. Nonintractable episodic headache, unspecified headache type        Attestations:    This note is prepared by Betsey Amen, acting as Scribe for [employer]	[place]. Andreal Vultaggio, MD.    Irven Coe. Brinna Divelbiss, MD: The scribe's documentation has been prepared under my direction and personally reviewed by me in its entirety. I confirm that the note above accurately reflects all work, treatment, procedures, and medical decision making performed by me.        This note will not be viewable in [facility].

## 2017-06-23 LAB — CULTURE, URINE
Colonies Counted: 25000
Colony Count: 25000

## 2017-07-11 ENCOUNTER — Encounter: Attending: Internal Medicine | Primary: Internal Medicine

## 2017-07-29 NOTE — Other (Signed)
Patient Management Plan      Pt Name: Lindsay [name] DOB:12-15-1955      Management Plan by: HEDsUP Team                                                                                         Date Placed:  07/29/17   Pt's Physicians:         Primary Care:  Clarene Critchley, MD - [facility]): [phone]            Specialists:   Dr. Scarlett Presto Neurology [facility]): [phone]                          Dr. Theodis Shove, Psychiatry [phone]): [phone]                     Summary    Reason for Referral: This patient has been provided a management plan for Chronic Pain and Psychiatric Needs     Patient with frequent visits for: misc pain complaints, respiratory ???failure???, HA    Warning/Safety Alert:     **Pt has hx of falls**    PMP-   Prescriptions:  61  Prescribers: 14   Pharmacies: 4  (over 24 months)  Narx Report Scores and Risk Indicators: Narcotic: 440, Sedative: 481 ??? OD, Risk Score: 430.      This indicates the pt is 25 times more likely to experience an unintentional overdose than general population.  Guidelines recommend discussing concerns with pt/review use patterns for unsafe conditions.    Situation: Chronic Conditions Summary ???    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 Sporadic times (generally late morning into afternoon).  2 hospitalizations.    Goals/Interventions:     ? ED Provider/nursing(together) to discuss mgmt plan w/ pt  ? ED Provider to review PMP with pt  ? Nursing to obtain ordered UDS  ? Nursing/CM obtain updated list of current providers and attempt to obtain ROI for coordination of care   o Request/obtain consent for HEDsUP team member for follow up outreach.  ? Consider non-narcotic medications/alternatives to benzodiazepines unless emergently necessary.   o Consider subcutaneous medication administration as an alternate.  ? Educate patient on the appropriate use of ED. Provide Urgent Care and Dispatch Heath information  ? Include the ???Opioid overdose: Naloxone general Info??? in AVS.  ? Provide pt with High ED utilizer letter and [facility] Opioid Prescribing Guidelines   ? Consider contacting PCP???s office and/or neurologist during business hours advising pt is in ED.    During Business Hours: CM/PCP???s/psychiatry???s Office   After Hours: [facility](only if pt in crisis)    Challenges  for Self Management:     ? Impairment of cognition and/or functional limitations: Has RW and cane.  ? Financial Constraints:  Unknown  ? Poor follow ups   ? Utilization: HIGH ED Utilization  ? Emotional status of Patient: May be tearful  ? Behavioral Health Factors:  Anxious, restless (fidgety), nervous     Transportation: Prior to arranging transport, ask pt to call family member for ride home, if pt cannot locate ride, staff to call emergency contact and request ride for pt.     Medication Management: Complications from poor adherence, Poor Adherence, Poly-pharm    Advanced Care Plan: None              ** This plan has been created by the Care Coordination Committee, a multi-disciplinary team. The patient and their physician were invited to participate in this plan. This management plan is intended to provide consistent evaluation and treatment for this patient not to supersede physician judgment.**

## 2017-08-08 MED ORDER — ROPINIROLE 1 MG TAB
1 mg | ORAL_TABLET | ORAL | 1 refills | Status: DC
Start: 2017-08-08 — End: 2017-10-06

## 2017-08-12 NOTE — Telephone Encounter (Signed)
Patient requesting the name of the gi provider who saw her back in Sept 2018 as she could not recall the provider's name. Patient given info to Dr. Lorine BearsAlex Seamon with gastrointestinal specialists. Patient thanked me for the call back & voiced understanding.

## 2017-08-12 NOTE — Telephone Encounter (Signed)
Patient is requesting to speak with the nurse to get contact information for a Gastro provider that Dr Darl PikesGlynn referred her to for a colonoscopy, Patient can be reached at [phone]

## 2017-08-18 NOTE — Telephone Encounter (Signed)
Patient states she is currently in [place] staying in a condo & on Tues while putting on her shoes she feel fell over into the wall & hit her forehead, scraped her right knee & jammed her right elbow. She went to a local urgent care who performed x-rays on her elbow which were negative for fracture. Patient was given Rx for Mortin to use as needed. Patient reports severe pain in her elbow & the motrin is not helping. Advised she get 2nd opinion at a local hospital due to her her severe pain. Patient agreed & voiced understanding.

## 2017-08-18 NOTE — Telephone Encounter (Signed)
-----   Message from Dayna RamusLynesha M Johnson sent at 08/18/2017 11:22 AM EST -----  Regarding: Dr. Darl PikesGlynn/ Telephone  Pt states that she has fallen badly [place] in [place]. Pt is having pain in her knee, and elbow. She would like to know what else she can take, and the Motrin that she was recommended at the urgent care isn't working. Phone: [phone]

## 2017-08-19 NOTE — Telephone Encounter (Signed)
Patient states she took our advice & went [place] & got a 2nd opinion on her elbow & confirmed her elbow was fractured in 2 places. She returns home on Saturday & will see her orthopedic office on Monday for eval. The hospital gave her a prescription for pain meds & they are helping.

## 2017-08-19 NOTE — Telephone Encounter (Signed)
-----   Message from Lela A Thornton Papasollins Lockhart sent at 08/19/2017 11:47 AM EST -----  Regarding: Dr. Sabra HeckGlynn/Telephone  Pt broke elbow in 2 places and she is on vacation wold like a call back to discuss who she can follow up with as she is out of town or if she should wait until she returns, Best contact [phone]

## 2017-08-29 ENCOUNTER — Emergency Department: Admit: 2017-08-29 | Payer: BLUE CROSS/BLUE SHIELD | Primary: Internal Medicine

## 2017-08-29 ENCOUNTER — Inpatient Hospital Stay
Admit: 2017-08-29 | Discharge: 2017-08-29 | Disposition: A | Discharge status: Home / Self Care | Payer: BLUE CROSS/BLUE SHIELD | Attending: Emergency Medicine

## 2017-08-29 DIAGNOSIS — S0990XA Unspecified injury of head, initial encounter: Secondary | ICD-10-CM

## 2017-08-29 NOTE — ED Triage Notes (Signed)
Patient presents from home with complaints of fall that occurred on February 11th.  Patient arrives with arm in sling and states she broke her arm in 2 different places.  Patient was in [place] when she fell and went to the hospital at that time.  Patient did not realize that she hit her head until later and reports headaches since then

## 2017-08-29 NOTE — ED Provider Notes (Signed)
History of present illness: Patient fell 13 days ago while putting on a sock. She fractured her distal right humerus and possibly her radius. She has a long-arm splint and sling. She has a scrape on her right knee. She had her head and describes a hematoma at the frontal hairline. She complains of a vein above her right eye. She's had one second episodes of right eye blurring. She had some eye pain several days ago which has resolved. Patient had an MRI for a right-sided headache in the past which apparently showed findings consistent with multiple sclerosis. She called her neurologist today and the office told her to go to the ER. She has an orthopedic appointment in about 2 hours. She's had no trouble walking no trouble talking.      Past Medical History:   Diagnosis Date   ??? Abnormal brain scan    ??? Arthritis    ??? Chronic pain    ??? Depression    ??? DVT (deep venous thrombosis) (HCC)    ??? Falls frequently    ??? Fracture of left ankle    ??? Headache(784.0) 2011   ??? Ill-defined condition 09/15/2015    FX R HAND   ??? Other and unspecified hyperlipidemia 12/27/2012   ??? PE (pulmonary embolism) 12/2012   ??? RLS (restless legs syndrome) 03/16/2016    Dr Scarlett Presto   ??? TIA (transient ischemic attack) 03/2016    Right facial droop, numbness in lip   ??? Torn rotator cuff     right       Past Surgical History:   Procedure Laterality Date   ??? BREAST SURGERY PROCEDURE UNLISTED  2002    AUGMENTATION   ??? HX APPENDECTOMY     ??? HX CESAREAN SECTION      x4   ??? HX GI      COLONOSCOPY   ??? 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 HX ORTHOPAEDIC  08/2004    rotater cuff r. shoulder   ??? HX ORTHOPAEDIC      left ankle fracture treated with air cast   ??? HX ORTHOPAEDIC  6/13    left shoulder fxt and dislocation   ??? HX OTHER SURGICAL  2010    left foot torn tendon   ??? HX TUBAL LIGATION     ??? IMPLANT BREAST SILICONE/EQ  2002    Bilateral breast implants         Family History:   Problem Relation Age of Onset   ??? Heart Disease Mother     ??? Heart Disease Father    ??? Cancer Father         thyroid cancer   ??? Arthritis-rheumatoid Sister    ??? No Known Problems Son    ??? No Known Problems Daughter    ??? No Known Problems Daughter    ??? No Known Problems Daughter    ??? Anesth Problems Neg Hx        Social History     Socioeconomic History   ??? Marital status: MARRIED     Spouse name: Not on file   ??? Number of children: Not on file   ??? Years of education: Not on file   ??? Highest education level: Not on file   Social Needs   ??? Financial resource strain: Not on file   ??? Food insecurity - worry: Not on file   ??? Food insecurity - inability: Not on file   ??? Transportation needs - medical:  Not on file   ??? Transportation needs - non-medical: Not on file   Occupational History   ??? Not on file   Tobacco Use   ??? Smoking status: Never Smoker   ??? Smokeless tobacco: Never Used   Substance and Sexual Activity   ??? Alcohol use: No     Comment: Pt denies   ??? Drug use: No   ??? Sexual activity: Yes     Partners: Male   Other Topics Concern   ??? Not on file   Social History Narrative   ??? Not on file         ALLERGIES: Patient has no known allergies.    Review of Systems   All other systems reviewed and are negative.      Vitals:    08/29/17 1104 08/29/17 1142   BP:  109/70   Pulse: 91 78   Resp:  14   Temp:  98.3 ??F (36.8 ??C)   SpO2: 95% 97%            Physical Exam   Constitutional: She appears well-developed and well-nourished.   HENT:   Head: Normocephalic and atraumatic.   Mouth/Throat: Oropharynx is clear and moist.   Eyes: EOM are normal. Pupils are equal, round, and reactive to light.   Neck: Normal range of motion. Neck supple.   Cardiovascular: Normal rate, regular rhythm, normal heart sounds and intact distal pulses. Exam reveals no gallop and no friction rub.   No murmur heard.  Pulmonary/Chest: Effort normal. No respiratory distress. She has no wheezes. She has no rales.   Abdominal: Soft. There is no tenderness. There is no rebound.    Musculoskeletal: Normal range of motion. She exhibits no tenderness.   Long arm splint RUE   Neurological: She is alert. No cranial nerve deficit.   Motor; symmetric   Skin: No erythema.   Psychiatric: She has a normal mood and affect. Her behavior is normal.   Nursing note and vitals reviewed.       MDM       Procedures

## 2017-08-29 NOTE — Telephone Encounter (Signed)
Noted  

## 2017-08-29 NOTE — Telephone Encounter (Signed)
Patient reports a fall several days ago, in which she hit her head. She reports facial bruising and "a knot" on her head. This AM she has had some visual disturbances. Referred patient to ED. [facility].

## 2017-08-29 NOTE — Telephone Encounter (Signed)
2/25--Message from Hello, "pls cl re: fell and hit head, elbow, and knee req call back."

## 2017-08-29 NOTE — ED Notes (Signed)
Discharge instructions given by Dr. Gill

## 2017-09-06 NOTE — Progress Notes (Signed)
ED Discharge Follow-Up    Date/Time:     09/07/2017 11:04 PM    [name] on 08/[facility]/2017 and was diagnosed with Head injury from fall.       Top Challenges reviewed with the provider   Very tearful.  In pain.  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  Malissa Hippoarol Zogren will call her for appointment.       Method of communication with provider :face to face, staff message    Nurse Navigator(NN) contacted the  patient  by telephone to perform post ED discharge assessment. Verified name and DOB with patient as identifiers. Provided introduction to self, and explanation of the Nurse Navigator role.      Patient reported assessment: Patient was tearful and very stressed. She declined an appt with Dr. Darl PikesGlynn at this time. She reported visit with Ortho yesterday and they need to keep the cast on for at least 2 more weeks. 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     Medication(s):   New Medications at Discharge: None  Changed Medications at Discharge: None  Discontinued Medications at Discharge: None    There were no barriers to obtaining medications identified at this time.    Reviewed discharge instructions and red flags with  patient who voiced understanding. Patient given an opportunity to ask questions and does not have any further questions or concerns at this time. The patient agrees to contact the PCP office for questions related to their healthcare.     Patient reminded that there are physicians on call 24 hours a day / 7 days a week (M-F 5pm to 8am and from Friday 5pm until Monday 8am for the weekend) should the patient have questions or concerns. NN provided contact information for future reference.      Offered follow up appointment with PCP: Yes, she declined appointment at this time. Will schedule next week. Malissa HippoCarol Zogren will call her to schedule an appointment to help with her stressful situation.   [facility] follow up appointment(s):    Future Appointments   Date Time Provider Department Center   09/13/2017  1:40 PM Jodi Mourningansom, Mary M, MD [facility] SCHED      Non-[facility] follow up appointment(s): Reports [facility].  [facility]:  n/a    [email or web address] 9 AM- 9 PM.   Phone [phone]      Goals        General    ??? Will follow up with therapist for support through stressful time. (pt-stated)      09/06/2017 Message sent to Therapist at PCP office to assist with support through stressful events in patients life. PAC

## 2017-09-07 NOTE — Progress Notes (Signed)
Lm need to see if patient saw Neuropsych.

## 2017-09-09 NOTE — Telephone Encounter (Signed)
Noted. Will forward to Dr. Ransom.

## 2017-09-09 NOTE — Telephone Encounter (Signed)
Noted. CT head -neg. CT Cspine with diffuse degenerative changes

## 2017-09-09 NOTE — Telephone Encounter (Signed)
Pt scheduled her neuropsych testing for April. Pt cannot remember the day right now. She rescheduled her f/u appt from 3/12 to 5/28. She also wanted to let Dr. Alison Murrayansom know she hit her head when she fell recently (2/25) and [facility]. Please call with any questions.

## 2017-09-13 ENCOUNTER — Encounter: Attending: Neurology | Primary: Internal Medicine

## 2017-10-06 MED ORDER — ROPINIROLE 1 MG TAB
1 mg | ORAL_TABLET | ORAL | 0 refills | Status: DC
Start: 2017-10-06 — End: 2017-11-05

## 2017-10-30 ENCOUNTER — Emergency Department: Admit: 2017-10-30 | Payer: BLUE CROSS/BLUE SHIELD | Primary: Internal Medicine

## 2017-10-30 ENCOUNTER — Inpatient Hospital Stay
Admit: 2017-10-30 | Discharge: 2017-10-31 | Disposition: A | Discharge status: Home / Self Care | Payer: BLUE CROSS/BLUE SHIELD | Attending: Emergency Medicine

## 2017-10-30 ENCOUNTER — Emergency Department

## 2017-10-30 DIAGNOSIS — N390 Urinary tract infection, site not specified: Secondary | ICD-10-CM

## 2017-10-30 LAB — URINALYSIS W/ REFLEX CULTURE
Blood: NEGATIVE
Glucose: NEGATIVE mg/dL
Nitrites: NEGATIVE
Specific gravity: 1.03 (ref 1.003–1.030)
Urobilinogen: 1 EU/dL (ref 0.2–1.0)
pH (UA): 6 (ref 5.0–8.0)

## 2017-10-30 LAB — BILIRUBIN, CONFIRM: Bilirubin UA, confirm: NEGATIVE

## 2017-10-30 LAB — DRUG SCREEN, URINE
AMPHETAMINES: NEGATIVE
BARBITURATES: NEGATIVE
BENZODIAZEPINES: POSITIVE — AB
COCAINE: NEGATIVE
METHADONE: NEGATIVE
OPIATES: NEGATIVE
PCP(PHENCYCLIDINE): NEGATIVE
THC (TH-CANNABINOL): NEGATIVE

## 2017-10-30 MED ORDER — NEOMYCIN-BACITRACN ZN-POLYMYXIN 3.5 MG-400 UNIT-5,000 UNIT TOP OINT PK
3.5-400-5000 mg-unit-unit | CUTANEOUS | Status: AC
Start: 2017-10-30 — End: 2017-10-30
  Administered 2017-10-30: 22:00:00 via TOPICAL

## 2017-10-30 MED ORDER — HYDROCODONE-ACETAMINOPHEN 5 MG-325 MG TAB
5-325 mg | ORAL_TABLET | Freq: Four times a day (QID) | ORAL | 0 refills | Status: AC | PRN
Start: 2017-10-30 — End: 2017-11-02

## 2017-10-30 MED ORDER — HYDROCODONE-ACETAMINOPHEN 5 MG-325 MG TAB
5-325 mg | ORAL | Status: AC
Start: 2017-10-30 — End: 2017-10-30
  Administered 2017-10-30: 22:00:00 via ORAL

## 2017-10-30 MED ORDER — CEPHALEXIN 250 MG CAP
250 mg | ORAL | Status: AC
Start: 2017-10-30 — End: 2017-10-30
  Administered 2017-10-30: 23:00:00 via ORAL

## 2017-10-30 MED ORDER — CEPHALEXIN 500 MG CAP
500 mg | ORAL_CAPSULE | Freq: Three times a day (TID) | ORAL | 0 refills | Status: AC
Start: 2017-10-30 — End: 2017-11-06

## 2017-10-30 MED FILL — CEPHALEXIN 250 MG CAP: 250 mg | ORAL | Qty: 2

## 2017-10-30 MED FILL — TRIPLE ANTIBIOTIC 3.5 MG-400 UNIT-5,000 UNIT TOPICAL OINTMENT PACKET: 3.5-400-5000 mg-unit-unit | CUTANEOUS | Qty: 1

## 2017-10-30 MED FILL — HYDROCODONE-ACETAMINOPHEN 5 MG-325 MG TAB: 5-325 mg | ORAL | Qty: 1

## 2017-10-30 NOTE — ED Notes (Signed)
Assumed care of pt from EMS. Pt with abrasions to right side of face and forehead. Reports pain to teeth, nose, right shoulder, right elbow, right wrist, and right ankle. Denies LOC. c collar placed by EMS. Pt denies neck pain. Positioned for comfort on stretcher. Call bell within reach. Updated on plan of care.

## 2017-10-30 NOTE — ED Notes (Signed)
Pt to xray via wheelchair.

## 2017-10-30 NOTE — ED Provider Notes (Signed)
EMERGENCY DEPARTMENT HISTORY AND PHYSICAL EXAM          Date: 10/30/2017  Patient Name: Lindsay [name]    History of Presenting Illness     Chief Complaint   Patient presents with   ??? Fall     trip and fall on curb   ??? Abrasion     to face   ??? Ankle Injury     right   ??? Shoulder Injury     right   ??? Elbow Injury     right   ??? Wrist Injury     right       History Provided By: Patient and Patient's Husband    HPI: Lindsay [name] is a 62 y.o. female, pmhx right elbow fx in February of 2019, who presents via EMS to the ED c/o mechanical trip and fall on a curb just prior to arrival.  She denies lightheadedness dizziness chest pain.  She feels that she did lift her foot high enough to step on the curb she fell forward and struck the right side of her face on concrete.  She denies loss of consciousness.  She has not vomited.  She has pain all along her right upper extremity and her right ankle.  She has been prescribed Norco by her orthopedic physician Dr. Katrinka Blazing for her broken arm.      She specifically denies any recent fevers, chills, nausea, vomiting, diarrhea, abd pain, CP, urinary sxs, changes in BM, or headache.       PCP: Clarene Critchley, MD   Orthopedist: Dr. Katrinka Blazing, [facility]    There are no other complaints, changes, or physical findings at this time.     Current Outpatient Medications   Medication Sig Dispense Refill   ??? rOPINIRole (REQUIP) 1 mg tablet TAKE 4 TABLETS BY MOUTH 1 TO 3 HOURS BEFORE BEDTIME EVERYDAY 120 Tab 0   ??? traMADol (ULTRAM) 50 mg tablet Take 1 Tab by mouth every six (6) hours as needed for Pain. Max Daily Amount: 200 mg. 20 Tab 0   ??? ibuprofen (MOTRIN) 600 mg tablet Take 1 Tab by mouth every six (6) hours as needed for Pain. 20 Tab 0   ??? gabapentin enacarbil (HORIZANT) 600 mg TbER Take 1 Tab by mouth two (2) times a day. 60 Tab 11   ??? buPROPion XL (WELLBUTRIN XL) 150 mg tablet TK 1 T PO  D.  0   ??? pravastatin (PRAVACHOL) 40 mg tablet 1 po every day Please make a med  check appointment in the next month. Thank you. 30 Tab 0   ??? diazePAM (VALIUM) 5 mg tablet Take 10 mg by mouth two (2) times a day. 5 mg every morning and 10 mg QHS  5   ??? aspirin 81 mg chewable tablet Take 1 Tab by mouth daily. 30 Tab 0   ??? ergocalciferol (ERGOCALCIFEROL) 50,000 unit capsule Take 1 Cap by mouth every seven (7) days. 12 Cap 1   ??? escitalopram oxalate (LEXAPRO) 20 mg tablet Take 1 Tab by mouth daily. 30 Tab 5   ??? omeprazole (PRILOSEC) 20 mg capsule Take 20 mg by mouth nightly.         Past History     Past Medical History:  Past Medical History:   Diagnosis Date   ??? Abnormal brain scan    ??? Arthritis    ??? Chronic pain    ??? Depression    ??? DVT (deep venous thrombosis) (  HCC)    ??? Falls frequently    ??? Fracture of left ankle    ??? Headache(784.0) 2011   ??? Ill-defined condition 09/15/2015    FX R HAND   ??? Other and unspecified hyperlipidemia 12/27/2012   ??? PE (pulmonary embolism) 12/2012   ??? RLS (restless legs syndrome) 03/16/2016    Dr Scarlett Presto   ??? TIA (transient ischemic attack) 03/2016    Right facial droop, numbness in lip   ??? Torn rotator cuff     right       Past Surgical History:  Past Surgical History:   Procedure Laterality Date   ??? BREAST SURGERY PROCEDURE UNLISTED  2002    AUGMENTATION   ??? HX APPENDECTOMY     ??? HX CESAREAN SECTION      x4   ??? HX GI      COLONOSCOPY   ??? 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 HX ORTHOPAEDIC  08/2004    rotater cuff r. shoulder   ??? HX ORTHOPAEDIC      left ankle fracture treated with air cast   ??? HX ORTHOPAEDIC  6/13    left shoulder fxt and dislocation   ??? HX OTHER SURGICAL  2010    left foot torn tendon   ??? HX TUBAL LIGATION     ??? IMPLANT BREAST SILICONE/EQ  2002    Bilateral breast implants       Family History:  Family History   Problem Relation Age of Onset   ??? Heart Disease Mother    ??? Heart Disease Father    ??? Cancer Father         thyroid cancer   ??? Arthritis-rheumatoid Sister    ??? No Known Problems Son    ??? No Known Problems Daughter     ??? No Known Problems Daughter    ??? No Known Problems Daughter    ??? Anesth Problems Neg Hx        Social History:  Social History     Tobacco Use   ??? Smoking status: Never Smoker   ??? Smokeless tobacco: Never Used   Substance Use Topics   ??? Alcohol use: No     Comment: Pt denies   ??? Drug use: No       Allergies:  No Known Allergies      Review of Systems   Review of Systems   Constitutional: Negative for activity change, appetite change, fatigue and fever.   HENT: Negative for congestion, dental problem, ear pain, rhinorrhea and sinus pressure.    Eyes: Negative for photophobia, pain, discharge, redness, itching and visual disturbance.   Respiratory: Negative for cough, chest tightness, shortness of breath, wheezing and stridor.    Cardiovascular: Negative for chest pain and leg swelling.   Gastrointestinal: Negative for abdominal distention, abdominal pain and blood in stool.   Genitourinary: Negative for difficulty urinating, dysuria, flank pain, frequency, vaginal bleeding, vaginal discharge and vaginal pain.   Musculoskeletal: Positive for arthralgias. Negative for back pain, gait problem, joint swelling and neck pain.   Skin: Positive for wound. Negative for rash.   Neurological: Negative for dizziness, syncope, weakness, numbness and headaches.   Psychiatric/Behavioral: Negative for behavioral problems. The patient is not nervous/anxious.         Physical Exam   Physical Exam   Constitutional: She is oriented to person, place, and time. Vital signs are normal. She appears well-developed and well-nourished. She is cooperative. No distress.  Ambulatory   HENT:   Head: Normocephalic. Head is with abrasion.   Right Ear: External ear normal.   Left Ear: External ear normal.   Nose: Nose normal.   Mouth/Throat: Oropharynx is clear and moist. No oropharyngeal exudate.   Eyes: Pupils are equal, round, and reactive to light. Conjunctivae and EOM are normal. Right eye exhibits no discharge. Left eye exhibits no  discharge. No scleral icterus.   Neck: Normal range of motion. Neck supple. No tracheal deviation present.   Cardiovascular: Normal rate, regular rhythm, normal heart sounds and intact distal pulses. Exam reveals no gallop and no friction rub.   No murmur heard.  Pulmonary/Chest: Effort normal and breath sounds normal. No respiratory distress. She has no wheezes. She has no rales. She exhibits no tenderness.   Abdominal: Soft. Bowel sounds are normal. She exhibits no distension and no mass. There is no tenderness. There is no rebound and no guarding.   Musculoskeletal: She exhibits no edema.        Right shoulder: She exhibits tenderness and pain. She exhibits no swelling, no deformity and no spasm.        Right elbow: Normal.       Right wrist: She exhibits tenderness. She exhibits normal range of motion and no bony tenderness.        Right hip: Normal.        Right knee: She exhibits normal range of motion. Tenderness found.        Right ankle: She exhibits no deformity. Tenderness.        Right upper leg: Normal.        Right lower leg: Normal.        Right foot: Normal.   Professional abrasion and contusion to the superior right shoulder.  Range of motion of the right elbow no tenderness to palpation of the distal humerus.  AIN/PIN/radial/ulnar motor nerves intact distal right upper extremity.   Lymphadenopathy:     She has no cervical adenopathy.   Neurological: She is alert and oriented to person, place, and time. No cranial nerve deficit or sensory deficit. She exhibits normal muscle tone. Coordination and gait normal. GCS eye subscore is 4. GCS verbal subscore is 5. GCS motor subscore is 6.   Cranial nerves II through XII grossly intact   Skin: Skin is warm and dry. Abrasion noted. No rash noted. No erythema.   Facial abrasion around the right orbit.   Psychiatric: She has a normal mood and affect. Her behavior is normal.   Nursing note and vitals reviewed.      Diagnostic Study Results     Labs -      Recent Results (from the past 12 hour(s))   DRUG SCREEN, URINE    Collection Time: 10/30/17  5:30 PM   Result Value Ref Range    AMPHETAMINES NEGATIVE  NEG      BARBITURATES NEGATIVE  NEG      BENZODIAZEPINES POSITIVE (A) NEG      COCAINE NEGATIVE  NEG      METHADONE NEGATIVE  NEG      OPIATES NEGATIVE  NEG      PCP(PHENCYCLIDINE) NEGATIVE  NEG      THC (TH-CANNABINOL) NEGATIVE  NEG      Drug screen comment (NOTE)    URINALYSIS W/ REFLEX CULTURE    Collection Time: 10/30/17  5:30 PM   Result Value Ref Range    Color YELLOW/STRAW      Appearance CLEAR  CLEAR      Specific gravity 1.030 1.003 - 1.030      pH (UA) 6.0 5.0 - 8.0      Protein TRACE (A) NEG mg/dL    Glucose NEGATIVE  NEG mg/dL    Ketone TRACE (A) NEG mg/dL    Blood NEGATIVE  NEG      Urobilinogen 1.0 0.2 - 1.0 EU/dL    Nitrites NEGATIVE  NEG      Leukocyte Esterase MODERATE (A) NEG      WBC 50-100 0 - 4 /hpf    RBC 0-5 0 - 5 /hpf    Epithelial cells FEW FEW /lpf    Bacteria 1+ (A) NEG /hpf    UA:UC IF INDICATED URINE CULTURE ORDERED (A) CNI      Mucus 2+ (A) NEG /lpf   BILIRUBIN, CONFIRM    Collection Time: 10/30/17  5:30 PM   Result Value Ref Range    Bilirubin UA, confirm NEGATIVE  NEG         Radiologic Studies -   XR WRIST RT AP/LAT/OBL MIN 3V   Final Result   IMPRESSION: No acute abnormality.      XR SPINE CERV 4 OR 5 V   Final Result   IMPRESSION: Osteopenia and degenerative changes. No acute fracture or   dislocation demonstrated.      XR SHOULDER RT AP/LAT MIN 2 V   Final Result   IMPRESSION: No acute abnormality.      XR KNEE RT MIN 4 V   Final Result   IMPRESSION: No acute abnormality.      XR ELBOW RT MIN 3 V   Final Result   IMPRESSION: Supracondylar fracture of the humerus and mild posterior angular   deformity. Osteopenia. Elbow osteoarthrosis.      XR ANKLE RT AP/LAT   Final Result   IMPRESSION: 2 view study. No acute fracture or dislocation demonstrated.      CT MAXILLOFACIAL WO CONT    (Results Pending)     CT Results  (Last 48 hours)     None        CXR Results  (Last 48 hours)    None            Medical Decision Making   I am the first provider for this patient.    I reviewed the vital signs, available nursing notes, past medical history, past surgical history, family history and social history.    Vital Signs-Reviewed the patient's vital signs.  Patient Vitals for the past 12 hrs:   Temp Pulse Resp BP SpO2   10/30/17 1718 98 ??F (36.7 ??C) 61 16 115/86 97 %       Records Reviewed: Nursing Notes, Old Medical Records, Previous Laboratory Studies and EDIE, care management plan, PMP    Provider Notes (Medical Decision Making):     DDx: Fracture, sprain, strain, abrasion, urinary tract infection    ED Course:   Initial assessment performed. The patients presenting problems have been discussed, and they are in agreement with the care plan formulated and outlined with them.  I have encouraged them to ask questions as they arise throughout their visit.        PROGRESS NOTE:     Procedure Note - C-collar removed:   7:00 PM  Performed by: Germaine Pomfret PA-C  C-spine cleared using NEXUS criteria. C-collar removed.       7:00 PM  Discussed plain film x-ray results with patient.  Patient has no bony tenderness of her distal humerus where fracture is.  We will have access to her [facility] x-rays to compare old versus new fracture.  She has a custom  Posterior arm splint from [facility] at home.  She will put it on when she gets there.    7:08 PM  Educated patient on urinary tract infections in women past menopause.      7:59 PM  Pt noted to be feeling better , ready for discharge. Updated pt and/or family on available findings.  Will follow up as instructed. All questions have been answered, pt voiced understanding and agreement with plan.  Narcotics were prescribed, pt was advised not to drive or operate heavy machinery. Abx were prescribed, pt advised that diarrhea and rash are possible side effects of the medications. Specific return precautions  provided as well as instructions to return to the ED should sx worsen at any time. Vital signs stable for discharge.   PA-C Felipa Furnace    Disposition:    DISCHARGE NOTE:  7:59 PM  The patient's results have been reviewed with patient and her husband.  They verbally convey their understanding and agreement of the patient's signs, symptoms, diagnosis, treatment, and prognosis. They additionally agree to follow up as recommended in the discharge instructions or to return to the Emergency Room should the patient's condition change prior to their follow-up appointment. The patient verbally agrees with the care-plan and all of their questions have been answered. The discharge instructions have also been provided to the them along with educational information regarding the patient's diagnosis and a list of reasons why the patient would want to return to the ER prior to their follow-up appointment should their condition change.  PA-C Felipa Furnace    PLAN:  1.   Discharge Medication List as of 10/30/2017  7:59 PM      START taking these medications    Details   HYDROcodone-acetaminophen (NORCO) 5-325 mg per tablet Take 1 Tab by mouth every six (6) hours as needed for Pain for up to 3 days. Max Daily Amount: 4 Tabs., Print, Disp-10 Tab, R-0      cephALEXin (KEFLEX) 500 mg capsule Take 1 Cap by mouth three (3) times daily for 7 days., Print, Disp-21 Cap, R-0         CONTINUE these medications which have NOT CHANGED    Details   rOPINIRole (REQUIP) 1 mg tablet TAKE 4 TABLETS BY MOUTH 1 TO 3 HOURS BEFORE BEDTIME EVERYDAY, Normal, Disp-120 Tab, R-0      ibuprofen (MOTRIN) 600 mg tablet Take 1 Tab by mouth every six (6) hours as needed for Pain., Print, Disp-20 Tab, R-0      gabapentin enacarbil (HORIZANT) 600 mg TbER Take 1 Tab by mouth two (2) times a day., Normal, Disp-60 Tab, R-11      buPROPion XL (WELLBUTRIN XL) 150 mg tablet TK 1 T PO  D., Historical Med, R-0       pravastatin (PRAVACHOL) 40 mg tablet 1 po every day Please make a med check appointment in the next month. Thank you., Normal, Disp-30 Tab, R-0      aspirin 81 mg chewable tablet Take 1 Tab by mouth daily., Print, Disp-30 Tab, R-0      ergocalciferol (ERGOCALCIFEROL) 50,000 unit capsule Take 1 Cap by mouth every seven (7) days., Normal, Disp-12 Cap, R-1      escitalopram oxalate (LEXAPRO) 20 mg tablet Take 1 Tab by mouth daily., Normal,  Disp-30 Tab, R-5      omeprazole (PRILOSEC) 20 mg capsule Take 20 mg by mouth nightly., Historical Med         STOP taking these medications       traMADol (ULTRAM) 50 mg tablet Comments:   Reason for Stopping:         diazePAM (VALIUM) 5 mg tablet Comments:   Reason for Stopping:             2.   Follow-up Information     Follow up With Specialties Details Why Contact Info    Katrinka Blazing, Julious(Jody) P, MD Orthopedic Surgery Schedule an appointment as soon as possible for a visit  [address] [facility] [place] [address]  [phone]      [facility] EMERGENC[facility] Emergency Medicine  If symptoms worsen [address]  [facility] [place] [address]  [phone]        Return to ED if worse     Diagnosis     Clinical Impression:   1. Acute UTI (urinary tract infection)    2. Minor head injury, initial encounter    3. Abrasion of periorbital region of face, initial encounter    4. Closed nondisplaced simple supracondylar fracture of right humerus without intercondylar fracture with nonunion, subsequent encounter            Please note that this dictation was completed with Dragon, the computer voice recognition software. Quite often unanticipated grammatical, syntax, homophones, and other interpretive errors are inadvertently transcribed by the computer software. Please disregards these errors. Please excuse any errors that have escaped final proofreading.   This note will not be viewable in MyChart.

## 2017-11-01 LAB — CULTURE, URINE
Colonies Counted: 80000
Colony Count: 80000

## 2017-11-07 MED ORDER — ROPINIROLE 1 MG TAB
1 mg | ORAL_TABLET | ORAL | 0 refills | Status: DC
Start: 2017-11-07 — End: 2017-12-07

## 2017-11-23 NOTE — Progress Notes (Signed)
I left a message for patient to determine if she has seen neuropsych.

## 2017-11-23 NOTE — Progress Notes (Signed)
I left a message for patient to determine if she has seen neuropsych.

## 2017-11-29 ENCOUNTER — Ambulatory Visit: Admit: 2017-11-29 | Payer: PRIVATE HEALTH INSURANCE | Attending: Neurology | Primary: Internal Medicine

## 2017-11-29 ENCOUNTER — Ambulatory Visit: Attending: Neurology | Primary: Internal Medicine

## 2017-11-29 DIAGNOSIS — G2581 Restless legs syndrome: Secondary | ICD-10-CM

## 2017-11-29 NOTE — Progress Notes (Signed)
 Chief Complaint   Patient presents with   . Neurologic Problem     4 month f/u   . Tremors     Right    . Fall     1. Have you been to the ER, urgent care clinic since your last visit?  Hospitalized since your last visit?Yes When: 10/30/17 Where: [facility] Reason for visit: Fall    2. Have you seen or consulted any other health care providers outside of the [facility] since your last visit?  Include any pap smears or colon screening. No    3) Do you have an Advance Directive on file? no
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
## 2017-11-29 NOTE — Progress Notes (Signed)
Chief Complaint   Patient presents with   ??? Neurologic Problem     4 month f/u   ??? Tremors     Right    ??? Fall     1. Have you been to the ER, urgent care clinic since your last visit?  Hospitalized since your last visit?Yes When: 10/30/17 Where: [facility] Reason for visit: Fall    2. Have you seen or consulted any other health care providers outside of the [facility] since your last visit?  Include any pap smears or colon screening. No    3) Do you have an Advance Directive on file? no

## 2017-12-07 MED ORDER — ROPINIROLE 1 MG TAB
1 mg | ORAL_TABLET | ORAL | 0 refills | Status: DC
Start: 2017-12-07 — End: 2018-01-03

## 2018-01-03 MED ORDER — ROPINIROLE 1 MG TAB
1 mg | ORAL_TABLET | ORAL | 2 refills | Status: DC
Start: 2018-01-03 — End: 2018-05-01

## 2018-03-20 MED ORDER — HORIZANT ER 600 MG TABLET,EXTENDED RELEASE
600 mg | ORAL_TABLET | ORAL | 0 refills | Status: DC
Start: 2018-03-20 — End: 2018-04-20

## 2018-03-20 NOTE — Telephone Encounter (Signed)
Patient stating that she is in need of rx. Patient is completely out of medication. Please advise.    Requested Prescriptions     Pending Prescriptions Disp Refills   ??? HORIZANT 600 mg TbER [Pharmacy Med Name: HORIZANT 600MG (GABAPENTIN ENA ER)TB] 60 Tab 0     Sig: TAKE 1 TABLET BY MOUTH TWICE DAILY

## 2018-03-22 NOTE — Telephone Encounter (Signed)
-----   Message from Alexia A Adderley sent at 03/22/2018 11:59 AM EDT -----  Regarding: Dr. Ellouise Neweransom/refill  Medication Refill    Caller (if not patient):      Relationship of caller (if not patient):      Best contact number(s): [phone]      Name of medication and dosage if known: Horizant 600mg       Is patient out of this medication (yes/no): Yes      [facility]     Pharmacy listed in chart? (yes/no):  Pharmacy phone number:      Details to clarify the request: Pt advised that the pharmacy is requesting a new prescription for the Horizant before they will give it to her because of the drug changes on 01/02/18.      Alexia A Adderley

## 2018-03-22 NOTE — Telephone Encounter (Signed)
I spoke with pharmacist at local pharmacy. Pharmacist states Horizant can not be e-scribed. It needs a hard script faxed in or can be called in. Horizant order from 9/16 was e-scribed. Provided verbal order per Dr.Ransom's order from 9/16.

## 2018-03-22 NOTE — Telephone Encounter (Signed)
Lindsay CumminsJesse - Please look at refill from 03/20/18.

## 2018-04-04 ENCOUNTER — Ambulatory Visit: Admit: 2018-04-04 | Payer: PRIVATE HEALTH INSURANCE | Attending: Neurology | Primary: Internal Medicine

## 2018-04-04 ENCOUNTER — Ambulatory Visit: Attending: Neurology | Primary: Internal Medicine

## 2018-04-04 DIAGNOSIS — R269 Unspecified abnormalities of gait and mobility: Secondary | ICD-10-CM

## 2018-04-04 NOTE — Progress Notes (Signed)
Neurology Progress Note    HISTORY PROVIDED BY: patient and daughter    Chief Complaint:   Chief Complaint   Patient presents with   ??? Neurologic Problem      Subjective:   Pt is a 62 y.o. right handed female last seen in clinic on 11/29/17 in f/u for h/o radiographic changes c/w MS, but no clinical symptoms, MRI brain 02/17/17 was stable.  RLS, with an ill-defined irritating sensation in her legs with urge to move her legs beginning around 6PM and keeping her awake and inhibiting her activities, though question if there may be some component of psych issues contributing to her need to walk. Additionally, c/o memory loss, suspected due to underlying mood disorder/personality d/o, and stress, confirmed on neuropsychological testing with Dr. Maren Reamer in April, 2019.  Pt reported falls, all of which sound situational and not surprising in circumstances, unrelated to an underlying neurological issue. Exam was stable, with near continuous talking, bilateral rotary nystagmus worse at end gaze to right, right ptosis, o/w unremarkable. Continued Horizant 600mg  bid and Requip 4mg  qhs for RLS,   Continued psychotherapy for mood disorders/stress.     She returns for f/u. She reports additional falls, 3 times in one week two weeks ago, with one was feeding farm cats, got feet tangled up, had trouble getting up had to slide herself up a brick wall. Another time, she was trying to get something out of a cabinet and stepped over something to reach and legs slid apart and she fell.  Reports being unsteady on feet, "weeble wobbles."  Has notices mild tremors in hands.  Mentions her "eyes have been funny", eyes were watering after eating a meal outside.  Drove home and started having double vision the whole way home, talked herself home, which is the part that seems most concerning to her, she isn't sure why she didn't pull over to call her husband to pick her up, though she did think about doing that. Wants to know if this poor  judgement is a sign she was having a stroke. If she covered one eye she still had double vision, while she covered one eye with one hand she hit a mailbox with the side mirror and then proceeded to cover the other eye and she scraped the car on bushes on the other side. Having word finding at times.  She is not taking ASA, states she didn't know she is supposed to take it.  Not taking Pravachol, b/c she just hasn't seen PCP for refill.      Past Medical History:   Diagnosis Date   ??? Abnormal brain scan    ??? Arthritis    ??? Chronic pain    ??? Depression    ??? DVT (deep venous thrombosis) (HCC)    ??? Falls frequently    ??? Fracture of left ankle    ??? Headache(784.0) 2011   ??? Ill-defined condition 09/15/2015    FX R HAND   ??? Other and unspecified hyperlipidemia 12/27/2012   ??? PE (pulmonary embolism) 12/2012   ??? RLS (restless legs syndrome) 03/16/2016    Dr Scarlett Presto   ??? TIA (transient ischemic attack) 03/2016    Right facial droop, numbness in lip   ??? Torn rotator cuff     right      Past Surgical History:   Procedure Laterality Date   ??? BREAST SURGERY PROCEDURE UNLISTED  2002    AUGMENTATION   ??? HX APPENDECTOMY     ???  HX CESAREAN SECTION      x4   ??? HX GI      COLONOSCOPY   ??? 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 HX ORTHOPAEDIC  08/2004    rotater cuff r. shoulder   ??? HX ORTHOPAEDIC      left ankle fracture treated with air cast   ??? HX ORTHOPAEDIC  6/13    left shoulder fxt and dislocation   ??? HX OTHER SURGICAL  2010    left foot torn tendon   ??? HX TUBAL LIGATION     ??? IMPLANT BREAST SILICONE/EQ  2002    Bilateral breast implants      Social History     Socioeconomic History   ??? Marital status: MARRIED     Spouse name: Not on file   ??? Number of children: Not on file   ??? Years of education: Not on file   ??? Highest education level: Not on file   Occupational History   ??? Not on file   Social Needs   ??? Financial resource strain: Not on file   ??? Food insecurity:     Worry: Not on file     Inability: Not on file   ???  Transportation needs:     Medical: Not on file     Non-medical: Not on file   Tobacco Use   ??? Smoking status: Never Smoker   ??? Smokeless tobacco: Never Used   Substance and Sexual Activity   ??? Alcohol use: No     Comment: Pt denies   ??? Drug use: No   ??? Sexual activity: Yes     Partners: Male   Lifestyle   ??? Physical activity:     Days per week: Not on file     Minutes per session: Not on file   ??? Stress: Not on file   Relationships   ??? Social connections:     Talks on phone: Not on file     Gets together: Not on file     Attends religious service: Not on file     Active member of club or organization: Not on file     Attends meetings of clubs or organizations: Not on file     Relationship status: Not on file   ??? Intimate partner violence:     Fear of current or ex partner: Not on file     Emotionally abused: Not on file     Physically abused: Not on file     Forced sexual activity: Not on file   Other Topics Concern   ??? Not on file   Social History Narrative   ??? Not on file     Family History   Problem Relation Age of Onset   ??? Heart Disease Mother    ??? Heart Disease Father    ??? Cancer Father         thyroid cancer   ??? Arthritis-rheumatoid Sister    ??? No Known Problems Son    ??? No Known Problems Daughter    ??? No Known Problems Daughter    ??? No Known Problems Daughter    ??? Anesth Problems Neg Hx           Objective:   ROS:  Per HPI or PMH o/w neg    No Known Allergies    Meds:  Outpatient Medications Prior to Visit   Medication Sig Dispense Refill   ??? HORIZANT 600 mg TbER  TAKE 1 TABLET BY MOUTH TWICE DAILY 60 Tab 0   ??? rOPINIRole (REQUIP) 1 mg tablet TAKE 4 TABLETS BY MOUTH 1 TO 3 HOURS BEFORE BEDTIME EVERYDAY 120 Tab 2   ??? ibuprofen (MOTRIN) 600 mg tablet Take 1 Tab by mouth every six (6) hours as needed for Pain. 20 Tab 0   ??? buPROPion XL (WELLBUTRIN XL) 150 mg tablet TK 1 T PO  D.  0   ??? pravastatin (PRAVACHOL) 40 mg tablet 1 po every day Please make a med check appointment in the next month. Thank you. 30 Tab 0    ??? aspirin 81 mg chewable tablet Take 1 Tab by mouth daily. 30 Tab 0   ??? ergocalciferol (ERGOCALCIFEROL) 50,000 unit capsule Take 1 Cap by mouth every seven (7) days. 12 Cap 1   ??? escitalopram oxalate (LEXAPRO) 20 mg tablet Take 1 Tab by mouth daily. 30 Tab 5   ??? omeprazole (PRILOSEC) 20 mg capsule Take 20 mg by mouth nightly.       No facility-administered medications prior to visit.        Imaging:  MRI Results (most recent):  Results from Hospital Encounter encounter on 02/17/17   MRI BRAIN W WO CONT    Narrative EXAM:  MRI BRAIN W WO CONT    INDICATION:    Pt with MRI findings concerning for MS, but no clinical history  to suggest MS, now with new bilateral rotary nystagmus    COMPARISON:  MRI brain 03/27/2016.    CONTRAST: 15 ml Dotarem.    TECHNIQUE:    Multiplanar multisequence acquisition without and with contrast of the brain.    FINDINGS:  Generalized parenchymal volume loss with commensurate dilation of the sulci and  ventricular system. Unchanged periventricular and deep white matter T2/FLAIR  hyperintensities, in a pattern most consistent with chronic microvascular  ischemic disease. No involvement of the callosal septal interface. Small chronic  infarct in the right cerebellum. There is no acute infarct, hemorrhage,  extra-axial fluid collection, or mass effect. There is no cerebellar tonsillar  herniation. Expected arterial flow-voids are present. No evidence of abnormal  enhancement.    The paranasal sinuses, mastoid air cells, and middle ears are clear. The orbital  contents are within normal limits. No significant osseous or scalp lesions are  identified.      Impression IMPRESSION:   1. No evidence of acute intracranial abnormality.  2. Unchanged generalized parenchymal volume loss and chronic microvascular  ischemic disease. No evidence of demyelinating disease as clinically queried.      CT Results (most recent):  Results from Hospital Encounter encounter on 10/30/17   CT MAXILLOFACIAL WO CONT     Narrative INDICATION: Fall, abrasion Pain, max-facial     EXAMINATION:  MAXILLOFACIAL CT    COMPARISON:  None    TECHNIQUE:  Axial CT was performed through the maxillofacial bones. Coronal and  sagittal reconstructions were obtained.  CT dose reduction was achieved through  use of a standardized protocol tailored for this examination and automatic  exposure control for dose modulation.    FINDINGS:    The facial bones are intact. The paranasal sinuses are clear. Minimal right  supraorbital and facial soft tissue swelling. The globes and lenses are intact.      Impression IMPRESSION:    No acute facial bone fracture. Minimal soft tissue swelling in the right face  and supraorbital region          Reviewed records in [facility]  and media tab today    Lab Review   Results for orders placed or performed during the hospital encounter of 10/30/17   CULTURE, URINE   Result Value Ref Range    Special Requests: NO SPECIAL REQUESTS  Reflexed from [id number]        Colony Count 80000  COLONIES/mL        Culture result: MIXED UROGENITAL FLORA ISOLATED     DRUG SCREEN, URINE   Result Value Ref Range    AMPHETAMINES NEGATIVE  NEG      BARBITURATES NEGATIVE  NEG      BENZODIAZEPINES POSITIVE (A) NEG      COCAINE NEGATIVE  NEG      METHADONE NEGATIVE  NEG      OPIATES NEGATIVE  NEG      PCP(PHENCYCLIDINE) NEGATIVE  NEG      THC (TH-CANNABINOL) NEGATIVE  NEG      Drug screen comment (NOTE)    URINALYSIS W/ REFLEX CULTURE   Result Value Ref Range    Color YELLOW/STRAW      Appearance CLEAR CLEAR      Specific gravity 1.030 1.003 - 1.030      pH (UA) 6.0 5.0 - 8.0      Protein TRACE (A) NEG mg/dL    Glucose NEGATIVE  NEG mg/dL    Ketone TRACE (A) NEG mg/dL    Blood NEGATIVE  NEG      Urobilinogen 1.0 0.2 - 1.0 EU/dL    Nitrites NEGATIVE  NEG      Leukocyte Esterase MODERATE (A) NEG      WBC 50-100 0 - 4 /hpf    RBC 0-5 0 - 5 /hpf    Epithelial cells FEW FEW /lpf    Bacteria 1+ (A) NEG /hpf    UA:UC IF INDICATED URINE CULTURE ORDERED  (A) CNI      Mucus 2+ (A) NEG /lpf   BILIRUBIN, CONFIRM   Result Value Ref Range    Bilirubin UA, confirm NEGATIVE  NEG          Exam:  Visit Vitals  BP 104/70   Pulse 71   Resp 14   Ht 5\' 3"  (1.6 m)   Wt 78 kg (172 lb)   SpO2 (!) 89%   BMI 30.47 kg/m??     General:  Alert, cooperative, no distress.    Head:  Normocephalic, without obvious abnormality, atraumatic.   Respiratory:  Heart:   Non labored breathing  Regular rate and rhythm, no murmurs   Carotids: 2+, no bruits   Extremities: Warm, no cyanosis or edema.   Pulses: 2+ radial pulses.       Neurologic:  MS: Alert and oriented x 4, speech intact. Language - inact. Attention and fund of knowledge appropriate.  Recent and remote memory intact.  Cranial Nerves:  II: visual fields Full to confrontation   II: pupils    II: optic disc    III,VII: ptosis Mild right ptosis - stable   III,IV,VI: extraocular muscles  EOMI, bilateral end gaze rotary nystagmus, no diplopia   V: facial light touch sensation     VII: facial muscle function   symmetric   VIII: hearing intact   IX: soft palate elevation  Elevated sym   XI: trapezius strength  5/5   XI: sternocleidomastoid strength    XII: tongue  Tongue midline     Motor: normal bulk and tone, no tremor  Strength: 5/5 throughout, no PD  Sensory: Intact to LT/PP throughout  Coordination: FTN and RAM intact, and HTS intact  Gait: normal gait with flip flops on, normal arm swing, able to tandem walk, turns normally.  Reflexes: 2+ sym       Assessment/Plan   Pt is a 62 y.o. right handed female with h/o radiographic changes c/w MS, but no clinical symptoms, MRI brain 02/17/17 was stable.  RLS, with an ill-defined irritating sensation in her legs with urge to move her legs beginning around 6PM and keeping her awake and inhibiting her activities, though question if there may be some component of psych issues contributing to her need to walk. Additionally, c/o memory loss and continues to do so, suspected due to underlying  mood disorder/personality d/o, and stress, and this suspicion was confirmed on neuropsychological testing with Dr. Maren Reamer in April, 2019.  Pt reports ongoing falls, all of which sound situational and not surprising under the circumstances, unrelated to an underlying neurological issue. New c/o monocular diplopia while eye were watering that occurred while she was driving, concerned she might have had a stroke b/c she did not pull over while this was going on.  Exam is stable, with near continuous talking, bilateral rotary nystagmus worse at end gaze to right, right ptosis, steady gait with flip flops on, able to tandem walk, o/w unremarkable.   -Continue Horizant 600mg  bid and Requip 4mg  qhs for RLS,     -Continued psychotherapy for mood disorders/stress.   -No suspicion for TIA/CVA as cause of watering eyes with diplopia and poor judgement.   -Do recommend she restart ASA 81mg  daily and Pravachol. Pointed out that this is on her AVS every time she is seen, so not certain why she states that she didn't know she was supposed to be taking it.   -Falling for unclear reasons, events continue to sound mechanical and related to poor judgement. No obvious reason why she would not be able to get herself up after a fall. No findings on neurological exam to explain difficulties. Wearing flip flops in the office and ambulating without difficulty. Recommend PT, sturdy shoes, and use of cane.   -F/u in clinic in 6 months, instructed to call in the interim if needed.     ICD-10-CM ICD-9-CM    1. Gait disturbance R26.9 781.2 REFERRAL TO PHYSICAL THERAPY   2. Falls frequently R29.6 V15.88 REFERRAL TO PHYSICAL THERAPY   3. RLS (restless legs syndrome) G25.81 333.94    4. Abnormal brain scan R94.02 794.09        Signed:  Jodi Mourning, MD  04/04/2018

## 2018-04-04 NOTE — Progress Notes (Signed)
Restless leg follow up. Really bothering her the last couple Pt states increased falling times.  6 times last week.

## 2018-04-04 NOTE — Progress Notes (Signed)
Restless leg follow up. Really bothering her the last couple Pt states increased falling times.  6 times last week.

## 2018-04-04 NOTE — Progress Notes (Signed)
Neurology Progress Note    HISTORY PROVIDED BY: patient and daughter    Chief Complaint:   Chief Complaint   Patient presents with   ??? Neurologic Problem      Subjective:   Pt is a 62 y.o. right handed female last seen in clinic on 11/29/17 in f/u for h/o radiographic changes c/w MS, but no clinical symptoms, MRI brain 02/17/17 was stable.  RLS, with an ill-defined irritating sensation in her legs with urge to move her legs beginning around 6PM and keeping her awake and inhibiting her activities, though question if there may be some component of psych issues contributing to her need to walk. Additionally, c/o memory loss, suspected due to underlying mood disorder/personality d/o, and stress, confirmed on neuropsychological testing with Dr. Maren Reamer in April, 2019.  Pt reported falls, all of which sound situational and not surprising in circumstances, unrelated to an underlying neurological issue. Exam was stable, with near continuous talking, bilateral rotary nystagmus worse at end gaze to right, right ptosis, o/w unremarkable. Continued Horizant 600mg  bid and Requip 4mg  qhs for RLS,   Continued psychotherapy for mood disorders/stress.     She returns for f/u. She reports additional falls, 3 times in one week two weeks ago, with one was feeding farm cats, got feet tangled up, had trouble getting up had to slide herself up a brick wall. Another time, she was trying to get something out of a cabinet and stepped over something to reach and legs slid apart and she fell.  Reports being unsteady on feet, "weeble wobbles."  Has notices mild tremors in hands.  Mentions her "eyes have been funny", eyes were watering after eating a meal outside.  Drove home and started having double vision the whole way home, talked herself home, which is the part that seems most concerning to her, she isn't sure why she didn't pull over to call her husband to pick her up, though she  did think about doing that. Wants to know if this poor judgement is a sign she was having a stroke. If she covered one eye she still had double vision, while she covered one eye with one hand she hit a mailbox with the side mirror and then proceeded to cover the other eye and she scraped the car on bushes on the other side. Having word finding at times.  She is not taking ASA, states she didn't know she is supposed to take it.  Not taking Pravachol, b/c she just hasn't seen PCP for refill.      Past Medical History:   Diagnosis Date   ??? Abnormal brain scan    ??? Arthritis    ??? Chronic pain    ??? Depression    ??? DVT (deep venous thrombosis) (HCC)    ??? Falls frequently    ??? Fracture of left ankle    ??? Headache(784.0) 2011   ??? Ill-defined condition 09/15/2015    FX R HAND   ??? Other and unspecified hyperlipidemia 12/27/2012   ??? PE (pulmonary embolism) 12/2012   ??? RLS (restless legs syndrome) 03/16/2016    Dr Scarlett Presto   ??? TIA (transient ischemic attack) 03/2016    Right facial droop, numbness in lip   ??? Torn rotator cuff     right      Past Surgical History:   Procedure Laterality Date   ??? BREAST SURGERY PROCEDURE UNLISTED  2002    AUGMENTATION   ??? HX APPENDECTOMY     ???  HX CESAREAN SECTION      x4   ??? HX GI      COLONOSCOPY   ??? 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 HX ORTHOPAEDIC  08/2004    rotater cuff r. shoulder   ??? HX ORTHOPAEDIC      left ankle fracture treated with air cast   ??? HX ORTHOPAEDIC  6/13    left shoulder fxt and dislocation   ??? HX OTHER SURGICAL  2010    left foot torn tendon   ??? HX TUBAL LIGATION     ??? IMPLANT BREAST SILICONE/EQ  2002    Bilateral breast implants      Social History     Socioeconomic History   ??? Marital status: MARRIED     Spouse name: Not on file   ??? Number of children: Not on file   ??? Years of education: Not on file   ??? Highest education level: Not on file   Occupational History   ??? Not on file   Social Needs   ??? Financial resource strain: Not on file   ??? Food insecurity:      Worry: Not on file     Inability: Not on file   ??? Transportation needs:     Medical: Not on file     Non-medical: Not on file   Tobacco Use   ??? Smoking status: Never Smoker   ??? Smokeless tobacco: Never Used   Substance and Sexual Activity   ??? Alcohol use: No     Comment: Pt denies   ??? Drug use: No   ??? Sexual activity: Yes     Partners: Male   Lifestyle   ??? Physical activity:     Days per week: Not on file     Minutes per session: Not on file   ??? Stress: Not on file   Relationships   ??? Social connections:     Talks on phone: Not on file     Gets together: Not on file     Attends religious service: Not on file     Active member of club or organization: Not on file     Attends meetings of clubs or organizations: Not on file     Relationship status: Not on file   ??? Intimate partner violence:     Fear of current or ex partner: Not on file     Emotionally abused: Not on file     Physically abused: Not on file     Forced sexual activity: Not on file   Other Topics Concern   ??? Not on file   Social History Narrative   ??? Not on file     Family History   Problem Relation Age of Onset   ??? Heart Disease Mother    ??? Heart Disease Father    ??? Cancer Father         thyroid cancer   ??? Arthritis-rheumatoid Sister    ??? No Known Problems Son    ??? No Known Problems Daughter    ??? No Known Problems Daughter    ??? No Known Problems Daughter    ??? Anesth Problems Neg Hx           Objective:   ROS:  Per HPI or PMH o/w neg    No Known Allergies    Meds:  Outpatient Medications Prior to Visit   Medication Sig Dispense Refill   ??? HORIZANT 600 mg TbER  TAKE 1 TABLET BY MOUTH TWICE DAILY 60 Tab 0   ??? rOPINIRole (REQUIP) 1 mg tablet TAKE 4 TABLETS BY MOUTH 1 TO 3 HOURS BEFORE BEDTIME EVERYDAY 120 Tab 2   ??? ibuprofen (MOTRIN) 600 mg tablet Take 1 Tab by mouth every six (6) hours as needed for Pain. 20 Tab 0   ??? buPROPion XL (WELLBUTRIN XL) 150 mg tablet TK 1 T PO  D.  0   ??? pravastatin (PRAVACHOL) 40 mg tablet 1 po every day Please make a med  check appointment in the next month. Thank you. 30 Tab 0   ??? aspirin 81 mg chewable tablet Take 1 Tab by mouth daily. 30 Tab 0   ??? ergocalciferol (ERGOCALCIFEROL) 50,000 unit capsule Take 1 Cap by mouth every seven (7) days. 12 Cap 1   ??? escitalopram oxalate (LEXAPRO) 20 mg tablet Take 1 Tab by mouth daily. 30 Tab 5   ??? omeprazole (PRILOSEC) 20 mg capsule Take 20 mg by mouth nightly.       No facility-administered medications prior to visit.        Imaging:  MRI Results (most recent):  Results from Hospital Encounter encounter on 02/17/17   MRI BRAIN W WO CONT    Narrative EXAM:  MRI BRAIN W WO CONT    INDICATION:    Pt with MRI findings concerning for MS, but no clinical history  to suggest MS, now with new bilateral rotary nystagmus    COMPARISON:  MRI brain 03/27/2016.    CONTRAST: 15 ml Dotarem.    TECHNIQUE:    Multiplanar multisequence acquisition without and with contrast of the brain.    FINDINGS:  Generalized parenchymal volume loss with commensurate dilation of the sulci and  ventricular system. Unchanged periventricular and deep white matter T2/FLAIR  hyperintensities, in a pattern most consistent with chronic microvascular  ischemic disease. No involvement of the callosal septal interface. Small chronic  infarct in the right cerebellum. There is no acute infarct, hemorrhage,  extra-axial fluid collection, or mass effect. There is no cerebellar tonsillar  herniation. Expected arterial flow-voids are present. No evidence of abnormal  enhancement.    The paranasal sinuses, mastoid air cells, and middle ears are clear. The orbital  contents are within normal limits. No significant osseous or scalp lesions are  identified.      Impression IMPRESSION:   1. No evidence of acute intracranial abnormality.  2. Unchanged generalized parenchymal volume loss and chronic microvascular  ischemic disease. No evidence of demyelinating disease as clinically queried.      CT Results (most recent):   Results from Hospital Encounter encounter on 10/30/17   CT MAXILLOFACIAL WO CONT    Narrative INDICATION: Fall, abrasion Pain, max-facial     EXAMINATION:  MAXILLOFACIAL CT    COMPARISON:  None    TECHNIQUE:  Axial CT was performed through the maxillofacial bones. Coronal and  sagittal reconstructions were obtained.  CT dose reduction was achieved through  use of a standardized protocol tailored for this examination and automatic  exposure control for dose modulation.    FINDINGS:    The facial bones are intact. The paranasal sinuses are clear. Minimal right  supraorbital and facial soft tissue swelling. The globes and lenses are intact.      Impression IMPRESSION:    No acute facial bone fracture. Minimal soft tissue swelling in the right face  and supraorbital region          Reviewed records in [facility]  and media tab today    Lab Review   Results for orders placed or performed during the hospital encounter of 10/30/17   CULTURE, URINE   Result Value Ref Range    Special Requests: NO SPECIAL REQUESTS  Reflexed from [id number]        Colony Count 80000  COLONIES/mL        Culture result: MIXED UROGENITAL FLORA ISOLATED     DRUG SCREEN, URINE   Result Value Ref Range    AMPHETAMINES NEGATIVE  NEG      BARBITURATES NEGATIVE  NEG      BENZODIAZEPINES POSITIVE (A) NEG      COCAINE NEGATIVE  NEG      METHADONE NEGATIVE  NEG      OPIATES NEGATIVE  NEG      PCP(PHENCYCLIDINE) NEGATIVE  NEG      THC (TH-CANNABINOL) NEGATIVE  NEG      Drug screen comment (NOTE)    URINALYSIS W/ REFLEX CULTURE   Result Value Ref Range    Color YELLOW/STRAW      Appearance CLEAR CLEAR      Specific gravity 1.030 1.003 - 1.030      pH (UA) 6.0 5.0 - 8.0      Protein TRACE (A) NEG mg/dL    Glucose NEGATIVE  NEG mg/dL    Ketone TRACE (A) NEG mg/dL    Blood NEGATIVE  NEG      Urobilinogen 1.0 0.2 - 1.0 EU/dL    Nitrites NEGATIVE  NEG      Leukocyte Esterase MODERATE (A) NEG      WBC 50-100 0 - 4 /hpf    RBC 0-5 0 - 5 /hpf     Epithelial cells FEW FEW /lpf    Bacteria 1+ (A) NEG /hpf    UA:UC IF INDICATED URINE CULTURE ORDERED (A) CNI      Mucus 2+ (A) NEG /lpf   BILIRUBIN, CONFIRM   Result Value Ref Range    Bilirubin UA, confirm NEGATIVE  NEG          Exam:  Visit Vitals  BP 104/70   Pulse 71   Resp 14   Ht 5\' 3"  (1.6 m)   Wt 78 kg (172 lb)   SpO2 (!) 89%   BMI 30.47 kg/m??     General:  Alert, cooperative, no distress.    Head:  Normocephalic, without obvious abnormality, atraumatic.   Respiratory:  Heart:   Non labored breathing  Regular rate and rhythm, no murmurs   Carotids: 2+, no bruits   Extremities: Warm, no cyanosis or edema.   Pulses: 2+ radial pulses.       Neurologic:  MS: Alert and oriented x 4, speech intact. Language - inact. Attention and fund of knowledge appropriate.  Recent and remote memory intact.  Cranial Nerves:  II: visual fields Full to confrontation   II: pupils    II: optic disc    III,VII: ptosis Mild right ptosis - stable   III,IV,VI: extraocular muscles  EOMI, bilateral end gaze rotary nystagmus, no diplopia   V: facial light touch sensation     VII: facial muscle function   symmetric   VIII: hearing intact   IX: soft palate elevation  Elevated sym   XI: trapezius strength  5/5   XI: sternocleidomastoid strength    XII: tongue  Tongue midline     Motor: normal bulk and tone, no tremor  Strength: 5/5 throughout, no PD  Sensory: Intact to LT/PP throughout  Coordination: FTN and RAM intact, and HTS intact  Gait: normal gait with flip flops on, normal arm swing, able to tandem walk, turns normally.  Reflexes: 2+ sym       Assessment/Plan   Pt is a 62 y.o. right handed female with h/o radiographic changes c/w MS, but no clinical symptoms, MRI brain 02/17/17 was stable.  RLS, with an ill-defined irritating sensation in her legs with urge to move her legs beginning around 6PM and keeping her awake and inhibiting her activities, though question if there may be some component of psych issues  contributing to her need to walk. Additionally, c/o memory loss and continues to do so, suspected due to underlying mood disorder/personality d/o, and stress, and this suspicion was confirmed on neuropsychological testing with Dr. Maren Reamer in April, 2019.  Pt reports ongoing falls, all of which sound situational and not surprising under the circumstances, unrelated to an underlying neurological issue. New c/o monocular diplopia while eye were watering that occurred while she was driving, concerned she might have had a stroke b/c she did not pull over while this was going on.  Exam is stable, with near continuous talking, bilateral rotary nystagmus worse at end gaze to right, right ptosis, steady gait with flip flops on, able to tandem walk, o/w unremarkable.   -Continue Horizant 600mg  bid and Requip 4mg  qhs for RLS,     -Continued psychotherapy for mood disorders/stress.   -No suspicion for TIA/CVA as cause of watering eyes with diplopia and poor judgement.   -Do recommend she restart ASA 81mg  daily and Pravachol. Pointed out that this is on her AVS every time she is seen, so not certain why she states that she didn't know she was supposed to be taking it.   -Falling for unclear reasons, events continue to sound mechanical and related to poor judgement. No obvious reason why she would not be able to get herself up after a fall. No findings on neurological exam to explain difficulties. Wearing flip flops in the office and ambulating without difficulty. Recommend PT, sturdy shoes, and use of cane.   -F/u in clinic in 6 months, instructed to call in the interim if needed.     ICD-10-CM ICD-9-CM    1. Gait disturbance R26.9 781.2 REFERRAL TO PHYSICAL THERAPY   2. Falls frequently R29.6 V15.88 REFERRAL TO PHYSICAL THERAPY   3. RLS (restless legs syndrome) G25.81 333.94    4. Abnormal brain scan R94.02 794.09        Signed:  Jodi Mourning, MD  04/04/2018

## 2018-04-20 NOTE — Telephone Encounter (Signed)
-----   Message from Lorre Nick sent at 04/20/2018 12:35 PM EDT -----  Regarding: Dr.Ransom/Refill  Medication Refill    Caller (if not patient):      Relationship of caller (if not patient):      Best contact number(s): [phone]      Name of medication and dosage if known:horizant 600 mg      Is patient out of this medication (yes/no): yes      [facility] name[facility]    Pharmacy listed in chart? (yes/no):yes  Pharmacy phone number:      Details to clarify the request: Pt called requesting a refill pt stated pharmacy is requesting a refill stating they never receive a refill request      Lorre Nick

## 2018-04-24 ENCOUNTER — Telehealth

## 2018-04-24 MED ORDER — GABAPENTIN ENACARBIL ER 600 MG TABLET,EXTENDED RELEASE
600 mg | ORAL_TABLET | ORAL | 5 refills | Status: DC
Start: 2018-04-24 — End: 2018-10-11

## 2018-04-24 MED ORDER — HORIZANT ER 600 MG TABLET,EXTENDED RELEASE
600 mg | ORAL_TABLET | ORAL | 5 refills | Status: DC
Start: 2018-04-24 — End: 2018-04-24

## 2018-04-24 NOTE — Telephone Encounter (Signed)
Reprinting Rx to be faxed to pharmacy.

## 2018-05-01 MED ORDER — ROPINIROLE 1 MG TAB
1 mg | ORAL_TABLET | ORAL | 5 refills | Status: DC
Start: 2018-05-01 — End: 2018-10-11

## 2018-09-01 ENCOUNTER — Encounter: Attending: Internal Medicine | Primary: Internal Medicine

## 2018-09-19 ENCOUNTER — Encounter: Attending: Internal Medicine | Primary: Internal Medicine

## 2018-10-03 ENCOUNTER — Encounter: Attending: Internal Medicine | Primary: Internal Medicine

## 2018-10-09 NOTE — Telephone Encounter (Signed)
VM left to change visit to VV

## 2018-10-11 ENCOUNTER — Telehealth: Admit: 2018-10-11 | Payer: PRIVATE HEALTH INSURANCE | Attending: Neurology | Primary: Internal Medicine

## 2018-10-11 ENCOUNTER — Encounter: Attending: Neurology | Primary: Internal Medicine

## 2018-10-11 ENCOUNTER — Telehealth: Attending: Neurology | Primary: Internal Medicine

## 2018-10-11 DIAGNOSIS — R269 Unspecified abnormalities of gait and mobility: Secondary | ICD-10-CM

## 2018-10-11 MED ORDER — HORIZANT ER 600 MG TABLET,EXTENDED RELEASE
600 mg | ORAL_TABLET | ORAL | 1 refills | Status: DC
Start: 2018-10-11 — End: 2019-04-18

## 2018-10-11 MED ORDER — HORIZANT ER 600 MG TABLET,EXTENDED RELEASE
600 mg | ORAL_TABLET | ORAL | 1 refills | Status: DC
Start: 2018-10-11 — End: 2018-10-11

## 2018-10-11 MED ORDER — ROPINIROLE 1 MG TAB
1 mg | ORAL_TABLET | ORAL | 1 refills | Status: DC
Start: 2018-10-11 — End: 2018-10-11

## 2018-10-11 MED ORDER — ROPINIROLE 1 MG TAB
1 mg | ORAL_TABLET | ORAL | 1 refills | Status: DC
Start: 2018-10-11 — End: 2019-04-28

## 2018-10-11 NOTE — Progress Notes (Signed)
Neurology Progress Note    HISTORY PROVIDED BY: patient and Lindsay [name] is a 63 y.o. female who was seen by synchronous (real-time) audio-video technology on 10/11/2018.  Two factor identification completed.     Consent:  She and/or her healthcare decision maker is aware that this patient-initiated Telehealth encounter is a billable service, with coverage as determined by her insurance carrier. She is aware that she may receive a bill and has provided verbal consent to proceed: Yes    I was in the office while conducting this encounter.    I discussed with the patient the nature of our telemedicine visit: Our sessions are not being recorded and personal health information is protected. We will provide follow up care in person when the patient needs it.     Pursuant to the emergency declaration under the [facility] Act and the [employer], 1135 waiver authority and the [employer] Act, this Virtual  Visit was conducted, with patient's consent, to reduce the patient's risk of exposure to COVID-19 and provide continuity of care for an established patient.    Chief Complaint:   Chief Complaint   Patient presents with   ??? Follow-up     please text [phone]. F/U MS, restless leg syndrome, Pt last fell Jan 25th without injury. Pt reports letting her health go by not going to [facility] because of deadline at work that were required to meet. Patient notes a HA about 2-3 times a week that has recently started over the last few weeks, pt believes it may be sinus but is not sure. She notes that you wanted her to walk with a cane but she notes not using, but a little wobbly when initially standing.    ??? Multiple Sclerosis     She notes otherwise okay.       Subjective:   Pt is a 63 y.o. right handed female last seen in clinic on 04/04/18 in f/u for h/o radiographic changes c/w MS, but no clinical symptoms, MRI brain 02/17/17 was stable  since at least 2011.  RLS, with an ill-defined irritating sensation in her legs with urge to move her legs beginning around 6PM and keeping her awake and inhibiting her activities, though question if there may be some component of psych issues contributing to her need to walk. Additionally, c/o memory loss and continues to do so, suspected due to underlying mood disorder/personality d/o, and stress, and this suspicion was confirmed on neuropsychological testing with Dr. Maren Reamer in April, 2019.  Pt reports ongoing falls, all of which sound situational and not surprising under the circumstances, unrelated to an underlying neurological issue. New c/o monocular diplopia while eyes were watering that occurred while she was driving, concerned she might have had a stroke b/c she did not pull over while this was going on.  Exam was stable, with near continuous talking, bilateral rotary nystagmus worse at end gaze to right, right ptosis, steady gait with flip flops on, able to tandem walk, o/w unremarkable.   -Continued Horizant  bid and Requip  qhs for RLS,     -Continued psychotherapy for mood disorders/stress.   -No suspicion for TIA/CVA as cause of watering eyes with diplopia and poor judgement.   -Recommended she restart ASA  daily and Pravachol. Pointed out that this is on her AVS every time she is seen, so not certain why she states that she didn't know she was supposed to be  taking it.   -Falling for unclear reasons, events continue to sound mechanical and related to poor judgement. No obvious reason why she would not be able to get herself up after a fall. No findings on neurological exam to explain difficulties. Wearing flip flops in the office and ambulating without difficulty. Recommended PT, sturdy shoes, and use of cane.     This is a virtual f/u visit.  Pt reports that her RLS are about the same. She is taking Horizant around 1PM and Requip 1mg  at 1PM and Horizant 600mg  at dinner time with 3mg   Requip at same time. She is taking ASA 81mg  and Pravachol again.  She fell over in Jan, was bending over to pick up something and went forward. No injuries. Still feels off balance when she gets going, not using recommended cane.  Not attending PT due to work obligations.  She is having an increase in HAs in last few weeks, questions if this could sinus related. She is experiencing anxiety related to COVID-19. She is taking ibuprofen for HAs. No new complaints.     Past Medical History:   Diagnosis Date   ??? Abnormal brain scan    ??? Anxiety disorder    ??? Arthritis    ??? Chronic pain    ??? Depression    ??? DVT (deep venous thrombosis) (HCC)    ??? Falls frequently    ??? Fracture of left ankle    ??? Headache(784.0) 2011   ??? Ill-defined condition 09/15/2015    FX R HAND   ??? 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 Other and unspecified hyperlipidemia 12/27/2012   ??? PE (pulmonary embolism) 12/2012   ??? RLS (restless legs syndrome) 03/16/2016    Dr Scarlett Presto   ??? TIA (transient ischemic attack) 03/2016    Right facial droop, numbness in lip   ??? Torn rotator cuff     right      Past Surgical History:   Procedure Laterality Date   ??? BREAST SURGERY PROCEDURE UNLISTED  2002    AUGMENTATION   ??? HX APPENDECTOMY     ??? HX CESAREAN SECTION      x4   ??? HX GI      COLONOSCOPY   ??? 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 HX ORTHOPAEDIC  08/2004    rotater cuff r. shoulder   ??? HX ORTHOPAEDIC      left ankle fracture treated with air cast   ??? HX ORTHOPAEDIC  6/13    left shoulder fxt and dislocation   ??? HX OTHER SURGICAL  2010    left foot torn tendon   ??? HX TUBAL LIGATION     ??? IMPLANT BREAST SILICONE/EQ  2002    Bilateral breast implants      Social History     Socioeconomic History   ??? Marital status: MARRIED     Spouse name: Not on file   ??? Number of children: Not on file   ??? Years of education: Not on file   ??? Highest education level: Not on file   Occupational History   ??? Not on file   Social Needs   ??? Financial resource strain: Not on file   ???  Food insecurity     Worry: Not on file     Inability: Not on file   ??? Transportation needs     Medical: Not on file     Non-medical: Not on file   Tobacco Use   ???  Smoking status: Never Smoker   ??? Smokeless tobacco: Never Used   Substance and Sexual Activity   ??? Alcohol use: No     Comment: Pt denies   ??? Drug use: No   ??? Sexual activity: Yes     Partners: Male   Lifestyle   ??? Physical activity     Days per week: Not on file     Minutes per session: Not on file   ??? Stress: Not on file   Relationships   ??? Social [employer]     Talks on phone: Not on file     Gets together: Not on file     Attends religious service: Not on file     Active member of club or organization: Not on file     Attends meetings of clubs or organizations: Not on file     Relationship status: Not on file   ??? Intimate partner violence     Fear of current or ex partner: Not on file     Emotionally abused: Not on file     Physically abused: Not on file     Forced sexual activity: Not on file   Other Topics Concern   ??? Not on file   Social History Narrative   ??? Not on file     Family History   Problem Relation Age of Onset   ??? Heart Disease Mother    ??? Heart Disease Father    ??? Cancer Father         thyroid cancer   ??? Arthritis-rheumatoid Sister    ??? No Known Problems Son    ??? No Known Problems Daughter    ??? No Known Problems Daughter    ??? No Known Problems Daughter    ??? Anesth Problems Neg Hx           Objective:   ROS:  Per HPI or PMH o/w reviewed and neg    No Known Allergies    Meds:  Outpatient Medications Prior to Visit   Medication Sig Dispense Refill   ??? rOPINIRole (REQUIP) 1 mg tablet TAKE 4 TABLETS BY MOUTH 1 TO 3 HOURS BEFORE BEDTIME EVERYDAY 120 Tab 5   ??? gabapentin enacarbil (HORIZANT) 600 mg TbER TAKE 1 TABLET BY MOUTH TWICE DAILY 60 Tab 5   ??? ibuprofen (MOTRIN) 600 mg tablet Take 1 Tab by mouth every six (6) hours as needed for Pain. 20 Tab 0   ??? buPROPion XL (WELLBUTRIN XL) 150 mg tablet 150 mg two (2) times a day. Take 1 tab BID  0    ??? pravastatin (PRAVACHOL) 40 mg tablet 1 po every day Please make a med check appointment in the next month. Thank you. 30 Tab 0   ??? aspirin 81 mg chewable tablet Take 1 Tab by mouth daily. 30 Tab 0   ??? ergocalciferol (ERGOCALCIFEROL) 50,000 unit capsule Take 1 Cap by mouth every seven (7) days. 12 Cap 1   ??? escitalopram oxalate (LEXAPRO) 20 mg tablet Take 1 Tab by mouth daily. 30 Tab 5   ??? omeprazole (PRILOSEC) 20 mg capsule Take 20 mg by mouth nightly.       No facility-administered medications prior to visit.        Imaging:  MRI Results (most recent):  Results from Hospital Encounter encounter on 02/17/17   MRI BRAIN W WO CONT    Narrative EXAM:  MRI BRAIN W WO CONT    INDICATION:  Pt with MRI findings concerning for MS, but no clinical history  to suggest MS, now with new bilateral rotary nystagmus    COMPARISON:  MRI brain 03/27/2016.    CONTRAST: 15 ml Dotarem.    TECHNIQUE:    Multiplanar multisequence acquisition without and with contrast of the brain.    FINDINGS:  Generalized parenchymal volume loss with commensurate dilation of the sulci and  ventricular system. Unchanged periventricular and deep white matter T2/FLAIR  hyperintensities, in a pattern most consistent with chronic microvascular  ischemic disease. No involvement of the callosal septal interface. Small chronic  infarct in the right cerebellum. There is no acute infarct, hemorrhage,  extra-axial fluid collection, or mass effect. There is no cerebellar tonsillar  herniation. Expected arterial flow-voids are present. No evidence of abnormal  enhancement.    The paranasal sinuses, mastoid air cells, and middle ears are clear. The orbital  contents are within normal limits. No significant osseous or scalp lesions are  identified.      Impression IMPRESSION:   1. No evidence of acute intracranial abnormality.  2. Unchanged generalized parenchymal volume loss and chronic microvascular  ischemic disease. No evidence of demyelinating disease as  clinically queried.      CT Results (most recent):  Results from Hospital Encounter encounter on 10/30/17   CT MAXILLOFACIAL WO CONT    Narrative INDICATION: Fall, abrasion Pain, max-facial     EXAMINATION:  MAXILLOFACIAL CT    COMPARISON:  None    TECHNIQUE:  Axial CT was performed through the maxillofacial bones. Coronal and  sagittal reconstructions were obtained.  CT dose reduction was achieved through  use of a standardized protocol tailored for this examination and automatic  exposure control for dose modulation.    FINDINGS:    The facial bones are intact. The paranasal sinuses are clear. Minimal right  supraorbital and facial soft tissue swelling. The globes and lenses are intact.      Impression IMPRESSION:    No acute facial bone fracture. Minimal soft tissue swelling in the right face  and supraorbital region          Reviewed records in [facility] and media tab today    Lab Review   Results for orders placed or performed during the hospital encounter of 10/30/17   CULTURE, URINE   Result Value Ref Range    Special Requests: NO SPECIAL REQUESTS  Reflexed from [id number]        Colony Count 80000  COLONIES/mL        Culture result: MIXED UROGENITAL FLORA ISOLATED     DRUG SCREEN, URINE   Result Value Ref Range    AMPHETAMINES NEGATIVE  NEG      BARBITURATES NEGATIVE  NEG      BENZODIAZEPINES POSITIVE (A) NEG      COCAINE NEGATIVE  NEG      METHADONE NEGATIVE  NEG      OPIATES NEGATIVE  NEG      PCP(PHENCYCLIDINE) NEGATIVE  NEG      THC (TH-CANNABINOL) NEGATIVE  NEG      Drug screen comment (NOTE)    URINALYSIS W/ REFLEX CULTURE   Result Value Ref Range    Color YELLOW/STRAW      Appearance CLEAR CLEAR      Specific gravity 1.030 1.003 - 1.030      pH (UA) 6.0 5.0 - 8.0      Protein TRACE (A) NEG mg/dL    Glucose NEGATIVE  NEG mg/dL  Ketone TRACE (A) NEG mg/dL    Blood NEGATIVE  NEG      Urobilinogen 1.0 0.2 - 1.0 EU/dL    Nitrites NEGATIVE  NEG      Leukocyte Esterase MODERATE (A) NEG      WBC 50-100 0 -  4 /hpf    RBC 0-5 0 - 5 /hpf    Epithelial cells FEW FEW /lpf    Bacteria 1+ (A) NEG /hpf    UA:UC IF INDICATED URINE CULTURE ORDERED (A) CNI      Mucus 2+ (A) NEG /lpf   BILIRUBIN, CONFIRM   Result Value Ref Range    Bilirubin UA, confirm NEGATIVE  NEG        Limited due to virtual visit.  Exam:  Visit Vitals  Ht  (1.6 m)   Wt 68 kg (150 lb)   BMI 26.57 kg/m??     General:  Alert, cooperative, no distress.    Head:  Normocephalic, without obvious abnormality, atraumatic.   Respiratory:  Heart:   Non labored breathing     Carotids:    Extremities:    Pulses:        Neurologic:  MS: Alert and oriented x 4, speech intact. Language - inact. Attention and fund of knowledge appropriate.  Recent and remote memory intact.  Cranial Nerves:  II: visual fields    II: pupils    II: optic disc    III,VII: ptosis Mild right ptosis - stable   III,IV,VI: extraocular muscles  EOMI, no nystagmus seen, no diplopia   V: facial light touch sensation     VII: facial muscle function   symmetric   VIII: hearing intact   IX: soft palate elevation  Elevated sym   XI: trapezius strength     XI: sternocleidomastoid strength    XII: tongue  Tongue midline     Motor: normal bulk and tone, no tremor, no PD  Sensory: (At previous visits: Intact to LT/PP throughout)  Coordination: FTN and RAM intact  Gait: normal gait   Reflexes: Unable to assess       Assessment/Plan   Pt is a 63 y.o. right handed female with h/o radiographic changes c/w MS, but no clinical symptoms, stable MRI's since 2011, the last MRI brain 02/17/17. Additionally, has RLS, with an ill-defined irritating sensation in her legs with urge to move her legs beginning around 6PM and keeping her awake and inhibiting her activities, though question if there may be some component of psych issues contributing to her need to walk. Fairly well controlled on Horizant  bid and Requip  daily. In past, c/o memory loss, not today, suspected due to underlying mood disorder/personality  d/o, and stress, and this suspicion was confirmed on neuropsychological testing with Dr. Maren Reamer in April, 2019.  Pt reports ongoing falls, all of which sound situational and not surprising under the circumstances, unrelated to an underlying neurological issue.  Has PT ordered and has been told to use a cane, but has not done these things.  HAs increased in frequency recently.  Exam is stable, buy limited due to VV, no nystagmus seen, right ptosis, steady gait, o/w unremarkable. Suspect HAs related to tension and anxiety regarding current events, pt in agreement. Will watch and wait to see if HAs improve over time.   -Continue Horizant  bid and Requip  qhs for RLS,     -Continue psychotherapy for mood disorders/stress.   -Continue ASA  daily and Pravachol.   -Falling  for unclear reasons, events continue to sound mechanical and/or related to poor judgement.  -F/u in clinic in 6 months, instructed to call in the interim if needed.     ICD-10-CM ICD-9-CM    1. Gait disturbance R26.9 781.2    2. RLS (restless legs syndrome) G25.81 333.94 gabapentin enacarbil (Horizant) 600 mg TbER      DISCONTINUED: gabapentin enacarbil (Horizant) 600 mg TbER   3. Falls frequently R29.6 V15.88    4. Abnormal brain scan R94.02 794.09        Signed:  Jodi Mourning, MD  10/11/2018

## 2018-10-11 NOTE — Progress Notes (Signed)
 Chief Complaint   Patient presents with   . Follow-up     please text [phone]. F/U MS, restless leg syndrome, Pt last fell Jan 25th without injury. Pt reports letting her health go by not going to [facility] because of deadline at work that were required to meet. Patient notes a HA about 2-3 times a week that has recently started over the last few weeks, pt believes it may be sinus but is not sure. She notes that you wanted her to walk with a cane but she notes not using, but a little wobbly when initially standing.    . Multiple Sclerosis     She notes otherwise okay.      Visit Vitals  Ht 5' 3 (1.6 m)   Wt 68 kg (150 lb)   BMI 26.57 kg/m

## 2018-10-11 NOTE — Progress Notes (Signed)
Chief Complaint   Patient presents with   ??? Follow-up     please text [phone]. F/U MS, restless leg syndrome, Pt last fell Jan 25th without injury. Pt reports letting her health go by not going to [facility] because of deadline at work that were required to meet. Patient notes a HA about 2-3 times a week that has recently started over the last few weeks, pt believes it may be sinus but is not sure. She notes that you wanted her to walk with a cane but she notes not using, but a little wobbly when initially standing.    ??? Multiple Sclerosis     She notes otherwise okay.      Visit Vitals  Ht 5' 3" (1.6 m)   Wt 68 kg (150 lb)   BMI 26.57 kg/m??

## 2018-10-11 NOTE — Progress Notes (Signed)
Neurology Progress Note    HISTORY PROVIDED BY: patient and Lindsay [name] is a 63 y.o. female who was seen by synchronous (real-time) audio-video technology on 10/11/2018.  Two factor identification completed.     Consent:  She and/or her healthcare decision maker is aware that this patient-initiated Telehealth encounter is a billable service, with coverage as determined by her insurance carrier. She is aware that she may receive a bill and has provided verbal consent to proceed: Yes    I was in the office while conducting this encounter.    I discussed with the patient the nature of our telemedicine visit: Our sessions are not being recorded and personal health information is protected. We will provide follow up care in person when the patient needs it.     Pursuant to the emergency declaration under the [facility] Act and the [employer], 1135 waiver authority and the [employer] Act, this Virtual  Visit was conducted, with patient's consent, to reduce the patient's risk of exposure to COVID-19 and provide continuity of care for an established patient.    Chief Complaint:   Chief Complaint   Patient presents with   ??? Follow-up     please text [phone]. F/U MS, restless leg syndrome, Pt last fell Jan 25th without injury. Pt reports letting her health go by not going to [facility] because of deadline at work that were required to meet. Patient notes a HA about 2-3 times a week that has recently started over the last few weeks, pt believes it may be sinus but is not sure. She notes that you wanted her to walk with a cane but she notes not using, but a little wobbly when initially standing.    ??? Multiple Sclerosis     She notes otherwise okay.       Subjective:   Pt is a 63 y.o. right handed female last seen in clinic on 04/04/18 in f/u for h/o radiographic changes c/w MS, but no clinical symptoms, MRI brain  02/17/17 was stable since at least 2011.  RLS, with an ill-defined irritating sensation in her legs with urge to move her legs beginning around 6PM and keeping her awake and inhibiting her activities, though question if there may be some component of psych issues contributing to her need to walk. Additionally, c/o memory loss and continues to do so, suspected due to underlying mood disorder/personality d/o, and stress, and this suspicion was confirmed on neuropsychological testing with Dr. Maren Reamer in April, 2019.  Pt reports ongoing falls, all of which sound situational and not surprising under the circumstances, unrelated to an underlying neurological issue. New c/o monocular diplopia while eyes were watering that occurred while she was driving, concerned she might have had a stroke b/c she did not pull over while this was going on.  Exam was stable, with near continuous talking, bilateral rotary nystagmus worse at end gaze to right, right ptosis, steady gait with flip flops on, able to tandem walk, o/w unremarkable.   -Continued Horizant  bid and Requip  qhs for RLS,     -Continued psychotherapy for mood disorders/stress.   -No suspicion for TIA/CVA as cause of watering eyes with diplopia and poor judgement.   -Recommended she restart ASA  daily and Pravachol. Pointed out that this is on her AVS every time she is seen, so not certain why she states that she didn't know she was supposed to be  taking it.   -Falling for unclear reasons, events continue to sound mechanical and related to poor judgement. No obvious reason why she would not be able to get herself up after a fall. No findings on neurological exam to explain difficulties. Wearing flip flops in the office and ambulating without difficulty. Recommended PT, sturdy shoes, and use of cane.     This is a virtual f/u visit.  Pt reports that her RLS are about the same. She is taking Horizant around 1PM and Requip 1mg  at 1PM and Horizant 600mg   at dinner time with 3mg  Requip at same time. She is taking ASA 81mg  and Pravachol again.  She fell over in Jan, was bending over to pick up something and went forward. No injuries. Still feels off balance when she gets going, not using recommended cane.  Not attending PT due to work obligations.  She is having an increase in HAs in last few weeks, questions if this could sinus related. She is experiencing anxiety related to COVID-19. She is taking ibuprofen for HAs. No new complaints.     Past Medical History:   Diagnosis Date   ??? Abnormal brain scan    ??? Anxiety disorder    ??? Arthritis    ??? Chronic pain    ??? Depression    ??? DVT (deep venous thrombosis) (HCC)    ??? Falls frequently    ??? Fracture of left ankle    ??? Headache(784.0) 2011   ??? Ill-defined condition 09/15/2015    FX R HAND   ??? 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 Other and unspecified hyperlipidemia 12/27/2012   ??? PE (pulmonary embolism) 12/2012   ??? RLS (restless legs syndrome) 03/16/2016    Dr Scarlett Presto   ??? TIA (transient ischemic attack) 03/2016    Right facial droop, numbness in lip   ??? Torn rotator cuff     right      Past Surgical History:   Procedure Laterality Date   ??? BREAST SURGERY PROCEDURE UNLISTED  2002    AUGMENTATION   ??? HX APPENDECTOMY     ??? HX CESAREAN SECTION      x4   ??? HX GI      COLONOSCOPY   ??? 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 HX ORTHOPAEDIC  08/2004    rotater cuff r. shoulder   ??? HX ORTHOPAEDIC      left ankle fracture treated with air cast   ??? HX ORTHOPAEDIC  6/13    left shoulder fxt and dislocation   ??? HX OTHER SURGICAL  2010    left foot torn tendon   ??? HX TUBAL LIGATION     ??? IMPLANT BREAST SILICONE/EQ  2002    Bilateral breast implants      Social History     Socioeconomic History   ??? Marital status: MARRIED     Spouse name: Not on file   ??? Number of children: Not on file   ??? Years of education: Not on file   ??? Highest education level: Not on file   Occupational History   ??? Not on file   Social Needs    ??? Financial resource strain: Not on file   ??? Food insecurity     Worry: Not on file     Inability: Not on file   ??? Transportation needs     Medical: Not on file     Non-medical: Not on file   Tobacco Use   ???  Smoking status: Never Smoker   ??? Smokeless tobacco: Never Used   Substance and Sexual Activity   ??? Alcohol use: No     Comment: Pt denies   ??? Drug use: No   ??? Sexual activity: Yes     Partners: Male   Lifestyle   ??? Physical activity     Days per week: Not on file     Minutes per session: Not on file   ??? Stress: Not on file   Relationships   ??? Social [employer]     Talks on phone: Not on file     Gets together: Not on file     Attends religious service: Not on file     Active member of club or organization: Not on file     Attends meetings of clubs or organizations: Not on file     Relationship status: Not on file   ??? Intimate partner violence     Fear of current or ex partner: Not on file     Emotionally abused: Not on file     Physically abused: Not on file     Forced sexual activity: Not on file   Other Topics Concern   ??? Not on file   Social History Narrative   ??? Not on file     Family History   Problem Relation Age of Onset   ??? Heart Disease Mother    ??? Heart Disease Father    ??? Cancer Father         thyroid cancer   ??? Arthritis-rheumatoid Sister    ??? No Known Problems Son    ??? No Known Problems Daughter    ??? No Known Problems Daughter    ??? No Known Problems Daughter    ??? Anesth Problems Neg Hx           Objective:   ROS:  Per HPI or PMH o/w reviewed and neg    No Known Allergies    Meds:  Outpatient Medications Prior to Visit   Medication Sig Dispense Refill   ??? rOPINIRole (REQUIP) 1 mg tablet TAKE 4 TABLETS BY MOUTH 1 TO 3 HOURS BEFORE BEDTIME EVERYDAY 120 Tab 5   ??? gabapentin enacarbil (HORIZANT) 600 mg TbER TAKE 1 TABLET BY MOUTH TWICE DAILY 60 Tab 5   ??? ibuprofen (MOTRIN) 600 mg tablet Take 1 Tab by mouth every six (6) hours as needed for Pain. 20 Tab 0    ??? buPROPion XL (WELLBUTRIN XL) 150 mg tablet 150 mg two (2) times a day. Take 1 tab BID  0   ??? pravastatin (PRAVACHOL) 40 mg tablet 1 po every day Please make a med check appointment in the next month. Thank you. 30 Tab 0   ??? aspirin 81 mg chewable tablet Take 1 Tab by mouth daily. 30 Tab 0   ??? ergocalciferol (ERGOCALCIFEROL) 50,000 unit capsule Take 1 Cap by mouth every seven (7) days. 12 Cap 1   ??? escitalopram oxalate (LEXAPRO) 20 mg tablet Take 1 Tab by mouth daily. 30 Tab 5   ??? omeprazole (PRILOSEC) 20 mg capsule Take 20 mg by mouth nightly.       No facility-administered medications prior to visit.        Imaging:  MRI Results (most recent):  Results from Hospital Encounter encounter on 02/17/17   MRI BRAIN W WO CONT    Narrative EXAM:  MRI BRAIN W WO CONT    INDICATION:  Pt with MRI findings concerning for MS, but no clinical history  to suggest MS, now with new bilateral rotary nystagmus    COMPARISON:  MRI brain 03/27/2016.    CONTRAST: 15 ml Dotarem.    TECHNIQUE:    Multiplanar multisequence acquisition without and with contrast of the brain.    FINDINGS:  Generalized parenchymal volume loss with commensurate dilation of the sulci and  ventricular system. Unchanged periventricular and deep white matter T2/FLAIR  hyperintensities, in a pattern most consistent with chronic microvascular  ischemic disease. No involvement of the callosal septal interface. Small chronic  infarct in the right cerebellum. There is no acute infarct, hemorrhage,  extra-axial fluid collection, or mass effect. There is no cerebellar tonsillar  herniation. Expected arterial flow-voids are present. No evidence of abnormal  enhancement.    The paranasal sinuses, mastoid air cells, and middle ears are clear. The orbital  contents are within normal limits. No significant osseous or scalp lesions are  identified.      Impression IMPRESSION:   1. No evidence of acute intracranial abnormality.   2. Unchanged generalized parenchymal volume loss and chronic microvascular  ischemic disease. No evidence of demyelinating disease as clinically queried.      CT Results (most recent):  Results from Hospital Encounter encounter on 10/30/17   CT MAXILLOFACIAL WO CONT    Narrative INDICATION: Fall, abrasion Pain, max-facial     EXAMINATION:  MAXILLOFACIAL CT    COMPARISON:  None    TECHNIQUE:  Axial CT was performed through the maxillofacial bones. Coronal and  sagittal reconstructions were obtained.  CT dose reduction was achieved through  use of a standardized protocol tailored for this examination and automatic  exposure control for dose modulation.    FINDINGS:    The facial bones are intact. The paranasal sinuses are clear. Minimal right  supraorbital and facial soft tissue swelling. The globes and lenses are intact.      Impression IMPRESSION:    No acute facial bone fracture. Minimal soft tissue swelling in the right face  and supraorbital region          Reviewed records in [facility] and media tab today    Lab Review   Results for orders placed or performed during the hospital encounter of 10/30/17   CULTURE, URINE   Result Value Ref Range    Special Requests: NO SPECIAL REQUESTS  Reflexed from [id number]        Colony Count 80000  COLONIES/mL        Culture result: MIXED UROGENITAL FLORA ISOLATED     DRUG SCREEN, URINE   Result Value Ref Range    AMPHETAMINES NEGATIVE  NEG      BARBITURATES NEGATIVE  NEG      BENZODIAZEPINES POSITIVE (A) NEG      COCAINE NEGATIVE  NEG      METHADONE NEGATIVE  NEG      OPIATES NEGATIVE  NEG      PCP(PHENCYCLIDINE) NEGATIVE  NEG      THC (TH-CANNABINOL) NEGATIVE  NEG      Drug screen comment (NOTE)    URINALYSIS W/ REFLEX CULTURE   Result Value Ref Range    Color YELLOW/STRAW      Appearance CLEAR CLEAR      Specific gravity 1.030 1.003 - 1.030      pH (UA) 6.0 5.0 - 8.0      Protein TRACE (A) NEG mg/dL    Glucose NEGATIVE  NEG mg/dL  Ketone TRACE (A) NEG mg/dL     Blood NEGATIVE  NEG      Urobilinogen 1.0 0.2 - 1.0 EU/dL    Nitrites NEGATIVE  NEG      Leukocyte Esterase MODERATE (A) NEG      WBC 50-100 0 - 4 /hpf    RBC 0-5 0 - 5 /hpf    Epithelial cells FEW FEW /lpf    Bacteria 1+ (A) NEG /hpf    UA:UC IF INDICATED URINE CULTURE ORDERED (A) CNI      Mucus 2+ (A) NEG /lpf   BILIRUBIN, CONFIRM   Result Value Ref Range    Bilirubin UA, confirm NEGATIVE  NEG        Limited due to virtual visit.  Exam:  Visit Vitals  Ht  (1.6 m)   Wt 68 kg (150 lb)   BMI 26.57 kg/m??     General:  Alert, cooperative, no distress.    Head:  Normocephalic, without obvious abnormality, atraumatic.   Respiratory:  Heart:   Non labored breathing     Carotids:    Extremities:    Pulses:        Neurologic:  MS: Alert and oriented x 4, speech intact. Language - inact. Attention and fund of knowledge appropriate.  Recent and remote memory intact.  Cranial Nerves:  II: visual fields    II: pupils    II: optic disc    III,VII: ptosis Mild right ptosis - stable   III,IV,VI: extraocular muscles  EOMI, no nystagmus seen, no diplopia   V: facial light touch sensation     VII: facial muscle function   symmetric   VIII: hearing intact   IX: soft palate elevation  Elevated sym   XI: trapezius strength     XI: sternocleidomastoid strength    XII: tongue  Tongue midline     Motor: normal bulk and tone, no tremor, no PD  Sensory: (At previous visits: Intact to LT/PP throughout)  Coordination: FTN and RAM intact  Gait: normal gait   Reflexes: Unable to assess       Assessment/Plan   Pt is a 63 y.o. right handed female with h/o radiographic changes c/w MS, but no clinical symptoms, stable MRI's since 2011, the last MRI brain 02/17/17. Additionally, has RLS, with an ill-defined irritating sensation in her legs with urge to move her legs beginning around 6PM and keeping her awake and inhibiting her activities, though question if there may be some component of psych issues contributing to her need to walk. Fairly  well controlled on Horizant  bid and Requip  daily. In past, c/o memory loss, not today, suspected due to underlying mood disorder/personality d/o, and stress, and this suspicion was confirmed on neuropsychological testing with Dr. Maren Reamer in April, 2019.  Pt reports ongoing falls, all of which sound situational and not surprising under the circumstances, unrelated to an underlying neurological issue.  Has PT ordered and has been told to use a cane, but has not done these things.  HAs increased in frequency recently.  Exam is stable, buy limited due to VV, no nystagmus seen, right ptosis, steady gait, o/w unremarkable. Suspect HAs related to tension and anxiety regarding current events, pt in agreement. Will watch and wait to see if HAs improve over time.   -Continue Horizant  bid and Requip  qhs for RLS,     -Continue psychotherapy for mood disorders/stress.   -Continue ASA  daily and Pravachol.   -Falling  for unclear reasons, events continue to sound mechanical and/or related to poor judgement.  -F/u in clinic in 6 months, instructed to call in the interim if needed.     ICD-10-CM ICD-9-CM    1. Gait disturbance R26.9 781.2    2. RLS (restless legs syndrome) G25.81 333.94 gabapentin enacarbil (Horizant) 600 mg TbER      DISCONTINUED: gabapentin enacarbil (Horizant) 600 mg TbER   3. Falls frequently R29.6 V15.88    4. Abnormal brain scan R94.02 794.09        Signed:  Jodi Mourning, MD  10/11/2018

## 2018-10-23 NOTE — Telephone Encounter (Signed)
Pt is calling because she says she has been experiencing this thing where when she is walking, just normal walking her feet get out of control, like there moving faster than her body and she has to catch or to them so she doesn't fall. She has not fallen as of yet but has had to catch herself. Said she can move forward or backwards to catch herself. I did ask pt if she was feeling any other symptoms and she stated she was tired and has a headache and her right eye is aching. Stated it could be allergies. Please call.

## 2018-10-23 NOTE — Telephone Encounter (Signed)
Dr. Alison Murray you just saw this pt via VV 2 weeks ago Please advise on new walking challenges.  Thanks  SLA

## 2018-10-24 NOTE — Telephone Encounter (Signed)
Spoke with pt she notes she thought you would order PT, but with the virus going on she would rather not go to a facility/ or have someone come to her home. She notes she will try some exercises on her own and hopes it gets a little better. She wants  you to stay safe and she noted to tell you Hello and she hopes you are doing okay.

## 2018-10-24 NOTE — Telephone Encounter (Signed)
Well, that was nice of her.  I understand her reservations to go to PT.

## 2018-10-24 NOTE — Telephone Encounter (Signed)
Lindsay [name] - This is not a new issue, just a recurrence. No etiology has been found in the past.  I am happy to send her to PT again.

## 2019-04-16 ENCOUNTER — Telehealth: Admit: 2019-04-16 | Payer: MEDICARE | Attending: Neurology | Primary: Internal Medicine

## 2019-04-16 ENCOUNTER — Telehealth: Attending: Neurology | Primary: Internal Medicine

## 2019-04-16 DIAGNOSIS — G2581 Restless legs syndrome: Secondary | ICD-10-CM

## 2019-04-16 NOTE — Progress Notes (Signed)
Neurology Progress Note    HISTORY PROVIDED BY: patient and daughter, Stephanee Barcomb is a 63 y.o. female who was seen by synchronous (real-time) audio-video technology on 04/16/2019.  Two factor identification completed.     Consent:  She and/or her healthcare decision maker is aware that this patient-initiated Telehealth encounter is a billable service, with coverage as determined by her insurance carrier. She is aware that she may receive a bill and has provided verbal consent to proceed: Yes    I was in the office while conducting this encounter.    I discussed with the patient the nature of our telemedicine visit: Our sessions are not being recorded and personal health information is protected. We will provide follow up care in person when the patient needs it.     Pursuant to the emergency declaration under the [place], 1135 waiver authority and the [employer] Act, this Virtual  Visit was conducted, with patient's consent, to reduce the patient's risk of exposure to COVID-19 and provide continuity of care for an established patient.    Chief Complaint:   Chief Complaint   Patient presents with   ??? Restless Leg Syndrome      Subjective:   Pt is a 63 y.o. right handed female last seen virtually on 10/11/18 in f/u for h/o radiographic changes c/w MS, but no clinical symptoms, stable MRI's since 2011, the last MRI brain 02/17/17. Additionally, has RLS, with an ill-defined irritating sensation in her legs with urge to move her legs beginning around 6PM and keeping her awake and inhibiting her activities, though question if there may be some component of psych issues contributing to her need to walk. Fairly well controlled on Horizant 600mg  bid and Requip 4mg  daily.  Pt reported ongoing falls, all of which sound situational and not surprising under the circumstances, unrelated to an underlying neurological issue.  Has  PT ordered and has been told to use a cane, but has not done these things.  HAs increased in frequency recently.  Exam was stable, buy limited due to VV, no nystagmus seen, right ptosis, steady gait, o/w unremarkable. Suspected HAs related to tension and anxiety regarding current events, pt in agreement. Plan to watch and wait to see if HAs improve over time.   -Continued Horizant 600mg  bid and Requip 4mg  qhs for RLS,     -Continued psychotherapy for mood disorders/stress.   -Continued ASA 81mg  daily and Pravachol.   -Falling for unclear reasons, events continue to sound mechanical and/or related to poor judgement.    This is a virtual f/u visit.  RLS sxs are well controlled on Horizant and Requip if she takes them on time, taking them at 2P and 6P.  She had a fall in August, was walking at daughter's home and right foot just turned over, sustained a fracture. She was seen in ortho, was wearing a compressive bandage only.  Healed now.  No new health issues.  Not using cane. Feels like she cannot take the time to go to PT due to deadlines at work.  Has noticed her pinky will shake when she holds it, like when having her nails done.  Occasionally, legs will jerk while in bed, just once and quick and then she is fine.     Previous testing:  In past, c/o memory loss, suspected due to underlying mood disorder/personality d/o, and stress, and this suspicion was confirmed on  neuropsychological testing with Dr. Maren Reamer'Grady in April, 2019.    Past Medical History:   Diagnosis Date   ??? Abnormal brain scan    ??? Anxiety disorder    ??? Arthritis    ??? Chronic pain    ??? Depression    ??? DVT (deep venous thrombosis) (HCC)    ??? Falls frequently    ??? Fracture of left ankle    ??? Headache(784.0) 2011   ??? Ill-defined condition 09/15/2015    FX R HAND   ??? 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 Other and unspecified hyperlipidemia 12/27/2012   ??? PE (pulmonary embolism) 12/2012   ??? RLS (restless legs syndrome) 03/16/2016    Dr Scarlett PrestoMary Dustin Burrill   ??? TIA  (transient ischemic attack) 03/2016    Right facial droop, numbness in lip   ??? Torn rotator cuff     right      Past Surgical History:   Procedure Laterality Date   ??? BREAST SURGERY PROCEDURE UNLISTED  2002    AUGMENTATION   ??? HX APPENDECTOMY     ??? HX CESAREAN SECTION      x4   ??? HX GI      COLONOSCOPY   ??? 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 HX ORTHOPAEDIC  08/2004    rotater cuff r. shoulder   ??? HX ORTHOPAEDIC      left ankle fracture treated with air cast   ??? HX ORTHOPAEDIC  6/13    left shoulder fxt and dislocation   ??? HX OTHER SURGICAL  2010    left foot torn tendon   ??? HX TUBAL LIGATION     ??? IMPLANT BREAST SILICONE/EQ  2002    Bilateral breast implants      Social History     Socioeconomic History   ??? Marital status: MARRIED     Spouse name: Not on file   ??? Number of children: Not on file   ??? Years of education: Not on file   ??? Highest education level: Not on file   Occupational History   ??? Not on file   Social Needs   ??? Financial resource strain: Not on file   ??? Food insecurity     Worry: Not on file     Inability: Not on file   ??? Transportation needs     Medical: Not on file     Non-medical: Not on file   Tobacco Use   ??? Smoking status: Never Smoker   ??? Smokeless tobacco: Never Used   Substance and Sexual Activity   ??? Alcohol use: No     Comment: Pt denies   ??? Drug use: No   ??? Sexual activity: Yes     Partners: Male   Lifestyle   ??? Physical activity     Days per week: Not on file     Minutes per session: Not on file   ??? Stress: Not on file   Relationships   ??? Social [employer]     Talks on phone: Not on file     Gets together: Not on file     Attends religious service: Not on file     Active member of club or organization: Not on file     Attends meetings of clubs or organizations: Not on file     Relationship status: Not on file   ??? Intimate partner violence     Fear of current or ex partner: Not on file  Emotionally abused: Not on file     Physically abused: Not on file     Forced sexual activity:  Not on file   Other Topics Concern   ??? Not on file   Social History Narrative   ??? Not on file     Family History   Problem Relation Age of Onset   ??? Heart Disease Mother    ??? Heart Disease Father    ??? Cancer Father         thyroid cancer   ??? Arthritis-rheumatoid Sister    ??? No Known Problems Son    ??? No Known Problems Daughter    ??? No Known Problems Daughter    ??? No Known Problems Daughter    ??? Anesth Problems Neg Hx           Objective:   ROS:  Per HPI or PMH o/w reviewed and neg    No Known Allergies    Meds:  Outpatient Medications Prior to Visit   Medication Sig Dispense Refill   ??? gabapentin enacarbil (Horizant) 600 mg TbER TAKE 1 TABLET BY MOUTH TWICE DAILY 180 Tab 1   ??? rOPINIRole (REQUIP) 1 mg tablet TAKE 4 TABLETS BY MOUTH 1 TO 3 HOURS BEFORE BEDTIME EVERYDAY (Patient taking differently: TAKE 1 TABLET AT 1-2PM AND TAKE 3 TABLETS BY MOUTH 1 TO 3 HOURS BEFORE BEDTIME EVERYDAY) 360 Tab 1   ??? ibuprofen (MOTRIN) 600 mg tablet Take 1 Tab by mouth every six (6) hours as needed for Pain. 20 Tab 0   ??? buPROPion XL (WELLBUTRIN XL) 150 mg tablet 150 mg two (2) times a day. Take 1 tab BID  0   ??? pravastatin (PRAVACHOL) 40 mg tablet 1 po every day Please make a med check appointment in the next month. Thank you. 30 Tab 0   ??? aspirin 81 mg chewable tablet Take 1 Tab by mouth daily. 30 Tab 0   ??? ergocalciferol (ERGOCALCIFEROL) 50,000 unit capsule Take 1 Cap by mouth every seven (7) days. 12 Cap 1   ??? escitalopram oxalate (LEXAPRO) 20 mg tablet Take 1 Tab by mouth daily. 30 Tab 5   ??? omeprazole (PRILOSEC) 20 mg capsule Take 20 mg by mouth nightly.       No facility-administered medications prior to visit.        Imaging:  MRI Results (most recent):  Results from Hospital Encounter encounter on 02/17/17   MRI BRAIN W WO CONT    Narrative EXAM:  MRI BRAIN W WO CONT    INDICATION:    Pt with MRI findings concerning for MS, but no clinical history  to suggest MS, now with new bilateral rotary nystagmus    COMPARISON:  MRI  brain 03/27/2016.    CONTRAST: 15 ml Dotarem.    TECHNIQUE:    Multiplanar multisequence acquisition without and with contrast of the brain.    FINDINGS:  Generalized parenchymal volume loss with commensurate dilation of the sulci and  ventricular system. Unchanged periventricular and deep white matter T2/FLAIR  hyperintensities, in a pattern most consistent with chronic microvascular  ischemic disease. No involvement of the callosal septal interface. Small chronic  infarct in the right cerebellum. There is no acute infarct, hemorrhage,  extra-axial fluid collection, or mass effect. There is no cerebellar tonsillar  herniation. Expected arterial flow-voids are present. No evidence of abnormal  enhancement.    The paranasal sinuses, mastoid air cells, and middle ears are clear. The orbital  contents  are within normal limits. No significant osseous or scalp lesions are  identified.      Impression IMPRESSION:   1. No evidence of acute intracranial abnormality.  2. Unchanged generalized parenchymal volume loss and chronic microvascular  ischemic disease. No evidence of demyelinating disease as clinically queried.      CT Results (most recent):  Results from Hospital Encounter encounter on 10/30/17   CT MAXILLOFACIAL WO CONT    Narrative INDICATION: Fall, abrasion Pain, max-facial     EXAMINATION:  MAXILLOFACIAL CT    COMPARISON:  None    TECHNIQUE:  Axial CT was performed through the maxillofacial bones. Coronal and  sagittal reconstructions were obtained.  CT dose reduction was achieved through  use of a standardized protocol tailored for this examination and automatic  exposure control for dose modulation.    FINDINGS:    The facial bones are intact. The paranasal sinuses are clear. Minimal right  supraorbital and facial soft tissue swelling. The globes and lenses are intact.      Impression IMPRESSION:    No acute facial bone fracture. Minimal soft tissue swelling in the right face  and supraorbital region           Reviewed records in [facility] and media tab today    Lab Review   Results for orders placed or performed during the hospital encounter of 10/30/17   CULTURE, URINE    Specimen: Urine   Result Value Ref Range    Special Requests: NO SPECIAL REQUESTS  Reflexed from [id number]        Colony Count 80000  COLONIES/mL        Culture result: MIXED UROGENITAL FLORA ISOLATED     DRUG SCREEN, URINE   Result Value Ref Range    AMPHETAMINES NEGATIVE  NEG      BARBITURATES NEGATIVE  NEG      BENZODIAZEPINES POSITIVE (A) NEG      COCAINE NEGATIVE  NEG      METHADONE NEGATIVE  NEG      OPIATES NEGATIVE  NEG      PCP(PHENCYCLIDINE) NEGATIVE  NEG      THC (TH-CANNABINOL) NEGATIVE  NEG      Drug screen comment (NOTE)    URINALYSIS W/ REFLEX CULTURE    Specimen: Miscellaneous sample    Urine specimen   Result Value Ref Range    Color YELLOW/STRAW      Appearance CLEAR CLEAR      Specific gravity 1.030 1.003 - 1.030      pH (UA) 6.0 5.0 - 8.0      Protein TRACE (A) NEG mg/dL    Glucose NEGATIVE  NEG mg/dL    Ketone TRACE (A) NEG mg/dL    Blood NEGATIVE  NEG      Urobilinogen 1.0 0.2 - 1.0 EU/dL    Nitrites NEGATIVE  NEG      Leukocyte Esterase MODERATE (A) NEG      WBC 50-100 0 - 4 /hpf    RBC 0-5 0 - 5 /hpf    Epithelial cells FEW FEW /lpf    Bacteria 1+ (A) NEG /hpf    UA:UC IF INDICATED URINE CULTURE ORDERED (A) CNI      Mucus 2+ (A) NEG /lpf   BILIRUBIN, CONFIRM   Result Value Ref Range    Bilirubin UA, confirm NEGATIVE  NEG        Limited due to virtual visit.  Exam:  There were no vitals taken for this  visit.  General:  Alert, cooperative, no distress.    Head:  Normocephalic, without obvious abnormality, atraumatic.   Respiratory:  Heart:   Non labored breathing     Carotids:    Extremities:    Pulses:        Neurologic:  MS: Alert and oriented x 4, speech intact. Language - inact. Attention and fund of knowledge appropriate.  Recent and remote memory intact.  Cranial Nerves:  II: visual fields    II: pupils    II: optic  disc    III,VII: ptosis Mild right ptosis - stable   III,IV,VI: extraocular muscles  EOMI, no nystagmus seen, no diplopia   V: facial light touch sensation     VII: facial muscle function   symmetric   VIII: hearing intact   IX: soft palate elevation     XI: trapezius strength     XI: sternocleidomastoid strength    XII: tongue       Motor: normal bulk and tone, no tremor, no PD  Sensory: (At previous visits: Intact to LT/PP throughout)  Coordination: FTN intact  Gait: normal gait   Reflexes: Unable to assess       Assessment/Plan   Pt is a 63 y.o. right handed female with h/o radiographic changes c/w MS, but no clinical symptoms, stable MRI's since 2011, the last MRI brain 02/17/17. Additionally, has RLS, with an ill-defined irritating sensation in her legs with urge to move her legs beginning around 6PM and keeping her awake and inhibiting her activities, though question if there may be some component of psych issues contributing to her need to walk. Fairly well controlled on Horizant  bid and Requip  daily. Quick leg jerk at night sounds c/w hypnic jerk. Tremor in pinky sounds normal, will assess at f/u. Pt reports ongoing falls, all of which sound situational/musculoskeletal, now with right ankle fx.  Has had PT ordered and has been told to use a cane, but has not done these things.  Exam is stable, buy limited due to VV, no nystagmus seen, right ptosis, steady gait, o/w unremarkable.   -Continue Horizant  bid and Requip  qhs for RLS,     -Continue ASA  daily and Pravachol.   -F/u in clinic, not virtually, in 6 months, instructed to call in the interim if needed.     ICD-10-CM ICD-9-CM    1. RLS (restless legs syndrome)  G25.81 333.94    2. Falls frequently  R29.6 V15.88        Signed:  Jodi Mourning, MD  04/16/2019

## 2019-04-16 NOTE — Progress Notes (Signed)
Neurology Progress Note    HISTORY PROVIDED BY: patient and daughter, Lindsay [name]    Lindsay [name] is a 63 y.o. female who was seen by synchronous (real-time) audio-video technology on 04/16/2019.  Two factor identification completed.     Consent:  She and/or her healthcare decision maker is aware that this patient-initiated Telehealth encounter is a billable service, with coverage as determined by her insurance carrier. She is aware that she may receive a bill and has provided verbal consent to proceed: Yes    I was in the office while conducting this encounter.    I discussed with the patient the nature of our telemedicine visit: Our sessions are not being recorded and personal health information is protected. We will provide follow up care in person when the patient needs it.     Pursuant to the emergency declaration under the [facility] Act and the [employer] Emergencies Act, 1135 waiver authority and the [employer]onavirus Preparedness and [employer] Supplemental Appropriations Act, this Virtual  Visit was conducted, with patient's consent, to reduce the patient's risk of exposure to COVID-19 and provide continuity of care for an established patient.    Chief Complaint:   Chief Complaint   Patient presents with   ??? Restless Leg Syndrome      Subjective:   Pt is a 63 y.o. right handed female last seen virtually on 10/11/18 in f/u for h/o radiographic changes c/w MS, but no clinical symptoms, stable MRI's since 2011, the last MRI brain 02/17/17. Additionally, has RLS, with an ill-defined irritating sensation in her legs with urge to move her legs beginning around 6PM and keeping her awake and inhibiting her activities, though question if there may be some component of psych issues contributing to her need to walk. Fairly well controlled on Horizant 600mg  bid and Requip 4mg  daily.  Pt reported ongoing falls, all of which sound situational and not surprising under the circumstances, unrelated to an  underlying neurological issue.  Has PT ordered and has been told to use a cane, but has not done these things.  HAs increased in frequency recently.  Exam was stable, buy limited due to VV, no nystagmus seen, right ptosis, steady gait, o/w unremarkable. Suspected HAs related to tension and anxiety regarding current events, pt in agreement. Plan to watch and wait to see if HAs improve over time.   -Continued Horizant 600mg  bid and Requip 4mg  qhs for RLS,     -Continued psychotherapy for mood disorders/stress.   -Continued ASA 81mg  daily and Pravachol.   -Falling for unclear reasons, events continue to sound mechanical and/or related to poor judgement.    This is a virtual f/u visit.  RLS sxs are well controlled on Horizant and Requip if she takes them on time, taking them at 2P and 6P.  She had a fall in August, was walking at daughter's home and right foot just turned over, sustained a fracture. She was seen in ortho, was wearing a compressive bandage only.  Healed now.  No new health issues.  Not using cane. Feels like she cannot take the time to go to PT due to deadlines at work.  Has noticed her pinky will shake when she holds it, like when having her nails done.  Occasionally, legs will jerk while in bed, just once and quick and then she is fine.     Previous testing:  In past, c/o memory loss, suspected due to underlying mood disorder/personality d/o, and stress, and this suspicion was confirmed on  neuropsychological testing with Dr. Maren Reamer in April, 2019.    Past Medical History:   Diagnosis Date   ??? Abnormal brain scan    ??? Anxiety disorder    ??? Arthritis    ??? Chronic pain    ??? Depression    ??? DVT (deep venous thrombosis) (HCC)    ??? Falls frequently    ??? Fracture of left ankle    ??? Headache(784.0) 2011   ??? Ill-defined condition 09/15/2015    FX R HAND   ??? 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 Other and unspecified hyperlipidemia 12/27/2012   ??? PE (pulmonary embolism) 12/2012    ??? RLS (restless legs syndrome) 03/16/2016    Dr Scarlett Presto   ??? TIA (transient ischemic attack) 03/2016    Right facial droop, numbness in lip   ??? Torn rotator cuff     right      Past Surgical History:   Procedure Laterality Date   ??? BREAST SURGERY PROCEDURE UNLISTED  2002    AUGMENTATION   ??? HX APPENDECTOMY     ??? HX CESAREAN SECTION      x4   ??? HX GI      COLONOSCOPY   ??? 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 HX ORTHOPAEDIC  08/2004    rotater cuff r. shoulder   ??? HX ORTHOPAEDIC      left ankle fracture treated with air cast   ??? HX ORTHOPAEDIC  6/13    left shoulder fxt and dislocation   ??? HX OTHER SURGICAL  2010    left foot torn tendon   ??? HX TUBAL LIGATION     ??? IMPLANT BREAST SILICONE/EQ  2002    Bilateral breast implants      Social History     Socioeconomic History   ??? Marital status: MARRIED     Spouse name: Not on file   ??? Number of children: Not on file   ??? Years of education: Not on file   ??? Highest education level: Not on file   Occupational History   ??? Not on file   Social Needs   ??? Financial resource strain: Not on file   ??? Food insecurity     Worry: Not on file     Inability: Not on file   ??? Transportation needs     Medical: Not on file     Non-medical: Not on file   Tobacco Use   ??? Smoking status: Never Smoker   ??? Smokeless tobacco: Never Used   Substance and Sexual Activity   ??? Alcohol use: No     Comment: Pt denies   ??? Drug use: No   ??? Sexual activity: Yes     Partners: Male   Lifestyle   ??? Physical activity     Days per week: Not on file     Minutes per session: Not on file   ??? Stress: Not on file   Relationships   ??? Social Wellsite geologist on phone: Not on file     Gets together: Not on file     Attends religious service: Not on file     Active member of club or organization: Not on file     Attends meetings of clubs or organizations: Not on file     Relationship status: Not on file   ??? Intimate partner violence     Fear of current or ex partner: Not on file  Emotionally abused: Not on file     Physically abused: Not on file     Forced sexual activity: Not on file   Other Topics Concern   ??? Not on file   Social History Narrative   ??? Not on file     Family History   Problem Relation Age of Onset   ??? Heart Disease Mother    ??? Heart Disease Father    ??? Cancer Father         thyroid cancer   ??? Arthritis-rheumatoid Sister    ??? No Known Problems Son    ??? No Known Problems Daughter    ??? No Known Problems Daughter    ??? No Known Problems Daughter    ??? Anesth Problems Neg Hx           Objective:   ROS:  Per HPI or PMH o/w reviewed and neg    No Known Allergies    Meds:  Outpatient Medications Prior to Visit   Medication Sig Dispense Refill   ??? gabapentin enacarbil (Horizant) 600 mg TbER TAKE 1 TABLET BY MOUTH TWICE DAILY 180 Tab 1   ??? rOPINIRole (REQUIP) 1 mg tablet TAKE 4 TABLETS BY MOUTH 1 TO 3 HOURS BEFORE BEDTIME EVERYDAY (Patient taking differently: TAKE 1 TABLET AT 1-2PM AND TAKE 3 TABLETS BY MOUTH 1 TO 3 HOURS BEFORE BEDTIME EVERYDAY) 360 Tab 1   ??? ibuprofen (MOTRIN) 600 mg tablet Take 1 Tab by mouth every six (6) hours as needed for Pain. 20 Tab 0   ??? buPROPion XL (WELLBUTRIN XL) 150 mg tablet 150 mg two (2) times a day. Take 1 tab BID  0   ??? pravastatin (PRAVACHOL) 40 mg tablet 1 po every day Please make a med check appointment in the next month. Thank you. 30 Tab 0   ??? aspirin 81 mg chewable tablet Take 1 Tab by mouth daily. 30 Tab 0   ??? ergocalciferol (ERGOCALCIFEROL) 50,000 unit capsule Take 1 Cap by mouth every seven (7) days. 12 Cap 1   ??? escitalopram oxalate (LEXAPRO) 20 mg tablet Take 1 Tab by mouth daily. 30 Tab 5   ??? omeprazole (PRILOSEC) 20 mg capsule Take 20 mg by mouth nightly.       No facility-administered medications prior to visit.        Imaging:  MRI Results (most recent):  Results from Hospital Encounter encounter on 02/17/17   MRI BRAIN W WO CONT    Narrative EXAM:  MRI BRAIN W WO CONT     INDICATION:    Pt with MRI findings concerning for MS, but no clinical history  to suggest MS, now with new bilateral rotary nystagmus    COMPARISON:  MRI brain 03/27/2016.    CONTRAST: 15 ml Dotarem.    TECHNIQUE:    Multiplanar multisequence acquisition without and with contrast of the brain.    FINDINGS:  Generalized parenchymal volume loss with commensurate dilation of the sulci and  ventricular system. Unchanged periventricular and deep white matter T2/FLAIR  hyperintensities, in a pattern most consistent with chronic microvascular  ischemic disease. No involvement of the callosal septal interface. Small chronic  infarct in the right cerebellum. There is no acute infarct, hemorrhage,  extra-axial fluid collection, or mass effect. There is no cerebellar tonsillar  herniation. Expected arterial flow-voids are present. No evidence of abnormal  enhancement.    The paranasal sinuses, mastoid air cells, and middle ears are clear. The orbital  contents are  within normal limits. No significant osseous or scalp lesions are  identified.      Impression IMPRESSION:   1. No evidence of acute intracranial abnormality.  2. Unchanged generalized parenchymal volume loss and chronic microvascular  ischemic disease. No evidence of demyelinating disease as clinically queried.      CT Results (most recent):  Results from Hospital Encounter encounter on 10/30/17   CT MAXILLOFACIAL WO CONT    Narrative INDICATION: Fall, abrasion Pain, max-facial     EXAMINATION:  MAXILLOFACIAL CT    COMPARISON:  None    TECHNIQUE:  Axial CT was performed through the maxillofacial bones. Coronal and  sagittal reconstructions were obtained.  CT dose reduction was achieved through  use of a standardized protocol tailored for this examination and automatic  exposure control for dose modulation.    FINDINGS:    The facial bones are intact. The paranasal sinuses are clear. Minimal right   supraorbital and facial soft tissue swelling. The globes and lenses are intact.      Impression IMPRESSION:    No acute facial bone fracture. Minimal soft tissue swelling in the right face  and supraorbital region          Reviewed records in [facility] and media tab today    Lab Review   Results for orders placed or performed during the hospital encounter of 10/30/17   CULTURE, URINE    Specimen: Urine   Result Value Ref Range    Special Requests: NO SPECIAL REQUESTS  Reflexed from [id number]        Colony Count 80000  COLONIES/mL        Culture result: MIXED UROGENITAL FLORA ISOLATED     DRUG SCREEN, URINE   Result Value Ref Range    AMPHETAMINES NEGATIVE  NEG      BARBITURATES NEGATIVE  NEG      BENZODIAZEPINES POSITIVE (A) NEG      COCAINE NEGATIVE  NEG      METHADONE NEGATIVE  NEG      OPIATES NEGATIVE  NEG      PCP(PHENCYCLIDINE) NEGATIVE  NEG      THC (TH-CANNABINOL) NEGATIVE  NEG      Drug screen comment (NOTE)    URINALYSIS W/ REFLEX CULTURE    Specimen: Miscellaneous sample    Urine specimen   Result Value Ref Range    Color YELLOW/STRAW      Appearance CLEAR CLEAR      Specific gravity 1.030 1.003 - 1.030      pH (UA) 6.0 5.0 - 8.0      Protein TRACE (A) NEG mg/dL    Glucose NEGATIVE  NEG mg/dL    Ketone TRACE (A) NEG mg/dL    Blood NEGATIVE  NEG      Urobilinogen 1.0 0.2 - 1.0 EU/dL    Nitrites NEGATIVE  NEG      Leukocyte Esterase MODERATE (A) NEG      WBC 50-100 0 - 4 /hpf    RBC 0-5 0 - 5 /hpf    Epithelial cells FEW FEW /lpf    Bacteria 1+ (A) NEG /hpf    UA:UC IF INDICATED URINE CULTURE ORDERED (A) CNI      Mucus 2+ (A) NEG /lpf   BILIRUBIN, CONFIRM   Result Value Ref Range    Bilirubin UA, confirm NEGATIVE  NEG        Limited due to virtual visit.  Exam:  There were no vitals taken for this visit.  General:  Alert, cooperative, no distress.    Head:  Normocephalic, without obvious abnormality, atraumatic.   Respiratory:  Heart:   Non labored breathing     Carotids:    Extremities:    Pulses:         Neurologic:  MS: Alert and oriented x 4, speech intact. Language - inact. Attention and fund of knowledge appropriate.  Recent and remote memory intact.  Cranial Nerves:  II: visual fields    II: pupils    II: optic disc    III,VII: ptosis Mild right ptosis - stable   III,IV,VI: extraocular muscles  EOMI, no nystagmus seen, no diplopia   V: facial light touch sensation     VII: facial muscle function   symmetric   VIII: hearing intact   IX: soft palate elevation     XI: trapezius strength     XI: sternocleidomastoid strength    XII: tongue       Motor: normal bulk and tone, no tremor, no PD  Sensory: (At previous visits: Intact to LT/PP throughout)  Coordination: FTN intact  Gait: normal gait   Reflexes: Unable to assess       Assessment/Plan   Pt is a 63 y.o. right handed female with h/o radiographic changes c/w MS, but no clinical symptoms, stable MRI's since 2011, the last MRI brain 02/17/17. Additionally, has RLS, with an ill-defined irritating sensation in her legs with urge to move her legs beginning around 6PM and keeping her awake and inhibiting her activities, though question if there may be some component of psych issues contributing to her need to walk. Fairly well controlled on Horizant  bid and Requip  daily. Quick leg jerk at night sounds c/w hypnic jerk. Tremor in pinky sounds normal, will assess at f/u. Pt reports ongoing falls, all of which sound situational/musculoskeletal, now with right ankle fx.  Has had PT ordered and has been told to use a cane, but has not done these things.  Exam is stable, buy limited due to VV, no nystagmus seen, right ptosis, steady gait, o/w unremarkable.   -Continue Horizant  bid and Requip  qhs for RLS,     -Continue ASA  daily and Pravachol.   -F/u in clinic, not virtually, in 6 months, instructed to call in the interim if needed.     ICD-10-CM ICD-9-CM    1. RLS (restless legs syndrome)  G25.81 333.94    2. Falls frequently  R29.6 V15.88         Signed:  Jodi Mourning, MD  04/16/2019

## 2019-04-16 NOTE — Telephone Encounter (Signed)
Left voicemail for patient to call back to schedule in clinic follow up with Dr.Ransom for 6 months.

## 2019-04-16 NOTE — Patient Instructions (Signed)
PRESCRIPTION REFILL POLICY    [facility]   Statement to Patients  October 03, 2012      In an effort to ensure the large volume of patient prescription refills is processed in the most efficient and expeditious manner, we are asking our patients to assist us by calling your Pharmacy for all prescription refills, this will include also your  Mail Order Pharmacy. The pharmacy will contact our office electronically to continue the refill process.    Please do not wait until the last minute to call your pharmacy. We need at least 48 hours (2days) to fill prescriptions. We also encourage you to call your pharmacy before going to pick up your prescription to make sure it is ready.     With regard to controlled substance prescription refill requests (narcotic refills) that need to be picked up at our office, we ask your cooperation by providing us with at least 72 hours (3days) notice that you will need a refill.    We will not refill narcotic prescription refill requests after 4:00pm on any weekday, Monday through Thursday, or after 2:00pm on Fridays, or on the weekends.      We encourage everyone to explore another way of getting your prescription refill request processed using MyChart, our patient web portal through our electronic medical record system. MyChart is an efficient and effective way to communicate your medication request directly to the office and  downloadable as an app on your smart phone . MyChart also features a review functionality that allows you to view your medication list as well as leave messages for your physician. Are you ready to get connected? If so please review the attatched instructions or speak to any of our staff to get you set up right away!    Thank you so much for your cooperation. Should you have any questions please contact our Practice Administrator.    The Physicians and Staff,  [facility]

## 2019-04-18 ENCOUNTER — Encounter

## 2019-04-18 MED ORDER — HORIZANT ER 600 MG TABLET,EXTENDED RELEASE
600 mg | ORAL_TABLET | ORAL | 1 refills | Status: DC
Start: 2019-04-18 — End: 2019-04-28

## 2019-04-27 ENCOUNTER — Encounter

## 2019-04-27 NOTE — Telephone Encounter (Signed)
Requested Prescriptions     Pending Prescriptions Disp Refills   ??? gabapentin enacarbil (Horizant) 600 mg TbER 180 Tab 1

## 2019-04-27 NOTE — Telephone Encounter (Signed)
Dr. Alison Murray, please send in medication. eRx failed. Thanks

## 2019-04-28 MED ORDER — ROPINIROLE 1 MG TAB
1 mg | ORAL_TABLET | ORAL | 1 refills | Status: DC
Start: 2019-04-28 — End: 2019-10-19

## 2019-04-28 MED ORDER — HORIZANT ER 600 MG TABLET,EXTENDED RELEASE
600 mg | ORAL_TABLET | Freq: Two times a day (BID) | ORAL | 1 refills | Status: DC
Start: 2019-04-28 — End: 2019-11-02

## 2019-04-28 MED ORDER — HORIZANT ER 600 MG TABLET,EXTENDED RELEASE
600 mg | ORAL_TABLET | Freq: Two times a day (BID) | ORAL | 1 refills | Status: DC
Start: 2019-04-28 — End: 2019-04-28

## 2019-04-28 NOTE — Telephone Encounter (Signed)
Sent again, but I believe it failed again. Will print. Please fax after I sign it.

## 2019-05-24 ENCOUNTER — Encounter: Attending: Internal Medicine | Primary: Internal Medicine

## 2019-06-12 ENCOUNTER — Ambulatory Visit: Admit: 2019-06-12 | Payer: BLUE CROSS/BLUE SHIELD | Attending: Internal Medicine | Primary: Internal Medicine

## 2019-06-12 ENCOUNTER — Ambulatory Visit: Attending: Internal Medicine | Primary: Internal Medicine

## 2019-06-12 DIAGNOSIS — E785 Hyperlipidemia, unspecified: Secondary | ICD-10-CM

## 2019-06-12 MED ORDER — PRAVASTATIN 40 MG TAB
40 mg | ORAL_TABLET | ORAL | 5 refills | Status: DC
Start: 2019-06-12 — End: 2019-11-06

## 2019-06-12 NOTE — Progress Notes (Signed)
Her thyroid is good  pravachol 40 mg is helping but not at goal -offer increase to 80 mg dose versus work more on lifestyle and appt in 3-4 mo on 40 mg dose liver looks good

## 2019-06-12 NOTE — Progress Notes (Signed)
HISTORY OF PRESENT ILLNESS  Lindsay [name] is a 63 y.o. female.  HPI  Lindsay [name] was last seen by me in 2018.  I have been prescribing her Pravastatin 40, but she is out of it. She is due for labs.  Denies side effects.    Mood disorder, followed by Dr. Charlyn Minerva, who sees her every four months. Remains on Wellbutrin, Lexapro and Valium.    Restless leg.  An abnormal imaging of brain with question in the past of MS, being followed by Dr. 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  This has been quite stable.    Vitamin D deficiency.  Again, due for levels.  Has been on 50,000 units weekly.  Notes she has some trouble with word finding at times, but denies any recent focal weakness or numbness or slurring of speech.      Review of Systems   Constitutional: Positive for malaise/fatigue. Negative for chills, fever and weight loss.   Respiratory: Negative for cough, shortness of breath and wheezing.    Cardiovascular: Negative for chest pain, palpitations, orthopnea, leg swelling and PND.   Gastrointestinal: Negative for abdominal pain, diarrhea, heartburn, nausea and vomiting.   Musculoskeletal: Negative for myalgias.   Neurological: Negative for dizziness and headaches.   Psychiatric/Behavioral: Positive for memory loss. Negative for hallucinations and suicidal ideas.       Physical Exam  Vitals signs and nursing note reviewed.   Constitutional:       Appearance: She is well-developed.   HENT:      Head: Normocephalic and atraumatic.   Neck:      Musculoskeletal: Normal range of motion and neck supple.      Thyroid: No thyromegaly.      Vascular: No carotid bruit.   Cardiovascular:      Rate and Rhythm: Normal rate and regular rhythm.      Heart sounds: Normal heart sounds, S1 normal and S2 normal. No murmur.   Pulmonary:      Effort: Pulmonary effort is normal. No respiratory  distress.      Breath sounds: Normal breath sounds. No wheezing or rales.   Musculoskeletal:      Right lower leg: No edema.      Left lower leg: No edema.   Neurological:      General: No focal deficit present.      Mental Status: She is alert and oriented to person, place, and time.   Psychiatric:         Behavior: Behavior normal.         ASSESSMENT and PLAN  Diagnoses and all orders for this visit:    1. Dyslipidemia, goal LDL below 564  -     METABOLIC PANEL, COMPREHENSIVE; Future  -     LIPID PANEL; Future  -     TSH 3RD GENERATION; Future  -     pravastatin (PRAVACHOL) 40 mg tablet; 1 po every day .    2. Abnormal brain scan-con with neuro    3. RLS (restless legs syndrome)-notes better with inc dose of meds requip and horizant    4. Anxiety-cont with dr spanier    5. Encounter for immunization  -     PNEUMOCOCCAL POLYSACCHARIDE VACCINE, 23-VALENT, ADULT OR IMMUNOSUPPRESSED PT DOSE,    6. Breast cancer screening by mammogram  -  MAM 3D TOMO W MAMMO BI SCREENING INCL CAD; Future    7. Vitamin deficiency  -     VITAMIN D, 25 HYDROXY; Future  appt ion 55mo

## 2019-06-12 NOTE — Progress Notes (Signed)
Patient notified of cholesterol results. Patient wishes to stay on her current pravastatin dose & make lifestyle changes. Advised appt in 3-4 months for f/u. Advised other labs are fine.

## 2019-06-12 NOTE — Progress Notes (Signed)
Patient notified of cholesterol results. Patient wishes to stay on her current pravastatin dose & make lifestyle changes. Advised appt in 3-4 months for f/u. Advised other labs are fine.

## 2019-06-12 NOTE — Progress Notes (Signed)
HISTORY OF PRESENT ILLNESS  Lindsay [name] is a 63 y.o. female.  HPI  Lindsay [name] was last seen by me in 2018.  I have been prescribing her Pravastatin 40, but she is out of it. She is due for labs.  Denies side effects.    Mood disorder, followed by Dr. Charlyn Minerva, who sees her every four months. Remains on Wellbutrin, Lexapro and Valium.    Restless leg.  An abnormal imaging of brain with question in the past of MS, being followed by Dr. 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  This has been quite stable.    Vitamin D deficiency.  Again, due for levels.  Has been on 50,000 units weekly.  Notes she has some trouble with word finding at times, but denies any recent focal weakness or numbness or slurring of speech.      Review of Systems   Constitutional: Positive for malaise/fatigue. Negative for chills, fever and weight loss.   Respiratory: Negative for cough, shortness of breath and wheezing.    Cardiovascular: Negative for chest pain, palpitations, orthopnea, leg swelling and PND.   Gastrointestinal: Negative for abdominal pain, diarrhea, heartburn, nausea and vomiting.   Musculoskeletal: Negative for myalgias.   Neurological: Negative for dizziness and headaches.   Psychiatric/Behavioral: Positive for memory loss. Negative for hallucinations and suicidal ideas.       Physical Exam  Vitals signs and nursing note reviewed.   Constitutional:       Appearance: She is well-developed.   HENT:      Head: Normocephalic and atraumatic.   Neck:      Musculoskeletal: Normal range of motion and neck supple.      Thyroid: No thyromegaly.      Vascular: No carotid bruit.   Cardiovascular:      Rate and Rhythm: Normal rate and regular rhythm.      Heart sounds: Normal heart sounds, S1 normal and S2 normal. No murmur.   Pulmonary:       Effort: Pulmonary effort is normal. No respiratory distress.      Breath sounds: Normal breath sounds. No wheezing or rales.   Musculoskeletal:      Right lower leg: No edema.      Left lower leg: No edema.   Neurological:      General: No focal deficit present.      Mental Status: She is alert and oriented to person, place, and time.   Psychiatric:         Behavior: Behavior normal.         ASSESSMENT and PLAN  Diagnoses and all orders for this visit:    1. Dyslipidemia, goal LDL below 578  -     METABOLIC PANEL, COMPREHENSIVE; Future  -     LIPID PANEL; Future  -     TSH 3RD GENERATION; Future  -     pravastatin (PRAVACHOL) 40 mg tablet; 1 po every day .    2. Abnormal brain scan-con with neuro    3. RLS (restless legs syndrome)-notes better with inc dose of meds requip and horizant    4. Anxiety-cont with dr spanier    5. Encounter for immunization  -     PNEUMOCOCCAL POLYSACCHARIDE VACCINE, 23-VALENT, ADULT OR IMMUNOSUPPRESSED PT DOSE,    6. Breast cancer screening by mammogram  -  MAM 3D TOMO W MAMMO BI SCREENING INCL CAD; Future    7. Vitamin deficiency  -     VITAMIN D, 25 HYDROXY; Future  appt ion 32mo

## 2019-06-12 NOTE — Progress Notes (Signed)
Her thyroid is good  pravachol 40 mg is helping but not at goal -offer increase to 80 mg dose versus work more on lifestyle and appt in 3-4 mo on 40 mg dose liver looks good

## 2019-06-13 LAB — LIPID PANEL
CHOL/HDL Ratio: 4 (ref 0.0–5.0)
Chol/HDL Ratio: 4 (ref 0.0–5.0)
Cholesterol, Total: 231 MG/DL — ABNORMAL HIGH (ref <200)
Cholesterol, total: 231 MG/DL — ABNORMAL HIGH (ref <200)
HDL Cholesterol: 58 MG/DL
HDL: 58 MG/DL
LDL Calculated: 119.8 MG/DL — ABNORMAL HIGH (ref 0–100)
LDL, calculated: 119.8 MG/DL — ABNORMAL HIGH (ref 0–100)
Triglyceride: 266 MG/DL — ABNORMAL HIGH (ref <150)
Triglycerides: 266 MG/DL — ABNORMAL HIGH (ref <150)
VLDL Cholesterol Calculated: 53.2 MG/DL
VLDL, calculated: 53.2 MG/DL

## 2019-06-13 LAB — COMPREHENSIVE METABOLIC PANEL
ALT: 16 U/L (ref 12–78)
AST: 15 U/L (ref 15–37)
Albumin/Globulin Ratio: 1.4 (ref 1.1–2.2)
Albumin: 4.2 g/dL (ref 3.5–5.0)
Alkaline Phosphatase: 100 U/L (ref 45–117)
Anion Gap: 5 mmol/L (ref 5–15)
BUN: 14 MG/DL (ref 6–20)
Bun/Cre Ratio: 19 (ref 12–20)
CO2: 31 mmol/L (ref 21–32)
Calcium: 9.1 MG/DL (ref 8.5–10.1)
Chloride: 103 mmol/L (ref 97–108)
Creatinine: 0.75 MG/DL (ref 0.55–1.02)
EGFR IF NonAfrican American: >60 mL/min/{1.73_m2} (ref >60)
GFR African American: >60 mL/min/{1.73_m2} (ref >60)
Globulin: 2.9 g/dL (ref 2.0–4.0)
Glucose: 74 mg/dL (ref 65–100)
Potassium: 4.4 mmol/L (ref 3.5–5.1)
Sodium: 139 mmol/L (ref 136–145)
Total Bilirubin: 0.2 MG/DL (ref 0.2–1.0)
Total Protein: 7.1 g/dL (ref 6.4–8.2)

## 2019-06-13 LAB — TSH 3RD GENERATION
TSH: 1.77 u[IU]/mL (ref 0.36–3.74)
TSH: 1.77 u[IU]/mL (ref 0.36–3.74)

## 2019-06-13 LAB — METABOLIC PANEL, COMPREHENSIVE
A-G Ratio: 1.4 (ref 1.1–2.2)
ALT (SGPT): 16 U/L (ref 12–78)
AST (SGOT): 15 U/L (ref 15–37)
Albumin: 4.2 g/dL (ref 3.5–5.0)
Alk. phosphatase: 100 U/L (ref 45–117)
Anion gap: 5 mmol/L (ref 5–15)
BUN/Creatinine ratio: 19 (ref 12–20)
BUN: 14 MG/DL (ref 6–20)
Bilirubin, total: 0.2 MG/DL (ref 0.2–1.0)
CO2: 31 mmol/L (ref 21–32)
Calcium: 9.1 MG/DL (ref 8.5–10.1)
Chloride: 103 mmol/L (ref 97–108)
Creatinine: 0.75 MG/DL (ref 0.55–1.02)
GFR est AA: >60 mL/min/{1.73_m2} (ref >60)
GFR est non-AA: >60 mL/min/{1.73_m2} (ref >60)
Globulin: 2.9 g/dL (ref 2.0–4.0)
Glucose: 74 mg/dL (ref 65–100)
Potassium: 4.4 mmol/L (ref 3.5–5.1)
Protein, total: 7.1 g/dL (ref 6.4–8.2)
Sodium: 139 mmol/L (ref 136–145)

## 2019-06-19 ENCOUNTER — Encounter

## 2019-08-13 ENCOUNTER — Ambulatory Visit: Payer: BLUE CROSS/BLUE SHIELD | Primary: Internal Medicine

## 2019-09-19 ENCOUNTER — Encounter: Attending: Internal Medicine | Primary: Internal Medicine

## 2019-10-16 ENCOUNTER — Encounter: Payer: BLUE CROSS/BLUE SHIELD | Attending: Internal Medicine | Primary: Internal Medicine

## 2019-10-19 MED ORDER — ROPINIROLE 1 MG TAB
1 mg | ORAL_TABLET | ORAL | 0 refills | Status: DC
Start: 2019-10-19 — End: 2020-01-09

## 2019-10-19 NOTE — Telephone Encounter (Signed)
Requested Prescriptions     Pending Prescriptions Disp Refills   ??? rOPINIRole (REQUIP) 1 mg tablet 360 Tab 1     Sig: TAKE 1 TABLET AT 1-2PM AND TAKE 3 TABLETS BY MOUTH 1 TO 3 HOURS BEFORE BEDTIME EVERYDAY     Patient is scheduled for a follow up on 4/20-she said she needs it to be a VV because her husband just got out of ICU with Covid and she is worried about going out. Okay for patient to be VV? Please advise.

## 2019-10-23 ENCOUNTER — Encounter: Attending: Neurology | Primary: Internal Medicine

## 2019-11-01 NOTE — Telephone Encounter (Addendum)
Okay to schedule patient as a VV?    ----- Message from Cornelious Bryant sent at 10/31/2019 11:13 AM EDT -----  Regarding: RANSON/MD/TELEPHONE  Contact: [phone]  General Message/Vendor Calls    Caller's first and last name: Lindsay [name]      Reason for call: Schedule a virtual appt      Callback required yes/no and why: Yes      Best contact number(s): [phone]      Details to clarify the request: Patient would like to schedule a virtual appt due to being exposed to COVID. Per patient a detail message can be left with a date and time of a virtual.      Cornelious Bryant

## 2019-11-01 NOTE — Telephone Encounter (Signed)
VV is fine

## 2019-11-02 ENCOUNTER — Encounter

## 2019-11-02 MED ORDER — HORIZANT ER 600 MG TABLET,EXTENDED RELEASE
600 mg | ORAL_TABLET | Freq: Two times a day (BID) | ORAL | 0 refills | Status: DC
Start: 2019-11-02 — End: 2020-01-09

## 2019-11-02 NOTE — Telephone Encounter (Signed)
Pt notified and verified regarding request for a v.v. Pt scheduled for July 7, 21 at 9:20am for v.v. Pt states needs a refill on Ropinirole 1mg  & Gabapentin Enacarbil 600mg  sent refill request to MD.

## 2019-11-06 ENCOUNTER — Ambulatory Visit: Admit: 2019-11-06 | Payer: BLUE CROSS/BLUE SHIELD | Attending: Internal Medicine | Primary: Internal Medicine

## 2019-11-06 ENCOUNTER — Ambulatory Visit: Attending: Internal Medicine | Primary: Internal Medicine

## 2019-11-06 DIAGNOSIS — E785 Hyperlipidemia, unspecified: Secondary | ICD-10-CM

## 2019-11-06 MED ORDER — ATORVASTATIN 10 MG TAB
10 mg | ORAL_TABLET | Freq: Every day | ORAL | 3 refills | Status: DC
Start: 2019-11-06 — End: 2021-05-25

## 2019-11-06 NOTE — Progress Notes (Signed)
HISTORY OF PRESENT ILLNESS  Lindsay [name] is a 64 y.o. female.  HPI  Seen for med check. She has been on Pravastatin 40 mg.  Last LDL was in the 119 range.  Discussed options.  Will switch to Atorvastatin 10 for goal LDL below 100.      One week of a cold feeling in left shin, no swelling, no pain, no cramping, no redness. Does have a history of PE postop, but has had no surgery or immobilization recently.    Stress at home with depressive symptoms, trying to get in touch with Dr. Salley Hews to help with medications.      Review of Systems   Constitutional: Negative for chills, fever and weight loss.   Respiratory: Negative for cough, shortness of breath and wheezing.    Cardiovascular: Negative for chest pain, palpitations, orthopnea, leg swelling and PND.   Gastrointestinal: Negative for heartburn and nausea.   Musculoskeletal: Negative for myalgias.   Neurological: Negative for dizziness and headaches.   Psychiatric/Behavioral: Positive for depression. Negative for suicidal ideas.       Physical Exam  Vitals signs and nursing note reviewed.   Constitutional:       Appearance: She is well-developed.   HENT:      Head: Normocephalic and atraumatic.   Neck:      Musculoskeletal: Normal range of motion and neck supple.      Thyroid: No thyromegaly.      Vascular: No carotid bruit.   Cardiovascular:      Rate and Rhythm: Normal rate and regular rhythm.      Heart sounds: Normal heart sounds, S1 normal and S2 normal. No murmur.   Pulmonary:      Effort: Pulmonary effort is normal. No respiratory distress.      Breath sounds: Normal breath sounds. No wheezing or rales.   Musculoskeletal:         General: No swelling or tenderness.      Right lower leg: No edema.      Left lower leg: No edema.   Neurological:      Mental Status: She is alert and oriented to person, place, and time.   Psychiatric:         Behavior: Behavior normal.         ASSESSMENT and PLAN  Diagnoses and all orders for this visit:    1. Dyslipidemia,  goal LDL below 100  -     METABOLIC PANEL, COMPREHENSIVE; Future  -     LIPID PANEL; Future  -     atorvastatin (LIPITOR) 10 mg tablet; Take 1 Tab by mouth daily.  -     METABOLIC PANEL, COMPREHENSIVE; Future  -     LIPID PANEL; Future    2. Left leg paresthesias      the following changes in treatment are made: none  Monitor leg sxs no signs of dvt currently and may be a nerve impingement   appt if persists

## 2019-11-06 NOTE — Progress Notes (Signed)
HISTORY OF PRESENT ILLNESS  Lindsay [name] is a 64 y.o. female.  HPI  Seen for med check. She has been on Pravastatin 40 mg.  Last LDL was in the 119 range.  Discussed options.  Will switch to Atorvastatin 10 for goal LDL below 100.      One week of a cold feeling in left shin, no swelling, no pain, no cramping, no redness. Does have a history of PE postop, but has had no surgery or immobilization recently.    Stress at home with depressive symptoms, trying to get in touch with Dr. Spanier to help with medications.      Review of Systems   Constitutional: Negative for chills, fever and weight loss.   Respiratory: Negative for cough, shortness of breath and wheezing.    Cardiovascular: Negative for chest pain, palpitations, orthopnea, leg swelling and PND.   Gastrointestinal: Negative for heartburn and nausea.   Musculoskeletal: Negative for myalgias.   Neurological: Negative for dizziness and headaches.   Psychiatric/Behavioral: Positive for depression. Negative for suicidal ideas.       Physical Exam  Vitals signs and nursing note reviewed.   Constitutional:       Appearance: She is well-developed.   HENT:      Head: Normocephalic and atraumatic.   Neck:      Musculoskeletal: Normal range of motion and neck supple.      Thyroid: No thyromegaly.      Vascular: No carotid bruit.   Cardiovascular:      Rate and Rhythm: Normal rate and regular rhythm.      Heart sounds: Normal heart sounds, S1 normal and S2 normal. No murmur.   Pulmonary:      Effort: Pulmonary effort is normal. No respiratory distress.      Breath sounds: Normal breath sounds. No wheezing or rales.   Musculoskeletal:         General: No swelling or tenderness.      Right lower leg: No edema.      Left lower leg: No edema.   Neurological:      Mental Status: She is alert and oriented to person, place, and time.   Psychiatric:         Behavior: Behavior normal.         ASSESSMENT and PLAN  Diagnoses and all orders for this visit:    1. Dyslipidemia,  goal LDL below 100  -     METABOLIC PANEL, COMPREHENSIVE; Future  -     LIPID PANEL; Future  -     atorvastatin (LIPITOR) 10 mg tablet; Take 1 Tab by mouth daily.  -     METABOLIC PANEL, COMPREHENSIVE; Future  -     LIPID PANEL; Future    2. Left leg paresthesias      the following changes in treatment are made: none  Monitor leg sxs no signs of dvt currently and may be a nerve impingement   appt if persists

## 2019-11-11 ENCOUNTER — Encounter

## 2019-11-15 NOTE — Telephone Encounter (Signed)
-----   Message from [employer] [place] sent at 11/15/2019 11:18 AM EDT -----  Regarding: Dr. Kerrie Buffalo  Pt is requesting a call and is stating that she called a week ago as well. Pt is requesting a medication authorization for "Horizant 600 MG" and would like the Rx sent to [facility] (on file). Best contact number [phone].

## 2019-11-15 NOTE — Telephone Encounter (Signed)
After looking into pt's medications it looks like Dr. Alison Murray attempted to send Horizant to the pharmacy but the transmission failed.

## 2019-11-15 NOTE — Telephone Encounter (Addendum)
Spoke w/ Pharmacist at [facility] informing them that Dr. Alison Murray attempted to send Horizant 600mg  but the transmission to pharmacy failed on 11/02/19. I gave a verbal order to pharmacist.

## 2019-12-06 ENCOUNTER — Emergency Department: Admit: 2019-12-07 | Payer: MEDICARE | Primary: Internal Medicine

## 2019-12-06 ENCOUNTER — Inpatient Hospital Stay
Admit: 2019-12-06 | Discharge: 2019-12-07 | Disposition: A | Discharge status: Home / Self Care | Payer: MEDICARE | Attending: Emergency Medicine

## 2019-12-06 DIAGNOSIS — S22080A Wedge compression fracture of T11-T12 vertebra, initial encounter for closed fracture: Secondary | ICD-10-CM

## 2019-12-06 NOTE — ED Provider Notes (Signed)
ED  Provider Notes by Mellody Drown, MD at 12/06/19 2025                Author: Mellody Drown, MD  Service: Emergency Medicine  Author Type: Physician       Filed: 12/07/19 0826  Date of Service: 12/06/19 2025  Status: Signed          Editor: Mellody Drown, MD (Physician)               EMERGENCY DEPARTMENT HISTORY AND PHYSICAL EXAM           Date: 12/06/2019   Patient Name: Lindsay [name]        History of Presenting Illness          Chief Complaint       Patient presents with        ?  Back Pain        ?  Fall           History Provided By: Patient      HPI: Lindsay [name],  64 y.o. female presents to the ED with cc of midline low back pain status post mechanical  fall.  Patient reports that she was coming down a step ladder to change the batteries on a device when she fell down directly onto her low back. States that she did not hit her head or lose consciousness.  Reports that she did not injure herself elsewhere  other than her mid back.  States that she was able to move her legs bilaterally immediately after the incident, and has no loss of sensation.  She denies any saddle anesthesia.  No radiation of pain into bilateral lower extremities.  States that there  is pain present at rest, but pain is exacerbated further by movement.  States that she was able to stand after the injury, but any movement worsens the pain, and thus was brought in for evaluation by EMS.      PCP: Clarene Critchley, MD        No current facility-administered medications on file prior to encounter.          Current Outpatient Medications on File Prior to Encounter          Medication  Sig  Dispense  Refill           ?  atorvastatin (LIPITOR) 10 mg tablet  Take 1 Tab by mouth daily.  30 Tab  3     ?  gabapentin enacarbil (Horizant) 600 mg TbER  Take 600 mg by mouth two (2) times a day. Max Daily Amount: 1,200 mg.  180 Tab  0     ?  rOPINIRole (REQUIP) 1 mg tablet  TAKE 1 TABLET AT 1-2PM AND TAKE 3 TABLETS BY MOUTH 1 TO 3 HOURS BEFORE  BEDTIME EVERYDAY  360 Tab  0     ?  diazePAM (VALIUM) 5 mg tablet  diazepam 5 mg tablet         ?  ibuprofen (MOTRIN) 600 mg tablet  Take 1 Tab by mouth every six (6) hours as needed for Pain.  20 Tab  0     ?  buPROPion XL (WELLBUTRIN XL) 150 mg tablet  150 mg two (2) times a day. Take 1 tab BID    0     ?  ergocalciferol (ERGOCALCIFEROL) 50,000 unit capsule  Take 1 Cap by mouth every seven (7) days.  12 Cap  1     ?  escitalopram oxalate (LEXAPRO) 20 mg tablet  Take 1 Tab by mouth daily.  30 Tab  5     ?  omeprazole (PRILOSEC) 20 mg capsule  Take 20 mg by mouth nightly.               ?  aspirin 81 mg chewable tablet  Take 1 Tab by mouth daily. (Patient not taking: Reported on 12/06/2019)  30 Tab  0             Past History        Past Medical History:     Past Medical History:        Diagnosis  Date         ?  Abnormal brain scan       ?  Anxiety disorder       ?  Arthritis       ?  Chronic pain       ?  Depression       ?  DVT (deep venous thrombosis) (HCC)       ?  Falls frequently       ?  Fracture of left ankle       ?  ELFYBOFB(510.2)  2011     ?  Ill-defined condition  09/15/2015          FX R HAND         ?  Neurocutaneous syndrome (HCC)       ?  Other and unspecified hyperlipidemia  12/27/2012     ?  PE (pulmonary embolism)  12/2012     ?  RLS (restless legs syndrome)  03/16/2016          Dr Scarlett Presto         ?  TIA (transient ischemic attack)  03/2016          Right facial droop, numbness in lip         ?  Torn rotator cuff            right           Past Surgical History:     Past Surgical History:         Procedure  Laterality  Date          ?  HX APPENDECTOMY         ?  HX CESAREAN SECTION              x4          ?  HX GI              COLONOSCOPY          ?  HX KNEE REPLACEMENT  Left  10/29/2015     ?  HX ORTHOPAEDIC    08/2004          rotater cuff r. shoulder          ?  HX ORTHOPAEDIC              left ankle fracture treated with air cast          ?  HX ORTHOPAEDIC    6/13          left shoulder fxt  and dislocation          ?  HX OTHER SURGICAL    2010          left foot torn tendon          ?  HX TUBAL LIGATION              ?  IMPLANT BREAST SILICONE/EQ    2002          Bilateral breast implants          ?  PR BREAST SURGERY PROCEDURE UNLISTED    2002          AUGMENTATION           Family History:     Family History         Problem  Relation  Age of Onset          ?  Heart Disease  Mother       ?  Heart Disease  Father       ?  Cancer  Father                thyroid cancer          ?  Arthritis-rheumatoid  Sister       ?  No Known Problems  Son       ?  No Known Problems  Daughter       ?  No Known Problems  Daughter       ?  No Known Problems  Daughter            ?  Anesth Problems  Neg Hx             Social History:     Social History          Tobacco Use         ?  Smoking status:  Never Smoker     ?  Smokeless tobacco:  Never Used       Substance Use Topics         ?  Alcohol use:  No             Comment: Pt denies         ?  Drug use:  No           Allergies:   No Known Allergies           Review of Systems     Review of Systems    Constitutional: Negative for chills and fever.    HENT: Negative for congestion and rhinorrhea.     Eyes: Negative for visual disturbance.    Respiratory: Negative for chest tightness and shortness of breath.     Gastrointestinal: Negative for abdominal pain, diarrhea, nausea and vomiting.    Genitourinary: Negative for dysuria, flank pain and hematuria.    Musculoskeletal: Positive for back pain. Negative for neck pain.    Skin: Negative for rash.    Allergic/Immunologic: Negative for immunocompromised state.    Neurological: Negative for dizziness, speech difficulty, weakness and headaches.    Hematological: Negative for adenopathy.    Psychiatric/Behavioral: Negative for dysphoric mood and suicidal ideas.            Physical Exam     Physical Exam   Vitals and nursing note reviewed.   Constitutional:        General: She is not in acute distress.     Appearance: She is not  ill-appearing or toxic-appearing.    HENT:       Head: Normocephalic and atraumatic.      Nose: Nose normal.      Mouth/Throat:      Mouth: Mucous membranes are moist.  Eyes:       Extraocular Movements: Extraocular movements intact.      Pupils: Pupils are equal, round, and reactive to light.   Cardiovascular:       Rate and Rhythm: Normal rate and regular rhythm.      Pulses: Normal pulses.    Pulmonary:       Effort: Pulmonary effort is normal.      Breath sounds: No stridor. No wheezing or rhonchi.   Abdominal:      General: Abdomen is flat. There is  no distension.      Tenderness: There is no abdominal tenderness.     Musculoskeletal:          General: Normal range of motion.      Cervical back: Normal range of motion and neck supple.      Lumbar back: Negative right straight leg raise test and negative left straight leg raise test.        Back:       Skin:      General: Skin is warm and dry.   Neurological :       General: No focal deficit present.      Mental Status: She is alert and oriented to person, place, and time.    Psychiatric:         Judgment: Judgment normal.               Diagnostic Study Results        Labs -    No results found for this or any previous visit (from the past 24 hour(s)).      Radiologic Studies -      CT SPINE LUMB WO CONT       Final Result     1. Acute compression fracture at T12, with mild retropulsion of bone but no     significant compression of the conus medullaris or cauda is obvious. No     significant associated hematoma, epidural or paraspinal.      2. Nonacute superior endplate depression at L4.     3. Multilevel degenerative change.                      CT Results   (Last 48 hours)                                    12/06/19 2118    CT SPINE LUMB WO CONT  Final result            Impression:    1. Acute compression fracture at T12, with mild retropulsion of bone but no      significant compression of the conus medullaris or cauda is obvious. No      significant  associated hematoma, epidural or paraspinal.       2. Nonacute superior endplate depression at L4.      3. Multilevel degenerative change.                              Narrative:    ADDITIONAL INDICATION:  fall onto back, now with back pain. Fell from a step      ladder, now with back and left rib pain.             COMPARISON: Lumbar spine July 2018.  TECHNIQUE: Multislice helical CT of the lumbar spine was performed. Sagittal and      coronal reformations were generated. CT dose reduction was achieved through use      of a standardized protocol tailored for this examination and automatic exposure      control for dose modulation.              FINDINGS:      The alignment of the lumbar spine is normal. T12 vertebral body height is lost      by one third, with superior endplate depression and acute fracture. There is      mild retropulsion of bone toward the thecal sac, but without significant      compression of the conus medullaris obvious and without extraosseous extension      or hematoma in the paraspinal soft tissues. The superior endplate of L4 shows      mild depression, not acute. The vertebral body heights and disc spaces are      otherwise well-preserved.             The findings at each level are as follows:              Multilevel degenerative change, including mild central stenosis at L3-4 and      L4-5.                                  CXR Results   (Last 48 hours)          None                    Medical Decision Making     I am the first provider for this patient.      I reviewed the vital signs, available nursing notes, past medical history, past surgical history, family history and social history.      Vital Signs-Reviewed the patient's vital signs.   Patient Vitals for the past 24 hrs:            Temp  Pulse  Resp  BP  SpO2            12/06/19 2012  98 ??F (36.7 ??C)  68  16  122/75  95 %        Records Reviewed: Nursing Notes and Old Medical Records      Provider Notes (Medical Decision  Making):    Vitals are stable.  CT of lumbar spine reveal acute compression fracture at T12, with mild retropulsion of bone but no significant compression of the conus medullaris or cauda.  No significant associated  hematoma, epidural or paraspinal.  Clinically, I have no suspicion for conus medullaris or cauda equina as patient is neurovascularly intact in bilateral lower extremities, with full range of motion, no saddle anesthesia, etc.  CT also reveal nonacute  superior endplate depression at L4.  As well as multilevel degenerative changes.  Patient was treated with analgesic and muscle relaxation as well as NSAID medications in the ED.  Results of her imaging study was discussed with her as well as her husband  at bedside.  On my repeat evaluation, patient is endorsing significant improvement in her symptoms.  She feels comfortable with plan for discharge.  She is advised to follow-up with spine surgery, and provided with referral information.  Rx sent for short  course of oxycodone, along with Flexeril, prednisone, as well  as lidocaine patch.  Return precautions discussed.                   Mellody Drown, MD         Disposition:   Discharge         DISCHARGE PLAN:   1.      Discharge Medication List as of 12/06/2019 10:54 PM              START taking these medications          Details        oxyCODONE IR (Roxicodone) 5 mg immediate release tablet  Take 1 Tablet by mouth every six (6) hours as needed for Pain for up to 5 days. Max Daily Amount: 20 mg., Normal, Disp-20 Tablet, R-0               cyclobenzaprine (FLEXERIL) 10 mg tablet  Take 1 Tablet by mouth three (3) times daily as needed for Muscle Spasm(s)., Normal, Disp-21 Tablet, R-0               predniSONE (DELTASONE) 20 mg tablet  Take 40 mg by mouth daily for 5 days. With Breakfast, Normal, Disp-10 Tablet, R-0               lidocaine 4 % patch  1 Patch by TransDERmal route every twelve (12) hours every twelve (12) hours., Normal, Disp-5 Patch, R-2                      CONTINUE these medications which have NOT CHANGED          Details        atorvastatin (LIPITOR) 10 mg tablet  Take 1 Tab by mouth daily., Normal, Disp-30 Tab, R-3               gabapentin enacarbil (Horizant) 600 mg TbER  Take 600 mg by mouth two (2) times a day. Max Daily Amount: 1,200 mg., Normal, Disp-180 Tab, R-0               rOPINIRole (REQUIP) 1 mg tablet  TAKE 1 TABLET AT 1-2PM AND TAKE 3 TABLETS BY MOUTH 1 TO 3 HOURS BEFORE BEDTIME EVERYDAY, Normal, Disp-360 Tab, R-0               diazePAM (VALIUM) 5 mg tablet  diazepam 5 mg tablet, Historical Med               ibuprofen (MOTRIN) 600 mg tablet  Take 1 Tab by mouth every six (6) hours as needed for Pain., Print, Disp-20 Tab, R-0               buPROPion XL (WELLBUTRIN XL) 150 mg tablet  150 mg two (2) times a day. Take 1 tab BID, Historical Med, R-0               ergocalciferol (ERGOCALCIFEROL) 50,000 unit capsule  Take 1 Cap by mouth every seven (7) days., Normal, Disp-12 Cap, R-1               escitalopram oxalate (LEXAPRO) 20 mg tablet  Take 1 Tab by mouth daily., Normal, Disp-30 Tab, R-5               omeprazole (PRILOSEC) 20 mg capsule  Take 20 mg by mouth nightly., Historical Med               aspirin 81 mg chewable tablet  Take  1 Tab by mouth daily., Print, Disp-30 Tab, R-0                      2.      Follow-up Information               Follow up With  Specialties  Details  Why  Contact Info              Job Founds, MD  Orthopedic Surgery  In 1 week    [address]   [address]   [place] [address]   [phone]                 Clarene Critchley, MD  Internal Medicine  In 3 days    [address] [facility]   [address]   [place] [address]   [phone]                 [facility] EM[facility]  Emergency Medicine    As needed, If symptoms worsen  [address]   [place] [address]   [phone]             3.  Return to ED if worse         Diagnosis        Clinical Impression:       1.  Compression fracture  of T12 vertebra, initial encounter [facility])            Attestations:      Mellody Drown, MD      Please note that this dictation was completed with Dragon, the computer voice recognition software.  Quite often unanticipated grammatical, syntax, homophones, and other interpretive errors are  inadvertently transcribed by the computer software.  Please disregard these errors.  Please excuse any errors that have escaped final proofreading.  Thank you.

## 2019-12-06 NOTE — ED Notes (Signed)
 Zoe Lan MD reviewed discharge instructions with the patient.  The patient verbalized understanding.  All questions and concerns were addressed.  The patient declined a wheelchair and is discharged ambulatory in the care of family members with instructions and prescriptions in hand.  Pt is alert and oriented x 4.  Respirations are clear and unlabored.

## 2019-12-06 NOTE — ED Notes (Signed)
Pt arrives ems from home.Per EMS, pt was stepping up on a step ladder and fell down and hit her lower back. Per pt, pain is 8/10 in her lower back and soreness reported in her left rib side.     Pt denies cp, sob, LOC, ext numbness or loss of sensation, blood thinner, or hitting her head.     Pt is a/o x4, resting in bed next to nurses station.     2100: pt in ct at this time.

## 2019-12-06 NOTE — ED Notes (Addendum)
Pt arrives ems from home.Per EMS, pt was stepping up on a step ladder and fell down and hit her lower back. Per pt, pain is 8/10 in her lower back and soreness reported in her left rib side.     Pt denies cp, sob, LOC, ext numbness or loss of sensation, blood thinner, or hitting her head.     Pt is a/o x4, resting in bed next to nurses station.     2100: pt in ct at this time.

## 2019-12-06 NOTE — ED Provider Notes (Signed)
EMERGENCY DEPARTMENT HISTORY AND PHYSICAL EXAM      Date: 12/06/2019  Patient Name: Lindsay [name]    History of Presenting Illness     Chief Complaint   Patient presents with   ??? Back Pain   ??? Fall       History Provided By: Patient    HPI: Lindsay [name], 64 y.o. female presents to the ED with cc of midline low back pain status post mechanical fall.  Patient reports that she was coming down a step ladder to change the batteries on a device when she fell down directly onto her low back. States that she did not hit her head or lose consciousness.  Reports that she did not injure herself elsewhere other than her mid back.  States that she was able to move her legs bilaterally immediately after the incident, and has no loss of sensation.  She denies any saddle anesthesia.  No radiation of pain into bilateral lower extremities.  States that there is pain present at rest, but pain is exacerbated further by movement.  States that she was able to stand after the injury, but any movement worsens the pain, and thus was brought in for evaluation by EMS.    PCP: Clarene Critchley, MD    No current facility-administered medications on file prior to encounter.     Current Outpatient Medications on File Prior to Encounter   Medication Sig Dispense Refill   ??? atorvastatin (LIPITOR) 10 mg tablet Take 1 Tab by mouth daily. 30 Tab 3   ??? gabapentin enacarbil (Horizant) 600 mg TbER Take 600 mg by mouth two (2) times a day. Max Daily Amount: 1,200 mg. 180 Tab 0   ??? rOPINIRole (REQUIP) 1 mg tablet TAKE 1 TABLET AT 1-2PM AND TAKE 3 TABLETS BY MOUTH 1 TO 3 HOURS BEFORE BEDTIME EVERYDAY 360 Tab 0   ??? diazePAM (VALIUM) 5 mg tablet diazepam 5 mg tablet     ??? ibuprofen (MOTRIN) 600 mg tablet Take 1 Tab by mouth every six (6) hours as needed for Pain. 20 Tab 0   ??? buPROPion XL (WELLBUTRIN XL) 150 mg tablet 150 mg two (2) times a day. Take 1 tab BID  0   ??? ergocalciferol (ERGOCALCIFEROL) 50,000 unit capsule Take 1 Cap by mouth every seven (7)  days. 12 Cap 1   ??? escitalopram oxalate (LEXAPRO) 20 mg tablet Take 1 Tab by mouth daily. 30 Tab 5   ??? omeprazole (PRILOSEC) 20 mg capsule Take 20 mg by mouth nightly.     ??? aspirin 81 mg chewable tablet Take 1 Tab by mouth daily. (Patient not taking: Reported on 12/06/2019) 30 Tab 0       Past History     Past Medical History:  Past Medical History:   Diagnosis Date   ??? Abnormal brain scan    ??? Anxiety disorder    ??? Arthritis    ??? Chronic pain    ??? Depression    ??? DVT (deep venous thrombosis) (HCC)    ??? Falls frequently    ??? Fracture of left ankle    ??? Headache(784.0) 2011   ??? Ill-defined condition 09/15/2015    FX R HAND   ??? 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 Other and unspecified hyperlipidemia 12/27/2012   ??? PE (pulmonary embolism) 12/2012   ??? RLS (restless legs syndrome) 03/16/2016    Dr Scarlett Presto   ??? TIA (transient ischemic attack) 03/2016  Right facial droop, numbness in lip   ??? Torn rotator cuff     right       Past Surgical History:  Past Surgical History:   Procedure Laterality Date   ??? HX APPENDECTOMY     ??? HX CESAREAN SECTION      x4   ??? HX GI      COLONOSCOPY   ??? 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 HX ORTHOPAEDIC  08/2004    rotater cuff r. shoulder   ??? HX ORTHOPAEDIC      left ankle fracture treated with air cast   ??? HX ORTHOPAEDIC  6/13    left shoulder fxt and dislocation   ??? HX OTHER SURGICAL  2010    left foot torn tendon   ??? HX TUBAL LIGATION     ??? IMPLANT BREAST SILICONE/EQ  1191    Bilateral breast implants   ??? PR BREAST SURGERY PROCEDURE UNLISTED  2002    AUGMENTATION       Family History:  Family History   Problem Relation Age of Onset   ??? Heart Disease Mother    ??? Heart Disease Father    ??? Cancer Father         thyroid cancer   ??? Arthritis-rheumatoid Sister    ??? No Known Problems Son    ??? No Known Problems Daughter    ??? No Known Problems Daughter    ??? No Known Problems Daughter    ??? Anesth Problems Neg Hx        Social History:  Social History     Tobacco Use   ??? Smoking status: Never  Smoker   ??? Smokeless tobacco: Never Used   Substance Use Topics   ??? Alcohol use: No     Comment: Pt denies   ??? Drug use: No       Allergies:  No Known Allergies      Review of Systems   Review of Systems   Constitutional: Negative for chills and fever.   HENT: Negative for congestion and rhinorrhea.    Eyes: Negative for visual disturbance.   Respiratory: Negative for chest tightness and shortness of breath.    Gastrointestinal: Negative for abdominal pain, diarrhea, nausea and vomiting.   Genitourinary: Negative for dysuria, flank pain and hematuria.   Musculoskeletal: Positive for back pain. Negative for neck pain.   Skin: Negative for rash.   Allergic/Immunologic: Negative for immunocompromised state.   Neurological: Negative for dizziness, speech difficulty, weakness and headaches.   Hematological: Negative for adenopathy.   Psychiatric/Behavioral: Negative for dysphoric mood and suicidal ideas.       Physical Exam   Physical Exam  Vitals and nursing note reviewed.   Constitutional:       General: She is not in acute distress.     Appearance: She is not ill-appearing or toxic-appearing.   HENT:      Head: Normocephalic and atraumatic.      Nose: Nose normal.      Mouth/Throat:      Mouth: Mucous membranes are moist.   Eyes:      Extraocular Movements: Extraocular movements intact.      Pupils: Pupils are equal, round, and reactive to light.   Cardiovascular:      Rate and Rhythm: Normal rate and regular rhythm.      Pulses: Normal pulses.   Pulmonary:      Effort: Pulmonary effort is normal.  Breath sounds: No stridor. No wheezing or rhonchi.   Abdominal:      General: Abdomen is flat. There is no distension.      Tenderness: There is no abdominal tenderness.   Musculoskeletal:         General: Normal range of motion.      Cervical back: Normal range of motion and neck supple.      Lumbar back: Negative right straight leg raise test and negative left straight leg raise test.        Back:    Skin:      General: Skin is warm and dry.   Neurological:      General: No focal deficit present.      Mental Status: She is alert and oriented to person, place, and time.   Psychiatric:         Judgment: Judgment normal.         Diagnostic Study Results     Labs -   No results found for this or any previous visit (from the past 24 hour(s)).    Radiologic Studies -   CT SPINE LUMB WO CONT   Final Result   1. Acute compression fracture at T12, with mild retropulsion of bone but no   significant compression of the conus medullaris or cauda is obvious. No   significant associated hematoma, epidural or paraspinal.    2. Nonacute superior endplate depression at L4.   3. Multilevel degenerative change.           CT Results  (Last 48 hours)               12/06/19 2118  CT SPINE LUMB WO CONT Final result    Impression:  1. Acute compression fracture at T12, with mild retropulsion of bone but no   significant compression of the conus medullaris or cauda is obvious. No   significant associated hematoma, epidural or paraspinal.    2. Nonacute superior endplate depression at L4.   3. Multilevel degenerative change.           Narrative:  ADDITIONAL INDICATION:  fall onto back, now with back pain. Fell from a step   ladder, now with back and left rib pain.       COMPARISON: Lumbar spine July 2018.       TECHNIQUE: Multislice helical CT of the lumbar spine was performed. Sagittal and   coronal reformations were generated. CT dose reduction was achieved through use   of a standardized protocol tailored for this examination and automatic exposure   control for dose modulation.        FINDINGS:   The alignment of the lumbar spine is normal. T12 vertebral body height is lost   by one third, with superior endplate depression and acute fracture. There is   mild retropulsion of bone toward the thecal sac, but without significant   compression of the conus medullaris obvious and without extraosseous extension   or hematoma in the paraspinal soft  tissues. The superior endplate of L4 shows   mild depression, not acute. The vertebral body heights and disc spaces are   otherwise well-preserved.       The findings at each level are as follows:        Multilevel degenerative change, including mild central stenosis at L3-4 and   L4-5.                CXR Results  (Last 48 hours)  None          Medical Decision Making   I am the first provider for this patient.    I reviewed the vital signs, available nursing notes, past medical history, past surgical history, family history and social history.    Vital Signs-Reviewed the patient's vital signs.  Patient Vitals for the past 24 hrs:   Temp Pulse Resp BP SpO2   12/06/19 2012 98 ??F (36.7 ??C) 68 16 122/75 95 %     Records Reviewed: Nursing Notes and Old Medical Records    Provider Notes (Medical Decision Making):   Vitals are stable.  CT of lumbar spine reveal acute compression fracture at T12, with mild retropulsion of bone but no significant compression of the conus medullaris or cauda.  No significant associated hematoma, epidural or paraspinal.  Clinically, I have no suspicion for conus medullaris or cauda equina as patient is neurovascularly intact in bilateral lower extremities, with full range of motion, no saddle anesthesia, etc.  CT also reveal nonacute superior endplate depression at L4.  As well as multilevel degenerative changes.  Patient was treated with analgesic and muscle relaxation as well as NSAID medications in the ED.  Results of her imaging study was discussed with her as well as her husband at bedside.  On my repeat evaluation, patient is endorsing significant improvement in her symptoms.  She feels comfortable with plan for discharge.  She is advised to follow-up with spine surgery, and provided with referral information.  Rx sent for short course of oxycodone, along with Flexeril, prednisone, as well as lidocaine patch.  Return precautions discussed.             Mellody Drown, MD      Disposition:   Discharge      DISCHARGE PLAN:  1.   Discharge Medication List as of 12/06/2019 10:54 PM      START taking these medications    Details   oxyCODONE IR (Roxicodone) 5 mg immediate release tablet Take 1 Tablet by mouth every six (6) hours as needed for Pain for up to 5 days. Max Daily Amount: 20 mg., Normal, Disp-20 Tablet, R-0      cyclobenzaprine (FLEXERIL) 10 mg tablet Take 1 Tablet by mouth three (3) times daily as needed for Muscle Spasm(s)., Normal, Disp-21 Tablet, R-0      predniSONE (DELTASONE) 20 mg tablet Take 40 mg by mouth daily for 5 days. With Breakfast, Normal, Disp-10 Tablet, R-0      lidocaine 4 % patch 1 Patch by TransDERmal route every twelve (12) hours every twelve (12) hours., Normal, Disp-5 Patch, R-2         CONTINUE these medications which have NOT CHANGED    Details   atorvastatin (LIPITOR) 10 mg tablet Take 1 Tab by mouth daily., Normal, Disp-30 Tab, R-3      gabapentin enacarbil (Horizant) 600 mg TbER Take 600 mg by mouth two (2) times a day. Max Daily Amount: 1,200 mg., Normal, Disp-180 Tab, R-0      rOPINIRole (REQUIP) 1 mg tablet TAKE 1 TABLET AT 1-2PM AND TAKE 3 TABLETS BY MOUTH 1 TO 3 HOURS BEFORE BEDTIME EVERYDAY, Normal, Disp-360 Tab, R-0      diazePAM (VALIUM) 5 mg tablet diazepam 5 mg tablet, Historical Med      ibuprofen (MOTRIN) 600 mg tablet Take 1 Tab by mouth every six (6) hours as needed for Pain., Print, Disp-20 Tab, R-0      buPROPion XL (WELLBUTRIN XL)  150 mg tablet 150 mg two (2) times a day. Take 1 tab BID, Historical Med, R-0      ergocalciferol (ERGOCALCIFEROL) 50,000 unit capsule Take 1 Cap by mouth every seven (7) days., Normal, Disp-12 Cap, R-1      escitalopram oxalate (LEXAPRO) 20 mg tablet Take 1 Tab by mouth daily., Normal, Disp-30 Tab, R-5      omeprazole (PRILOSEC) 20 mg capsule Take 20 mg by mouth nightly., Historical Med      aspirin 81 mg chewable tablet Take 1 Tab by mouth daily., Print, Disp-30 Tab, R-0           2.   Follow-up Information     Follow up  With Specialties Details Why Contact Info    Job Founds, MD Orthopedic Surgery In 1 week  [address]  [address]  [place] [address]  [phone]      Clarene Critchley, MD Internal Medicine In 3 days  [address] [facility] [place]  [address]  [place] [address]  [phone]      [facility] EMERGENCY DEPT Emergency Medicine  As needed, If symptoms worsen [address]  [place] [address]  [phone]        3.  Return to ED if worse     Diagnosis     Clinical Impression:   1. Compression fracture of T12 vertebra, initial encounter [facility])        Attestations:    Mellody Drown, MD    Please note that this dictation was completed with Dragon, the computer voice recognition software.  Quite often unanticipated grammatical, syntax, homophones, and other interpretive errors are inadvertently transcribed by the computer software.  Please disregard these errors.  Please excuse any errors that have escaped final proofreading.  Thank you.

## 2019-12-06 NOTE — ED Notes (Signed)
Miao MD reviewed discharge instructions with the patient.  The patient verbalized understanding.  All questions and concerns were addressed.  The patient declined a wheelchair and is discharged ambulatory in the care of family members with instructions and prescriptions in hand.  Pt is alert and oriented x 4.  Respirations are clear and unlabored.

## 2019-12-07 MED ORDER — PREDNISONE 20 MG TAB
20 mg | ORAL_TABLET | Freq: Every day | ORAL | 0 refills | Status: DC
Start: 2019-12-07 — End: 2019-12-13

## 2019-12-07 MED ORDER — OXYCODONE 5 MG TAB
5 mg | ORAL_TABLET | Freq: Four times a day (QID) | ORAL | 0 refills | Status: DC | PRN
Start: 2019-12-07 — End: 2019-12-13

## 2019-12-07 MED ORDER — CYCLOBENZAPRINE 10 MG TAB
10 mg | ORAL_TABLET | Freq: Three times a day (TID) | ORAL | 0 refills | Status: DC | PRN
Start: 2019-12-07 — End: 2019-12-16

## 2019-12-07 MED ORDER — LIDOCAINE 4 % TOPICAL PATCH (12 HOUR DURATION)
4 % | MEDICATED_PATCH | Freq: Two times a day (BID) | CUTANEOUS | 2 refills | Status: DC
Start: 2019-12-07 — End: 2019-12-20

## 2019-12-07 MED ORDER — DIAZEPAM 5 MG/ML SYRINGE
5 mg/mL | INTRAMUSCULAR | Status: AC
Start: 2019-12-07 — End: 2019-12-06
  Administered 2019-12-07: 01:00:00 via INTRAVENOUS

## 2019-12-07 MED ORDER — CYCLOBENZAPRINE 10 MG TAB
10 mg | ORAL_TABLET | Freq: Three times a day (TID) | ORAL | 0 refills | Status: DC | PRN
Start: 2019-12-07 — End: 2019-12-11

## 2019-12-07 MED ORDER — MORPHINE 2 MG/ML INJECTION
2 mg/mL | INTRAMUSCULAR | Status: AC
Start: 2019-12-07 — End: 2019-12-06
  Administered 2019-12-07: 01:00:00 via INTRAVENOUS

## 2019-12-07 MED ORDER — KETOROLAC TROMETHAMINE 30 MG/ML INJECTION
30 mg/mL (1 mL) | INTRAMUSCULAR | Status: AC
Start: 2019-12-07 — End: 2019-12-06
  Administered 2019-12-07: 01:00:00 via INTRAVENOUS

## 2019-12-07 MED FILL — KETOROLAC TROMETHAMINE 30 MG/ML INJECTION: 30 mg/mL (1 mL) | INTRAMUSCULAR | Qty: 1

## 2019-12-07 MED FILL — DIAZEPAM 5 MG/ML SYRINGE: 5 mg/mL | INTRAMUSCULAR | Qty: 2

## 2019-12-07 MED FILL — MORPHINE 2 MG/ML INJECTION: 2 mg/mL | INTRAMUSCULAR | Qty: 2

## 2019-12-07 NOTE — Telephone Encounter (Signed)
Looks like pt was given a 5 day supply of meds to get her thru the weekend.

## 2019-12-07 NOTE — Telephone Encounter (Signed)
VM left for pt advising of flexeril rx and to call office to schedule ER follow up for next week.

## 2019-12-07 NOTE — Telephone Encounter (Signed)
Per patient she was at the ER yesterday due to falling off a stool and hurting her back , states she was taken by ambulance to the ER . Per patient she was given pain meds , muscle relaxers and a patch  But was given a very very limited supply, states the meds are wearing off and she's in a ton of pain ,  ,      Hoping PCP can do something to get her through the weekend      She can be reached at [phone]

## 2019-12-07 NOTE — Telephone Encounter (Signed)
I sent in flexeril to use prn but would  Need to see her in office for any  Pain  meds please let her know thanks

## 2019-12-07 NOTE — Telephone Encounter (Signed)
Per patient she was at the ER yesterday due to falling off a stool and hurting her back , states she was taken by ambulance to the ER . Per patient she was given pain meds , muscle relaxers and a patch  But was given a very very limited supply, states the meds are wearing off and she's in a ton of pain ,  ,      Hoping PCP can do something to get her through the weekend      She can be reached at [phone]

## 2019-12-07 NOTE — Telephone Encounter (Signed)
VM left for pt advising of flexeril rx and to call office to schedule ER follow up for next week.

## 2019-12-07 NOTE — Telephone Encounter (Signed)
Looks like pt was given a 5 day supply of meds to get her thru the weekend.

## 2019-12-07 NOTE — Telephone Encounter (Signed)
I sent in flexeril to use prn but would  Need to see her in office for any  Pain  meds please let her know thanks

## 2019-12-10 NOTE — Telephone Encounter (Signed)
Patient made aware her appt needs to be in-person in order for PCP to write for pain meds. Patient agreed to an appt tomorrow in-office at 1:00. Patient was thankful for the sooner appt.

## 2019-12-10 NOTE — Telephone Encounter (Signed)
-----   Message from Joanell Rising sent at 12/10/2019 12:51 PM EDT -----  Regarding: telephone  General Message/Vendor Calls    Caller's first and last name: PT       Reason for call: The PT fell and has a broken compressed fracture of c-12 vertibrae, Broken .. ran out of pain meds and needs a referal to a specialist has no more pain meds wants to know if the doctor can call her before the VV on 12/12/2019 and if she can get anything for pain       Callback required yes/no and why: yes      Best contact number(s): [phone]      Details to clarify the request: n/a       Joanell Rising

## 2019-12-11 ENCOUNTER — Emergency Department: Admit: 2019-12-11 | Payer: MEDICARE | Primary: Internal Medicine

## 2019-12-11 ENCOUNTER — Inpatient Hospital Stay
Admit: 2019-12-11 | Discharge: 2019-12-13 | Disposition: A | Discharge status: Home / Self Care | Payer: MEDICARE | Attending: Internal Medicine | Admitting: Internal Medicine

## 2019-12-11 ENCOUNTER — Encounter: Payer: MEDICARE | Attending: Internal Medicine | Primary: Internal Medicine

## 2019-12-11 DIAGNOSIS — S22080A Wedge compression fracture of T11-T12 vertebra, initial encounter for closed fracture: Secondary | ICD-10-CM

## 2019-12-11 LAB — URINALYSIS W/ REFLEX CULTURE
BACTERIA, URINE: NEGATIVE /hpf
Bacteria: NEGATIVE /hpf
Bilirubin, Urine: NEGATIVE
Bilirubin: NEGATIVE
Blood, Urine: NEGATIVE
Blood: NEGATIVE
Glucose, Ur: NEGATIVE mg/dL
Glucose: NEGATIVE mg/dL
Ketone: NEGATIVE mg/dL
Ketones, Urine: NEGATIVE mg/dL
Leukocyte Esterase, Urine: NEGATIVE
Leukocyte Esterase: NEGATIVE
Nitrite, Urine: NEGATIVE
Nitrites: NEGATIVE
Protein, UA: NEGATIVE mg/dL
Protein: NEGATIVE mg/dL
Specific Gravity, UA: 1.005 (ref 1.003–1.030)
Specific gravity: 1.005 (ref 1.003–1.030)
Urobilinogen, UA, POCT: 0.2 EU/dL (ref 0.2–1.0)
Urobilinogen: 0.2 EU/dL (ref 0.2–1.0)
pH (UA): 6.5 (ref 5.0–8.0)
pH, UA: 6.5 (ref 5.0–8.0)

## 2019-12-11 LAB — CBC WITH AUTO DIFFERENTIAL
Basophils %: 0 % (ref 0–1)
Basophils Absolute: 0.1 10*3/uL (ref 0.0–0.1)
Eosinophils %: 4 % (ref 0–7)
Eosinophils Absolute: 0.5 10*3/uL — ABNORMAL HIGH (ref 0.0–0.4)
Granulocyte Absolute Count: 0.1 10*3/uL — ABNORMAL HIGH (ref 0.00–0.04)
Hematocrit: 32.5 % — ABNORMAL LOW (ref 35.0–47.0)
Hemoglobin: 10.1 g/dL — ABNORMAL LOW (ref 11.5–16.0)
Immature Granulocytes: 1 % — ABNORMAL HIGH (ref 0.0–0.5)
Lymphocytes %: 29 % (ref 12–49)
Lymphocytes Absolute: 3.3 10*3/uL (ref 0.8–3.5)
MCH: 25.8 PG — ABNORMAL LOW (ref 26.0–34.0)
MCHC: 31.1 g/dL (ref 30.0–36.5)
MCV: 82.9 FL (ref 80.0–99.0)
MPV: 9.3 FL (ref 8.9–12.9)
Monocytes %: 9 % (ref 5–13)
Monocytes Absolute: 1.1 10*3/uL — ABNORMAL HIGH (ref 0.0–1.0)
NRBC Absolute: 0 10*3/uL (ref 0.00–0.01)
Neutrophils %: 57 % (ref 32–75)
Neutrophils Absolute: 6.5 10*3/uL (ref 1.8–8.0)
Nucleated RBCs: 0 PER 100 WBC
Platelets: 236 10*3/uL (ref 150–400)
RBC: 3.92 M/uL (ref 3.80–5.20)
RDW: 14.3 % (ref 11.5–14.5)
WBC: 11.5 10*3/uL — ABNORMAL HIGH (ref 3.6–11.0)

## 2019-12-11 LAB — COMPREHENSIVE METABOLIC PANEL
ALT: 25 U/L (ref 12–78)
AST: 16 U/L (ref 15–37)
Albumin/Globulin Ratio: 1.1 (ref 1.1–2.2)
Albumin: 3.6 g/dL (ref 3.5–5.0)
Alkaline Phosphatase: 98 U/L (ref 45–117)
Anion Gap: 3 mmol/L — ABNORMAL LOW (ref 5–15)
BUN: 23 MG/DL — ABNORMAL HIGH (ref 6–20)
Bun/Cre Ratio: 31 — ABNORMAL HIGH (ref 12–20)
CO2: 29 mmol/L (ref 21–32)
Calcium: 9.4 MG/DL (ref 8.5–10.1)
Chloride: 104 mmol/L (ref 97–108)
Creatinine: 0.74 MG/DL (ref 0.55–1.02)
EGFR IF NonAfrican American: >60 mL/min/{1.73_m2} (ref >60)
GFR African American: >60 mL/min/{1.73_m2} (ref >60)
Globulin: 3.4 g/dL (ref 2.0–4.0)
Glucose: 85 mg/dL (ref 65–100)
Potassium: 4.1 mmol/L (ref 3.5–5.1)
Sodium: 136 mmol/L (ref 136–145)
Total Bilirubin: 0.4 MG/DL (ref 0.2–1.0)
Total Protein: 7 g/dL (ref 6.4–8.2)

## 2019-12-11 LAB — METABOLIC PANEL, COMPREHENSIVE
A-G Ratio: 1.1 (ref 1.1–2.2)
ALT (SGPT): 25 U/L (ref 12–78)
AST (SGOT): 16 U/L (ref 15–37)
Albumin: 3.6 g/dL (ref 3.5–5.0)
Alk. phosphatase: 98 U/L (ref 45–117)
Anion gap: 3 mmol/L — ABNORMAL LOW (ref 5–15)
BUN/Creatinine ratio: 31 — ABNORMAL HIGH (ref 12–20)
BUN: 23 MG/DL — ABNORMAL HIGH (ref 6–20)
Bilirubin, total: 0.4 MG/DL (ref 0.2–1.0)
CO2: 29 mmol/L (ref 21–32)
Calcium: 9.4 MG/DL (ref 8.5–10.1)
Chloride: 104 mmol/L (ref 97–108)
Creatinine: 0.74 MG/DL (ref 0.55–1.02)
GFR est AA: >60 mL/min/{1.73_m2} (ref >60)
GFR est non-AA: >60 mL/min/{1.73_m2} (ref >60)
Globulin: 3.4 g/dL (ref 2.0–4.0)
Glucose: 85 mg/dL (ref 65–100)
Potassium: 4.1 mmol/L (ref 3.5–5.1)
Protein, total: 7 g/dL (ref 6.4–8.2)
Sodium: 136 mmol/L (ref 136–145)

## 2019-12-11 LAB — CBC WITH AUTOMATED DIFF
ABS. BASOPHILS: 0.1 10*3/uL (ref 0.0–0.1)
ABS. EOSINOPHILS: 0.5 10*3/uL — ABNORMAL HIGH (ref 0.0–0.4)
ABS. IMM. GRANS.: 0.1 10*3/uL — ABNORMAL HIGH (ref 0.00–0.04)
ABS. LYMPHOCYTES: 3.3 10*3/uL (ref 0.8–3.5)
ABS. MONOCYTES: 1.1 10*3/uL — ABNORMAL HIGH (ref 0.0–1.0)
ABS. NEUTROPHILS: 6.5 10*3/uL (ref 1.8–8.0)
ABSOLUTE NRBC: 0 10*3/uL (ref 0.00–0.01)
BASOPHILS: 0 % (ref 0–1)
EOSINOPHILS: 4 % (ref 0–7)
HCT: 32.5 % — ABNORMAL LOW (ref 35.0–47.0)
HGB: 10.1 g/dL — ABNORMAL LOW (ref 11.5–16.0)
IMMATURE GRANULOCYTES: 1 % — ABNORMAL HIGH (ref 0.0–0.5)
LYMPHOCYTES: 29 % (ref 12–49)
MCH: 25.8 PG — ABNORMAL LOW (ref 26.0–34.0)
MCHC: 31.1 g/dL (ref 30.0–36.5)
MCV: 82.9 FL (ref 80.0–99.0)
MONOCYTES: 9 % (ref 5–13)
MPV: 9.3 FL (ref 8.9–12.9)
NEUTROPHILS: 57 % (ref 32–75)
NRBC: 0 PER 100 WBC
PLATELET: 236 10*3/uL (ref 150–400)
RBC: 3.92 M/uL (ref 3.80–5.20)
RDW: 14.3 % (ref 11.5–14.5)
WBC: 11.5 10*3/uL — ABNORMAL HIGH (ref 3.6–11.0)

## 2019-12-11 MED ORDER — POLYETHYLENE GLYCOL 3350 17 GRAM (100 %) ORAL POWDER PACKET
17 gram | Freq: Every day | ORAL | Status: DC | PRN
Start: 2019-12-11 — End: 2019-12-13

## 2019-12-11 MED ORDER — ROPINIROLE 1 MG TAB
1 mg | ORAL | Status: DC
Start: 2019-12-11 — End: 2019-12-13
  Administered 2019-12-12: 16:00:00 via ORAL

## 2019-12-11 MED ORDER — MORPHINE 2 MG/ML INJECTION
2 mg/mL | INTRAMUSCULAR | Status: AC
Start: 2019-12-11 — End: 2019-12-11
  Administered 2019-12-11: 16:00:00 via INTRAVENOUS

## 2019-12-11 MED ORDER — FENTANYL CITRATE (PF) 50 MCG/ML IJ SOLN
50 mcg/mL | INTRAMUSCULAR | Status: DC | PRN
Start: 2019-12-11 — End: 2019-12-13

## 2019-12-11 MED ORDER — ONDANSETRON (PF) 4 MG/2 ML INJECTION
4 mg/2 mL | INTRAMUSCULAR | Status: AC
Start: 2019-12-11 — End: 2019-12-11
  Administered 2019-12-11: 16:00:00 via INTRAVENOUS

## 2019-12-11 MED ORDER — SODIUM CHLORIDE 0.9 % IJ SYRG
INTRAMUSCULAR | Status: DC | PRN
Start: 2019-12-11 — End: 2019-12-13
  Administered 2019-12-12: 08:00:00 via INTRAVENOUS

## 2019-12-11 MED ORDER — OXYCODONE 5 MG TAB
5 mg | ORAL | Status: DC | PRN
Start: 2019-12-11 — End: 2019-12-13
  Administered 2019-12-12 – 2019-12-13 (×4): via ORAL

## 2019-12-11 MED ORDER — GABAPENTIN 300 MG CAP
300 mg | Freq: Two times a day (BID) | ORAL | Status: DC
Start: 2019-12-11 — End: 2019-12-13
  Administered 2019-12-12 – 2019-12-13 (×4): via ORAL

## 2019-12-11 MED ORDER — ESCITALOPRAM 10 MG TAB
10 mg | Freq: Every day | ORAL | Status: DC
Start: 2019-12-11 — End: 2019-12-13
  Administered 2019-12-12: 13:00:00 via ORAL

## 2019-12-11 MED ORDER — ACETAMINOPHEN 325 MG TABLET
325 mg | Freq: Four times a day (QID) | ORAL | Status: DC | PRN
Start: 2019-12-11 — End: 2019-12-13
  Administered 2019-12-12: 15:00:00 via ORAL

## 2019-12-11 MED ORDER — SODIUM CHLORIDE 0.9 % IJ SYRG
Freq: Three times a day (TID) | INTRAMUSCULAR | Status: DC
Start: 2019-12-11 — End: 2019-12-13
  Administered 2019-12-12 – 2019-12-13 (×3): via INTRAVENOUS

## 2019-12-11 MED ORDER — ONDANSETRON (PF) 4 MG/2 ML INJECTION
4 mg/2 mL | Freq: Four times a day (QID) | INTRAMUSCULAR | Status: DC | PRN
Start: 2019-12-11 — End: 2019-12-13
  Administered 2019-12-12 – 2019-12-13 (×3): via INTRAVENOUS

## 2019-12-11 MED ORDER — MORPHINE 2 MG/ML INJECTION
2 mg/mL | INTRAMUSCULAR | Status: DC | PRN
Start: 2019-12-11 — End: 2019-12-13
  Administered 2019-12-12: 08:00:00 via INTRAVENOUS

## 2019-12-11 MED ORDER — SODIUM CHLORIDE 0.9 % IV
INTRAVENOUS | Status: DC
Start: 2019-12-11 — End: 2019-12-13
  Administered 2019-12-12 – 2019-12-13 (×2): via INTRAVENOUS

## 2019-12-11 MED ORDER — ATORVASTATIN 10 MG TAB
10 mg | Freq: Every day | ORAL | Status: DC
Start: 2019-12-11 — End: 2019-12-13
  Administered 2019-12-12 – 2019-12-13 (×2): via ORAL

## 2019-12-11 MED ORDER — ACETAMINOPHEN 325 MG TABLET
325 mg | Freq: Four times a day (QID) | ORAL | Status: DC | PRN
Start: 2019-12-11 — End: 2019-12-13
  Administered 2019-12-13: 03:00:00 via ORAL

## 2019-12-11 MED ORDER — SODIUM CHLORIDE 0.9% BOLUS IV
0.9 % | Freq: Once | INTRAVENOUS | Status: AC
Start: 2019-12-11 — End: 2019-12-11
  Administered 2019-12-11: 22:00:00 via INTRAVENOUS

## 2019-12-11 MED ORDER — CYCLOBENZAPRINE 10 MG TAB
10 mg | Freq: Three times a day (TID) | ORAL | Status: DC | PRN
Start: 2019-12-11 — End: 2019-12-13
  Administered 2019-12-12 (×2): via ORAL

## 2019-12-11 MED ORDER — MELATONIN 3 MG TAB
3 mg | Freq: Every evening | ORAL | Status: DC | PRN
Start: 2019-12-11 — End: 2019-12-13
  Administered 2019-12-12: 02:00:00 via ORAL

## 2019-12-11 MED ORDER — SENNOSIDES-DOCUSATE SODIUM 8.6 MG-50 MG TAB
Freq: Two times a day (BID) | ORAL | Status: DC
Start: 2019-12-11 — End: 2019-12-13
  Administered 2019-12-12 – 2019-12-13 (×3): via ORAL

## 2019-12-11 MED ORDER — ACETAMINOPHEN 650 MG RECTAL SUPPOSITORY
650 mg | Freq: Four times a day (QID) | RECTAL | Status: DC | PRN
Start: 2019-12-11 — End: 2019-12-13

## 2019-12-11 MED ORDER — ROPINIROLE 1 MG TAB
1 mg | Freq: Every evening | ORAL | Status: DC
Start: 2019-12-11 — End: 2019-12-13
  Administered 2019-12-12 – 2019-12-13 (×2): via ORAL

## 2019-12-11 MED ORDER — BUPROPION SR 150 MG TAB
150 mg | Freq: Two times a day (BID) | ORAL | Status: DC
Start: 2019-12-11 — End: 2019-12-13
  Administered 2019-12-12 – 2019-12-13 (×4): via ORAL

## 2019-12-11 MED ORDER — LORAZEPAM 2 MG/ML IJ SOLN
2 mg/mL | INTRAMUSCULAR | Status: AC
Start: 2019-12-11 — End: 2019-12-11
  Administered 2019-12-11: 20:00:00 via INTRAVENOUS

## 2019-12-11 MED ORDER — ONDANSETRON 4 MG TAB, RAPID DISSOLVE
4 mg | Freq: Three times a day (TID) | ORAL | Status: DC | PRN
Start: 2019-12-11 — End: 2019-12-13

## 2019-12-11 MED ORDER — MORPHINE 2 MG/ML INJECTION
2 mg/mL | INTRAMUSCULAR | Status: AC
Start: 2019-12-11 — End: 2019-12-11
  Administered 2019-12-11: 18:00:00 via INTRAVENOUS

## 2019-12-11 MED ORDER — ROPINIROLE 1 MG TAB
1 mg | Freq: Every evening | ORAL | Status: DC
Start: 2019-12-11 — End: 2019-12-11

## 2019-12-11 MED FILL — BD POSIFLUSH NORMAL SALINE 0.9 % INJECTION SYRINGE: INTRAMUSCULAR | Qty: 40

## 2019-12-11 MED FILL — LORAZEPAM 2 MG/ML IJ SOLN: 2 mg/mL | INTRAMUSCULAR | Qty: 1

## 2019-12-11 MED FILL — MORPHINE 2 MG/ML INJECTION: 2 mg/mL | INTRAMUSCULAR | Qty: 2

## 2019-12-11 MED FILL — ONDANSETRON (PF) 4 MG/2 ML INJECTION: 4 mg/2 mL | INTRAMUSCULAR | Qty: 2

## 2019-12-11 MED FILL — SODIUM CHLORIDE 0.9 % IV: INTRAVENOUS | Qty: 500

## 2019-12-11 NOTE — ED Notes (Signed)
Pt is at MRI

## 2019-12-11 NOTE — ED Notes (Signed)
Pt was able to independently ambulate from waiting room to room.

## 2019-12-11 NOTE — ED Provider Notes (Signed)
ED Provider Notes by Uvaldo Bristle, MD at 12/11/19 1735                Author: Uvaldo Bristle, MD  Service: Emergency Medicine  Author Type: Physician       Filed: 12/11/19 1829  Date of Service: 12/11/19 1735  Status: Signed          Editor: Uvaldo Bristle, MD (Physician)               EMERGENCY DEPARTMENT HISTORY AND PHYSICAL EXAM           Date: 12/11/2019   Patient Name: Lindsay [name]        History of Presenting Illness          Chief Complaint       Patient presents with        ?  Back Pain             She fell on Thursday and came in here to look at her back. She woke up this morning at 0300 and has taken a muscle relaxant and pain pill. Her restless  leg syndrome had gotten so bad this morning that she can't hold still. she thinks it is related to the back pain and fall because her normal meds are not helping.           History Provided By: Patient      HPI: Lindsay [name],  64 y.o. female with a past medical history significant for DVT, frequent falls, PE, TIA,  chronic pain, multiple rounds of stated below presents to the ED with cc of progressively worsening lower back pain and contractions in her legs.  She was seen here on June 3 after a fall and was diagnosed with a T12 compression fracture.  She was sent  home with pain medications and muscle relaxants but her pain is worsened.  She reports today that she lost control of her bladder and has been constipated as well.  She denies any radicular neurologic deficits or motor dysfunction.  She denies any fevers  or chills.  She reports he takes medication for restless leg syndrome but is not helping.  No other associated symptoms.  No other exacerbating ameliorating factors.      There are no other complaints, changes, or physical findings at this time.      PCP: Flora Lipps, MD        No current facility-administered medications on file prior to encounter.          Current Outpatient Medications on File Prior to  Encounter          Medication  Sig  Dispense  Refill           ?  cyclobenzaprine (FLEXERIL) 10 mg tablet  Take 1 Tablet by mouth three (3) times daily as needed for Muscle Spasm(s).  21 Tablet  0     ?  oxyCODONE IR (Roxicodone) 5 mg immediate release tablet  Take 1 Tablet by mouth every six (6) hours as needed for Pain for up to 5 days. Max Daily Amount: 20 mg.  20 Tablet  0     ?  cyclobenzaprine (FLEXERIL) 10 mg tablet  Take 1 Tablet by mouth three (3) times daily as needed for Muscle Spasm(s).  21 Tablet  0     ?  predniSONE (DELTASONE) 20 mg tablet  Take 40 mg by mouth daily for 5 days. With Breakfast  10 Tablet  0     ?  lidocaine 4 % patch  1 Patch by TransDERmal route every twelve (12) hours every twelve (12) hours.  5 Patch  2     ?  atorvastatin (LIPITOR) 10 mg tablet  Take 1 Tab by mouth daily.  30 Tab  3     ?  gabapentin enacarbil (Horizant) 600 mg TbER  Take 600 mg by mouth two (2) times a day. Max Daily Amount: 1,200 mg.  180 Tab  0     ?  rOPINIRole (REQUIP) 1 mg tablet  TAKE 1 TABLET AT 1-2PM AND TAKE 3 TABLETS BY MOUTH 1 TO 3 HOURS BEFORE BEDTIME EVERYDAY  360 Tab  0     ?  diazePAM (VALIUM) 5 mg tablet  diazepam 5 mg tablet         ?  ibuprofen (MOTRIN) 600 mg tablet  Take 1 Tab by mouth every six (6) hours as needed for Pain.  20 Tab  0     ?  buPROPion XL (WELLBUTRIN XL) 150 mg tablet  150 mg two (2) times a day. Take 1 tab BID    0     ?  aspirin 81 mg chewable tablet  Take 1 Tab by mouth daily. (Patient not taking: Reported on 12/06/2019)  30 Tab  0     ?  ergocalciferol (ERGOCALCIFEROL) 50,000 unit capsule  Take 1 Cap by mouth every seven (7) days.  12 Cap  1     ?  escitalopram oxalate (LEXAPRO) 20 mg tablet  Take 1 Tab by mouth daily.  30 Tab  5           ?  omeprazole (PRILOSEC) 20 mg capsule  Take 20 mg by mouth nightly.                 Past History        Past Medical History:     Past Medical History:        Diagnosis  Date         ?  Abnormal brain scan       ?  Anxiety disorder       ?   Arthritis       ?  Chronic pain       ?  Depression       ?  DVT (deep venous thrombosis) ([place])       ?  Falls frequently       ?  Fracture of left ankle       ?  ZOXWRUEA(540.9)  2011     ?  Ill-defined condition  09/15/2015          FX R HAND         ?  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  Other and unspecified hyperlipidemia  12/27/2012     ?  PE (pulmonary embolism)  12/2012     ?  RLS (restless legs syndrome)  03/16/2016          Dr Roseanne Reno         ?  TIA (transient ischemic attack)  03/2016          Right facial droop, numbness in lip         ?  Torn rotator cuff            right           Past  Surgical History:     Past Surgical History:         Procedure  Laterality  Date          ?  HX APPENDECTOMY         ?  HX CESAREAN SECTION              x4          ?  HX GI              COLONOSCOPY          ?  HX KNEE REPLACEMENT  Left  10/29/2015     ?  HX ORTHOPAEDIC    08/2004          rotater cuff r. shoulder          ?  HX ORTHOPAEDIC              left ankle fracture treated with air cast          ?  HX ORTHOPAEDIC    6/13          left shoulder fxt and dislocation          ?  HX OTHER SURGICAL    2010          left foot torn tendon          ?  HX TUBAL LIGATION         ?  IMPLANT BREAST SILICONE/EQ    1610          Bilateral breast implants          ?  PR BREAST SURGERY PROCEDURE UNLISTED    2002          AUGMENTATION           Family History:     Family History         Problem  Relation  Age of Onset          ?  Heart Disease  Mother       ?  Heart Disease  Father       ?  Cancer  Father                thyroid cancer          ?  Arthritis-rheumatoid  Sister       ?  No Known Problems  Son       ?  No Known Problems  Daughter       ?  No Known Problems  Daughter       ?  No Known Problems  Daughter            ?  Anesth Problems  Neg Hx             Social History:     Social History          Tobacco Use         ?  Smoking status:  Never Smoker     ?  Smokeless tobacco:  Never Used       Substance Use Topics          ?  Alcohol use:  No             Comment: Pt denies         ?  Drug use:  No           Allergies:   No Known Allergies  Review of Systems     Review of Systems    Constitutional: Negative for chills, diaphoresis, fatigue and fever.    HENT: Negative for ear pain and sore throat.     Eyes: Negative for pain and redness.    Respiratory: Negative for cough and shortness of breath.     Cardiovascular: Negative for chest pain and leg swelling.    Gastrointestinal: Negative for abdominal pain, diarrhea, nausea and vomiting.    Endocrine: Negative for cold intolerance and heat intolerance.    Genitourinary: Negative for flank pain and hematuria.    Musculoskeletal: Positive for back pain. Negative for neck stiffness.    Skin: Negative for rash and wound.    Neurological: Negative for dizziness, syncope and headaches.    All other systems reviewed and are negative.           Physical Exam     Physical Exam   Vitals and nursing note reviewed.   Constitutional:        General: She is in acute distress.      Appearance: She is well-developed. She is not ill-appearing.      Comments:  Acute distress secondary to pain    HENT:       Head: Normocephalic and atraumatic.      Mouth/Throat:      Pharynx: No oropharyngeal exudate.    Eyes:       Conjunctiva/sclera: Conjunctivae normal.      Pupils: Pupils are equal, round, and reactive to light.   Cardiovascular:       Rate and Rhythm: Normal rate and regular rhythm.      Heart sounds: No murmur heard.      Pulmonary:       Effort: Pulmonary effort is normal. No respiratory distress.      Breath sounds: Normal breath sounds. No wheezing.    Abdominal:      General: Bowel sounds are normal. There is no distension.      Palpations: Abdomen is soft.      Tenderness: There is no abdominal tenderness.     Musculoskeletal:          General: No deformity. Normal range of motion.        Arms:         Cervical back: Normal range of motion.      Comments: Patient is here  tenderness palpation around the T12 region.     Skin:      General: Skin is warm and dry.      Findings: No rash.    Neurological:       Mental Status: She is alert and oriented to person, place, and time.      Coordination: Coordination normal.      Comments: Patient has normal sensation normal function L2-S1 normal  motor function L2-S1.  Patient has no saddle anesthesia and normal rectal tone.    Psychiatric:         Behavior: Behavior normal.               Diagnostic Study Results        Labs -         Recent Results (from the past 24 hour(s))     CBC WITH AUTOMATED DIFF          Collection Time: 12/11/19 11:50 AM         Result  Value  Ref Range  WBC  11.5 (H)  3.6 - 11.0 K/uL            RBC  3.92  3.80 - 5.20 M/uL            HGB  10.1 (L)  11.5 - 16.0 g/dL       HCT  32.5 (L)  35.0 - 47.0 %       MCV  82.9  80.0 - 99.0 FL       MCH  25.8 (L)  26.0 - 34.0 PG       MCHC  31.1  30.0 - 36.5 g/dL       RDW  14.3  11.5 - 14.5 %       PLATELET  236  150 - 400 K/uL       MPV  9.3  8.9 - 12.9 FL       NRBC  0.0  0 PER 100 WBC       ABSOLUTE NRBC  0.00  0.00 - 0.01 K/uL       NEUTROPHILS  57  32 - 75 %       LYMPHOCYTES  29  12 - 49 %       MONOCYTES  9  5 - 13 %       EOSINOPHILS  4  0 - 7 %       BASOPHILS  0  0 - 1 %       IMMATURE GRANULOCYTES  1 (H)  0.0 - 0.5 %       ABS. NEUTROPHILS  6.5  1.8 - 8.0 K/UL       ABS. LYMPHOCYTES  3.3  0.8 - 3.5 K/UL       ABS. MONOCYTES  1.1 (H)  0.0 - 1.0 K/UL       ABS. EOSINOPHILS  0.5 (H)  0.0 - 0.4 K/UL       ABS. BASOPHILS  0.1  0.0 - 0.1 K/UL       ABS. IMM. GRANS.  0.1 (H)  0.00 - 0.04 K/UL       DF  AUTOMATED          METABOLIC PANEL, COMPREHENSIVE          Collection Time: 12/11/19 11:50 AM         Result  Value  Ref Range            Sodium  136  136 - 145 mmol/L       Potassium  4.1  3.5 - 5.1 mmol/L       Chloride  104  97 - 108 mmol/L       CO2  29  21 - 32 mmol/L       Anion gap  3 (L)  5 - 15 mmol/L       Glucose  85  65 - 100 mg/dL            BUN  23 (H)  6  - 20 MG/DL            Creatinine  0.74  0.55 - 1.02 MG/DL       BUN/Creatinine ratio  31 (H)  12 - 20         GFR est AA  >60  >60 ml/min/1.28m       GFR est non-AA  >60  >60 ml/min/1.715m      Calcium  9.4  8.5 - 10.1 MG/DL  Bilirubin, total  0.4  0.2 - 1.0 MG/DL       ALT (SGPT)  25  12 - 78 U/L       AST (SGOT)  16  15 - 37 U/L       Alk. phosphatase  98  45 - 117 U/L       Protein, total  7.0  6.4 - 8.2 g/dL       Albumin  3.6  3.5 - 5.0 g/dL       Globulin  3.4  2.0 - 4.0 g/dL       A-G Ratio  1.1  1.1 - 2.2         URINALYSIS W/ REFLEX CULTURE          Collection Time: 12/11/19 11:56 AM       Specimen: Urine         Result  Value  Ref Range            Color  YELLOW/STRAW          Appearance  CLEAR  CLEAR         Specific gravity  1.005  1.003 - 1.030         pH (UA)  6.5  5.0 - 8.0         Protein  Negative  NEG mg/dL       Glucose  Negative  NEG mg/dL       Ketone  Negative  NEG mg/dL       Bilirubin  Negative  NEG         Blood  Negative  NEG         Urobilinogen  0.2  0.2 - 1.0 EU/dL       Nitrites  Negative  NEG         Leukocyte Esterase  Negative  NEG         WBC  0-4  0 - 4 /hpf       RBC  0-5  0 - 5 /hpf       Epithelial cells  FEW  FEW /lpf       Bacteria  Negative  NEG /hpf       UA:UC IF INDICATED  CULTURE NOT INDICATED BY UA RESULT  CNI              Hyaline cast  0-2  0 - 5 /lpf           Radiologic Studies -      MRI LUMB SPINE WO CONT       Final Result       1.  Increase loss of height at T12 with increased bony retropulsion and moderate       severe canal stenosis. Possible compression on the cord although no abnormal     cord signal is identified.          2.  Other degenerative changes in the lumbar spine as described.          3.  Mild superior endplate compression deformity of L4 with a chronic            MRI [facility] SPINE WO CONT       Final Result     1.  Increase loss of height at T12 with increased bony retropulsion and moderate     severe canal stenosis. Possible compression on  the cord although no abnormal     cord signal is identified.  2.  Other degenerative changes in the lumbar spine as described.          3.  Mild superior endplate compression deformity of L4 with a chronic                 CT Results   (Last 48 hours)          None                 CXR Results   (Last 48 hours)          None                       Medical Decision Making     I am the first provider for this patient.      I reviewed the vital signs, available nursing notes, past medical history, past surgical history, family history and social history.      Vital Signs-Reviewed the patient's vital signs.   Patient Vitals for the past 12 hrs:            Temp  Pulse  Resp  BP  SpO2            12/11/19 1455  --  74  18  (!) 96/54  100 %            12/11/19 1122  --  --  --  --  96 %            12/11/19 1028  97.6 ??F (36.4 ??C)  (!) 102  20  123/79  100 %           Records Reviewed: Nursing records and medical records reviewed      MDM:   The patient presents with acute low back pain. Stable vitals and benign exam. DDx: strain, sprain, sciatica, MSK pain, aortic dissection, AAA, kidney stone, pyelonephritis, cauda equina syndrome, epidural  abscess/hematoma, metastatic malignancy, acute MI.              Provider Notes (Medical Decision Making):    Patient is a 64 year old female presenting with her second visit after a fall June 3 for T12 compression fracture.  She has failed outpatient management with oral medications for pain control.  Her  MRI today shows a compression of the degree of fracture with increased retropulsion and some compression on the spinal cord.  She is completely neurologically intact and has no indication for emergent surgery.  However myself and the spine surgery team  both agree that patient may benefit from kyphoplasty to improve her symptoms.  Given the degree of pain medication that has been required to control her pain today, I think this is the most prudent course.      ED Course:     Initial assessment performed. The patients presenting problems have been discussed, and they are in agreement with the care plan formulated and outlined with them.  I have encouraged them to ask questions as they arise throughout their visit.               Medications Administered           LORazepam (ATIVAN) injection 2 mg         Admin Date   12/11/2019  Action   Given  Dose   2 mg  Route   IntraVENous  Administered By   Gerlene Fee  morphine injection 4 mg         Admin Date   12/11/2019  Action   Given  Dose   4 mg  Route   IntraVENous  Administered By   Gerlene Fee                      Admin Date   12/11/2019  Action   Given  Dose   4 mg  Route   IntraVENous  Administered By   Edmund Hilda K                         ondansetron [facility]) injection 4 mg         Admin Date   12/11/2019  Action   Given  Dose   4 mg  Route   IntraVENous  Administered By   Gerlene Fee                                       Critical Care:   None         Disposition:   Admit Note:   6:28 PM   Pt is being admitted by Dr. Posey Pronto. The results of their tests and reason(s) for their admission have been discussed with pt and/or available family. They convey agreement and understanding for the need to be admitted and for admission diagnosis.                 Diagnosis        Clinical Impression:       1.  Compression fracture of T12 vertebra, initial encounter [facility])            Attestations:      Uvaldo Bristle, MD      Please note that this dictation was completed with Dragon, the computer voice recognition software.  Quite often unanticipated grammatical, syntax, homophones, and other interpretive errors are  inadvertently transcribed by the computer software.  Please disregard these errors.  Please excuse any errors that have escaped final proofreading.  Thank you.

## 2019-12-11 NOTE — ED Notes (Signed)
Pts pre void scan was she then urinated and post void scan was 43mL.

## 2019-12-11 NOTE — ED Notes (Signed)
MRI screening form completed and faxed

## 2019-12-11 NOTE — Consults (Signed)
Ortho:    Acute on chronic back pain, radiculopathy  No concern for cauda equina per ED doc  Followed by neurology and her PCP  Pt with mgt plan - reviewed    Ambulatory in the ED during her current visit  Post void residual bladder scan-59mls    MRI reviewed  Consider IR consult for kyphoplasty  PT/rehab  Out-pt FU     Plan per Dr Mammie Russian  Leanor Rubenstein, PA-C

## 2019-12-11 NOTE — ED Notes (Signed)
Patient is being transferred to [facility], Room # 3207.  Report given to Shelda Pal, RN on [name] for routine progression of care.  Report consisted of the following information SBAR, ED Summary, MAR and Recent Results.      Outstanding consults needed: No     Next labs due: Yes 0400    The following personal items will be sent with the patient during transfer to the floor:    All valuables:    Cardiac monitoring ordered: No     The following CURRENT information was reported to the receiving RN:    Code status: Full Code at time of transfer    Last set of vital signs:  Vital Signs  Level of Consciousness: Alert (0) (12/11/19 1951)  Temp: 97.8 F (36.6 C) (12/11/19 1951)  Temp Source: Oral (12/11/19 1951)  Pulse (Heart Rate): 78 (12/11/19 1951)  Heart Rate Source: Monitor (12/11/19 1028)  Resp Rate: 15 (12/11/19 1951)  BP: (!) 103/50 (12/11/19 1951)  MAP (Monitor): 66 (12/11/19 1900)  MAP (Calculated): 68 (12/11/19 1951)  BP 1 Location: Left upper arm (12/11/19 1028)  BP 1 Method: Automatic (12/11/19 1028)  BP Patient Position: Sitting (12/11/19 1028)  MEWS Score: 1 (12/11/19 1951)         Oxygen Therapy  O2 Sat (%): 97 % (12/11/19 1951)  Pulse via Oximetry: 85 beats per minute (12/11/19 1900)  O2 Device: Nasal cannula (12/11/19 1230)  O2 Flow Rate (L/min): 2 l/min (12/11/19 1230)      Last pain assessment:  Pain 1  Pain Scale 1: Numeric (0 - 10)  Pain Intensity 1: 1  Patient Stated Pain Goal: 1  Pain Reassessment 1: Yes  Pain Onset 1: 6/3  Pain Location 1: Back  Pain Orientation 1: Lower  Pain Description 1: Aching      Wounds: No         LDAs:       Peripheral IV 12/11/19 Right Antecubital (Active)   Site Assessment Clean, dry, & intact 12/11/19 1152   Phlebitis Assessment 0 12/11/19 1152   Infiltration Assessment 0 12/11/19 1152   Dressing Status Clean, dry, & intact 12/11/19 1152   Dressing Type Transparent 12/11/19 1152   Hub Color/Line Status Pink;Flushed 12/11/19 1152   Action Taken Blood  drawn 12/11/19 1152         Opportunity for questions and clarification was provided.    Coralee Pesa

## 2019-12-11 NOTE — H&P (Signed)
H&P by Kerman Passey, MD  at 12/11/19 1907                Author: Kerman Passey, MD  Service: Internal Medicine  Author Type: Physician       Filed: 12/11/19 1945  Date of Service: 12/11/19 1907  Status: Signed          Editor: Kerman Passey, MD (Physician)                                  Hospitalist Admission Note      NAME: Lindsay [name]    DOB:  [birth year]    MRN:  [id number]       Date/Time:  12/11/2019 7:07 PM      Patient PCP: Flora Lipps, MD   _____________________________________________________________________   Given the patient's current clinical presentation, I have a high level of concern for decompensation if discharged from the emergency department.  Complex decision making was performed, which includes  reviewing the patient's available past medical records, laboratory results, and x-ray films.         My assessment of this patient's clinical condition and my plan of care is as follows.      Assessment / Plan:        Acute back pain due to acute T12 compression fracture, s/p fall   -CT Lumbar spine   IMPRESSION   1. Acute compression fracture at T12, with mild retropulsion of bone but no   significant compression of the conus medullaris or cauda is obvious. No   significant associated hematoma, epidural or paraspinal.    2. Nonacute superior endplate depression at L4.   3. Multilevel degenerative change.      -MRI Lumbar spine   IMPRESSION   1.  Increase loss of height at T12 with increased bony retropulsion and moderate   severe canal stenosis. Possible compression on the cord although no abnormal   cord signal is identified.   2.  Other degenerative changes in the lumbar spine as described   3.  Mild superior endplate compression deformity of L4 with a chronic      -Bowel regimen   -SCD   -Pain management   -Consult IR in a.m. for kyphoplasty   -PT/OT eval and treat.  Patient likely will benefit from rehab   -Appreciate orthopedic surgery input         Dyslipidemia   Restless leg  syndrome   Depression   History of DVT/PE   History of TIA   Bilateral knee replacements 2016   -Continue Lipitor, Wellbutrin, Lexapro, Neurontin and Requip   -Hold aspirin in anticipation of kyphoplasty      Code Status: Full   Surrogate Decision Maker: Spouse      DVT Prophylaxis: SCD.  Hold Lovenox in anticipation of kyphoplasty   GI Prophylaxis: not indicated      Baseline: Married lives with husband.  Does not smoke or drink              Subjective:     CHIEF COMPLAINT: Back pain       HISTORY OF PRESENT ILLNESS:      Lindsay [name] is a  64 y.o. female with a history of DVT/PE, TIA who presents with worsening back pain.  She  had a recent fall at home as she was coming down a step stool and landed directly onto  her lower back.  She denied head trauma or loss of consciousness.  She was evaluated in the ED,6/03 and diagnosed with a T12 compression fracture, seen on CT of her  lumbar spine.  She was discharged with oxycodone, Flexeril and prednisone.  Today he presented to the ED because her restless leg syndrome seemed worse and she was concerned it may be related to her recent fall.  Because of a concern for cord compression,  MRI of the lumbar spine was completed with the above results.  Patient was seen by orthopedics and recommended kyphoplasty.      We were asked to admit for work up and evaluation of the above problems.         Past Medical History:        Diagnosis  Date         ?  Abnormal brain scan       ?  Anxiety disorder       ?  Arthritis       ?  Chronic pain       ?  Depression       ?  DVT (deep venous thrombosis) ([place])       ?  Falls frequently       ?  Fracture of left ankle       ?  TIRWERXV(400.8)  2011     ?  Ill-defined condition  09/15/2015          FX R HAND         ?  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  Other and unspecified hyperlipidemia  12/27/2012     ?  PE (pulmonary embolism)  12/2012     ?  RLS (restless legs syndrome)  03/16/2016          Dr Roseanne Reno         ?  TIA (transient  ischemic attack)  03/2016          Right facial droop, numbness in lip         ?  Torn rotator cuff            right              Past Surgical History:         Procedure  Laterality  Date          ?  HX APPENDECTOMY         ?  HX CESAREAN SECTION              x4          ?  HX GI              COLONOSCOPY          ?  HX KNEE REPLACEMENT  Left  10/29/2015     ?  HX ORTHOPAEDIC    08/2004          rotater cuff r. shoulder          ?  HX ORTHOPAEDIC              left ankle fracture treated with air cast          ?  HX ORTHOPAEDIC    6/13          left shoulder fxt and dislocation          ?  [place] OTHER SURGICAL    2010  left foot torn tendon          ?  HX TUBAL LIGATION         ?  IMPLANT BREAST SILICONE/EQ    2355          Bilateral breast implants          ?  PR BREAST SURGERY PROCEDURE UNLISTED    2002          AUGMENTATION             Social History          Tobacco Use         ?  Smoking status:  Never Smoker     ?  Smokeless tobacco:  Never Used       Substance Use Topics         ?  Alcohol use:  No             Comment: Pt denies              Family History         Problem  Relation  Age of Onset          ?  Heart Disease  Mother       ?  Heart Disease  Father       ?  Cancer  Father                thyroid cancer          ?  Arthritis-rheumatoid  Sister       ?  No Known Problems  Son       ?  No Known Problems  Daughter       ?  No Known Problems  Daughter       ?  No Known Problems  Daughter            ?  Anesth Problems  Neg Hx          No Known Allergies         Prior to Admission medications             Medication  Sig  Start Date  End Date  Taking?  Authorizing Provider            cyclobenzaprine (FLEXERIL) 10 mg tablet  Take 1 Tablet by mouth three (3) times daily as needed for Muscle Spasm(s).  12/07/19      Flora Lipps, MD            oxyCODONE IR (Roxicodone) 5 mg immediate release tablet  Take 1 Tablet by mouth every six (6) hours as needed for Pain for up to 5 days. Max Daily Amount: 20 mg.   12/06/19  12/11/19    Windy Kalata, MD            cyclobenzaprine (FLEXERIL) 10 mg tablet  Take 1 Tablet by mouth three (3) times daily as needed for Muscle Spasm(s).  12/06/19      Nena Polio, MD     predniSONE (DELTASONE) 20 mg tablet  Take 40 mg by mouth daily for 5 days. With Breakfast  12/06/19  12/11/19    Nena Polio, MD     lidocaine 4 % patch  1 Patch by TransDERmal route every twelve (12) hours every twelve (12) hours.  12/06/19      Nena Polio, MD     atorvastatin (LIPITOR) 10 mg tablet  Take 1 Tab by  mouth daily.  11/06/19      Flora Lipps, MD     gabapentin enacarbil (Horizant) 600 mg TbER  Take 600 mg by mouth two (2) times a day. Max Daily Amount: 1,200 mg.  11/02/19      Chauncey Reading, MD     rOPINIRole (REQUIP) 1 mg tablet  TAKE 1 TABLET AT 1-2PM AND TAKE 3 TABLETS BY MOUTH 1 TO 3 HOURS BEFORE BEDTIME EVERYDAY  10/19/19      Chauncey Reading, MD     diazePAM (VALIUM) 5 mg tablet  diazepam 5 mg tablet        Provider, Historical     ibuprofen (MOTRIN) 600 mg tablet  Take 1 Tab by mouth every six (6) hours as needed for Pain.  06/18/17      Termeer, Stephani Police., MD     buPROPion XL (WELLBUTRIN XL) 150 mg tablet  150 mg two (2) times a day. Take 1 tab BID  03/12/17      Provider, Historical     aspirin 81 mg chewable tablet  Take 1 Tab by mouth daily.   Patient not taking: Reported on 12/06/2019  03/27/16      Jettie Booze, MD     ergocalciferol (ERGOCALCIFEROL) 50,000 unit capsule  Take 1 Cap by mouth every seven (7) days.  11/08/14      Flora Lipps, MD     escitalopram oxalate (LEXAPRO) 20 mg tablet  Take 1 Tab by mouth daily.  01/07/14      Flora Lipps, MD            omeprazole (PRILOSEC) 20 mg capsule  Take 20 mg by mouth nightly.        Provider, Historical           REVIEW OF SYSTEMS:      I am not able to complete the review of systems because:        The patient is intubated and sedated       The patient has altered mental status due to his acute medical problems       The patient has  baseline aphasia from prior stroke(s)       The patient has baseline dementia and is not reliable historian       The patient is in acute medical distress and unable to provide information                     Total of 12 systems reviewed as follows:        POSITIVE= underlined text  Negative = text not underlined   General:  fever, chills, sweats, generalized weakness, weight loss/gain,       loss of appetite    Eyes:    blurred vision, eye pain, loss of vision, double vision   ENT:    rhinorrhea, pharyngitis    Respiratory:   cough, sputum production, SOB, DOE, wheezing, pleuritic pain    Cardiology:   chest pain, palpitations, orthopnea, PND, edema, syncope    Gastrointestinal:  abdominal pain , N/V, diarrhea, dysphagia, constipation, bleeding    Genitourinary:  frequency, urgency, dysuria, hematuria, incontinence    Muskuloskeletal :  arthralgia, myalgia, back pain   Hematology:  easy bruising, nose or gum bleeding, lymphadenopathy    Dermatological: rash, ulceration, pruritis, color change / jaundice   Endocrine:   hot flashes or polydipsia    Neurological:  headache, dizziness, confusion, focal weakness,  paresthesia,      Speech difficulties, memory loss, gait difficulty   Psychological: Feelings of anxiety, depression, agitation        Objective:     VITALS:     Visit Vitals      BP  (!) 101/58     Pulse  76     Temp  97.6 ??F (36.4 ??C)     Resp  18     Ht  5' 3.5" (1.613 m)     Wt  69.4 kg (153 lb)     SpO2  96%        BMI  26.68 kg/m??           PHYSICAL EXAM:      General:    Alert, cooperative, no distress, appears stated age.      HEENT: Atraumatic, anicteric sclerae, pink conjunctivae      No oral ulcers, mucosa moist, throat clear, dentition fair   Neck:  Supple, symmetrical,  thyroid: non tender   Lungs:   Clear to auscultation bilaterally.  No Wheezing or Rhonchi. No rales.   Chest wall:  No tenderness  No Accessory muscle use.   Heart:   Regular  rhythm,  No  murmur   No edema   Abdomen:   Soft,  non-tender. Not distended.  Bowel sounds normal   Extremities: No cyanosis.  No clubbing,       Skin turgor normal, Capillary refill normal, Radial dial pulse 2+   Skin:     Not pale.  Not Jaundiced  No rashes    Psych:  Good insight.  Not depressed.  Not anxious or agitated.   Neurologic: EOMs intact. No facial asymmetry. No aphasia or slurred speech. Symmetrical strength, Sensation grossly intact. Alert and oriented X 4.       _______________________________________________________________________   Care Plan discussed with:           Comments         Patient  x           Family              RN         Care Manager                            Consultant:          _______________________________________________________________________   Expected  Disposition:       Home with Family  x        HH/PT/OT/RN       SNF/LTC          [facility]       ________________________________________________________________________   TOTAL TIME:  32  Minutes      Critical Care Provided     Minutes non procedure based              Comments           x  Reviewed previous records         >50% of visit spent in counseling and coordination of care    Discussion with patient and/or family and questions answered           Given the patient's current clinical presentation, I have a high level of concern for decompensation if discharged from the ED. Complex decision making was  performed which includes reviewing the patient's available past medical records, laboratory results, and Xray films. I have also directly  communicated my plan and discussed this case with the involved ED physician.       ____________________________________________________________________   Kerman Passey, MD      Procedures: see electronic medical records for all procedures/Xrays and details which were not copied into this note but were reviewed prior to creation of Plan.      LAB DATA REVIEWED:       Recent Results (from the past 24 hour(s))     CBC WITH AUTOMATED DIFF           Collection Time: 12/11/19 11:50 AM         Result  Value  Ref Range            WBC  11.5 (H)  3.6 - 11.0 K/uL       RBC  3.92  3.80 - 5.20 M/uL       HGB  10.1 (L)  11.5 - 16.0 g/dL       HCT  32.5 (L)  35.0 - 47.0 %       MCV  82.9  80.0 - 99.0 FL       MCH  25.8 (L)  26.0 - 34.0 PG       MCHC  31.1  30.0 - 36.5 g/dL       RDW  14.3  11.5 - 14.5 %       PLATELET  236  150 - 400 K/uL       MPV  9.3  8.9 - 12.9 FL       NRBC  0.0  0 PER 100 WBC       ABSOLUTE NRBC  0.00  0.00 - 0.01 K/uL       NEUTROPHILS  57  32 - 75 %       LYMPHOCYTES  29  12 - 49 %       MONOCYTES  9  5 - 13 %       EOSINOPHILS  4  0 - 7 %       BASOPHILS  0  0 - 1 %       IMMATURE GRANULOCYTES  1 (H)  0.0 - 0.5 %       ABS. NEUTROPHILS  6.5  1.8 - 8.0 K/UL       ABS. LYMPHOCYTES  3.3  0.8 - 3.5 K/UL       ABS. MONOCYTES  1.1 (H)  0.0 - 1.0 K/UL       ABS. EOSINOPHILS  0.5 (H)  0.0 - 0.4 K/UL       ABS. BASOPHILS  0.1  0.0 - 0.1 K/UL       ABS. IMM. GRANS.  0.1 (H)  0.00 - 0.04 K/UL       DF  AUTOMATED          METABOLIC PANEL, COMPREHENSIVE          Collection Time: 12/11/19 11:50 AM         Result  Value  Ref Range            Sodium  136  136 - 145 mmol/L       Potassium  4.1  3.5 - 5.1 mmol/L       Chloride  104  97 - 108 mmol/L       CO2  29  21 - 32 mmol/L       Anion gap  3 (L)  5 - 15 mmol/L  Glucose  85  65 - 100 mg/dL       BUN  23 (H)  6 - 20 MG/DL       Creatinine  0.74  0.55 - 1.02 MG/DL       BUN/Creatinine ratio  31 (H)  12 - 20         GFR est AA  >60  >60 ml/min/1.76m       GFR est non-AA  >60  >60 ml/min/1.717m      Calcium  9.4  8.5 - 10.1 MG/DL       Bilirubin, total  0.4  0.2 - 1.0 MG/DL       ALT (SGPT)  25  12 - 78 U/L       AST (SGOT)  16  15 - 37 U/L       Alk. phosphatase  98  45 - 117 U/L       Protein, total  7.0  6.4 - 8.2 g/dL       Albumin  3.6  3.5 - 5.0 g/dL       Globulin  3.4  2.0 - 4.0 g/dL       A-G Ratio  1.1  1.1 - 2.2         URINALYSIS W/ REFLEX CULTURE          Collection Time: 12/11/19 11:56 AM        Specimen: Urine         Result  Value  Ref Range            Color  YELLOW/STRAW          Appearance  CLEAR  CLEAR         Specific gravity  1.005  1.003 - 1.030         pH (UA)  6.5  5.0 - 8.0         Protein  Negative  NEG mg/dL       Glucose  Negative  NEG mg/dL       Ketone  Negative  NEG mg/dL       Bilirubin  Negative  NEG         Blood  Negative  NEG         Urobilinogen  0.2  0.2 - 1.0 EU/dL       Nitrites  Negative  NEG         Leukocyte Esterase  Negative  NEG         WBC  0-4  0 - 4 /hpf       RBC  0-5  0 - 5 /hpf       Epithelial cells  FEW  FEW /lpf       Bacteria  Negative  NEG /hpf       UA:UC IF INDICATED  CULTURE NOT INDICATED BY UA RESULT  CNI              Hyaline cast  0-2  0 - 5 /lpf

## 2019-12-11 NOTE — ED Notes (Signed)
Called dietary for meal tray

## 2019-12-12 ENCOUNTER — Inpatient Hospital Stay: Admit: 2019-12-12 | Payer: MEDICARE | Primary: Internal Medicine

## 2019-12-12 ENCOUNTER — Encounter: Attending: Internal Medicine | Primary: Internal Medicine

## 2019-12-12 LAB — COMPREHENSIVE METABOLIC PANEL
ALT: 21 U/L (ref 12–78)
AST: 12 U/L — ABNORMAL LOW (ref 15–37)
Albumin/Globulin Ratio: 1 — ABNORMAL LOW (ref 1.1–2.2)
Albumin: 3.2 g/dL — ABNORMAL LOW (ref 3.5–5.0)
Alkaline Phosphatase: 99 U/L (ref 45–117)
Anion Gap: 2 mmol/L — ABNORMAL LOW (ref 5–15)
BUN: 20 MG/DL (ref 6–20)
Bun/Cre Ratio: 27 — ABNORMAL HIGH (ref 12–20)
CO2: 32 mmol/L (ref 21–32)
Calcium: 8 MG/DL — ABNORMAL LOW (ref 8.5–10.1)
Chloride: 103 mmol/L (ref 97–108)
Creatinine: 0.73 MG/DL (ref 0.55–1.02)
EGFR IF NonAfrican American: >60 mL/min/{1.73_m2} (ref >60)
GFR African American: >60 mL/min/{1.73_m2} (ref >60)
Globulin: 3.2 g/dL (ref 2.0–4.0)
Glucose: 86 mg/dL (ref 65–100)
Potassium: 3.9 mmol/L (ref 3.5–5.1)
Sodium: 137 mmol/L (ref 136–145)
Total Bilirubin: 0.3 MG/DL (ref 0.2–1.0)
Total Protein: 6.4 g/dL (ref 6.4–8.2)

## 2019-12-12 LAB — CBC WITH AUTO DIFFERENTIAL
Basophils %: 1 % (ref 0–1)
Basophils Absolute: 0 10*3/uL (ref 0.0–0.1)
Eosinophils %: 6 % (ref 0–7)
Eosinophils Absolute: 0.5 10*3/uL — ABNORMAL HIGH (ref 0.0–0.4)
Granulocyte Absolute Count: 0 10*3/uL (ref 0.00–0.04)
Hematocrit: 34.8 % — ABNORMAL LOW (ref 35.0–47.0)
Hemoglobin: 10.6 g/dL — ABNORMAL LOW (ref 11.5–16.0)
Immature Granulocytes: 0 % (ref 0.0–0.5)
Lymphocytes %: 33 % (ref 12–49)
Lymphocytes Absolute: 2.5 10*3/uL (ref 0.8–3.5)
MCH: 26.4 PG (ref 26.0–34.0)
MCHC: 30.5 g/dL (ref 30.0–36.5)
MCV: 86.6 FL (ref 80.0–99.0)
MPV: 9 FL (ref 8.9–12.9)
Monocytes %: 9 % (ref 5–13)
Monocytes Absolute: 0.7 10*3/uL (ref 0.0–1.0)
NRBC Absolute: 0 10*3/uL (ref 0.00–0.01)
Neutrophils %: 51 % (ref 32–75)
Neutrophils Absolute: 4 10*3/uL (ref 1.8–8.0)
Nucleated RBCs: 0 PER 100 WBC
Platelets: 211 10*3/uL (ref 150–400)
RBC: 4.02 M/uL (ref 3.80–5.20)
RDW: 14.5 % (ref 11.5–14.5)
WBC: 7.8 10*3/uL (ref 3.6–11.0)

## 2019-12-12 LAB — HEMOGLOBIN AND HEMATOCRIT
Hematocrit: 33.9 % — ABNORMAL LOW (ref 35.0–47.0)
Hemoglobin: 10 g/dL — ABNORMAL LOW (ref 11.5–16.0)

## 2019-12-12 LAB — METABOLIC PANEL, COMPREHENSIVE
A-G Ratio: 1 — ABNORMAL LOW (ref 1.1–2.2)
ALT (SGPT): 21 U/L (ref 12–78)
AST (SGOT): 12 U/L — ABNORMAL LOW (ref 15–37)
Albumin: 3.2 g/dL — ABNORMAL LOW (ref 3.5–5.0)
Alk. phosphatase: 99 U/L (ref 45–117)
Anion gap: 2 mmol/L — ABNORMAL LOW (ref 5–15)
BUN/Creatinine ratio: 27 — ABNORMAL HIGH (ref 12–20)
BUN: 20 MG/DL (ref 6–20)
Bilirubin, total: 0.3 MG/DL (ref 0.2–1.0)
CO2: 32 mmol/L (ref 21–32)
Calcium: 8 MG/DL — ABNORMAL LOW (ref 8.5–10.1)
Chloride: 103 mmol/L (ref 97–108)
Creatinine: 0.73 MG/DL (ref 0.55–1.02)
GFR est AA: >60 mL/min/{1.73_m2} (ref >60)
GFR est non-AA: >60 mL/min/{1.73_m2} (ref >60)
Globulin: 3.2 g/dL (ref 2.0–4.0)
Glucose: 86 mg/dL (ref 65–100)
Potassium: 3.9 mmol/L (ref 3.5–5.1)
Protein, total: 6.4 g/dL (ref 6.4–8.2)
Sodium: 137 mmol/L (ref 136–145)

## 2019-12-12 LAB — CBC WITH AUTOMATED DIFF
ABS. BASOPHILS: 0 10*3/uL (ref 0.0–0.1)
ABS. EOSINOPHILS: 0.5 10*3/uL — ABNORMAL HIGH (ref 0.0–0.4)
ABS. IMM. GRANS.: 0 10*3/uL (ref 0.00–0.04)
ABS. LYMPHOCYTES: 2.5 10*3/uL (ref 0.8–3.5)
ABS. MONOCYTES: 0.7 10*3/uL (ref 0.0–1.0)
ABS. NEUTROPHILS: 4 10*3/uL (ref 1.8–8.0)
ABSOLUTE NRBC: 0 10*3/uL (ref 0.00–0.01)
BASOPHILS: 1 % (ref 0–1)
EOSINOPHILS: 6 % (ref 0–7)
HCT: 34.8 % — ABNORMAL LOW (ref 35.0–47.0)
HGB: 10.6 g/dL — ABNORMAL LOW (ref 11.5–16.0)
IMMATURE GRANULOCYTES: 0 % (ref 0.0–0.5)
LYMPHOCYTES: 33 % (ref 12–49)
MCH: 26.4 PG (ref 26.0–34.0)
MCHC: 30.5 g/dL (ref 30.0–36.5)
MCV: 86.6 FL (ref 80.0–99.0)
MONOCYTES: 9 % (ref 5–13)
MPV: 9 FL (ref 8.9–12.9)
NEUTROPHILS: 51 % (ref 32–75)
NRBC: 0 PER 100 WBC
PLATELET: 211 10*3/uL (ref 150–400)
RBC: 4.02 M/uL (ref 3.80–5.20)
RDW: 14.5 % (ref 11.5–14.5)
WBC: 7.8 10*3/uL (ref 3.6–11.0)

## 2019-12-12 LAB — HGB & HCT
HCT: 33.9 % — ABNORMAL LOW (ref 35.0–47.0)
HGB: 10 g/dL — ABNORMAL LOW (ref 11.5–16.0)

## 2019-12-12 MED ORDER — SODIUM CHLORIDE 0.9 % IV
INTRAVENOUS | Status: DC
Start: 2019-12-12 — End: 2019-12-13
  Administered 2019-12-12: 19:00:00 via INTRAVENOUS

## 2019-12-12 MED ORDER — WATER FOR INJECTION, STERILE INJECTION
INTRAMUSCULAR | Status: DC
Start: 2019-12-12 — End: 2019-12-13

## 2019-12-12 MED ORDER — BUPIVACAINE 0.5 % (5 MG/ML) IJ SOLN
0.5 % (5 mg/mL) | Freq: Once | INTRAMUSCULAR | Status: DC
Start: 2019-12-12 — End: 2019-12-12

## 2019-12-12 MED ORDER — BUPIVACAINE (PF) 0.5 % (5 MG/ML) IJ SOLN
0.5 % (5 mg/mL) | Freq: Once | INTRAMUSCULAR | Status: AC
Start: 2019-12-12 — End: 2019-12-12
  Administered 2019-12-12: 20:00:00 via SUBCUTANEOUS

## 2019-12-12 MED ORDER — HEPARIN (PORCINE) IN NS (PF) 1,000 UNIT/500 ML IV
1000 unit/500 mL | Freq: Once | INTRAVENOUS | Status: AC
Start: 2019-12-12 — End: 2019-12-12
  Administered 2019-12-12: 20:00:00

## 2019-12-12 MED ORDER — BUPIVACAINE (PF) 0.5 % (5 MG/ML) IJ SOLN
0.5 % (5 mg/mL) | Freq: Once | INTRAMUSCULAR | Status: DC
Start: 2019-12-12 — End: 2019-12-12

## 2019-12-12 MED ORDER — MIDAZOLAM 1 MG/ML IJ SOLN
1 mg/mL | INTRAMUSCULAR | Status: DC | PRN
Start: 2019-12-12 — End: 2019-12-13
  Administered 2019-12-12: 20:00:00 via INTRAVENOUS

## 2019-12-12 MED ORDER — LIDOCAINE HCL 2 % (20 MG/ML) IJ SOLN
20 mg/mL (2 %) | Freq: Once | INTRAMUSCULAR | Status: AC
Start: 2019-12-12 — End: 2019-12-12
  Administered 2019-12-12: 20:00:00 via SUBCUTANEOUS

## 2019-12-12 MED ORDER — LACTATED RINGERS BOLUS IV
Freq: Once | INTRAVENOUS | Status: AC
Start: 2019-12-12 — End: 2019-12-12
  Administered 2019-12-12: 22:00:00 via INTRAVENOUS

## 2019-12-12 MED ORDER — CEFAZOLIN 1 GRAM SOLUTION FOR INJECTION
1 gram | INTRAMUSCULAR | Status: DC
Start: 2019-12-12 — End: 2019-12-13

## 2019-12-12 MED ORDER — FENTANYL CITRATE (PF) 50 MCG/ML IJ SOLN
50 mcg/mL | INTRAMUSCULAR | Status: DC | PRN
Start: 2019-12-12 — End: 2019-12-13
  Administered 2019-12-12: 20:00:00 via INTRAVENOUS

## 2019-12-12 MED ORDER — BUPIVACAINE (PF) 0.5 % (5 MG/ML) IJ SOLN
0.5 % (5 mg/mL) | Freq: Once | INTRAMUSCULAR | Status: DC
Start: 2019-12-12 — End: 2019-12-12
  Administered 2019-12-12: 19:00:00 via SUBCUTANEOUS

## 2019-12-12 MED ORDER — IOPAMIDOL 61 % INTRATHECAL
61 % | Freq: Once | INTRATHECAL | Status: AC
Start: 2019-12-12 — End: 2019-12-12
  Administered 2019-12-12: 20:00:00 via INTRASPINAL

## 2019-12-12 MED ORDER — WATER FOR INJECTION, STERILE INJECTION
1 gram | Freq: Once | INTRAMUSCULAR | Status: AC
Start: 2019-12-12 — End: 2019-12-12
  Administered 2019-12-12: 19:00:00 via INTRAVENOUS

## 2019-12-12 MED FILL — ROPINIROLE 1 MG TAB: 1 mg | ORAL | Qty: 3

## 2019-12-12 MED FILL — BUPROPION SR 150 MG TAB: 150 mg | ORAL | Qty: 1

## 2019-12-12 MED FILL — SODIUM CHLORIDE 0.9 % IV: INTRAVENOUS | Qty: 1000

## 2019-12-12 MED FILL — GABAPENTIN 300 MG CAP: 300 mg | ORAL | Qty: 2

## 2019-12-12 MED FILL — MAPAP (ACETAMINOPHEN) 325 MG TABLET: 325 mg | ORAL | Qty: 2

## 2019-12-12 MED FILL — FENTANYL CITRATE (PF) 50 MCG/ML IJ SOLN: 50 mcg/mL | INTRAMUSCULAR | Qty: 2

## 2019-12-12 MED FILL — LIPITOR 10 MG TABLET: 10 mg | ORAL | Qty: 1

## 2019-12-12 MED FILL — MELATONIN 3 MG TAB: 3 mg | ORAL | Qty: 1

## 2019-12-12 MED FILL — ONDANSETRON (PF) 4 MG/2 ML INJECTION: 4 mg/2 mL | INTRAMUSCULAR | Qty: 2

## 2019-12-12 MED FILL — ROPINIROLE 1 MG TAB: 1 mg | ORAL | Qty: 1

## 2019-12-12 MED FILL — ISOVUE-M 300  61 % INTRATHECAL SOLUTION: 300 mg iodine /mL (61 %) | INTRATHECAL | Qty: 15

## 2019-12-12 MED FILL — MORPHINE 2 MG/ML INJECTION: 2 mg/mL | INTRAMUSCULAR | Qty: 1

## 2019-12-12 MED FILL — LIDOCAINE HCL 2 % (20 MG/ML) IJ SOLN: 20 mg/mL (2 %) | INTRAMUSCULAR | Qty: 20

## 2019-12-12 MED FILL — OXYCODONE 5 MG TAB: 5 mg | ORAL | Qty: 1

## 2019-12-12 MED FILL — CYCLOBENZAPRINE 10 MG TAB: 10 mg | ORAL | Qty: 1

## 2019-12-12 MED FILL — ESCITALOPRAM 10 MG TAB: 10 mg | ORAL | Qty: 2

## 2019-12-12 MED FILL — LACTATED RINGERS IV: INTRAVENOUS | Qty: 1000

## 2019-12-12 MED FILL — DOK PLUS 8.6 MG-50 MG TABLET: ORAL | Qty: 1

## 2019-12-12 MED FILL — MIDAZOLAM 1 MG/ML IJ SOLN: 1 mg/mL | INTRAMUSCULAR | Qty: 5

## 2019-12-12 MED FILL — SENSORCAINE-MPF 0.5 % (5 MG/ML) INJECTION SOLUTION: 0.5 % (5 mg/mL) | INTRAMUSCULAR | Qty: 50

## 2019-12-12 MED FILL — STERILE WATER FOR INJECTION: INTRAMUSCULAR | Qty: 20

## 2019-12-12 MED FILL — CEFAZOLIN 1 GRAM SOLUTION FOR INJECTION: 1 gram | INTRAMUSCULAR | Qty: 2000

## 2019-12-12 MED FILL — HEPARIN (PORCINE) IN NS (PF) 1,000 UNIT/500 ML IV: 1000 unit/500 mL | INTRAVENOUS | Qty: 500

## 2019-12-12 NOTE — Progress Notes (Signed)
Attempted to see pt for OT services.  Pt has a  compression fracture and is currently NPO for possible kyphoplasty.  She also has active bedrest orders from 12/11/19 that was placed after previous OOB order was placed.  Will defer eval at this time but continue to follow.  Will need bedrest orders lifted.  Thank you.

## 2019-12-12 NOTE — Progress Notes (Signed)
Attempted to see pt for OT services.  Pt has a  compression fracture and is currently NPO for possible kyphoplasty.  She also has active bedrest orders from 12/11/19 that was placed after previous OOB order was placed.  Will defer eval at this time but continue to follow.  Will need bedrest orders lifted.  Thank you.    Alba Cory, PT

## 2019-12-12 NOTE — Progress Notes (Signed)
Patient arrived from room alert & oriented X4 and on room air with sats 88% placed on 2L NC. Consent obtained after MD Liane Comber spoke.

## 2019-12-12 NOTE — H&P (Signed)
Interventional and Vascular Radiology History and Physical    Patient: Lindsay [name] 64 y.o. female       Chief Complaint: Back Pain (She fell on Thursday and came in here to look at her back. She woke up this morning at 0300 and has taken a muscle relaxant and pain pill. Her restless leg syndrome had gotten so bad this morning that she can't hold still. she thinks it is related to the back pain and fall because her normal meds are not helping.)      History of Present Illness: T12 fracture     History:    Past Medical History:   Diagnosis Date   ??? Abnormal brain scan    ??? Anxiety disorder    ??? Arthritis    ??? Chronic pain    ??? Depression    ??? DVT (deep venous thrombosis) ([facility])    ??? Falls frequently    ??? Fracture of left ankle    ??? Headache(784.0) 2011   ??? Ill-defined condition 09/15/2015    FX R HAND   ??? 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 Other and unspecified hyperlipidemia 12/27/2012   ??? PE (pulmonary embolism) 12/2012   ??? RLS (restless legs syndrome) 03/16/2016    Dr Scarlett Presto   ??? TIA (transient ischemic attack) 03/2016    Right facial droop, numbness in lip   ??? Torn rotator cuff     right     Family History   Problem Relation Age of Onset   ??? Heart Disease Mother    ??? Heart Disease Father    ??? Cancer Father         thyroid cancer   ??? Arthritis-rheumatoid Sister    ??? No Known Problems Son    ??? No Known Problems Daughter    ??? No Known Problems Daughter    ??? No Known Problems Daughter    ??? Anesth Problems Neg Hx      Social History     Socioeconomic History   ??? Marital status: MARRIED     Spouse name: Not on file   ??? Number of children: Not on file   ??? Years of education: Not on file   ??? Highest education level: Not on file   Occupational History   ??? Not on file   Tobacco Use   ??? Smoking status: Never Smoker   ??? Smokeless tobacco: Never Used   Substance and Sexual Activity   ??? Alcohol use: No     Comment: Pt denies   ??? Drug use: No   ??? Sexual activity: Yes     Partners: Male   Other Topics Concern   ??? Not on file    Social History Narrative   ??? Not on file     Social Determinants of Health     Financial Resource Strain:    ??? Difficulty of Paying Living Expenses:    Food Insecurity:    ??? Worried About Programme researcher, broadcasting/film/video in the Last Year:    ??? Barista in the Last Year:    Transportation Needs:    ??? Freight forwarder (Medical):    ??? Lack of Transportation (Non-Medical):    Physical Activity:    ??? Days of Exercise per Week:    ??? Minutes of Exercise per Session:    Stress:    ??? Feeling of Stress :    Social Connections:    ??? Frequency of  Communication with Friends and Family:    ??? Frequency of Social Gatherings with Friends and Family:    ??? Attends Religious Services:    ??? Database administrator or Organizations:    ??? Attends Engineer, structural:    ??? Marital Status:    Intimate Programme researcher, broadcasting/film/video Violence:    ??? Fear of Current or Ex-Partner:    ??? Emotionally Abused:    ??? Physically Abused:    ??? Sexually Abused:        Allergies: No Known Allergies    Current Medications:  Current Facility-Administered Medications   Medication Dose Route Frequency   ??? iopamidoL (ISOVUE-M 300) 61 % contrast solution 3 mL  3 mL IntraSPINal ONCE   ??? lidocaine (XYLOCAINE) 20 mg/mL (2 %) injection 300 mg  15 mL SubCUTAneous ONCE   ??? heparinized saline 2 units/mL infusion 800 Units  400 mL Irrigation ONCE   ??? midazolam (VERSED) injection 0-10 mg  0-10 mg IntraVENous Multiple   ??? 0.9% sodium chloride infusion  25 mL/hr IntraVENous CONTINUOUS   ??? fentaNYL citrate (PF) injection 100 mcg  100 mcg IntraVENous Multiple   ??? ceFAZolin (ANCEF) 1 gram injection       ??? sterile water (preservative free) injection       ??? bupivacaine (PF) (MARCAINE) 0.5 % (5 mg/mL) injection 250 mg  50 mL SubCUTAneous ONCE   ??? acetaminophen (TYLENOL) tablet 650 mg  650 mg Oral Q6H PRN   ??? fentaNYL citrate (PF) injection 50 mcg  50 mcg IntraVENous Q4H PRN   ??? atorvastatin (LIPITOR) tablet 10 mg  10 mg Oral DAILY   ??? sodium chloride (NS) flush 5-40 mL  5-40 mL  IntraVENous Q8H   ??? sodium chloride (NS) flush 5-40 mL  5-40 mL IntraVENous PRN   ??? acetaminophen (TYLENOL) tablet 650 mg  650 mg Oral Q6H PRN    Or   ??? acetaminophen (TYLENOL) suppository 650 mg  650 mg Rectal Q6H PRN   ??? polyethylene glycol (MIRALAX) packet 17 g  17 g Oral DAILY PRN   ??? ondansetron (ZOFRAN ODT) tablet 4 mg  4 mg Oral Q8H PRN    Or   ??? ondansetron (ZOFRAN) injection 4 mg  4 mg IntraVENous Q6H PRN   ??? senna-docusate (PERICOLACE) 8.6-50 mg per tablet 1 Tablet  1 Tablet Oral BID   ??? oxyCODONE IR (ROXICODONE) tablet 5 mg  5 mg Oral Q4H PRN   ??? morphine injection 2 mg  2 mg IntraVENous Q4H PRN   ??? buPROPion SR (WELLBUTRIN SR) tablet 150 mg  150 mg Oral BID   ??? escitalopram oxalate (LEXAPRO) tablet 20 mg  20 mg Oral DAILY   ??? cyclobenzaprine (FLEXERIL) tablet 10 mg  10 mg Oral TID PRN   ??? gabapentin (NEURONTIN) capsule 600 mg  600 mg Oral BID   ??? rOPINIRole (REQUIP) tablet 3 mg  3 mg Oral QHS   ??? rOPINIRole (REQUIP) tablet 1 mg  1 mg Oral Q24H   ??? 0.9% sodium chloride infusion  50 mL/hr IntraVENous CONTINUOUS   ??? melatonin tablet 3 mg  3 mg Oral QHS PRN        Physical Exam:  Blood pressure 98/64, pulse 89, temperature 98.4 ??F (36.9 ??C), resp. rate 11, height 5' 3.5" (1.613 m), weight 69.4 kg (153 lb), SpO2 90 %.  LUNG: clear to auscultation bilaterally, HEART: regular rate and rhythm, S1, S2 normal, no murmur, click, rub or gallop  Alerts:    Hospital Problems  Date Reviewed: 2019/11/09        Codes Class Noted POA    Back pain ICD-10-CM: M54.9  ICD-9-CM: 724.5  12/11/2019 Unknown              Laboratory:      Recent Labs     12/12/19  0416   HGB 10.6*   HCT 34.8*   WBC 7.8   PLT 211   BUN 20   CREA 0.73   K 3.9         Plan of Care/Planned Procedure:  Risks, benefits, and alternatives reviewed with patient and she agrees to proceed with the procedure. Conscious sedation will be performed with IV fentanyl and versed. Plan is for T12 kyphoplasty       Kerin Perna, MD

## 2019-12-12 NOTE — Progress Notes (Signed)
 TRANSFER - IN REPORT:    Verbal report received from Donnine(name) on Lindsay [name]  being received from IR(unit) for routine post - op      Report consisted of patient's Situation, Background, Assessment and   Recommendations(SBAR).     Information from the following report(s) SBAR, Kardex, Intake/Output, MAR and Recent Results was reviewed with the receiving nurse.    Opportunity for questions and clarification was provided.      Assessment completed upon patient's arrival to unit and care assumed.       1635 - Dr. Felicitas notified of patient's BP of 84/46 after having kyphoplasty done, said to start LR bolus 500 mL and start H&Hs now and every 4 hours afterwards.  Said to keep patient NPO with sips of water  until BPs come up and check BP every hour.    End of Shift Note    Bedside shift change report given to Jenifer (oncoming nurse) by Harlene LITTIE Sang, RN (offgoing nurse).  Report included the following information SBAR, Kardex, Intake/Output, MAR and Recent Results    Shift worked:  Day     Shift summary and any significant changes:     Dr. Felicitas notified of patient's BP of 84/46 after having kyphoplasty done, said to start LR bolus 500 mL and start H&Hs now and every 4 hours afterwards.  Said to keep patient NPO with sips of water  until BPs come up.   Concerns for physician to address:  n/a     Zone phone for oncoming shift:  [phone]     Activity:  Activity Level: Up with Assistance  Number times ambulated in hallways past shift: 0  Number of times OOB to chair past shift: 0    Cardiac:   Cardiac Monitoring: No      Cardiac Rhythm: Sinus Rhythm    Access:   Current line(s): PIV     Genitourinary:   Urinary status: has not voided since kyphoplasty    Respiratory:   O2 Device: Nasal cannula  Chronic home O2 use?: NO  Incentive spirometer at bedside: YES     GI:     Current diet:  DIET NPO Sips of Water  with Meds  Passing flatus: YES  Tolerating current diet: YES       Pain Management:   Patient states pain  is manageable on current regimen: YES    Skin:  Braden Score: 21  Interventions: float heels and increase time out of bed    Patient Safety:  Fall Score: Total Score: 2  Interventions: gripper socks and pt to call before getting OOB  High Fall Risk: Yes    Length of Stay:  Expected LOS: 2d 21h  Actual LOS: 1      Jessica L Sookram, RN

## 2019-12-12 NOTE — Progress Notes (Signed)
CM attempted to complete initial assessment with patient however pt requested CM come back at a later time as she has a headache and nauseas. CM will continue to follow.     Dorathy Kinsman, [place], [place]  [phone]

## 2019-12-12 NOTE — Progress Notes (Signed)
Pt tolerated Kyphoplasty well  Start time 1522  End time 1615  Versed 2 mg  Fentanyl 50 mcg    Report called to nurse Shanda Bumps on ortho unit  VSS, Pt awake and alert.

## 2019-12-12 NOTE — Progress Notes (Signed)
Progress Notes by Caryl Bis, MD at 12/12/19 1019                Author: Caryl Bis, MD  Service: Internal Medicine  Author Type: Physician       Filed: 12/12/19 1521  Date of Service: 12/12/19 1019  Status: Signed          Editor: Caryl Bis, MD (Physician)               .           Hospitalist Progress Note      NAME: Lindsay [name]    DOB:  1956-02-05    MRN:  [id number]          Assessment / Plan:        Acute back pain due to acute T12 compression fracture, s/p fall   -CT Lumbar spine   IMPRESSION   1. Acute compression fracture at T12, with mild retropulsion of bone but no   significant compression of the conus medullaris or cauda is obvious. No   significant associated hematoma, epidural or paraspinal.    2. Nonacute superior endplate depression at L4.   3. Multilevel degenerative change.   ??   -MRI Lumbar spine   IMPRESSION   1. ??Increase loss of height at T12 with increased bony retropulsion and moderate   severe canal stenosis. Possible compression on the cord although no abnormal   cord signal is identified.   2. ??Other degenerative changes in the lumbar spine as described   3. ??Mild superior endplate compression deformity of L4 with a chronic   ??   -Bowel regimen   -SCD   -Pain management   -IR consulted for kyphoplasty   -PT/OT eval and treat.     -Appreciate orthopedic surgery input   ??   ??   Dyslipidemia   Restless leg syndrome   Depression   History of DVT/PE (provoked around time of arthroplasty)   History of TIA   Bilateral knee replacements 2016   -Continue Lipitor, Wellbutrin, Lexapro, Neurontin and Requip   -Hold aspirin in anticipation of kyphoplasty   ??      25.0 - 29.9 Overweight  / Body mass index is 26.68 kg/m??.      Estimated discharge date: June 10   Barriers:      Code status: Full   Prophylaxis: SCD's   Recommended Disposition: Home w/Family          Subjective:        Chief Complaint / Reason for Physician Visit   "My back hurts". Following Ms.  Bouse, with PMHx significant for HLD, restless leg syndrome, depression, chronic back pain. Worsened back pain after a fall 1 week ago with T12 compression fracture.  No lower extremity weakness, numbness or bowel or bladder control disturbance.  Discussed with RN events overnight.       Review of Systems:           Symptom  Y/N  Comments    Symptom  Y/N  Comments             Fever/Chills  n      Chest Pain  n               Poor Appetite        Edema                 Cough  Abdominal Pain  n       Sputum        Joint Pain         SOB/DOE  n      Pruritis/Rash         Nausea/vomit        Tolerating PT/OT         Diarrhea        Tolerating Diet                 Constipation        Other               Could NOT obtain due to:            Objective:        VITALS:    Last 24hrs VS reviewed since prior progress note. Most recent are:   Patient Vitals for the past 24 hrs:            Temp  Pulse  Resp  BP  SpO2            12/12/19 0730  98.4 ??F (36.9 ??C)  79  16  (!) 106/58  91 %            12/12/19 0335  98.5 ??F (36.9 ??C)  70  16  100/61  93 %     12/11/19 2324  98.6 ??F (37 ??C)  66  16  101/62  92 %     12/11/19 2024  98.6 ??F (37 ??C)  70  16  110/60  93 %     12/11/19 1951  97.8 ??F (36.6 ??C)  78  15  (!) 103/50  97 %     12/11/19 1900  --  --  --  (!) 96/56  95 %     12/11/19 1800  --  --  --  (!) 101/58  96 %     12/11/19 1737  --  76  --  (!) 103/52  --     12/11/19 1455  --  74  18  (!) 96/54  100 %     12/11/19 1122  --  --  --  --  96 %            12/11/19 1028  97.6 ??F (36.4 ??C)  (!) 102  20  123/79  100 %        No intake or output data in the 24 hours ending 12/12/19 1019       I had a face to face encounter and independently examined this patient on 12/12/2019, as outlined below:   PHYSICAL EXAM:   General: WD, WN. Alert, cooperative, no acute distress     EENT:  EOMI. Anicteric sclerae. MMM   Resp:  CTA bilaterally, no wheezing or rales.  No accessory muscle use   CV:  Regular  rhythm,  No edema   GI:  Soft,  Non distended, Non tender.  +Bowel sounds   Neurologic:  Alert and oriented X 3, normal speech,    Psych:   Fair insight. Not anxious nor agitated   Skin:  No rashes.  No jaundice      Reviewed most current lab test results and cultures  YES   Reviewed most current radiology test results   YES   Review and summation of old records today    Yes   Reviewed patient's current orders and MAR    YES  PMH/SH reviewed - no change compared to H&P   ________________________________________________________________________   Care Plan discussed with:           Comments         Patient  x           Family              RN  x       Care Manager  x           Consultant                                  Multidiciplinary team rounds were held today with case manager, nursing, pharmacist and clinical coordinator.  Patient's plan of care was discussed; medications  were reviewed and discharge planning was addressed.       ________________________________________________________________________   Total NON critical care TIME: 41   Minutes      Total CRITICAL CARE TIME Spent:   Minutes non procedure based              Comments         >50% of visit spent in counseling and coordination of care  x       ________________________________________________________________________   Caryl Bis, MD       Procedures: see electronic medical records for all procedures/Xrays and details which were not copied into this note but were reviewed prior to creation of Plan.        LABS:   I reviewed today's most current labs and imaging studies.   Pertinent labs include:     Recent Labs            12/12/19   0416  12/11/19   1150     WBC  7.8  11.5*     HGB  10.6*  10.1*     HCT  34.8*  32.5*         PLT  211  236          Recent Labs            12/12/19   0416  12/11/19   1150     NA  137  136     K  3.9  4.1     CL  103  104     CO2  32  29     GLU  86  85     BUN  20  23*     CREA  0.73  0.74     CA  8.0*  9.4     ALB  3.2*  3.6     TBILI  0.3   0.4         ALT  21  25           Signed: Caryl Bis, MD

## 2019-12-12 NOTE — Progress Notes (Signed)
Problem: Falls - Risk of  Goal: *Absence of Falls  Description: Document Lindsay [name] Fall Risk and appropriate interventions in the flowsheet.  Outcome: Progressing Towards Goal  Note: Fall Risk Interventions:            Medication Interventions: Patient to call before getting OOB, Teach patient to arise slowly         History of Falls Interventions: Consult care management for discharge planning         Problem: Falls - Risk of  Goal: *Absence of Falls  Description: Document Lindsay [name] Fall Risk and appropriate interventions in the flowsheet.  Outcome: Progressing Towards Goal  Note: Fall Risk Interventions:            Medication Interventions: Patient to call before getting OOB, Teach patient to arise slowly         History of Falls Interventions: Consult care management for discharge planning         Problem: Patient Education: Go to Patient Education Activity  Goal: Patient/Family Education  Outcome: Progressing Towards Goal

## 2019-12-12 NOTE — Progress Notes (Signed)
Pt continues to have active bedrest order.  Will defer and continue to follow.

## 2019-12-12 NOTE — Progress Notes (Signed)
End of Shift Note    Bedside shift change report given to Shanda Bumps, Charity fundraiser (oncoming nurse) by Marliss Czar, RN (offgoing nurse).  Report included the following information SBAR, Kardex, Procedure Summary, Intake/Output, MAR and Recent Results    Shift worked:  night shift     Shift summary and any significant changes:    Patient admitted last night with complaints of severe back pain post fall 12/06/19. IR consult for kyphoplasty. NPO since midnight, last BM 6/3, voiding   Concerns for physician to address:    Zone phone for oncoming shift:  [phone]     Activity:  Activity Level: Up with Assistance  Number times ambulated in hallways past shift: 1  Number of times OOB to chair past shift: 0    Cardiac:   Cardiac Monitoring: No           Access:   Current line(s): PIV     Genitourinary:   Urinary status: voiding    Respiratory:   O2 Device: Nasal cannula  Chronic home O2 use?: NO  Incentive spirometer at bedside: NO     GI:     Current diet:  DIET NPO Sips of Water with Meds  Passing flatus: NO  Tolerating current diet: YES       Pain Management:   Patient states pain is manageable on current regimen: YES    Skin:  Braden Score: 21  Interventions: PT/OT consult    Patient Safety:  Fall Score: Total Score: 2  Interventions: assistive device (walker, cane, etc), gripper socks and pt to call before getting OOB  High Fall Risk: Yes    Length of Stay:  Expected LOS: - - -  Actual LOS: 1      Marliss Czar, RN

## 2019-12-13 ENCOUNTER — Encounter: Attending: Internal Medicine | Primary: Internal Medicine

## 2019-12-13 LAB — HEMOGLOBIN AND HEMATOCRIT
Hematocrit: 31.8 % — ABNORMAL LOW (ref 35.0–47.0)
Hematocrit: 30.2 % — ABNORMAL LOW (ref 35.0–47.0)
Hematocrit: 31.7 % — ABNORMAL LOW (ref 35.0–47.0)
Hemoglobin: 9.7 g/dL — ABNORMAL LOW (ref 11.5–16.0)
Hemoglobin: 9 g/dL — ABNORMAL LOW (ref 11.5–16.0)
Hemoglobin: 9.6 g/dL — ABNORMAL LOW (ref 11.5–16.0)

## 2019-12-13 LAB — HGB & HCT
HCT: 31.8 % — ABNORMAL LOW (ref 35.0–47.0)
HCT: 30.2 % — ABNORMAL LOW (ref 35.0–47.0)
HCT: 31.7 % — ABNORMAL LOW (ref 35.0–47.0)
HGB: 9.7 g/dL — ABNORMAL LOW (ref 11.5–16.0)
HGB: 9 g/dL — ABNORMAL LOW (ref 11.5–16.0)
HGB: 9.6 g/dL — ABNORMAL LOW (ref 11.5–16.0)

## 2019-12-13 MED ORDER — DIAZEPAM 5 MG TAB
5 mg | Freq: Two times a day (BID) | ORAL | Status: DC | PRN
Start: 2019-12-13 — End: 2019-12-13
  Administered 2019-12-13: 14:00:00 via ORAL

## 2019-12-13 MED FILL — ESCITALOPRAM 10 MG TAB: 10 mg | ORAL | Qty: 2

## 2019-12-13 MED FILL — DOK PLUS 8.6 MG-50 MG TABLET: ORAL | Qty: 1

## 2019-12-13 MED FILL — ONDANSETRON (PF) 4 MG/2 ML INJECTION: 4 mg/2 mL | INTRAMUSCULAR | Qty: 2

## 2019-12-13 MED FILL — MAPAP (ACETAMINOPHEN) 325 MG TABLET: 325 mg | ORAL | Qty: 2

## 2019-12-13 MED FILL — LIPITOR 10 MG TABLET: 10 mg | ORAL | Qty: 1

## 2019-12-13 MED FILL — BUPROPION SR 150 MG TAB: 150 mg | ORAL | Qty: 1

## 2019-12-13 MED FILL — ROPINIROLE 1 MG TAB: 1 mg | ORAL | Qty: 3

## 2019-12-13 MED FILL — DIAZEPAM 5 MG TAB: 5 mg | ORAL | Qty: 1

## 2019-12-13 MED FILL — GABAPENTIN 300 MG CAP: 300 mg | ORAL | Qty: 2

## 2019-12-13 MED FILL — OXYCODONE 5 MG TAB: 5 mg | ORAL | Qty: 1

## 2019-12-13 NOTE — Progress Notes (Signed)
Problem: Mobility Impaired (Adult and Pediatric)  Goal: *Acute Goals and Plan of Care (Insert Text)  Note:   PHYSICAL THERAPY EVALUATION/DISCHARGE  Patient: Lindsay [name] (64 y.o. female)  Date: 12/13/2019  Primary Diagnosis: Back pain [M54.9]        Precautions: standard         ASSESSMENT  Based on the objective data described below, the patient presents with decreased activity tolerance, slightly decreased strength and slightly decreased functional mobility skills.  Patient sitting EOB upon arrival.  Agreeable to mobility.  Patient ambulates to bathroom then out into hall.  Patient becomes somewhat anxious when ambulating due to fear of LE's giving out, which happens occasionally due to restless leg syndrome.  Transported patient in wheelchair to gym where patient able to ambulate up and down steps and perform car transfers.  Ambulated full distance back to room.  Prior to admission patient was independent with all mobility; lives with husband.  No further PT recommended at this time.    Functional Outcome Measure:  The patient scored 95/100 on the Barthel Index outcome measure which is indicative of 5% decline in mobility..      Other factors to consider for discharge: current medication side effects     Further skilled acute physical therapy is not indicated at this time.     PLAN :  Recommendation for discharge: (in order for the patient to meet his/her long term goals)  No skilled physical therapy/ follow up rehabilitation needs identified at this time.    This discharge recommendation:  Has been made in collaboration with the attending provider and/or case management    IF patient discharges home will need the following DME: none       SUBJECTIVE:   Patient stated ???I'm anxious to go home.???    OBJECTIVE DATA SUMMARY:   HISTORY:    Past Medical History:   Diagnosis Date    Abnormal brain scan     Anxiety disorder     Arthritis     Chronic pain     Depression     DVT (deep venous thrombosis) (HCC)     Falls  frequently     Fracture of left ankle     Headache(784.0) 2011    Ill-defined condition 09/15/2015    FX R HAND    Neurocutaneous syndrome (HCC)     Other and unspecified hyperlipidemia 12/27/2012    PE (pulmonary embolism) 12/2012    RLS (restless legs syndrome) 03/16/2016    Dr Scarlett Presto    TIA (transient ischemic attack) 03/2016    Right facial droop, numbness in lip    Torn rotator cuff     right     Past Surgical History:   Procedure Laterality Date    HX APPENDECTOMY      HX CESAREAN SECTION      x4    HX GI      COLONOSCOPY    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 shoulder    HX ORTHOPAEDIC      left ankle fracture treated with air cast    HX ORTHOPAEDIC  6/13    left shoulder fxt and dislocation    HX OTHER SURGICAL  2010    left foot torn tendon    HX TUBAL LIGATION      IMPLANT BREAST SILICONE/EQ  2002    Bilateral breast implants    IR KYPHOPLASTY THORACIC  12/12/2019    PR BREAST SURGERY PROCEDURE UNLISTED  2002    AUGMENTATION       Prior level of function: independent with all mobility  Personal factors and/or comorbidities impacting plan of care: restless leg syndrome    Home Situation  Home Environment: Private residence  # Steps to Enter: 3  Rails to Enter: Yes  One/Two Story Residence: Two story, live on 1st floor  Living Alone: No  Support Systems: Family member(s)  Patient Expects to be Discharged to:: House  Current DME Used/Available at Home: Gilmer Mor, straight, Environmental consultant, rolling, Paediatric nurse, Grab bars (air cast, Sports administrator)  Tub or Shower Type: Shower    EXAMINATION/PRESENTATION/DECISION MAKING:   Critical Behavior:  Neurologic State: Alert, Appropriate for age  Orientation Level: Oriented X4  Cognition: Follows commands  Safety/Judgement: Awareness of environment, Fall prevention, Home safety, Insight into deficits  Hearing:  Auditory  Auditory Impairment: None  Skin:  intact  Edema: none noted  Range Of Motion:  AROM: Within functional limits            PROM: Within functional limits           Strength:    Strength: Within functional limits                    Tone & Sensation:   Tone: Normal              Sensation: Intact               Coordination:  Coordination: Within functional limits  Vision:   Corrective Lenses: Reading glasses  Functional Mobility:  Bed Mobility:  Rolling: Independent  Supine to Sit: Independent     Scooting: Independent  Transfers:  Sit to Stand: Independent  Stand to Sit: Independent        Bed to Chair: Independent              Balance:   Sitting: Intact  Standing: Intact  Ambulation/Gait Training:  Distance (ft): 225 Feet (ft) (75', 150')  Assistive Device: Gait belt  Ambulation - Level of Assistance: Stand-by assistance        Gait Abnormalities: Decreased step clearance  Right Side Weight Bearing: Full  Left Side Weight Bearing: Full           Step Length: Left shortened;Right shortened                     Stairs:  Number of Stairs Trained: 4  Stairs - Level of Assistance: Contact guard assistance   Rail Use: Both    Functional Measure:  Barthel Index:    Bathing: 0  Bladder: 10  Bowels: 10  Grooming: 5  Dressing: 10  Feeding: 10  Mobility: 15  Stairs: 10  Toilet Use: 10  Transfer (Bed to Chair and Back): 15  Total: 95/100       The Barthel ADL Index: Guidelines  1. The index should be used as a record of what a patient does, not as a record of what a patient could do.  2. The main aim is to establish degree of independence from any help, physical or verbal, however minor and for whatever reason.  3. The need for supervision renders the patient not independent.  4. A patient's performance should be established using the best available evidence. Asking the patient, friends/relatives and nurses are the usual sources, but direct observation and common sense are also important. However direct testing is not  needed.  5. Usually the patient's performance over the preceding 24-48 hours is important, but occasionally longer periods will be  relevant.  6. Middle categories imply that the patient supplies over 50 per cent of the effort.  7. Use of aids to be independent is allowed.    Daneen Schick., Barthel, D.W. [phone]). Functional evaluation: the Barthel Index. [place] (14)2.  Lucianne Lei der [place], J.J.M.F, [place], Diona Browner., Oris Drone., [place], [place] (1999). Measuring the change indisability after inpatient rehabilitation; comparison of the responsiveness of the Barthel Index and Functional Independence Measure. Journal of Neurology, Neurosurgery, and Psychiatry, 66(4), [phone].  Wilford Sports, N.J.A, [place],  W.J.M, & [place], M.A. (2004.) Assessment of post-stroke quality of life in cost-effectiveness studies: The usefulness of the Barthel Index and the EuroQoL-5D. Quality of Life Research, 33, 427-43            Physical Therapy Evaluation Charge Determination   History Examination Presentation Decision-Making   MEDIUM  Complexity : 1-2 comorbidities / personal factors will impact the outcome/ POC  MEDIUM Complexity : 3 Standardized tests and measures addressing body structure, function, activity limitation and / or participation in recreation  LOW Complexity : Stable, uncomplicated  LOW Complexity : FOTO score of 75-100      Based on the above components, the patient evaluation is determined to be of the following complexity level: LOW     Pain Rating:  7/10    Activity Tolerance:   Good      After treatment patient left in no apparent distress:   Sitting in chair and Call bell within reach    COMMUNICATION/EDUCATION:   The patient???s plan of care was discussed with: Occupational therapist and Registered nurse.     Fall prevention education was provided and the patient/caregiver indicated understanding. and Patient/family agree to work toward stated goals and plan of care.    Thank you for this referral.  Veverly Fells, PT   Time Calculation: 23 mins

## 2019-12-13 NOTE — Progress Notes (Signed)
 Transition of Care Plan:  RUR: 9%  Disposition: home with family assistance   Follow up appointments: neurosurgery   DME needed: n/a  Transportation at Discharge: daughter- at bedside   Keys or means to access home: daughter to provide    IM Medicare letter: n/a  Caregiver Contact: daughter   Discharge Caregiver contacted prior to discharge? yes        Reason for Admission:  Back pain                    RUR Score:  9%                   Plan for utilizing home health: per recommendation          PCP: First and Last name:  Rowland Dorien CROME, MD   [facility]    Are you a current patient: Yes/No: yes   Approximate date of last visit: 10/07/19   Can you participate in a virtual visit with your PCP: yes                    Current Advanced Directive/Advance Care Plan: Advance Care Planning     General Advance Care Planning (ACP) Conversation      Date of Conversation: 12/13/19  Conducted with: Patient with Decision Making Capacity    Content/Action Overview:   DECLINED ACP conversation - will revisit periodically     Healthcare Decision Maker:   Click here to complete Diplomatic Services operational officer including selection of the Healthcare Decision Maker Relationship (ie Primary)                        Transition of Care Plan:                      CM made room visit with patient who was alert and oriented with daughter at bedside. Pt confirmed demographics, insurance, and emergency contact on file. Pt and husband live in a 3 story home with 3 ste. Pt has children that live [place] the same farm property as pt and very supportive. At baseline pt is independent with ADLs and driving. Pt has access to a RW and wheelchair. Pt has used HH years ago and has no hx of SNF/IPR.     Pt's plan is to d/c home with family assistance. Pt has no therapy needs. Dtr at bedside to transport pt home at d/c.    Care Management Interventions  PCP Verified by CM: Yes  Last Visit to PCP: 10/07/19  Mode of Transport at  Discharge: Other (see comment) (daughter)  Transition of Care Consult (CM Consult): Discharge Planning  Discharge Durable Medical Equipment: No  Physical Therapy Consult: Yes  Occupational Therapy Consult: Yes  Speech Therapy Consult: No  Current Support Network: Lives with Spouse, Own Home, Family Lives Nearby (Pt and husband live in a 3 story home with 3 ste. Pt has children that live on the same farm property as pt and very supportive)  Confirm Follow Up Transport: Family  Discharge Location  Discharge Placement: Home with family assistance    Isaiah Mandril, [place], [place]  [phone]

## 2019-12-13 NOTE — Progress Notes (Signed)
OCCUPATIONAL THERAPY EVALUATION/DISCHARGE  Patient: Lindsay [name] (64 y.o. female)  Date: 12/13/2019  Primary Diagnosis: Back pain [M54.9]       Precautions: none       ASSESSMENT  Based on the objective data described below, the patient presents with report of improved pain since kyphoplasty.  She was able to perform crossed leg technique for LB dressing, toileting in bathroom and grooming at the sink with independence.  Educated pt on pacing and taking her time initially with return to home.  She reports having a history of restless leg syndrome and that her legs give way at random.  Educated pt on having a shower chair and on stepping laterally over tub with stronger leg first.  She reports having shower chair at home and will use her walk in shower initially.  She reported that her husband is supportive and can assist.  Further OT services are not needed at this time and pt is in agreement.     Current Level of Function (ADLs/self-care): Independent for mobility without assist devices and independent with all ADLs except for bathing (modified independent)    Functional Outcome Measure: The patient scored 95/100 on the barthel outcome measure which is indicative of minimal deficits with mobility and ADLS.      Other factors to consider for discharge: none     PLAN :      Recommendation for discharge: (in order for the patient to meet his/her long term goals)  No skilled occupational therapy/ follow up rehabilitation needs identified at this time.    This discharge recommendation:  Has been made in collaboration with the attending provider and/or case management    IF patient discharges home will need the following DME: none       SUBJECTIVE:   Patient stated ???I feel better.  When can I go home????    OBJECTIVE DATA SUMMARY:   HISTORY:   Past Medical History:   Diagnosis Date   ??? Abnormal brain scan    ??? Anxiety disorder    ??? Arthritis    ??? Chronic pain    ??? Depression    ??? DVT (deep venous thrombosis) (HCC)    ???  Falls frequently    ??? Fracture of left ankle    ??? Headache(784.0) 2011   ??? Ill-defined condition 09/15/2015    FX R HAND   ??? 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 Other and unspecified hyperlipidemia 12/27/2012   ??? PE (pulmonary embolism) 12/2012   ??? RLS (restless legs syndrome) 03/16/2016    Dr Scarlett Presto   ??? TIA (transient ischemic attack) 03/2016    Right facial droop, numbness in lip   ??? Torn rotator cuff     right     Past Surgical History:   Procedure Laterality Date   ??? HX APPENDECTOMY     ??? HX CESAREAN SECTION      x4   ??? HX GI      COLONOSCOPY   ??? 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 HX ORTHOPAEDIC  08/2004    rotater cuff r. shoulder   ??? HX ORTHOPAEDIC      left ankle fracture treated with air cast   ??? HX ORTHOPAEDIC  6/13    left shoulder fxt and dislocation   ??? HX OTHER SURGICAL  2010    left foot torn tendon   ??? HX TUBAL LIGATION     ??? IMPLANT BREAST SILICONE/EQ  2002  Bilateral breast implants   ??? IR KYPHOPLASTY THORACIC  12/12/2019   ??? PR BREAST SURGERY PROCEDURE UNLISTED  2002    AUGMENTATION       Prior Level of Function/Environment/Context: recent falls due to back pain, prior to this pt was performing all ADLS and IADLS without assist  Expanded or extensive additional review of patient history:   Home Situation  Home Environment: Private residence  # Steps to Enter: 3  Rails to Enter: Yes  One/Two Story Residence: Two story, live on 1st floor  Living Alone: No  Support Systems: Family member(s)  Patient Expects to be Discharged to:: House  Current DME Used/Available at Home: Kasandra Knudsen, straight, Environmental consultant, rolling, Civil engineer, contracting, Grab bars (air cast, Secondary school teacher)  Tub or Shower Type: Shower    Hand dominance: Right    EXAMINATION OF PERFORMANCE DEFICITS:  Cognitive/Behavioral Status:           Perception: Appears intact  Perseveration: No perseveration noted  Safety/Judgement: Awareness of environment;Fall prevention;Home safety;Insight into deficits      Hearing:  Auditory  Auditory Impairment:  None    Vision/Perceptual:                                Corrective Lenses: Reading glasses    Range of Motion:  WFL                            Strength:  Generally decreased but functional                   Coordination:     Fine Motor Skills-Upper: Left Intact;Right Intact    Gross Motor Skills-Upper: Left Intact;Right Intact                            Balance:  Sitting: Intact  Standing: Intact    Functional Mobility and Transfers for ADLs:  Bed Mobility:  Rolling: Independent  Supine to Sit: Independent  Scooting: Independent    Transfers:  Sit to Stand: Independent  Stand to Sit: Independent  Bed to Chair: Independent  Bathroom Mobility: Independent  Armed forces technical officer : Independent    ADL Assessment:  Feeding: Independent    Oral Facial Hygiene/Grooming: Independent    Bathing: Modified independent (pain related)    Upper Body Dressing: Independent    Lower Body Dressing: Independent    Toileting: Independent                ADL Intervention and task modifications:     See assessment    Cognitive Retraining  Safety/Judgement: Awareness of environment;Fall prevention;Home safety;Insight into deficits      Functional Measure:  Barthel Index:    Bathing: 0  Bladder: 10  Bowels: 10  Grooming: 5  Dressing: 10  Feeding: 10  Mobility: 15  Stairs: 10  Toilet Use: 10  Transfer (Bed to Chair and Back): 15  Total: 95/100        The Barthel ADL Index: Guidelines  1. The index should be used as a record of what a patient does, not as a record of what a patient could do.  2. The main aim is to establish degree of independence from any help, physical or verbal, however minor and for whatever reason.  3. The need for supervision renders the patient not independent.  4. A patient's performance  should be established using the best available evidence. Asking the patient, friends/relatives and nurses are the usual sources, but direct observation and common sense are also important. However direct testing is not needed.  5. Usually  the patient's performance over the preceding 24-48 hours is important, but occasionally longer periods will be relevant.  6. Middle categories imply that the patient supplies over 50 per cent of the effort.  7. Use of aids to be independent is allowed.    Clarisa Kindred., Barthel, D.W. [phone]). Functional evaluation: the Barthel Index. Md State Med J (14)2.  Zenaida Niece der [place], J.J.M.F, [place], Ian Malkin., Margret Chance., [place], [place]. (1999). Measuring the change indisability after inpatient rehabilitation; comparison of the responsiveness of the Barthel Index and Functional Independence Measure. Journal of Neurology, Neurosurgery, and Psychiatry, 66(4), [phone].  Dawson Bills, N.J.A, [place],  W.J.M, & Koop[place]chap, M.A. (2004.) Assessment of post-stroke quality of life in cost-effectiveness studies: The usefulness of the Barthel Index and the EuroQoL-5D. Quality of Life Research, 45, 885-02         Occupational Therapy Evaluation Charge Determination   History Examination Decision-Making   LOW Complexity : Brief history review  LOW Complexity : 1-3 performance deficits relating to physical, cognitive , or psychosocial skils that result in activity limitations and / or participation restrictions  LOW Complexity : No comorbidities that affect functional and no verbal or physical assistance needed to complete eval tasks       Based on the above components, the patient evaluation is determined to be of the following complexity level: LOW   Pain Rating:  3/10 lower back    Activity Tolerance:   Fair    After treatment patient left in no apparent distress:    Sitting in chair and Call bell within reach    COMMUNICATION/EDUCATION:   The patient???s plan of care was discussed with: Physical therapist, Registered nurse and patient.     Thank you for this referral.  Arlys John, OTR/L  Time Calculation: 15 mins

## 2019-12-13 NOTE — Progress Notes (Signed)
Pharmacist Discharge Medication Reconciliation    Significant PMH:   Past Medical History:   Diagnosis Date    Abnormal brain scan     Anxiety disorder     Arthritis     Chronic pain     Depression     DVT (deep venous thrombosis) (HCC)     Falls frequently     Fracture of left ankle     Headache(784.0) 2011    Ill-defined condition 09/15/2015    FX R HAND    Neurocutaneous syndrome ([place])     Other and unspecified hyperlipidemia 12/27/2012    PE (pulmonary embolism) 12/2012    RLS (restless legs syndrome) 03/16/2016    Dr Roseanne Reno    TIA (transient ischemic attack) 03/2016    Right facial droop, numbness in lip    Torn rotator cuff     right     Chief Complaint for this Admission:   Chief Complaint   Patient presents with    Back Pain     She fell on Thursday and came in here to look at her back. She woke up this morning at 0300 and has taken a muscle relaxant and pain pill. Her restless leg syndrome had gotten so bad this morning that she can't hold still. she thinks it is related to the back pain and fall because her normal meds are not helping.     Allergies: Patient has no known allergies.    Discharge Medications:   Current Discharge Medication List        CONTINUE these medications which have NOT CHANGED    Details   cyclobenzaprine (FLEXERIL) 10 mg tablet Take 1 Tablet by mouth three (3) times daily as needed for Muscle Spasm(s).  Qty: 21 Tablet, Refills: 0      lidocaine 4 % patch 1 Patch by TransDERmal route every twelve (12) hours every twelve (12) hours.  Qty: 5 Patch, Refills: 2      atorvastatin (LIPITOR) 10 mg tablet Take 1 Tab by mouth daily.  Qty: 30 Tab, Refills: 3    Associated Diagnoses: Dyslipidemia, goal LDL below 100      gabapentin enacarbil (Horizant) 600 mg TbER Take 600 mg by mouth two (2) times a day. Max Daily Amount: 1,200 mg.  Qty: 180 Tab, Refills: 0    Associated Diagnoses: RLS (restless legs syndrome)      rOPINIRole (REQUIP) 1 mg tablet TAKE 1 TABLET AT 1-2PM AND TAKE 3 TABLETS BY  MOUTH 1 TO 3 HOURS BEFORE BEDTIME EVERYDAY  Qty: 360 Tab, Refills: 0      diazePAM (VALIUM) 5 mg tablet 5 mg every eight (8) hours as needed.      buPROPion XL (WELLBUTRIN XL) 150 mg tablet 150 mg two (2) times a day. Take 1 tab BID  Refills: 0      aspirin 81 mg chewable tablet Take 1 Tab by mouth daily.  Qty: 30 Tab, Refills: 0      ergocalciferol (ERGOCALCIFEROL) 50,000 unit capsule Take 1 Cap by mouth every seven (7) days.  Qty: 12 Cap, Refills: 1    Associated Diagnoses: Vitamin deficiency      escitalopram oxalate (LEXAPRO) 20 mg tablet Take 1 Tab by mouth daily.  Qty: 30 Tab, Refills: 5      omeprazole (PRILOSEC) 20 mg capsule Take 20 mg by mouth nightly.           STOP taking these medications       oxyCODONE  IR (Roxicodone) 5 mg immediate release tablet Comments:   Reason for Stopping:         predniSONE (DELTASONE) 20 mg tablet Comments:   Reason for Stopping:         ibuprofen (MOTRIN) 600 mg tablet Comments:   Reason for Stopping:               The patient's chart, MAR and AVS were reviewed by Rodena Piety, RPH.    Discharging Provider: Donnetta Hail, MD    Thank you,     Rodena Piety, [facility]

## 2019-12-13 NOTE — Progress Notes (Signed)
DISCHARGE NOTE FROM [facility]    Patient determined to be stable for discharge by attending provider. I have reviewed the discharge instructions with the patient. They verbalized understanding and all questions were answered to their satisfaction. No complaints or further questions were expressed.      No new medication scripts.  Appropriate educational materials and medication side effect teaching were provided.      PIV were removed prior to discharge.     Patient did not discharge with any line, foley, or drain.     Personal items and valuables accounted for at discharge by patient and/or family: YES    Post-op patient: No      Benard Rink, RN

## 2019-12-13 NOTE — Discharge Summary (Signed)
Discharge Summary by Caryl Bis, MD at 12/13/19 1018                Author: Caryl Bis, MD  Service: Internal Medicine  Author Type: Physician       Filed: 12/13/19 1022  Date of Service: 12/13/19 1018  Status: Signed          Editor: Caryl Bis, MD (Physician)               .                           Hospitalist Discharge Summary        Patient ID:   Lindsay [name]   [id number]   64 y.o.   1956-03-20   12/11/2019      PCP on record: Clarene Critchley, MD      Admit date: 12/11/2019   Discharge date and time: 12/13/2019      DISCHARGE DIAGNOSIS:      Acute back pain due to acute T12 compression fracture, s/p fall   S/p kyphoplasty??   Dyslipidemia   Restless leg syndrome   Depression   History of DVT/PE (provoked around time of arthroplasty)   History of TIA   Bilateral knee replacements 2016   ??      CONSULTATIONS:   IP CONSULT TO ORTHOPEDIC SURGERY   IP CONSULT TO INTERVENTIONAL RADIOLOGY   IP CONSULT TO INTERVENTIONAL RADIOLOGY   IP CONSULT TO HOSPITALIST      Excerpted HPI from H&P of Pamalee Leyden, MD:   "Lindsay [name] is a 64 y.o. female with a history of DVT/PE, TIA who presents with worsening back pain.  She had a recent fall at home as she was coming down a step stool and  landed directly onto her lower back.  She denied head trauma or loss of consciousness.  She was evaluated in the ED,6/03 and diagnosed with a T12 compression fracture, seen on CT of her lumbar spine.  She was discharged with oxycodone, Flexeril and  prednisone.  Today he presented to the ED because her restless leg syndrome seemed worse and she was concerned it may be related to her recent fall.  Because of a concern for cord compression, MRI of the lumbar spine was completed with the above results.   Patient was seen by orthopedics and recommended kyphoplasty.   ??"   ______________________________________________________________________   DISCHARGE SUMMARY/HOSPITAL COURSE:   for full details see H&P, daily  progress notes, labs, consult notes.          Acute back pain due to acute T12 compression fracture, s/p fall   -CT Lumbar spine   IMPRESSION   1. Acute compression fracture at T12, with mild retropulsion of bone but no   significant compression of the conus medullaris or cauda is obvious. No   significant associated hematoma, epidural or paraspinal.    2. Nonacute superior endplate depression at L4.   3. Multilevel degenerative change.   ??   -MRI Lumbar spine   IMPRESSION   1. ??Increase loss of height at T12 with increased bony retropulsion and moderate   severe canal stenosis. Possible compression on the cord although no abnormal   cord signal is identified.   2. ??Other degenerative changes in the lumbar spine as described   3. ??Mild superior endplate compression deformity of L4 with a chronic   ??   -Bowel regimen   -  SCD   -Pain management   -IR consulted for kyphoplasty, done 6/9, post procedure suffered from mild hypotension, improved after a small IV fluid bolus. Observed over night, hemodynamics were stable and Hgb was stable.    -PT/OT evaluated with no further needs      She is ambulating very well, No lower extremity weakness, numbness or bowel or bladder control disturbance.      -Appreciate orthopedic??surgery??input   ??   ??   Dyslipidemia   Restless leg syndrome   Depression   History of DVT/PE (provoked around time of arthroplasty)   History of TIA   Bilateral knee replacements 2016   -Continue Lipitor, Wellbutrin,??Lexapro, Neurontin and??Requip   -Hold aspirin in anticipation of kyphoplasty   ??         _______________________________________________________________________   Patient seen and examined by me on discharge day.   Pertinent Findings:   Gen:    Not in distress   Chest: Clear lungs   CVS:   Regular rhythm.  No edema   Abd:  Soft, not distended, not tender   Neuro:  Alert, oriented, no gross deficit   _______________________________________________________________________   DISCHARGE MEDICATIONS:       Current Discharge Medication List              CONTINUE these medications which have NOT CHANGED          Details        !! diazePAM (VALIUM) 10 mg tablet  Take 10 mg by mouth nightly.               cyclobenzaprine (FLEXERIL) 10 mg tablet  Take 1 Tablet by mouth three (3) times daily as needed for Muscle Spasm(s).   Qty: 21 Tablet, Refills:  0               lidocaine 4 % patch  1 Patch by TransDERmal route every twelve (12) hours every twelve (12) hours.   Qty: 5 Patch, Refills:  2               atorvastatin (LIPITOR) 10 mg tablet  Take 1 Tab by mouth daily.   Qty: 30 Tab, Refills:  3          Associated Diagnoses: Dyslipidemia, goal LDL below 100               gabapentin enacarbil (Horizant) 600 mg TbER  Take 600 mg by mouth two (2) times a day. Max Daily Amount: 1,200 mg.   Qty: 180 Tab, Refills:  0          Associated Diagnoses: RLS (restless legs syndrome)               rOPINIRole (REQUIP) 1 mg tablet  TAKE 1 TABLET AT 1-2PM AND TAKE 3 TABLETS BY MOUTH 1 TO 3 HOURS BEFORE BEDTIME EVERYDAY   Qty: 360 Tab, Refills:  0               !! diazePAM (VALIUM) 5 mg tablet  diazepam 5 mg tablet               buPROPion XL (WELLBUTRIN XL) 150 mg tablet  150 mg two (2) times a day. Take 1 tab BID   Refills: 0               aspirin 81 mg chewable tablet  Take 1 Tab by mouth daily.   Qty: 30 Tab, Refills:  0  ergocalciferol (ERGOCALCIFEROL) 50,000 unit capsule  Take 1 Cap by mouth every seven (7) days.   Qty: 12 Cap, Refills:  1          Associated Diagnoses: Vitamin deficiency               escitalopram oxalate (LEXAPRO) 20 mg tablet  Take 1 Tab by mouth daily.   Qty: 30 Tab, Refills:  5               omeprazole (PRILOSEC) 20 mg capsule  Take 20 mg by mouth nightly.               !! - Potential duplicate medications found. Please discuss with provider.              STOP taking these medications                  oxyCODONE IR (Roxicodone) 5 mg immediate release tablet  Comments:    Reason for Stopping:                       predniSONE (DELTASONE) 20 mg tablet  Comments:    Reason for Stopping:                      ibuprofen (MOTRIN) 600 mg tablet  Comments:    Reason for Stopping:                                Patient Follow Up Instructions:    Activity: No driving while on analgesics   Diet: Regular Diet   Wound Care: Keep wound clean and dry              Follow-up Information               Follow up With  Specialties  Details  Why  Contact Info              Clarene Critchley, MD  Internal Medicine  In 5 days    [address] [facility]   [address]   [place] [address]   [phone]                ________________________________________________________________      Risk of deterioration: Moderate      Condition at Discharge:  Stable   __________________________________________________________________      Disposition   Home with family, no needs      ____________________________________________________________________      Code Status: Full Code   ___________________________________________________________________         Total time in minutes spent coordinating this discharge (includes going over instructions, follow-up, prescriptions, and preparing report for sign off to her PCP) :  >30 minutes      Signed:   Caryl Bis, MD    .

## 2019-12-13 NOTE — Progress Notes (Signed)
End of Shift Note    Bedside shift change report given to Sam, C (oncoming nurse) by Steward Drone (offgoing nurse).  Report included the following information SBAR, Kardex, Intake/Output, MAR and Accordion    Shift worked:  1900-0700     Shift summary and any significant changes:     Patient resting in bed, PRN medication given x1. Patient received zofran for nausea, she is not c/o of nausea at the moment.      Concerns for physician to address:       Zone phone for oncoming shift:   [phone]       Activity:  Activity Level: Up with Assistance  Number times ambulated in hallways past shift: 0  Number of times OOB to chair past shift: 0    Cardiac:   Cardiac Monitoring: No      Cardiac Rhythm: Sinus Rhythm    Access:   Current line(s): PIV     Genitourinary:   Urinary status: voiding    Respiratory:   O2 Device: Nasal cannula  Chronic home O2 use?: NO  Incentive spirometer at bedside: NO     GI:     Current diet:  DIET NPO Sips of Water with Meds  Passing flatus: YES  Tolerating current diet: NPO       Pain Management:   Patient states pain is manageable on current regimen: YES    Skin:  Braden Score: 21  Interventions: increase time out of bed    Patient Safety:  Fall Score: Total Score: 2  Interventions: assistive device (walker, cane, etc), gripper socks and pt to call before getting OOB  High Fall Risk: Yes    Length of Stay:  Expected LOS: 2d 21h  Actual LOS: 2      Lindsay [name]

## 2019-12-13 NOTE — Progress Notes (Signed)
Spoke to MD [facility] (Radiologist) about consult this morning. Spoke with MD Alcharif, informed patient received fluid during case, monitor HGB and monitor for bleeding at procedure site.

## 2019-12-16 ENCOUNTER — Emergency Department: Admit: 2019-12-16 | Payer: MEDICARE | Primary: Internal Medicine

## 2019-12-16 ENCOUNTER — Inpatient Hospital Stay
Admit: 2019-12-16 | Discharge: 2019-12-16 | Disposition: A | Discharge status: Home / Self Care | Payer: MEDICARE | Attending: Emergency Medicine

## 2019-12-16 DIAGNOSIS — R1031 Right lower quadrant pain: Secondary | ICD-10-CM

## 2019-12-16 MED ORDER — LIDOCAINE 4 % TOPICAL PATCH (12 HOUR DURATION)
4 % | CUTANEOUS | Status: DC
Start: 2019-12-16 — End: 2019-12-16

## 2019-12-16 MED ORDER — METHOCARBAMOL 750 MG TAB
750 mg | ORAL_TABLET | Freq: Four times a day (QID) | ORAL | 0 refills | Status: AC | PRN
Start: 2019-12-16 — End: 2021-04-10

## 2019-12-16 MED FILL — SALONPAS (LIDOCAINE) 4 % TOPICAL PATCH: 4 % | CUTANEOUS | Qty: 1

## 2019-12-16 NOTE — ED Provider Notes (Signed)
ED Provider Notes by Seward Speck, MD at 12/16/19 1533                Author: Seward Speck, MD  Service: Emergency Medicine  Author Type: Physician       Filed: 12/16/19 1718  Date of Service: 12/16/19 1533  Status: Signed          Editor: Seward Speck, MD (Physician)               EMERGENCY DEPARTMENT HISTORY AND PHYSICAL EXAM           Date: 12/16/2019   Patient Name: Lindsay [name]        History of Presenting Illness          Chief Complaint       Patient presents with        ?  Back Pain             Following GLF on 6/1.  Pt was seen on 6/1 and dx with broken vertebrae.  Pt was at [facility] last week Mon-Thurs and returning with worse pain now to  the lower back hip Right side.  Pt reports difficulty with BM.  Pt has been taking tylenol with temp relief.             History Provided By: Patient and Medical records      HPI: Lindsay [name],  64 y.o. female presents to the ED with cc of bilateral hip and right groin pain.  Patient  suffered a mechanical fall on June 3.  She was diagnosed with acute compression fracture of T12 at that time.  She had worsening back pain and returned on June 8.  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  Possible compression on the cord, although no abnormal cord signal was identified.  She was admitted, she was evaluated by orthopedics and her MRI was reviewed by them.  They recommended IR consult  for kyphoplasty.  Kyphoplasty was performed on June 9.  Patient states that her back pain has resolved.  Over the last few days, she has noted that whenever she tried to have a bowel movement, she had severe pain in both buttocks.  When she moves in certain  positions the pain is  exacerbated.  Currently, her pain is 8 out of 10 in severity.  Tylenol does help relieve the symptoms.  She denies any numbness or tingling.  She denies incontinence, dysuria, chest pain, cough  or shortness of breath.      There are no other complaints, changes, or physical findings at this time.      PCP: Flora Lipps, MD        No current facility-administered medications on file prior to encounter.          Current Outpatient Medications on File Prior to Encounter          Medication  Sig  Dispense  Refill           ?  cyclobenzaprine (FLEXERIL) 10 mg tablet  Take 1 Tablet by mouth three (3) times daily as needed for Muscle Spasm(s).  21 Tablet  0     ?  lidocaine 4 % patch  1 Patch by TransDERmal route every twelve (12) hours every twelve (12) hours.  5 Patch  2     ?  atorvastatin (LIPITOR) 10  mg tablet  Take 1 Tab by mouth daily.  30 Tab  3     ?  gabapentin enacarbil (Horizant) 600 mg TbER  Take 600 mg by mouth two (2) times a day. Max Daily Amount: 1,200 mg.  180 Tab  0     ?  rOPINIRole (REQUIP) 1 mg tablet  TAKE 1 TABLET AT 1-2PM AND TAKE 3 TABLETS BY MOUTH 1 TO 3 HOURS BEFORE BEDTIME EVERYDAY  360 Tab  0     ?  diazePAM (VALIUM) 5 mg tablet  5 mg every eight (8) hours as needed.         ?  buPROPion XL (WELLBUTRIN XL) 150 mg tablet  150 mg two (2) times a day. Take 1 tab BID    0     ?  aspirin 81 mg chewable tablet  Take 1 Tab by mouth daily.  30 Tab  0     ?  ergocalciferol (ERGOCALCIFEROL) 50,000 unit capsule  Take 1 Cap by mouth every seven (7) days.  12 Cap  1     ?  escitalopram oxalate (LEXAPRO) 20 mg tablet  Take 1 Tab by mouth daily.  30 Tab  5           ?  omeprazole (PRILOSEC) 20 mg capsule  Take 20 mg by mouth nightly.                 Past History        Past Medical History:     Past Medical History:        Diagnosis  Date         ?  Abnormal brain scan       ?  Anxiety disorder       ?  Arthritis       ?  Chronic pain       ?  Depression       ?  DVT (deep venous thrombosis) (HCC)       ?  Falls frequently       ?  Fracture of left ankle       ?  TXMIWOEH(212.2)  2011     ?  Ill-defined condition  09/15/2015          FX R HAND         ?  Neurocutaneous syndrome (HCC)       ?   Other and unspecified hyperlipidemia  12/27/2012     ?  PE (pulmonary embolism)  12/2012     ?  RLS (restless legs syndrome)  03/16/2016          Dr Scarlett Presto         ?  TIA (transient ischemic attack)  03/2016          Right facial droop, numbness in lip         ?  Torn rotator cuff            right           Past Surgical History:     Past Surgical History:         Procedure  Laterality  Date          ?  HX APPENDECTOMY         ?  HX CESAREAN SECTION              x4          ?  HX  GI              COLONOSCOPY          ?  HX KNEE REPLACEMENT  Left  10/29/2015     ?  HX ORTHOPAEDIC    08/2004          rotater cuff r. shoulder          ?  HX ORTHOPAEDIC              left ankle fracture treated with air cast          ?  HX ORTHOPAEDIC    6/13          left shoulder fxt and dislocation          ?  HX OTHER SURGICAL    2010          left foot torn tendon          ?  HX TUBAL LIGATION         ?  IMPLANT BREAST SILICONE/EQ    2002          Bilateral breast implants          ?  IR KYPHOPLASTY THORACIC    12/12/2019     ?  PR BREAST SURGERY PROCEDURE UNLISTED    2002          AUGMENTATION           Family History:     Family History         Problem  Relation  Age of Onset          ?  Heart Disease  Mother       ?  Heart Disease  Father       ?  Cancer  Father                thyroid cancer          ?  Arthritis-rheumatoid  Sister       ?  No Known Problems  Son       ?  No Known Problems  Daughter       ?  No Known Problems  Daughter       ?  No Known Problems  Daughter            ?  Anesth Problems  Neg Hx             Social History:     Social History          Tobacco Use         ?  Smoking status:  Never Smoker     ?  Smokeless tobacco:  Never Used       Substance Use Topics         ?  Alcohol use:  No             Comment: Pt denies         ?  Drug use:  No           Allergies:   No Known Allergies           Review of Systems     Review of Systems    Constitutional: Negative for fever.    HENT: Negative for congestion.      Eyes: Negative.     Respiratory: Negative for shortness of breath.     Cardiovascular: Negative for chest pain.    Gastrointestinal:  Positive for abdominal pain.    Endocrine: Negative for heat intolerance.    Genitourinary: Negative.     Musculoskeletal: Negative for back pain.    Skin: Negative for rash.    Allergic/Immunologic: Negative for immunocompromised state.    Neurological: Negative for dizziness.    Hematological: Does not bruise/bleed easily.    Psychiatric/Behavioral: Negative.     All other systems reviewed and are negative.           Physical Exam     Physical Exam   Vitals and nursing note reviewed.   Constitutional:        General: She is not in acute distress.     Appearance: She is well-developed.    HENT:       Head: Normocephalic.   Cardiovascular :       Rate and Rhythm: Normal rate and regular rhythm.      Pulses: Normal pulses.      Heart sounds: Normal heart sounds.    Pulmonary:       Effort: Pulmonary effort is normal.      Breath sounds: Normal breath sounds.   Abdominal :      General: Bowel sounds are normal.      Palpations: Abdomen is soft.      Tenderness: There is abdominal tenderness.       Comments: Right groin and mild right lower quadrant tenderness     Musculoskeletal:          General: Tenderness present. Normal range of motion.      Cervical back: Normal range of motion and neck supple.      Comments:  bilateral hip tenderness    Skin:      General: Skin is warm and dry.   Neurological :       General: No focal deficit present.      Mental Status: She is alert and oriented to person, place, and time.    Psychiatric:         Mood and Affect: Mood normal.         Behavior: Behavior normal.               Diagnostic Study Results        Labs -    No results found for this or any previous visit (from the past 12 hour(s)).      Radiologic Studies -      CT ABD PELV WO CONT       Final Result     No acute findings seen.                 CT Results   (Last 48 hours)          None                  CXR Results   (Last 48 hours)          None                    Medical Decision Making     I am the first provider for this patient.      I reviewed the vital signs, available nursing notes, past medical history, past surgical history, family history and social history.      Vital Signs-Reviewed the patient's vital signs.   Patient Vitals for the past 12 hrs:            Temp  Pulse  Resp  BP  SpO2            12/16/19 1351  98.3 ??F (36.8 ??C)  81  16  (!) 136/93  100 %              Records Reviewed: Nursing Notes, Old Medical Records, Previous Radiology Studies and  Previous Laboratory Studies      Provider Notes (Medical Decision Making):    Musculoskeletal pain, kidney stone, arthritis,      ED Course:    Initial assessment performed. The patients presenting problems have been discussed, and they are in agreement with the care plan formulated and outlined with them.  I have encouraged them to ask questions as they arise throughout their visit.            Management plan review:      Patient's management plan was reviewed.  Her overdose risk score is 330.      Progress note:      Patient's results were reviewed.  The patient is advised to follow-up and return to ER if worse                Critical Care Time:    0      Disposition:   home      DISCHARGE PLAN:   1.      Discharge Medication List as of 12/16/2019  4:02 PM              START taking these medications          Details        methocarbamoL (Robaxin-750) 750 mg tablet  Take 1 Tablet by mouth four (4) times daily as needed for Muscle Spasm(s)., Normal, Disp-20 Tablet, R-0                     CONTINUE these medications which have NOT CHANGED          Details        lidocaine 4 % patch  1 Patch by TransDERmal route every twelve (12) hours every twelve (12) hours., Normal, Disp-5 Patch, R-2               atorvastatin (LIPITOR) 10 mg tablet  Take 1 Tab by mouth daily., Normal, Disp-30 Tab, R-3               gabapentin enacarbil (Horizant) 600 mg TbER   Take 600 mg by mouth two (2) times a day. Max Daily Amount: 1,200 mg., Normal, Disp-180 Tab, R-0               rOPINIRole (REQUIP) 1 mg tablet  TAKE 1 TABLET AT 1-2PM AND TAKE 3 TABLETS BY MOUTH 1 TO 3 HOURS BEFORE BEDTIME EVERYDAY, Normal, Disp-360 Tab, R-0               diazePAM (VALIUM) 5 mg tablet  5 mg every eight (8) hours as needed., Historical Med               buPROPion XL (WELLBUTRIN XL) 150 mg tablet  150 mg two (2) times a day. Take 1 tab BID, Historical Med, R-0               aspirin 81 mg chewable tablet  Take 1 Tab by mouth daily., Print, Disp-30 Tab, R-0               ergocalciferol (ERGOCALCIFEROL) 50,000 unit capsule  Take 1 Cap by mouth  every seven (7) days., Normal, Disp-12 Cap, R-1               escitalopram oxalate (LEXAPRO) 20 mg tablet  Take 1 Tab by mouth daily., Normal, Disp-30 Tab, R-5               omeprazole (PRILOSEC) 20 mg capsule  Take 20 mg by mouth nightly., Historical Med                      2.      Follow-up Information               Follow up With  Specialties  Details  Why  Contact Info              Clarene CritchleyGlynn, Francesca L, MD  Internal Medicine    As needed  [address]   [address]   [place] [id number]   [phone]                 [facility] EMERGENCY DEPT  Emergency Medicine    If symptoms worsen  [address]   [place] [id number]   [phone]             3.  Return to ED if worse         Diagnosis        Clinical Impression:       1.  Abdominal pain, right lower quadrant         2.  Bilateral hip pain            Attestations:      Neldon Newportabathia Lucas Exline, MD      Please note that this dictation was completed with Dragon, the computer voice recognition software.  Quite often unanticipated grammatical, syntax, homophones, and other interpretive errors are  inadvertently transcribed by the computer software.  Please disregard these errors.  Please excuse any errors that have escaped final proofreading.  Thank you.

## 2019-12-16 NOTE — ED Notes (Signed)
Assumed care of pt from triage.  Pt is A&O x 4. Pt reports CC of bilateral hip pain that started Saturday. Pt reports she fell off a bar stool on June 3rd and broke L12. Pt had a fusion last week. Pt reports her upper back feels better but the bilateral hip pain has gotten worse. Pt reports she has difficultly having bowel movements but was successful yesterday.     1529- Pt transported to CT

## 2019-12-20 MED ORDER — METHOCARBAMOL 750 MG TAB
750 mg | ORAL_TABLET | Freq: Four times a day (QID) | ORAL | 0 refills | Status: DC | PRN
Start: 2019-12-20 — End: 2020-02-08

## 2019-12-20 MED ORDER — LIDOCAINE 4 % TOPICAL PATCH (12 HOUR DURATION)
4 % | MEDICATED_PATCH | Freq: Two times a day (BID) | CUTANEOUS | 0 refills | Status: DC
Start: 2019-12-20 — End: 2021-05-26

## 2019-12-21 ENCOUNTER — Encounter

## 2019-12-21 NOTE — Telephone Encounter (Signed)
 Requested Prescriptions     Pending Prescriptions Disp Refills   . gabapentin enacarbil  (Horizant ) 600 mg TbER 180 Tablet 0     Sig: Take 600 mg by mouth two (2) times a day. Max Daily Amount: 1,200 mg.     Patient will be out tomorrow.

## 2019-12-21 NOTE — Telephone Encounter (Signed)
Refused refill of Horizant.  I refilled on 11/02/19 for 90 days to [facility]

## 2019-12-26 NOTE — Telephone Encounter (Signed)
Needs a call back from the nurse to recommend another back doctor as the ones that were offered are not accepting new patients currently.

## 2019-12-27 NOTE — Telephone Encounter (Signed)
Provided info to [place] to obtain an appt for consult of her back.

## 2019-12-28 NOTE — Telephone Encounter (Signed)
Duplicate message. See telephone encounter dated 6/23 for details.

## 2019-12-28 NOTE — Telephone Encounter (Signed)
-----   Message from Lyda Perone sent at 12/27/2019 12:50 PM EDT -----  Regarding: Dr.Glynn/telephone  Contact: [phone]  General Message/Vendor Calls    Caller's first and last name:      Reason for call: She called yesterday requesting other recommendations for her fractured vertebrae. The soonest the previous providers could get her in was in August and she does not want to wait that long.       Callback required yes/no and why: Yes      Best contact number(s):[phone]      Details to clarify the request: N/A      Lyda Perone

## 2020-01-01 NOTE — Telephone Encounter (Signed)
Patient is seeing [facility] tomorrow to discuss pain relief options for her compression fracture. She was having trouble getting disc copies of her scans done while she was in the hospital but was able to locate someone in medical records to assist after she left the phone message. She had no further questions or concerns & was thankful for [place] recommending [facility] to her.

## 2020-01-01 NOTE — Telephone Encounter (Signed)
-----   Message from Phillis Haggis sent at 01/01/2020  2:29 PM EDT -----  Regarding: Dr.Glynn/Telephone  General Message/Vendor Calls    Caller's first and last name: pt       Reason for call: requesting a call back from [facility] today       Callback required yes/no and why: yes       Best contact number(s): [phone]       Details to clarify the request: very important that pt is called today as she has an appt tomorrow fo her broken vertebrae        Phillis Haggis

## 2020-01-09 ENCOUNTER — Telehealth: Admit: 2020-01-09 | Payer: MEDICARE | Attending: Neurology | Primary: Internal Medicine

## 2020-01-09 ENCOUNTER — Telehealth: Attending: Neurology | Primary: Internal Medicine

## 2020-01-09 DIAGNOSIS — G2581 Restless legs syndrome: Secondary | ICD-10-CM

## 2020-01-09 MED ORDER — HORIZANT ER 600 MG TABLET,EXTENDED RELEASE
600 mg | ORAL_TABLET | Freq: Two times a day (BID) | ORAL | 1 refills | Status: DC
Start: 2020-01-09 — End: 2020-07-11

## 2020-01-09 MED ORDER — ROPINIROLE 1 MG TAB
1 mg | ORAL_TABLET | ORAL | 1 refills | Status: DC
Start: 2020-01-09 — End: 2020-07-11

## 2020-01-09 NOTE — Progress Notes (Signed)
Neurology Progress Note    HISTORY PROVIDED BY: patient and daughter, Deliah Strehlow is a 64 y.o. female who was seen by synchronous (real-time) audio-video technology on 01/09/2020.  Two factor identification completed.     Consent:  She and/or her healthcare decision maker is aware that this patient-initiated Telehealth encounter is a billable service, with coverage as determined by her insurance carrier. She is aware that she may receive a bill and has provided verbal consent to proceed: Yes    I was in the office while conducting this encounter.    I discussed with the patient the nature of our telemedicine visit: Our sessions are not being recorded and personal health information is protected. We will provide follow up care in person when the patient needs it.     Pursuant to the emergency declaration under the [place], 1135 waiver authority and the [employer] Act, this Virtual  Visit was conducted, with patient's consent, to reduce the patient's risk of exposure to COVID-19 and provide continuity of care for an established patient.    Chief Complaint:   Chief Complaint   Patient presents with   ??? Follow-up     RLS      Subjective:   Pt is a 64y.o. right handed female last seen virtually on 04/16/19 in f/u. Pt has h/o radiographic changes c/w MS, but no clinical symptoms, stable MRI's since 2011, the last MRI brain 02/17/17. Additionally, has RLS, with an ill-defined irritating sensation in her legs with urge to move her legs beginning around 6PM and keeping her awake and inhibiting her activities, though question if there may be some component of psych issues/akasthesia contributing to her need to walk. Fairly well controlled on Horizant 666m bid and Requip 438mdaily. Quick leg jerk at night sounds c/w hypnic jerk. Tremor in pinky sounds normal, will assess at f/u. Pt reports ongoing falls, all of which sound  situational/musculoskeletal, now with right ankle fx.  Has had PT ordered and has been told to use a cane, but has not done these things.  Exam was stable, but limited due to VV, no nystagmus seen, right ptosis, steady gait, o/w unremarkable.   -Continued Horizant 6006mid and Requip 4mg11ms for RLS,     -Continued ASA 81mg89mly and Pravachol.     She returns for virtual f/u. Currently in car, being driven to another doctors appt for spine issue.  She fell on June 3rd, fx a thoracic 12th vertebra, was climbing up on a stool in socks and slipped as she was trying to step down. She reports that her RLS sxs are stable. No other new medical issues or complaints.     Previous testing:  In past, c/o memory loss, suspected due to underlying mood disorder/personality d/o, and stress, and this suspicion was confirmed on neuropsychological testing with Dr. O'GraTennis Shippril, 2019.    Past Medical History:   Diagnosis Date   ??? Abnormal brain scan    ??? Anxiety disorder    ??? Arthritis    ??? Chronic pain    ??? Depression    ??? DVT (deep venous thrombosis) (HCC) [place]??? Falls frequently    ??? Fracture of left ankle    ??? Headache(784.0) 2011   ??? Ill-defined condition 09/15/2015    FX R HAND   ??? 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 Other and unspecified hyperlipidemia  12/27/2012   ??? PE (pulmonary embolism) 12/2012   ??? RLS (restless legs syndrome) 03/16/2016    Dr Roseanne Reno   ??? TIA (transient ischemic attack) 03/2016    Right facial droop, numbness in lip   ??? Torn rotator cuff     right      Past Surgical History:   Procedure Laterality Date   ??? HX APPENDECTOMY     ??? HX CESAREAN SECTION      x4   ??? HX GI      COLONOSCOPY   ??? 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 HX ORTHOPAEDIC  08/2004    rotater cuff r. shoulder   ??? HX ORTHOPAEDIC      left ankle fracture treated with air cast   ??? HX ORTHOPAEDIC  6/13    left shoulder fxt and dislocation   ??? HX OTHER SURGICAL  2010    left foot torn tendon   ??? HX TUBAL LIGATION     ??? IMPLANT BREAST  SILICONE/EQ  3474    Bilateral breast implants   ??? IR KYPHOPLASTY THORACIC  12/12/2019   ??? PR BREAST SURGERY PROCEDURE UNLISTED  2002    AUGMENTATION      Social History     Socioeconomic History   ??? Marital status: MARRIED     Spouse name: Not on file   ??? Number of children: Not on file   ??? Years of education: Not on file   ??? Highest education level: Not on file   Occupational History   ??? Not on file   Tobacco Use   ??? Smoking status: Never Smoker   ??? Smokeless tobacco: Never Used   Substance and Sexual Activity   ??? Alcohol use: No     Comment: Pt denies   ??? Drug use: No   ??? Sexual activity: Yes     Partners: Male   Other Topics Concern   ??? Not on file   Social History Narrative   ??? Not on file     Social Determinants of Health     Financial Resource Strain:    ??? Difficulty of Paying Living Expenses:    Food Insecurity:    ??? Worried About Charity fundraiser in the Last Year:    ??? Arboriculturist in the Last Year:    Transportation Needs:    ??? Film/video editor (Medical):    ??? Lack of Transportation (Non-Medical):    Physical Activity:    ??? Days of Exercise per Week:    ??? Minutes of Exercise per Session:    Stress:    ??? Feeling of Stress :    Social Connections:    ??? Frequency of Communication with Friends and Family:    ??? Frequency of Social Gatherings with Friends and Family:    ??? Attends Religious Services:    ??? Marine scientist or Organizations:    ??? Attends Music therapist:    ??? Marital Status:    Intimate Production manager Violence:    ??? Fear of Current or Ex-Partner:    ??? Emotionally Abused:    ??? Physically Abused:    ??? Sexually Abused:      Family History   Problem Relation Age of Onset   ??? Heart Disease Mother    ??? Heart Disease Father    ??? Cancer Father         thyroid cancer   ??? Arthritis-rheumatoid  Sister    ??? No Known Problems Son    ??? No Known Problems Daughter    ??? No Known Problems Daughter    ??? No Known Problems Daughter    ??? Anesth Problems Neg Hx           Objective:   ROS:  Per HPI  or PMH o/w reviewed and neg    No Known Allergies    Meds:  Outpatient Medications Prior to Visit   Medication Sig Dispense Refill   ??? lidocaine (Lidocaine Pain Relief) 4 % patch 1 Patch by TransDERmal route every twelve (12) hours every twelve (12) hours. 10 Patch 0   ??? methocarbamoL (Robaxin-750) 750 mg tablet Take 1 Tablet by mouth four (4) times daily as needed for Muscle Spasm(s). 20 Tablet 0   ??? atorvastatin (LIPITOR) 10 mg tablet Take 1 Tab by mouth daily. 30 Tab 3   ??? gabapentin enacarbil (Horizant) 600 mg TbER Take 600 mg by mouth two (2) times a day. Max Daily Amount: 1,200 mg. 180 Tab 0   ??? rOPINIRole (REQUIP) 1 mg tablet TAKE 1 TABLET AT 1-2PM AND TAKE 3 TABLETS BY MOUTH 1 TO 3 HOURS BEFORE BEDTIME EVERYDAY 360 Tab 0   ??? diazePAM (VALIUM) 5 mg tablet 5 mg every eight (8) hours as needed.     ??? buPROPion XL (WELLBUTRIN XL) 150 mg tablet 150 mg two (2) times a day. Take 1 tab BID  0   ??? aspirin 81 mg chewable tablet Take 1 Tab by mouth daily. 30 Tab 0   ??? ergocalciferol (ERGOCALCIFEROL) 50,000 unit capsule Take 1 Cap by mouth every seven (7) days. 12 Cap 1   ??? escitalopram oxalate (LEXAPRO) 20 mg tablet Take 1 Tab by mouth daily. 30 Tab 5   ??? omeprazole (PRILOSEC) 20 mg capsule Take 20 mg by mouth nightly.     ??? methocarbamoL (Robaxin-750) 750 mg tablet Take 1 Tablet by mouth four (4) times daily as needed for Muscle Spasm(s). (Patient not taking: Reported on 01/09/2020) 20 Tablet 0     No facility-administered medications prior to visit.       Imaging:  MRI Results (most recent):  Results from Hospital Encounter encounter on 12/11/19    MRI [facility] SPINE WO CONT    Narrative  EXAM: MRI [facility] SPINE WO CONT, MRI LUMB SPINE WO CONT    INDICATION: t12 compression fx on 6/3, r/o cord compression.    COMPARISON: Lumbar spine CT dated 12/06/2019    TECHNIQUE: MR imaging of the thoracic and lumbar spine was performed using the  following sequences: sagittal T1, T2, stir; axial T1, T2. Additional coronal T2  images  were obtained through the lumbar spine    CONTRAST: None.    FINDINGS:    Comparison prior study, there is increased loss of height of the T12 vertebral  body with increased bony retropulsion. Superior endplate compression fracture of  L4 is also noted which is likely chronic. There is moderate severe canal  stenosis at T12 with moderate bilateral foraminal narrowing. Possible mild  compression on the spinal cord. No definite abnormal signal is identified in the  conus.    The remaining thoracic spine levels are unremarkable.    T12-L1 demonstrates minimal disc bulge without canal stenosis or foraminal  narrowing.    L1-2 demonstrates no evidence of canal stenosis or foraminal narrowing.    At L2-3, there is mild facet hypertrophy without canal stenosis or foraminal  narrowing.  At L3-4, there is a mild concentric disc bulge and facet hypertrophy with  minimal canal stenosis and minimal bilateral foraminal narrowing.    L4-5 there is severe facet hypertrophy and ligament flavum hypertrophy as well  with a mild disc bulge causing mild canal stenosis. No significant foraminal  narrowing.    L5-S1, there is concentric bulge with osteophyte formation and a probable small  central disc extrusion with cranial migration. Overall canal stenosis is  minimal. The neural foramen are patent.    Incidental soft tissue imaging is unremarkable    Impression  1.  Increase loss of height at T12 with increased bony retropulsion and moderate  severe canal stenosis. Possible compression on the cord although no abnormal  cord signal is identified.    2.  Other degenerative changes in the lumbar spine as described.    3.  Mild superior endplate compression deformity of L4 with a chronic     CT Results (most recent):  Results from Hospital Encounter encounter on 12/16/19    CT ABD PELV WO CONT    Narrative  EXAM: CT ABD PELV WO CONT    INDICATION: Right groin, right lower quadrant and bilateral pelvic pain    COMPARISON:  None    CONTRAST:  None.    TECHNIQUE:  Thin axial images were obtained through the abdomen and pelvis. Coronal and  sagittal reformats were generated. Oral contrast was not administered. CT dose  reduction was achieved through use of a standardized protocol tailored for this  examination and automatic exposure control for dose modulation.    The absence of intravenous contrast material reduces the sensitivity for  evaluation of the vasculature and solid organs.    FINDINGS:  LOWER THORAX: No significant abnormality in the incidentally imaged lower chest.  Bilateral breast implants  LIVER: No mass.  BILIARY TREE: Gallbladder is within normal limits. CBD is not dilated.  SPLEEN: within normal limits.  PANCREAS: No focal abnormality.  ADRENALS: Unremarkable.  KIDNEYS/URETERS: No calculus or hydronephrosis.  STOMACH: Unremarkable.  SMALL BOWEL: No dilatation or wall thickening.  COLON: No dilatation or wall thickening.  APPENDIX: Appendectomy  PERITONEUM: No ascites or pneumoperitoneum.  RETROPERITONEUM: No lymphadenopathy or aortic aneurysm.  REPRODUCTIVE ORGANS: Tubal ligation  URINARY BLADDER: No mass or calculus.  BONES: No destructive bone lesion.  ABDOMINAL WALL: No mass or hernia.  ADDITIONAL COMMENTS: N/A    Impression  No acute findings seen.       Reviewed records in [facility] and media tab today    Lab Review   Results for orders placed or performed during the hospital encounter of 12/11/19   CBC WITH AUTOMATED DIFF   Result Value Ref Range    WBC 11.5 (H) 3.6 - 11.0 K/uL    RBC 3.92 3.80 - 5.20 M/uL    HGB 10.1 (L) 11.5 - 16.0 g/dL    HCT 32.5 (L) 35.0 - 47.0 %    MCV 82.9 80.0 - 99.0 FL    MCH 25.8 (L) 26.0 - 34.0 PG    MCHC 31.1 30.0 - 36.5 g/dL    RDW 14.3 11.5 - 14.5 %    PLATELET 236 150 - 400 K/uL    MPV 9.3 8.9 - 12.9 FL    NRBC 0.0 0 PER 100 WBC    ABSOLUTE NRBC 0.00 0.00 - 0.01 K/uL    NEUTROPHILS 57 32 - 75 %    LYMPHOCYTES 29 12 - 49 %    MONOCYTES 9 5 -  13 %    EOSINOPHILS 4 0 - 7 %     BASOPHILS 0 0 - 1 %    IMMATURE GRANULOCYTES 1 (H) 0.0 - 0.5 %    ABS. NEUTROPHILS 6.5 1.8 - 8.0 K/UL    ABS. LYMPHOCYTES 3.3 0.8 - 3.5 K/UL    ABS. MONOCYTES 1.1 (H) 0.0 - 1.0 K/UL    ABS. EOSINOPHILS 0.5 (H) 0.0 - 0.4 K/UL    ABS. BASOPHILS 0.1 0.0 - 0.1 K/UL    ABS. IMM. GRANS. 0.1 (H) 0.00 - 0.04 K/UL    DF AUTOMATED     METABOLIC PANEL, COMPREHENSIVE   Result Value Ref Range    Sodium 136 136 - 145 mmol/L    Potassium 4.1 3.5 - 5.1 mmol/L    Chloride 104 97 - 108 mmol/L    CO2 29 21 - 32 mmol/L    Anion gap 3 (L) 5 - 15 mmol/L    Glucose 85 65 - 100 mg/dL    BUN 23 (H) 6 - 20 MG/DL    Creatinine 0.74 0.55 - 1.02 MG/DL    BUN/Creatinine ratio 31 (H) 12 - 20      GFR est AA >60 >60 ml/min/1.37m    GFR est non-AA >60 >60 ml/min/1.777m   Calcium 9.4 8.5 - 10.1 MG/DL    Bilirubin, total 0.4 0.2 - 1.0 MG/DL    ALT (SGPT) 25 12 - 78 U/L    AST (SGOT) 16 15 - 37 U/L    Alk. phosphatase 98 45 - 117 U/L    Protein, total 7.0 6.4 - 8.2 g/dL    Albumin 3.6 3.5 - 5.0 g/dL    Globulin 3.4 2.0 - 4.0 g/dL    A-G Ratio 1.1 1.1 - 2.2     URINALYSIS W/ REFLEX CULTURE    Specimen: Urine   Result Value Ref Range    Color YELLOW/STRAW      Appearance CLEAR CLEAR      Specific gravity 1.005 1.003 - 1.030      pH (UA) 6.5 5.0 - 8.0      Protein Negative NEG mg/dL    Glucose Negative NEG mg/dL    Ketone Negative NEG mg/dL    Bilirubin Negative NEG      Blood Negative NEG      Urobilinogen 0.2 0.2 - 1.0 EU/dL    Nitrites Negative NEG      Leukocyte Esterase Negative NEG      WBC 0-4 0 - 4 /hpf    RBC 0-5 0 - 5 /hpf    Epithelial cells FEW FEW /lpf    Bacteria Negative NEG /hpf    UA:UC IF INDICATED CULTURE NOT INDICATED BY UA RESULT CNI      Hyaline cast 0-2 0 - 5 /lpf   METABOLIC PANEL, COMPREHENSIVE   Result Value Ref Range    Sodium 137 136 - 145 mmol/L    Potassium 3.9 3.5 - 5.1 mmol/L    Chloride 103 97 - 108 mmol/L    CO2 32 21 - 32 mmol/L    Anion gap 2 (L) 5 - 15 mmol/L    Glucose 86 65 - 100 mg/dL    BUN 20 6 - 20 MG/DL     Creatinine 0.73 0.55 - 1.02 MG/DL    BUN/Creatinine ratio 27 (H) 12 - 20      GFR est AA >60 >60 ml/min/1.7327m  GFR est non-AA >60 >60 ml/min/1.77m45m  Calcium 8.0 (L) 8.5 - 10.1 MG/DL    Bilirubin, total 0.3 0.2 - 1.0 MG/DL    ALT (SGPT) 21 12 - 78 U/L    AST (SGOT) 12 (L) 15 - 37 U/L    Alk. phosphatase 99 45 - 117 U/L    Protein, total 6.4 6.4 - 8.2 g/dL    Albumin 3.2 (L) 3.5 - 5.0 g/dL    Globulin 3.2 2.0 - 4.0 g/dL    A-G Ratio 1.0 (L) 1.1 - 2.2     CBC WITH AUTOMATED DIFF   Result Value Ref Range    WBC 7.8 3.6 - 11.0 K/uL    RBC 4.02 3.80 - 5.20 M/uL    HGB 10.6 (L) 11.5 - 16.0 g/dL    HCT 34.8 (L) 35.0 - 47.0 %    MCV 86.6 80.0 - 99.0 FL    MCH 26.4 26.0 - 34.0 PG    MCHC 30.5 30.0 - 36.5 g/dL    RDW 14.5 11.5 - 14.5 %    PLATELET 211 150 - 400 K/uL    MPV 9.0 8.9 - 12.9 FL    NRBC 0.0 0 PER 100 WBC    ABSOLUTE NRBC 0.00 0.00 - 0.01 K/uL    NEUTROPHILS 51 32 - 75 %    LYMPHOCYTES 33 12 - 49 %    MONOCYTES 9 5 - 13 %    EOSINOPHILS 6 0 - 7 %    BASOPHILS 1 0 - 1 %    IMMATURE GRANULOCYTES 0 0.0 - 0.5 %    ABS. NEUTROPHILS 4.0 1.8 - 8.0 K/UL    ABS. LYMPHOCYTES 2.5 0.8 - 3.5 K/UL    ABS. MONOCYTES 0.7 0.0 - 1.0 K/UL    ABS. EOSINOPHILS 0.5 (H) 0.0 - 0.4 K/UL    ABS. BASOPHILS 0.0 0.0 - 0.1 K/UL    ABS. IMM. GRANS. 0.0 0.00 - 0.04 K/UL    DF AUTOMATED     HGB & HCT   Result Value Ref Range    HGB 10.0 (L) 11.5 - 16.0 g/dL    HCT 33.9 (L) 35.0 - 47.0 %   HGB & HCT   Result Value Ref Range    HGB 9.7 (L) 11.5 - 16.0 g/dL    HCT 31.8 (L) 35.0 - 47.0 %   HGB & HCT   Result Value Ref Range    HGB 9.6 (L) 11.5 - 16.0 g/dL    HCT 31.7 (L) 35.0 - 47.0 %   HGB & HCT   Result Value Ref Range    HGB 9.0 (L) 11.5 - 16.0 g/dL    HCT 30.2 (L) 35.0 - 47.0 %      Limited due to virtual visit.  Exam:  There were no vitals taken for this visit.  General:  Alert, cooperative, no distress.    Head:  Normocephalic, without obvious abnormality, atraumatic.   Respiratory:  Heart:   Non labored breathing     Carotids:     Extremities:    Pulses:        Neurologic:  MS: Alert and oriented x 4, speech intact. Language - inact. Attention and fund of knowledge appropriate.  Recent and remote memory intact.  Cranial Nerves:  II: visual fields    II: pupils    II: optic disc    III,VII: ptosis Mild right ptosis - stable   III,IV,VI: extraocular muscles  EOMI, no nystagmus seen, no diplopia   V: facial light touch  sensation     VII: facial muscle function   symmetric   VIII: hearing intact   IX: soft palate elevation     XI: trapezius strength     XI: sternocleidomastoid strength    XII: tongue     At previous OV:  Motor: normal bulk and tone, no tremor, no PD  Sensory: (At previous visits: Intact to LT/PP throughout)  Coordination: FTN intact  Gait: normal gait   Reflexes: Unable to assess       Assessment/Plan   Pt is a 64y.o. right handed female h/o radiographic changes c/w MS, but no clinical symptoms, stable MRI's since 2011, the last MRI brain 02/17/17. Additionally, has RLS, with an ill-defined irritating sensation in her legs with urge to move her legs beginning around 6PM and keeping her awake and inhibiting her activities, though question if there may be some component of psych issues/akasthesia contributing to her need to walk.Well controlled on Horizant 61m bid and Requip 436mdaily. Ongoing falls due to poor decision making.  Exam is severely limited due to VV with pt riding in a car.    -Continue Horizant 600107mid and Requip 4mg75ms for RLS,     -Continue ASA 81mg26mly and Pravachol.   -F/u in clinic, not virtually, in 6 months, instructed to call in the interim if needed.     ICD-10-CM ICD-9-CM    1. RLS (restless legs syndrome)  G25.81 333.94 gabapentin enacarbil (Horizant) 600 mg TbER       Signed:  Arlyne Brandes Chauncey [place] 01/09/2020

## 2020-01-17 ENCOUNTER — Inpatient Hospital Stay
Admit: 2020-01-17 | Discharge: 2020-01-17 | Disposition: A | Discharge status: Home / Self Care | Payer: MEDICARE | Attending: Emergency Medicine

## 2020-01-17 DIAGNOSIS — M545 Low back pain: Secondary | ICD-10-CM

## 2020-01-17 NOTE — Telephone Encounter (Signed)
I called her-she has been seeing dr Doylene Bode for management of the back pain, sp kyphoplasty for t12 fracture on 6/9  Feels pain in lower lumbar area now- mri showed disc bulge at l5-s1 suspect this is cause of current pain  Advised see me next week to discuss pain meds  Advised see neurosurgery to discuss other management options for bulging disc

## 2020-01-17 NOTE — ED Provider Notes (Signed)
ED Provider Notes by Sondra Barges, PA at 01/17/20 1204                Author: Sondra Barges, PA  Service: Emergency Medicine  Author Type: Physician Assistant       Filed: 01/18/20 0946  Date of Service: 01/17/20 1204  Status: Attested           Editor: Sondra Barges, PA (Physician Assistant)  Cosigner: Min-Venditti, Darliss Cheney, MD at 01/21/20 [phone]          Attestation signed by Min-Venditti, Darliss Cheney, MD at 01/21/20 0601          6:01 AM   I was personally available for consultation in the emergency department.  I have reviewed the chart and agree with the documentation recorded by the APP, including the assessment, treatment plan, and disposition.   Dot Lanes, MD                                    EMERGENCY DEPARTMENT HISTORY AND PHYSICAL EXAM           Date: 01/17/2020   Patient Name: Lindsay [name]        History of Presenting Illness          Chief Complaint       Patient presents with        ?  Back Pain             Ambulatory into the ED d/t persistent, unresolved Rt lower back pain since mechanical fall on 6/3, when she broke a vertebra. Reports having surgery  and has seen Ortho at [facility] (she brought images with her). Denies new symptoms and/or injury, just reports unbearable back pain.           History Provided By: Patient, daughter at bedside      HPI: Lindsay [name],  64 y.o. female with PMHx of chronic pain, T12 compression fx s/p kyphoplasty, anxiety, blood  clots, presents BIB self to the ED with cc of pain at the R lumbar region and R hip, described as "pulling and stretching," worse with getting out of bed. Pain has been present since her kyphoplasty performed 1 month ago for T12 compression fx.  Patient  states she woke up from the surgery and noticed the pain and was told it was related to the kyphoplasty.   Patient states she does not know the name of the surgeon that operated on her, so she does not know who to follow-up with. [facility]   but they reportedly wouldn't see her for f/u since they did not perform the surgery. "I've been living on motrin."  Patient has also tried muscle relaxants, Tylenol, Lidoderm patches.  She takes daily Valium and gabapentin for her chronic pain. Denies  LE numbness, tingling, weakness, loss of bladder/bowel function, saddle anesthesia. Pt had an MRI of the pelvis and hip, as well as lumbar xrays, performed last week at [facility]. She has the CD and report in hand.       There are no other complaints, changes, or physical findings at this time.      PCP: Clarene Critchley, MD        No current facility-administered medications on file prior to encounter.          Current Outpatient Medications on File Prior to Encounter  Medication  Sig  Dispense  Refill           ?  gabapentin enacarbil (Horizant) 600 mg TbER  Take 600 mg by mouth two (2) times a day. Max Daily Amount: 1,200 mg.  180 Tablet  1     ?  rOPINIRole (REQUIP) 1 mg tablet  TAKE 1 TABLET AT 1-2PM AND TAKE 3 TABLETS BY MOUTH 1 TO 3 HOURS BEFORE BEDTIME EVERYDAY  360 Tablet  1     ?  methocarbamoL (Robaxin-750) 750 mg tablet  Take 1 Tablet by mouth four (4) times daily as needed for Muscle Spasm(s). (Patient not taking: Reported on 01/09/2020)  20 Tablet  0     ?  lidocaine (Lidocaine Pain Relief) 4 % patch  1 Patch by TransDERmal route every twelve (12) hours every twelve (12) hours.  10 Patch  0     ?  methocarbamoL (Robaxin-750) 750 mg tablet  Take 1 Tablet by mouth four (4) times daily as needed for Muscle Spasm(s).  20 Tablet  0     ?  atorvastatin (LIPITOR) 10 mg tablet  Take 1 Tab by mouth daily.  30 Tab  3     ?  diazePAM (VALIUM) 5 mg tablet  5 mg every eight (8) hours as needed.         ?  buPROPion XL (WELLBUTRIN XL) 150 mg tablet  150 mg two (2) times a day. Take 1 tab BID    0     ?  aspirin 81 mg chewable tablet  Take 1 Tab by mouth daily.  30 Tab  0     ?  ergocalciferol (ERGOCALCIFEROL) 50,000 unit capsule  Take 1 Cap by mouth  every seven (7) days.  12 Cap  1     ?  escitalopram oxalate (LEXAPRO) 20 mg tablet  Take 1 Tab by mouth daily.  30 Tab  5           ?  omeprazole (PRILOSEC) 20 mg capsule  Take 20 mg by mouth nightly.                 Past History        Past Medical History:     Past Medical History:        Diagnosis  Date         ?  Abnormal brain scan       ?  Anxiety disorder       ?  Arthritis       ?  Chronic pain       ?  Depression       ?  DVT (deep venous thrombosis) (HCC)       ?  Falls frequently       ?  Fracture of left ankle       ?  MWUXLKGM(010.2)  2011     ?  Ill-defined condition  09/15/2015          FX R HAND         ?  Neurocutaneous syndrome (HCC)       ?  Other and unspecified hyperlipidemia  12/27/2012     ?  PE (pulmonary embolism)  12/2012     ?  RLS (restless legs syndrome)  03/16/2016          Dr Scarlett Presto         ?  TIA (transient ischemic attack)  03/2016  Right facial droop, numbness in lip         ?  Torn rotator cuff            right           Past Surgical History:     Past Surgical History:         Procedure  Laterality  Date          ?  HX APPENDECTOMY         ?  HX CESAREAN SECTION              x4          ?  HX GI              COLONOSCOPY          ?  HX KNEE REPLACEMENT  Left  10/29/2015     ?  HX ORTHOPAEDIC    08/2004          rotater cuff r. shoulder          ?  HX ORTHOPAEDIC              left ankle fracture treated with air cast          ?  HX ORTHOPAEDIC    6/13          left shoulder fxt and dislocation          ?  HX OTHER SURGICAL    2010          left foot torn tendon          ?  HX TUBAL LIGATION         ?  IMPLANT BREAST SILICONE/EQ    2002          Bilateral breast implants          ?  IR KYPHOPLASTY THORACIC    12/12/2019     ?  PR BREAST SURGERY PROCEDURE UNLISTED    2002          AUGMENTATION           Family History:     Family History         Problem  Relation  Age of Onset          ?  Heart Disease  Mother       ?  Heart Disease  Father       ?  Cancer  Father                 thyroid cancer          ?  Arthritis-rheumatoid  Sister       ?  No Known Problems  Son       ?  No Known Problems  Daughter       ?  No Known Problems  Daughter       ?  No Known Problems  Daughter            ?  Anesth Problems  Neg Hx             Social History:     Social History          Tobacco Use         ?  Smoking status:  Never Smoker     ?  Smokeless tobacco:  Never Used       Substance Use Topics         ?  Alcohol use:  No             Comment: Pt denies         ?  Drug use:  No           Allergies:   No Known Allergies           Review of Systems     Review of Systems    Constitutional: Negative for fever.    HENT: Negative for voice change.     Eyes: Negative for redness.    Respiratory: Negative for shortness of breath.     Cardiovascular: Negative for chest pain.    Gastrointestinal: Negative for abdominal pain, nausea and vomiting.    Endocrine: Negative for polydipsia.    Genitourinary: Negative for difficulty urinating and dysuria.    Musculoskeletal: Positive for arthralgias and back pain .    Skin: Negative for rash.    Allergic/Immunologic:         Nkda    Neurological: Negative for dizziness.    Hematological: Does not bruise/bleed easily.    Psychiatric/Behavioral: Negative for hallucinations.            Physical Exam     Physical Exam   Vitals and nursing note reviewed.   Constitutional:        Appearance: Normal appearance. She is well-developed. She is not toxic-appearing.      Comments: Anxious but in NAD   HENT:       Head: Normocephalic and atraumatic.      Nose: Nose normal.      Mouth/Throat:      Mouth: Mucous membranes are moist.    Eyes:       General: Lids are normal.      Extraocular Movements: Extraocular movements intact.      Conjunctiva/sclera: Conjunctivae normal.      Pupils: Pupils are equal, round, and reactive to light.    Cardiovascular:       Rate and Rhythm: Normal rate and regular rhythm.   Pulmonary :       Effort: Pulmonary effort is normal.      Breath sounds:  Normal breath sounds.   Abdominal :      Palpations: Abdomen is soft.      Tenderness: There is no abdominal tenderness. There is no right CVA tenderness, left CVA tenderness or guarding.     Musculoskeletal:          General: Normal range of motion.      Cervical back: Normal range of motion and neck supple.      Comments: No midline or paraspinal tenderness.  Subjective pain reported in right lumbar  region and right hip.  5/5 strength and sensation b/l UE/LEs.   Gait is steady and symmetrical.    Skin:      General: Skin is warm and dry.   Neurological :       General: No focal deficit present.      Mental Status: She is alert and oriented to person, place, and time.      Cranial Nerves: No cranial nerve deficit.      Sensory: No sensory deficit.      Motor: No weakness.      Coordination:  Coordination normal.      Gait: Gait normal.   Psychiatric :         Mood and Affect: Mood normal.         Behavior: Behavior normal. Behavior  is cooperative.               Diagnostic Study Results        Labs -    No results found for this or any previous visit (from the past 12 hour(s)).      Radiologic Studies -      No orders to display          CT Results   (Last 48 hours)          None                 CXR Results   (Last 48 hours)          None                       Medical Decision Making     I am the first provider for this patient.      I reviewed the vital signs, available nursing notes, past medical history, past surgical history, family history and social history.      Vital Signs-Reviewed the patient's vital signs.   Patient Vitals for the past 12 hrs:            Temp  Pulse  Resp  BP  SpO2            01/17/20 1152  98.6 ??F (37 ??C)  95  12  118/70  99 %           Records Reviewed: Nursing Notes, Old Medical Records and Previous Radiology Studies      Provider Notes (Medical Decision Making):       [phone] with chronic pain presents with R lumbar and R hip pain, which has been present since waking up from T12  kyphoplasty procedure performed here by IR 1 month ago. Pt exhibits no neuro deficits. She recently had an MRI performed at an OSH-- report copied  and entered into pt's chart. On chart review, I was unable to find the operative report from patient's kyphoplasty.  I called over to IR and was informed Dr.Mahajan performed the procedure.  He is a Patent attorney and does not see patients on an outpatient  basis. Discussed this with pt. reviewed treatment options.  I do not feel narcotics would be prudent given her long-term use of benzos and gabapentin.  Steroids also pose a risk due to her baseline anxiety.  Patient declines prescription for muscle relaxants,  Lidoderm patches, Tylenol, Motrin.  She desires discharge from the ED.  Advised on follow-up with PCP, pain management or Ortho/spine.      Reviewed care management plans and PMP report. Overdose score is 330.        ED Course:    Initial assessment performed. The patients presenting problems have been discussed, and they are in agreement with the care plan formulated and outlined with them.  I have encouraged them to ask questions as they arise throughout their visit.       Case discussed with attending, Dr. Elouise Munroe.      Critical Care Time: None      Disposition:   D/c      PLAN:   1.      Discharge Medication List as of 01/17/2020 12:42 PM               2.      Follow-up Information               Follow  up With  Specialties  Details  Why  Contact Info              [facility] EM[facility] DE[facility]  Emergency Medicine    As needed, If symptoms worsen  [address]   [place] [id number]   [phone]              Clarene CritchleyGlynn, Francesca L, MD  Internal Medicine  Call   For follow up  [address]   [address]   [place] [id number]   [phone]                 [facility]    Call   For follow up  [address]   [address]   [place] [id number]   [phone]             Return to ED if worse         Diagnosis        Clinical  Impression:       1.  Chronic right-sided low back pain without sciatica         2.  S/P kyphoplasty                 Please note that this dictation was completed with Dragon, the computer voice recognition software. Quite often unanticipated grammatical, syntax, homophones, and other interpretive errors are inadvertently  transcribed by the computer software. Please disregards these errors. Please excuse any errors that have escaped final proofreading.

## 2020-01-17 NOTE — Telephone Encounter (Signed)
Patient called to state she broke her 12th vertebrae in early June, she went back to the hospital on June 8th because they suspected her vertebrae was rubbing against her spine. They injected a gel the next day, she stated she still has a lot of pain, she went back on the 13th and was told to take motrin. She has been using motrin and muscle relaxers since June 13th and nothing has been helping. The patient would like a call back from the nurse to discuss where she should go for help as she does not feel like the hospital she is at is helping her properly or taking her serious. She is at [facility]. She would like to know what hospital the nurse and Dr. Darl Pikes thinks she should go to. She is very upset and was close to tears at the end of the conversation with PSR. Please call back and advise.

## 2020-01-17 NOTE — Telephone Encounter (Signed)
Duplicate encounter. See other telephone encounter dated today for details. PCP reached out to patient.

## 2020-01-17 NOTE — ED Notes (Signed)
I have reviewed discharge instructions with the patient.  The patient verbalized understanding.  Alert and stable to walk at discharge with her daughter

## 2020-01-23 NOTE — Telephone Encounter (Signed)
-----   Message from Arville Lime sent at 01/23/2020 10:41 AM EDT -----  Regarding: Glynn,MD/Telephone  General Message/Vendor Calls    Caller's first and last name: Self      Reason for call: Excruciating back pain.      Callback required yes/no and why: Yes      Best contact number(s): [phone]      Details to clarify the request: Pt states she has been speaking with Dr Darl Pikes regarding her fall leading to a broken vertebrae and severe back pain. Pt states she is still having trouble walking and is also unable to sit for long periods of time. Said she was told by pcp to see her this week if it persists. Offered appt this week but she prefers to speak with pcp or nurse first.      Arville Lime

## 2020-01-23 NOTE — Telephone Encounter (Signed)
Returned call to patient. She states per neurosurgery they will not see her due to her recent kyphoplasty. [facility] suggested cortisone shots on both sides of her back. She feels she is running out of options to get relief. She has contacted the interventional radiologist that did the kyphoplasty & is waiting for a return call to get opinion. She has an appt with PCP tomorrow to discuss pain meds for treatment but she does not want to be on pain meds. Encouraged patient to keep the appt with PCP to discuss everything & try to come up with a plan. Patient voiced understanding.

## 2020-01-23 NOTE — Telephone Encounter (Signed)
Returned call to patient. She states she spoke to Dr.Glynn last week who advised her to make an appt to discuss pain meds for her ongoing back pain. Patient scheduled for tomorrow at 2:45 with PCP. Also advised needs to see neurosurgery to discuss her disc bulge issue & treatment options as well. [facility] provided. She will call to schedule an appt with them as well.

## 2020-01-23 NOTE — Telephone Encounter (Signed)
-----   Message from [facility] sent at 01/23/2020 12:56 PM EDT -----  Regarding: Dr. Sabra Heck  Patient return call    Caller's first and last name and relationship (if not the patient): n/a      Best contact number(s): [phone]      Whose call is being returned: [place]      Details to clarify the request: Neuro surgeon will not see her      Ernst Breach

## 2020-01-24 ENCOUNTER — Encounter: Attending: Internal Medicine | Primary: Internal Medicine

## 2020-02-07 ENCOUNTER — Ambulatory Visit: Payer: MEDICARE | Attending: Internal Medicine | Primary: Internal Medicine

## 2020-02-07 NOTE — Telephone Encounter (Signed)
-----   Message from Verlin Grills sent at 02/07/2020 11:23 AM EDT -----  Regarding: Dr. Sabra Heck  General Message/Vendor Calls    Caller's first and last name: Pt       Reason for call: Wants to speak with [place], nurse for Dr. Darl Pikes.       Callback required yes/no and why: yes, to discuss       Best contact number(s): [phone]      Details to clarify the request: N/A      Verlin Grills

## 2020-02-07 NOTE — Telephone Encounter (Signed)
Patient has a scheduled appt today.

## 2020-02-08 ENCOUNTER — Ambulatory Visit: Admit: 2020-02-08 | Payer: MEDICARE | Attending: Internal Medicine | Primary: Internal Medicine

## 2020-02-08 ENCOUNTER — Ambulatory Visit: Attending: Internal Medicine | Primary: Internal Medicine

## 2020-02-08 DIAGNOSIS — M533 Sacrococcygeal disorders, not elsewhere classified: Secondary | ICD-10-CM

## 2020-02-08 NOTE — Progress Notes (Signed)
HISTORY OF PRESENT ILLNESS  Lindsay [name] is a 64 y.o. female.  HPI  Lindsay [name] is seen to discuss pain management.  She had a fall in June, suffered a T12 vertebral fracture.  Had a kyphoplasty on June 9.  Continued to have significant back pain.  Subsequently had a CT of abdomen and pelvis on June 13, which was normal.  She has been working with Dr. Doylene Bode, who has started injections and has a right low back injection planned for next week.  She currently denies pain in thoracic area, but has a burning pain in right SI region, which radiates around her side and towards her lower abdomen.  She spends most of her time on the sofa.  It hurts her to ride in a car.  She is not having nausea, vomiting, fevers, chills or changes in bowel.  She is taking methocarbamol four times a day with ibuprofen 200 four times a day.  Could not tolerate the prescription anti-inflammatory and is currently off of all narcotics.      Review of Systems   Constitutional: Positive for malaise/fatigue. Negative for chills, fever and weight loss.   Respiratory: Negative for cough, shortness of breath and wheezing.    Cardiovascular: Negative for chest pain, palpitations, orthopnea, leg swelling and PND.   Gastrointestinal: Negative for abdominal pain, blood in stool, diarrhea, heartburn, melena and nausea.   Genitourinary: Negative for dysuria.   Musculoskeletal: Positive for back pain. Negative for myalgias.   Neurological: Negative for dizziness, focal weakness and headaches.       Physical Exam  Vitals and nursing note reviewed.   Constitutional:       Appearance: She is well-developed.   HENT:      Head: Normocephalic and atraumatic.   Neck:      Thyroid: No thyromegaly.      Vascular: No carotid bruit.   Cardiovascular:      Rate and Rhythm: Normal rate and regular rhythm.      Heart sounds: Normal heart sounds, S1 normal and S2 normal. No murmur heard.     Pulmonary:      Effort: Pulmonary effort is normal. No respiratory distress.       Breath sounds: Normal breath sounds. No wheezing or rales.   Abdominal:      General: There is no distension.      Palpations: Abdomen is soft.      Tenderness: There is no abdominal tenderness. There is no right CVA tenderness or left CVA tenderness.   Musculoskeletal:      Cervical back: Normal range of motion and neck supple.      Comments: Tender right si  Joint and right lower ribs  Able to lift both legs off table and slr is neg  Good rom right hip   Neurological:      Mental Status: She is alert and oriented to person, place, and time.   Psychiatric:         Behavior: Behavior normal.         ASSESSMENT and PLAN  Diagnoses and all orders for this visit:    1. Pain of right sacroiliac joint  -     REFERRAL TO PHYSICAL THERAPY    2. Rib pain on right side-discussed exam today cw ms pain no sign of hip or abdominal pathology  Encouraged motrin 400 mg tid with food and heat and pursue pt  Ok to get shots as planned with dr Doylene Bode  appt in  3mo    3. Fall, initial encounter

## 2020-03-12 ENCOUNTER — Encounter: Attending: Internal Medicine | Primary: Internal Medicine

## 2020-05-05 NOTE — Telephone Encounter (Signed)
Pt calling states she wants to speak with Dr.Glynn regarding her back issue she has been having. Pt states she went to see two doctors and needs help making a decision on what to do. Please call back and advise

## 2020-05-05 NOTE — Telephone Encounter (Signed)
Patient states she has been seeing Dr.DePalma with [facility] who has done several steroid injections with no decrease in pain relief. Patient went to [facility] ortho who suggested her Kyphoplasty was not done correctly & the surgeon would like to correct it. Patient would like PCP's opinion on this. Suggested she schedule a VV to discuss so all questions can be answered. Patient agreed to visit tomorrow at 2:45.

## 2020-05-06 ENCOUNTER — Telehealth: Payer: MEDICARE | Attending: Internal Medicine | Primary: Internal Medicine

## 2020-05-06 NOTE — Progress Notes (Signed)
She elected to postpone this visit until the next day due to wanting ot use a different phone for the virtual visit

## 2020-05-07 ENCOUNTER — Telehealth: Admit: 2020-05-07 | Payer: MEDICARE | Attending: Internal Medicine | Primary: Internal Medicine

## 2020-05-07 ENCOUNTER — Telehealth: Attending: Internal Medicine | Primary: Internal Medicine

## 2020-05-07 DIAGNOSIS — M545 Low back pain, unspecified: Secondary | ICD-10-CM

## 2020-05-07 NOTE — Progress Notes (Signed)
Consent: Lindsay [name], who was seen by synchronous (real-time) audio-video technology, and/or her healthcare decision maker, is aware that this patient-initiated, Telehealth encounter on 05/07/2020 is a billable service, with coverage as determined by her insurance carrier. She is aware that she may receive a bill and has provided verbal consent to proceed: YES  712  Subjective:   Lindsay [name] is a 64 y.o. female who was seen for Procedure (Broke back on June 4th. Saw doctor at [facility], had blood work, x-ray and MRI. Recommended cortisone shots but states they didnt help. Had nerve block on 05/01/2020, went back on 05/02/2020 and did 3 additional nerve blocks. Dr. Luan Moore operation.)      Comes in for my opinion on management of back pain.  She did suffer a T12 thoracic fracture in June after a fall, did undergo kyphoplasty.  Has continued to have back pain, has been working with Dr. Doylene Bode and has had numerous epidural steroid injections and nerve blocks, which have not helped with her pain, which she currently feels right low back, but radiating around towards the front.  Does not radiate to her leg.  It is very debilitating.  Dr. Zenaida Niece at [facility] has suggested that she needs a redo of the surgery to her lower thoracic spine, believing that this is all radiating from that area and that she may need a plate put in.  She wants my opinion.  Did discuss that clearly the injections have not worked and I would not recommend continuing with those.  Suggested that she continue to work with Dr. Zenaida Niece.  Did discuss that back pain is not always completely resolved with surgery and she should be aware of this.  She is also clearly depressed.  Her psychiatrist, Dr. Salley Hews, has retired and she has not yet established with a new provider.  Discussed options for new providers, strongly encouraged her to work on getting an appointment.  Offered to see her for follow up in the interim in the office until she can  get in with a new psychiatrist.        Current Outpatient Medications   Medication Sig   ??? calcitonin, salmon, (MIACALCIN) nasal INSTILL 1 SPRAY INTO ONE NOSTRIL EVERY DAY FOR 1 WEEK   ??? Vitamin D3 125 mcg (5,000 unit) tab tablet TAKE 1 TABLET BY MOUTH DAILY   ??? omeprazole (PRILOSEC OTC) 20 mg tablet Take 20 mg by mouth.   ??? gabapentin enacarbil (Horizant) 600 mg TbER Take 600 mg by mouth two (2) times a day. Max Daily Amount: 1,200 mg.   ??? rOPINIRole (REQUIP) 1 mg tablet TAKE 1 TABLET AT 1-2PM AND TAKE 3 TABLETS BY MOUTH 1 TO 3 HOURS BEFORE BEDTIME EVERYDAY   ??? lidocaine (Lidocaine Pain Relief) 4 % patch 1 Patch by TransDERmal route every twelve (12) hours every twelve (12) hours.   ??? methocarbamoL (Robaxin-750) 750 mg tablet Take 1 Tablet by mouth four (4) times daily as needed for Muscle Spasm(s).   ??? atorvastatin (LIPITOR) 10 mg tablet Take 1 Tab by mouth daily.   ??? diazePAM (VALIUM) 5 mg tablet 5 mg every eight (8) hours as needed.   ??? buPROPion XL (WELLBUTRIN XL) 150 mg tablet 150 mg two (2) times a day. Take 1 tab BID   ??? aspirin 81 mg chewable tablet Take 1 Tab by mouth daily.   ??? escitalopram oxalate (LEXAPRO) 20 mg tablet Take 1 Tab by mouth daily.   ??? omeprazole (PRILOSEC) 20  mg capsule Take 20 mg by mouth nightly.     No current facility-administered medications for this visit.       No Known Allergies    Past Medical History:   Diagnosis Date   ??? Abnormal brain scan    ??? Anxiety disorder    ??? Arthritis    ??? Chronic pain    ??? Depression    ??? DVT (deep venous thrombosis) (HCC)    ??? Falls frequently    ??? Fracture of left ankle    ??? Headache(784.0) 2011   ??? Ill-defined condition 09/15/2015    FX R HAND   ??? 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 Other and unspecified hyperlipidemia 12/27/2012   ??? PE (pulmonary embolism) 12/2012   ??? RLS (restless legs syndrome) 03/16/2016    Dr Scarlett Presto   ??? TIA (transient ischemic attack) 03/2016    Right facial droop, numbness in lip   ??? Torn rotator cuff     right        ROS  All other systems reviewed and negative, unless mentioned in HPI    Objective:   Vital Signs: (As obtained by patient/caregiver at home)  There were no vitals taken for this visit.     [INSTRUCTIONS:  "[x] " Indicates a positive item  "[] " Indicates a negative item  -- DELETE ALL ITEMS NOT EXAMINED]    Constitutional: [x]  Appears well-developed and well-nourished [x]  No apparent distress      []  Abnormal -     Mental status: [x]  Alert and awake  [x]  Oriented to person/place/time [x]  Able to follow commands    []  Abnormal -     Eyes:   EOM    [x]   Normal    []  Abnormal -   Sclera  [x]   Normal    []  Abnormal -          Discharge [x]   None visible   []  Abnormal -     HENT: [x]  Normocephalic, atraumatic  []  Abnormal -       External Ears [x]  Normal  []  Abnormal -    Neck: [x]  No visualized mass []  Abnormal -     Pulmonary/Chest: [x]  Respiratory effort normal   [x]  No visualized signs of difficulty breathing or respiratory distress        []  Abnormal -      Musculoskeletal:            [x]  Normal range of motion of neck        []  Abnormal -     Neurological:        [x]  No Facial Asymmetry (Cranial nerve 7 motor function) (limited exam due to video visit)          [x]  No gaze palsy        []  Abnormal -          Skin:        [x]  No significant exanthematous lesions or discoloration noted on facial skin         []  Abnormal -            Psychiatric:       [x]  Normal Affect []  Abnormal -           Other pertinent observable physical exam findings:-        Assessment & Plan:   Diagnoses and all orders for this visit:    1. Chronic right-sided low back pain without sciatica  2. S/P kyphoplasty    3. Depression, unspecified depression type    Discussion as above  Encouraged establish with new psychiatrist and also happy to see her in office for  Med follow up for interim until she can establish  Cont with dr Zenaida Niece ortho        We discussed the expected course, resolution and complications of the diagnosis(es) in detail.   Medication risks, benefits, costs, interactions, and alternatives were discussed as indicated.  I advised her to contact the office if her condition worsens, changes or fails to improve as anticipated. She expressed understanding with the diagnosis(es) and plan.     CYNIA ABRUZZO is a 64 y.o. female being evaluated by a video visit encounter for concerns as above.  A caregiver was present when appropriate. Due to this being a Scientist, research (medical) (During COVID-19 public health emergency), evaluation of the following organ systems was limited: Vitals/Constitutional/EENT/Resp/CV/GI/GU/MS/Neuro/Skin/Heme-Lymph-Imm.  Pursuant to the emergency declaration under the [facility] Act and the [employer], 1135 waiver authority and the [employer] Act, this Virtual  Visit was conducted, with patient's (and/or legal guardian's) consent, to reduce the patient's risk of exposure to COVID-19 and provide necessary medical care.     Services were provided through a video synchronous discussion virtually to substitute for in-person clinic visit.   Patient and provider were located at their individual homes.

## 2020-05-22 ENCOUNTER — Encounter

## 2020-05-23 MED ORDER — DIAZEPAM 5 MG TAB
5 mg | ORAL_TABLET | ORAL | 0 refills | Status: DC
Start: 2020-05-23 — End: 2020-06-06

## 2020-05-28 NOTE — Telephone Encounter (Signed)
Patient states she is out of Bupropion and has two pills of the Lexapro - has appointment coming up 12/14 at 130 PM

## 2020-06-06 ENCOUNTER — Encounter

## 2020-06-06 MED ORDER — DIAZEPAM 5 MG TAB
5 mg | ORAL_TABLET | ORAL | 0 refills | Status: DC
Start: 2020-06-06 — End: 2020-07-05

## 2020-06-17 ENCOUNTER — Ambulatory Visit: Admit: 2020-06-17 | Payer: MEDICARE | Attending: Internal Medicine | Primary: Internal Medicine

## 2020-06-17 ENCOUNTER — Ambulatory Visit: Attending: Internal Medicine | Primary: Internal Medicine

## 2020-06-17 DIAGNOSIS — F339 Major depressive disorder, recurrent, unspecified: Secondary | ICD-10-CM

## 2020-06-17 MED ORDER — DULOXETINE 30 MG CAP, DELAYED RELEASE
30 mg | ORAL_CAPSULE | Freq: Every day | ORAL | 0 refills | Status: DC
Start: 2020-06-17 — End: 2020-07-02

## 2020-06-17 MED ORDER — ESCITALOPRAM 10 MG TAB
10 mg | ORAL_TABLET | Freq: Every day | ORAL | 0 refills | Status: DC
Start: 2020-06-17 — End: 2020-07-02

## 2020-06-17 NOTE — Progress Notes (Signed)
HISTORY OF PRESENT ILLNESS  Lindsay [name] is a 64 y.o. female.  HPI  Lindsay [name] was working with Dr. Salley Hews, but he has retired.  Her therapist has also retired. She is feeling a lot of depression.  Notes both Lindsay [name]'s illness in the spring and her brother died of COVID in 03-31-2023.  She has also been coping with chronic back pain, has had numerous nerve blocks and kyphoplasty and Dr. Raylene Miyamoto has suggested that she needs redo surgery for her back.  She notes every day she feels dark, but has not had any active plans for self-harm and states she would not want to do that to her husband and children.  Feels exhausted and overwhelmed.  Currently on Lexapro 20 and Wellbutrin 150 b.i.d., Valium 5 mg, one in the morning and two at night.      Review of Systems   Constitutional: Positive for malaise/fatigue.   Musculoskeletal: Positive for back pain.   Psychiatric/Behavioral: Positive for depression and memory loss. Negative for hallucinations, substance abuse and suicidal ideas. The patient does not have insomnia.        Physical Exam  Vitals and nursing note reviewed.   Constitutional:       Appearance: She is well-developed.   HENT:      Head: Normocephalic and atraumatic.   Neck:      Thyroid: No thyromegaly.      Vascular: No carotid bruit.   Cardiovascular:      Rate and Rhythm: Normal rate and regular rhythm.      Heart sounds: Normal heart sounds, S1 normal and S2 normal. No murmur heard.      Pulmonary:      Effort: Pulmonary effort is normal. No respiratory distress.      Breath sounds: Normal breath sounds. No wheezing or rales.   Musculoskeletal:      Cervical back: Normal range of motion and neck supple.   Neurological:      Mental Status: She is alert and oriented to person, place, and time.   Psychiatric:         Behavior: Behavior normal.         ASSESSMENT and PLAN  Diagnoses and all orders for this visit:    1. Recurrent depression (HCC)  -     DULoxetine (CYMBALTA) 30 mg capsule; Take 1 Capsule by  mouth daily.  -     escitalopram oxalate (LEXAPRO) 10 mg tablet; Take 1 Tablet by mouth daily. 1 po every day for 7 days then 1/2 every day for 7 days then stop it    2. Chronic bilateral low back pain, unspecified whether sciatica present    3. S/P kyphoplasty      the following changes in treatment are made: begin transitions from lexapro to cymbalta for mood and pain  appt in 3 weeks   Cont wellbutrin and valium for now

## 2020-06-25 NOTE — Telephone Encounter (Signed)
Pt calling states she has a few question to ask the nurse about her anti-depression medications. Please call back and advise

## 2020-06-25 NOTE — Telephone Encounter (Signed)
Spoke with pt and she states she misread her depression medications wrong. She stopped the lexapro completely and started cymbalta on 12/18 and is having some withdrawal symtpoms. Spoke with Dr Darl Pikes and she wants pt to continue with the cymbalta daily and take half tablet of the lexapro 10mg  (so 5mg ) for the next 5 days in combination with the cymbalta. Pt has F/U apt on 12/29. Pt states understanding.

## 2020-07-02 ENCOUNTER — Ambulatory Visit: Admit: 2020-07-02 | Payer: MEDICARE | Attending: Internal Medicine | Primary: Internal Medicine

## 2020-07-02 ENCOUNTER — Ambulatory Visit: Attending: Internal Medicine | Primary: Internal Medicine

## 2020-07-02 DIAGNOSIS — Z23 Encounter for immunization: Secondary | ICD-10-CM

## 2020-07-02 DIAGNOSIS — F339 Major depressive disorder, recurrent, unspecified: Secondary | ICD-10-CM

## 2020-07-02 MED ORDER — DULOXETINE 60 MG CAP, DELAYED RELEASE
60 mg | ORAL_CAPSULE | Freq: Every day | ORAL | 2 refills | Status: DC
Start: 2020-07-02 — End: 2020-09-30

## 2020-07-02 NOTE — Progress Notes (Signed)
HISTORY OF PRESENT ILLNESS  Lindsay [name] is a 64 y.o. female.  HPI  Two and a half week follow up.  She tapered off Lexapro and onto Duloxetine 30.  She has been on Duloxetine daily now for about five days and does feel it is helping in that she seems to be accepting her brother's death and not feeling overly down about this.  She still has days of feeling sad.  She had one night of not sleeping well, but last night slept fine. She has had a couple days of nausea around Christmas, but this has resolved. She denies constipation.  She did have another fall Christmas day.  She has had recurrent falls prior to changing meds.  Asks my opinion if I think it is from the meds and I think this is unlikely as she has had numerous falls over the last few years.  Suggested working with the gait center. She declines for now.      Review of Systems   Constitutional: Positive for malaise/fatigue. Negative for weight loss.   Musculoskeletal: Positive for falls.   Psychiatric/Behavioral: Positive for depression and memory loss. Negative for suicidal ideas. The patient is not nervous/anxious and does not have insomnia.        Physical Exam  Vitals and nursing note reviewed.   Constitutional:       Appearance: She is well-developed.   Neurological:      Mental Status: She is alert and oriented to person, place, and time.   Psychiatric:         Behavior: Behavior normal.         Thought Content: Thought content normal.         Judgment: Judgment normal.         ASSESSMENT and PLAN  Diagnoses and all orders for this visit:    1. Recurrent depression (HCC)-does seem to have some benefit  From transition from ssri  Inc to 60/30 mg  cymbalta every other day until done with the 30 mgs ( about 1 week) then continue on 60 mg every day  appt in end of feb  -     DULoxetine (CYMBALTA) 60 mg capsule; Take 1 Capsule by mouth daily.    2. Needs flu shot  -     FLU (FLUAD QUAD INFLUENZA VACCINE,QUAD,ADJUVANTED)    3. Recurrent falls-advised  working with t he gait center-she declines today

## 2020-07-05 ENCOUNTER — Encounter

## 2020-07-05 MED ORDER — DIAZEPAM 5 MG TAB
5 mg | ORAL_TABLET | ORAL | 2 refills | Status: DC
Start: 2020-07-05 — End: 2020-10-08

## 2020-07-11 ENCOUNTER — Ambulatory Visit: Admit: 2020-07-11 | Payer: MEDICARE | Attending: Neurology | Primary: Internal Medicine

## 2020-07-11 ENCOUNTER — Ambulatory Visit: Attending: Neurology | Primary: Internal Medicine

## 2020-07-11 DIAGNOSIS — G2581 Restless legs syndrome: Secondary | ICD-10-CM

## 2020-07-11 MED ORDER — HORIZANT ER 600 MG TABLET,EXTENDED RELEASE
600 mg | ORAL_TABLET | Freq: Two times a day (BID) | ORAL | 1 refills | Status: DC
Start: 2020-07-11 — End: 2020-09-02

## 2020-07-11 MED ORDER — HORIZANT ER 600 MG TABLET,EXTENDED RELEASE
600 mg | ORAL_TABLET | Freq: Two times a day (BID) | ORAL | 1 refills | Status: DC
Start: 2020-07-11 — End: 2020-07-11

## 2020-07-11 MED ORDER — ROPINIROLE 1 MG TAB
1 mg | ORAL_TABLET | ORAL | 1 refills | Status: DC
Start: 2020-07-11 — End: 2020-11-13

## 2020-07-11 NOTE — Progress Notes (Signed)
Neurology Progress Note    HISTORY PROVIDED BY: patient    Chief Complaint:   Chief Complaint   Patient presents with   ??? Follow-up     RLS      Subjective:   Pt is a 65y.o. right handed female last seen virtually on 01/09/20 in f/u, has h/o radiographic changes c/w MS, but no clinical symptoms, stable MRI's since 2011, the last MRI brain 02/17/17. Additionally, has RLS, with an ill-defined irritating sensation in her legs with urge to move her legs beginning around 6PM and keeping her awake and inhibiting her activities, though question if there may be some component of psych issues/akasthesia contributing to her need to walk. RLS well controlled on Horizant '600mg'$  bid and Requip '4mg'$  daily. Ongoing falls due to poor decision making.  Exam was severely limited due to VV with pt riding in a car.    -Continued Horizant '600mg'$  bid and Requip '4mg'$  qhs for RLS,     -Continued ASA '81mg'$  daily and Pravachol.     She returns for f/u. She had a fractured vertebra in June, seen by Dr. Dema Severin at [place].  She believes he took her off Lipitor, but not certain why. She was started on Robaxin, taking this at night only if really needs it. She was admitted to hospital for kyphoplasty on vertebra after her fall due to severe pain. Dr. Cephas Darby did several pain interventions.  Dr. Lucianne Lei at [facility] wants to do surgery on her spine.  Her psychiatrist and therapist both retired, so her PCP is managing psych meds. She is taking Cymbalta, Wellbutrin, and Valium 5 mg in AM and '10mg'$  in PM.  She has had another fall, 06/26/20, believes due to changes in meds, states she was not taking them like her PCP told her to. She was going down one step into kitchen and hit head on cabinet and had a "concussion" for Christmas. She is walking bent over due to spine issue and this throws her off balance and will fall into her closet or somewhere soft, no injuries. Fell in Sept, 2021 walking quickly around bed to bathroom door, reached for door which she though  was closed and it swung forward and she fell fx a bone in elbow.  Had a fall in October, doesn't recall details, hurt ankle. RLS sxs are improved.      Previous testing:  In past, c/o memory loss, suspected due to underlying mood disorder/personality d/o, and stress, and this suspicion was confirmed on neuropsychological testing with Dr. Tennis Ship in April, 2019.    Past Medical History:   Diagnosis Date   ??? Abnormal brain scan    ??? Anxiety disorder    ??? Arthritis    ??? Chronic pain    ??? Depression    ??? DVT (deep venous thrombosis) ([place])    ??? Falls frequently    ??? Fracture of left ankle    ??? Headache(784.0) 2011   ??? Ill-defined condition 09/15/2015    FX R HAND   ??? 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 Other and unspecified hyperlipidemia 12/27/2012   ??? PE (pulmonary embolism) 12/2012   ??? RLS (restless legs syndrome) 03/16/2016    Dr Roseanne Reno   ??? TIA (transient ischemic attack) 03/2016    Right facial droop, numbness in lip   ??? Torn rotator cuff     right      Past Surgical History:   Procedure Laterality Date   ??? HX APPENDECTOMY     ???  HX CESAREAN SECTION      x4   ??? HX GI      COLONOSCOPY   ??? 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 HX ORTHOPAEDIC  08/2004    rotater cuff r. shoulder   ??? HX ORTHOPAEDIC      left ankle fracture treated with air cast   ??? HX ORTHOPAEDIC  6/13    left shoulder fxt and dislocation   ??? HX OTHER SURGICAL  2010    left foot torn tendon   ??? HX TUBAL LIGATION     ??? IMPLANT BREAST SILICONE/EQ  0630    Bilateral breast implants   ??? IR KYPHOPLASTY THORACIC  12/12/2019   ??? PR BREAST SURGERY PROCEDURE UNLISTED  2002    AUGMENTATION      Social History     Socioeconomic History   ??? Marital status: MARRIED     Spouse name: Not on file   ??? Number of children: Not on file   ??? Years of education: Not on file   ??? Highest education level: Not on file   Occupational History   ??? Not on file   Tobacco Use   ??? Smoking status: Never Smoker   ??? Smokeless tobacco: Never Used   Vaping Use   ??? Vaping Use: Never used    Substance and Sexual Activity   ??? Alcohol use: No     Comment: Pt denies   ??? Drug use: No   ??? Sexual activity: Yes     Partners: Male   Other Topics Concern   ??? Not on file   Social History Narrative   ??? Not on file     Social Determinants of Health     Financial Resource Strain:    ??? Difficulty of Paying Living Expenses: Not on file   Food Insecurity:    ??? Worried About Running Out of Food in the Last Year: Not on file   ??? Ran Out of Food in the Last Year: Not on file   Transportation Needs:    ??? Lack of Transportation (Medical): Not on file   ??? Lack of Transportation (Non-Medical): Not on file   Physical Activity:    ??? Days of Exercise per Week: Not on file   ??? Minutes of Exercise per Session: Not on file   Stress:    ??? Feeling of Stress : Not on file   Social Connections:    ??? Frequency of Communication with Friends and Family: Not on file   ??? Frequency of Social Gatherings with Friends and Family: Not on file   ??? Attends Religious Services: Not on file   ??? Active Member of Clubs or Organizations: Not on file   ??? Attends Archivist Meetings: Not on file   ??? Marital Status: Not on file   Intimate Partner Violence:    ??? Fear of Current or Ex-Partner: Not on file   ??? Emotionally Abused: Not on file   ??? Physically Abused: Not on file   ??? Sexually Abused: Not on file   Housing Stability:    ??? Unable to Pay for Housing in the Last Year: Not on file   ??? Number of Places Lived in the Last Year: Not on file   ??? Unstable Housing in the Last Year: Not on file     Family History   Problem Relation Age of Onset   ??? Heart Disease Mother    ??? Heart Disease Father    ???  Cancer Father         thyroid cancer   ??? Arthritis-rheumatoid Sister    ??? No Known Problems Son    ??? No Known Problems Daughter    ??? No Known Problems Daughter    ??? No Known Problems Daughter    ??? Anesth Problems Neg Hx           Objective:   ROS:  Per HPI or PMH o/w reviewed and neg    No Known Allergies    Meds:  Outpatient Medications Prior to Visit    Medication Sig Dispense Refill   ??? diazePAM (VALIUM) 5 mg tablet TAKE 1 TABLET BY MOUTH IN THE MORNING & 2 TABLET IN THE EVENING DAILY 90 Tablet 2   ??? DULoxetine (CYMBALTA) 60 mg capsule Take 1 Capsule by mouth daily. 30 Capsule 2   ??? Vitamin D3 125 mcg (5,000 unit) tab tablet TAKE 1 TABLET BY MOUTH DAILY     ??? gabapentin enacarbil (Horizant) 600 mg TbER Take 600 mg by mouth two (2) times a day. Max Daily Amount: 1,200 mg. 180 Tablet 1   ??? rOPINIRole (REQUIP) 1 mg tablet TAKE 1 TABLET AT 1-2PM AND TAKE 3 TABLETS BY MOUTH 1 TO 3 HOURS BEFORE BEDTIME EVERYDAY 360 Tablet 1   ??? lidocaine (Lidocaine Pain Relief) 4 % patch 1 Patch by TransDERmal route every twelve (12) hours every twelve (12) hours. 10 Patch 0   ??? methocarbamoL (Robaxin-750) 750 mg tablet Take 1 Tablet by mouth four (4) times daily as needed for Muscle Spasm(s). 20 Tablet 0   ??? buPROPion XL (WELLBUTRIN XL) 150 mg tablet 150 mg two (2) times a day. Take 1 tab BID  0   ??? aspirin 81 mg chewable tablet Take 1 Tab by mouth daily. 30 Tab 0   ??? omeprazole (PRILOSEC OTC) 20 mg tablet Take 20 mg by mouth. (Patient not taking: Reported on 07/11/2020)     ??? atorvastatin (LIPITOR) 10 mg tablet Take 1 Tab by mouth daily. (Patient not taking: Reported on 06/17/2020) 30 Tab 3   ??? omeprazole (PRILOSEC) 20 mg capsule Take 20 mg by mouth nightly. (Patient not taking: Reported on 07/11/2020)       No facility-administered medications prior to visit.       Imaging:  MRI Results (most recent):  Results from Hospital Encounter encounter on 12/11/19    MRI [facility] SPINE WO CONT    Narrative  EXAM: MRI [facility] SPINE WO CONT, MRI LUMB SPINE WO CONT    INDICATION: t12 compression fx on 6/3, r/o cord compression.    COMPARISON: Lumbar spine CT dated 12/06/2019    TECHNIQUE: MR imaging of the thoracic and lumbar spine was performed using the  following sequences: sagittal T1, T2, stir; axial T1, T2. Additional coronal T2  images were obtained through the lumbar spine    CONTRAST:  None.    FINDINGS:    Comparison prior study, there is increased loss of height of the T12 vertebral  body with increased bony retropulsion. Superior endplate compression fracture of  L4 is also noted which is likely chronic. There is moderate severe canal  stenosis at T12 with moderate bilateral foraminal narrowing. Possible mild  compression on the spinal cord. No definite abnormal signal is identified in the  conus.    The remaining thoracic spine levels are unremarkable.    T12-L1 demonstrates minimal disc bulge without canal stenosis or foraminal  narrowing.    L1-2 demonstrates no  evidence of canal stenosis or foraminal narrowing.    At L2-3, there is mild facet hypertrophy without canal stenosis or foraminal  narrowing.    At L3-4, there is a mild concentric disc bulge and facet hypertrophy with  minimal canal stenosis and minimal bilateral foraminal narrowing.    L4-5 there is severe facet hypertrophy and ligament flavum hypertrophy as well  with a mild disc bulge causing mild canal stenosis. No significant foraminal  narrowing.    L5-S1, there is concentric bulge with osteophyte formation and a probable small  central disc extrusion with cranial migration. Overall canal stenosis is  minimal. The neural foramen are patent.    Incidental soft tissue imaging is unremarkable    Impression  1.  Increase loss of height at T12 with increased bony retropulsion and moderate  severe canal stenosis. Possible compression on the cord although no abnormal  cord signal is identified.    2.  Other degenerative changes in the lumbar spine as described.    3.  Mild superior endplate compression deformity of L4 with a chronic     CT Results (most recent):  Results from Hospital Encounter encounter on 12/16/19    CT ABD PELV WO CONT    Narrative  EXAM: CT ABD PELV WO CONT    INDICATION: Right groin, right lower quadrant and bilateral pelvic pain    COMPARISON: None    CONTRAST:  None.    TECHNIQUE:  Thin axial images were  obtained through the abdomen and pelvis. Coronal and  sagittal reformats were generated. Oral contrast was not administered. CT dose  reduction was achieved through use of a standardized protocol tailored for this  examination and automatic exposure control for dose modulation.    The absence of intravenous contrast material reduces the sensitivity for  evaluation of the vasculature and solid organs.    FINDINGS:  LOWER THORAX: No significant abnormality in the incidentally imaged lower chest.  Bilateral breast implants  LIVER: No mass.  BILIARY TREE: Gallbladder is within normal limits. CBD is not dilated.  SPLEEN: within normal limits.  PANCREAS: No focal abnormality.  ADRENALS: Unremarkable.  KIDNEYS/URETERS: No calculus or hydronephrosis.  STOMACH: Unremarkable.  SMALL BOWEL: No dilatation or wall thickening.  COLON: No dilatation or wall thickening.  APPENDIX: Appendectomy  PERITONEUM: No ascites or pneumoperitoneum.  RETROPERITONEUM: No lymphadenopathy or aortic aneurysm.  REPRODUCTIVE ORGANS: Tubal ligation  URINARY BLADDER: No mass or calculus.  BONES: No destructive bone lesion.  ABDOMINAL WALL: No mass or hernia.  ADDITIONAL COMMENTS: N/A    Impression  No acute findings seen.       Reviewed records in [facility] and media tab today    Lab Review   Results for orders placed or performed during the hospital encounter of 12/11/19   CBC WITH AUTOMATED DIFF   Result Value Ref Range    WBC 11.5 (H) 3.6 - 11.0 K/uL    RBC 3.92 3.80 - 5.20 M/uL    HGB 10.1 (L) 11.5 - 16.0 g/dL    HCT 32.5 (L) 35.0 - 47.0 %    MCV 82.9 80.0 - 99.0 FL    MCH 25.8 (L) 26.0 - 34.0 PG    MCHC 31.1 30.0 - 36.5 g/dL    RDW 14.3 11.5 - 14.5 %    PLATELET 236 150 - 400 K/uL    MPV 9.3 8.9 - 12.9 FL    NRBC 0.0 0 PER 100 WBC    ABSOLUTE NRBC 0.00 0.00 -  0.01 K/uL    NEUTROPHILS 57 32 - 75 %    LYMPHOCYTES 29 12 - 49 %    MONOCYTES 9 5 - 13 %    EOSINOPHILS 4 0 - 7 %    BASOPHILS 0 0 - 1 %    IMMATURE GRANULOCYTES 1 (H) 0.0 - 0.5 %    ABS.  NEUTROPHILS 6.5 1.8 - 8.0 K/UL    ABS. LYMPHOCYTES 3.3 0.8 - 3.5 K/UL    ABS. MONOCYTES 1.1 (H) 0.0 - 1.0 K/UL    ABS. EOSINOPHILS 0.5 (H) 0.0 - 0.4 K/UL    ABS. BASOPHILS 0.1 0.0 - 0.1 K/UL    ABS. IMM. GRANS. 0.1 (H) 0.00 - 0.04 K/UL    DF AUTOMATED     METABOLIC PANEL, COMPREHENSIVE   Result Value Ref Range    Sodium 136 136 - 145 mmol/L    Potassium 4.1 3.5 - 5.1 mmol/L    Chloride 104 97 - 108 mmol/L    CO2 29 21 - 32 mmol/L    Anion gap 3 (L) 5 - 15 mmol/L    Glucose 85 65 - 100 mg/dL    BUN 23 (H) 6 - 20 MG/DL    Creatinine 0.74 0.55 - 1.02 MG/DL    BUN/Creatinine ratio 31 (H) 12 - 20      GFR est AA >60 >60 ml/min/1.2m    GFR est non-AA >60 >60 ml/min/1.790m   Calcium 9.4 8.5 - 10.1 MG/DL    Bilirubin, total 0.4 0.2 - 1.0 MG/DL    ALT (SGPT) 25 12 - 78 U/L    AST (SGOT) 16 15 - 37 U/L    Alk. phosphatase 98 45 - 117 U/L    Protein, total 7.0 6.4 - 8.2 g/dL    Albumin 3.6 3.5 - 5.0 g/dL    Globulin 3.4 2.0 - 4.0 g/dL    A-G Ratio 1.1 1.1 - 2.2     URINALYSIS W/ REFLEX CULTURE    Specimen: Urine   Result Value Ref Range    Color YELLOW/STRAW      Appearance CLEAR CLEAR      Specific gravity 1.005 1.003 - 1.030      pH (UA) 6.5 5.0 - 8.0      Protein Negative NEG mg/dL    Glucose Negative NEG mg/dL    Ketone Negative NEG mg/dL    Bilirubin Negative NEG      Blood Negative NEG      Urobilinogen 0.2 0.2 - 1.0 EU/dL    Nitrites Negative NEG      Leukocyte Esterase Negative NEG      WBC 0-4 0 - 4 /hpf    RBC 0-5 0 - 5 /hpf    Epithelial cells FEW FEW /lpf    Bacteria Negative NEG /hpf    UA:UC IF INDICATED CULTURE NOT INDICATED BY UA RESULT CNI      Hyaline cast 0-2 0 - 5 /lpf   METABOLIC PANEL, COMPREHENSIVE   Result Value Ref Range    Sodium 137 136 - 145 mmol/L    Potassium 3.9 3.5 - 5.1 mmol/L    Chloride 103 97 - 108 mmol/L    CO2 32 21 - 32 mmol/L    Anion gap 2 (L) 5 - 15 mmol/L    Glucose 86 65 - 100 mg/dL    BUN 20 6 - 20 MG/DL    Creatinine 0.73 0.55 - 1.02 MG/DL    BUN/Creatinine ratio 27 (  H) 12 - 20       GFR est AA >60 >60 ml/min/1.31m    GFR est non-AA >60 >60 ml/min/1.778m   Calcium 8.0 (L) 8.5 - 10.1 MG/DL    Bilirubin, total 0.3 0.2 - 1.0 MG/DL    ALT (SGPT) 21 12 - 78 U/L    AST (SGOT) 12 (L) 15 - 37 U/L    Alk. phosphatase 99 45 - 117 U/L    Protein, total 6.4 6.4 - 8.2 g/dL    Albumin 3.2 (L) 3.5 - 5.0 g/dL    Globulin 3.2 2.0 - 4.0 g/dL    A-G Ratio 1.0 (L) 1.1 - 2.2     CBC WITH AUTOMATED DIFF   Result Value Ref Range    WBC 7.8 3.6 - 11.0 K/uL    RBC 4.02 3.80 - 5.20 M/uL    HGB 10.6 (L) 11.5 - 16.0 g/dL    HCT 34.8 (L) 35.0 - 47.0 %    MCV 86.6 80.0 - 99.0 FL    MCH 26.4 26.0 - 34.0 PG    MCHC 30.5 30.0 - 36.5 g/dL    RDW 14.5 11.5 - 14.5 %    PLATELET 211 150 - 400 K/uL    MPV 9.0 8.9 - 12.9 FL    NRBC 0.0 0 PER 100 WBC    ABSOLUTE NRBC 0.00 0.00 - 0.01 K/uL    NEUTROPHILS 51 32 - 75 %    LYMPHOCYTES 33 12 - 49 %    MONOCYTES 9 5 - 13 %    EOSINOPHILS 6 0 - 7 %    BASOPHILS 1 0 - 1 %    IMMATURE GRANULOCYTES 0 0.0 - 0.5 %    ABS. NEUTROPHILS 4.0 1.8 - 8.0 K/UL    ABS. LYMPHOCYTES 2.5 0.8 - 3.5 K/UL    ABS. MONOCYTES 0.7 0.0 - 1.0 K/UL    ABS. EOSINOPHILS 0.5 (H) 0.0 - 0.4 K/UL    ABS. BASOPHILS 0.0 0.0 - 0.1 K/UL    ABS. IMM. GRANS. 0.0 0.00 - 0.04 K/UL    DF AUTOMATED     HGB & HCT   Result Value Ref Range    HGB 10.0 (L) 11.5 - 16.0 g/dL    HCT 33.9 (L) 35.0 - 47.0 %   HGB & HCT   Result Value Ref Range    HGB 9.7 (L) 11.5 - 16.0 g/dL    HCT 31.8 (L) 35.0 - 47.0 %   HGB & HCT   Result Value Ref Range    HGB 9.6 (L) 11.5 - 16.0 g/dL    HCT 31.7 (L) 35.0 - 47.0 %   HGB & HCT   Result Value Ref Range    HGB 9.0 (L) 11.5 - 16.0 g/dL    HCT 30.2 (L) 35.0 - 47.0 %      .  Exam:  Visit Vitals  BP 110/70   Pulse 95   Ht '5\' 3"'  (1.6 m)   Wt 153 lb (69.4 kg)   SpO2 97%   BMI 27.10 kg/m??     General:  Alert, cooperative, no distress.    Head:  Normocephalic, without obvious abnormality, atraumatic.   Respiratory:  Heart:   Non labored breathing  RRR no murmurs   Carotids: 2+ carotids, no bruits    Extremities: Warm, no edema   Pulses: 2+radial pulses       Neurologic:  MS: Alert and oriented x 4, speech intact. Language -  inact. Attention and fund of knowledge appropriate.  Recent and remote memory intact.  Cranial Nerves:  II: visual fields VFF   II: pupils    II: optic disc    III,VII: ptosis Mild right ptosis - stable   III,IV,VI: extraocular muscles  EOMI, no nystagmus seen, no diplopia   V: facial light touch sensation     VII: facial muscle function   symmetric   VIII: hearing intact   IX: soft palate elevation     XI: trapezius strength     XI: sternocleidomastoid strength    XII: tongue       Motor: normal bulk and tone, no tremor, no PD, 5/5 throughout  Sensory: (At previous visits: Intact to LT/PP throughout)  Coordination: FTN and RAM intact  Gait: Cautious gait, normal stride, turns normally  Reflexes: 2+ sym       Assessment/Plan   Pt is a 65y.o. right handed female with radiographic changes c/w MS, but no clinical symptoms, stable MRI's since 2011, the last MRI brain 02/17/17. Additionally, has RLS, with an ill-defined irritating sensation in her legs with urge to move her legs beginning around 6PM and keeping her awake and inhibiting her activities, though question if there may be some component of psych issues/akasthesia contributing to her need to walk. RLS well controlled on Horizant 67m bid and Requip 464mdaily. Ongoing falls of unclear etiologgy.  Exam is non-focal and unremarkable except for cautious gait due to pain.  Discussed possibility of polypharmacy contributing to falls. No neurological etiology found. Recommend trying to decrease Horizant to 60030mn PM only and continue Requip 4mg87ms for RLS,   I recommend she continue ASA 81mg16mly and statin, she is going to check with PCP to see if there was some reason for stopping the statin.  F/u in clinic in 6 months, instructed to call in the interim if needed.     ICD-10-CM ICD-9-CM    1. RLS (restless legs syndrome)  G25.81 333.94  gabapentin enacarbil (Horizant) 600 mg TbER      DISCONTINUED: gabapentin enacarbil (Horizant) 600 mg TbER   2. Falls frequently  R29.6 V15.88        Signed:  Elanore Talcott Chauncey Reading 07/11/2020

## 2020-07-20 NOTE — Telephone Encounter (Signed)
Refill for Requip was sent to [facility] on 07/11/20.

## 2020-07-30 ENCOUNTER — Ambulatory Visit
Admit: 2020-07-30 | Discharge: 2020-07-30 | Payer: MEDICARE | Attending: Physician Assistant | Primary: Internal Medicine

## 2020-07-30 ENCOUNTER — Ambulatory Visit: Attending: Physician Assistant | Primary: Internal Medicine

## 2020-07-30 DIAGNOSIS — S22080K Wedge compression fracture of T11-T12 vertebra, subsequent encounter for fracture with nonunion: Secondary | ICD-10-CM

## 2020-07-30 NOTE — Progress Notes (Signed)
Lindsay [name] (DOB: [birth year]) is a 65 y.o. female patient here for evaluation of the following chief complaint(s):  Back Pain (Sx consult)         ASSESSMENT/PLAN:  Below is the assessment and plan developed based on review of pertinent history, physical exam, labs, studies, and medications.    1. 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 Chronic midline low back pain without sciatica      The patient's radiologic findings have been reviewed with her again in detail today.  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  She was seen in October by Dr. Zenaida Niece and found to be a candidate for a T10-L2 posterior spinal fusion with thoracic laminectomy at T11-12 for decompression of subsequent stenosis as a result of her fracture.  We have had a lengthy discussion regarding her pathology as well as the surgical procedure.  All questions have been answered to the patient's and daughter's satisfaction.  She is ready to be scheduled for surgical intervention, and we will have our nurse reach out to her for further surgical planning.    The risks and benefits were discussed at length with the patient and the patient has elected to proceed.  Indications for surgery include failed conservative treatment.  Alternative treatments, risks and the perioperative course were discussed with the patient.  All questions were answered.  The risks and benefits of the procedure were explained.  Benefits include definitive diagnosis, relief of pain, elimination of deformity and improved function.  Risks of surgery including bleeding, infection, weakness, numbness, CSF leak, failure to improve symptoms, exacerbation of medical co-morbidities and even death were discussed with the patient.      No follow-ups on file.      SUBJECTIVE/OBJECTIVE:  Lindsay [name] (DOB: [birth year]) is a 65 y.o. female who  presents today for the following:  Chief Complaint   Patient presents with   ??? Back Pain     Sx consult        HPI   Lindsay [name] returns today for a follow-up visit.  She was last seen approximately 3 months ago and was recommended for a T10-L2 posterior spinal fusion secondary to chronic T12 compression fracture with nonunion.  She states that her symptoms began after falling off of a stool in June, 2021.  She had a subsequent kyphoplasty that did not substantially relieve her symptoms.  It was recommended that given her persistent pain, combined with kyphosis throughout the T11 and T12 vertebra that she was a good candidate for stabilization of her fracture.  She has continued mid back pain with also low back pain.  She has pain radiating into the right lower abdomen.  No leg pain.  No numbness, tingling, or weakness in the legs.  No bowel or bladder changes.      IMAGING:  XR Results (most recent):       MRI Results (most recent):  Results from Hospital Encounter encounter on 12/11/19    MRI [facility] SPINE WO CONT    Narrative  EXAM: MRI [facility] SPINE WO CONT, MRI LUMB SPINE WO CONT    INDICATION: t12 compression fx on 6/3, r/o cord compression.    COMPARISON: Lumbar spine CT dated 12/06/2019    TECHNIQUE: MR imaging of the thoracic and lumbar spine was performed using the  following sequences: sagittal T1, T2, stir;  axial T1, T2. Additional coronal T2  images were obtained through the lumbar spine    CONTRAST: None.    FINDINGS:    Comparison prior study, there is increased loss of height of the T12 vertebral  body with increased bony retropulsion. Superior endplate compression fracture of  L4 is also noted which is likely chronic. There is moderate severe canal  stenosis at T12 with moderate bilateral foraminal narrowing. Possible mild  compression on the spinal cord. No definite abnormal signal is identified in the  conus.    The remaining thoracic spine levels are unremarkable.    T12-L1 demonstrates minimal disc  bulge without canal stenosis or foraminal  narrowing.    L1-2 demonstrates no evidence of canal stenosis or foraminal narrowing.    At L2-3, there is mild facet hypertrophy without canal stenosis or foraminal  narrowing.    At L3-4, there is a mild concentric disc bulge and facet hypertrophy with  minimal canal stenosis and minimal bilateral foraminal narrowing.    L4-5 there is severe facet hypertrophy and ligament flavum hypertrophy as well  with a mild disc bulge causing mild canal stenosis. No significant foraminal  narrowing.    L5-S1, there is concentric bulge with osteophyte formation and a probable small  central disc extrusion with cranial migration. Overall canal stenosis is  minimal. The neural foramen are patent.    Incidental soft tissue imaging is unremarkable    Impression  1.  Increase loss of height at T12 with increased bony retropulsion and moderate  severe canal stenosis. Possible compression on the cord although no abnormal  cord signal is identified.    2.  Other degenerative changes in the lumbar spine as described.    3.  Mild superior endplate compression deformity of L4 with a chronic       No Known Allergies    Current Outpatient Medications   Medication Sig   ??? gabapentin enacarbil (Horizant) 600 mg TbER Take 600 mg by mouth two (2) times a day. Max Daily Amount: 1,200 mg.   ??? rOPINIRole (REQUIP) 1 mg tablet TAKE 1 TABLET AT 1-2PM AND TAKE 3 TABLETS BY MOUTH 1 TO 3 HOURS BEFORE BEDTIME EVERYDAY   ??? diazePAM (VALIUM) 5 mg tablet TAKE 1 TABLET BY MOUTH IN THE MORNING & 2 TABLET IN THE EVENING DAILY   ??? DULoxetine (CYMBALTA) 60 mg capsule Take 1 Capsule by mouth daily.   ??? Vitamin D3 125 mcg (5,000 unit) tab tablet TAKE 1 TABLET BY MOUTH DAILY   ??? omeprazole (PRILOSEC OTC) 20 mg tablet Take 20 mg by mouth.   ??? lidocaine (Lidocaine Pain Relief) 4 % patch 1 Patch by TransDERmal route every twelve (12) hours every twelve (12) hours.   ??? methocarbamoL (Robaxin-750) 750 mg tablet Take 1 Tablet by  mouth four (4) times daily as needed for Muscle Spasm(s).   ??? atorvastatin (LIPITOR) 10 mg tablet Take 1 Tab by mouth daily.   ??? buPROPion XL (WELLBUTRIN XL) 150 mg tablet 150 mg two (2) times a day. Take 1 tab BID   ??? aspirin 81 mg chewable tablet Take 1 Tab by mouth daily.     No current facility-administered medications for this visit.       Past Medical History:   Diagnosis Date   ??? Abnormal brain scan    ??? Anxiety disorder    ??? Arthritis    ??? Chronic pain    ??? Depression    ??? DVT (deep venous  thrombosis) (HCC)    ??? Falls frequently    ??? Fracture of left ankle    ??? Headache(784.0) 2011   ??? Ill-defined condition 09/15/2015    FX R HAND   ??? 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 Other and unspecified hyperlipidemia 12/27/2012   ??? PE (pulmonary embolism) 12/2012   ??? RLS (restless legs syndrome) 03/16/2016    Dr Scarlett Presto   ??? TIA (transient ischemic attack) 03/2016    Right facial droop, numbness in lip   ??? Torn rotator cuff     right        Past Surgical History:   Procedure Laterality Date   ??? HX APPENDECTOMY     ??? HX CESAREAN SECTION      x4   ??? HX GI      COLONOSCOPY   ??? 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 HX ORTHOPAEDIC  08/2004    rotater cuff r. shoulder   ??? HX ORTHOPAEDIC      left ankle fracture treated with air cast   ??? HX ORTHOPAEDIC  6/13    left shoulder fxt and dislocation   ??? HX OTHER SURGICAL  2010    left foot torn tendon   ??? HX TUBAL LIGATION     ??? IMPLANT BREAST SILICONE/EQ  2002    Bilateral breast implants   ??? IR KYPHOPLASTY THORACIC  12/12/2019   ??? PR BREAST SURGERY PROCEDURE UNLISTED  2002    AUGMENTATION       Family History   Problem Relation Age of Onset   ??? Heart Disease Mother    ??? Heart Disease Father    ??? Cancer Father         thyroid cancer   ??? Arthritis-rheumatoid Sister    ??? No Known Problems Son    ??? No Known Problems Daughter    ??? No Known Problems Daughter    ??? No Known Problems Daughter    ??? Anesth Problems Neg Hx         Social History     Tobacco Use   ??? Smoking status: Never  Smoker   ??? Smokeless tobacco: Never Used   Substance Use Topics   ??? Alcohol use: No     Comment: Pt denies        Review of Systems   Constitutional: Negative.    Respiratory: Negative.    Cardiovascular: Negative.    Gastrointestinal: Negative.    Endocrine: Negative.    Genitourinary: Negative.    Musculoskeletal: Positive for back pain.   Skin: Negative.    Allergic/Immunologic: Negative.    Hematological: Negative.    Psychiatric/Behavioral: Negative.    All other systems reviewed and are negative.       No flowsheet data found.        Vitals:  Ht 5\' 3"  (1.6 m)    Wt 149 lb (67.6 kg)    BMI 26.39 kg/m??    Body mass index is 26.39 kg/m??.    Physical Exam    Neurologic  Sensory  Light Touch - Intact - Globally.  Overall Assessment of Muscle Strength and Tone reveals  Lower Extremities - Right Iliopsoas - 5/5. Left Iliopsoas - 5/5. Right Tibialis Anterior - 5/5. Left Tibialis Anterior - 5/5. Right Gastroc-Soleus - 5/5. Left Gastroc-Soleus - 5/5. Right EHL - 5/5. Left EHL - 5/5.  General Assessment of Reflexes  Right Ankle - Clonus is not present. Left Ankle - Clonus is  not present.  Reflexes (Dermatomes)  2/2 Normal - Left Achilles (L5-S2), Left Knee (L2-4), Right Achilles (L5-S2) and Right Knee (L2-4).    Musculoskeletal  Global Assessment  Examination of related systems reveals - well-developed, well-nourished, in no acute distress, alert and oriented x 3. Gait and Station - normal gait and station and normal posture. Right Lower Extremity - normal strength and tone, normal range of motion without pain and no instability, subluxation or laxity. Left Lower Extremity - normal strength and tone, normal range of motion without pain and no instability, subluxation or laxity.  Spine/Ribs/Pelvis--moderate kyphosis noted at the thoracolumbar junction  Cervical Spine - Examination of the cervical spine reveals - no tenderness to palpation, no pain, no swelling, edema or erythema, normal cervical spine movements and normal  sensation. Thoracic (Dorsal) Spine - Examination of the thoracic spine reveals - no tenderness over thoracic vertebrae, no pain, normal sensation and normal thoracic spine movements. Lumbosacral Spine - Examination of the lumbosacral spine reveals - no known fractures or deformities. Inspection and Palpation - Tenderness - moderate. Assessment of pain reveals the following findings - The pain is characterized as - moderate. Location - pain refers to lower back bilaterally. ROJM - Trunk Extension - 15 degrees. Lumbar Spine Flexion - 35 ??. Lumbosacral Spine - Functional Testing - Babinski Test negative, Prone Knee Bending Test negative, Slump Test negative, Straight Leg Raising Test negative.          Dr. Raylene Miyamoto was available for immediate consult during this encounter.  An electronic signature was used to authenticate this note.  -- Anna Genre, PA

## 2020-08-05 ENCOUNTER — Encounter: Attending: Internal Medicine | Primary: Internal Medicine

## 2020-08-05 ENCOUNTER — Encounter

## 2020-08-26 ENCOUNTER — Encounter

## 2020-09-02 ENCOUNTER — Encounter

## 2020-09-02 MED ORDER — HORIZANT ER 600 MG TABLET,EXTENDED RELEASE
600 mg | ORAL_TABLET | Freq: Two times a day (BID) | ORAL | 1 refills | Status: DC
Start: 2020-09-02 — End: 2021-03-12

## 2020-09-02 NOTE — Telephone Encounter (Signed)
Medication printed instead of going to the pharmacy. I called and left a message for a call back to discuss whether there was a reason it printed and if she got the script in the mail

## 2020-09-02 NOTE — Telephone Encounter (Signed)
 Requested Prescriptions     Pending Prescriptions Disp Refills   . gabapentin enacarbil  (Horizant ) 600 mg TbER 180 Tablet 1     Sig: Take 600 mg by mouth two (2) times a day. Max Daily Amount: 1,200 mg.

## 2020-09-02 NOTE — Telephone Encounter (Signed)
JB - For some reason Horizant does not eprescribe, there is a Data processing manager in [employer].  He doesn't print when I try to eprescribe either.  Please call Rx to her pharmacy.

## 2020-09-03 NOTE — Telephone Encounter (Signed)
Pt called to say that she still has not gotten confirmation about prescription refill. Please call pt when script is called in. [phone]

## 2020-09-03 NOTE — Telephone Encounter (Signed)
I called and left a message of the problem that occurred and called this in to the pharmacy and left on the voicemail

## 2020-09-15 NOTE — Telephone Encounter (Signed)
Lindsay [name] surgery was moved from Monday 09/29/2020 to Thursday October 02 2020. Please adjust the auth. Thanks so much

## 2020-09-24 ENCOUNTER — Encounter: Primary: Internal Medicine

## 2020-09-29 ENCOUNTER — Encounter: Primary: Internal Medicine

## 2020-09-30 ENCOUNTER — Encounter

## 2020-09-30 MED ORDER — DULOXETINE 60 MG CAP, DELAYED RELEASE
60 mg | ORAL_CAPSULE | ORAL | 2 refills | Status: DC
Start: 2020-09-30 — End: 2021-01-17

## 2020-10-03 ENCOUNTER — Encounter

## 2020-10-08 ENCOUNTER — Encounter

## 2020-10-08 MED ORDER — DIAZEPAM 5 MG TAB
5 mg | ORAL_TABLET | ORAL | 2 refills | Status: DC
Start: 2020-10-08 — End: 2021-01-20

## 2020-10-08 NOTE — Telephone Encounter (Signed)
Previous message addressing diazepam has been sent to provider.

## 2020-10-08 NOTE — Telephone Encounter (Signed)
-----   Message from Marrianne Mood sent at 10/08/2020  8:20 AM EDT -----  Subject: Refill Request    QUESTIONS  Name of Medication? diazePAM (VALIUM) 5 mg tablet  Patient-reported dosage and instructions? 5 mg tablet/twice daily  How many days do you have left? 0  Preferred Pharmacy? [facility] [phone]  Pharmacy phone number (if available)? [phone]  ---------------------------------------------------------------------------  --------------  Cleotis Lema INFO  What is the best way for the office to contact you? OK to leave message on   voicemail  Preferred Call Back Phone Number? [phone]  ---------------------------------------------------------------------------  --------------  SCRIPT ANSWERS  Relationship to Patient? Self

## 2020-10-08 NOTE — Telephone Encounter (Signed)
-----   Message from Marrianne Mood sent at 10/08/2020  8:18 AM EDT -----  Subject: Message to Provider    QUESTIONS  Information for Provider? Patient is feeling depressed a lot the last 3   weeks and is wondering if there is something she can put on for it. She   states she is stressed at work as well. She is out of diazepam. Please   advise. Thanks   ---------------------------------------------------------------------------  --------------  Cleotis Lema INFO  What is the best way for the office to contact you? OK to leave message on   voicemail  Preferred Call Back Phone Number? [phone]  ---------------------------------------------------------------------------  --------------  SCRIPT ANSWERS  Relationship to Patient? Self

## 2020-10-08 NOTE — Telephone Encounter (Signed)
Forwarding to provider to decide if pt will need a visit or adjustments can be made w/o.

## 2020-10-09 ENCOUNTER — Encounter: Payer: MEDICARE | Attending: Internal Medicine | Primary: Internal Medicine

## 2020-10-16 ENCOUNTER — Encounter: Payer: MEDICARE | Attending: Specialist | Primary: Internal Medicine

## 2020-11-03 ENCOUNTER — Encounter

## 2020-11-05 ENCOUNTER — Encounter

## 2020-11-11 ENCOUNTER — Encounter: Payer: MEDICARE | Attending: Internal Medicine | Primary: Internal Medicine

## 2020-11-12 NOTE — Telephone Encounter (Signed)
Lindsay [name] lmom to cancel surgery again. I lmom of hers to call when she is ready

## 2020-11-13 ENCOUNTER — Encounter: Payer: MEDICARE | Attending: Specialist | Primary: Internal Medicine

## 2020-11-13 MED ORDER — ROPINIROLE 1 MG TAB
1 mg | ORAL_TABLET | ORAL | 0 refills | Status: DC
Start: 2020-11-13 — End: 2021-03-18

## 2020-11-19 ENCOUNTER — Encounter: Primary: Internal Medicine

## 2020-12-04 ENCOUNTER — Encounter: Payer: MEDICARE | Attending: Internal Medicine | Primary: Internal Medicine

## 2020-12-11 ENCOUNTER — Encounter: Payer: MEDICARE | Attending: Specialist | Primary: Internal Medicine

## 2020-12-18 ENCOUNTER — Inpatient Hospital Stay: Payer: MEDICARE | Attending: Obstetrics & Gynecology | Primary: Internal Medicine

## 2020-12-18 ENCOUNTER — Ambulatory Visit: Payer: MEDICARE | Primary: Internal Medicine

## 2020-12-29 ENCOUNTER — Encounter: Attending: Internal Medicine | Primary: Internal Medicine

## 2021-01-08 ENCOUNTER — Encounter: Attending: Neurology | Primary: Internal Medicine

## 2021-01-10 ENCOUNTER — Emergency Department: Admit: 2021-01-10 | Payer: MEDICARE | Primary: Internal Medicine

## 2021-01-10 ENCOUNTER — Inpatient Hospital Stay
Admit: 2021-01-10 | Discharge: 2021-01-10 | Disposition: A | Discharge status: Home / Self Care | Payer: MEDICARE | Attending: Emergency Medicine

## 2021-01-10 DIAGNOSIS — S0990XA Unspecified injury of head, initial encounter: Secondary | ICD-10-CM

## 2021-01-10 NOTE — ED Notes (Signed)
Assumed care of pt from triage. Pt c/o mechanical GLF around 0500 this morning. Pt reports having trouble getting off floor at that time. Pt reports putting husband on hospice care and witnessing him injure himself during a fall recently. Pt reports feeling unsteady on ambulation. Denies dizziness, lightheadedness, chest pain, SOB. Reports headache and photophobia since fall. Placed pt on monitor x3. VSS. Call button within reach. Will continue to monitor.

## 2021-01-10 NOTE — ED Notes (Signed)
Reviewed discharge instructions with pt. Pt verbalizes understanding. Opportunity for questions provided. Pt to exit via wheelchair.

## 2021-01-10 NOTE — ED Notes (Signed)
Pt ambulated without weakness, but some unsteadiness.

## 2021-01-10 NOTE — ED Provider Notes (Signed)
ED Provider Notes by Jacklynn Bue, MD at 01/10/21 1708                Author: Jacklynn Bue, MD  Service: Joesphine Bare Type: Physician       Filed: 01/10/21 1927  Date of Service: 01/10/21 1708  Status: Signed          Editor: Jacklynn Bue, MD (Physician)               EMERGENCY DEPARTMENT HISTORY AND PHYSICAL EXAM           Date: 01/10/2021   Patient Name: Lindsay [name]        History of Presenting Illness          Chief Complaint       Patient presents with        ?  Fall             Pt ambulatory into triage with a cc of neck pain and head pain and double vision secondary to a fall this am; pt states she hit her head when falling;  she tripped and fell while getting to her father; pt was here at hospital visiting her father when this fall happened and she refused being seen in ED att              HPI: Lindsay [name],  65 y.o. female to ED after a fall.  Patient was taking care of her dad, who was admitted  to the hospital.  He was trying to get out of bed and walk and she attempted to prevent him from doing so.  Consequently she lost her balance and fell to the ground.  She hit her head on the ground but did not pass out.  She noticed pain to her right  shoulder and the right side of her chest as well.  She was encouraged to come to the ED but she stated she needed to continue to take care of her dad so she waited a couple hours.  Now, she was here for evaluation. She describes diplopia without any change  in visual acuity or other ocular symptoms. She states she has felt off balance when attempting to ambulate since her fall.               There are no other complaints, changes, or physical findings at this time.      PCP: Clarene Critchley, MD        No current facility-administered medications on file prior to encounter.          Current Outpatient Medications on File Prior to Encounter          Medication  Sig  Dispense  Refill           ?  rOPINIRole (REQUIP) 1 mg tablet  TAKE 1 TABLET  AT 1-2PM AND TAKE 3 TABLETS BY MOUTH 1 TO 3 HOURS BEFORE BEDTIME EVERYDAY  360 Tablet  0           ?  diazePAM (VALIUM) 5 mg tablet  TAKE 1 TABLET BY MOUTH IN THE MORNING & 2 TABLET IN THE EVENING DAILY  90 Tablet  2           ?  DULoxetine (CYMBALTA) 60 mg capsule  TAKE 1 CAPSULE BY MOUTH EVERY DAY  30 Capsule  2     ?  gabapentin enacarbil (Horizant) 600 mg TbER  Take 600 mg  by mouth two (2) times a day. Max Daily Amount: 1,200 mg.  180 Tablet  1     ?  Vitamin D3 125 mcg (5,000 unit) tab tablet  TAKE 1 TABLET BY MOUTH DAILY         ?  omeprazole (PRILOSEC OTC) 20 mg tablet  Take 20 mg by mouth.         ?  lidocaine (Lidocaine Pain Relief) 4 % patch  1 Patch by TransDERmal route every twelve (12) hours every twelve (12) hours.  10 Patch  0     ?  methocarbamoL (Robaxin-750) 750 mg tablet  Take 1 Tablet by mouth four (4) times daily as needed for Muscle Spasm(s).  20 Tablet  0     ?  atorvastatin (LIPITOR) 10 mg tablet  Take 1 Tab by mouth daily.  30 Tab  3     ?  buPROPion XL (WELLBUTRIN XL) 150 mg tablet  150 mg two (2) times a day. Take 1 tab BID    0           ?  aspirin 81 mg chewable tablet  Take 1 Tab by mouth daily.  30 Tab  0             Past History        Past Medical History:     Past Medical History:        Diagnosis  Date         ?  Abnormal brain scan       ?  Anxiety disorder       ?  Arthritis       ?  Chronic pain       ?  Depression       ?  DVT (deep venous thrombosis) (HCC)       ?  Falls frequently       ?  Fracture of left ankle       ?  IWOEHOZY(248.2)  2011     ?  Ill-defined condition  09/15/2015          FX R HAND         ?  Neurocutaneous syndrome (HCC)       ?  Other and unspecified hyperlipidemia  12/27/2012     ?  PE (pulmonary embolism)  12/2012     ?  RLS (restless legs syndrome)  03/16/2016          Dr Scarlett Presto         ?  TIA (transient ischemic attack)  03/2016          Right facial droop, numbness in lip         ?  Torn rotator cuff            right           Past Surgical  History:     Past Surgical History:         Procedure  Laterality  Date          ?  HX APPENDECTOMY         ?  HX CESAREAN SECTION              x4          ?  HX GI              COLONOSCOPY          ?  HX KNEE  REPLACEMENT  Left  10/29/2015     ?  HX ORTHOPAEDIC    08/2004          rotater cuff r. shoulder          ?  HX ORTHOPAEDIC              left ankle fracture treated with air cast          ?  HX ORTHOPAEDIC    6/13          left shoulder fxt and dislocation          ?  HX OTHER SURGICAL    2010          left foot torn tendon          ?  HX TUBAL LIGATION         ?  IMPLANT BREAST SILICONE/EQ    2002          Bilateral breast implants          ?  IR KYPHOPLASTY THORACIC    12/12/2019     ?  PR BREAST SURGERY PROCEDURE UNLISTED    2002          AUGMENTATION           Family History:     Family History         Problem  Relation  Age of Onset          ?  Heart Disease  Mother       ?  Heart Disease  Father       ?  Cancer  Father                thyroid cancer          ?  Arthritis-rheumatoid  Sister       ?  No Known Problems  Son       ?  No Known Problems  Daughter       ?  No Known Problems  Daughter       ?  No Known Problems  Daughter            ?  Anesth Problems  Neg Hx             Social History:     Social History          Tobacco Use         ?  Smoking status:  Never Smoker     ?  Smokeless tobacco:  Never Used       Vaping Use         ?  Vaping Use:  Never used       Substance Use Topics         ?  Alcohol use:  No             Comment: Pt denies         ?  Drug use:  No           Allergies:   No Known Allergies           Review of Systems     Review of Systems    Constitutional: Negative for chills and fever.    HENT: Negative for rhinorrhea.     Eyes: Positive for visual disturbance. Negative for redness.    Respiratory: Negative for shortness of breath.     Cardiovascular: Negative for chest pain.  Gastrointestinal: Negative for abdominal pain.    Genitourinary: Negative for dysuria.     Musculoskeletal: Positive for arthralgias. Negative for back pain.    Neurological: Positive for headaches. Negative for syncope.    Psychiatric/Behavioral: The patient is not nervous/anxious.     All other systems reviewed and are negative.           Physical Exam     Physical Exam   Vitals and nursing note reviewed.   Constitutional:        Appearance: Normal appearance.   HENT :       Head: Normocephalic and atraumatic.      Mouth/Throat:      Mouth: Mucous membranes are moist.    Eyes:       Extraocular Movements: Extraocular movements intact.      Pupils: Pupils are equal, round, and reactive to light.      Comments: Eyes normal in appearance and function     Cardiovascular:       Rate and Rhythm: Normal rate and regular rhythm.      Pulses: Normal pulses.    Pulmonary:       Effort: Pulmonary effort is normal.      Breath sounds: Normal breath sounds.   Abdominal :      Palpations: Abdomen is soft.      Tenderness: There is no abdominal tenderness.     Musculoskeletal:          General: No deformity.      Cervical back: Neck supple. No tenderness (no c spine TTP).      Comments:  TTP to R shoulder w ROM limited by pain. TTP overlying R ant chest. No other tenderness to bony prominences.    Skin:      General: Skin is warm and dry.   Neurological :       General: No focal deficit present.      Mental Status: She is alert and oriented to person, place, and time.      Sensory: No sensory deficit.      Motor: No weakness.   Psychiatric:         Mood and Affect: Mood normal.         Behavior: Behavior  normal.               Diagnostic Study Results        Labs -    No results found for this or any previous visit (from the past 24 hour(s)).      Radiologic Studies -      XR SHOULDER RT AP/LAT MIN 2 V       Final Result     No acute process.                 XR CHEST PORT       Final Result          No acute findings.                 CT HEAD WO CONT       Final Result     No acute abnormality                            CT Results   (Last 48 hours)  01/10/21 1733    CT HEAD WO CONT  Final result            Impression:    No acute abnormality                                     Narrative:    EXAM: CT HEAD WO CONT             INDICATION: trauma/R side             COMPARISON: CT head 08/29/2017, CT maxillofacial 10/30/2017.             CONTRAST: None.             TECHNIQUE: Unenhanced CT of the head was performed using 5 mm images. Brain and      bone windows were generated. Coronal and sagittal reformats. CT dose reduction      was achieved through use of a standardized protocol tailored for this      examination and automatic exposure control for dose modulation.               FINDINGS:      Generalized volume loss. Scattered white matter hypodensities may reflect      chronic microangiopathic change. There is no intracranial hemorrhage,      extra-axial collection, or mass effect. The basilar cisterns are open. No CT      evidence of acute infarct.             The bone windows demonstrate no abnormalities. The visualized portions of the      paranasal sinuses and mastoid air cells are clear.                                 CXR Results   (Last 48 hours)                                    01/10/21 1749    XR CHEST PORT  Final result            Impression:           No acute findings.                              Narrative:    EXAM:  XR CHEST PORT             INDICATION: trauma, pain to ant chest R worse than L             COMPARISON: Chest radiograph 06/18/2017             TECHNIQUE: Upright portable chest AP view             FINDINGS:              Cardiac silhouette within normal limits. Aorta is mildly tortuous. Lungs and      pleural spaces grossly clear. Kyphoplasty material at T12.                                       Medical Decision Making     I am the  first provider for this patient.      I reviewed the vital signs, available nursing notes, past medical history, past surgical history, family  history and social history.      Vital Signs-Reviewed the patient's vital signs.   Patient Vitals for the past 24 hrs:            Temp  Pulse  Resp  BP  SpO2            01/10/21 1644  99.1 ??F (37.3 ??C)  91  16  (!) 98/59  100 %              Provider Notes (Medical Decision Making):    Very pleasant 65 year old presenting to ED after fall while she was in the hospital.  She hit her head but did not lose consciousness.  However she has had some difficulty with ambulating since then  and continues to have some pain to her right shoulder and right chest wall.  X-rays of points of bony prominences were done, demonstrate no acute abnormalities.  CT head was done and demonstrates no intracranial abnormalities.  Patient was able to ambulate  with no significant difficulty.  Suspect difficulty with ambulation may be mild concussion versus dehydration versus poor sleep over the last few days.  Patient would like to go home and this seems reasonable.  She will be encouraged to return with any  worsening of condition or new concerning symptoms and to follow-up with her physician for further evaluation in 1 to 2 days.      EKG sinus, rate 77, nonspecific ST T changes, normal QT      ED Course:       Initial assessment performed. The patients presenting problems have been discussed, and they are in agreement with the care plan formulated and outlined with them.  I have encouraged them to ask questions as they arise throughout their visit.            Disposition:   dc      PLAN:   1.      Discharge Medication List as of 01/10/2021  6:56 PM               2.      Follow-up Information      None             Return to ED if worse         Diagnosis        Clinical Impression: fall, head injury, shoulder pain

## 2021-01-11 LAB — EKG 12-LEAD
Atrial Rate: 77 {beats}/min
Diagnosis: NORMAL
P Axis: 60 degrees
P-R Interval: 142 ms
Q-T Interval: 394 ms
QRS Duration: 80 ms
QTc Calculation (Bazett): 445 ms
R Axis: 65 degrees
T Axis: 62 degrees
Ventricular Rate: 77 {beats}/min

## 2021-01-11 LAB — EKG, 12 LEAD, INITIAL
Atrial Rate: 77 {beats}/min
Calculated P Axis: 60 degrees
Calculated R Axis: 65 degrees
Calculated T Axis: 62 degrees
Diagnosis: NORMAL
P-R Interval: 142 ms
Q-T Interval: 394 ms
QRS Duration: 80 ms
QTC Calculation (Bezet): 445 ms
Ventricular Rate: 77 {beats}/min

## 2021-01-15 ENCOUNTER — Encounter: Payer: MEDICARE | Attending: Specialist | Primary: Internal Medicine

## 2021-01-17 ENCOUNTER — Encounter

## 2021-01-17 MED ORDER — DULOXETINE 60 MG CAP, DELAYED RELEASE
60 mg | ORAL_CAPSULE | ORAL | 1 refills | Status: DC
Start: 2021-01-17 — End: 2021-03-18

## 2021-01-17 NOTE — Telephone Encounter (Signed)
appt is due thanks

## 2021-01-20 ENCOUNTER — Encounter

## 2021-01-20 MED ORDER — DIAZEPAM 5 MG TAB
5 mg | ORAL_TABLET | ORAL | 0 refills | Status: DC
Start: 2021-01-20 — End: 2021-03-04

## 2021-02-02 ENCOUNTER — Encounter: Payer: MEDICARE | Attending: Nurse Practitioner | Primary: Internal Medicine

## 2021-02-03 ENCOUNTER — Encounter: Attending: Internal Medicine | Primary: Internal Medicine

## 2021-02-04 NOTE — Telephone Encounter (Signed)
[facility]  [facility], then cramps at night, throw up and diarrhea got IV fluids on the July 18th(Back story).  Covid positive just returned to work and feels like she is very foggy and fatigued. Patient states she is not sure what she should do with work or maybe more IV fluids. Patient seems very confused about her Direction.

## 2021-02-05 ENCOUNTER — Telehealth: Admit: 2021-02-05 | Payer: MEDICARE | Attending: Internal Medicine | Primary: Internal Medicine

## 2021-02-05 ENCOUNTER — Telehealth: Attending: Internal Medicine | Primary: Internal Medicine

## 2021-02-05 DIAGNOSIS — U071 COVID-19: Secondary | ICD-10-CM

## 2021-02-05 NOTE — Progress Notes (Signed)
Lindsay [name] (DOB: [birth year]) is a 65 y.o. female, established patient, here for evaluation of the following chief complaint(s):   Fatigue (Patient states she made hot cocoa the night of July 17 and since then has felt bad. Monday July 18 she stayed in bed all day with diarrhea. Tuesday July 19  went to [facility] and got IV fluids, tested negative for Covid. Friday July 22 tested positive and hasn't felt well, stayed in bed. Patient  feels she has absolutely no energy at all and feels awful. )       ASSESSMENT/PLAN:  Below is the assessment and plan developed based on review of pertinent history, labs, studies, and medications.    1. COVID-19-suspect fatigue from the virus  No sxs of infection currently but check cxr to be sure given some cough  Discussed hydration with electrolyte repletion and water  Consider pt first eval if cont to feel poorly  Advised appt here iin my office next week  -     XR CHEST PA LAT; Future  2. Recurrent depression (HCC)  3. Fatigue, unspecified type      No follow-ups on file.    SUBJECTIVE/OBJECTIVE:  HPI  Tacoya is seen with fatigue.  She notes July 18th she had diarrhea, July 19th went to [facility], tested negative for COVID.  Did get some IV fluids.  July 22nd she tested positive for COVID.  Since then the diarrhea has resolved.  She has not had fevers, chills or significant cough, but her appetite is decreased and energy is not good.  Her father in the interim passed away.  She went earlier this week to try to help her sister with cleaning out his house, also went to the [employer] and just felt exhausted.  Currently denies UTI symptoms.  Denies bleeding, denies diarrhea.  Wonders if she could be dehydrated again.  Does have some cough.        Review of Systems     Patient-Reported Weight: 134lb       Physical Exam    [INSTRUCTIONS:  "[x] " Indicates a positive item  "[] " Indicates a negative item  -- DELETE ALL ITEMS NOT EXAMINED]    Constitutional: [x]  Appears well-developed  and well-nourished [x]  No apparent distress      []  Abnormal -     Mental status: [x]  Alert and awake  [x]  Oriented to person/place/time [x]  Able to follow commands    []  Abnormal -     Eyes:   EOM    [x]   Normal    []  Abnormal -   Sclera  [x]   Normal    []  Abnormal -          Discharge [x]   None visible   []  Abnormal -     HENT: [x]  Normocephalic, atraumatic  []  Abnormal -   [x]  Mouth/Throat: Mucous membranes are moist    External Ears [x]  Normal  []  Abnormal -    Neck: [x]  No visualized mass []  Abnormal -     Pulmonary/Chest: [x]  Respiratory effort normal   [x]  No visualized signs of difficulty breathing or respiratory distress        []  Abnormal -      Musculoskeletal:   [x]  Normal gait with no signs of ataxia         [x]  Normal range of motion of neck        []  Abnormal -     Neurological:        [  x] No Facial Asymmetry (Cranial nerve 7 motor function) (limited exam due to video visit)          [x]  No gaze palsy        []  Abnormal -          Skin:        [x]  No significant exanthematous lesions or discoloration noted on facial skin         []  Abnormal -            Psychiatric:       [x]  Normal Affect []  Abnormal -        [x]  No Hallucinations    Other pertinent observable physical exam findings:-  Looks well      , was evaluated through a synchronous (real-time) audio-video encounter. The patient (or guardian if applicable) is aware that this is a billable service, which includes applicable co-pays. This Virtual Visit was conducted with patient's (and/or legal guardian's) consent. The visit was conducted pursuant to the emergency declaration under the and the , 1135 waiver authority and the and Act.  Patient identification was verified, and a caregiver was present when appropriate.  The patient was located at: Home: [address]  [place] Lindsay [name]  The provider was located at: Facility (Appt  Department): [address] [place]  [place] [employer]       An electronic signature was used to authenticate this note.  -- [place], MD

## 2021-02-12 NOTE — Telephone Encounter (Signed)
Called to follow up on phone message. Informed patient that Dr Darl Pikes is out of office this week. Informed patient that she should continue to RICE her wrist and that it will take time for it to heal. Patient is going to be following up with ortho next week. Patient understood and was thankful for the call.

## 2021-02-12 NOTE — Telephone Encounter (Signed)
-----   Message from Claris Gower sent at 02/12/2021 11:34 AM EDT -----  Subject: Message to Provider    QUESTIONS  Information for Provider? Patient fell on the weekend and went to clinic   for her thumb and was diagnosed with a sprain wrist. Her thumb still hurts   and was diagnosed with covid July 22. Wanted to know is doctor Darl Pikes wants   to see her. Her thumb still hurts  ---------------------------------------------------------------------------  --------------  Cleotis Lema INFO  [phone]; OK to leave message on voicemail  ---------------------------------------------------------------------------  --------------  SCRIPT ANSWERS  Relationship to Patient? Self

## 2021-03-03 ENCOUNTER — Encounter

## 2021-03-04 MED ORDER — DIAZEPAM 5 MG TAB
5 mg | ORAL_TABLET | ORAL | 0 refills | Status: DC
Start: 2021-03-04 — End: 2021-04-10

## 2021-03-07 ENCOUNTER — Encounter

## 2021-03-12 ENCOUNTER — Encounter: Admit: 2021-03-12 | Payer: MEDICARE | Primary: Internal Medicine

## 2021-03-12 ENCOUNTER — Encounter: Admit: 2021-03-12 | Payer: MEDICARE | Attending: Specialist | Primary: Internal Medicine

## 2021-03-12 ENCOUNTER — Ambulatory Visit: Attending: Specialist | Primary: Internal Medicine

## 2021-03-12 DIAGNOSIS — G8929 Other chronic pain: Secondary | ICD-10-CM

## 2021-03-12 DIAGNOSIS — M545 Low back pain, unspecified: Secondary | ICD-10-CM

## 2021-03-12 MED ORDER — HORIZANT ER 600 MG TABLET,EXTENDED RELEASE
600 mg | ORAL_TABLET | ORAL | 1 refills | Status: AC
Start: 2021-03-12 — End: ?

## 2021-03-12 NOTE — Telephone Encounter (Signed)
Lindsay [name] - Please fax printed Rx to pt's pharmacy.

## 2021-03-12 NOTE — Telephone Encounter (Signed)
Faxed printed Rx to [facility] pharmacy at [phone]. Received a successful confirmation back.

## 2021-03-12 NOTE — Progress Notes (Signed)
Done in office today.

## 2021-03-12 NOTE — Progress Notes (Signed)
Lindsay BillingsKathy A [name] (DOB: 03/30/1956) is a 65 y.o. female patient here for evaluation of the following chief complaint(s):  LOW BACK PAIN         ASSESSMENT/PLAN:  Below is the assessment and plan developed based on review of pertinent history, physical exam, labs, studies, and medications.    1. Chronic bilateral low back pain without sciatica  -     XR SPINE LUMB MIN 4 V; Future  2. Chronic midline low back pain without sciatica  3. 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  The procedure again which would be a T10-L2 fusion with cement augmentation.  She would also have a laminectomy at T11-T12 and T12-L1 with osteotomies for correction of the kyphotic deformity.  Risk and benefits were discussed with her again.  She will contact us if she would like to proceed.    Procedure: Lumbar laminectomy T10-L2 with ponte osteotomies and T10-L2 fusion.      The risks and benefits were discussed at length with the patient and the patient has elected to proceed.  Indications for surgery include failed conservative treatment.  Alternative treatments, risks and the perioperative course were discussed with the patient.  All questions were answered.  The risks and benefits of the procedure were explained.  Benefits include definitive diagnosis, relief of pain, elimination of deformity and improved function.  Risks of surgery including bleeding, infection, weakness, numbness, CSF leak, failure to improve symptoms, exacerbation of medical co-morbidities and even death were discussed with the patient.             No follow-ups on file.      SUBJECTIVE/OBJECTIVE:  Lindsay BillingsKathy A [name] (DOB: 06/21/1956) is a 65 y.o. female who presents today for the following:  Chief  Complaint   Patient presents with    LOW BACK PAIN        She was scheduled for a posterior thoracolumbar fusion for a T12 compression fracture with instability due to vertebral plana.  She postponed this last year.  She comes in today for an evaluation for continued persistent back pain.  It is unchanged.  She has no new symptoms other than the severe back pain with any prolonged standing or walking.  She denies any bowel bladder difficulties.  She is here to discuss possible surgical intervention again.  Denies any new trauma.     Lindsay [name] returns today for a follow-up visit.  She was last seen approximately 3 months ago and was recommended for a T10-L2 posterior spinal fusion secondary to chronic T12 compression fracture with nonunion.  She states that her symptoms began after falling off of a stool in June, 2021.  She had a subsequent kyphoplasty that did not substantially relieve her symptoms.  It was recommended that given her persistent pain, combined with kyphosis throughout the T11 and T12 vertebra that she was a good candidate for stabilization of her fracture.  She has continued mid back pain with also low back pain.  She has pain radiating into the right lower abdomen.  No leg pain.  No numbness, tingling, or weakness in the legs.  No bowel or bladder changes.      IMAGING:  XR Results (most recent):  MRI Results (most recent):  Results from Hospital Encounter encounter on 12/11/19    MRI [facility] SPINE WO CONT    Narrative  EXAM: MRI [facility] SPINE WO CONT, MRI LUMB SPINE WO CONT    INDICATION: t12 compression fx on 6/3, r/o cord compression.    COMPARISON: Lumbar spine CT dated 12/06/2019    TECHNIQUE: MR imaging of the thoracic and lumbar spine was performed using the  following sequences: sagittal T1, T2, stir; axial T1, T2. Additional coronal T2  images were obtained through the lumbar spine    CONTRAST: None.    FINDINGS:    Comparison prior study, there is increased loss of height of the T12  vertebral  body with increased bony retropulsion. Superior endplate compression fracture of  L4 is also noted which is likely chronic. There is moderate severe canal  stenosis at T12 with moderate bilateral foraminal narrowing. Possible mild  compression on the spinal cord. No definite abnormal signal is identified in the  conus.    The remaining thoracic spine levels are unremarkable.    T12-L1 demonstrates minimal disc bulge without canal stenosis or foraminal  narrowing.    L1-2 demonstrates no evidence of canal stenosis or foraminal narrowing.    At L2-3, there is mild facet hypertrophy without canal stenosis or foraminal  narrowing.    At L3-4, there is a mild concentric disc bulge and facet hypertrophy with  minimal canal stenosis and minimal bilateral foraminal narrowing.    L4-5 there is severe facet hypertrophy and ligament flavum hypertrophy as well  with a mild disc bulge causing mild canal stenosis. No significant foraminal  narrowing.    L5-S1, there is concentric bulge with osteophyte formation and a probable small  central disc extrusion with cranial migration. Overall canal stenosis is  minimal. The neural foramen are patent.    Incidental soft tissue imaging is unremarkable    Impression  1.  Increase loss of height at T12 with increased bony retropulsion and moderate  severe canal stenosis. Possible compression on the cord although no abnormal  cord signal is identified.    2.  Other degenerative changes in the lumbar spine as described.    3.  Mild superior endplate compression deformity of L4 with a chronic       No Known Allergies    Current Outpatient Medications   Medication Sig    diazePAM (VALIUM) 5 mg tablet TAKE 1 TABLET BY MOUTH IN THE MORNING & 2 TABLET IN THE EVENING DAILY    rOPINIRole (REQUIP) 1 mg tablet TAKE 1 TABLET AT 1-2PM AND TAKE 3 TABLETS BY MOUTH 1 TO 3 HOURS BEFORE BEDTIME EVERYDAY    Vitamin D3 125 mcg (5,000 unit) tab tablet TAKE 1 TABLET BY MOUTH DAILY    omeprazole  (PRILOSEC OTC) 20 mg tablet Take 20 mg by mouth.    atorvastatin (LIPITOR) 10 mg tablet Take 1 Tab by mouth daily.    buPROPion XL (WELLBUTRIN XL) 150 mg tablet Take 150 mg in the morning. Take 1 tab BID    aspirin 81 mg chewable tablet Take 1 Tab by mouth daily.    gabapentin enacarbil (Horizant) 600 mg TbER TAKE 1 TABLET BY MOUTH TWICE A DAY    DULoxetine (CYMBALTA) 60 mg capsule TAKE 1 CAPSULE BY MOUTH EVERY DAY (Patient not taking: Reported on 03/12/2021)    lidocaine (Lidocaine Pain Relief) 4 % patch 1 Patch by TransDERmal route every twelve (12) hours every twelve (12) hours. (Patient not  taking: Reported on 03/12/2021)    methocarbamoL (Robaxin-750) 750 mg tablet Take 1 Tablet by mouth four (4) times daily as needed for Muscle Spasm(s). (Patient not taking: Reported on 03/12/2021)     No current facility-administered medications for this visit.       Past Medical History:   Diagnosis Date    Abnormal brain scan     Anxiety disorder     Arthritis     Chronic pain     Depression     DVT (deep venous thrombosis) (HCC)     Falls frequently     Fracture of left ankle     Headache(784.0) 2011    Ill-defined condition 09/15/2015    FX R HAND    Neurocutaneous syndrome (HCC)     Other and unspecified hyperlipidemia 12/27/2012    PE (pulmonary embolism) 12/2012    RLS (restless legs syndrome) 03/16/2016    Dr Scarlett Presto    TIA (transient ischemic attack) 03/2016    Right facial droop, numbness in lip    Torn rotator cuff     right        Past Surgical History:   Procedure Laterality Date    HX APPENDECTOMY      HX CESAREAN SECTION      x4    HX GI      COLONOSCOPY    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 shoulder    HX ORTHOPAEDIC      left ankle fracture treated with air cast    HX ORTHOPAEDIC  6/13    left shoulder fxt and dislocation    HX OTHER SURGICAL  2010    left foot torn tendon    HX TUBAL LIGATION      IMPLANT BREAST SILICONE/EQ  2002    Bilateral breast implants    IR  KYPHOPLASTY THORACIC  12/12/2019    PR BREAST SURGERY PROCEDURE UNLISTED  2002    AUGMENTATION       Family History   Problem Relation Age of Onset    Heart Disease Mother     Heart Disease Father     Cancer Father         thyroid cancer    Arthritis-rheumatoid Sister     No Known Problems Son     No Known Problems Daughter     No Known Problems Daughter     No Known Problems Daughter     Anesth Problems Neg Hx         Social History     Tobacco Use    Smoking status: Never    Smokeless tobacco: Never   Substance Use Topics    Alcohol use: No     Comment: Pt denies        Review of Systems   Constitutional: Negative.    Respiratory: Negative.     Cardiovascular: Negative.    Gastrointestinal: Negative.    Endocrine: Negative.    Genitourinary: Negative.    Musculoskeletal:  Positive for back pain.   Skin: Negative.    Allergic/Immunologic: Negative.    Hematological: Negative.    Psychiatric/Behavioral: Negative.     All other systems reviewed and are negative.       Pain Assessment  03/12/2021   Location of Pain Back   Location Modifiers Posterior   Severity of Pain 8           Vitals:  Ht 5\' 3"  (1.6 m)    Wt 135 lb (61.2 kg)    BMI 23.91 kg/m??    Body mass index is 23.91 kg/m??.    Physical Exam    Neurologic  Sensory  Light Touch - Intact - Globally.  Overall Assessment of Muscle Strength and Tone reveals  Lower Extremities - Right Iliopsoas - 5/5. Left Iliopsoas - 5/5. Right Tibialis Anterior - 5/5. Left Tibialis Anterior - 5/5. Right Gastroc-Soleus - 5/5. Left Gastroc-Soleus - 5/5. Right EHL - 5/5. Left EHL - 5/5.  General Assessment of Reflexes  Right Ankle - Clonus is not present. Left Ankle - Clonus is not present.  Reflexes (Dermatomes)  2/2 Normal - Left Achilles (L5-S2), Left Knee (L2-4), Right Achilles (L5-S2) and Right Knee (L2-4).    Musculoskeletal  Global Assessment  Examination of related systems reveals - well-developed, well-nourished, in no acute distress, alert and oriented x 3. Gait and Station -  normal gait and station and normal posture. Right Lower Extremity - normal strength and tone, normal range of motion without pain and no instability, subluxation or laxity. Left Lower Extremity - normal strength and tone, normal range of motion without pain and no instability, subluxation or laxity.  Spine/Ribs/Pelvis--moderate kyphosis noted at the thoracolumbar junction  Cervical Spine - Examination of the cervical spine reveals - no tenderness to palpation, no pain, no swelling, edema or erythema, normal cervical spine movements and normal sensation. Thoracic (Dorsal) Spine - Examination of the thoracic spine reveals - no tenderness over thoracic vertebrae, no pain, normal sensation and normal thoracic spine movements. Lumbosacral Spine - Examination of the lumbosacral spine reveals - no known fractures or deformities. Inspection and Palpation - Tenderness - moderate. Assessment of pain reveals the following findings - The pain is characterized as - moderate. Location - pain refers to lower back bilaterally. ROJM - Trunk Extension - 15 degrees. Lumbar Spine Flexion - 35 ??. Lumbosacral Spine - Functional Testing - Babinski Test negative, Prone Knee Bending Test negative, Slump Test negative, Straight Leg Raising Test negative.      An electronic signature was used to authenticate this note.  -- , MD

## 2021-03-13 NOTE — Progress Notes (Signed)
Faxed over medication script (Horizant) to [facility] at [phone]. Received a success fax back.

## 2021-03-18 ENCOUNTER — Inpatient Hospital Stay: Admit: 2021-03-18 | Payer: MEDICARE | Attending: Obstetrics & Gynecology | Primary: Internal Medicine

## 2021-03-18 ENCOUNTER — Encounter

## 2021-03-18 DIAGNOSIS — Z1231 Encounter for screening mammogram for malignant neoplasm of breast: Secondary | ICD-10-CM

## 2021-03-18 DIAGNOSIS — N958 Other specified menopausal and perimenopausal disorders: Secondary | ICD-10-CM

## 2021-03-18 MED ORDER — ROPINIROLE 1 MG TAB
1 mg | ORAL_TABLET | ORAL | 0 refills | Status: DC
Start: 2021-03-18 — End: 2021-06-18

## 2021-03-18 MED ORDER — DULOXETINE 60 MG CAP, DELAYED RELEASE
60 mg | ORAL_CAPSULE | ORAL | 1 refills | Status: DC
Start: 2021-03-18 — End: 2021-04-10

## 2021-04-01 ENCOUNTER — Telehealth

## 2021-04-01 NOTE — Telephone Encounter (Signed)
Pt is calling wanted to know if the doctor could refill her Lexapro. PSR could not find medication in chart. Please advise

## 2021-04-01 NOTE — Telephone Encounter (Signed)
Per MD patient needs appt to discuss meds. Please notify she has been scheduled for a virtual appt on Friday Oct 7th at 10:00. Appt is required per MD. Please notify.

## 2021-04-10 ENCOUNTER — Telehealth: Admit: 2021-04-10 | Payer: MEDICARE | Attending: Internal Medicine | Primary: Internal Medicine

## 2021-04-10 ENCOUNTER — Encounter

## 2021-04-10 ENCOUNTER — Telehealth: Attending: Internal Medicine | Primary: Internal Medicine

## 2021-04-10 DIAGNOSIS — F339 Major depressive disorder, recurrent, unspecified: Secondary | ICD-10-CM

## 2021-04-10 MED ORDER — DIAZEPAM 5 MG TAB
5 mg | ORAL_TABLET | ORAL | 0 refills | Status: DC
Start: 2021-04-10 — End: 2021-06-11

## 2021-04-10 MED ORDER — BUPROPION XL 150 MG 24 HR TAB
150 mg | ORAL_TABLET | Freq: Every day | ORAL | 1 refills | Status: DC
Start: 2021-04-10 — End: 2021-05-25

## 2021-04-10 MED ORDER — ESCITALOPRAM 10 MG TAB
10 mg | ORAL_TABLET | Freq: Every day | ORAL | 1 refills | Status: DC
Start: 2021-04-10 — End: 2021-05-25

## 2021-04-10 NOTE — Progress Notes (Signed)
Progress Notes by Clarene Critchley, MD at 04/10/21 1000                Author: Clarene Critchley, MD  Service: --  Author Type: Physician       Filed: 04/10/21 1215  Encounter Date: 04/10/2021  Status: Signed          Editor: Clarene Critchley, MD (Physician)               Lindsay [name] (DOB: 1955/08/13) is a 65 y.o. female, established patient, here for evaluation of the following chief complaint(s):    Medication Evaluation and Depression          ASSESSMENT/PLAN:   Below is the assessment and plan developed based on review of pertinent history, labs, studies, and medications.      1. Recurrent depression (HCC)-I am very concerned about her stopping meds completely and discussed verbal contract that she will not do this moving forward and will see me in office in 1 month. Resume  the wellbutrin xl 150 and start lexapro 10  mg     Do  not add any other mood altering meds   See me in 1 month   Seek er eval if dark thoughts   -     escitalopram oxalate (LEXAPRO) 10 mg tablet; Take 1 Tablet by mouth daily., Normal, Disp-30 Tablet, R-1   2. Other back pain, unspecified chronicity         No follow-ups on file.      SUBJECTIVE/OBJECTIVE:   HPI   Suffers with chronic depression as well as now chronic back pain and has been seeing dr Zenaida Niece from ortho but wants to get a second opinion on back surgery. This summer her brother passed away and then she decided to stop all of her mood meds hearing his  voice apparently telling her to  not be on so many meds. Sadly she did not reach out to me to discuss this decision. Now reports terribly depressed and pain is still level 4-6/10-taking only valium and gabapentin and requip but none of her mood meds.  Feels the lexapro did better in the past than cymbalta -does  not feel the cymbalta was helping with pain.      Review of Systems       No data recorded       Physical Exam      [INSTRUCTIONS:  "[x]  " Indicates a positive item  "[] " Indicates a  negative item  -- DELETE ALL  ITEMS NOT EXAMINED]      Constitutional: [x]   Appears well-developed and well-nourished [x]  No apparent  distress       []   Abnormal -       Mental status: [x]  Alert and awake  [x]  Oriented to person/place/time [x]   Able to follow commands     []  Abnormal -       Eyes:   EOM    [x]   Normal    []  Abnormal -    Sclera  [x]    Normal    []  Abnormal -           Discharge [x]    None visible   []  Abnormal -       HENT: [x]  Normocephalic, atraumatic  []  Abnormal -    [x]  Mouth/Throat: Mucous membranes  are moist      External Ears [x]  Normal  []  Abnormal -      Neck: [x]  No visualized  mass []  Abnormal -       Pulmonary/Chest: [x]  Respiratory effort normal   [x]  No visualized signs of difficulty breathing or respiratory distress         []   Abnormal -        Musculoskeletal:   [x]   Normal gait with no signs of ataxia          [x]   Normal range of motion of neck         []   Abnormal -       Neurological:        [x]   No Facial Asymmetry (Cranial nerve 7 motor function) (limited exam due to video visit)           [x]   No gaze palsy         []  Abnormal -            Skin:        [x]   No significant exanthematous lesions or discoloration noted on facial skin          []   Abnormal -              Psychiatric:       [x]  Normal Affect []  Abnormal -         [x]   No Hallucinations      Other pertinent observable physical exam findings:-            , was evaluated through a synchronous (real-time) audio-video encounter. The patient (or guardian if applicable) is aware that this is a billable service, which includes applicable  co-pays. This Virtual Visit was conducted with patient's (and/or legal guardian's) consent. The visit was conducted pursuant to the emergency declaration under the and the , 1135 waiver authority and the and Act.  Patient identification was verified, and a caregiver was present when appropriate.   The  patient was located at: Home: [address]   [place]    The provider was located at: Other: office          An electronic signature was used to authenticate this note.   -- , MD

## 2021-04-15 MED ORDER — BUPROPION XL 150 MG 24 HR TAB
150 mg | ORAL_TABLET | ORAL | 1 refills | Status: DC
Start: 2021-04-15 — End: 2021-05-25

## 2021-04-15 NOTE — Telephone Encounter (Signed)
Pt medication for Wellbutrin was sent in with the wrong sig. Resending with the correct sig

## 2021-05-17 ENCOUNTER — Encounter

## 2021-05-25 ENCOUNTER — Ambulatory Visit: Admit: 2021-05-25 | Payer: MEDICARE | Attending: Internal Medicine | Primary: Internal Medicine

## 2021-05-25 DIAGNOSIS — F339 Major depressive disorder, recurrent, unspecified: Secondary | ICD-10-CM

## 2021-05-25 DIAGNOSIS — Z5181 Encounter for therapeutic drug level monitoring: Secondary | ICD-10-CM

## 2021-05-25 LAB — AMB POC ALERE ICUP DX DRUG SCREEN 10
Amphetamine urine , Ql. (POC): NEGATIVE
BARBITURATES UR POC: NEGATIVE
BENZODIAZEPINES UR POC: POSITIVE
Cocaine urine , Ql. (POC): NEGATIVE
LOT EXP DATE POC: 12122023
MDMA/ECSTASY UR POC: NEGATIVE
Marijuana(THC) urine , Ql. (POC): NEGATIVE
Methadone urine , Ql. (POC): 0
Methamphetamine urine , Ql. (POC): NEGATIVE
NTI OTHER MICRO TRANSPORT: 0
OXYCODONE UR POC: NEGATIVE
Opiates urine , Ql. (POC): NEGATIVE

## 2021-05-25 MED ORDER — ESCITALOPRAM 10 MG TAB
10 mg | ORAL_TABLET | Freq: Every evening | ORAL | 4 refills | Status: AC
Start: 2021-05-25 — End: 2021-07-15

## 2021-05-25 MED ORDER — BUPROPION XL 150 MG 24 HR TAB
150 mg | ORAL_TABLET | ORAL | 4 refills | Status: DC
Start: 2021-05-25 — End: 2021-07-15

## 2021-05-25 MED ORDER — ATORVASTATIN 10 MG TAB
10 mg | ORAL_TABLET | Freq: Every day | ORAL | 3 refills | Status: DC
Start: 2021-05-25 — End: 2021-05-26

## 2021-05-25 NOTE — H&P (Signed)
Lindsay [name] (DOB: 01-16-1956) is a 65 y.o. female patient here for evaluation of the following chief complaint(s):  LOW BACK PAIN           ASSESSMENT/PLAN:  Below is the assessment and plan developed based on review of pertinent history, physical exam, labs, studies, and medications.     1. Chronic bilateral low back pain without sciatica  -     XR SPINE LUMB MIN 4 V; Future  2. Chronic midline low back pain without sciatica  3. 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  The procedure again which would be a T10-L2 fusion with cement augmentation.  She would also have a laminectomy at T11-T12 and T12-L1 with osteotomies for correction of the kyphotic deformity.  Risk and benefits were discussed with her again.  She will contact us if she would like to proceed.     Procedure: Lumbar laminectomy T10-L2 with ponte osteotomies and T10-L2 fusion.        The risks and benefits were discussed at length with the patient and the patient has elected to proceed.  Indications for surgery include failed conservative treatment.  Alternative treatments, risks and the perioperative course were discussed with the patient.  All questions were answered.  The risks and benefits of the procedure were explained.  Benefits include definitive diagnosis, relief of pain, elimination of deformity and improved function.  Risks of surgery including bleeding, infection, weakness, numbness, CSF leak, failure to improve symptoms, exacerbation of medical co-morbidities and even death were discussed with the patient.       Date of Surgery Update:  Lindsay [name] was seen and examined.  History and physical has been reviewed. The patient has been examined. There have  been no significant clinical changes since the completion of the originally dated History and Physical.    Signed By: Kem Parkinson, MD     June 03, 2021 7:24 AM                   No follow-ups on file.        SUBJECTIVE/OBJECTIVE:  Lindsay [name] (DOB: [birth year]) is a 65 y.o. female who presents today for the following:      Chief Complaint   Patient presents with    LOW BACK PAIN         She was scheduled for a posterior thoracolumbar fusion for a T12 compression fracture with instability due to vertebral plana.  She postponed this last year.  She comes in today for an evaluation for continued persistent back pain.  It is unchanged.  She has no new symptoms other than the severe back pain with any prolonged standing or walking.  She denies any bowel bladder difficulties.  She is here to discuss possible surgical intervention again.  Denies any new trauma.     Lindsay [name] returns today for a follow-up visit.  She was last seen approximately 3 months ago and was recommended for a T10-L2 posterior spinal fusion secondary to chronic T12 compression fracture with nonunion.  She states that her symptoms began after falling off of a stool in June, 2021.  She had a subsequent kyphoplasty that did not substantially relieve  her symptoms.  It was recommended that given her persistent pain, combined with kyphosis throughout the T11 and T12 vertebra that she was a good candidate for stabilization of her fracture.  She has continued mid back pain with also low back pain.  She has pain radiating into the right lower abdomen.  No leg pain.  No numbness, tingling, or weakness in the legs.  No bowel or bladder changes.       IMAGING:  XR Results (most recent):        MRI Results (most recent):  Results from Hospital Encounter encounter on 12/11/19     MRI [facility] SPINE WO CONT     Narrative  EXAM: MRI [facility] SPINE WO CONT, MRI LUMB SPINE WO CONT     INDICATION: t12 compression fx on 6/3, r/o cord compression.     COMPARISON:  Lumbar spine CT dated 12/06/2019     TECHNIQUE: MR imaging of the thoracic and lumbar spine was performed using the  following sequences: sagittal T1, T2, stir; axial T1, T2. Additional coronal T2  images were obtained through the lumbar spine     CONTRAST: None.     FINDINGS:     Comparison prior study, there is increased loss of height of the T12 vertebral  body with increased bony retropulsion. Superior endplate compression fracture of  L4 is also noted which is likely chronic. There is moderate severe canal  stenosis at T12 with moderate bilateral foraminal narrowing. Possible mild  compression on the spinal cord. No definite abnormal signal is identified in the  conus.     The remaining thoracic spine levels are unremarkable.     T12-L1 demonstrates minimal disc bulge without canal stenosis or foraminal  narrowing.     L1-2 demonstrates no evidence of canal stenosis or foraminal narrowing.     At L2-3, there is mild facet hypertrophy without canal stenosis or foraminal  narrowing.     At L3-4, there is a mild concentric disc bulge and facet hypertrophy with  minimal canal stenosis and minimal bilateral foraminal narrowing.     L4-5 there is severe facet hypertrophy and ligament flavum hypertrophy as well  with a mild disc bulge causing mild canal stenosis. No significant foraminal  narrowing.     L5-S1, there is concentric bulge with osteophyte formation and a probable small  central disc extrusion with cranial migration. Overall canal stenosis is  minimal. The neural foramen are patent.     Incidental soft tissue imaging is unremarkable     Impression  1.  Increase loss of height at T12 with increased bony retropulsion and moderate  severe canal stenosis. Possible compression on the cord although no abnormal  cord signal is identified.     2.  Other degenerative changes in the lumbar spine as described.     3.  Mild superior endplate compression deformity of L4 with a chronic        No Known Allergies           Current Outpatient Medications   Medication Sig    diazePAM (VALIUM) 5 mg tablet TAKE 1 TABLET BY MOUTH IN THE MORNING & 2 TABLET IN THE EVENING DAILY    rOPINIRole (REQUIP) 1 mg tablet TAKE 1 TABLET AT 1-2PM AND TAKE 3 TABLETS BY MOUTH 1 TO 3 HOURS BEFORE BEDTIME EVERYDAY    Vitamin D3 125 mcg (5,000 unit) tab tablet TAKE 1 TABLET BY MOUTH DAILY    omeprazole (PRILOSEC OTC) 20 mg  tablet Take 20 mg by mouth.    atorvastatin (LIPITOR) 10 mg tablet Take 1 Tab by mouth daily.    buPROPion XL (WELLBUTRIN XL) 150 mg tablet Take 150 mg in the morning. Take 1 tab BID    aspirin 81 mg chewable tablet Take 1 Tab by mouth daily.    gabapentin enacarbil (Horizant) 600 mg TbER TAKE 1 TABLET BY MOUTH TWICE A DAY    DULoxetine (CYMBALTA) 60 mg capsule TAKE 1 CAPSULE BY MOUTH EVERY DAY (Patient not taking: Reported on 03/12/2021)    lidocaine (Lidocaine Pain Relief) 4 % patch 1 Patch by TransDERmal route every twelve (12) hours every twelve (12) hours. (Patient not taking: Reported on 03/12/2021)    methocarbamoL (Robaxin-750) 750 mg tablet Take 1 Tablet by mouth four (4) times daily as needed for Muscle Spasm(s). (Patient not taking: Reported on 03/12/2021)      No current facility-administered medications for this visit.              Past Medical History:   Diagnosis Date    Abnormal brain scan      Anxiety disorder      Arthritis      Chronic pain      Depression      DVT (deep venous thrombosis) (HCC)      Falls frequently      Fracture of left ankle      Headache(784.0) 2011    Ill-defined condition 09/15/2015     FX R HAND    Neurocutaneous syndrome (HCC)      Other and unspecified hyperlipidemia 12/27/2012    PE (pulmonary embolism) 12/2012    RLS (restless legs syndrome) 03/16/2016     Dr Scarlett Presto    TIA (transient ischemic attack) 03/2016     Right facial droop, numbness in lip    Torn rotator cuff       right               Past Surgical History:   Procedure Laterality Date    HX APPENDECTOMY        HX CESAREAN SECTION          x4    HX GI         COLONOSCOPY    HX KNEE REPLACEMENT Left 10/29/2015    HX ORTHOPAEDIC   08/2004     rotater cuff r. shoulder    HX ORTHOPAEDIC         left ankle fracture treated with air cast    HX ORTHOPAEDIC   6/13     left shoulder fxt and dislocation    HX OTHER SURGICAL   2010     left foot torn tendon    HX TUBAL LIGATION        IMPLANT BREAST SILICONE/EQ   2002     Bilateral breast implants    IR KYPHOPLASTY THORACIC   12/12/2019    PR BREAST SURGERY PROCEDURE UNLISTED   2002     AUGMENTATION               Family History   Problem Relation Age of Onset    Heart Disease Mother      Heart Disease Father      Cancer Father           thyroid cancer    Arthritis-rheumatoid Sister      No Known Problems Son      No Known Problems Daughter  No Known Problems Daughter      No Known Problems Daughter      Anesth Problems Neg Hx           Social History            Tobacco Use    Smoking status: Never    Smokeless tobacco: Never   Substance Use Topics    Alcohol use: No       Comment: Pt denies         Review of Systems   Constitutional: Negative.    Respiratory: Negative.     Cardiovascular: Negative.    Gastrointestinal: Negative.    Endocrine: Negative.    Genitourinary: Negative.    Musculoskeletal:  Positive for back pain.   Skin: Negative.    Allergic/Immunologic: Negative.    Hematological: Negative.    Psychiatric/Behavioral: Negative.     All other systems reviewed and are negative.        Pain Assessment  03/12/2021   Location of Pain Back   Location Modifiers Posterior   Severity of Pain 8             Vitals:  Ht 5\' 3"  (1.6 m)    Wt 135 lb (61.2 kg)    BMI 23.91 kg/m??    Body mass index is 23.91 kg/m??.     Physical Exam     Neurologic  Sensory  Light Touch - Intact - Globally.  Overall Assessment of Muscle Strength and Tone reveals  Lower Extremities - Right Iliopsoas - 5/5. Left Iliopsoas - 5/5. Right Tibialis Anterior - 5/5. Left Tibialis Anterior - 5/5. Right Gastroc-Soleus - 5/5. Left  Gastroc-Soleus - 5/5. Right EHL - 5/5. Left EHL - 5/5.  General Assessment of Reflexes  Right Ankle - Clonus is not present. Left Ankle - Clonus is not present.  Reflexes (Dermatomes)  2/2 Normal - Left Achilles (L5-S2), Left Knee (L2-4), Right Achilles (L5-S2) and Right Knee (L2-4).     Musculoskeletal  Global Assessment  Examination of related systems reveals - well-developed, well-nourished, in no acute distress, alert and oriented x 3. Gait and Station - normal gait and station and normal posture. Right Lower Extremity - normal strength and tone, normal range of motion without pain and no instability, subluxation or laxity. Left Lower Extremity - normal strength and tone, normal range of motion without pain and no instability, subluxation or laxity.  Spine/Ribs/Pelvis--moderate kyphosis noted at the thoracolumbar junction  Cervical Spine - Examination of the cervical spine reveals - no tenderness to palpation, no pain, no swelling, edema or erythema, normal cervical spine movements and normal sensation. Thoracic (Dorsal) Spine - Examination of the thoracic spine reveals - no tenderness over thoracic vertebrae, no pain, normal sensation and normal thoracic spine movements. Lumbosacral Spine - Examination of the lumbosacral spine reveals - no known fractures or deformities. Inspection and Palpation - Tenderness - moderate. Assessment of pain reveals the following findings - The pain is characterized as - moderate. Location - pain refers to lower back bilaterally. ROJM - Trunk Extension - 15 degrees. Lumbar Spine Flexion - 35 ??. Lumbosacral Spine - Functional Testing - Babinski Test negative, Prone Knee Bending Test negative, Slump Test negative, Straight Leg Raising Test negative.        An electronic signature was used to authenticate this note.  -- , MD

## 2021-05-25 NOTE — Progress Notes (Signed)
HISTORY OF PRESENT ILLNESS  Lindsay [name] is a 65 y.o. female.  HPI  Lindsay [name] is seen at one month follow up.  She is now back on Lexapro 10 and Wellbutrin XL 150 in the morning.  She started the Wellbutrin about three weeks ago, does feel enormously better.  Family concurs she is happier, more content, sleeping and eating are fine.  Nervous about upcoming back surgery, still using Valium, a total of three a day, and using Requip and Horizant for his restless legs.  Discussed she may be able to decrease this once her back is in better shape.      Review of Systems   Constitutional:  Negative for chills, fever and weight loss.   Respiratory:  Negative for cough, shortness of breath and wheezing.    Cardiovascular:  Negative for chest pain, palpitations, orthopnea, leg swelling and PND.   Gastrointestinal:  Negative for abdominal pain, heartburn and nausea.   Musculoskeletal:  Positive for back pain. Negative for myalgias.   Neurological:  Negative for dizziness and headaches.   Psychiatric/Behavioral:  Negative for depression. The patient is nervous/anxious. The patient does not have insomnia.      Physical Exam  Vitals and nursing note reviewed.   Constitutional:       Appearance: She is well-developed.   HENT:      Head: Normocephalic and atraumatic.   Neck:      Thyroid: No thyromegaly.      Vascular: No carotid bruit.   Cardiovascular:      Rate and Rhythm: Normal rate and regular rhythm.      Heart sounds: Normal heart sounds, S1 normal and S2 normal. No murmur heard.  Pulmonary:      Effort: Pulmonary effort is normal. No respiratory distress.      Breath sounds: Normal breath sounds. No wheezing or rales.   Musculoskeletal:      Cervical back: Normal range of motion and neck supple.   Neurological:      Mental Status: She is alert and oriented to person, place, and time.   Psychiatric:         Behavior: Behavior normal.       ASSESSMENT and PLAN  Diagnoses and all orders for this visit:    1. Medication  monitoring encounter  -     AMB POC ALERE ICUP DX DRUG SCREEN 10    2. Recurrent depression (HCC)  -     buPROPion XL (WELLBUTRIN XL) 150 mg tablet; Take 1 Tablet by mouth every morning.  -     escitalopram oxalate (LEXAPRO) 10 mg tablet; Take 1 Tablet by mouth nightly.    3. Other back pain, unspecified chronicity    4. Dyslipidemia, goal LDL below 100  -     atorvastatin (LIPITOR) 10 mg tablet; Take 1 Tablet by mouth daily.      the following changes in treatment are made: resume statin  Appt in 8mo with fasting labs

## 2021-05-26 ENCOUNTER — Encounter: Primary: Internal Medicine

## 2021-05-26 ENCOUNTER — Inpatient Hospital Stay: Admit: 2021-05-26 | Payer: MEDICARE | Primary: Internal Medicine

## 2021-05-26 DIAGNOSIS — Z01818 Encounter for other preprocedural examination: Secondary | ICD-10-CM

## 2021-05-26 LAB — URINALYSIS W/ REFLEX CULTURE
BACTERIA, URINE: NEGATIVE /hpf
Bacteria: NEGATIVE /hpf
Bilirubin, Urine: NEGATIVE
Bilirubin: NEGATIVE
Blood, Urine: NEGATIVE
Blood: NEGATIVE
Glucose, Ur: NEGATIVE mg/dL
Glucose: NEGATIVE mg/dL
Ketone: NEGATIVE mg/dL
Ketones, Urine: NEGATIVE mg/dL
Leukocyte Esterase, Urine: NEGATIVE
Leukocyte Esterase: NEGATIVE
Nitrite, Urine: NEGATIVE
Nitrites: NEGATIVE
Protein, UA: NEGATIVE mg/dL
Protein: NEGATIVE mg/dL
Specific Gravity, UA: 1.017 (ref 1.003–1.030)
Specific gravity: 1.017 (ref 1.003–1.030)
Urobilinogen, UA, POCT: 0.2 EU/dL (ref 0.2–1.0)
Urobilinogen: 0.2 EU/dL (ref 0.2–1.0)
pH (UA): 8 (ref 5.0–8.0)
pH, UA: 8 (ref 5.0–8.0)

## 2021-05-26 LAB — CBC
Hematocrit: 34.2 % — ABNORMAL LOW (ref 35.0–47.0)
Hemoglobin: 10.6 g/dL — ABNORMAL LOW (ref 11.5–16.0)
MCH: 27 PG (ref 26.0–34.0)
MCHC: 31 g/dL (ref 30.0–36.5)
MCV: 87.2 FL (ref 80.0–99.0)
MPV: 9.8 FL (ref 8.9–12.9)
NRBC Absolute: 0 10*3/uL (ref 0.00–0.01)
Nucleated RBCs: 0 PER 100 WBC
Platelets: 225 10*3/uL (ref 150–400)
RBC: 3.92 M/uL (ref 3.80–5.20)
RDW: 13.6 % (ref 11.5–14.5)
WBC: 4.9 10*3/uL (ref 3.6–11.0)

## 2021-05-26 LAB — BASIC METABOLIC PANEL
Anion Gap: 6 mmol/L (ref 5–15)
BUN: 16 MG/DL (ref 6–20)
Bun/Cre Ratio: 21 — ABNORMAL HIGH (ref 12–20)
CO2: 29 mmol/L (ref 21–32)
Calcium: 8.6 MG/DL (ref 8.5–10.1)
Chloride: 107 mmol/L (ref 97–108)
Creatinine: 0.75 MG/DL (ref 0.55–1.02)
ESTIMATED GLOMERULAR FILTRATION RATE: >60 mL/min/{1.73_m2} (ref >60)
Glucose: 107 mg/dL — ABNORMAL HIGH (ref 65–100)
Potassium: 4.8 mmol/L (ref 3.5–5.1)
Sodium: 142 mmol/L (ref 136–145)

## 2021-05-26 LAB — PROTIME-INR
INR: 0.9 (ref 0.9–1.1)
Protime: 9.5 s (ref 9.0–11.1)

## 2021-05-26 LAB — HEMOGLOBIN A1C W/EAG
Hemoglobin A1C: 5.4 % (ref 4.0–5.6)
eAG: 108 mg/dL

## 2021-05-26 LAB — PROTHROMBIN TIME + INR
INR: 0.9 (ref 0.9–1.1)
Prothrombin time: 9.5 s (ref 9.0–11.1)

## 2021-05-26 LAB — METABOLIC PANEL, BASIC
Anion gap: 6 mmol/L (ref 5–15)
BUN/Creatinine ratio: 21 — ABNORMAL HIGH (ref 12–20)
BUN: 16 MG/DL (ref 6–20)
CO2: 29 mmol/L (ref 21–32)
Calcium: 8.6 MG/DL (ref 8.5–10.1)
Chloride: 107 mmol/L (ref 97–108)
Creatinine: 0.75 MG/DL (ref 0.55–1.02)
Glucose: 107 mg/dL — ABNORMAL HIGH (ref 65–100)
Potassium: 4.8 mmol/L (ref 3.5–5.1)
Sodium: 142 mmol/L (ref 136–145)
eGFR: >60 mL/min/{1.73_m2} (ref >60)

## 2021-05-26 LAB — HEMOGLOBIN A1C WITH EAG
Est. average glucose: 108 mg/dL
Hemoglobin A1c: 5.4 % (ref 4.0–5.6)

## 2021-05-26 LAB — CBC W/O DIFF
ABSOLUTE NRBC: 0 10*3/uL (ref 0.00–0.01)
HCT: 34.2 % — ABNORMAL LOW (ref 35.0–47.0)
HGB: 10.6 g/dL — ABNORMAL LOW (ref 11.5–16.0)
MCH: 27 PG (ref 26.0–34.0)
MCHC: 31 g/dL (ref 30.0–36.5)
MCV: 87.2 FL (ref 80.0–99.0)
MPV: 9.8 FL (ref 8.9–12.9)
NRBC: 0 PER 100 WBC
PLATELET: 225 10*3/uL (ref 150–400)
RBC: 3.92 M/uL (ref 3.80–5.20)
RDW: 13.6 % (ref 11.5–14.5)
WBC: 4.9 10*3/uL (ref 3.6–11.0)

## 2021-05-26 NOTE — Interval H&P Note (Signed)
Preoperative instructions reviewed with patient.  Patient given two bottles of CHG soap.  Instructions (reviewed/to be reviewed in class) on use of CHG soap.  Patient given SSI infection FAQS sheet, as well as a MRSA/MSSA treatment instruction sheet with an explanation to patient that they will be notified if treatment instructions need to be initiated.  Patient was given the opportunity to ask questions on the information provided.     PAT Nutrition Screen    1. Has the patient had an unplanned weight loss of 10% of body weight or more in the last 6 months? No = 0   2. Has the patient been eating less than 50% of their normal diet in the preceding week? No = 0       Patient instructed on Non-Diabetic pre-operative nutrition plan to start 5 days prior to surgery. Patient given 10 shakes and 3 pre-surgery drinks. Opportunity given for questions and all questions answered.

## 2021-05-26 NOTE — Interval H&P Note (Signed)
PAT Nurse Practitioner   Pre-Operative Chart Review/Assessment:-ORTHOPEDIC/NEUROSURGICAL SPINE                Patient Name:  Lindsay [name]                                                           Age:   65 y.o.    DOB:  July 13, 1955     Today's Date:  06/01/2021     Date of PAT:   05/26/21      Date of Surgery:    06/03/21      Procedure(s):  T10 - L2 POSTERIOR SPINAL FUSION WITH T11 - T12 THORACIC LAMINECTOMY      Surgeon:   Dr. Vernell Leep                       PLAN:       1)  PCP: Mellody Drown, MD        2)  Cardiac Clearance: EKG and METs reviewed. No further cardiac evaluation requested. EKG from 01/10/21 showed NSR.       3)  Diabetic Treatment Consult: Not indicated. A1c-5.4       4)  Sleep Apnea evaluation:  Not indicated. OSA Score 1.       5)  Treatment for MRSA/Staph Aureus: Neg       6)  Additional Concerns: TIA, hx of DVT/PE, Anemia(hgb-10.6 on PAT labs), dep/anx, frequent falls              Vital Signs:         Vitals:    05/26/21 1125   BP: 120/71   Pulse: 82   Temp: 98.9 ??F (37.2 ??C)   SpO2: 97%   Weight: 67.3 kg (148 lb 6.4 oz)   Height: 5' 2.5" (1.588 m)          Body mass index is 26.71 kg/m??.     Preoperative Nutrition Screen (PONS)   Patient's Age: 65 y.o.    Patient's BMI: Estimated body mass index is 26.71 kg/m?? as calculated from the following:    Height as of this encounter: 5' 2.5" (1.588 m).    Weight as of this encounter: 67.3 kg (148 lb 6.4 oz).     If the answer to any of the following is Yes, then recommend prescribe Oral Nutrition Supplements (ONS) for at least 5 days prior to surgery and/or order referral to dietitian for further assessment and nutrition therapy.    1. Does the patient have a documented serum albumin less than 3.0 within the last 90 days?      Unknown = 0     2. Is patient's BMI less than 18.5 (or less than 20 if age over 49)?  No = 0      3. Has the patient had an unplanned weight loss of 10% of body weight or more in the last 6 months? No = 0   4. Has the patient  been eating less than 50% of their normal diet in the preceding week? No = 0   PONS Score (number of Yes responses), 0-4 0     Plan:   2 daily immuno nutrition shakes, 5 days prior to surgery and 5 days post-operatively.  3 pre-surgery drinks.  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 shoulder    HX ORTHOPAEDIC      left ankle fracture treated with air cast    HX ORTHOPAEDIC  12/2011    left shoulder fxt and dislocation    HX OTHER SURGICAL  2010    left foot torn tendon    HX TUBAL LIGATION      IMPLANT BREAST SILICONE/EQ  1761    Bilateral breast implants    IR KYPHOPLASTY THORACIC  12/12/2019    PR BREAST SURGERY PROCEDURE UNLISTED  2002    AUGMENTATION     ____________________________________________  HOME MEDICATIONS    Current Outpatient Medications   Medication Sig    ibuprofen (Motrin IB) 200 mg tablet Take 200 mg by mouth as needed for Pain.    buPROPion XL (WELLBUTRIN XL) 150 mg tablet Take 1 Tablet by mouth every morning.    escitalopram oxalate  (LEXAPRO) 10 mg tablet Take 1 Tablet by mouth nightly. (Patient taking differently: Take 20 mg by mouth nightly.)    diazePAM (VALIUM) 5 mg tablet TAKE 1 TABLET BY MOUTH IN THE MORNING & 2 TABLET IN THE EVENING DAILY    rOPINIRole (REQUIP) 1 mg tablet TAKE 1 TABLET AT 1-2PM AND TAKE 3 TABLETS BY MOUTH 1 TO 3 HOURS BEFORE BEDTIME EVERYDAY    gabapentin enacarbil (Horizant) 600 mg TbER TAKE 1 TABLET BY MOUTH TWICE A DAY    Vitamin D3 125 mcg (5,000 unit) tab tablet TAKE 1 TABLET BY MOUTH DAILY    omeprazole (PRILOSEC OTC) 20 mg tablet Take 20 mg by mouth every evening.    aspirin 81 mg chewable tablet Take 1 Tab by mouth daily.     No current facility-administered medications for this encounter.      ____________________________________________  ALLERGIES  No Known Allergies   ____________________________________________  SOCIAL HISTORY  Social History     Tobacco Use    Smoking status: Never    Smokeless tobacco: Never   Substance Use Topics    Alcohol use: No     Comment: Pt denies      ____________________________________________   Internal Administration   First Dose COVID-19, PFIZER PURPLE top, DILUTE  for use, (age 24 y+), IM, 18mg/0.3mL  11/01/2019   Second Dose COVID-19, PFIZER PURPLE top, DILUTE for use, (age 6552y+), IM, 367m/0.3mL  02/03/2020      Last COVID Lab No results found for: SARSCOV2, COV2NT, RCV2CT, CVD2M, COV2, XPLCVT, SARSCOV2POC, SARSCOV2RNAP, COLamarXJCOVT, [place]             ____________________________________________    Labs:     Hospital Outpatient Visit on 05/26/2021   Component Date Value Ref Range Status    Sodium 05/26/2021 142  136 - 145 mmol/L Final    Potassium 05/26/2021 4.8  3.5 - 5.1 mmol/L Final    Chloride 05/26/2021 107  97 - 108 mmol/L Final    CO2 05/26/2021 29  21 - 32 mmol/L Final    Anion gap 05/26/2021 6  5 - 15 mmol/L Final    Glucose 05/26/2021 107 (A)  65 - 100 mg/dL Final    BUN 05/26/2021 16  6 - 20 MG/DL Final    Creatinine 05/26/2021 0.75   0.55 - 1.02 MG/DL Final    BUN/Creatinine ratio 05/26/2021 21 (A)  12 - 20   Final    eGFR 05/26/2021 >60  >60 ml/min/1.736minal    Comment:      Pediatric calculator link: https://www.kidney.org/professionals/kdoqi/gfr_calculatorped       Effective Apr 06, 2021       These results are not intended for use in patients <18 65ars of age.       eGFR results are calculated without a race factor using  the 2021 CKD-EPI equation. Careful clinical correlation is recommended, particularly when comparing to results calculated using previous equations.  The CKD-EPI equation is less accurate in patients with extremes of muscle mass, extra-renal metabolism of creatinine, excessive creatine ingestion, or following therapy that affects renal tubular secretion.      Calcium 05/26/2021 8.6  8.5 - 10.1 MG/DL Final    WBC 05/26/2021 4.9  3.6 - 11.0 K/uL Final    RBC 05/26/2021 3.92  3.80 - 5.20 M/uL Final    HGB 05/26/2021 10.6 (A)  11.5 - 16.0 g/dL Final    HCT 05/26/2021 34.2 (A)  35.0 - 47.0 % Final    MCV 05/26/2021 87.2  80.0 - 99.0 FL Final    MCH 05/26/2021 27.0  26.0 - 34.0 PG Final    MCHC 05/26/2021 31.0  30.0 - 36.5 g/dL Final    RDW 05/26/2021 13.6  11.5 - 14.5 % Final    PLATELET 05/26/2021 225  150 - 400 K/uL Final    MPV 05/26/2021 9.8  8.9 - 12.9 FL Final    NRBC 05/26/2021 0.0  0 PER 100 WBC Final    ABSOLUTE NRBC 05/26/2021 0.00  0.00 - 0.01 K/uL Final    Crossmatch Expiration 05/26/2021 06/06/2021,2359   Final    ABO/Rh(D) 05/26/2021 AB POSITIVE   Final    Antibody screen 05/26/2021 POS   Final    Antibody ID 05/26/2021 ANTI-E   Final    ANTIGEN TYPING 05/26/2021    Final                    Value:E NEGATIVE,  c POSITIVE,      INR 05/26/2021 0.9  0.9 - 1.1   Final    A single therapeutic range for Vit K antagonists may not be optimal for all indications - see June, 2008 issue of Chest, American College of Chest Physicians Evidence-Based Clinical Practice  Guidelines, 8th Edition.    Prothrombin time 05/26/2021 9.5   9.0 - 11.1 sec Final    Color 05/26/2021 YELLOW/STRAW    Final    Color Reference Range: Straw, Yellow or Dark Yellow    Appearance 05/26/2021 CLEAR  CLEAR   Final    Specific gravity 05/26/2021 1.017  1.003 - 1.030   Final    pH (UA) 05/26/2021 8.0  5.0 - 8.0   Final    Protein 05/26/2021 Negative  NEG mg/dL Final    Glucose 05/26/2021 Negative  NEG mg/dL Final    Ketone 05/26/2021 Negative  NEG mg/dL Final    Bilirubin 05/26/2021 Negative  NEG   Final    Blood 05/26/2021 Negative  NEG   Final    Urobilinogen 05/26/2021 0.2  0.2 - 1.0 EU/dL Final    Nitrites 05/26/2021 Negative  NEG   Final    Leukocyte Esterase 05/26/2021 Negative  NEG   Final    UA:UC IF INDICATED 05/26/2021 CULTURE NOT INDICATED BY UA RESULT  CNI   Final    WBC 05/26/2021 0-4  0 - 4 /hpf Final    RBC 05/26/2021 0-5  0 - 5 /hpf Final    Epithelial cells 05/26/2021 FEW  FEW /lpf Final    Epithelial cell category consists of squamous cells and /or transitional urothelial cells. Renal tubular cells, if present, are separately identified as such.    Bacteria 05/26/2021 Negative  NEG /hpf Final    Hyaline cast 05/26/2021 0-2  0 - 5 /lpf Final    Hemoglobin A1c 05/26/2021 5.4  4.0 - 5.6 % Final    Comment: NEW METHOD  PLEASE NOTE NEW REFERENCE RANGE  (NOTE)  HbA1C Interpretive Ranges  <5.7              Normal  5.7 - 6.4         Consider Prediabetes  >6.5              Consider Diabetes      Est. average glucose 05/26/2021 108  mg/dL Final    Special Requests: 05/26/2021 NO SPECIAL REQUESTS    Final    Culture result: 05/26/2021 MRSA NOT PRESENT    Final    Culture result: 05/26/2021     Final                    Value:Screening of patient nares for MRSA is for surveillance purposes and, if positive, to facilitate isolation considerations in high risk settings. It is not intended for automatic decolonization interventions per se as regimens are not sufficiently effective to warrant routine use.     Office Visit on 05/25/2021   Component Date Value Ref  Range Status    LOT NUMBER 05/25/2021 [id number]   Final    LOT EXP DATE POC 05/25/2021 [id number]   Final    VALID INTERNAL CONTROL POC 05/25/2021 Yes   Final    BENZODIAZEPINES UR POC 05/25/2021 Presumptive Positive   Final    BARBITURATES UR POC 05/25/2021 Negative   Final    Methamphetamine urine , Ql. (POC) 05/25/2021 Negative   Final    Opiates urine , Ql. (POC) 05/25/2021 Negative   Final    OXYCODONE UR POC 05/25/2021 Negative   Final    MDMA/ECSTASY UR POC 05/25/2021 Negative   Final    NTI OTHER MICRO TRANSPORT 05/25/2021 0   Final    Cocaine urine , Ql. (POC) 05/25/2021 Negative   Final  Methadone urine , Ql. (POC) 05/25/2021 0   Final    Marijuana(THC) urine , Ql. (POC) 05/25/2021 Negative   Final    Amphetamine urine , Ql. (POC) 05/25/2021 Negative   Final        Skin:     Denies open wounds, cuts, sores, rashes or other areas of concern in PAT assessment.        York Spaniel, NP  Available via [facility]

## 2021-05-26 NOTE — Interval H&P Note (Signed)
[facility] PREOPERATIVE INSTRUCTIONS  ORTHOPAEDIC    Surgery Date:   06/03/21    Your surgeon's office or [facility] staff will call you between 4 PM- 8 PM the day before surgery with your arrival time. If your surgery is on a Monday, you will receive a call the preceding Friday.    Please report to [facility] Patient Access/Admitting on the 1st floor.  Bring your insurance card, photo identification, and any copayment (if applicable).   If you are going home the same day of your surgery, you must have a responsible adult to drive you home. You need to have a responsible adult to stay with you the first 24 hours after surgery and you should not drive a car for 24 hours following your surgery.  Do NOT eat any solid foods after midnight the night before surgery including candy, mints or gum.  You may drink clear liquids from midnight until 1 hour prior to arrival time. You may drink up to 12 ounces at one time every 4 hours.   Do NOT drink alcohol or smoke 24 hours before surgery. STOP smoking for 14 days prior as it helps with breathing and healing after surgery.  If your arrival time is 3pm or later, you may eat a light breakfast before 8am (toast, bagel-no butter, black coffee, plain tea, fruit juice-no pulp) Please note special instructions, if applicable, below for medications.  If you are being admitted to the hospital,please leave personal belongings/luggage in your car until you have an assigned hospital room number.  Please wear comfortable clothes. Wear your glasses instead of contacts. We ask that all money, jewelry and valuables be left at home. Wear no make up, particularly mascara, the day of surgery.    All body piercings, rings, and jewelry need to be removed and left at home. Please remove any nail polish or artificial nails from your fingernails. Please wear your hair loose or down. Please no pony-tails, buns, or any metal hair accessories. If you shower the morning of surgery, please do not  apply any lotions or powders afterwards.  You may wear deodorant. Do not shave any body area within 24 hours of your surgery.  Please follow all instructions to avoid any potential surgical cancellation.  Should your physical condition change, (i.e. fever, cold, flu, etc.) please notify your surgeon as soon as possible.  It is important to be on time. If a situation occurs where you may be delayed, please call:  [phone] / [phone] on the day of surgery.  The Preadmission Testing staff can be reached at [phone].  Special instructions: NONE    Current Outpatient Medications   Medication Sig    ibuprofen (Motrin IB) 200 mg tablet Take 200 mg by mouth as needed for Pain.    buPROPion XL (WELLBUTRIN XL) 150 mg tablet Take 1 Tablet by mouth every morning.    escitalopram oxalate (LEXAPRO) 10 mg tablet Take 1 Tablet by mouth nightly. (Patient taking differently: Take 20 mg by mouth nightly.)    diazePAM (VALIUM) 5 mg tablet TAKE 1 TABLET BY MOUTH IN THE MORNING & 2 TABLET IN THE EVENING DAILY    rOPINIRole (REQUIP) 1 mg tablet TAKE 1 TABLET AT 1-2PM AND TAKE 3 TABLETS BY MOUTH 1 TO 3 HOURS BEFORE BEDTIME EVERYDAY    gabapentin enacarbil (Horizant) 600 mg TbER TAKE 1 TABLET BY MOUTH TWICE A DAY    Vitamin D3 125 mcg (5,000 unit) tab tablet TAKE  1 TABLET BY MOUTH DAILY    omeprazole (PRILOSEC OTC) 20 mg tablet Take 20 mg by mouth every evening.    aspirin 81 mg chewable tablet Take 1 Tab by mouth daily.     No current facility-administered medications for this encounter.       YOU MUST ONLY TAKE THESE MEDICATIONS THE MORNING OF SURGERY WITH A SIP OF WATER: BUPROPION  MEDICATIONS TO TAKE THE MORNING OF SURGERY ONLY IF NEEDED: DIAZEPAM  HOLD these prescription medications BEFORE Surgery: NONE  Ask your surgeon/prescribing physician about when/if to STOP taking these medications: NONE  Stop any non-steroidal anti-inflammatory drugs (i.e. Ibuprofen, Naproxen, Advil, Aleve) 7 days before surgery. You may take  Tylenol.  STOP all vitamins and herbal supplements 1 week prior to  surgery.  If you are currently taking Plavix, Coumadin, or any other blood-thinning/anticoagulant medication contact your prescribing physician for instructions.    Preventing Infections Before and After - Your Surgery    IMPORTANT INSTRUCTIONS    You play an important role in your health and preparation for surgery. To reduce the germs on your skin you will need to shower with CHG soap (Chorhexidine gluconate 4%) two times before surgery.    CHG soap (Hibiclens, Hex-A-Clens or store brand)  CHG soap will be provided at your Preadmission Testing (PAT) appointment.  If you do not have a PAT appointment before surgery, you may arrange to pick up CHG soap from our office or purchase CHG soap at a pharmacy, grocery or department store.  You need to purchase TWO 4 ounce bottles to use for your 2 showers.    Steps to follow:  Wash your hair with your normal shampoo and your body with regular soap and rinse well to remove shampoo and soap from your skin.  Wet a clean washcloth and turn off the shower.  Put CHG soap on washcloth and apply to your entire body from the neck down. Do not use on your head, face or private parts(genitals). Do not use CHG soap on open sores, wounds or areas of skin irritation.  Wash you body gently for 5 minutes. Do not wash your skin too hard. This soap does not create lather. Pay special attention to your underarms and from your belly button to your feet.  Turn the shower back on and rinse well to get CHG soap off your body.  Pat your skin dry with a clean, dry towel. Do not apply lotions or moisturizer.  Put on clean clothes and sleep on fresh bed sheets and do not allow pets to sleep with you.    Shower with CHG soap 2 times before your surgery  The evening before your surgery  The morning of your surgery      Tips to help prevent infections after your surgery:  Protect your surgical wound from germs:  Hand washing is the  most important thing you and your caregivers can do to prevent infections.  Keep your bandage clean and dry!  Do not touch your surgical wound.  Use clean, freshly washed towels and washcloths every time you shower; do not share bath linens with others.  Until your surgical wound is healed, wear clothing and sleep on bed linens each day that are clean and freshly washed.  Do not allow pets to sleep in your bed with you or touch your surgical wound.  Do not smoke - smoking delays wound healing. This may be a good time to stop smoking.  If you have  diabetes, it is important for you to manage your blood sugar levels properly before your surgery as well as after your surgery. Poorly managed blood sugar levels slow down wound healing and prevent you from healing completely.    Prevention of Infection  Testing for Staphylococcus aureus on your skin before surgery    Staphylococcus aureus (staph) is a common bacteria that is found on the body. It normally does not cause infection on healthy skin. Before surgery, you will be tested to see if you have staph by swabbing the inside of your nose. When you have an incision with surgery, the goal is to protect that incision from infection. Removal of the staph bacteria before surgery can decrease the risk of a surgical site infection.    If your nose swab is positive for staph you will be called. Your treatment will include 2 steps:  Prescription for Mupirocin ointment to be used in each nostril twice a day for 5 days.  Showering with Chlorhexidine (CHG) liquid soap for 5 days prior to surgery.    How to use Mupirocin ointment in your nose  Pick up the prescription from your pharmacy. You will receive a large tube of ointment which will be big enough for all of your treatments. You will apply this ointment to each nostril 2 times a day for 5 days.  Wash your hands with sanitizer gel or soap and water for 20 seconds before using ointment.  Place a pea-sized amount of ointment on a  cotton Q-tip.  Apply ointment just inside of each nostril with the Q-tip. Do not push Q-tip or ointment deep inside you nose.  Press your nostrils together and massage for a few seconds.  Wash your hands with sanitizer gel or soap and water after you are finished.  Do not get ointment near your eyes. If it gets into your eyes, rinse them with cool water.  If you need to use nasal spray, clean the tip of the bottle with alcohol before use and do not use both at the same time.  If you are scheduled for COVID testing during the 5 days, do NOT apply morning dose until after the COVID test has been performed.     How to use Chlorhexidine (CHG) 4% liquid soap  Purchase an 8 ounce bottle of CHG liquid soap (Chlorhexidine 4%, Hibiclens, Hex-A-Clens or store brand) at a pharmacy or grocery store.  Wash your hair with your normal shampoo and your body with regular soap and rinse well to remove shampoo and soap from your skin.  Wet a clean washcloth and turn off the shower.  Put CHG soap on washcloth and apply to your entire body from the neck down. Do not use on your head, face or private parts(genitals). Do not use CHG soap on open sores, wounds or areas of skin irritation.  Wash your body gently for 5 minutes. Do not wash your skin too hard. This soap does not create lather. Pay special attention to your underarms and from your belly button to your feet.  Turn the shower back on and rinse well to get CHG soap off your body.  Pat your skin dry with a clean, dry towel. Do not apply lotions or moisturizer.  Put on clean clothes and sleep on fresh bed sheets the night before surgery. Do not allow pets to sleep with you.    Eating and Drinking Before Surgery    You may eat a regular dinner at the usual time  on the day before your surgery.  Do NOT eat any solid foods after midnight unless your arrival time at the hospital is 3pm or later.  You may drink clear liquids only from 12 midnight until 1 hours prior to your arrival time  at the hospital on the day of your surgery. Do NOT drink alcohol.  Clear liquids include:  Water  Fruit juices without pulp( i.e. apple juice)  Carbonated beverages  Black coffee (no cream/milk)  Tea (no cream/milk)  Gatorade  You may drink up to 12-16 ounces at one time every 4 hours between the hours of midnight and 1 hour before your arrival time at the hospital. Example- if your arrival time at the hospital is 6am, you may drink 12-16 ounces of clear liquids no later than 5am.  If your arrival time at the hospital is 3pm or later, you may eat a light breakfast before 8am.  A light breakfast includes:  Toast or bagel (no butter)  Black coffee (no cream/milk)  Tea (no cream/milk)  Fruit juices without pulp ( i.e. apple juice)  Do NOT eat meat, eggs, vegetables or fruit  If you have any questions, please contact your surgeon's office.          Patient Information Regarding COVID Restrictions    Day of Procedure    Please park in the parking deck or any designated visitor parking lot.  Enter the facility through the Main Entrance of the hospital.  On the day of surgery, please provide the cell phone number of the person who will be waiting for you to the Patient Access representative at the time of registration.  Masks are highly recommended in the hospital, but not required.  Once your procedure and the immediate recovery period is completed, a nurse in the recovery area will contact your designated visitor to inform them of your room number or to otherwise review other pertinent information regarding your care.    Social distancing practices are strongly encouraged in waiting areas and the cafeteria.       The patient was contacted in person.   She verbalized understanding of all instructions and does not  need reinforcement.

## 2021-05-27 LAB — CULTURE, MRSA

## 2021-06-02 NOTE — Telephone Encounter (Signed)
Pre-op form received & placed on PCP desk to complete.

## 2021-06-02 NOTE — Telephone Encounter (Signed)
Per PCP she will fill out the form. Awaiting form to be faxed to the office.

## 2021-06-02 NOTE — Telephone Encounter (Signed)
Telephone Encounter by Zelphia Cairo, LPN at 26/94/85 1158                Author: Zelphia Cairo, LPN  Service: --  Author Type: Licensed Nurse       Filed: 06/02/21 1158  Encounter Date: 06/02/2021  Status: Signed          Editor: Zelphia Cairo, LPN (Licensed Nurse)          From: Nicki [place]: Clarene Critchley, MDSent: 06/02/2021 11:55 AM ESTSubject: Authorization Form- surgery 11/30Hi Dr. Darl Pikes, We talked last week  about my upcoming surgery on Nov. 30th. Theyve just called from the surgery center saying I needed to have you sign this form. I left a message with the front desk about this, but I wasnt sure if you would have the form or if the surgery  center would send it over. Im attaching this here in hopes this helps. Thank you so much, Lindsay [name]

## 2021-06-02 NOTE — Telephone Encounter (Signed)
Pre-op form completed.

## 2021-06-02 NOTE — Telephone Encounter (Signed)
Pt is calling states she is having back surgery tomorrow. Pt states she saw Dr.Glynn last week for a medication follow up. Pt wanted to know if Dr.Glynn could sign off for her back surgery tomorrow. Please advise

## 2021-06-03 ENCOUNTER — Inpatient Hospital Stay: Admit: 2021-06-03 | Payer: MEDICARE | Primary: Internal Medicine

## 2021-06-03 ENCOUNTER — Inpatient Hospital Stay
Admit: 2021-06-03 | Discharge: 2021-06-09 | Disposition: A | Discharge status: Home Health | Payer: MEDICARE | Attending: Specialist | Admitting: Specialist

## 2021-06-03 DIAGNOSIS — S22000A Wedge compression fracture of unspecified thoracic vertebra, initial encounter for closed fracture: Secondary | ICD-10-CM

## 2021-06-03 DIAGNOSIS — M4135 Thoracogenic scoliosis, thoracolumbar region: Secondary | ICD-10-CM

## 2021-06-03 DIAGNOSIS — S22080A Wedge compression fracture of T11-T12 vertebra, initial encounter for closed fracture: Secondary | ICD-10-CM

## 2021-06-03 LAB — POCT GLUCOSE: POC Glucose: 100 mg/dL (ref 65–117)

## 2021-06-03 LAB — GLUCOSE, POC: Glucose (POC): 100 mg/dL (ref 65–117)

## 2021-06-03 MED ORDER — SODIUM CHLORIDE 0.9% BOLUS IV
0.9 % | Freq: Once | INTRAVENOUS | Status: AC
Start: 2021-06-03 — End: 2021-06-03
  Administered 2021-06-03: 21:00:00 via INTRAVENOUS

## 2021-06-03 MED ORDER — DIAZEPAM 5 MG TAB
5 mg | Freq: Two times a day (BID) | ORAL | Status: AC
Start: 2021-06-03 — End: 2021-06-04
  Administered 2021-06-03 – 2021-06-04 (×2): via ORAL

## 2021-06-03 MED ORDER — ONDANSETRON (PF) 4 MG/2 ML INJECTION
4 mg/2 mL | INTRAMUSCULAR | Status: DC | PRN
Start: 2021-06-03 — End: 2021-06-03
  Administered 2021-06-03: 15:00:00 via INTRAVENOUS

## 2021-06-03 MED ORDER — SODIUM CHLORIDE 0.9 % IV
INTRAVENOUS | Status: DC
Start: 2021-06-03 — End: 2021-06-03
  Administered 2021-06-03: 12:00:00 via INTRAVENOUS

## 2021-06-03 MED ORDER — SODIUM CHLORIDE 0.9 % IV
10 mg/mL | INTRAVENOUS | Status: DC | PRN
Start: 2021-06-03 — End: 2021-06-03
  Administered 2021-06-03 (×4): via INTRAVENOUS

## 2021-06-03 MED ORDER — HYDROMORPHONE (PF) 2 MG/ML IJ SOLN
2 mg/mL | INTRAMUSCULAR | Status: DC | PRN
Start: 2021-06-03 — End: 2021-06-03
  Administered 2021-06-03: 13:00:00 via INTRAVENOUS

## 2021-06-03 MED ORDER — ASPIRIN 81 MG CHEWABLE TAB
81 mg | Freq: Every day | ORAL | Status: AC
Start: 2021-06-03 — End: 2021-06-09
  Administered 2021-06-04 – 2021-06-09 (×6): via ORAL

## 2021-06-03 MED ORDER — PHENOL 1.4 % MUCOSAL AEROSOL SPRAY
1.4 % | Status: AC | PRN
Start: 2021-06-03 — End: 2021-06-09

## 2021-06-03 MED ORDER — ESCITALOPRAM 10 MG TAB
10 mg | Freq: Every evening | ORAL | Status: AC
Start: 2021-06-03 — End: 2021-06-09
  Administered 2021-06-04 – 2021-06-09 (×6): via ORAL

## 2021-06-03 MED ORDER — BENZOCAINE-MENTHOL 15 MG-3.6 MG LOZENGES
Status: AC | PRN
Start: 2021-06-03 — End: 2021-06-09

## 2021-06-03 MED ORDER — ACETAMINOPHEN 500 MG TAB
500 mg | Freq: Four times a day (QID) | ORAL | Status: AC
Start: 2021-06-03 — End: 2021-06-09
  Administered 2021-06-03 – 2021-06-09 (×18): via ORAL

## 2021-06-03 MED ORDER — SODIUM CHLORIDE 0.9 % IJ SYRG
INTRAMUSCULAR | Status: DC | PRN
Start: 2021-06-03 — End: 2021-06-03

## 2021-06-03 MED ORDER — MIDAZOLAM 1 MG/ML IJ SOLN
1 mg/mL | INTRAMUSCULAR | Status: DC | PRN
Start: 2021-06-03 — End: 2021-06-03

## 2021-06-03 MED ORDER — ROPINIROLE 1 MG TAB
1 mg | Freq: Three times a day (TID) | ORAL | Status: AC
Start: 2021-06-03 — End: 2021-06-04
  Administered 2021-06-03 – 2021-06-04 (×3): via ORAL

## 2021-06-03 MED ORDER — DEXAMETHASONE SODIUM PHOSPHATE 4 MG/ML IJ SOLN
4 mg/mL | INTRAMUSCULAR | Status: AC
Start: 2021-06-03 — End: ?

## 2021-06-03 MED ORDER — FENTANYL CITRATE (PF) 50 MCG/ML IJ SOLN
50 mcg/mL | INTRAMUSCULAR | Status: AC | PRN
Start: 2021-06-03 — End: 2021-06-03
  Administered 2021-06-03 (×4): via INTRAVENOUS

## 2021-06-03 MED ORDER — MIDAZOLAM 1 MG/ML IJ SOLN
1 mg/mL | INTRAMUSCULAR | Status: DC | PRN
Start: 2021-06-03 — End: 2021-06-03
  Administered 2021-06-03: 12:00:00 via INTRAVENOUS

## 2021-06-03 MED ORDER — SODIUM CHLORIDE 0.9 % IV
INTRAVENOUS | Status: AC
Start: 2021-06-03 — End: 2021-06-04
  Administered 2021-06-03: 18:00:00 via INTRAVENOUS

## 2021-06-03 MED ORDER — MIDAZOLAM 1 MG/ML IJ SOLN
1 mg/mL | INTRAMUSCULAR | Status: AC
Start: 2021-06-03 — End: ?

## 2021-06-03 MED ORDER — CEFAZOLIN 1 GRAM SOLUTION FOR INJECTION
1 gram | Freq: Three times a day (TID) | INTRAMUSCULAR | Status: AC
Start: 2021-06-03 — End: 2021-06-04
  Administered 2021-06-03 – 2021-06-04 (×2): via INTRAVENOUS

## 2021-06-03 MED ORDER — PROPOFOL 10 MG/ML IV EMUL
10 mg/mL | INTRAVENOUS | Status: DC | PRN
Start: 2021-06-03 — End: 2021-06-03
  Administered 2021-06-03: 13:00:00 via INTRAVENOUS

## 2021-06-03 MED ORDER — ACETAMINOPHEN 325 MG TABLET
325 mg | Freq: Once | ORAL | Status: AC
Start: 2021-06-03 — End: 2021-06-03
  Administered 2021-06-03: 12:00:00 via ORAL

## 2021-06-03 MED ORDER — FENTANYL CITRATE (PF) 50 MCG/ML IJ SOLN
50 mcg/mL | INTRAMUSCULAR | Status: DC | PRN
Start: 2021-06-03 — End: 2021-06-03
  Administered 2021-06-03 (×5): via INTRAVENOUS

## 2021-06-03 MED ORDER — ONDANSETRON (PF) 4 MG/2 ML INJECTION
4 mg/2 mL | INTRAMUSCULAR | Status: AC
Start: 2021-06-03 — End: ?

## 2021-06-03 MED ORDER — PANTOPRAZOLE 20 MG TAB, DELAYED RELEASE
20 mg | Freq: Two times a day (BID) | ORAL | Status: AC
Start: 2021-06-03 — End: 2021-06-09
  Administered 2021-06-03 – 2021-06-09 (×12): via ORAL

## 2021-06-03 MED ORDER — HYDROMORPHONE IN NS 30 MG/30 ML (1 MG/ML) PCA IV SOLN
30 mg/ mL | INTRAVENOUS | Status: AC
Start: 2021-06-03 — End: 2021-06-04
  Administered 2021-06-03: 17:00:00 via INTRAVENOUS

## 2021-06-03 MED ORDER — CYCLOBENZAPRINE 10 MG TAB
10 mg | Freq: Two times a day (BID) | ORAL | Status: AC | PRN
Start: 2021-06-03 — End: 2021-06-09

## 2021-06-03 MED ORDER — SODIUM CHLORIDE 0.9 % IRRIGATION SOLN
0.9 % | Status: DC | PRN
Start: 2021-06-03 — End: 2021-06-03
  Administered 2021-06-03: 14:00:00

## 2021-06-03 MED ORDER — FENTANYL CITRATE (PF) 50 MCG/ML IJ SOLN
50 mcg/mL | INTRAMUSCULAR | Status: AC
Start: 2021-06-03 — End: ?

## 2021-06-03 MED ORDER — SUCCINYLCHOLINE CHLORIDE 20 MG/ML INJECTION
20 mg/mL | INTRAMUSCULAR | Status: DC | PRN
Start: 2021-06-03 — End: 2021-06-03
  Administered 2021-06-03: 13:00:00 via INTRAVENOUS

## 2021-06-03 MED ORDER — MORPHINE 2 MG/ML INJECTION
2 mg/mL | INTRAMUSCULAR | Status: DC | PRN
Start: 2021-06-03 — End: 2021-06-03

## 2021-06-03 MED ORDER — LIDOCAINE (PF) 10 MG/ML (1 %) IJ SOLN
10 mg/mL (1 %) | INTRAMUSCULAR | Status: DC | PRN
Start: 2021-06-03 — End: 2021-06-03
  Administered 2021-06-03: 12:00:00 via SUBCUTANEOUS

## 2021-06-03 MED ORDER — LIDOCAINE (PF) 20 MG/ML (2 %) IJ SOLN
20 mg/mL (2 %) | INTRAMUSCULAR | Status: DC | PRN
Start: 2021-06-03 — End: 2021-06-03
  Administered 2021-06-03: 13:00:00 via INTRAVENOUS

## 2021-06-03 MED ORDER — SURGICAL PAIN SOLUTION (PYXIS)
Freq: Once | INTRAMUSCULAR | Status: AC
Start: 2021-06-03 — End: 2021-06-03
  Administered 2021-06-03 (×2)

## 2021-06-03 MED ORDER — SCOPOLAMINE (1.3-1.5) MG 72 HR TRANSDERM PATCH
1 mg over 3 days | TRANSDERMAL | Status: DC | PRN
Start: 2021-06-03 — End: 2021-06-03
  Administered 2021-06-03: 12:00:00 via TRANSDERMAL

## 2021-06-03 MED ORDER — ROPIVACAINE (PF) 5 MG/ML (0.5 %) INJECTION
5 mg/mL (0. %) | INTRAMUSCULAR | Status: DC | PRN
Start: 2021-06-03 — End: 2021-06-03

## 2021-06-03 MED ORDER — OXYCODONE 5 MG TAB
5 mg | ORAL | Status: AC | PRN
Start: 2021-06-03 — End: 2021-06-09
  Administered 2021-06-04 – 2021-06-09 (×20): via ORAL

## 2021-06-03 MED ORDER — PHENYLEPHRINE IN 0.9 % SODIUM CL (40 MCG/ML) IV SYRINGE
0.4 mg/10 mL (40 mcg/mL) | INTRAVENOUS | Status: AC
Start: 2021-06-03 — End: ?

## 2021-06-03 MED ORDER — BUPROPION SR 150 MG TAB
150 mg | Freq: Every day | ORAL | Status: AC
Start: 2021-06-03 — End: 2021-06-09
  Administered 2021-06-04 – 2021-06-09 (×6): via ORAL

## 2021-06-03 MED ORDER — VANCOMYCIN 1,000 MG IV SOLR
1000 mg | INTRAVENOUS | Status: DC | PRN
Start: 2021-06-03 — End: 2021-06-03
  Administered 2021-06-03: 15:00:00 via TOPICAL

## 2021-06-03 MED ORDER — GABAPENTIN ENACARBIL ER 600 MG TABLET,EXTENDED RELEASE
600 mg | Freq: Two times a day (BID) | ORAL | Status: AC
Start: 2021-06-03 — End: 2021-06-09
  Administered 2021-06-04 – 2021-06-09 (×11): via ORAL

## 2021-06-03 MED ORDER — DIPHENHYDRAMINE HCL 50 MG/ML IJ SOLN
50 mg/mL | Freq: Four times a day (QID) | INTRAMUSCULAR | Status: AC | PRN
Start: 2021-06-03 — End: 2021-06-04

## 2021-06-03 MED ORDER — HYDROMORPHONE 1 MG/ML INJECTION SOLUTION
1 mg/mL | INTRAMUSCULAR | Status: AC | PRN
Start: 2021-06-03 — End: 2021-06-04

## 2021-06-03 MED ORDER — ONDANSETRON (PF) 4 MG/2 ML INJECTION
4 mg/2 mL | INTRAMUSCULAR | Status: DC | PRN
Start: 2021-06-03 — End: 2021-06-03

## 2021-06-03 MED ORDER — HYDROMORPHONE 2 MG/ML INJECTION SOLUTION
2 mg/mL | INTRAMUSCULAR | Status: AC
Start: 2021-06-03 — End: ?

## 2021-06-03 MED ORDER — ROCURONIUM 10 MG/ML IV
10 mg/mL | INTRAVENOUS | Status: DC | PRN
Start: 2021-06-03 — End: 2021-06-03
  Administered 2021-06-03: 13:00:00 via INTRAVENOUS

## 2021-06-03 MED ORDER — SODIUM CHLORIDE 0.9 % IJ SYRG
Freq: Three times a day (TID) | INTRAMUSCULAR | Status: DC
Start: 2021-06-03 — End: 2021-06-03
  Administered 2021-06-03: 19:00:00 via INTRAVENOUS

## 2021-06-03 MED ORDER — DEXAMETHASONE SODIUM PHOSPHATE 4 MG/ML IJ SOLN
4 mg/mL | INTRAMUSCULAR | Status: DC | PRN
Start: 2021-06-03 — End: 2021-06-03
  Administered 2021-06-03: 14:00:00 via INTRAVENOUS

## 2021-06-03 MED ORDER — OXYCODONE 5 MG TAB
5 mg | ORAL | Status: AC | PRN
Start: 2021-06-03 — End: 2021-06-09

## 2021-06-03 MED ORDER — ONDANSETRON (PF) 4 MG/2 ML INJECTION
4 mg/2 mL | INTRAMUSCULAR | Status: AC | PRN
Start: 2021-06-03 — End: 2021-06-04

## 2021-06-03 MED ORDER — PROPOFOL 10 MG/ML IV EMUL
10 mg/mL | INTRAVENOUS | Status: AC
Start: 2021-06-03 — End: ?

## 2021-06-03 MED ORDER — POLYETHYLENE GLYCOL 3350 17 GRAM (100 %) ORAL POWDER PACKET
17 gram | Freq: Every day | ORAL | Status: AC
Start: 2021-06-03 — End: 2021-06-09
  Administered 2021-06-04 – 2021-06-09 (×6): via ORAL

## 2021-06-03 MED ORDER — PHENYLEPHRINE 10 MG/ML INJECTION
10 mg/mL | INTRAMUSCULAR | Status: AC
Start: 2021-06-03 — End: ?

## 2021-06-03 MED ORDER — LACTATED RINGERS IV
INTRAVENOUS | Status: DC
Start: 2021-06-03 — End: 2021-06-03
  Administered 2021-06-03: 16:00:00 via INTRAVENOUS

## 2021-06-03 MED ORDER — ROCURONIUM 10 MG/ML IV
10 mg/mL | INTRAVENOUS | Status: AC
Start: 2021-06-03 — End: ?

## 2021-06-03 MED ORDER — SUCCINYLCHOLINE CHLORIDE 200 MG/10 ML (20 MG/ML) INTRAVENOUS SYRINGE
200 mg/10 mL (20 mg/mL) | INTRAVENOUS | Status: AC
Start: 2021-06-03 — End: ?

## 2021-06-03 MED ORDER — DIPHENHYDRAMINE HCL 50 MG/ML IJ SOLN
50 mg/mL | INTRAMUSCULAR | Status: DC | PRN
Start: 2021-06-03 — End: 2021-06-03

## 2021-06-03 MED ORDER — FENTANYL CITRATE (PF) 50 MCG/ML IJ SOLN
50 mcg/mL | INTRAMUSCULAR | Status: DC | PRN
Start: 2021-06-03 — End: 2021-06-03

## 2021-06-03 MED ORDER — CHOLECALCIFEROL (VITAMIN D3) 1,000 UNIT (25 MCG) TAB
Freq: Every day | ORAL | Status: AC
Start: 2021-06-03 — End: 2021-06-09
  Administered 2021-06-04 – 2021-06-09 (×6): via ORAL

## 2021-06-03 MED ORDER — HEPARIN (PORCINE) 30,000 UNIT/1,000 ML IN 0.9 % SOD CHLORIDE IV
30000 unit/1,000 mL | INTRAVENOUS | Status: AC
Start: 2021-06-03 — End: ?

## 2021-06-03 MED ORDER — THROMBIN (BOVINE) 5,000 UNIT TOPICAL SOLUTION
5000 unit | CUTANEOUS | Status: DC | PRN
Start: 2021-06-03 — End: 2021-06-03
  Administered 2021-06-03: 14:00:00 via TOPICAL

## 2021-06-03 MED ORDER — PHENYLEPHRINE IN 0.9 % SODIUM CL (40 MCG/ML) IV SYRINGE
0.4 mg/10 mL (40 mcg/mL) | INTRAVENOUS | Status: DC | PRN
Start: 2021-06-03 — End: 2021-06-03
  Administered 2021-06-03 (×6): via INTRAVENOUS

## 2021-06-03 MED ORDER — SODIUM CHLORIDE 0.9 % IJ SYRG
Freq: Three times a day (TID) | INTRAMUSCULAR | Status: DC
Start: 2021-06-03 — End: 2021-06-03
  Administered 2021-06-03: 12:00:00 via INTRAVENOUS

## 2021-06-03 MED ORDER — SODIUM CHLORIDE 0.9 % IJ SYRG
Freq: Three times a day (TID) | INTRAMUSCULAR | Status: AC
Start: 2021-06-03 — End: 2021-06-09
  Administered 2021-06-03 – 2021-06-09 (×19): via INTRAVENOUS

## 2021-06-03 MED ORDER — BISACODYL 10 MG RECTAL SUPPOSITORY
10 mg | Freq: Every day | RECTAL | Status: AC | PRN
Start: 2021-06-03 — End: 2021-06-09

## 2021-06-03 MED ORDER — VANCOMYCIN 1,000 MG IV SOLR
1000 mg | INTRAVENOUS | Status: AC
Start: 2021-06-03 — End: ?

## 2021-06-03 MED ORDER — EPHEDRINE (PF) 50 MG/5 ML (10 MG/ML) IN NS IV SYRINGE
50 mg/5 mL (10 mg/mL) | INTRAVENOUS | Status: DC | PRN
Start: 2021-06-03 — End: 2021-06-03
  Administered 2021-06-03 (×2): via INTRAVENOUS

## 2021-06-03 MED ORDER — SODIUM CHLORIDE 0.9 % IJ SYRG
INTRAMUSCULAR | Status: AC | PRN
Start: 2021-06-03 — End: 2021-06-09

## 2021-06-03 MED ORDER — THROMBIN (BOVINE) 5,000 UNIT TOPICAL SOLUTION
5000 unit | CUTANEOUS | Status: AC
Start: 2021-06-03 — End: ?

## 2021-06-03 MED ORDER — EPHEDRINE SULFATE 50 MG/ML INTRAVENOUS SOLUTION
50 mg/mL | INTRAVENOUS | Status: AC
Start: 2021-06-03 — End: ?

## 2021-06-03 MED ORDER — SCOPOLAMINE (1.3-1.5) MG 72 HR TRANSDERM PATCH
1 mg over 3 days | TRANSDERMAL | Status: AC
Start: 2021-06-03 — End: ?

## 2021-06-03 MED ORDER — LACTATED RINGERS IV
INTRAVENOUS | Status: DC
Start: 2021-06-03 — End: 2021-06-03
  Administered 2021-06-03 (×2): via INTRAVENOUS

## 2021-06-03 MED ORDER — NALOXONE 0.4 MG/ML INJECTION
0.4 mg/mL | INTRAMUSCULAR | Status: AC | PRN
Start: 2021-06-03 — End: 2021-06-09

## 2021-06-03 MED ORDER — HYDROMORPHONE 1 MG/ML INJECTION SOLUTION
1 mg/mL | INTRAMUSCULAR | Status: DC | PRN
Start: 2021-06-03 — End: 2021-06-03

## 2021-06-03 MED ORDER — SENNOSIDES-DOCUSATE SODIUM 8.6 MG-50 MG TAB
Freq: Two times a day (BID) | ORAL | Status: AC
Start: 2021-06-03 — End: 2021-06-09
  Administered 2021-06-03 – 2021-06-09 (×12): via ORAL

## 2021-06-03 MED ORDER — SURGICAL PAIN SOLUTION (PYXIS)
INTRAMUSCULAR | Status: AC
Start: 2021-06-03 — End: ?

## 2021-06-03 MED ORDER — CEFAZOLIN 1 GRAM SOLUTION FOR INJECTION
1 gram | Freq: Once | INTRAMUSCULAR | Status: AC
Start: 2021-06-03 — End: 2021-06-03
  Administered 2021-06-03: 13:00:00 via INTRAVENOUS

## 2021-06-03 MED ORDER — PROPOFOL 10 MG/ML IV EMUL
10 mg/mL | INTRAVENOUS | Status: DC | PRN
Start: 2021-06-03 — End: 2021-06-03
  Administered 2021-06-03 (×3): via INTRAVENOUS

## 2021-06-03 MED ORDER — LIDOCAINE (PF) 20 MG/ML (2 %) IJ SOLN
20 mg/mL (2 %) | INTRAMUSCULAR | Status: AC
Start: 2021-06-03 — End: ?

## 2021-06-03 MED FILL — LIDOCAINE (PF) 20 MG/ML (2 %) IJ SOLN: 20 mg/mL (2 %) | INTRAMUSCULAR | Qty: 5

## 2021-06-03 MED FILL — CEFAZOLIN 1 GRAM SOLUTION FOR INJECTION: 1 gram | INTRAMUSCULAR | Qty: 2000

## 2021-06-03 MED FILL — SUCCINYLCHOLINE CHLORIDE 200 MG/10 ML (20 MG/ML) INTRAVENOUS SYRINGE: 200 mg/10 mL (20 mg/mL) | INTRAVENOUS | Qty: 10

## 2021-06-03 MED FILL — PANTOPRAZOLE 20 MG TAB, DELAYED RELEASE: 20 mg | ORAL | Qty: 1

## 2021-06-03 MED FILL — DEXAMETHASONE SODIUM PHOSPHATE 4 MG/ML IJ SOLN: 4 mg/mL | INTRAMUSCULAR | Qty: 2

## 2021-06-03 MED FILL — PHENYLEPHRINE IN 0.9 % SODIUM CL (40 MCG/ML) IV SYRINGE: 0.4 mg/10 mL (40 mcg/mL) | INTRAVENOUS | Qty: 10

## 2021-06-03 MED FILL — LACTATED RINGERS IV: INTRAVENOUS | Qty: 1000

## 2021-06-03 MED FILL — TRANSDERM-SCOP 1 MG OVER 3 DAYS TRANSDERMAL PATCH: 1 mg over 3 days | TRANSDERMAL | Qty: 1

## 2021-06-03 MED FILL — HEPARIN (PORCINE) 30,000 UNIT/1,000 ML IN 0.9 % SOD CHLORIDE IV: 30000 unit/1,000 mL | INTRAVENOUS | Qty: 1000

## 2021-06-03 MED FILL — PHENYLEPHRINE 10 MG/ML INJECTION: 10 mg/mL | INTRAMUSCULAR | Qty: 1

## 2021-06-03 MED FILL — CHLORASEPTIC THROAT SPRAY 1.4 % AEROSOL: 1.4 % | Qty: 20

## 2021-06-03 MED FILL — ACETAMINOPHEN 500 MG TAB: 500 mg | ORAL | Qty: 2

## 2021-06-03 MED FILL — STIMULANT LAXATIVE PLUS 8.6 MG-50 MG TABLET: ORAL | Qty: 1

## 2021-06-03 MED FILL — HYDROMORPHONE (PF) 10 MG/ML INJECTION: 10 mg/mL | INTRAMUSCULAR | Qty: 30

## 2021-06-03 MED FILL — EPHEDRINE SULFATE 50 MG/ML INTRAVENOUS SOLUTION: 50 mg/mL | INTRAVENOUS | Qty: 1

## 2021-06-03 MED FILL — ROPINIROLE 1 MG TAB: 1 mg | ORAL | Qty: 1

## 2021-06-03 MED FILL — THROMBIN-JMI 5,000 UNIT TOPICAL SOLUTION: 5000 unit | CUTANEOUS | Qty: 1

## 2021-06-03 MED FILL — ROCURONIUM 10 MG/ML IV: 10 mg/mL | INTRAVENOUS | Qty: 5

## 2021-06-03 MED FILL — FENTANYL CITRATE (PF) 50 MCG/ML IJ SOLN: 50 mcg/mL | INTRAMUSCULAR | Qty: 1

## 2021-06-03 MED FILL — HYDROMORPHONE 2 MG/ML INJECTION SOLUTION: 2 mg/mL | INTRAMUSCULAR | Qty: 1

## 2021-06-03 MED FILL — NORMAL SALINE FLUSH 0.9 % INJECTION SYRINGE: INTRAMUSCULAR | Qty: 40

## 2021-06-03 MED FILL — SURGICAL PAIN SOLUTION (PYXIS): INTRAMUSCULAR | Qty: 100

## 2021-06-03 MED FILL — SODIUM CHLORIDE 0.9 % IV: INTRAVENOUS | Qty: 500

## 2021-06-03 MED FILL — SODIUM CHLORIDE 0.9 % IV: INTRAVENOUS | Qty: 1000

## 2021-06-03 MED FILL — MIDAZOLAM 1 MG/ML IJ SOLN: 1 mg/mL | INTRAMUSCULAR | Qty: 2

## 2021-06-03 MED FILL — VANCOMYCIN 1,000 MG IV SOLR: 1000 mg | INTRAVENOUS | Qty: 1000

## 2021-06-03 MED FILL — DIPRIVAN 10 MG/ML INTRAVENOUS EMULSION: 10 mg/mL | INTRAVENOUS | Qty: 20

## 2021-06-03 MED FILL — HYDROMORPHONE (PF) 1 MG/ML IJ SOLN: 1 mg/mL | INTRAMUSCULAR | Qty: 1

## 2021-06-03 MED FILL — FENTANYL CITRATE (PF) 50 MCG/ML IJ SOLN: 50 mcg/mL | INTRAMUSCULAR | Qty: 5

## 2021-06-03 MED FILL — PROPOFOL 10 MG/ML IV EMUL: 10 mg/mL | INTRAVENOUS | Qty: 100

## 2021-06-03 MED FILL — DIAZEPAM 5 MG TAB: 5 mg | ORAL | Qty: 1

## 2021-06-03 MED FILL — ACETAMINOPHEN 325 MG TABLET: 325 mg | ORAL | Qty: 2

## 2021-06-03 MED FILL — ONDANSETRON (PF) 4 MG/2 ML INJECTION: 4 mg/2 mL | INTRAMUSCULAR | Qty: 2

## 2021-06-03 NOTE — Op Note (Signed)
Brief Postoperative Note    Patient: Lindsay [name]  Date of Birth: Feb 16, 1956  MRN: [id number]    Date of Procedure: 06/03/2021     Pre-Op Diagnosis: Back pain, unspecified back location, unspecified back pain laterality, unspecified chronicity [M54.9]    Post-Op Diagnosis: Same as preoperative diagnosis.      Procedure(s):  T10 - L2 POSTERIOR SPINAL FUSION WITH T11 - T12 THORACIC LAMINECTOMY (MAZOR/O-ARM) WITH OSTEOTOMIES T11, T12, L1    Surgeon(s):  Kem Parkinson, MD    Surgical Assistant: Physician Assistant: Frazier Richards, PA    Anesthesia: General     Estimated Blood Loss (mL): 125    Complications: None    Specimens: * No specimens in log *     Implants:   Implant Name Type Inv. Item Serial No. Manufacturer Lot No. LRB No. Used Action   GRAFT BNE SUB L CANC FRZN MORSELIZED W/ VIABLE CELL TRINITY - [id number]  GRAFT BNE SUB L CANC FRZN MORSELIZED W/ VIABLE CELL TRINITY [id number] [employer] N/A N/A 1 Implanted   GRAFT BNE SUB L CANC FRZN MORSELIZED W/ VIABLE CELL TRINITY - [id number]  GRAFT BNE SUB L CANC FRZN MORSELIZED W/ VIABLE CELL TRINITY [id number] [employer] N/A N/A 1 Implanted   GRAFT BNE SUB L CANC FRZN MORSELIZED W/ VIABLE CELL TRINITY - [id number]  GRAFT BNE SUB L CANC FRZN MORSELIZED W/ VIABLE CELL TRINITY [id number] [employer] N/A N/A 1 Implanted   GRAFT BNE SUB L CANC FRZN MORSELIZED W/ VIABLE CELL TRINITY - [id number]  GRAFT BNE SUB L CANC FRZN MORSELIZED W/ VIABLE CELL TRINITY [id number] [employer] N/A N/A 1 Implanted   SCREW SPNL L40MM DIA5. POST THORACOLUMBOSACRAL CO CHROM - SN/A  SCREW SPNL L40MM DIA5. POST THORACOLUMBOSACRAL CO CHROM N/A [employer] SOFAMOR DANEK[employer]WD N/A N/A 8 Implanted   SET SCR SPNL L6MM DIA5. TI BRK OFF LOK W/ DETACH [facility] - SN/A  SET SCR SPNL L6MM DIA5. TI BRK OFF LOK W/  DETACH [facility] N/A [employer] SOFAMOR DANEK_WD N/A N/A 8 Implanted   ROD SPNL L500MM DIA5. THORACOLUMBOSACRAL TI SMOOTH STR - SN/A  ROD SPNL L500MM DIA5. THORACOLUMBOSACRAL TI SMOOTH STR N/A [employer] SOFAMOR DANEK_WD N/A N/A 1 Implanted   signafuse   N/A [employer] [id number] N/A 1 Implanted       Drains:   Hemovac Right;Lower Back (Active)       Findings: Stenosis     Electronically Signed by Frazier Richards, PA on 06/03/2021 at 10:27 AM

## 2021-06-03 NOTE — Anesthesia Post-Procedure Evaluation (Signed)
Formatting of this note is different from the original.    Procedure(s):  T10 - L2 POSTERIOR SPINAL FUSION WITH T11 - T12 THORACIC LAMINECTOMY (MAZOR/O-ARM) WITH OSTEOTOMIES T11, T12, L1.    general    Anesthesia Post Evaluation    Patient location during evaluation: PACU  Note status: Adequate.  Level of consciousness: responsive to verbal stimuli and sleepy but conscious  Pain management: satisfactory to patient  Airway patency: patent  Anesthetic complications: no  Cardiovascular status: acceptable  Respiratory status: acceptable  Hydration status: acceptable  Comments: +Post-Anesthesia Evaluation and Assessment    Patient: Lindsay [name] MRN: [id number]  SSN: [id number]   Date of Birth: 1956/04/10  Age: 65 y.o.  Sex: female       Cardiovascular Function/Vital Signs    BP 100/61   Pulse 65   Temp 36.4 C (97.5 F)   Resp 17   Ht 5' 2.5" (1.588 m)   Wt 67.3 kg (148 lb 5.9 oz)   SpO2 93%   BMI 26.70 kg/m     Patient is status post Procedure(s):  T10 - L2 POSTERIOR SPINAL FUSION WITH T11 - T12 THORACIC LAMINECTOMY (MAZOR/O-ARM) WITH OSTEOTOMIES T11, T12, L1.    Nausea/Vomiting: Controlled.    Postoperative hydration reviewed and adequate.    Pain:  Pain Scale 1: Numeric (0 - 10) (06/03/21 1300)  Pain Intensity 1: 4 (06/03/21 1300)   Managed.    Neurological Status:   Neuro (WDL): Within Defined Limits (06/03/21 0623)   At baseline.    Mental Status and Level of Consciousness: Arousable.    Pulmonary Status:   O2 Device: Nasal cannula (06/03/21 1300)   Adequate oxygenation and airway patent.    Complications related to anesthesia: None    Post-anesthesia assessment completed. No concerns.    I have evaluated the patient and the patient is stable and ready to be discharged from PACU .    Signed By: Dalia Heading, MD    06/03/2021    INITIAL Post-op Vital signs:   Vitals Value Taken Time   BP 101/59 06/03/21 1345   Temp 36.4 C (97.6 F) 06/03/21 1300   Pulse 65 06/03/21 1356   Resp 9 06/03/21 1356   SpO2 98 %  06/03/21 1356   Vitals shown include unvalidated device data.  Electronically signed by Dalia Heading, MD at 06/03/2021  5:11 PM EST

## 2021-06-03 NOTE — Consults (Signed)
Consults by Gwenette Greet, NP at 06/03/21 1551                Author: Gwenette Greet, NP  Service: Hospitalist  Author Type: Nurse Practitioner       Filed: 06/03/21 1627  Date of Service: 06/03/21 1551  Status: Attested           Editor: Gwenette Greet, NP (Nurse Practitioner)  Cosigner: Rebeca Allegra, MD at 06/12/21 [phone]            Consult Orders        1. IP CONSULT TO HOSPITALIST [[id number]] ordered by Marylene Land, PA-C at 06/02/21 2017                         Attestation signed by Rebeca Allegra, MD at 06/12/21 [phone]          I evaluated this patient, key findings were discussed with the Advanced Practice Provider   Key components of clinical history, HPI, social history, allergies, past medical history, past surgical history, physical exam and medical decision making were discussed   On exam   CVS: S1S2 heard   Skin: warm    key components of medical care were performed.   Patient hospitalized for Back pain, unspecified back location, unspecified back pain laterality, unspecified chronicity [M54.9]   Compression fracture of body of thoracic vertebra (HCC) [S22.000A]   Care plan discussed with APP, key components of MDM performed   Please see APP note for details. Agree with APP assessment and Plan       Rebeca Allegra MD                                  [address] [facility]      Hospitalist Consult   Primary Care Provider: Clarene Critchley, MD   Consult requested by: Marylene Land        Subjective:        Lindsay [name] is a 65 y.o. 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 Ortho noted "recommended T10-L2 posterior  spinal fusion secondary to chronic T12 compression fracture with nonunion. Patient reported symptoms began after falling off stool in June, 2021.   She had a subsequent kyphoplasty that did not  substantially relieve her symptoms, recommended that given her persistent pain, combined with kyphosis  throughout the T11 and T12 vertebra that she was a good candidate for stabilization of her fracture. She reported continued mid back pain with also low back pain that radiates to the right lower  abdomen. She denied leg pain, no numbness, tingling, or weakness in the legs, no bowel or bladder changes". Onset of symptoms was gradual with gradually worsening course since that time.    Patient is currently admitted to the Orthopedic Surgery service and is postop day 1 s/p T10-L2 posterior spinal fusion w/ T11-12 thoracic laminectomy (Mazor/O-Arm) w/ Osteotomies T11, T12, L1 11/30.      We are asked to see the patient in consult to assist in managing her chronic health illnesses.      Review of patient's chart did not reveal any intraoperative or postoperative complications. At this time the patient has no complaints.       She denies any headaches  or dizziness. Denies any cough, chest pain, shortness of breath. Denies fever or chills. Denies nausea, vomiting, diarrhea, constipation.       Review of Systems:      A comprehensive review of systems was negative except for that written in the History of Present Illness.         Past Medical History:        Diagnosis  Date         ?  Abnormal brain scan       ?  Anxiety disorder       ?  Arthritis       ?  Chronic pain       ?  Depression       ?  DVT (deep venous thrombosis) (HCC)       ?  Falls frequently       ?  Fracture of left ankle       ?  HeadaZOXWRUEA(540.9 2011     ?  Ill-defined condition  09/15/2015          FX R HAND         ?  Neurocutaneous syndrome (HCC)       ?  Other and unspecified hyperlipidemia  12/27/2012     ?  PE (pulmonary embolism)  12/2012     ?  RLS (restless legs syndrome)  03/16/2016          Dr Scarlett Presto         ?  TIA (transient ischemic attack)  03/2016          Right facial droop, numbness in lip         ?  Torn rotator cuff             right           Past Surgical History:         Procedure  Laterality  Date          ?  HX APPENDECTOMY         ?  HX CESAREAN SECTION              x4          ?  HX GI              COLONOSCOPY          ?  HX KNEE REPLACEMENT  Bilateral  10/29/2015     ?  HX ORTHOPAEDIC    08/2004          rotater cuff r. shoulder          ?  HX ORTHOPAEDIC              left ankle fracture treated with air cast          ?  HX ORTHOPAEDIC    12/2011          left shoulder fxt and dislocation          ?  HX OTHER SURGICAL    2010          left foot torn tendon          ?  HX TUBAL LIGATION         ?  IMPLANT BREAST SILICONE/EQ    2002          Bilateral breast implants          ?  IR KYPHOPLASTY THORACIC  12/12/2019     ?  PR BREAST SURGERY PROCEDURE UNLISTED    2002          AUGMENTATION          Prior to Admission medications             Medication  Sig  Start Date  End Date  Taking?  Authorizing Provider            buPROPion XL (WELLBUTRIN XL) 150 mg tablet  Take 1 Tablet by mouth every morning.  05/25/21    Yes  Clarene Critchley, MD     escitalopram oxalate (LEXAPRO) 10 mg tablet  Take 1 Tablet by mouth nightly.   Patient taking differently: Take 20 mg by mouth nightly.  05/25/21    Yes  Clarene Critchley, MD            diazePAM (VALIUM) 5 mg tablet  TAKE 1 TABLET BY MOUTH IN THE MORNING & 2 TABLET IN THE EVENING DAILY  04/10/21    Yes  Clarene Critchley, MD            rOPINIRole (REQUIP) 1 mg tablet  TAKE 1 TABLET AT 1-2PM AND TAKE 3 TABLETS BY MOUTH 1 TO 3 HOURS BEFORE BEDTIME EVERYDAY  03/18/21    Yes  Jodi Mourning, MD     gabapentin enacarbil (Horizant) 600 mg TbER  TAKE 1 TABLET BY MOUTH TWICE A DAY  03/12/21    Yes  Jodi Mourning, MD     Vitamin D3 125 mcg (5,000 unit) tab tablet  TAKE 1 TABLET BY MOUTH DAILY  01/09/20    Yes  Provider, Historical     omeprazole (PRILOSEC OTC) 20 mg tablet  Take 20 mg by mouth every evening.      Yes  Provider, Historical     aspirin 81 mg chewable tablet  Take 1 Tab by mouth daily.   03/27/16    Yes  Karsten Ro, MD            ibuprofen (MOTRIN) 200 mg tablet  Take 200 mg by mouth as needed for Pain.   Patient not taking: Reported on 06/03/2021        Provider, Historical        No Known Allergies      Family History         Problem  Relation  Age of Onset          ?  Heart Disease  Mother       ?  Heart Disease  Father       ?  Cancer  Father                thyroid cancer          ?  Arthritis-rheumatoid  Sister       ?  Other  Brother                COVID          ?  No Known Problems  Daughter       ?  No Known Problems  Daughter       ?  No Known Problems  Daughter       ?  No Known Problems  Son            ?  Anesth Problems  Neg Hx  SOCIAL HISTORY:   Patient resides at Home.   Patient ambulates with per PT/OT recommendations.    Smoking history: never   Alcohol history: occasional.        Objective:           Physical Exam:       Constitutional:   No acute distress, cooperative, pleasant      ENT:   Oral mucosa moist.      Resp:   CTA bilaterally. No wheezing/rhonchi/rales. No accessory muscle use.     CV:   Regular rhythm, normal rate, S1,S2.      GI/GU:   Soft, non distended, non tender, no guarding, BS present. Foley patent/clr-yellow urine      Musculoskeletal:   No edema, warm.         Neurologic:   Moves all extremities. AAOx3.   Skin:  w/d.   Psych:  Good insight, Not anxious nor agitated.           Data Review:    All diagnostic labs and studies have been reviewed.              Assessment:     Lindsay [name] is a 65 y.o. 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 We are asked to see the patient in consult to assist in managing chronic illnesses.      Active Problems:     Compression fracture of body of thoracic vertebra (HCC) (06/03/2021)              Plan:     Closed Wedge Compression Fracture of T12 Vertebra with nonunion/LBP/Chronic Pain:   -Ortho Sx: s/p T10-L2 posterior spinal fusion w/  T11-12 thoracic laminectomy (Mazor/O-Arm) w/ Osteotomies T11, T12, L1 11/30   -pain control (patient on PCA), bowel regimen   -PT/OT   -Primary Team managing      Post- op Hypotension:   -monitor BP/HR   -tele   - NS bolus x1   -IVF support continued      GERD: continue protonix   HLD: lipid panel, not on statin.    Anxiety/Depression: continue home regimen.       DVTppx: SCDs   GIppx: Protonix   Code Status: Full Code   Diet: Cardiac   Activity: OOB to chair TID and PRN, PT/OT   Discharge: from home, likely IPR/SNF   Ambulates: PT/OT eval            Signed By:  Gwenette Greet, NP           Date of Service:  06/03/2021

## 2021-06-03 NOTE — Anesthesia Pre-Procedure Evaluation (Signed)
Formatting of this note is different from the original.  Relevant Problems   NEUROLOGY   (+) Depression     Anesthetic History   No history of anesthetic complications        Review of Systems / Medical History  Patient summary reviewed, nursing notes reviewed and pertinent labs reviewed    Pulmonary  Within defined limits     Neuro/Psych   Within defined limits    CVA  Psychiatric history     Cardiovascular  Within defined limits    Exercise tolerance: >4 METS  Comments: PE   GI/Hepatic/Renal  Within defined limits      Endo/Other  Within defined limits    Arthritis     Other Findings       Physical Exam    Airway  Mallampati: II  TM Distance: > 6 cm  Neck ROM: normal range of motion   Mouth opening: Normal     Cardiovascular  Regular rate and rhythm,  S1 and S2 normal,  no murmur, click, rub, or gallop     Dental  No notable dental hx      Pulmonary  Breath sounds clear to auscultation     Abdominal  GI exam deferred     Other Findings       Anesthetic Plan    ASA: 3  Anesthesia type: general    Induction: Intravenous  Anesthetic plan and risks discussed with: Patient        Electronically signed by Dalia Heading, MD at 06/03/2021  7:08 AM EST

## 2021-06-03 NOTE — Interval H&P Note (Signed)
 TRANSFER - OUT REPORT:    Verbal report given to Malarie (name) on Lindsay [name]  being transferred to 548(unit) for routine post - op       Report consisted of patient's Situation, Background, Assessment and   Recommendations(SBAR).     Time Pre op antibiotic [phone]  Anesthesia Stop time: 1049    Information from the following report(s) SBAR, OR Summary, Intake/Output, MAR, and Accordion was reviewed with the receiving nurse.    Opportunity for questions and clarification was provided.     Is the patient on 02? YES       L/Min 2       Other     Is the patient on a monitor? NO    Is the nurse transporting with the patient? NO    Surgical Waiting Area notified of patient's transfer from PACU? YES      The following personal items collected during your admission accompanied patient upon transfer:   Dental Appliance: Dental Appliances: None  Vision:    Hearing Aid:    Jewelry:    Clothing: Clothing:  (clothing bag to pacu)  Other Valuables:    Valuables sent to safe:      Clothes to floor with pt

## 2021-06-03 NOTE — Progress Notes (Signed)
Occupational Therapy  06/03/21    Orders received, chart review completed. Note patient POD #0 s/p thoracolumbar fusion & thoracic laminectomy, not a fast track candidate. OT will follow up tomorrow for evaluation. Recommend OOB to chair three times a day for meals, self-completion of ADLs as able and medically stable.     Thank you,   Blinda Leatherwood, OTD, OTR/L

## 2021-06-03 NOTE — Anesthesia Post-Procedure Evaluation (Signed)
Procedure(s):  T10 - L2 POSTERIOR SPINAL FUSION WITH T11 - T12 THORACIC LAMINECTOMY (MAZOR/O-ARM) WITH OSTEOTOMIES T11, T12, L1.    general    Anesthesia Post Evaluation        Patient location during evaluation: PACU  Note status: Adequate.  Level of consciousness: responsive to verbal stimuli and sleepy but conscious  Pain management: satisfactory to patient  Airway patency: patent  Anesthetic complications: no  Cardiovascular status: acceptable  Respiratory status: acceptable  Hydration status: acceptable  Comments: +Post-Anesthesia Evaluation and Assessment    Patient: Lindsay [name] MRN: [id number]  SSN: [id number]   Date of Birth: December 11, 1955  Age: 65 y.o.  Sex: female          Cardiovascular Function/Vital Signs    BP 100/61    Pulse 65    Temp 36.4 ??C (97.5 ??F)    Resp 17    Ht 5' 2.5" (1.588 m)    Wt 67.3 kg (148 lb 5.9 oz)    SpO2 93%    BMI 26.70 kg/m??     Patient is status post Procedure(s):  T10 - L2 POSTERIOR SPINAL FUSION WITH T11 - T12 THORACIC LAMINECTOMY (MAZOR/O-ARM) WITH OSTEOTOMIES T11, T12, L1.    Nausea/Vomiting: Controlled.    Postoperative hydration reviewed and adequate.    Pain:  Pain Scale 1: Numeric (0 - 10) (06/03/21 1300)  Pain Intensity 1: 4 (06/03/21 1300)   Managed.    Neurological Status:   Neuro (WDL): Within Defined Limits (06/03/21 0623)   At baseline.    Mental Status and Level of Consciousness: Arousable.    Pulmonary Status:   O2 Device: Nasal cannula (06/03/21 1300)   Adequate oxygenation and airway patent.    Complications related to anesthesia: None    Post-anesthesia assessment completed. No concerns.    I have evaluated the patient and the patient is stable and ready to be discharged from PACU .    Signed By: Dalia Heading, MD    06/03/2021        INITIAL Post-op Vital signs:   Vitals Value Taken Time   BP 101/59 06/03/21 1345   Temp 36.4 ??C (97.6 ??F) 06/03/21 1300   Pulse 65 06/03/21 1356   Resp 9 06/03/21 1356   SpO2 98 % 06/03/21 1356   Vitals shown include unvalidated  device data.

## 2021-06-03 NOTE — Progress Notes (Signed)
Physical Therapy 06/03/2021    Orders received and chart reviewed up to date. Pt POD 0 T10-L2 PSF with T11-12 lami. Will follow-up tomorrow for PT evaluation as appropriate.       Thank you.  Alfonse Alpers, PT, DPT

## 2021-06-03 NOTE — Interval H&P Note (Signed)
Attempted to call report. Nurse to call back. 

## 2021-06-03 NOTE — Op Note (Deleted)
[facility]    Name:  Lindsay [name], Lindsay [name]  MR#:  [id number]  DOB:  [birth year]  ACCOUNT #:  [id number]  DATE OF SERVICE:  06/03/2021    AMENDED REPORT:  Revised procedures performed on 12.07.2022      PREOPERATIVE DIAGNOSES:  T12 burst fracture, kyphoscoliosis.     POSTOPERATIVE DIAGNOSES:  T12 burst fracture, kyphoscoliosis.     PROCEDURE PERFORMED:  Ponte osteotomy  T11-T12 and L1-L2 and posterior fusion T10-L2 with robotics.  Lumbar laminectomy  with facetectomy for compression of T11-L2.     SURGEON:  Kem Parkinson, MD.     ASSISTANTTenny Craw, PAC.     ANESTHESIA:  General anesthesia.     COMPLICATIONS:  None.     SPECIMENS REMOVED:  None.     IMPLANTS:   Medtronic Solera pedicle screws.     ESTIMATED BLOOD LOSS:  100cc.     INDICATIONS:  This is a pleasant 65 year old female, status post fall from standing height with a T12 compression/burst fracture at T12 with kyphoplasty with subsequent kyphotic deformity with increased pain.  The patient understood the risks and benefits and elected to proceed.     PROCEDURE:  The patient was identified, brought to operative suite, underwent general anesthesia without difficulty.  Preoperative neuromonitoring was placed, baselines obtained and these remained stable throughout the surgical procedure.  Perioperative antibiotics were also given to the patient. .  The patient was placed prone on the open [place] table with all bony prominences well padded.     Back was prepped and draped sterilely.  Time-out confirmed.  Mazor X robotic system was also attached to the right hand side of the bed.  At this time, we then confirmed the time-out we made an incision based off of the T12 kyphotic segment.  We dissected down to approximately T12, L1.  We extended the dissection proximally and distally to T10 and down to L2.  The facet capsules were removed.  We exposed the transverse processes from T10 down to L2.  At that time the spinous  process clamp was placed on approximately T12.  The major X robotic system was then attached to the spinous process clamp.  A sterile towel was placed over the surgical field and a 3 to find scan was performed to define the no fly zone for the robotic arm.  The arm was then placed in the snapshot position to synchronize the navigation component of the end affector arm.  We then used navigation to push the star marker into the midportion of the wound.  Sterile drape was applied to the surgical field.  The Medtronic O2 O-arm was brought in and we did a spin.  This allowed [place] to obtain a 3D image of the thoracolumbar spine.  Using this image on the major X software station we planned out pedicle screw trajectories from T10 down to L2.  We did a standard workflow of robotic guidance and navigated confirmation to drill to 30 mm with a high-speed bur followed by tapping followed by placement of the pedicle screws.  All screws were tested to approximately 20 mA.     After placing the pedicle screws neuromonitoring was stable.  The major X O-arm was then removed from the table.  We then began the decompression portion of the procedure.  The interspinous ligament was removed from L1 and L2 and T12 and L1.  Central laminectomy was started with removal with a #3 #4 [place].  We removed the central lamina.  Facetectomies were performed at T11-T12 and T12-L1.  The also continue the decompression through the pars region bilaterally using the Masonic's bone scalpel.  The pars regions were removed to allow reduction of the deformity.  THe rods were measured, cut and contoured.  Reduction of the kyphotic deformity was performed.  Neuromonitoring was stable.  We then irrigated with antibiotic solution and then pulse irrigation.  We decorticated the facet joints and posterior elements.  Bone graft was packed into the posterior elements.  Setscrews were final tightened to secure the rods to the pedicle screws.     Mini Hemovac placed.   We then closed with #1 Stratafix on the fascial layer, 2-0 Stratafix on the subcutaneous layer, and 3-0 Stratafix on the skin.     The patient was returned to the PACU in stable condition.     Frazier Richards, PAC was present for the entirety of the procedure and vital for the performance of the procedure. Frazier Richards, [facility] assisted with positioning, prepping, draping of the patient before the procedure and instrument manipulation/placement, spinal repositioning and navigation/robotics operation during the procedure as well as wound closure, dressing application and brace application after the procedure. Please note that no intern, resident, or other hospital staff was available to assist during the surgery     Kem Parkinson, MD      [id number]  D:  06/03/2021 10:35  T:  06/03/2021 21:20  JOB #:  [id number] and JOB #: [id number]

## 2021-06-03 NOTE — Anesthesia Pre-Procedure Evaluation (Signed)
Relevant Problems   NEUROLOGY   (+) Depression       Anesthetic History   No history of anesthetic complications            Review of Systems / Medical History  Patient summary reviewed, nursing notes reviewed and pertinent labs reviewed    Pulmonary  Within defined limits                 Neuro/Psych   Within defined limits    CVA  Psychiatric history     Cardiovascular  Within defined limits                Exercise tolerance: >4 METS  Comments: PE   GI/Hepatic/Renal  Within defined limits              Endo/Other  Within defined limits      Arthritis     Other Findings              Physical Exam    Airway  Mallampati: II  TM Distance: > 6 cm  Neck ROM: normal range of motion   Mouth opening: Normal     Cardiovascular  Regular rate and rhythm,  S1 and S2 normal,  no murmur, click, rub, or gallop             Dental  No notable dental hx       Pulmonary  Breath sounds clear to auscultation               Abdominal  GI exam deferred       Other Findings            Anesthetic Plan    ASA: 3  Anesthesia type: general          Induction: Intravenous  Anesthetic plan and risks discussed with: Patient

## 2021-06-04 LAB — CBC WITH AUTO DIFFERENTIAL
Basophils %: 0 % (ref 0–1)
Basophils Absolute: 0 10*3/uL (ref 0.0–0.1)
Eosinophils %: 0 % (ref 0–7)
Eosinophils Absolute: 0 10*3/uL (ref 0.0–0.4)
Granulocyte Absolute Count: 0.1 10*3/uL — ABNORMAL HIGH (ref 0.00–0.04)
Hematocrit: 29 % — ABNORMAL LOW (ref 35.0–47.0)
Hemoglobin: 8.8 g/dL — ABNORMAL LOW (ref 11.5–16.0)
Immature Granulocytes: 1 % — ABNORMAL HIGH (ref 0.0–0.5)
Lymphocytes %: 6 % — ABNORMAL LOW (ref 12–49)
Lymphocytes Absolute: 0.6 10*3/uL — ABNORMAL LOW (ref 0.8–3.5)
MCH: 26.7 PG (ref 26.0–34.0)
MCHC: 30.3 g/dL (ref 30.0–36.5)
MCV: 87.9 FL (ref 80.0–99.0)
MPV: 9.5 FL (ref 8.9–12.9)
Monocytes %: 6 % (ref 5–13)
Monocytes Absolute: 0.6 10*3/uL (ref 0.0–1.0)
NRBC Absolute: 0 10*3/uL (ref 0.00–0.01)
Neutrophils %: 87 % — ABNORMAL HIGH (ref 32–75)
Neutrophils Absolute: 8.6 10*3/uL — ABNORMAL HIGH (ref 1.8–8.0)
Nucleated RBCs: 0 PER 100 WBC
Platelets: 201 10*3/uL (ref 150–400)
RBC: 3.3 M/uL — ABNORMAL LOW (ref 3.80–5.20)
RDW: 13.3 % (ref 11.5–14.5)
WBC: 9.9 10*3/uL (ref 3.6–11.0)

## 2021-06-04 LAB — COMPREHENSIVE METABOLIC PANEL
ALT: 20 U/L (ref 12–78)
AST: 24 U/L (ref 15–37)
Albumin/Globulin Ratio: 0.8 — ABNORMAL LOW (ref 1.1–2.2)
Albumin: 2.7 g/dL — ABNORMAL LOW (ref 3.5–5.0)
Alkaline Phosphatase: 85 U/L (ref 45–117)
Anion Gap: 3 mmol/L — ABNORMAL LOW (ref 5–15)
BUN: 12 MG/DL (ref 6–20)
Bun/Cre Ratio: 14 (ref 12–20)
CO2: 27 mmol/L (ref 21–32)
Calcium: 7.8 MG/DL — ABNORMAL LOW (ref 8.5–10.1)
Chloride: 109 mmol/L — ABNORMAL HIGH (ref 97–108)
Creatinine: 0.84 MG/DL (ref 0.55–1.02)
ESTIMATED GLOMERULAR FILTRATION RATE: >60 mL/min/{1.73_m2} (ref >60)
Globulin: 3.3 g/dL (ref 2.0–4.0)
Glucose: 114 mg/dL — ABNORMAL HIGH (ref 65–100)
Potassium: 4.4 mmol/L (ref 3.5–5.1)
Sodium: 139 mmol/L (ref 136–145)
Total Bilirubin: 0.1 MG/DL — ABNORMAL LOW (ref 0.2–1.0)
Total Protein: 6 g/dL — ABNORMAL LOW (ref 6.4–8.2)

## 2021-06-04 LAB — LIPID PANEL
CHOL/HDL Ratio: 3.9 (ref 0.0–5.0)
Chol/HDL Ratio: 3.9 (ref 0.0–5.0)
Cholesterol, Total: 222 MG/DL — ABNORMAL HIGH (ref <200)
Cholesterol, total: 222 MG/DL — ABNORMAL HIGH (ref <200)
HDL Cholesterol: 57 MG/DL
HDL: 57 MG/DL
LDL Calculated: 121.6 MG/DL — ABNORMAL HIGH (ref 0–100)
LDL, calculated: 121.6 MG/DL — ABNORMAL HIGH (ref 0–100)
Triglyceride: 217 MG/DL — ABNORMAL HIGH (ref <150)
Triglycerides: 217 MG/DL — ABNORMAL HIGH (ref <150)
VLDL Cholesterol Calculated: 43.4 MG/DL
VLDL, calculated: 43.4 MG/DL

## 2021-06-04 LAB — CBC WITH AUTOMATED DIFF
ABS. BASOPHILS: 0 10*3/uL (ref 0.0–0.1)
ABS. EOSINOPHILS: 0 10*3/uL (ref 0.0–0.4)
ABS. IMM. GRANS.: 0.1 10*3/uL — ABNORMAL HIGH (ref 0.00–0.04)
ABS. LYMPHOCYTES: 0.6 10*3/uL — ABNORMAL LOW (ref 0.8–3.5)
ABS. MONOCYTES: 0.6 10*3/uL (ref 0.0–1.0)
ABS. NEUTROPHILS: 8.6 10*3/uL — ABNORMAL HIGH (ref 1.8–8.0)
ABSOLUTE NRBC: 0 10*3/uL (ref 0.00–0.01)
BASOPHILS: 0 % (ref 0–1)
EOSINOPHILS: 0 % (ref 0–7)
HCT: 29 % — ABNORMAL LOW (ref 35.0–47.0)
HGB: 8.8 g/dL — ABNORMAL LOW (ref 11.5–16.0)
IMMATURE GRANULOCYTES: 1 % — ABNORMAL HIGH (ref 0.0–0.5)
LYMPHOCYTES: 6 % — ABNORMAL LOW (ref 12–49)
MCH: 26.7 PG (ref 26.0–34.0)
MCHC: 30.3 g/dL (ref 30.0–36.5)
MCV: 87.9 FL (ref 80.0–99.0)
MONOCYTES: 6 % (ref 5–13)
MPV: 9.5 FL (ref 8.9–12.9)
NEUTROPHILS: 87 % — ABNORMAL HIGH (ref 32–75)
NRBC: 0 PER 100 WBC
PLATELET: 201 10*3/uL (ref 150–400)
RBC: 3.3 M/uL — ABNORMAL LOW (ref 3.80–5.20)
RDW: 13.3 % (ref 11.5–14.5)
WBC: 9.9 10*3/uL (ref 3.6–11.0)

## 2021-06-04 LAB — METABOLIC PANEL, COMPREHENSIVE
A-G Ratio: 0.8 — ABNORMAL LOW (ref 1.1–2.2)
ALT (SGPT): 20 U/L (ref 12–78)
AST (SGOT): 24 U/L (ref 15–37)
Albumin: 2.7 g/dL — ABNORMAL LOW (ref 3.5–5.0)
Alk. phosphatase: 85 U/L (ref 45–117)
Anion gap: 3 mmol/L — ABNORMAL LOW (ref 5–15)
BUN/Creatinine ratio: 14 (ref 12–20)
BUN: 12 MG/DL (ref 6–20)
Bilirubin, total: 0.1 MG/DL — ABNORMAL LOW (ref 0.2–1.0)
CO2: 27 mmol/L (ref 21–32)
Calcium: 7.8 MG/DL — ABNORMAL LOW (ref 8.5–10.1)
Chloride: 109 mmol/L — ABNORMAL HIGH (ref 97–108)
Creatinine: 0.84 MG/DL (ref 0.55–1.02)
Globulin: 3.3 g/dL (ref 2.0–4.0)
Glucose: 114 mg/dL — ABNORMAL HIGH (ref 65–100)
Potassium: 4.4 mmol/L (ref 3.5–5.1)
Protein, total: 6 g/dL — ABNORMAL LOW (ref 6.4–8.2)
Sodium: 139 mmol/L (ref 136–145)
eGFR: >60 mL/min/{1.73_m2} (ref >60)

## 2021-06-04 MED ORDER — DIAZEPAM 5 MG TAB
5 mg | Freq: Every day | ORAL | Status: DC
Start: 2021-06-04 — End: 2021-06-09
  Administered 2021-06-05 – 2021-06-09 (×5): via ORAL

## 2021-06-04 MED ORDER — ROPINIROLE 1 MG TAB
1 mg | Freq: Every evening | ORAL | Status: AC
Start: 2021-06-04 — End: 2021-06-09
  Administered 2021-06-05 – 2021-06-09 (×5): via ORAL

## 2021-06-04 MED ORDER — ROPINIROLE 1 MG TAB
1 mg | Freq: Two times a day (BID) | ORAL | Status: AC
Start: 2021-06-04 — End: 2021-06-09
  Administered 2021-06-04 – 2021-06-09 (×11): via ORAL

## 2021-06-04 MED ORDER — ONDANSETRON 4 MG TAB, RAPID DISSOLVE
4 mg | Freq: Four times a day (QID) | ORAL | Status: AC | PRN
Start: 2021-06-04 — End: 2021-06-09
  Administered 2021-06-04 – 2021-06-06 (×5): via ORAL

## 2021-06-04 MED ORDER — DIAZEPAM 10 MG TAB
10 mg | Freq: Every evening | ORAL | Status: AC
Start: 2021-06-04 — End: 2021-06-09
  Administered 2021-06-05 – 2021-06-09 (×5): via ORAL

## 2021-06-04 MED FILL — OXYCODONE 5 MG TAB: 5 mg | ORAL | Qty: 1

## 2021-06-04 MED FILL — STIMULANT LAXATIVE PLUS 8.6 MG-50 MG TABLET: ORAL | Qty: 1

## 2021-06-04 MED FILL — ONDANSETRON 4 MG TAB, RAPID DISSOLVE: 4 mg | ORAL | Qty: 1

## 2021-06-04 MED FILL — PANTOPRAZOLE 20 MG TAB, DELAYED RELEASE: 20 mg | ORAL | Qty: 1

## 2021-06-04 MED FILL — ACETAMINOPHEN 500 MG TAB: 500 mg | ORAL | Qty: 2

## 2021-06-04 MED FILL — BUPROPION SR 150 MG TAB: 150 mg | ORAL | Qty: 1

## 2021-06-04 MED FILL — CEFAZOLIN 1 GRAM SOLUTION FOR INJECTION: 1 gram | INTRAMUSCULAR | Qty: 2000

## 2021-06-04 MED FILL — ESCITALOPRAM 10 MG TAB: 10 mg | ORAL | Qty: 2

## 2021-06-04 MED FILL — ROPINIROLE 1 MG TAB: 1 mg | ORAL | Qty: 1

## 2021-06-04 MED FILL — DIAZEPAM 5 MG TAB: 5 mg | ORAL | Qty: 1

## 2021-06-04 MED FILL — MIRALAX 17 GRAM ORAL POWDER PACKET: 17 gram | ORAL | Qty: 1

## 2021-06-04 MED FILL — CHOLECALCIFEROL (VITAMIN D3) 1,000 UNIT (25 MCG) TAB: ORAL | Qty: 5

## 2021-06-04 MED FILL — CHILDREN'S ASPIRIN 81 MG CHEWABLE TABLET: 81 mg | ORAL | Qty: 1

## 2021-06-04 NOTE — Consults (Signed)
Consults  by Leretha Pol, PA-C at 06/04/21 1213                Author: Leretha Pol, PA-C  Service: Physician Assistant  Author Type: Physician Assistant       Filed: 06/04/21 1220  Date of Service: 06/04/21 1213  Status: Attested           Editor: Alexxa Sabet, Teena Irani, PA-C (Physician Assistant)  Cosigner: Rebeca Allegra, MD at 06/12/21 [phone]          Attestation signed by Rebeca Allegra, MD at 06/12/21 [phone]          I evaluated this patient, key findings were discussed with the Advanced Practice Provider   Key components of clinical history, HPI, social history, allergies, past medical history, past surgical history, physical exam and medical decision making were discussed   On exam   CVS: S1S2 heard   Skin: warm    key components of medical care were performed.   Patient hospitalized for Back pain, unspecified back location, unspecified back pain laterality, unspecified chronicity [M54.9]   Compression fracture of body of thoracic vertebra (HCC) [S22.000A]   Care plan discussed with APP, key components of MDM performed   Please see APP note for details. Agree with APP assessment and Plan       Rebeca Allegra MD                                            [facility]                                                                                              Hospitalist Progress Note   Leretha Pol, [place]   Answering service: [phone] OR [phone] from in house phone            Date of Service:  06/04/2021   NAME:  Lindsay [name]   DOB:  1956/06/11   MRN:  [id number]           Admission Summary:        Lindsay [name] is a 65 y.o. 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 Ortho noted "recommended T10-L2 posterior spinal fusion secondary to chronic T12 compression fracture with nonunion. Patient reported  symptoms began  after falling off stool in June, 2021.  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 She reported continued mid back pain with also low back pain that radiates to the right lower abdomen. She denied leg pain, no numbness,  tingling, or weakness in the legs, no bowel or bladder changes". Onset of symptoms was gradual with gradually worsening course since  that time.    Patient is currently admitted to the Orthopedic Surgery service and is postop day 1 s/p T10-L2 posterior spinal fusion w/ T11-12 thoracic laminectomy (Mazor/O-Arm) w/ Osteotomies T11, T12, L1 11/30.       We are asked to see the patient in consult to assist in managing her chronic health illnesses.       Review of patient's chart did not reveal any intraoperative or postoperative complications. At this time the patient has no complaints.          Interval history / Subjective:        Saw the patient on rounds this morning, she states she feels good and was able to ambulate down the hallway.           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        Remainder of management per primary team, chronic medical issues  are stable at this time. Hopsital medicine team will sign off at this time, please call with any further questions           Hospital Problems   Date Reviewed: 05/27/2021                            Codes  Class  Noted  POA              Compression fracture of body of thoracic vertebra [facility])  ICD-10-CM: S22.000A   ICD-9-CM: 805.2    06/03/2021  Unknown                          Review of Systems:     A comprehensive review of systems was negative except for that written in the HPI.            Vital Signs:      Last 24hrs VS reviewed since prior progress note. Most recent are:   Visit Vitals      BP  103/87     Pulse  80     Temp  98 ??F (36.7 ??C)     Resp  17     Ht  5' 2.5" (1.588 m)     Wt  67.3 kg (148 lb 5.9 oz)     SpO2  94%        BMI  26.70 kg/m??              Intake/Output Summary (Last 24 hours) at 06/04/2021 1213   Last data filed at 06/04/2021 0710     Gross per 24  hour        Intake  300 ml        Output  1375 ml        Net  -1075 ml              Physical Examination:        I had a face to face encounter with this patient and independently examined them on 06/04/2021 as outlined below:                Constitutional:   No acute distress, cooperative, pleasant      ENT:   Oral mucosa moist, oropharynx benign.      Resp:   CTA bilaterally. No wheezing/rhonchi/rales. No accessory muscle use.      CV:   Regular rhythm, normal rate, no murmurs, gallops, rubs      GI:   Soft, non distended, non tender. normoactive bowel sounds      Musculoskeletal:   No edema, warm, 2+ pulses throughout         Neurologic:   Moves all extremities.  AAOx3, CN II-XII intact                      Data Review:      Review and/or order of clinical lab test   Review and/or order of tests in the radiology section of CPT   Review and/or order of tests in the medicine section of CPT           Labs:          Recent Labs           06/04/21   0030     WBC  9.9     HGB  8.8*     HCT  29.0*        PLT  201          Recent Labs           06/04/21    0030     NA  139     K  4.4     CL  109*     CO2  27     BUN  12     CREA  0.84     GLU  114*        CA  7.8*          Recent Labs           06/04/21   0030     ALT  20     AP  85     TBILI  0.1*     TP  6.0*     ALB  2.7*        GLOB  3.3        No results for input(s): INR, PTP, APTT, INREXT in the last 72 hours.    No results for input(s): FE, TIBC, PSAT, FERR in the last 72 hours.    No results found for: FOL, RBCF    No results for input(s): PH, PCO2, PO2 in the last 72 hours.   No results for input(s): CPK, CKNDX, TROIQ in the last 72 hours.      No lab exists for component: CPKMB     Lab Results         Component  Value  Date/Time            Cholesterol, total  222 (H)  06/04/2021 12:30 AM  HDL Cholesterol  57  06/04/2021 12:30 AM       LDL, calculated  121.6 (H)  06/04/2021 12:30 AM       Triglyceride  217 (H)  06/04/2021 12:30 AM            CHOL/HDL Ratio  3.9  06/04/2021 12:30 AM          Lab Results         Component  Value  Date/Time            Glucose (POC)  100  06/03/2021 06:47 AM       Glucose (POC)  89  03/27/2016 11:54 AM       Glucose (POC)  106 (H)  03/27/2016 08:31 AM       Glucose (POC)  97  03/26/2016 01:21 PM            Glucose (POC)  111 (H)  12/17/2015 07:02 AM          Lab Results         Component  Value  Date/Time            Color  YELLOW/STRAW  05/26/2021 11:22 AM       Appearance  CLEAR  05/26/2021 11:22 AM       Specific gravity  1.017  05/26/2021 11:22 AM       Specific gravity  >1.030 (H)  04/10/2017 07:05 PM       pH (UA)  8.0  05/26/2021 11:22 AM       Protein  Negative  05/26/2021 11:22 AM       Glucose  Negative  05/26/2021 11:22 AM       Ketone  Negative  05/26/2021 11:22 AM       Bilirubin  Negative  05/26/2021 11:22 AM       Urobilinogen  0.2  05/26/2021 11:22 AM       Nitrites  Negative  05/26/2021 11:22 AM       Leukocyte Esterase  Negative  05/26/2021 11:22 AM       Epithelial cells  FEW  05/26/2021 11:22 AM       Bacteria  Negative  05/26/2021 11:22 AM       WBC  0-4   05/26/2021 11:22 AM            RBC  0-5  05/26/2021 11:22 AM                Medications Reviewed:          Current Facility-Administered Medications          Medication  Dose  Route  Frequency           ?  aspirin chewable tablet 81 mg   81 mg  Oral  DAILY     ?  buPROPion SR (WELLBUTRIN SR) tablet 150 mg   150 mg  Oral  DAILY     ?  diazePAM (VALIUM) tablet 5 mg   5 mg  Oral  BID     ?  escitalopram oxalate (LEXAPRO) tablet 20 mg   20 mg  Oral  QHS     ?  Marland KitchenPHARMACY TO SUBSTITUTE PER PROTOCOL (Reordered from: gabapentin enacarbil (Horizant) 600 mg TbER)       Per Protocol     ?  pantoprazole (PROTONIX) tablet 20 mg   20 mg  Oral  ACB&D     ?  rOPINIRole (REQUIP) tablet 1 mg  1 mg  Oral  TID           ?  cholecalciferol (VITAMIN D3) (1000 Units /25 mcg) tablet 5,000 Units   5,000 Units  Oral  DAILY           ?  sodium chloride (NS) flush 5-40 mL   5-40 mL  IntraVENous  Q8H     ?  sodium chloride (NS) flush 5-40 mL   5-40 mL  IntraVENous  PRN     ?  acetaminophen (TYLENOL) tablet 1,000 mg   1,000 mg  Oral  Q6H     ?  oxyCODONE IR (ROXICODONE) tablet 5 mg   5 mg  Oral  Q3H PRN     ?  oxyCODONE IR (ROXICODONE) tablet 10 mg   10 mg  Oral  Q3H PRN     ?  HYDROmorphone (DILAUDID) injection 0.5 mg   0.5 mg  IntraVENous  Q3H PRN     ?  naloxone (NARCAN) injection 0.4 mg   0.4 mg  IntraVENous  PRN     ?  ondansetron (ZOFRAN) injection 4 mg   4 mg  IntraVENous  Q4H PRN     ?  diphenhydrAMINE (BENADRYL) injection 12.5 mg   12.5 mg  IntraVENous  Q6H PRN     ?  senna-docusate (PERICOLACE) 8.6-50 mg per tablet 1 Tablet   1 Tablet  Oral  BID     ?  polyethylene glycol (MIRALAX) packet 17 g   17 g  Oral  DAILY     ?  bisacodyL (DULCOLAX) suppository 10 mg   10 mg  Rectal  DAILY PRN     ?  phenol throat spray (CHLORASEPTIC) 1 Spray   1 Spray  Oral  PRN     ?  benzocaine-menthoL (CEPACOL) lozenge 1 Lozenge   1 Lozenge  Oral  PRN           ?  cyclobenzaprine (FLEXERIL) tablet 10 mg   10 mg  Oral  BID PRN         ______________________________________________________________________   EXPECTED LENGTH OF STAY: 2d 16h   ACTUAL LENGTH OF STAY:          1                    Teena Irani Hady Niemczyk, PA-C

## 2021-06-04 NOTE — Progress Notes (Signed)
Orthopedic Spine Progress Note  Post Op day: 1 Day Post-Op    June 04, 2021 9:08 AM   Admit Date: 06/03/2021  Procedure: Procedure(s):  T10 - L2 POSTERIOR SPINAL FUSION WITH T11 - T12 THORACIC LAMINECTOMY (MAZOR/O-ARM) WITH OSTEOTOMIES T11, T12, L1    Subjective:     Lindsay [name] has no complaints. Pain is well controlled.   Denies leg pain.   Tolerating diet.  No N/V.    Pain Control:   Pain Assessment  Pain Scale 1: Numeric (0 - 10)  Pain Intensity 1: 5  Pain Onset 1: post op  Pain Location 1: Back  Pain Orientation 1: Posterior  Pain Description 1: Constant  Pain Intervention(s) 1: Medication (see MAR)    Objective:          Physical Exam:  General:  Alert and oriented. No acute distress.   Heart:  Respirations unlabored.   Abdomen:   Extremities: Soft, non-tender.  No evidence of cyanosis. Pulses palpable in both upper and lower extremities.       Neurologic:  Musculoskeletal:  No new motor deficits. Neurovascular exam within normal limits.  Sensation stable.  Motor: unchanged C5-T1 and L2-S1.   Homan's sign negative in bilateral lower extremities.  Calves soft, nontender upon palpation and with passive twitch.  Moves both upper and lower extremities.   Incision: clean, dry, and intact.  No significant erythema or swelling.  No active drainage noted.         Vital Signs:    Blood pressure (!) 100/59, pulse 67, temperature 98 ??F (36.7 ??C), resp. rate 17, height 5' 2.5" (1.588 m), weight 148 lb 5.9 oz (67.3 kg), SpO2 94 %.  Temp (24hrs), Avg:97.7 ??F (36.5 ??C), Min:97.3 ??F (36.3 ??C), Max:98 ??F (36.7 ??C)      LAB:    Recent Labs     06/04/21  0030   HCT 29.0*   HGB 8.8*   PLT 201     Lab Results   Component Value Date/Time    Sodium 139 06/04/2021 12:30 AM    Potassium 4.4 06/04/2021 12:30 AM    Chloride 109 (H) 06/04/2021 12:30 AM    CO2 27 06/04/2021 12:30 AM    Glucose 114 (H) 06/04/2021 12:30 AM    BUN 12 06/04/2021 12:30 AM    Creatinine 0.84 06/04/2021 12:30 AM    Calcium 7.8 (L) 06/04/2021 12:30 AM        Intake/Output:12/01 0701 - 12/01 1900  In: -   Out: 75 [Drains:75]  11/29 1901 - 12/01 0700  In: 1620 [I.V.:1620]  Out: 2995 [Urine:2500; Drains:245]    PT/OT:   Gait:                    Assessment:   Patient is 1 Day Post-Op s/p Procedure(s):  T10 - L2 POSTERIOR SPINAL FUSION WITH T11 - T12 THORACIC LAMINECTOMY (MAZOR/O-ARM) WITH OSTEOTOMIES T11, T12, L1    Plan:     1.  Continue PT/OT-- awaiting custom TLSO   2.  Continue established methods of pain control  3.  VTE Prophylaxes - TEDS &/or SCDs   4.  Advance diet  5.  Drain output 75 mL overnight; ok to DC drain today.   6.  Discharge pending       Signed By: Frazier Richards, PA

## 2021-06-04 NOTE — Progress Notes (Signed)
 Problem: Self Care Deficits Care Plan (Adult)  Goal: *Acute Goals and Plan of Care (Insert Text)  Description: FUNCTIONAL STATUS PRIOR TO ADMISSION: Patient was independent and active without use of DME.    HOME SUPPORT: The patient lived with spouse but did not require assist.    Occupational Therapy Goals  Initiated 06/04/2021    1.  Patient will perform lower body dressing with independence using AE PRN within 4 days.  2.  Patient will perform toileting with independence using most appropriate DME within 4 days.    3.  Patient will bathing at independence within 4 days.  4.  Patient will don/doff back brace at modified independence within 4 days.  5.  Patient will verbalize/demonstrate 3/3 back precautions during ADL tasks without cues within 4 days.      Outcome: Progressing Towards Goal   OCCUPATIONAL THERAPY EVALUATION: Spine  Patient: Lindsay [name] (65 y.o. female)  Date: 06/04/2021  Primary Diagnosis: Back pain, unspecified back location, unspecified back pain laterality, unspecified chronicity [M54.9]  Compression fracture of body of thoracic vertebra (HCC) [S22.000A]  Procedure(s) (LRB):  T10 - L2 POSTERIOR SPINAL FUSION WITH T11 - T12 THORACIC LAMINECTOMY (MAZOR/O-ARM) WITH OSTEOTOMIES T11, T12, L1 (N/A) 1 Day Post-Op   Precautions:   Back, Fall No bending, no lifting greater than 5 lbs, no twisting, log-roll technique, repositioning every 20-30 min except when sleeping, brace when OOB     ASSESSMENT :  Based on the objective data described below, patient presents with Minimum assistance upper body ADLs, Stand-by assistance lower body ADLs, and Contact guard assistance and Minimum assistance functional mobility. Pt very pleasant and motivated.  Verbalized 3/3 back precautions. Pt awaiting orthotist to customize a TLSO.  Will con't to follow and address listed goals.     The following are barriers to ADL independence while in acute care:   - Cognitive and/or behavioral:  Intact  - Medical condition:  strength, functional reach, functional endurance, and precautions    - Other:       Patient will benefit from skilled acute intervention to address the above impairments.  Patient's rehabilitation potential is considered to be Excellent    Discharge recommendations: Home health (to increase independence and safety)  If above is not an option then recommend: None    Barriers to discharging home, in addition to above listed impairments: family availability to assist.     Equipment recommendations for successful discharge (if) home: none     PLAN :  Recommendations and Planned Interventions: self care training, functional mobility training, balance training, endurance activities, patient education, home safety training, and family training/education    Frequency/Duration: Patient will be followed by occupational therapy 5 times a week to address goals.     SUBJECTIVE:   Patient stated "You all are so sweet, thank you."    OBJECTIVE DATA SUMMARY:   HISTORY:   Past Medical History:   Diagnosis Date    Abnormal brain scan     Anxiety disorder     Arthritis     Chronic pain     Depression     DVT (deep venous thrombosis) (HCC)     Falls frequently     Fracture of left ankle     Headache(784.0) 2011    Ill-defined condition 09/15/2015    FX R HAND    Neurocutaneous syndrome (HCC)     Other and unspecified hyperlipidemia 12/27/2012    PE (pulmonary embolism) 12/2012    RLS (restless legs  syndrome) 03/16/2016    Dr Ronal Cornish    TIA (transient ischemic attack) 03/2016    Right facial droop, numbness in lip    Torn rotator cuff     right     Past Surgical History:   Procedure Laterality Date    HX APPENDECTOMY      HX CESAREAN SECTION      x4    HX GI      COLONOSCOPY    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 shoulder    HX ORTHOPAEDIC      left ankle fracture treated with air cast    HX ORTHOPAEDIC  12/2011    left shoulder fxt and dislocation    HX OTHER SURGICAL  2010    left  foot torn tendon    HX TUBAL LIGATION      IMPLANT BREAST SILICONE/EQ  2002    Bilateral breast implants    IR KYPHOPLASTY THORACIC  12/12/2019    PR BREAST SURGERY PROCEDURE UNLISTED  2002    AUGMENTATION       Prior Level of Function/Environment/Context:   Occupations in which the patient is/was successful, what are the barriers preventing that success:   Performance Patterns (routines, roles, habits, and rituals):   Personal Interests and/or values:   Expanded or extensive additional review of patient history:     Home Situation  Home Environment: Private residence  # Steps to Enter: 3  Rails to Enter: Yes  Hand Rails : Bilateral  One/Two Story Residence: Two Nutritional therapist of Interior Steps: 14  Interior Rails: Left  Living Alone: No  Support Systems: Spouse/Significant Other, Other Family Member(s)  Patient Expects to be Discharged to:: Home  Current DME Used/Available at Home: Environmental consultant, rolling, Medical laboratory scientific officer, straight  Tub or Shower Type: Shower (built in seat)      EXAMINATION OF PERFORMANCE DEFICITS:  Cognitive/Behavioral Status:  Neurologic State: Alert  Orientation Level: Oriented X4  Cognition: Appropriate decision making        Safety/Judgement: Awareness of environment    Skin: intact    Edema: none noted    Hearing:       Vision/Perceptual:                                     Range of Motion:    AROM: Within functional limits                         Strength:    Strength: Generally decreased, functional                Coordination:  Coordination: Within functional limits  Fine Motor Skills-Upper: Left Intact;Right Intact    Gross Motor Skills-Upper: Left Intact;Right Intact    Tone & Sensation:    Tone: Normal  Sensation: Intact                      Balance:  Sitting: Intact  Standing: Intact;With support    Functional Mobility and Transfers for ADLs:  Bed Mobility:  Supine to Sit: Minimum assistance  Sit to Supine: Minimum assistance    Transfers:  Sit to Stand: Contact guard assistance  Stand to Sit: Contact guard  assistance  Bathroom Mobility: Contact guard assistance    ADL Assessment:  Feeding: Independent  Oral Facial Hygiene/Grooming: Setup    Bathing: Stand-by assistance    Upper Body Dressing: Minimum assistance    Lower Body Dressing: Stand-by assistance    Toileting: Minimum assistance                Patient instructed and demonstrated 3/3 cervical spine precautions with verbal/written cues.     Patient instructed and indicated understanding the benefits of maintaining activity tolerance, functional mobility, and independence with self care tasks during acute stay  to ensure safe return home and to baseline. Encouraged patient to increase frequency and duration OOB, not sitting longer than 30 mins without marching/walking with staff, be out of bed for all meals, perform daily ADLs (as approved by RN/MD regarding bathing etc), and performing functional mobility to/from bathroom. Patient instruction and indicated understanding on body mechanics, ergonomics and gravitational force on the spine during different body positions to plan activities in prep for return home to complete basic ADLs, instrumental ADLs and back to work safely.   Patient instructed and demonstrated while supine hip ER stretch and hold 10 seconds to increase ROM in prep for lower body ADLs daily.  Bathing: Patient instructed and indicated understanding when bathing to not submerge wound in water , stand to shower or sponge bathe, cover wound with plastic and tape to ensure no water  reaches bandage/wound without cues.    Dressing lower body: Patient instructed to don brace first and on the benefits to remain seated to don all clothing to increase independence with precautions and pain management. Patient instructed and demonstrated tailor sitting for lower body dressing.   Toileting: Patient instructed on the benefits of using flushable wet wipes and toilet tongs if decreased reach or pain for peri care. Also, the benefits of a reacher to aid in  Acupuncturist.     Patient instruction and indicated understanding to not strain i.e. holding breath to bear down during a bowel movement, lifting/activity, and sexual activity.     Home safety: Patient instructed and indicated understanding on home modifications and safety [raise height of ADL objects (i.e. clothing, sink items, fridge items, items to mouth when grooming), appropriate height of chair surfaces, recliner safety, change of floor surfaces, clear pathways] to increase independence and fall prevention.    Standing: Patient instructed and indicated understanding to walk up to sink/counter top/surfaces, step into walker, square off while using objects, slide objects along surfaces, to increase adherence to back precautions and fall prevention.  Patient instructed to increase amount of time standing in order to increase independence and tolerance with ADLs. During prolonged standing, can open cabinet door or place foot on stool to decrease spinal pressure/increase pain.   Tub transfer: Patient instructed and indicated understanding regarding when it is safe to begin transfer into tub (complete stairs with PT, advance exercises with PT high enough to clear tub height, and while clothes donned practice with another person present).      Pain:  Controlled Pain    Activity Tolerance:   Good  Please refer to the flowsheet for vital signs taken during this treatment.    After treatment patient left:   Supine in bed   COMMUNICATION/EDUCATION:   The patient's plan of care was discussed with: Physical Therapist and Registered Nurse.    Home safety education was provided and the patient/caregiver indicated understanding., Patient/family have participated as able in goal setting and plan of care., and Patient/family agree to work toward stated goals and plan of care.    This  patient's plan of care is appropriate for delegation to OTA.    Thank you for this referral.  Mitzie Mulberry, OTR/L  Time Calculation: 38  mins

## 2021-06-04 NOTE — Progress Notes (Signed)
Problem: Complex Spine Procedure:  Day of Surgery  Goal: Activity/Safety  Outcome: Progressing Towards Goal  Goal: Nutrition/Diet  Outcome: Progressing Towards Goal  Goal: Medications  Outcome: Progressing Towards Goal  Goal: Respiratory  Outcome: Progressing Towards Goal  Goal: Psychosocial  Outcome: Progressing Towards Goal     Problem: Patient Education: Go to Patient Education Activity  Goal: Patient/Family Education  Outcome: Progressing Towards Goal     Problem: Falls - Risk of  Goal: *Absence of Falls  Description: Document Schmid Fall Risk and appropriate interventions in the flowsheet.  Outcome: Progressing Towards Goal  Note: Fall Risk Interventions:            Medication Interventions: Patient to call before getting OOB         History of Falls Interventions: Utilize gait belt for transfer/ambulation

## 2021-06-04 NOTE — Progress Notes (Signed)
Problem: Complex Spine Procedure:  Post Op Day 1  Goal: Off Pathway (Use only if patient is Off Pathway)  Outcome: Progressing Towards Goal  Goal: Diagnostic Test/Procedures  Outcome: Progressing Towards Goal

## 2021-06-04 NOTE — Progress Notes (Signed)
Physical Therapy  Came to see pt for BID session however pt reports she is having increased back pain, nausea, and unable to tolerate any activity at this time. Pt reports she has restless legs and did not get her medication until just recently.  She reports this caused increased back pain and can only tolerate lying flat on her back at this time.  She asked about other methods to help her back feel more comfortable.  We discussed ice and offered to set this up however pt did not want to roll at this time after moving recently but will call out for staff to get ice later.  Pt was uncomfortable in bed with her head off the top edge of mattress.  Pt was repositioned a bit lower on mattress and placed pillow under her legs for increased comfort.  Pt recalled 3 of 3 back precautions.  Will follow up tomorrow.   Amy Celene Skeen PT

## 2021-06-04 NOTE — Progress Notes (Signed)
Spiritual Care Partner Volunteer visited patient at [facility] in [facility] 5W1 ORTHO SPINE on 06/04/2021   Documented by:    Mariana Arn, MDiv, MAPT

## 2021-06-04 NOTE — Progress Notes (Signed)
Problem: Mobility Impaired (Adult and Pediatric)  Goal: *Acute Goals and Plan of Care (Insert Text)  Description: FUNCTIONAL STATUS PRIOR TO ADMISSION: Patient was independent and active without use of DME.    HOME SUPPORT PRIOR TO ADMISSION: The patient lived with spouse but did not require assist.    Physical Therapy Goals  Initiated 06/04/2021    1. Patient will move from supine to sit and sit to supine  and roll side to side in bed with independence within 4 days.  2. Patient will perform sit to stand with independence within 4 days.  3. Patient will ambulate with independence for 200 feet with the least restrictive device within 4 days.  4. Patient will ascend/descend full flight of stairs with 1 handrail(s) with modified independence within 4 days.  5. Patient will verbalize and demonstrate understanding of spinal precautions (No bending, lifting greater than 5 lbs, or twisting; log-roll technique; frequent repositioning as instructed) within 4 days.     Outcome: Progressing Towards Goal   PHYSICAL THERAPY EVALUATION  Patient: Lindsay [name] (65 y.o. female)  Date: 06/04/2021  Primary Diagnosis: Back pain, unspecified back location, unspecified back pain laterality, unspecified chronicity [M54.9]  Compression fracture of body of thoracic vertebra (HCC) [S22.000A]  Procedure(s) (LRB):  T10 - L2 POSTERIOR SPINAL FUSION WITH T11 - T12 THORACIC LAMINECTOMY (MAZOR/O-ARM) WITH OSTEOTOMIES T11, T12, L1 (N/A) 1 Day Post-Op   Precautions:   Back, Fall, No bending, no lifting greater than 5 lbs, no twisting, log-roll technique, repositioning every 20-30 min except when sleeping, brace when OOB        ASSESSMENT  Based on the objective data described below, the patient presents with good activity tolerance reporting 2/10 back pain,  generalized decreased strength, and minimally impaired functional mobility below her baseline S/P T10 - L2 POSTERIOR SPINAL FUSION WITH T11 - T12 THORACIC LAMINECTOMY POD 1.  Pt provided with  education regarding back precautions, log roll technique, and importance of wearing brace at all times when out of bed.  Pt is waiting to have custom TLSO fitting today.  Pt demonstrated good mechanics with log roll while maintaining spinal alignment to transfer in and OOB.  Pt stood and ambulated for a good distance with slow, steady gait with report of 2/10 back pain.  Pt returned to bed at session end.  Anticipate pt will progress well.       Current Level of Function Impacting Discharge (mobility/balance): bed mobility minimal assist, transfers with CGA, ambulation x 140 feet with CGA    Functional Outcome Measure:  The patient scored Total Score: 28/28 on the Tinetti outcome measure which is indicative of low fall risk.      Other factors to consider for discharge: newly acquired back precautions, fall risk, reports having 2 falls in the last year     Patient will benefit from skilled therapy intervention to address the above noted impairments.       PLAN :  Recommendations and Planned Interventions: bed mobility training, transfer training, gait training, orthotic/prosthetic training, and patient and family training/education      Frequency/Duration: Patient will be followed by physical therapy:  twice daily to address goals.    Recommendation for discharge: (in order for the patient to meet his/her long term goals)  To be determined: home with or without home health pending progress     This discharge recommendation:  Has not yet been discussed the attending provider and/or case management    IF patient discharges  home will need the following DME: brace/splint and recommend reacher          SUBJECTIVE:   Patient stated "I fall sometimes because I am clumsy."    OBJECTIVE DATA SUMMARY:   HISTORY:    Past Medical History:   Diagnosis Date    Abnormal brain scan     Anxiety disorder     Arthritis     Chronic pain     Depression     DVT (deep venous thrombosis) (HCC)     Falls frequently     Fracture of left  ankle     Headache(784.0) 2011    Ill-defined condition 09/15/2015    FX R HAND    Neurocutaneous syndrome (HCC)     Other and unspecified hyperlipidemia 12/27/2012    PE (pulmonary embolism) 12/2012    RLS (restless legs syndrome) 03/16/2016    Dr Scarlett Presto    TIA (transient ischemic attack) 03/2016    Right facial droop, numbness in lip    Torn rotator cuff     right     Past Surgical History:   Procedure Laterality Date    HX APPENDECTOMY      HX CESAREAN SECTION      x4    HX GI      COLONOSCOPY    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 shoulder    HX ORTHOPAEDIC      left ankle fracture treated with air cast    HX ORTHOPAEDIC  12/2011    left shoulder fxt and dislocation    HX OTHER SURGICAL  2010    left foot torn tendon    HX TUBAL LIGATION      IMPLANT BREAST SILICONE/EQ  2002    Bilateral breast implants    IR KYPHOPLASTY THORACIC  12/12/2019    PR BREAST SURGERY PROCEDURE UNLISTED  2002    AUGMENTATION       Personal factors and/or comorbidities impacting plan of care:     Home Situation  Home Environment: Private residence  # Steps to Enter: 3  Rails to Enter: Yes  Hand Rails : Bilateral  One/Two Story Residence: Two Nutritional therapist of Interior Steps: 14  Interior Rails: Left  Living Alone: No  Support Systems: Spouse/Significant Other, Other Family Member(s)  Patient Expects to be Discharged to:: Home  Current DME Used/Available at Home: Environmental consultant, rolling, Cane, straight  Tub or Shower Type: Air traffic controller (built in seat)    EXAMINATION/PRESENTATION/DECISION MAKING:   Critical Behavior:  Neurologic State: Alert  Orientation Level: Oriented X4  Cognition: Appropriate decision making  Safety/Judgement: Awareness of environment       Skin:  dressing, drain intact    Range Of Motion:  AROM: Within functional limits                       Strength:    Strength: Generally decreased, functional                    Tone & Sensation:   Tone: Normal              Sensation: Intact                Coordination:  Coordination: Within functional limits       Functional Mobility:  Bed Mobility:     Supine to Sit: Minimum assistance  Sit to  Supine: Minimum assistance     Transfers:  Sit to Stand: Contact guard assistance  Stand to Sit: Contact guard assistance                       Balance:   Sitting: Intact  Standing: Intact;With support  Ambulation/Gait Training:  Distance (ft): 140 Feet (ft)  Assistive Device: Gait belt  Ambulation - Level of Assistance: Contact guard assistance;Assist x1        Gait Abnormalities: Decreased step clearance              Speed/Cadence: Slow  Step Length: Right shortened;Left shortened                Functional Measure:  Tinetti test:    Sitting Balance: 1  Arises: 2  Attempts to Rise: 2  Immediate Standing Balance: 2  Standing Balance: 2  Nudged: 2  Eyes Closed: 1  Turn 360 Degrees - Continuous/Discontinuous: 1  Turn 360 Degrees - Steady/Unsteady: 1  Sitting Down: 2  Balance Score: 16 Balance total score  Indication of Gait: 1  R Step Length/Height: 1  L Step Length/Height: 1  R Foot Clearance: 1  L Foot Clearance: 1  Step Symmetry: 1  Step Continuity: 1  Path: 2  Trunk: 2  Walking Time: 1  Gait Score: 12 Gait total score  Total Score: 28/28 Overall total score         Tinetti Tool Score Risk of Falls  <19 = High Fall Risk  19-24 = Moderate Fall Risk  25-28 = Low Fall Risk  Tinetti ME. Performance-Oriented Assessment of Mobility Problems in Elderly Patients. JAGS 1986; [id number]. (Scoring Description: PT Bulletin Feb. 10, 1993)    Older adults: Lonn Georgia et al, 2009; n = 1000 [place] elderly evaluated with ABC, POMA, ADL, and IADL)   Mean POMA score for males aged 65-79 years = 26.21(3.40)   Mean POMA score for females age 65-79 years = 25.16(4.30)   Mean POMA score for males over 80 years = 23.29(6.02)   Mean POMA score for females over 80 years = 17.20(8.32)        Physical Therapy Evaluation Charge Determination   History Examination Presentation Decision-Making    MEDIUM  Complexity : 1-2 comorbidities / personal factors will impact the outcome/ POC  LOW Complexity : 1-2 Standardized tests and measures addressing body structure, function, activity limitation and / or participation in recreation  LOW Complexity : Stable, uncomplicated  LOW Complexity : FOTO score of 75-100      Based on the above components, the patient evaluation is determined to be of the following complexity level: LOW     Pain Rating:  2/10, back     Activity Tolerance:   Good      After treatment patient left in no apparent distress:   Supine in bed and Call bell within reach    COMMUNICATION/EDUCATION:   The patient's plan of care was discussed with: Registered nurse.     Fall prevention education was provided and the patient/caregiver indicated understanding. and Patient/family agree to work toward stated goals and plan of care.    Thank you for this referral.  Amy Celene Skeen   Time Calculation: 34 mins

## 2021-06-05 LAB — COMPREHENSIVE METABOLIC PANEL
ALT: 14 U/L (ref 12–78)
AST: 21 U/L (ref 15–37)
Albumin/Globulin Ratio: 0.7 — ABNORMAL LOW (ref 1.1–2.2)
Albumin: 2.6 g/dL — ABNORMAL LOW (ref 3.5–5.0)
Alkaline Phosphatase: 87 U/L (ref 45–117)
Anion Gap: 5 mmol/L (ref 5–15)
BUN: 12 MG/DL (ref 6–20)
Bun/Cre Ratio: 19 (ref 12–20)
CO2: 29 mmol/L (ref 21–32)
Calcium: 8.1 MG/DL — ABNORMAL LOW (ref 8.5–10.1)
Chloride: 107 mmol/L (ref 97–108)
Creatinine: 0.63 MG/DL (ref 0.55–1.02)
ESTIMATED GLOMERULAR FILTRATION RATE: >60 mL/min/{1.73_m2} (ref >60)
Globulin: 3.5 g/dL (ref 2.0–4.0)
Glucose: 144 mg/dL — ABNORMAL HIGH (ref 65–100)
Potassium: 3.7 mmol/L (ref 3.5–5.1)
Sodium: 141 mmol/L (ref 136–145)
Total Bilirubin: 0.3 MG/DL (ref 0.2–1.0)
Total Protein: 6.1 g/dL — ABNORMAL LOW (ref 6.4–8.2)

## 2021-06-05 LAB — CBC WITH AUTO DIFFERENTIAL
Basophils %: 0 % (ref 0–1)
Basophils Absolute: 0 10*3/uL (ref 0.0–0.1)
Eosinophils %: 0 % (ref 0–7)
Eosinophils Absolute: 0 10*3/uL (ref 0.0–0.4)
Granulocyte Absolute Count: 0.1 10*3/uL — ABNORMAL HIGH (ref 0.00–0.04)
Hematocrit: 27 % — ABNORMAL LOW (ref 35.0–47.0)
Hemoglobin: 8.3 g/dL — ABNORMAL LOW (ref 11.5–16.0)
Immature Granulocytes: 1 % — ABNORMAL HIGH (ref 0.0–0.5)
Lymphocytes %: 10 % — ABNORMAL LOW (ref 12–49)
Lymphocytes Absolute: 1 10*3/uL (ref 0.8–3.5)
MCH: 26.9 PG (ref 26.0–34.0)
MCHC: 30.7 g/dL (ref 30.0–36.5)
MCV: 87.7 FL (ref 80.0–99.0)
MPV: 9.4 FL (ref 8.9–12.9)
Monocytes %: 8 % (ref 5–13)
Monocytes Absolute: 0.8 10*3/uL (ref 0.0–1.0)
NRBC Absolute: 0 10*3/uL (ref 0.00–0.01)
Neutrophils %: 81 % — ABNORMAL HIGH (ref 32–75)
Neutrophils Absolute: 8.3 10*3/uL — ABNORMAL HIGH (ref 1.8–8.0)
Nucleated RBCs: 0 PER 100 WBC
Platelets: 195 10*3/uL (ref 150–400)
RBC: 3.08 M/uL — ABNORMAL LOW (ref 3.80–5.20)
RDW: 13.8 % (ref 11.5–14.5)
WBC: 10.3 10*3/uL (ref 3.6–11.0)

## 2021-06-05 LAB — CBC WITH AUTOMATED DIFF
ABS. BASOPHILS: 0 10*3/uL (ref 0.0–0.1)
ABS. EOSINOPHILS: 0 10*3/uL (ref 0.0–0.4)
ABS. IMM. GRANS.: 0.1 10*3/uL — ABNORMAL HIGH (ref 0.00–0.04)
ABS. LYMPHOCYTES: 1 10*3/uL (ref 0.8–3.5)
ABS. MONOCYTES: 0.8 10*3/uL (ref 0.0–1.0)
ABS. NEUTROPHILS: 8.3 10*3/uL — ABNORMAL HIGH (ref 1.8–8.0)
ABSOLUTE NRBC: 0 10*3/uL (ref 0.00–0.01)
BASOPHILS: 0 % (ref 0–1)
EOSINOPHILS: 0 % (ref 0–7)
HCT: 27 % — ABNORMAL LOW (ref 35.0–47.0)
HGB: 8.3 g/dL — ABNORMAL LOW (ref 11.5–16.0)
IMMATURE GRANULOCYTES: 1 % — ABNORMAL HIGH (ref 0.0–0.5)
LYMPHOCYTES: 10 % — ABNORMAL LOW (ref 12–49)
MCH: 26.9 PG (ref 26.0–34.0)
MCHC: 30.7 g/dL (ref 30.0–36.5)
MCV: 87.7 FL (ref 80.0–99.0)
MONOCYTES: 8 % (ref 5–13)
MPV: 9.4 FL (ref 8.9–12.9)
NEUTROPHILS: 81 % — ABNORMAL HIGH (ref 32–75)
NRBC: 0 PER 100 WBC
PLATELET: 195 10*3/uL (ref 150–400)
RBC: 3.08 M/uL — ABNORMAL LOW (ref 3.80–5.20)
RDW: 13.8 % (ref 11.5–14.5)
WBC: 10.3 10*3/uL (ref 3.6–11.0)

## 2021-06-05 LAB — METABOLIC PANEL, COMPREHENSIVE
A-G Ratio: 0.7 — ABNORMAL LOW (ref 1.1–2.2)
ALT (SGPT): 14 U/L (ref 12–78)
AST (SGOT): 21 U/L (ref 15–37)
Albumin: 2.6 g/dL — ABNORMAL LOW (ref 3.5–5.0)
Alk. phosphatase: 87 U/L (ref 45–117)
Anion gap: 5 mmol/L (ref 5–15)
BUN/Creatinine ratio: 19 (ref 12–20)
BUN: 12 MG/DL (ref 6–20)
Bilirubin, total: 0.3 MG/DL (ref 0.2–1.0)
CO2: 29 mmol/L (ref 21–32)
Calcium: 8.1 MG/DL — ABNORMAL LOW (ref 8.5–10.1)
Chloride: 107 mmol/L (ref 97–108)
Creatinine: 0.63 MG/DL (ref 0.55–1.02)
Globulin: 3.5 g/dL (ref 2.0–4.0)
Glucose: 144 mg/dL — ABNORMAL HIGH (ref 65–100)
Potassium: 3.7 mmol/L (ref 3.5–5.1)
Protein, total: 6.1 g/dL — ABNORMAL LOW (ref 6.4–8.2)
Sodium: 141 mmol/L (ref 136–145)
eGFR: >60 mL/min/{1.73_m2} (ref >60)

## 2021-06-05 MED FILL — OXYCODONE 5 MG TAB: 5 mg | ORAL | Qty: 1

## 2021-06-05 MED FILL — ESCITALOPRAM 10 MG TAB: 10 mg | ORAL | Qty: 2

## 2021-06-05 MED FILL — PANTOPRAZOLE 20 MG TAB, DELAYED RELEASE: 20 mg | ORAL | Qty: 1

## 2021-06-05 MED FILL — ROPINIROLE 1 MG TAB: 1 mg | ORAL | Qty: 2

## 2021-06-05 MED FILL — ROPINIROLE 1 MG TAB: 1 mg | ORAL | Qty: 1

## 2021-06-05 MED FILL — ACETAMINOPHEN 500 MG TAB: 500 mg | ORAL | Qty: 2

## 2021-06-05 MED FILL — STIMULANT LAXATIVE PLUS 8.6 MG-50 MG TABLET: ORAL | Qty: 1

## 2021-06-05 MED FILL — CHOLECALCIFEROL (VITAMIN D3) 1,000 UNIT (25 MCG) TAB: ORAL | Qty: 5

## 2021-06-05 MED FILL — DIAZEPAM 5 MG TAB: 5 mg | ORAL | Qty: 1

## 2021-06-05 MED FILL — DIAZEPAM 10 MG TAB: 10 mg | ORAL | Qty: 1

## 2021-06-05 MED FILL — MIRALAX 17 GRAM ORAL POWDER PACKET: 17 gram | ORAL | Qty: 1

## 2021-06-05 MED FILL — ONDANSETRON 4 MG TAB, RAPID DISSOLVE: 4 mg | ORAL | Qty: 1

## 2021-06-05 MED FILL — CHILDREN'S ASPIRIN 81 MG CHEWABLE TABLET: 81 mg | ORAL | Qty: 1

## 2021-06-05 MED FILL — BUPROPION SR 150 MG TAB: 150 mg | ORAL | Qty: 1

## 2021-06-05 NOTE — Progress Notes (Signed)
 Problem: Mobility Impaired (Adult and Pediatric)  Goal: *Acute Goals and Plan of Care (Insert Text)  Description: FUNCTIONAL STATUS PRIOR TO ADMISSION: Patient was independent and active without use of DME.    HOME SUPPORT PRIOR TO ADMISSION: The patient lived with spouse but did not require assist.    Physical Therapy Goals  Initiated 06/04/2021    1. Patient will move from supine to sit and sit to supine  and roll side to side in bed with independence within 4 days.  2. Patient will perform sit to stand with independence within 4 days.  3. Patient will ambulate with independence for 200 feet with the least restrictive device within 4 days.  4. Patient will ascend/descend 4 stairs with 1 handrail(s) with modified independence within 4 days.  5. Patient will verbalize and demonstrate understanding of spinal precautions (No bending, lifting greater than 5 lbs, or twisting; log-roll technique; frequent repositioning as instructed) within 4 days.     Outcome: Progressing Towards Goal   PHYSICAL THERAPY TREATMENT  Patient: Lindsay [name] (65 y.o. female)  Date: 06/05/2021  Diagnosis: Back pain, unspecified back location, unspecified back pain laterality, unspecified chronicity [M54.9]  Compression fracture of body of thoracic vertebra (HCC) [S22.000A] <principal problem not specified>  Procedure(s) (LRB):  T10 - L2 POSTERIOR SPINAL FUSION WITH T11 - T12 THORACIC LAMINECTOMY (MAZOR/O-ARM) WITH OSTEOTOMIES T11, T12, L1 (N/A) 2 Days Post-Op  Precautions: Back, Fall No bending, no lifting greater than 5 lbs, no twisting, log-roll technique, repositioning every 20-30 min except when sleeping, brace when OOB (if ordered)  Chart, physical therapy assessment, plan of care and goals were reviewed.    ASSESSMENT  Patient continues with skilled PT services and is progressing towards goals. Pt received supine in bed and agreeable to therapy. Pt reported nausea has improved significantly and eager to mobilize OOB this morning. Pt's  custom TLSO is now at bedside. Pt continues to be limited by generalized weakness, impaired balance and gait. Pt completed bed mobility with CGA while maintaining log roll technique. Pt performed sit<>Stands without AD with CGA and tolerated gait training x 130 ft with HHA and CGA. Pt with 1-2 episodes of instability requiring additional assist, but recovered via stepping strategy. Pt remained seated in the chair with all needs met. Anticipate pt will be able to return home with HHPT upon discharge    Vitals:    06/05/21 0610 06/05/21 0752 06/05/21 0857 06/05/21 0903   BP:  (!) 97/58  Comment: rn notified (!) 96/58 (!) 96/59   BP 1 Location:  Right upper arm Left upper arm Left upper arm   BP Patient Position:  Lying Supine;At rest Sitting   Pulse: 70 74 72 82   Temp:  99 F (37.2 C)     Resp:  18     Height:       Weight:       SpO2:  93%       .     Current Level of Function Impacting Discharge (mobility/balance): CGA bed mobility, transfers, gait training with HHA    Other factors to consider for discharge:          PLAN :  Patient continues to benefit from skilled intervention to address the above impairments.  Continue treatment per established plan of care.  to address goals.    Recommendation for discharge: (in order for the patient to meet his/her long term goals)  Physical therapy at least 2 days/week in the home  This discharge recommendation:  Has been made in collaboration with the attending provider and/or case management    IF patient discharges home will need the following DME: none       SUBJECTIVE:   Patient stated "I am so glad to be out of this bed."    OBJECTIVE DATA SUMMARY:   Critical Behavior:  Neurologic State: Alert  Orientation Level: Oriented X4  Cognition: Follows commands  Safety/Judgement: Awareness of environment    Spinal diagnosis intervention:  The patient stated 3/3 back precautions when prompted. Reviewed all 3 back precautions, log roll technique, and sitting for 30 minutes  at a time.    Functional Mobility Training:    Bed Mobility:  Log Rolling: Contact guard assistance  Supine to Sit: Contact guard assistance     Scooting: Stand-by assistance        Transfers:  Sit to Stand: Contact guard assistance  Stand to Sit: Contact guard assistance                             Balance:  Sitting: Intact  Standing: Intact;Without support  Ambulation/Gait Training:  Distance (ft): 130 Feet (ft)  Assistive Device: Gait belt;Brace/Splint (HHA x 1)  Ambulation - Level of Assistance: Contact guard assistance        Gait Abnormalities: Decreased step clearance;Trunk sway increased;Path deviations              Speed/Cadence: Slow  Step Length: Right shortened;Left shortened                    Stairs:              Therapeutic Exercises:     Pain Rating:  Pt reported minimal pain    Activity Tolerance:   Good    After treatment patient left in no apparent distress:   Sitting in chair, Call bell within reach, and Side rails x 3    COMMUNICATION/COLLABORATION:   The patient's plan of care was discussed with: Occupational therapist and Registered nurse.     Rosina Pellet, PT, DPT   Time Calculation: 28 mins

## 2021-06-05 NOTE — Progress Notes (Signed)
Orthopedic Spine Progress Note  Post Op day: 2 Days Post-Op    June 05, 2021 7:44 AM   Admit Date: 06/03/2021  Procedure: Procedure(s):  T10 - L2 POSTERIOR SPINAL FUSION WITH T11 - T12 THORACIC LAMINECTOMY (MAZOR/O-ARM) WITH OSTEOTOMIES T11, T12, L1    Subjective:     Lindsay [name] has complaints of some increased soreness overnight.  No leg pain.   .   Tolerating diet.  No N/V.    Pain Control:   Pain Assessment  Pain Scale 1: Numeric (0 - 10)  Pain Intensity 1: 3  Pain Onset 1: post op  Pain Location 1: Back  Pain Orientation 1: Mid  Pain Description 1: Aching  Pain Intervention(s) 1: Medication (see MAR)    Objective:          Physical Exam:  General:  Alert and oriented. No acute distress.   Heart:  Respirations unlabored.   Abdomen:   Extremities: Soft, non-tender.  No evidence of cyanosis. Pulses palpable in both upper and lower extremities.       Neurologic:  Musculoskeletal:  No new motor deficits. Neurovascular exam within normal limits.  Sensation stable.  Motor: unchanged C5-T1 and L2-S1.   Homan's sign negative in bilateral lower extremities.  Calves soft, nontender upon palpation and with passive twitch.  Moves both upper and lower extremities.   Incision: clean, dry, and intact.  No significant erythema or swelling.  No active drainage noted.         Vital Signs:    Blood pressure 120/72, pulse 70, temperature 99.1 ??F (37.3 ??C), resp. rate 16, height 5' 2.5" (1.588 m), weight 148 lb 5.9 oz (67.3 kg), SpO2 90 %.  Temp (24hrs), Avg:98.6 ??F (37 ??C), Min:98 ??F (36.7 ??C), Max:99.7 ??F (37.6 ??C)      LAB:    Recent Labs     06/05/21  0319   HCT 27.0*   HGB 8.3*   PLT 195     Lab Results   Component Value Date/Time    Sodium 141 06/05/2021 03:19 AM    Potassium 3.7 06/05/2021 03:19 AM    Chloride 107 06/05/2021 03:19 AM    CO2 29 06/05/2021 03:19 AM    Glucose 144 (H) 06/05/2021 03:19 AM    BUN 12 06/05/2021 03:19 AM    Creatinine 0.63 06/05/2021 03:19 AM    Calcium 8.1 (L) 06/05/2021 03:19 AM        Intake/Output:No intake/output data recorded.  11/30 1901 - 12/02 0700  In: -   Out: 1375 [Urine:1300; Drains:75]    PT/OT:   Gait:  Gait  Speed/Cadence: Slow  Step Length: Right shortened, Left shortened  Gait Abnormalities: Decreased step clearance  Ambulation - Level of Assistance: Contact guard assistance, Assist x1  Distance (ft): 140 Feet (ft)  Assistive Device: Gait belt                 Assessment:   Patient is 2 Days Post-Op s/p Procedure(s):  T10 - L2 POSTERIOR SPINAL FUSION WITH T11 - T12 THORACIC LAMINECTOMY (MAZOR/O-ARM) WITH OSTEOTOMIES T11, T12, L1    Plan:     1.  Continue PT/OT  2.  Continue established methods of pain control  3.  VTE Prophylaxes - TEDS &/or SCDs   4.  Advance diet  5.  Home health consult  6.  Discharge when cleared by PT  7.  TLSO when OOB      Signed By: Kem Parkinson, MD

## 2021-06-05 NOTE — Progress Notes (Signed)
 Transition of Care.    Reason for Admission:  Thoracolumbar Fusion and Thoracic Laminectomy                   RUR Score:    8%                 Plan for utilizing home health:  Lindsay [name] is planning to utilize home health services         PCP: First and Last name:  Rowland Dorien CROME, MD     Name of Practice:  [facility]   Are you a current patient: Yes/No: Yes   Approximate date of last visit: 05/28/2021   Can you participate in a virtual visit with your PCP:  Yes                    Current Advanced Directive/Advance Care Plan: Full Code  Lindsay [name] stated that she does have an advance care plan.  She was asked to provide a copy to the hospital.      Healthcare Decision Maker:   Click here to complete HealthCare Decision Makers including selection of the Healthcare Decision Maker Relationship (ie Primary)  Lindsay [name]'s healthcare decision maker is her husband,  [name].  [phone])                  Transition of Care Plan:   Lindsay [name] was seen in her room.  Her address and phone number were verified.  Her physical address is [address], [place], [place] [address].  She lives with her husband in a 2 story, single family residence with 4 steps to enter.  She has a cane, a walker, and a raised tiolet seat in her home.  She is employed and does drive.  Her pharmacy is [facility] in [place].  Her daughter will provide transportation at discharge.  She was provided choice for home health company.  She chose to use [facility].  She was given the choice list for home health companies.      A referral was sent through [facility] to [facility].  Their information was placed in Follow-up.      Care Management Interventions  PCP Verified by CM: Yes  Last Visit to PCP: 06/02/21  Palliative Care Criteria Met (RRAT>21 & CHF Dx)?: No  Mode of Transport at Discharge: Self  Transition of Care Consult (CM Consult): Discharge Planning  MyChart Signup: No  Discharge Durable Medical Equipment:  No  Physical Therapy Consult: Yes  Occupational Therapy Consult: Yes  Speech Therapy Consult: No  Support Systems: Spouse/Significant Other, Child(ren)  Confirm Follow Up Transport: Self  The Patient and/or Patient Representative was Provided with a Choice of Provider and Agrees with the Discharge Plan?: Yes  Freedom of Choice List was Provided with Basic Dialogue that Supports the Patient's Individualized Plan of Care/Goals, Treatment Preferences and Shares the Quality Data Associated with the Providers?: Yes  Veteran Resource Information Provided?: No  Discharge Location  Patient Expects to be Discharged to:: Home with home health    Will continue to follow for discharge planning.  Signed By: Lindsay JULIANNA Cordon, LCSW     June 05, 2021

## 2021-06-05 NOTE — Progress Notes (Signed)
 Problem: Self Care Deficits Care Plan (Adult)  Goal: *Acute Goals and Plan of Care (Insert Text)  Description: FUNCTIONAL STATUS PRIOR TO ADMISSION: Patient was independent and active without use of DME.    HOME SUPPORT: The patient lived with spouse but did not require assist.    Occupational Therapy Goals  Initiated 06/04/2021    1.  Patient will perform lower body dressing with independence using AE PRN within 4 days.  2.  Patient will perform toileting with independence using most appropriate DME within 4 days.    3.  Patient will bathing at independence within 4 days.  4.  Patient will don/doff back brace at modified independence within 4 days.  5.  Patient will verbalize/demonstrate 3/3 back precautions during ADL tasks without cues within 4 days.      Outcome: Progressing Towards Goal   OCCUPATIONAL THERAPY TREATMENT: Spine  Patient: Lindsay [name] (65 y.o. female)  Date: 06/05/2021  Diagnosis: Back pain, unspecified back location, unspecified back pain laterality, unspecified chronicity [M54.9]  Compression fracture of body of thoracic vertebra (HCC) [S22.000A] <principal problem not specified>  Procedure(s) (LRB):  T10 - L2 POSTERIOR SPINAL FUSION WITH T11 - T12 THORACIC LAMINECTOMY (MAZOR/O-ARM) WITH OSTEOTOMIES T11, T12, L1 (N/A) 2 Days Post-Op  Precautions: Back, Fall No bending, no lifting greater than 5 lbs, no twisting, log-roll technique, repositioning every 20-30 min except when sleeping, TLSO brace when OOB  Chart, occupational therapy assessment, plan of care, and goals were reviewed.    ASSESSMENT:   The patient presents with Moderate Assistance upper body ADLs, Stand-by assistance lower body ADLs, and Contact guard assistance assist functional mobility. Pt progressing well, very pleasant and motivated.  Pt received custom TLSO today.  Feel pt would benefit from one more additional OT session to address donning/doffing brace, toileting and grooming at sink.    The following are barriers to ADL  independence while in acute care:   - Cognitive and/or behavioral:  Intact  - Medical condition: functional reach, standing balance, and precautions    - Other:       Prior level of function: independent        PLAN:  Patient continues to benefit from skilled intervention to address the above impairments.  Continue treatment per established plan of care.  Recommend with staff: OOB for all meals, access bathroom with assistance  Recommend next OT session: see goals  Discharge recommendations: Home health (to increase independence and safety)  If above is not an option then recommend: None    Barriers to discharging home, in addition to above listed impairments: family availability to assist.     Equipment recommendations for successful discharge (if) home: none     SUBJECTIVE:   Patient stated "You all are angels to me."    OBJECTIVE DATA SUMMARY:   Cognitive/Behavioral Status:  Neurologic State: Alert  Orientation Level: Oriented X4  Cognition: Appropriate decision making        Safety/Judgement: Awareness of environment    Functional Mobility and Transfers for ADLs:  Bed Mobility:  Rolling: Contact guard assistance  Supine to Sit: Contact guard assistance  Scooting: Stand-by assistance    Transfers:  Sit to Stand: Contact guard assistance          Balance:  Sitting: Intact  Standing: Intact;Without support    ADL Intervention:                      Upper Body Dressing Assistance  Orthotics(Brace): Moderate  assistance    Lower Body Dressing Assistance  Dressing Assistance: Modified independent    Toileting  Toileting Assistance: Stand-by assistance    Cognitive Retraining  Safety/Judgement: Awareness of environment    The patient recalled and demonstrated 3/3 back precautions. Reviewed all 3 with patient.    Dressing brace: Patient instructed and demonstrated to don/doff velcro on brace using dominant side, keeping non-dominant side intact. Patient instructed and demonstrated in meantime of being able to stand with  back against wall to don/doff brace, to don/doff seated using lap and bed/chair surface to support brace while manipulating.  Dressing lower body: Patient instructed to don brace first and on the benefits to remain seated to don all clothing to increase independence with precautions and pain management.    Home safety: Patient instructed and indicated understanding on home modifications and safety (raise height of ADL objects, appropriate height of chair surfaces, recliner safety, change of floor surfaces, clear pathways) to increase independence and fall prevention.    Standing: Patient instructed and indicated understanding to walk up to sink/counter top/surfaces, step into walker, square off while using objects, slide objects along surfaces, to increase adherence to back precautions and fall prevention.  Patient instructed to increase amount of time standing in order to increase independence and tolerance with ADLs. During prolonged standing, can open cabinet door or place foot on stool to decrease spinal pressure/increase pain.     Patient instructed and indicated understanding the benefits of maintaining activity tolerance, functional mobility, and independence with self care tasks during acute stay  to ensure safe return home and to baseline. Encouraged patient to increase frequency and duration OOB, not sitting longer than 30 mins without marching/walking with staff, be out of bed for all meals, perform daily ADLs (as approved by RN/MD regarding bathing etc), and performing functional mobility to/from bathroom. Patient instruction and indicated understanding on body mechanics, ergonomics and gravitational force on the spine during different body positions to plan activities in prep for return home to complete instrumental ADLs and back to work safely.     Pain:  Controlled pain    Activity Tolerance:   Good  Please refer to the flowsheet for vital signs taken during this treatment.    After treatment patient  left:   Up in chair  Call light within reach     COMMUNICATION/COLLABORATION:   The patient's plan of care was discussed with: Physical Therapist and Registered Nurse    Mitzie Mulberry, OTR/L  Time Calculation: 28 mins

## 2021-06-05 NOTE — Progress Notes (Signed)
 Problem: Mobility Impaired (Adult and Pediatric)  Goal: *Acute Goals and Plan of Care (Insert Text)  Description: FUNCTIONAL STATUS PRIOR TO ADMISSION: Patient was independent and active without use of DME.    HOME SUPPORT PRIOR TO ADMISSION: The patient lived with spouse but did not require assist.    Physical Therapy Goals  Initiated 06/04/2021    1. Patient will move from supine to sit and sit to supine  and roll side to side in bed with independence within 4 days.  2. Patient will perform sit to stand with independence within 4 days.  3. Patient will ambulate with independence for 200 feet with the least restrictive device within 4 days.  4. Patient will ascend/descend 4 stairs with 1 handrail(s) with modified independence within 4 days.  5. Patient will verbalize and demonstrate understanding of spinal precautions (No bending, lifting greater than 5 lbs, or twisting; log-roll technique; frequent repositioning as instructed) within 4 days.     06/05/2021 1345 by Kristie Knee, PT  Outcome: Progressing Towards Goal   PHYSICAL THERAPY TREATMENT  Patient: Lindsay [name] (65 y.o. female)  Date: 06/05/2021  Diagnosis: Back pain, unspecified back location, unspecified back pain laterality, unspecified chronicity [M54.9]  Compression fracture of body of thoracic vertebra (HCC) [S22.000A] <principal problem not specified>  Procedure(s) (LRB):  T10 - L2 POSTERIOR SPINAL FUSION WITH T11 - T12 THORACIC LAMINECTOMY (MAZOR/O-ARM) WITH OSTEOTOMIES T11, T12, L1 (N/A) 2 Days Post-Op  Precautions: Back, Fall No bending, no lifting greater than 5 lbs, no twisting, log-roll technique, repositioning every 20-30 min except when sleeping, brace when OOB (if ordered)  Chart, physical therapy assessment, plan of care and goals were reviewed.    ASSESSMENT  Patient continues with skilled PT services and is progressing towards goals. Pt received sidelying in bed and agreeable to therapy. Pt tolerated progressive mobility. Pt tolerated  gait training x 130 ft with CGA and use of RW and noted path deviations towards the L. Pt with improved balance and less instability during gait training this afternoon with use of RW. Pt does require assistance to don custom TLSO while seated EOB. Pt will continue to benefit from PT to progress mobility as tolerated. Recommend home with HHPT upon discharge. Will need stair training prior to clearance. .     Current Level of Function Impacting Discharge (mobility/balance): CGA gait training x 130 ft with RW    Other factors to consider for discharge:          PLAN :  Patient continues to benefit from skilled intervention to address the above impairments.  Continue treatment per established plan of care.  to address goals.    Recommendation for discharge: (in order for the patient to meet his/her long term goals)  Physical therapy at least 2 days/week in the home AND ensure assist and/or supervision for safety with mobility    This discharge recommendation:  Has been made in collaboration with the attending provider and/or case management    IF patient discharges home will need the following DME: none       SUBJECTIVE:   Patient stated "I'd like to try to use the bathroom."    OBJECTIVE DATA SUMMARY:   Critical Behavior:  Neurologic State: Alert  Orientation Level: Oriented X4  Cognition: Appropriate decision making  Safety/Judgement: Awareness of environment    Spinal diagnosis intervention:  The patient stated 3/3 back precautions when prompted. Reviewed all 3 back precautions, log roll technique, and sitting for 30 minutes at  a time.    Functional Mobility Training:    Bed Mobility:  Log Rolling: Contact guard assistance  Supine to Sit: Stand-by assistance     Scooting: Stand-by assistance        Transfers:  Sit to Stand: Contact guard assistance  Stand to Sit: Contact guard assistance                             Balance:  Sitting: Intact  Standing: Intact;With support (w/ RW)  Ambulation/Gait Training:  Distance  (ft): 150 Feet (ft)  Assistive Device: Gait belt;Brace/Splint;Walker, rolling  Ambulation - Level of Assistance: Contact guard assistance        Gait Abnormalities: Decreased step clearance;Path deviations              Speed/Cadence: Slow  Step Length: Right shortened;Left shortened                    Stairs:              Therapeutic Exercises:     Pain Rating:  Pt denied pain    Activity Tolerance:   Good    After treatment patient left in no apparent distress:   Sitting in chair, Call bell within reach, and Side rails x 3    COMMUNICATION/COLLABORATION:   The patient's plan of care was discussed with: Occupational therapist and Registered nurse.     Rosina Pellet, PT, DPT   Time Calculation: 18 mins

## 2021-06-05 NOTE — Progress Notes (Signed)
Problem: Patient Education: Go to Patient Education Activity  Goal: Patient/Family Education  Outcome: Progressing Towards Goal

## 2021-06-06 ENCOUNTER — Inpatient Hospital Stay: Admit: 2021-06-06 | Payer: MEDICARE | Primary: Internal Medicine

## 2021-06-06 LAB — COMPREHENSIVE METABOLIC PANEL
ALT: 15 U/L (ref 12–78)
AST: 21 U/L (ref 15–37)
Albumin/Globulin Ratio: 0.7 — ABNORMAL LOW (ref 1.1–2.2)
Albumin: 2.5 g/dL — ABNORMAL LOW (ref 3.5–5.0)
Alkaline Phosphatase: 98 U/L (ref 45–117)
Anion Gap: 5 mmol/L (ref 5–15)
BUN: 10 MG/DL (ref 6–20)
Bun/Cre Ratio: 19 (ref 12–20)
CO2: 29 mmol/L (ref 21–32)
Calcium: 8.4 MG/DL — ABNORMAL LOW (ref 8.5–10.1)
Chloride: 104 mmol/L (ref 97–108)
Creatinine: 0.54 MG/DL — ABNORMAL LOW (ref 0.55–1.02)
ESTIMATED GLOMERULAR FILTRATION RATE: >60 mL/min/{1.73_m2} (ref >60)
Globulin: 3.7 g/dL (ref 2.0–4.0)
Glucose: 117 mg/dL — ABNORMAL HIGH (ref 65–100)
Potassium: 3.4 mmol/L — ABNORMAL LOW (ref 3.5–5.1)
Sodium: 138 mmol/L (ref 136–145)
Total Bilirubin: 0.4 MG/DL (ref 0.2–1.0)
Total Protein: 6.2 g/dL — ABNORMAL LOW (ref 6.4–8.2)

## 2021-06-06 LAB — CBC WITH AUTO DIFFERENTIAL
Basophils %: 0 % (ref 0–1)
Basophils Absolute: 0 10*3/uL (ref 0.0–0.1)
Eosinophils %: 1 % (ref 0–7)
Eosinophils Absolute: 0.1 10*3/uL (ref 0.0–0.4)
Granulocyte Absolute Count: 0.1 10*3/uL — ABNORMAL HIGH (ref 0.00–0.04)
Hematocrit: 28.7 % — ABNORMAL LOW (ref 35.0–47.0)
Hemoglobin: 8.8 g/dL — ABNORMAL LOW (ref 11.5–16.0)
Immature Granulocytes: 1 % — ABNORMAL HIGH (ref 0.0–0.5)
Lymphocytes %: 12 % (ref 12–49)
Lymphocytes Absolute: 1.2 10*3/uL (ref 0.8–3.5)
MCH: 26.7 PG (ref 26.0–34.0)
MCHC: 30.7 g/dL (ref 30.0–36.5)
MCV: 87 FL (ref 80.0–99.0)
MPV: 9.9 FL (ref 8.9–12.9)
Monocytes %: 8 % (ref 5–13)
Monocytes Absolute: 0.8 10*3/uL (ref 0.0–1.0)
NRBC Absolute: 0 10*3/uL (ref 0.00–0.01)
Neutrophils %: 78 % — ABNORMAL HIGH (ref 32–75)
Neutrophils Absolute: 7.8 10*3/uL (ref 1.8–8.0)
Nucleated RBCs: 0 PER 100 WBC
Platelets: 212 10*3/uL (ref 150–400)
RBC: 3.3 M/uL — ABNORMAL LOW (ref 3.80–5.20)
RDW: 13.8 % (ref 11.5–14.5)
WBC: 10 10*3/uL (ref 3.6–11.0)

## 2021-06-06 LAB — CBC WITH AUTOMATED DIFF
ABS. BASOPHILS: 0 10*3/uL (ref 0.0–0.1)
ABS. EOSINOPHILS: 0.1 10*3/uL (ref 0.0–0.4)
ABS. IMM. GRANS.: 0.1 10*3/uL — ABNORMAL HIGH (ref 0.00–0.04)
ABS. LYMPHOCYTES: 1.2 10*3/uL (ref 0.8–3.5)
ABS. MONOCYTES: 0.8 10*3/uL (ref 0.0–1.0)
ABS. NEUTROPHILS: 7.8 10*3/uL (ref 1.8–8.0)
ABSOLUTE NRBC: 0 10*3/uL (ref 0.00–0.01)
BASOPHILS: 0 % (ref 0–1)
EOSINOPHILS: 1 % (ref 0–7)
HCT: 28.7 % — ABNORMAL LOW (ref 35.0–47.0)
HGB: 8.8 g/dL — ABNORMAL LOW (ref 11.5–16.0)
IMMATURE GRANULOCYTES: 1 % — ABNORMAL HIGH (ref 0.0–0.5)
LYMPHOCYTES: 12 % (ref 12–49)
MCH: 26.7 PG (ref 26.0–34.0)
MCHC: 30.7 g/dL (ref 30.0–36.5)
MCV: 87 FL (ref 80.0–99.0)
MONOCYTES: 8 % (ref 5–13)
MPV: 9.9 FL (ref 8.9–12.9)
NEUTROPHILS: 78 % — ABNORMAL HIGH (ref 32–75)
NRBC: 0 PER 100 WBC
PLATELET: 212 10*3/uL (ref 150–400)
RBC: 3.3 M/uL — ABNORMAL LOW (ref 3.80–5.20)
RDW: 13.8 % (ref 11.5–14.5)
WBC: 10 10*3/uL (ref 3.6–11.0)

## 2021-06-06 LAB — METABOLIC PANEL, COMPREHENSIVE
A-G Ratio: 0.7 — ABNORMAL LOW (ref 1.1–2.2)
ALT (SGPT): 15 U/L (ref 12–78)
AST (SGOT): 21 U/L (ref 15–37)
Albumin: 2.5 g/dL — ABNORMAL LOW (ref 3.5–5.0)
Alk. phosphatase: 98 U/L (ref 45–117)
Anion gap: 5 mmol/L (ref 5–15)
BUN/Creatinine ratio: 19 (ref 12–20)
BUN: 10 MG/DL (ref 6–20)
Bilirubin, total: 0.4 MG/DL (ref 0.2–1.0)
CO2: 29 mmol/L (ref 21–32)
Calcium: 8.4 MG/DL — ABNORMAL LOW (ref 8.5–10.1)
Chloride: 104 mmol/L (ref 97–108)
Creatinine: 0.54 MG/DL — ABNORMAL LOW (ref 0.55–1.02)
Globulin: 3.7 g/dL (ref 2.0–4.0)
Glucose: 117 mg/dL — ABNORMAL HIGH (ref 65–100)
Potassium: 3.4 mmol/L — ABNORMAL LOW (ref 3.5–5.1)
Protein, total: 6.2 g/dL — ABNORMAL LOW (ref 6.4–8.2)
Sodium: 138 mmol/L (ref 136–145)
eGFR: >60 mL/min/{1.73_m2} (ref >60)

## 2021-06-06 MED ORDER — SODIUM CHLORIDE 0.9% BOLUS IV
0.9 % | Freq: Once | INTRAVENOUS | Status: AC
Start: 2021-06-06 — End: 2021-06-06
  Administered 2021-06-06: 19:00:00 via INTRAVENOUS

## 2021-06-06 MED ORDER — SODIUM CHLORIDE 0.9 % IV
INTRAVENOUS | Status: AC
Start: 2021-06-06 — End: 2021-06-09
  Administered 2021-06-06 – 2021-06-07 (×2): via INTRAVENOUS

## 2021-06-06 MED FILL — SODIUM CHLORIDE 0.9 % IV: INTRAVENOUS | Qty: 500

## 2021-06-06 MED FILL — OXYCODONE 5 MG TAB: 5 mg | ORAL | Qty: 1

## 2021-06-06 MED FILL — STIMULANT LAXATIVE PLUS 8.6 MG-50 MG TABLET: ORAL | Qty: 1

## 2021-06-06 MED FILL — ROPINIROLE 1 MG TAB: 1 mg | ORAL | Qty: 1

## 2021-06-06 MED FILL — MIRALAX 17 GRAM ORAL POWDER PACKET: 17 gram | ORAL | Qty: 1

## 2021-06-06 MED FILL — PANTOPRAZOLE 20 MG TAB, DELAYED RELEASE: 20 mg | ORAL | Qty: 1

## 2021-06-06 MED FILL — DIAZEPAM 10 MG TAB: 10 mg | ORAL | Qty: 1

## 2021-06-06 MED FILL — ONDANSETRON 4 MG TAB, RAPID DISSOLVE: 4 mg | ORAL | Qty: 1

## 2021-06-06 MED FILL — ACETAMINOPHEN 500 MG TAB: 500 mg | ORAL | Qty: 2

## 2021-06-06 MED FILL — DIAZEPAM 5 MG TAB: 5 mg | ORAL | Qty: 1

## 2021-06-06 MED FILL — SODIUM CHLORIDE 0.9 % IV: INTRAVENOUS | Qty: 1000

## 2021-06-06 MED FILL — BUPROPION SR 150 MG TAB: 150 mg | ORAL | Qty: 1

## 2021-06-06 MED FILL — CHILDREN'S ASPIRIN 81 MG CHEWABLE TABLET: 81 mg | ORAL | Qty: 1

## 2021-06-06 MED FILL — ESCITALOPRAM 10 MG TAB: 10 mg | ORAL | Qty: 2

## 2021-06-06 MED FILL — ROPINIROLE 1 MG TAB: 1 mg | ORAL | Qty: 2

## 2021-06-06 MED FILL — CHOLECALCIFEROL (VITAMIN D3) 1,000 UNIT (25 MCG) TAB: ORAL | Qty: 5

## 2021-06-06 NOTE — Progress Notes (Signed)
Problem: Patient Education: Go to Patient Education Activity  Goal: Patient/Family Education  Outcome: Progressing Towards Goal

## 2021-06-06 NOTE — Progress Notes (Signed)
Problem: Complex Spine Procedure:  Post Op Day 2  Goal: Activity/Safety  Outcome: Progressing Towards Goal  Goal: Nutrition/Diet  Outcome: Progressing Towards Goal  Goal: Medications  Outcome: Progressing Towards Goal  Goal: Psychosocial  Outcome: Progressing Towards Goal     Problem: Pain  Goal: *Control of Pain  Outcome: Progressing Towards Goal     Problem: Falls - Risk of  Goal: *Absence of Falls  Description: Document Schmid Fall Risk and appropriate interventions in the flowsheet.  Outcome: Progressing Towards Goal  Note: Fall Risk Interventions:  Mobility Interventions: Patient to call before getting OOB         Medication Interventions: Patient to call before getting OOB    Elimination Interventions: Patient to call for help with toileting needs    History of Falls Interventions: Door open when patient unattended

## 2021-06-06 NOTE — Consults (Signed)
Consults by Simonne Maffucci, Theophilus Kinds, MD at 06/06/21 1215                Author: Simonne Maffucci, Theophilus Kinds, MD  Service: Hospitalist  Author Type: Physician       Filed: 06/06/21 1232  Date of Service: 06/06/21 1215  Status: Signed          Editor: Simonne Maffucci, Theophilus Kinds, MD (Physician)            Consult Orders        1. IP CONSULT TO HOSPITALIST [[id number]] ordered by Kem Parkinson, MD at 06/06/21 1132                                            [facility]   History and Physical      Date of Service:  06/06/2021   Primary Care Provider: Clarene Critchley, MD   Source of information: The patient and Chart review      Chief Complaint: No chief complaint on file.           History of Presenting Illness:       Lindsay [name] is a 65 y.o. female who presents with low blood pressure and HA.       We are consulted to see this patient who is postop day 3 of T10-L2 posterior spinal fusion with T11-T12 thoracic laminectomy with osteotomies T11, T12, L1.  This patient presented for follow-up outpatient with orthopedic surgery.  Patient was seen for  chronic back pain.  She is admitted to the orthopedic surgery service and is on postop day 3.  We were previously consulted for management of chronic medical conditions, but the patient has been stable on her chronic conditions of been controlled.  Apparently  the patient about 2 days ago started having nausea and vomiting, along with headache.  She reports no changes in her eating habits.  She says that she has been up and out of bed to the chair.  She says that she started having nausea on Thursday and has  had about 1 episode of emesis in each of the last 2 days.  Along with this, she complains of headache.  She is currently on Tylenol and oxycodone and says that this is improved the headache.  Apparently today she was also hypotensive in the 80s and 90s  systolic.  At the time of my exam the patient is normotensive.  She does report  some weakness, expected after surgery, and says that her back incision has been leaking.  At the time of my exam she does not complain of headache.  Patient does not complain  of any lightheadedness, chest pain, shortness of breath, syncope, or other symptoms of hypotension.      The patient denies any blurry vision, sore throat, trouble swallowing, trouble with speech, chest pain, SOB, cough, fever, chills, N/V/D, abd pain, urinary symptoms, constipation, recent travels, sick contacts, focal or generalized neurological symptoms,  falls, injuries, rashes, contact with COVID-19 diagnosed patients, hematemesis, melena, hemoptysis, hematuria, rashes, denies starting any new medications and denies any other concerns or problems besides as mentioned above.             REVIEW OF SYSTEMS:   A comprehensive review of systems was negative except for that written in the History of Present Illness.  Past Medical History:        Diagnosis  Date         ?  Abnormal brain scan       ?  Anxiety disorder       ?  Arthritis       ?  Chronic pain       ?  Depression       ?  DVT (deep venous thrombosis) (HCC)       ?  Falls frequently       ?  Fracture of left ankle       ?  [id number](147.8)  2011     ?  Ill-defined condition  09/15/2015          FX R HAND         ?  Neurocutaneous syndrome (HCC)       ?  Other and unspecified hyperlipidemia  12/27/2012     ?  PE (pulmonary embolism)  12/2012     ?  RLS (restless legs syndrome)  03/16/2016          Dr Scarlett Presto         ?  TIA (transient ischemic attack)  03/2016          Right facial droop, numbness in lip         ?  Torn rotator cuff            right           Past Surgical History:         Procedure  Laterality  Date          ?  HX APPENDECTOMY         ?  HX CESAREAN SECTION              x4          ?  HX GI              COLONOSCOPY          ?  HX KNEE REPLACEMENT  Bilateral  10/29/2015     ?  HX ORTHOPAEDIC    08/2004          rotater cuff r. shoulder          ?  HX  ORTHOPAEDIC              left ankle fracture treated with air cast          ?  HX ORTHOPAEDIC    12/2011          left shoulder fxt and dislocation          ?  HX OTHER SURGICAL    2010          left foot torn tendon          ?  HX TUBAL LIGATION         ?  IMPLANT BREAST SILICONE/EQ    2002          Bilateral breast implants          ?  IR KYPHOPLASTY THORACIC    12/12/2019     ?  PR BREAST SURGERY PROCEDURE UNLISTED    2002          AUGMENTATION          Prior to Admission medications             Medication  Sig  Start Date  End Date  Taking?  Authorizing Provider            buPROPion XL (WELLBUTRIN XL) 150 mg tablet  Take 1 Tablet by mouth every morning.  05/25/21    Yes  Clarene Critchley, MD            escitalopram oxalate (LEXAPRO) 10 mg tablet  Take 1 Tablet by mouth nightly.   Patient taking differently: Take 20 mg by mouth nightly.  05/25/21    Yes  Clarene Critchley, MD            diazePAM (VALIUM) 5 mg tablet  TAKE 1 TABLET BY MOUTH IN THE MORNING & 2 TABLET IN THE EVENING DAILY  04/10/21    Yes  Clarene Critchley, MD     rOPINIRole (REQUIP) 1 mg tablet  TAKE 1 TABLET AT 1-2PM AND TAKE 3 TABLETS BY MOUTH 1 TO 3 HOURS BEFORE BEDTIME EVERYDAY  03/18/21    Yes  Jodi Mourning, MD     gabapentin enacarbil (Horizant) 600 mg TbER  TAKE 1 TABLET BY MOUTH TWICE A DAY  03/12/21    Yes  Jodi Mourning, MD     Vitamin D3 125 mcg (5,000 unit) tab tablet  TAKE 1 TABLET BY MOUTH DAILY  01/09/20    Yes  Provider, Historical     omeprazole (PRILOSEC OTC) 20 mg tablet  Take 20 mg by mouth every evening.      Yes  Provider, Historical     aspirin 81 mg chewable tablet  Take 1 Tab by mouth daily.  03/27/16    Yes  Karsten Ro, MD            ibuprofen (MOTRIN) 200 mg tablet  Take 200 mg by mouth as needed for Pain.   Patient not taking: Reported on 06/03/2021        Provider, Historical        No Known Allergies      Family History         Problem  Relation  Age of Onset          ?  Heart Disease  Mother       ?  Heart Disease   Father       ?  Cancer  Father                thyroid cancer          ?  Arthritis-rheumatoid  Sister       ?  Other  Brother                COVID          ?  No Known Problems  Daughter       ?  No Known Problems  Daughter       ?  No Known Problems  Daughter       ?  No Known Problems  Son            ?  Anesth Problems  Neg Hx           Social History:  reports that she has never smoked. She has never used smokeless tobacco. She reports that she does not drink alcohol and does not use drugs.       Family and social history were personally reviewed, all pertinent and relevant details are outlined as above.        Objective:  Visit Vitals       BP  111/62 (BP 1 Location: Right upper arm, BP Patient Position: Sitting)  Comment (BP Patient Position): in bedside chair after ADLs        Pulse  74     Temp  98.4 ??F (36.9 ??C)     Resp  16     Ht  5' 2.5" (1.588 m)     Wt  67.3 kg (148 lb 5.9 oz)     SpO2  92%        BMI  26.70 kg/m??      O2 Flow Rate (L/min): 4 l/min O2 Device: Nasal cannula      PHYSICAL EXAM:    General: Alert x oriented x 3, awake, no acute distress, resting in bed, pleasant female, appears to be stated age   HEENT: PEERL, EOMI, moist mucus membranes   Neck: Supple, no JVD, no meningeal signs   Chest: Clear to auscultation bilaterally    CVS: RRR, S1 S2 heard, no murmurs/rubs/gallops   Abd: Soft, non-tender, non-distended, +bowel sounds    Ext: No clubbing, no cyanosis, no edema   Neuro/Psych: Pleasant mood and affect, CN 2-12 grossly intact, sensory grossly within normal limit, Strength 5/5 in all extremities, DTR 1+ x 4   Back: Post-surgical wound with serosanguinous drainage.   Cap refill: Brisk, less than 3 seconds   Pulses: 2+, symmetric in all extremities   Skin: Warm, dry, without rashes or lesions      Data Review:    All diagnostic labs and studies have been reviewed.        Abnormal Labs Reviewed       CBC WITH AUTOMATED DIFF - Abnormal; Notable for the following components:             Result  Value            RBC  3.30 (*)         HGB  8.8 (*)         HCT  29.0 (*)         NEUTROPHILS  87 (*)         LYMPHOCYTES  6 (*)         IMMATURE GRANULOCYTES  1 (*)         ABS. NEUTROPHILS  8.6 (*)         ABS. LYMPHOCYTES  0.6 (*)         ABS. IMM. GRANS.  0.1 (*)            All other components within normal limits       METABOLIC PANEL, COMPREHENSIVE - Abnormal; Notable for the following components:            Chloride  109 (*)         Anion gap  3 (*)         Glucose  114 (*)         Calcium  7.8 (*)         Bilirubin, total  0.1 (*)         Protein, total  6.0 (*)         Albumin  2.7 (*)         A-G Ratio  0.8 (*)            All other components within normal limits       LIPID PANEL - Abnormal; Notable for the following components:  Cholesterol, total  222 (*)         Triglyceride  217 (*)         LDL, calculated  121.6 (*)            All other components within normal limits       CBC WITH AUTOMATED DIFF - Abnormal; Notable for the following components:            RBC  3.08 (*)         HGB  8.3 (*)         HCT  27.0 (*)         NEUTROPHILS  81 (*)         LYMPHOCYTES  10 (*)         IMMATURE GRANULOCYTES  1 (*)         ABS. NEUTROPHILS  8.3 (*)         ABS. IMM. GRANS.  0.1 (*)            All other components within normal limits       METABOLIC PANEL, COMPREHENSIVE - Abnormal; Notable for the following components:            Glucose  144 (*)         Calcium  8.1 (*)         Protein, total  6.1 (*)         Albumin  2.6 (*)         A-G Ratio  0.7 (*)            All other components within normal limits       CBC WITH AUTOMATED DIFF - Abnormal; Notable for the following components:            RBC  3.30 (*)         HGB  8.8 (*)         HCT  28.7 (*)         NEUTROPHILS  78 (*)         IMMATURE GRANULOCYTES  1 (*)         ABS. IMM. GRANS.  0.1 (*)            All other components within normal limits       METABOLIC PANEL, COMPREHENSIVE - Abnormal; Notable for the following components:             Potassium  3.4 (*)         Glucose  117 (*)         Creatinine  0.54 (*)         Calcium  8.4 (*)         Protein, total  6.2 (*)         Albumin  2.5 (*)         A-G Ratio  0.7 (*)            All other components within normal limits             All Micro Results              None                     IMAGING:      XR SPINE THORACOLUMB JUNCTION MIN 2 V       Final Result     Fluoroscopic imaging during procedure.  REPORT PROVIDED FOR COMPLIANCE ONLY AT NO CHARGE.                  NC XR TECHNOLOGIST SERVICE    (Results Pending)       XR ABD (KUB)    (Results Pending)            ECG/ECHO:       Results for orders placed or performed during the hospital encounter of 01/10/21     EKG, 12 LEAD, INITIAL         Result  Value  Ref Range            Ventricular Rate  77  BPM       Atrial Rate  77  BPM       P-R Interval  142  ms       QRS Duration  80  ms       Q-T Interval  394  ms       QTC Calculation (Bezet)  445  ms       Calculated P Axis  60  degrees       Calculated R Axis  65  degrees       Calculated T Axis  62  degrees       Diagnosis                 Normal sinus rhythm   Low voltage QRS   When compared with ECG of 18-Jun-2017 19:26,   No significant change was found   Confirmed by Ravindra, P.V. ([id number]) on 01/11/2021 3:08:06 PM                 Assessment:     Given the patient's current clinical presentation, there is a high level of concern for decompensation if discharged from the emergency department. Complex decision making was performed,  which includes reviewing the patient's available past medical records, laboratory results, and imaging studies.      Active Problems:     Compression fracture of body of thoracic vertebra (HCC) (06/03/2021)           Plan:        Compression fracture of T12 vertebra status post T10-L2 posterior spinal fusion with T10-11 to T12 thoracic laminectomy with osteotomies T11, T12, L1 on 11/30.   Postop day 3   PT/OT   Bowel regimen   Continue management per primary  team      Hypotension   Apparently was 99/63 earlier   At the time of my exam patient's blood pressure was 111/62   Giving a small amount of fluid   Continue with IV fluids   Monitor   No symptoms at this time      Nausea and vomiting   Patient reports passing flatus   No bowel movements   Continue bowel regimen   KUB   Monitor for signs and symptoms of obstruction/ileus   Continue PRN zofran      GERD   Continue current management      Hyperlipidemia   Anxiety/depression            DIET: ADULT DIET Regular   ADULT ORAL NUTRITION SUPPLEMENT Breakfast, Lunch; Other Supplement; Ensure Surgery    ISOLATION PRECAUTIONS: There are currently no Active Isolations   CODE STATUS: Full Code    DVT PROPHYLAXIS: SCDs   FUNCTIONAL STATUS PRIOR TO HOSPITALIZATION: Fully active and ambulatory; able to carry on all self-care without restriction.  Ambulatory status/function: By self    EARLY MOBILITY ASSESSMENT: Recommend an assessment from physical therapy and/or occupational therapy   ANTICIPATED DISCHARGE: Greater than 48 hours.   ANTICIPATED DISPOSITION: IPR   EMERGENCY CONTACT/SURROGATE DECISION MAKER: Daughter      CRITICAL CARE WAS PERFORMED FOR THIS ENCOUNTER: NO.            Signed By:  Katrine Coho, MD           June 06, 2021             Please note that this dictation may have been completed with Dragon, the computer voice recognition software.  Quite often unanticipated grammatical, syntax, homophones, and other interpretive  errors are inadvertently transcribed by the computer software.  Please disregard these errors.  Please excuse any errors that have escaped final proofreading.

## 2021-06-06 NOTE — Progress Notes (Signed)
PT Contact Note    Attempted to see pt for PT tx session. RN and pt requesting to defer PT intervention d/t nausea, pain, and pt just returning to bed d/t aforementioned symptoms. Will follow up with patient as able.    Verdia Kuba, PT, DPT

## 2021-06-06 NOTE — Progress Notes (Signed)
 Problem: Self Care Deficits Care Plan (Adult)  Goal: *Acute Goals and Plan of Care (Insert Text)  Description: FUNCTIONAL STATUS PRIOR TO ADMISSION: Patient was independent and active without use of DME.    HOME SUPPORT: The patient lived with spouse but did not require assist.    Occupational Therapy Goals  Initiated 06/04/2021    1.  Patient will perform lower body dressing with independence using AE PRN within 4 days.  2.  Patient will perform toileting with independence using most appropriate DME within 4 days.    3.  Patient will bathing at independence within 4 days.  4.  Patient will don/doff back brace at modified independence within 4 days.  5.  Patient will verbalize/demonstrate 3/3 back precautions during ADL tasks without cues within 4 days.      Outcome: Progressing Towards Goal   OCCUPATIONAL THERAPY TREATMENT  Patient: Lindsay [name] (65 y.o. female)  Date: 06/06/2021  Diagnosis: Back pain, unspecified back location, unspecified back pain laterality, unspecified chronicity [M54.9]  Compression fracture of body of thoracic vertebra (HCC) [S22.000A] <principal problem not specified>  Procedure(s) (LRB):  T10 - L2 POSTERIOR SPINAL FUSION WITH T11 - T12 THORACIC LAMINECTOMY (MAZOR/O-ARM) WITH OSTEOTOMIES T11, T12, L1 (N/A) 3 Days Post-Op  Precautions: Back, Fall  Chart, occupational therapy assessment, plan of care, and goals were reviewed.    ASSESSMENT  Patient continues with skilled OT services and is progressing towards goals.  Pt's performance of ADL/IADL tasks continues to be limited at this time by impaired activity tolerance, generalized weakness, nausea, and HA. Pt received supine and agreeable to participation in therapy session though reporting nausea and HA (ongoing from Thursday). Discussed pt's reported symptoms with RN who cleared pt for participation in OOB ADLs/mobility and stated MD not concerned about dural tear. Throughout session pt reporting mild lightheadedness/dizziness and BP with  mild drop in standing, though stable with mobility and ADLs. She demonstrated excellent carryover knowledge of precautions and implementation into ADL routines performing seated UB/LB dressing with mod I (though minimal assist required for donning custom TLSO). Toilet transfer, toileting routine, bathroom mobility, and standing grooming all demo'd with supervision for safety. Pt returned to bedside chair and left with all needs met. Recommend d/c home with HHOT and assist/supervision for safety with OOB mobility/ADL/IADL tasks.     Current Level of Function Impacting Discharge (ADLs): mod I seated UB/LB dressing, min A don TLSO, toileting/grooming supervision     Other factors to consider for discharge: PLOF, ongoing nausea/vomiting/HA         PLAN :  Patient continues to benefit from skilled intervention to address the above impairments.  Continue treatment per established plan of care to address goals.    Recommendation for discharge: (in order for the patient to meet his/her long term goals)  Occupational therapy at least 2 days/week in the home AND ensure assist and/or supervision for safety with OOB mobility/ADL/IADL tasks    This discharge recommendation:  Has been made in collaboration with the attending provider and/or case management    IF patient discharges home will need the following DME: none       SUBJECTIVE:   Patient stated "I just don't understand why I feel like this, I've never had this reaction to anything before."    OBJECTIVE DATA SUMMARY:   Cognitive/Behavioral Status:  Neurologic State: Alert;Drowsy  Orientation Level: Oriented X4  Cognition: Follows commands  Perception: Appears intact  Perseveration: No perseveration noted  Safety/Judgement: Awareness of environment  Functional Mobility and Transfers for ADLs:  Bed Mobility:  Supine to Sit: Supervision  Scooting: Stand-by assistance    Transfers:  Sit to Stand: Stand-by assistance  Functional Transfers  Bathroom Mobility: Supervision/set  up  Toilet Transfer : Supervision       Balance:  Sitting: Intact  Standing: Intact;With support    ADL Intervention:       Grooming  Grooming Assistance: Supervision  Position Performed: Standing  Washing Hands: Supervision           Upper Body Dressing Assistance  Orthotics(Brace): Minimum assistance  Pullover Shirt: Modified independent  Cues: Verbal cues provided;Physical assistance    Lower Body Dressing Assistance  Underpants: Modified independent  Pants With Elastic Waist: Modified independent  Slip on Shoes with Back: Modified independent  Leg Crossed Method Used: Yes  Position Performed: Seated edge of bed  Cues: Verbal cues provided    Toileting  Toileting Assistance: Supervision  Bladder Hygiene: Supervision  Bowel Hygiene: Supervision  Clothing Management: Supervision  Adaptive Equipment: Grab bars;Walker    Cognitive Retraining  Safety/Judgement: Awareness of environment    The patient recalled and demonstrated 3/3 back precautions. Reviewed all 3 with patient.    Patient instructed and indicated understanding the benefits of maintaining activity tolerance, functional mobility, and independence with self-care tasks during acute stay  to ensure safe return home and to baseline. Encouraged patient to increase frequency and duration OOB, not sitting longer than 30 mins without marching/walking with staff, be out of bed for all meals, perform daily ADLs (as approved by RN/MD regarding bathing etc.), and performing functional mobility to/from bathroom. Patient instruction and indicated understanding on body mechanics, ergonomics and gravitational force on the spine during different body positions to plan activities in prep for return home to complete basic ADLs, instrumental ADLs and back to work safely.    Bathing: Patient instructed and indicated understanding when bathing to not submerge wound in water , stand to shower or sponge bathe, cover wound with plastic and tape to ensure no water  reaches  bandage/wound without cues.    Dressing brace: Patient instructed and demonstrated to don/doff Velcro on brace using dominant side, keeping non-dominant side intact. Patient instructed and demonstrated in meantime of being able to stand with back against wall to don/doff brace, to don/doff seated using lap and bed/chair surface to support brace while manipulating.  Dressing lower body: Patient instructed to don brace first and on the benefits to remain seated to don all clothing to increase independence with precautions and pain management. Patient instructed and demonstrated tailor sitting for lower body dressing with Modified independent.   Toileting: Patient instructed on the benefits of using flushable wet wipes and toilet tongs if decreased reach or pain for peri care. Also, the benefits of a reacher to aid in Acupuncturist.     Patient instruction and indicated understanding to not strain i.e. holding breath to bear down during a bowel movement, lifting/activity, and sexual activity.     Home safety: Patient instructed and indicated understanding on home modifications and safety [raise height of ADL objects (i.e. clothing, sink items, fridge items, items to mouth when grooming), appropriate height of chair surfaces, recliner safety, change of floor surfaces, clear pathways] to increase independence and fall prevention.    Standing: Patient instructed and indicated understanding to walk up to sink/countertop/surfaces, step into walker, square off while using objects, slide objects along surfaces, to increase adherence to back precautions and fall prevention.  Patient instructed to increase  amount of time standing to increase independence and tolerance with ADLs. During prolonged standing, can open cabinet door or place foot on stool to decrease spinal pressure/increase pain.     Pain:  Mild pain reported, pt did not rate    Activity Tolerance:   Good and Fair, limited by nausea/HA    After treatment  patient left in no apparent distress:   Sitting in chair and Call bell within reach    COMMUNICATION/COLLABORATION:   The patient's plan of care was discussed with: Registered nurse.     Vernell Milian, OTD, OTR/L  Time Calculation: 49 mins

## 2021-06-06 NOTE — Progress Notes (Signed)
Orthopedic Spine Progress Note  Post Op day: 3 Days Post-Op    June 06, 2021 9:07 AM   Admit Date: 06/03/2021  Procedure: Procedure(s):  T10 - L2 POSTERIOR SPINAL FUSION WITH T11 - T12 THORACIC LAMINECTOMY (MAZOR/O-ARM) WITH OSTEOTOMIES T11, T12, L1    Subjective:     Lindsay [name] has complaints of expected back pain.  Still with nausea. Passing flatus .  Tolerating diet.      Pain Control:   Pain Assessment  Pain Scale 1: Numeric (0 - 10)  Pain Intensity 1: 3  Pain Onset 1: post op  Pain Location 1: Back  Pain Orientation 1: Mid  Pain Description 1: Aching  Pain Intervention(s) 1: Medication (see MAR)    Objective:          Physical Exam:  General:  Alert and oriented. No acute distress.   Heart:  Respirations unlabored.   Abdomen:   Extremities: Soft, non-tender.  No evidence of cyanosis. Pulses palpable in both upper and lower extremities.       Neurologic:  Musculoskeletal:  No new motor deficits. Neurovascular exam within normal limits.  Sensation stable.  Motor: unchanged C5-T1 and L2-S1.   Homan's sign negative in bilateral lower extremities.  Calves soft, nontender upon palpation and with passive twitch.  Moves both upper and lower extremities.   Incision: clean, dry, and intact.  No significant erythema or swelling.  No active drainage noted.         Vital Signs:    Blood pressure 108/62, pulse 71, temperature 98.4 ??F (36.9 ??C), resp. rate 16, height 5' 2.5" (1.588 m), weight 148 lb 5.9 oz (67.3 kg), SpO2 92 %.  Temp (24hrs), Avg:98.5 ??F (36.9 ??C), Min:98.2 ??F (36.8 ??C), Max:98.7 ??F (37.1 ??C)      LAB:    Recent Labs     06/06/21  0250   HCT 28.7*   HGB 8.8*   PLT 212     Lab Results   Component Value Date/Time    Sodium 138 06/06/2021 02:50 AM    Potassium 3.4 (L) 06/06/2021 02:50 AM    Chloride 104 06/06/2021 02:50 AM    CO2 29 06/06/2021 02:50 AM    Glucose 117 (H) 06/06/2021 02:50 AM    BUN 10 06/06/2021 02:50 AM    Creatinine 0.54 (L) 06/06/2021 02:50 AM    Calcium 8.4 (L) 06/06/2021 02:50 AM        Intake/Output:No intake/output data recorded.  12/01 1901 - 12/03 0700  In: -   Out: 500 [Urine:500]    PT/OT:   Gait:  Gait  Speed/Cadence: Slow  Step Length: Right shortened, Left shortened  Gait Abnormalities: Decreased step clearance, Path deviations  Ambulation - Level of Assistance: Contact guard assistance  Distance (ft): 150 Feet (ft)  Assistive Device: Gait belt, Brace/Splint, Walker, rolling                 Assessment:   Patient is 3 Days Post-Op s/p Procedure(s):  T10 - L2 POSTERIOR SPINAL FUSION WITH T11 - T12 THORACIC LAMINECTOMY (MAZOR/O-ARM) WITH OSTEOTOMIES T11, T12, L1    Plan:     1.  Continue PT/OT  2.  Continue established methods of pain control  3.  VTE Prophylaxes - TEDS &/or SCDs   4.  Advance diet - anti-emetic meds.  5.  Home when cleared by PT vs rehab        Signed By: Kem Parkinson, MD

## 2021-06-06 NOTE — Progress Notes (Signed)
 Problem: Mobility Impaired (Adult and Pediatric)  Goal: *Acute Goals and Plan of Care (Insert Text)  Description: FUNCTIONAL STATUS PRIOR TO ADMISSION: Patient was independent and active without use of DME.    HOME SUPPORT PRIOR TO ADMISSION: The patient lived with spouse but did not require assist.    Physical Therapy Goals  Initiated 06/04/2021    1. Patient will move from supine to sit and sit to supine  and roll side to side in bed with independence within 4 days.  2. Patient will perform sit to stand with independence within 4 days.  3. Patient will ambulate with independence for 200 feet with the least restrictive device within 4 days.  4. Patient will ascend/descend 4 stairs with 1 handrail(s) with modified independence within 4 days.  5. Patient will verbalize and demonstrate understanding of spinal precautions (No bending, lifting greater than 5 lbs, or twisting; log-roll technique; frequent repositioning as instructed) within 4 days.     Outcome: Progressing Towards Goal     PHYSICAL THERAPY TREATMENT  Patient: Lindsay [name] (65 y.o. female)  Date: 06/06/2021  Diagnosis: Back pain, unspecified back location, unspecified back pain laterality, unspecified chronicity [M54.9]  Compression fracture of body of thoracic vertebra (HCC) [S22.000A] <principal problem not specified>  Procedure(s) (LRB):  T10 - L2 POSTERIOR SPINAL FUSION WITH T11 - T12 THORACIC LAMINECTOMY (MAZOR/O-ARM) WITH OSTEOTOMIES T11, T12, L1 (N/A) 3 Days Post-Op  Precautions: Back, Fall No bending, no lifting greater than 5 lbs, no twisting, log-roll technique, repositioning every 20-30 min except when sleeping, brace when OOB (if ordered)  Chart, physical therapy assessment, plan of care and goals were reviewed.    ASSESSMENT  Patient continues with skilled PT services and is progressing towards goals. RN requesting PT to see pt in order to assist with mobility to perform dressing change, cleared by RN to mobilize. Pt moving well,  maintaining back precautions with supine < > sit transfer, but further mobility deferred 2/2 increase in lightheadedness, feeling off, and eye pain while seated EOB. RN present and aware of symptoms. Pt returned to supine. Anticipate pt will make functional progress as symptoms resolve and be able to return home with HHPT services. Will continue to follow and progress mobility as able.      Current Level of Function Impacting Discharge (mobility/balance): Supervision; limited by medical symptoms    Other factors to consider for discharge: current symptoms - HA, lightheadedness, eye pain, nausea         PLAN :  Patient continues to benefit from skilled intervention to address the above impairments.  Continue treatment per established plan of care.  to address goals.    Recommendation for discharge: (in order for the patient to meet his/her long term goals)  Physical therapy at least 2 days/week in the home     This discharge recommendation:  Has been made in collaboration with the attending provider and/or case management    IF patient discharges home will need the following DME: patient owns DME required for discharge       SUBJECTIVE:   Patient stated "I'm sorry I couldn't do more."    OBJECTIVE DATA SUMMARY:   Critical Behavior:  Neurologic State: Alert, Drowsy  Orientation Level: Oriented X4  Cognition: Follows commands  Safety/Judgement: Awareness of environment    Spinal diagnosis intervention:  The patient stated 3/3 back precautions when prompted. Reviewed all 3 back precautions, log roll technique, and sitting for 30 minutes at a time.  Functional Mobility Training:    Bed Mobility:  Log Rolling: Modified independent  Supine to Sit: Supervision  Sit to Supine: Supervision  Scooting: Stand-by assistance  - Pt able to complete log roll to sit EOB        Transfers:  - Deferred                       Balance:  Sitting: Intact;Without support  Standing: Intact;With support    Ambulation/Gait Training:          - Deferred by PT / RN 2/2 symptoms                                               Pain Rating:  Pt does not have significant report of pain; limited by nausea, eye pain, lightheadedness    Activity Tolerance:   Fair  - Pt motivated to participate but unable to progress mobility d/t symptoms    After treatment patient left in no apparent distress:   Supine in bed and Call bell within reach    COMMUNICATION/COLLABORATION:   The patient's plan of care was discussed with: Registered nurse.     Nat Raspberry, PT   Time Calculation: 12 mins

## 2021-06-07 LAB — TYPE AND SCREEN
ABO/Rh: AB POS
Antibody Screen: POSITIVE
Antigen Typing,(RBC): NEGATIVE
Antigen/Antibody: NEGATIVE
Antigen/Antibody: NEGATIVE
Unit Divison: 0
Unit Divison: 0

## 2021-06-07 LAB — TYPE & SCREEN
ABO/Rh(D): AB POS
ANTIGEN TYPING: NEGATIVE
ANTIGEN/ANTIBODY INFO: NEGATIVE
ANTIGEN/ANTIBODY INFO: NEGATIVE
Antibody screen: POSITIVE
Unit division: 0
Unit division: 0

## 2021-06-07 MED FILL — ESCITALOPRAM 10 MG TAB: 10 mg | ORAL | Qty: 2

## 2021-06-07 MED FILL — PANTOPRAZOLE 20 MG TAB, DELAYED RELEASE: 20 mg | ORAL | Qty: 1

## 2021-06-07 MED FILL — ROPINIROLE 1 MG TAB: 1 mg | ORAL | Qty: 2

## 2021-06-07 MED FILL — OXYCODONE 5 MG TAB: 5 mg | ORAL | Qty: 1

## 2021-06-07 MED FILL — ACETAMINOPHEN 500 MG TAB: 500 mg | ORAL | Qty: 2

## 2021-06-07 MED FILL — BUPROPION SR 150 MG TAB: 150 mg | ORAL | Qty: 1

## 2021-06-07 MED FILL — ROPINIROLE 1 MG TAB: 1 mg | ORAL | Qty: 1

## 2021-06-07 MED FILL — STIMULANT LAXATIVE PLUS 8.6 MG-50 MG TABLET: ORAL | Qty: 1

## 2021-06-07 MED FILL — MIRALAX 17 GRAM ORAL POWDER PACKET: 17 gram | ORAL | Qty: 1

## 2021-06-07 MED FILL — CHILDREN'S ASPIRIN 81 MG CHEWABLE TABLET: 81 mg | ORAL | Qty: 1

## 2021-06-07 MED FILL — DIAZEPAM 5 MG TAB: 5 mg | ORAL | Qty: 1

## 2021-06-07 MED FILL — CHOLECALCIFEROL (VITAMIN D3) 1,000 UNIT (25 MCG) TAB: ORAL | Qty: 5

## 2021-06-07 MED FILL — DIAZEPAM 10 MG TAB: 10 mg | ORAL | Qty: 1

## 2021-06-07 NOTE — Progress Notes (Signed)
Problem: Complex Spine Procedure:  Post Op Day 3  Goal: Activity/Safety  Outcome: Progressing Towards Goal  Goal: Nutrition/Diet  Outcome: Progressing Towards Goal  Goal: Medications  Outcome: Progressing Towards Goal  Goal: Respiratory  Outcome: Progressing Towards Goal     Problem: Pain  Goal: *Control of Pain  Outcome: Progressing Towards Goal     Problem: Patient Education: Go to Patient Education Activity  Goal: Patient/Family Education  Outcome: Progressing Towards Goal     Problem: Falls - Risk of  Goal: *Absence of Falls  Description: Document Schmid Fall Risk and appropriate interventions in the flowsheet.  Outcome: Progressing Towards Goal  Note: Fall Risk Interventions:  Mobility Interventions: Communicate number of staff needed for ambulation/transfer, Patient to call before getting OOB         Medication Interventions: Patient to call before getting OOB    Elimination Interventions: Call light in reach, Toileting schedule/hourly rounds    History of Falls Interventions: Door open when patient unattended         Problem: Patient Education: Go to Patient Education Activity  Goal: Patient/Family Education  Outcome: Progressing Towards Goal

## 2021-06-07 NOTE — Progress Notes (Signed)
Problem: Patient Education: Go to Patient Education Activity  Goal: Patient/Family Education  Outcome: Progressing Towards Goal     Problem: Patient Education: Go to Patient Education Activity  Goal: Patient/Family Education  Outcome: Progressing Towards Goal

## 2021-06-07 NOTE — Progress Notes (Signed)
ORTHO POST OP SPINE PROGRESS NOTE    June 07, 2021  Admit Date: 06/03/2021  Admit Diagnosis: Back pain, unspecified back location, unspecified back pain laterality, unspecified chronicity [M54.9];Compression fracture of body of thoracic vertebra (HCC) [S22.000A]  Procedure: Procedure(s):  T10 - L2 POSTERIOR SPINAL FUSION WITH T11 - T12 THORACIC LAMINECTOMY (MAZOR/O-ARM) WITH OSTEOTOMIES T11, T12, L1  Post Op day: 4 Days Post-Op    Subjective:     Lindsay [name] seen and examined.  Notes that nausea seems to be improving since starting iv fluid. Still no appetite.  Minimal therapy to this point    Review of Systems: Pertinent items are noted in HPI.    Objective:     PT/OT:   Distance Ambulated:           Time Ambulated (min):        Ambulation Response:     Activity Response: Fairly tolerated  Assistive Device:              Assistive Device: Fall prevention device, Walker (comment)    Vital Signs:    Blood pressure 108/67, pulse 70, temperature 98.7 ??F (37.1 ??C), resp. rate 14, height 5' 2.5" (1.588 m), weight 148 lb 5.9 oz (67.3 kg), SpO2 95 %.    Temp (24hrs), Avg:98.4 ??F (36.9 ??C), Min:98.2 ??F (36.8 ??C), Max:98.7 ??F (37.1 ??C)      No intake/output data recorded.  No intake/output data recorded.    LAB:    Recent Labs     06/06/21  0250   HGB 8.8*   WBC 10.0   PLT 212       Wound/Drain Assessment:  Drain:      Dressing:     Physical Exam:  General:  Alert and oriented. No acute distress.   Heart:  Respirations unlabored.   Extremities: No evidence of cyanosis.   Pulses: Moves both upper and lower extremities.   Neurologic: no deficit   Musculoskeletal: Calves soft, non tender.       Assessment:      Patient Active Problem List   Diagnosis Code    Depression F32.A    Headache(784.0) R51    Chest pain R07.9    Acute pulmonary embolism (HCC) I26.99    Dyslipidemia, goal LDL below 100 E78.5    Anxiety F41.9    Abnormal brain scan R94.02    Primary osteoarthritis of left knee M17.12    Primary osteoarthritis of  right knee M17.11    RLS (restless legs syndrome) G25.81    Paresthesia of lower lip R20.2    Respiratory failure (HCC) J96.90    Atelectasis J98.11    Closed supracondylar fracture of humerus S42.413A    Back pain M54.9    S/P kyphoplasty Z98.890    Compression fracture of body of thoracic vertebra (HCC) S22.000A       Plan:     1.  Continue PT/OT  2.  Continue established methods of pain control  3.  VTE Prophylaxes - TEDS &/or SCDs   4.  Advance diet - anti-emetic meds.  5.  Home when cleared by PT vs rehab     Tressie Stalker, DO

## 2021-06-07 NOTE — Progress Notes (Signed)
Problem: Mobility Impaired (Adult and Pediatric)  Goal: *Acute Goals and Plan of Care (Insert Text)  Description: FUNCTIONAL STATUS PRIOR TO ADMISSION: Patient was independent and active without use of DME.    HOME SUPPORT PRIOR TO ADMISSION: The patient lived with spouse but did not require assist.    Physical Therapy Goals  Initiated 06/04/2021    1. Patient will move from supine to sit and sit to supine  and roll side to side in bed with independence within 4 days.  2. Patient will perform sit to stand with independence within 4 days.  3. Patient will ambulate with independence for 200 feet with the least restrictive device within 4 days.  4. Patient will ascend/descend 4 stairs with 1 handrail(s) with modified independence within 4 days.  5. Patient will verbalize and demonstrate understanding of spinal precautions (No bending, lifting greater than 5 lbs, or twisting; log-roll technique; frequent repositioning as instructed) within 4 days.     Outcome: Progressing Towards Goal   PHYSICAL THERAPY TREATMENT  Patient: Lindsay [name] (65 y.o. female)  Date: 06/07/2021  Diagnosis: Back pain, unspecified back location, unspecified back pain laterality, unspecified chronicity [M54.9]  Compression fracture of body of thoracic vertebra (HCC) [S22.000A] <principal problem not specified>  Procedure(s) (LRB):  T10 - L2 POSTERIOR SPINAL FUSION WITH T11 - T12 THORACIC LAMINECTOMY (MAZOR/O-ARM) WITH OSTEOTOMIES T11, T12, L1 (N/A) 4 Days Post-Op  Precautions: Back, Fall No bending, no lifting greater than 5 lbs, no twisting, log-roll technique, repositioning every 20-30 min except when sleeping, brace when OOB (if ordered)  Chart, physical therapy assessment, plan of care and goals were reviewed.    ASSESSMENT  Patient continues with skilled PT services and is progressing towards goals. Patient currently limited by continued low BP, gait instability, impaired balance and decreased activity tolerance.  Patient supine in bed upon  arrival and needs supervision for log roll and CGA with increased time to come to sitting.  Sit to stand with CGA and able to amb approx 125 feet with RW and CGA.  BP continues to low but did not fluctuate significantly during session.   Will work on stairs tmrw to prep for DC.    89/56 sitting  95/56 after ambulation    Other factors to consider for discharge: at risk for falls, below baseline, low BP         PLAN :  Patient continues to benefit from skilled intervention to address the above impairments.  Continue treatment per established plan of care.  to address goals.    Recommendation for discharge: (in order for the patient to meet his/her long term goals)  Physical therapy at least 2 days/week in the home         IF patient discharges home will need the following DME: to be determined (TBD)       SUBJECTIVE:   Patient stated ???My blood pressure is always low.  So don't be alarmed.???    OBJECTIVE DATA SUMMARY:   Critical Behavior:  Neurologic State: Alert, Eyes open spontaneously  Orientation Level: Oriented X4  Cognition: Appropriate for age attention/concentration, Appropriate decision making, Appropriate safety awareness, Follows commands  Safety/Judgement: Awareness of environment    Spinal diagnosis intervention:  The patient stated 3/3 back precautions when prompted. Reviewed all 3 back precautions, log roll technique, and sitting for 30 minutes at a time.    Functional Mobility Training:    Bed Mobility:  Log Rolling: Modified independent  Supine to Sit: Supervision  Sit to Supine: Supervision  Scooting: Supervision        Transfers:  Sit to Stand: Stand-by assistance  Stand to Sit: Stand-by assistance                             Balance:  Sitting: Intact;Without support  Standing: Intact;With support  Ambulation/Gait Training:  Distance (ft): 125 Feet (ft)  Assistive Device: Gait belt;Brace/Splint;Walker, rolling  Ambulation - Level of Assistance: Contact guard assistance        Gait Abnormalities:  Decreased step clearance;Antalgic              Speed/Cadence: Slow;Pace decreased (<100 feet/min)  Step Length: Left shortened;Right shortened       Pain Rating:  Reports pain in back but does not rate    Activity Tolerance:   Fair and requires rest breaks    After treatment patient left in no apparent distress:   Sitting in chair and Call bell within reach    COMMUNICATION/COLLABORATION:   The patient???s plan of care was discussed with: Physical therapist, Occupational therapist, and Registered nurse.     Leverne Humbles, PT, DPT   Time Calculation: 31 mins

## 2021-06-07 NOTE — Progress Notes (Signed)
Progress Notes by Simonne Maffucci, Theophilus Kinds, Lindsay [name] at 06/07/21 1418                Author: Simonne Maffucci, Theophilus Kinds, Lindsay [name]  Service: Internal Medicine  Author Type: Physician       Filed: 06/07/21 1420  Date of Service: 06/07/21 1418  Status: Signed          Editor: Simonne Maffucci, Theophilus Kinds, Lindsay [name] (Physician)                             [facility] Adult  Hospitalist Group                                                                                              Hospitalist Progress Note   Lindsay Coho, Lindsay [name]   Answering service: [phone] OR [phone] from in house phone            Date of Service:  06/07/2021   NAME:  Lindsay [name]   DOB:  10/23/1955   MRN:  [id number]           Admission Summary:        Lindsay [name] is a 65 y.o. female who presents with low blood pressure and HA.        We are consulted to see this patient who is postop day 3 of T10-L2 posterior spinal fusion with T11-T12 thoracic laminectomy with osteotomies T11, T12, L1.  This patient presented for follow-up outpatient  with orthopedic surgery.  Patient was seen for chronic back pain.  She is admitted to the orthopedic surgery service and is on postop day 3.  We were previously consulted for management of chronic medical conditions, but the patient has been stable  on her chronic conditions of been controlled.  Apparently the patient about 2 days ago started having nausea and vomiting, along with headache.  She reports no changes in her eating habits.  She says that she has been up and out of bed to the chair.   She says that she started having nausea on Thursday and has had about 1 episode of emesis in each of the last 2 days.  Along with this, she complains of headache.  She is currently on Tylenol and oxycodone and says that this is improved the headache.   Apparently today she was also hypotensive in the 80s and 90s systolic.  At the time of my exam the patient is normotensive.  She does report some weakness, expected after surgery,  and says that her back incision has been leaking.  At the time of  my exam she does not complain of headache.  Patient does not complain of any lightheadedness, chest pain, shortness of breath, syncope, or other symptoms of hypotension.      Interval history / Subjective:        Patient seen and examined earlier this morning by me for follow-up of consult for low blood pressure and headache.  Patient reports feeling really good today.   No complaints.  Assessment & Plan:          Compression fracture of T12 vertebra status post T10-L2 posterior spinal fusion with T10-11 to T12 thoracic laminectomy with osteotomies T11, T12, L1  on 11/30.   Postop day 3   PT/OT   Bowel regimen   Continue management per primary team       Hypotension   Apparently was 99/63 earlier   At the time of my exam patient's blood pressure was 111/62   Giving a small amount of fluid   Continue with IV fluids   Monitor   No symptoms at this time   12/4-no issues today, improved with fluids       Nausea and vomiting   Patient reports passing flatus   No bowel movements   Continue bowel regimen   KUB   Monitor for signs and symptoms of obstruction/ileus   Continue PRN zofran   12/4-much improved   Looks like it may have been some dehydration plus medications   Patient is looking good today       GERD   Continue current management       Hyperlipidemia   Anxiety/depression       Code status: Full   Prophylaxis: SCDs   Care Plan discussed with: Patient, nurse   Anticipated Disposition: Per primary team           Hospital Problems   Date Reviewed: 05/27/2021                            Codes  Class  Noted  POA              Compression fracture of body of thoracic vertebra [facility])  ICD-10-CM: S22.000A   ICD-9-CM: 805.2    06/03/2021  Unknown                          Review of Systems:     A comprehensive review of systems was negative except for that written in the HPI.            Vital Signs:      Last 24hrs VS reviewed since prior progress note.  Most recent are:   Visit Vitals      BP  108/67 (BP 1 Location: Left upper arm, BP Patient Position: At rest;Lying)        Pulse  64     Temp  98.7 ??F (37.1 ??C)     Resp  14     Ht  5' 2.5" (1.588 m)     Wt  67.3 kg (148 lb 5.9 oz)     SpO2  95%        BMI  26.70 kg/m??           No intake or output data in the 24 hours ending 06/07/21 1418         Physical Examination:        I had a face to face encounter with this patient and independently examined them on 06/07/2021 as outlined below:                Constitutional:   No acute distress, cooperative, pleasant      ENT:   Oral mucosa moist, oropharynx benign.      Resp:   CTA bilaterally. No wheezing/rhonchi/rales. No accessory muscle use.      CV:   Regular rhythm,  normal rate, no murmurs, gallops, rubs      GI:   Soft, non distended, non tender. normoactive bowel sounds, no hepatosplenomegaly       Musculoskeletal:   No edema, warm, 2+ pulses throughout         Neurologic:   Moves all extremities.  AAOx3, CN II-XII reviewed                      Data Review:      Review and/or order of clinical lab test   Review and/or order of tests in the radiology section of CPT   Review and/or order of tests in the medicine section of CPT           Labs:          Recent Labs            06/06/21   0250  06/05/21   0319     WBC  10.0  10.3     HGB  8.8*  8.3*     HCT  28.7*  27.0*         PLT  212  195          Recent Labs            06/06/21   0250  06/05/21   0319     NA  138  141     K  3.4*  3.7     CL  104  107     CO2  29  29     BUN  10  12     CREA  0.54*  0.63     GLU  117*  144*         CA  8.4*  8.1*          Recent Labs            06/06/21   0250  06/05/21   0319     ALT  15  14     AP  98  87     TBILI  0.4  0.3     TP  6.2*  6.1*     ALB  2.5*  2.6*         GLOB  3.7  3.5        No results for input(s): INR, PTP, APTT, INREXT in the last 72 hours.    No results for input(s): FE, TIBC, PSAT, FERR in the last 72 hours.    No results found for: FOL, RBCF    No results  for input(s): PH, PCO2, PO2 in the last 72 hours.   No results for input(s): CPK, CKNDX, TROIQ in the last 72 hours.      No lab exists for component: CPKMB     Lab Results         Component  Value  Date/Time            Cholesterol, total  222 (H)  06/04/2021 12:30 AM       HDL Cholesterol  57  06/04/2021 12:30 AM       LDL, calculated  121.6 (H)  06/04/2021 12:30 AM       Triglyceride  217 (H)  06/04/2021 12:30 AM            CHOL/HDL Ratio  3.9  06/04/2021 12:30 AM          Lab Results  Component  Value  Date/Time            Glucose (POC)  100  06/03/2021 06:47 AM       Glucose (POC)  89  03/27/2016 11:54 AM       Glucose (POC)  106 (H)  03/27/2016 08:31 AM       Glucose (POC)  97  03/26/2016 01:21 PM            Glucose (POC)  111 (H)  12/17/2015 07:02 AM          Lab Results         Component  Value  Date/Time            Color  YELLOW/STRAW  05/26/2021 11:22 AM       Appearance  CLEAR  05/26/2021 11:22 AM       Specific gravity  1.017  05/26/2021 11:22 AM       Specific gravity  >1.030 (H)  04/10/2017 07:05 PM       pH (UA)  8.0  05/26/2021 11:22 AM       Protein  Negative  05/26/2021 11:22 AM       Glucose  Negative  05/26/2021 11:22 AM       Ketone  Negative  05/26/2021 11:22 AM       Bilirubin  Negative  05/26/2021 11:22 AM       Urobilinogen  0.2  05/26/2021 11:22 AM       Nitrites  Negative  05/26/2021 11:22 AM       Leukocyte Esterase  Negative  05/26/2021 11:22 AM       Epithelial cells  FEW  05/26/2021 11:22 AM       Bacteria  Negative  05/26/2021 11:22 AM       WBC  0-4  05/26/2021 11:22 AM            RBC  0-5  05/26/2021 11:22 AM                Medications Reviewed:          Current Facility-Administered Medications          Medication  Dose  Route  Frequency           ?  0.9% sodium chloride infusion   75 mL/hr  IntraVENous  CONTINUOUS     ?  diazePAM (VALIUM) tablet 5 mg   5 mg  Oral  DAILY WITH BREAKFAST     ?  diazePAM (VALIUM) tablet 10 mg   10 mg  Oral  QHS     ?  rOPINIRole (REQUIP) tablet  1 mg   1 mg  Oral  BID     ?  rOPINIRole (REQUIP) tablet 2 mg   2 mg  Oral  QHS     ?  ondansetron (ZOFRAN ODT) tablet 4 mg   4 mg  Oral  Q6H PRN     ?  aspirin chewable tablet 81 mg   81 mg  Oral  DAILY     ?  buPROPion SR (WELLBUTRIN SR) tablet 150 mg   150 mg  Oral  DAILY     ?  escitalopram oxalate (LEXAPRO) tablet 20 mg   20 mg  Oral  QHS     ?  gabapentin enacarbil TbER 600 mg (Patient Supplied)   600 mg  Oral  BID     ?  pantoprazole (PROTONIX) tablet 20 mg   20 mg  Oral  ACB&D     ?  cholecalciferol (VITAMIN D3) (1000 Units /25 mcg) tablet 5,000 Units   5,000 Units  Oral  DAILY           ?  sodium chloride (NS) flush 5-40 mL   5-40 mL  IntraVENous  Q8H           ?  sodium chloride (NS) flush 5-40 mL   5-40 mL  IntraVENous  PRN     ?  acetaminophen (TYLENOL) tablet 1,000 mg   1,000 mg  Oral  Q6H     ?  oxyCODONE IR (ROXICODONE) tablet 5 mg   5 mg  Oral  Q3H PRN     ?  oxyCODONE IR (ROXICODONE) tablet 10 mg   10 mg  Oral  Q3H PRN     ?  naloxone (NARCAN) injection 0.4 mg   0.4 mg  IntraVENous  PRN     ?  senna-docusate (PERICOLACE) 8.6-50 mg per tablet 1 Tablet   1 Tablet  Oral  BID     ?  polyethylene glycol (MIRALAX) packet 17 g   17 g  Oral  DAILY     ?  bisacodyL (DULCOLAX) suppository 10 mg   10 mg  Rectal  DAILY PRN     ?  phenol throat spray (CHLORASEPTIC) 1 Spray   1 Spray  Oral  PRN     ?  benzocaine-menthoL (CEPACOL) lozenge 1 Lozenge   1 Lozenge  Oral  PRN           ?  cyclobenzaprine (FLEXERIL) tablet 10 mg   10 mg  Oral  BID PRN        ______________________________________________________________________   EXPECTED LENGTH OF STAY: 2d 16h   ACTUAL LENGTH OF STAY:          4                    Lindsay Coho, Lindsay [name]

## 2021-06-08 MED FILL — CHOLECALCIFEROL (VITAMIN D3) 1,000 UNIT (25 MCG) TAB: ORAL | Qty: 1

## 2021-06-08 MED FILL — MIRALAX 17 GRAM ORAL POWDER PACKET: 17 gram | ORAL | Qty: 1

## 2021-06-08 MED FILL — ACETAMINOPHEN 500 MG TAB: 500 mg | ORAL | Qty: 2

## 2021-06-08 MED FILL — ROPINIROLE 1 MG TAB: 1 mg | ORAL | Qty: 2

## 2021-06-08 MED FILL — BUPROPION SR 150 MG TAB: 150 mg | ORAL | Qty: 1

## 2021-06-08 MED FILL — ROPINIROLE 1 MG TAB: 1 mg | ORAL | Qty: 1

## 2021-06-08 MED FILL — ESCITALOPRAM 10 MG TAB: 10 mg | ORAL | Qty: 2

## 2021-06-08 MED FILL — CHILDREN'S ASPIRIN 81 MG CHEWABLE TABLET: 81 mg | ORAL | Qty: 1

## 2021-06-08 MED FILL — STIMULANT LAXATIVE PLUS 8.6 MG-50 MG TABLET: ORAL | Qty: 1

## 2021-06-08 MED FILL — OXYCODONE 5 MG TAB: 5 mg | ORAL | Qty: 1

## 2021-06-08 MED FILL — DIAZEPAM 5 MG TAB: 5 mg | ORAL | Qty: 1

## 2021-06-08 MED FILL — DIAZEPAM 10 MG TAB: 10 mg | ORAL | Qty: 1

## 2021-06-08 MED FILL — PANTOPRAZOLE 20 MG TAB, DELAYED RELEASE: 20 mg | ORAL | Qty: 1

## 2021-06-08 NOTE — Progress Notes (Signed)
 Problem: Mobility Impaired (Adult and Pediatric)  Goal: *Acute Goals and Plan of Care (Insert Text)  Description: FUNCTIONAL STATUS PRIOR TO ADMISSION: Patient was independent and active without use of DME.    HOME SUPPORT PRIOR TO ADMISSION: The patient lived with spouse but did not require assist.    Physical Therapy Goals  Initiated 06/04/2021    1. Patient will move from supine to sit and sit to supine  and roll side to side in bed with independence within 4 days.  2. Patient will perform sit to stand with independence within 4 days.  3. Patient will ambulate with independence for 200 feet with the least restrictive device within 4 days.  4. Patient will ascend/descend 4 stairs with 1 handrail(s) with modified independence within 4 days.  5. Patient will verbalize and demonstrate understanding of spinal precautions (No bending, lifting greater than 5 lbs, or twisting; log-roll technique; frequent repositioning as instructed) within 4 days.     Outcome: Progressing Towards Goal    PHYSICAL THERAPY TREATMENT  Patient: Lindsay [name] (65 y.o. female)  Date: 06/08/2021  Diagnosis: Back pain, unspecified back location, unspecified back pain laterality, unspecified chronicity [M54.9]  Compression fracture of body of thoracic vertebra (HCC) [S22.000A] <principal problem not specified>  Procedure(s) (LRB):  T10 - L2 POSTERIOR SPINAL FUSION WITH T11 - T12 THORACIC LAMINECTOMY (MAZOR/O-ARM) WITH OSTEOTOMIES T11, T12, L1 (N/A) 5 Days Post-Op  Precautions: Back, Fall  Chart, physical therapy assessment, plan of care and goals were reviewed.    ASSESSMENT  Patient continues with skilled PT services and is progressing towards goals. Pt tolerates logrolling and increased stability with vitals, no orthostatic drops observed with standing 111/66. Pt able to don brace with cues, and navigates stairs x13 with HHA R as pt will have family at home to assist and L rail,  and hallway to complete 326ft with cues for upright posture,  increased weight through legs and improved stability. Will continue to follow and progress as able    Current Level of Function Impacting Discharge (mobility/balance): cues    Other factors to consider for discharge:          PLAN :  Patient continues to benefit from skilled intervention to address the above impairments.  Continue treatment per established plan of care.  to address goals.    Recommendation for discharge: (in order for the patient to meet his/her long term goals)  Physical therapy at least 2 days/week in the home     This discharge recommendation:  Has been made in collaboration with the attending provider and/or case management    IF patient discharges home will need the following DME: to be determined (TBD)       SUBJECTIVE:   Patient stated "I feel so much better today."    OBJECTIVE DATA SUMMARY:   Critical Behavior:  Neurologic State: Alert  Orientation Level: Oriented X4  Cognition: Appropriate decision making  Safety/Judgement: Awareness of environment  Functional Mobility Training:  Bed Mobility:  Rolling: Modified independent  Supine to Sit: Modified independent     Scooting: Modified independent        Transfers:  Sit to Stand: Supervision;Modified independent  Stand to Sit: Modified independent;Supervision                             Balance:  Sitting: Intact;Without support  Standing: Intact;With support  Ambulation/Gait Training:  Distance (ft): 300 Feet (ft)  Assistive Device:  Gait belt;Brace/Splint;Walker, rolling  Ambulation - Level of Assistance: Supervision        Gait Abnormalities: Decreased step clearance;Antalgic              Speed/Cadence: Slow;Pace decreased (<100 feet/min)  Step Length: Left shortened;Right shortened                 Stairs:  Number of Stairs Trained: 13  Stairs - Level of Assistance: Minimum assistance;Contact guard assistance   Rail Use: Left  (CGA-min A R UE HHA)    Pain Rating:  controlled    Activity Tolerance:   Good, Fair, and requires rest  breaks    After treatment patient left in no apparent distress:   Sitting in chair    COMMUNICATION/COLLABORATION:   The patient's plan of care was discussed with: Registered nurse and Case management.     Asberry PARAS Hutcherson, PT   Time Calculation: 40 mins

## 2021-06-08 NOTE — Progress Notes (Signed)
Orthopedic Spine Progress Note  Post Op day: 5 Days Post-Op    June 08, 2021 7:50 AM   Admit Date: 06/03/2021  Procedure: Procedure(s):  T10 - L2 POSTERIOR SPINAL FUSION WITH T11 - T12 THORACIC LAMINECTOMY (MAZOR/O-ARM) WITH OSTEOTOMIES T11, T12, L1    Subjective:     Lindsay [name] has no complaints.  Feeling better today. Nausea much improved.     Tolerating diet.  No N/V.    Pain Control:   Pain Assessment  Pain Scale 1: Numeric (0 - 10)  Pain Intensity 1: 0  Pain Onset 1: post op  Pain Location 1: Back  Pain Orientation 1: Mid  Pain Description 1: Aching  Pain Intervention(s) 1: Medication (see MAR)    Objective:          Physical Exam:  General:  Alert and oriented. No acute distress.   Heart:  Respirations unlabored.   Abdomen:   Extremities: Soft, non-tender.  No evidence of cyanosis. Pulses palpable in both upper and lower extremities.       Neurologic:  Musculoskeletal:  No new motor deficits. Neurovascular exam within normal limits.  Sensation stable.  Motor: unchanged C5-T1 and L2-S1.   Homan's sign negative in bilateral lower extremities.  Calves soft, nontender upon palpation and with passive twitch.  Moves both upper and lower extremities.   Incision: clean, dry, and intact.  No significant erythema or swelling.  No active drainage noted.         Vital Signs:    Blood pressure 95/60, pulse 60, temperature 98 ??F (36.7 ??C), resp. rate 16, height 5' 2.5" (1.588 m), weight 148 lb 5.9 oz (67.3 kg), SpO2 93 %.  Temp (24hrs), Avg:98.1 ??F (36.7 ??C), Min:98 ??F (36.7 ??C), Max:98.7 ??F (37.1 ??C)      LAB:    Recent Labs     06/06/21  0250   HCT 28.7*   HGB 8.8*   PLT 212     Lab Results   Component Value Date/Time    Sodium 138 06/06/2021 02:50 AM    Potassium 3.4 (L) 06/06/2021 02:50 AM    Chloride 104 06/06/2021 02:50 AM    CO2 29 06/06/2021 02:50 AM    Glucose 117 (H) 06/06/2021 02:50 AM    BUN 10 06/06/2021 02:50 AM    Creatinine 0.54 (L) 06/06/2021 02:50 AM    Calcium 8.4 (L) 06/06/2021 02:50 AM        Intake/Output:No intake/output data recorded.  12/03 1901 - 12/05 0700  In: -   Out: 550 [Urine:550]    PT/OT:   Gait:  Gait  Speed/Cadence: Slow, Pace decreased (<100 feet/min)  Step Length: Left shortened, Right shortened  Gait Abnormalities: Decreased step clearance, Antalgic  Ambulation - Level of Assistance: Contact guard assistance  Distance (ft): 125 Feet (ft)  Assistive Device: Gait belt, Brace/Splint, Walker, rolling                 Assessment:   Patient is 5 Days Post-Op s/p Procedure(s):  T10 - L2 POSTERIOR SPINAL FUSION WITH T11 - T12 THORACIC LAMINECTOMY (MAZOR/O-ARM) WITH OSTEOTOMIES T11, T12, L1    Plan:     1.  Continue PT/OT  2.  Continue established methods of pain control  3.  VTE Prophylaxes - TEDS &/or SCDs   4.  Advance diet  5.  Possible preeneo dressing for d/c  6.  Home health consult      Signed By: Kem Parkinson, MD

## 2021-06-08 NOTE — Progress Notes (Signed)
Problem: Patient Education: Go to Patient Education Activity  Goal: Patient/Family Education  Outcome: Progressing Towards Goal

## 2021-06-08 NOTE — Progress Notes (Signed)
Progress  Notes by Leliana Kontz, Celene Skeen, NP at 06/08/21 [phone]                Author: Judy Pimple Celene Skeen, NP  Service: Internal Medicine  Author Type: Nurse Practitioner       Filed: 06/09/21 0806  Date of Service: 06/08/21 0916  Status: Attested Addendum          Editor: Darriel Sinquefield, Celene Skeen, NP (Nurse Practitioner)       Related Notes: Original Note by Peace Noyes, Celene Skeen, NP (Nurse Practitioner) filed at 06/08/21 [phone]          Cosigner: Rebeca Allegra, MD at 06/12/21 1219          Attestation signed by Rebeca Allegra, MD at 06/12/21 1219          I evaluated this patient, key findings were discussed with the Advanced Practice Provider   Key components of clinical history, HPI, social history, allergies, past medical history, past surgical history, physical exam and medical decision making were discussed   On exam   CVS: S1S2 heard   Skin: warm    key components of medical care were performed.   Patient hospitalized for Back pain, unspecified back location, unspecified back pain laterality, unspecified chronicity [M54.9]   Compression fracture of body of thoracic vertebra (HCC) [S22.000A]   Care plan discussed with APP, key components of MDM performed   Please see APP note for details. Agree with APP assessment and Plan       Rebeca Allegra MD                                               [address] [facility]                                                                                              Hospitalist Progress Note   Rowe Pavy, NP   Answering service: [phone] OR [phone] from in house phone            Date of Service:  06/08/2021   NAME:  Lindsay [name]   DOB:  1956/02/20   MRN:  [id number]           Admission Summary:        Lindsay [name] is a 65 y.o. female who presents with low blood pressure and HA.        We are consulted to see this patient who is postop day 3 of T10-L2 posterior spinal fusion with T11-T12 thoracic laminectomy with osteotomies T11, T12, L1.  This patient presented for  follow-up outpatient  with orthopedic surgery.  Patient was seen for chronic back pain.  She is admitted to the orthopedic surgery service and is on postop day 3.  We were previously consulted for management of chronic medical conditions, but the patient has been stable  on her chronic conditions of been controlled.  Apparently the patient about 2 days ago started  having nausea and vomiting, along with headache.  She reports no changes in her eating habits.  She says that she has been up and out of bed to the chair.   She says that she started having nausea on Thursday and has had about 1 episode of emesis in each of the last 2 days.  Along with this, she complains of headache.  She is currently on Tylenol and oxycodone and says that this is improved the headache.   Apparently today she was also hypotensive in the 80s and 90s systolic.  At the time of my exam the patient is normotensive.  She does report some weakness, expected after surgery, and says that her back incision has been leaking.  At the time of  my exam she does not complain of headache.  Patient does not complain of any lightheadedness, chest pain, shortness of breath, syncope, or other symptoms of hypotension.      Interval history / Subjective:        POD #5   BP soft, map > 65   Noted some mild orthostasis upon sitting with therapy however map still 60 and not symptomatic, PT may work with patient   No BM, has eaten much, although this morning she had parfait and 1/2 a bagel. Bowel regimen in place.  Nursing to provide prune juice             Assessment & Plan:          Compression fracture of T12 vertebra s/p T10-L2 posterior spinal fusion with T10-11 to T12 thoracic laminectomy with osteotomies T11, T12, L1 on 11/30.   PT/OT   Bowel regimen   Continue management per primary team       Hypotension   Apparently was 99/63 earlier   Cw ivf   12/4-no issues today, improved with fluids   12/5 asymptomatic, map > 65       Nausea and vomiting   Patient  reports passing flatus   No bowel movements   Continue bowel regimen   12/3 KUB: no ileus/obstruction, constipation   12/4-much improved   Looks like it may have been some dehydration plus medications      Constipation   - bowel regimen in place       GERD   Continue current management       Hyperlipidemia   Anxiety/depression       Code status: Full   Prophylaxis: SCDs   Care Plan discussed with: Patient, nurse   Anticipated Disposition: Per primary team           Hospital Problems   Date Reviewed: 05/27/2021                            Codes  Class  Noted  POA              Compression fracture of body of thoracic vertebra [facility])  ICD-10-CM: S22.000A   ICD-9-CM: 805.2    06/03/2021  Unknown                       Review of Systems:     A comprehensive review of systems was negative except for that written in the HPI.            Vital Signs:      Last 24hrs VS reviewed since prior progress note. Most recent are:   Visit Vitals  BP  (!) 94/56     Pulse  91     Temp  97.8 ??F (36.6 ??C)     Resp  16     Ht  5' 2.5" (1.588 m)     Wt  67.3 kg (148 lb 5.9 oz)     SpO2  91%        BMI  26.70 kg/m??              Intake/Output Summary (Last 24 hours) at 06/08/2021 0916   Last data filed at 06/08/2021 0227     Gross per 24 hour        Intake  --        Output  550 ml        Net  -550 ml                 Physical Examination:        I had a face to face encounter with this patient and independently examined them on 06/08/2021 as outlined below:                Constitutional:   No acute distress, cooperative, pleasant      ENT:   Oral mucosa moist, oropharynx benign.      Resp:   CTA bilaterally. No wheezing/rhonchi/rales. No accessory muscle use.      CV:   Regular rhythm, normal rate, no murmurs, gallops, rubs      GI:   Soft, non distended, non tender. normoactive bowel sounds, no hepatosplenomegaly       Musculoskeletal:   No edema, warm, 2+ pulses throughout         Neurologic:   Moves all extremities.  AAOx3, CN II-XII  reviewed                      Data Review:      Review and/or order of clinical lab test   Review and/or order of tests in the radiology section of CPT   Review and/or order of tests in the medicine section of CPT           Labs:          Recent Labs           06/06/21   0250     WBC  10.0     HGB  8.8*     HCT  28.7*        PLT  212             Recent Labs           06/06/21   0250     NA  138     K  3.4*     CL  104     CO2  29     BUN  10     CREA  0.54*     GLU  117*        CA  8.4*             Recent Labs           06/06/21   0250     ALT  15     AP  98     TBILI  0.4     TP  6.2*     ALB  2.5*        GLOB  3.7  No results for input(s): INR, PTP, APTT, INREXT, INREXT in the last 72 hours.    No results for input(s): FE, TIBC, PSAT, FERR in the last 72 hours.    No results found for: FOL, RBCF    No results for input(s): PH, PCO2, PO2 in the last 72 hours.   No results for input(s): CPK, CKNDX, TROIQ in the last 72 hours.      No lab exists for component: CPKMB     Lab Results         Component  Value  Date/Time            Cholesterol, total  222 (H)  06/04/2021 12:30 AM       HDL Cholesterol  57  06/04/2021 12:30 AM       LDL, calculated  121.6 (H)  06/04/2021 12:30 AM       Triglyceride  217 (H)  06/04/2021 12:30 AM            CHOL/HDL Ratio  3.9  06/04/2021 12:30 AM          Lab Results         Component  Value  Date/Time            Glucose (POC)  100  06/03/2021 06:47 AM       Glucose (POC)  89  03/27/2016 11:54 AM       Glucose (POC)  106 (H)  03/27/2016 08:31 AM       Glucose (POC)  97  03/26/2016 01:21 PM            Glucose (POC)  111 (H)  12/17/2015 07:02 AM          Lab Results         Component  Value  Date/Time            Color  YELLOW/STRAW  05/26/2021 11:22 AM       Appearance  CLEAR  05/26/2021 11:22 AM       Specific gravity  1.017  05/26/2021 11:22 AM       Specific gravity  >1.030 (H)  04/10/2017 07:05 PM       pH (UA)  8.0  05/26/2021 11:22 AM       Protein  Negative  05/26/2021 11:22  AM       Glucose  Negative  05/26/2021 11:22 AM       Ketone  Negative  05/26/2021 11:22 AM       Bilirubin  Negative  05/26/2021 11:22 AM       Urobilinogen  0.2  05/26/2021 11:22 AM       Nitrites  Negative  05/26/2021 11:22 AM       Leukocyte Esterase  Negative  05/26/2021 11:22 AM       Epithelial cells  FEW  05/26/2021 11:22 AM       Bacteria  Negative  05/26/2021 11:22 AM       WBC  0-4  05/26/2021 11:22 AM            RBC  0-5  05/26/2021 11:22 AM                Medications Reviewed:          Current Facility-Administered Medications          Medication  Dose  Route  Frequency           ?  0.9% sodium chloride infusion   75 mL/hr  IntraVENous  CONTINUOUS     ?  diazePAM (VALIUM) tablet 5 mg   5 mg  Oral  DAILY WITH BREAKFAST     ?  diazePAM (VALIUM) tablet 10 mg   10 mg  Oral  QHS     ?  rOPINIRole (REQUIP) tablet 1 mg   1 mg  Oral  BID     ?  rOPINIRole (REQUIP) tablet 2 mg   2 mg  Oral  QHS           ?  ondansetron (ZOFRAN ODT) tablet 4 mg   4 mg  Oral  Q6H PRN           ?  aspirin chewable tablet 81 mg   81 mg  Oral  DAILY     ?  buPROPion SR (WELLBUTRIN SR) tablet 150 mg   150 mg  Oral  DAILY     ?  escitalopram oxalate (LEXAPRO) tablet 20 mg   20 mg  Oral  QHS     ?  gabapentin enacarbil TbER 600 mg (Patient Supplied)   600 mg  Oral  BID     ?  pantoprazole (PROTONIX) tablet 20 mg   20 mg  Oral  ACB&D     ?  cholecalciferol (VITAMIN D3) (1000 Units /25 mcg) tablet 5,000 Units   5,000 Units  Oral  DAILY     ?  sodium chloride (NS) flush 5-40 mL   5-40 mL  IntraVENous  Q8H     ?  sodium chloride (NS) flush 5-40 mL   5-40 mL  IntraVENous  PRN     ?  acetaminophen (TYLENOL) tablet 1,000 mg   1,000 mg  Oral  Q6H     ?  oxyCODONE IR (ROXICODONE) tablet 5 mg   5 mg  Oral  Q3H PRN     ?  oxyCODONE IR (ROXICODONE) tablet 10 mg   10 mg  Oral  Q3H PRN     ?  naloxone (NARCAN) injection 0.4 mg   0.4 mg  IntraVENous  PRN     ?  senna-docusate (PERICOLACE) 8.6-50 mg per tablet 1 Tablet   1 Tablet  Oral  BID     ?   polyethylene glycol (MIRALAX) packet 17 g   17 g  Oral  DAILY     ?  bisacodyL (DULCOLAX) suppository 10 mg   10 mg  Rectal  DAILY PRN     ?  phenol throat spray (CHLORASEPTIC) 1 Spray   1 Spray  Oral  PRN     ?  benzocaine-menthoL (CEPACOL) lozenge 1 Lozenge   1 Lozenge  Oral  PRN           ?  cyclobenzaprine (FLEXERIL) tablet 10 mg   10 mg  Oral  BID PRN        ______________________________________________________________________   EXPECTED LENGTH OF STAY: 2d 16h   ACTUAL LENGTH OF STAY:          5                    Kosisochukwu Burningham V, NP

## 2021-06-09 ENCOUNTER — Encounter

## 2021-06-09 MED ORDER — NALOXONE 4 MG/ACTUATION NASAL SPRAY
4 mg/actuation | NASAL | 0 refills | Status: AC
Start: 2021-06-09 — End: ?

## 2021-06-09 MED ORDER — OXYCODONE 5 MG TAB
5 mg | ORAL_TABLET | ORAL | 0 refills | Status: AC | PRN
Start: 2021-06-09 — End: 2021-06-14

## 2021-06-09 MED FILL — OXYCODONE 5 MG TAB: 5 mg | ORAL | Qty: 1

## 2021-06-09 MED FILL — BUPROPION SR 150 MG TAB: 150 mg | ORAL | Qty: 1

## 2021-06-09 MED FILL — CHILDREN'S ASPIRIN 81 MG CHEWABLE TABLET: 81 mg | ORAL | Qty: 1

## 2021-06-09 MED FILL — MIRALAX 17 GRAM ORAL POWDER PACKET: 17 gram | ORAL | Qty: 1

## 2021-06-09 MED FILL — ACETAMINOPHEN 500 MG TAB: 500 mg | ORAL | Qty: 2

## 2021-06-09 MED FILL — PANTOPRAZOLE 20 MG TAB, DELAYED RELEASE: 20 mg | ORAL | Qty: 1

## 2021-06-09 MED FILL — ROPINIROLE 1 MG TAB: 1 mg | ORAL | Qty: 1

## 2021-06-09 MED FILL — DIAZEPAM 10 MG TAB: 10 mg | ORAL | Qty: 1

## 2021-06-09 MED FILL — DIAZEPAM 5 MG TAB: 5 mg | ORAL | Qty: 1

## 2021-06-09 MED FILL — ESCITALOPRAM 10 MG TAB: 10 mg | ORAL | Qty: 2

## 2021-06-09 MED FILL — ROPINIROLE 1 MG TAB: 1 mg | ORAL | Qty: 2

## 2021-06-09 MED FILL — CHOLECALCIFEROL (VITAMIN D3) 1,000 UNIT (25 MCG) TAB: ORAL | Qty: 5

## 2021-06-09 MED FILL — STIMULANT LAXATIVE PLUS 8.6 MG-50 MG TABLET: ORAL | Qty: 1

## 2021-06-09 NOTE — Progress Notes (Signed)
Transition of Care Plan  RUR- Low 4%   DISPOSITION: The disposition plan is home with family assistance and HH  HH: [facility]; AVS updated  PT/SN  F/U with PCP/Specialist    Transport: Family     Medicare pt has received, reviewed, and signed 2nd IM letter informing them of their right to appeal the discharge.  Signed copy has been placed on pt bedside chart.      CM: DERWIN Ned Clines MSW,   [phone]

## 2021-06-09 NOTE — Progress Notes (Signed)
Problem: Patient Education: Go to Patient Education Activity  Goal: Patient/Family Education  06/09/2021 1509 by Siri Cole, RN  Outcome: Resolved/Met  06/09/2021 1508 by Siri Cole, RN  Outcome: Progressing Towards Goal

## 2021-06-09 NOTE — Discharge Summary (Signed)
Procedure(s):  T10 - L2 POSTERIOR SPINAL FUSION WITH T11 - T12 THORACIC LAMINECTOMY (MAZOR/O-ARM) WITH OSTEOTOMIES T11, T12, L1 Operative Report      Date of Surgery: 06/03/2021     Preoperative Diagnosis:  Back pain, unspecified back location, unspecified back pain laterality, unspecified chronicity [M54.9]    Postoperative Diagnosis: Back pain, unspecified back location, unspecified back pain laterality, unspecified chronicity [M54.9]    Procedure: Procedure(s):  T10 - L2 POSTERIOR SPINAL FUSION WITH T11 - T12 THORACIC LAMINECTOMY (MAZOR/O-ARM) WITH OSTEOTOMIES T11, T12, L1     Surgeon: Kem Parkinson, MD    History and Hospital Course:  Lindsay [name] is a pleasant 65 y.o. year old female who has complaints of chronic back pain.  Diagnostic testing found at T12 compression/burst fracture with instability.  Having failed conservative treatment, the patient elected to undergo operative intervention.  She tolerated the procedure well and was admitted post-operatively for antibiotics and pain control.     On post-op day 1, the patient was doing well.  She had some complaints of lower back pain but no leg pain.  Blood pressure issues and nausea were the main complaints.  She was started on a clear liquid diet.  She was allowed to dangle on the edge of the bed with physical therapy.  PCA was continued.  IV antibiotics were continued.      On post-op day 2, the patient continued to progress well.  She was started on a regular diet.  The PCA was discontinued and the patient was started on oral pain medications.  She was allowed to started getting out of bed with physical therapy.  She continued to struggle with nausea and hypotension and was followed by the hospitalist team during the remainder of the hospitalizaton.    On post-op day 7, the patient was deemed ready for discharge.  She was discharged to rehab.  She was tolerating a regular diet and pain was well controlled with oral pain medications.  The patient was  discharged with prescriptions for pain medications.  She was instructed to wear her brace at all times when out of bed.  She was instructed to limit her bending, lifting and twisting.  The patient will follow-up in 10-14 days for repeat x-rays and a wound check.      Signed By: Kem Parkinson, MD

## 2021-06-09 NOTE — Progress Notes (Signed)
Orthopedic Spine Progress Note  Post Op day: 6 Days Post-Op    June 09, 2021 8:32 AM   Admit Date: 06/03/2021  Procedure: Procedure(s):  T10 - L2 POSTERIOR SPINAL FUSION WITH T11 - T12 THORACIC LAMINECTOMY (MAZOR/O-ARM) WITH OSTEOTOMIES T11, T12, L1    Subjective:     Lindsay [name] has no complaints. She states she is feeling much better today.   + BM yesterday  Patient has been voiding without issue.   Tolerating diet.  No N/V.    Pain Control:   Pain Assessment  Pain Scale 1: Numeric (0 - 10)  Pain Intensity 1: 3  Pain Onset 1: post op  Pain Location 1: Back  Pain Orientation 1: Mid  Pain Description 1: Aching  Pain Intervention(s) 1: Medication (see MAR)    Objective:          Physical Exam:  General:  Alert and oriented. No acute distress.   Heart:  Respirations unlabored.   Abdomen:   Extremities: Soft, non-tender.  No evidence of cyanosis. Pulses palpable in both upper and lower extremities.       Neurologic:  Musculoskeletal:  No new motor deficits. Neurovascular exam within normal limits.  Sensation stable.  Motor: unchanged C5-T1 and L2-S1.   Homan's sign negative in bilateral lower extremities.  Calves soft, nontender upon palpation and with passive twitch.  Moves both upper and lower extremities.   Incision: clean, dry, and intact.  No significant erythema or swelling.  No active drainage noted.         Vital Signs:    Blood pressure 112/69, pulse 65, temperature 98.2 ??F (36.8 ??C), resp. rate 15, height 5' 2.5" (1.588 m), weight 148 lb 5.9 oz (67.3 kg), SpO2 91 %.  Temp (24hrs), Avg:98.3 ??F (36.8 ??C), Min:97.4 ??F (36.3 ??C), Max:99.4 ??F (37.4 ??C)      LAB:    No results for input(s): HCT, HGB, PLT, HCTEXT, HGBEXT, PLTEXT in the last 72 hours.  Lab Results   Component Value Date/Time    Sodium 138 06/06/2021 02:50 AM    Potassium 3.4 (L) 06/06/2021 02:50 AM    Chloride 104 06/06/2021 02:50 AM    CO2 29 06/06/2021 02:50 AM    Glucose 117 (H) 06/06/2021 02:50 AM    BUN 10 06/06/2021 02:50 AM    Creatinine  0.54 (L) 06/06/2021 02:50 AM    Calcium 8.4 (L) 06/06/2021 02:50 AM       Intake/Output:No intake/output data recorded.  12/04 1901 - 12/06 0700  In: -   Out: 1250 [Urine:1250]    PT/OT:   Gait:  Gait  Speed/Cadence: Slow, Pace decreased (<100 feet/min)  Step Length: Left shortened, Right shortened  Gait Abnormalities: Decreased step clearance, Antalgic  Ambulation - Level of Assistance: Supervision  Distance (ft): 300 Feet (ft)  Assistive Device: Gait belt, Brace/Splint, Walker, rolling  Rail Use: Left  (CGA-min A R UE HHA)  Stairs - Level of Assistance: Minimum assistance, Contact guard assistance  Number of Stairs Trained: 13                 Assessment:   Patient is 6 Days Post-Op s/p Procedure(s):  T10 - L2 POSTERIOR SPINAL FUSION WITH T11 - T12 THORACIC LAMINECTOMY (MAZOR/O-ARM) WITH OSTEOTOMIES T11, T12, L1    Plan:     1.  Continue PT/OT  2.  Continue established methods of pain control  3.  VTE Prophylaxes - TEDS &/or SCDs   4.  Advance diet  5.  Possible prevena dressing prior to discharge if persistent drainage  6.  Discharge pending clearance from PT; likely later today       Signed By: Frazier Richards, PA

## 2021-06-09 NOTE — Progress Notes (Signed)
Spine Surgery Progress Note    Admit Date: 06/03/2021   LOS: 6 days      Daily Progress Note: 06/09/2021    POD:6 Days Post-Op    S/P: Procedure(s):  T10 - L2 POSTERIOR SPINAL FUSION WITH T11 - T12 THORACIC LAMINECTOMY (MAZOR/O-ARM) WITH OSTEOTOMIES T11, T12, L1    Visit Vitals  BP 132/70 (BP 1 Location: Right upper arm, BP Patient Position: At rest)   Pulse 69   Temp 98.5 ??F (36.9 ??C)   Resp 16   Ht 5' 2.5" (1.588 m)   Wt 67.3 kg (148 lb 5.9 oz)   SpO2 93%   BMI 26.70 kg/m??      Lab Results   Component Value Date/Time    HGB 8.8 (L) 06/06/2021 02:50 AM    INR 0.9 05/26/2021 11:22 AM       Patient doing well overall. No nausea or vomiting.  Pain well controlled on current medications.  No complaints today; pt states she feels much better.  She has cleared PT/OT.  Ambulating without issue.  Voiding without issue. +flatus  Denies nausea, vomiting, fever, chills, chest pain, dyspnea, headache.    Calves soft/NTTP Bilaterally.  Moving LE well.  Neurocirculatory exam WNL.  Motor and sensation intact.   Incisional bandage saturated with serosanguinous drainage    Plan:  -Pain control   -PT/OT  -Regular diet  -Bowel regimen  -Cont home meds  -Prevena to incision - wound care consult placed  -CM consult for [facility]     Discharge to home with [facility] today  All questions were answered. Follow up in Dr. Alyse Low office in 1 week, sooner if needed.    Ailene Ravel, PA

## 2021-06-09 NOTE — Progress Notes (Signed)
Progress  Notes by Thurza Kwiecinski, Celene Skeen, NP at 06/09/21 1142                Author: Judy Pimple, Celene Skeen, NP  Service: Internal Medicine  Author Type: Nurse Practitioner       Filed: 06/09/21 1144  Date of Service: 06/09/21 1142  Status: Attested           Editor: Kameisha Malicki, Celene Skeen, NP (Nurse Practitioner)  Cosigner: Teofilo Pod, MD at 06/20/21 [phone]          Attestation signed by Teofilo Pod, MD at 06/20/21 [phone]          I evaluated this patient, key findings were discussed with the Advanced Practice Provider (APP)   Key components of clinical history, HPI, social history, allergies, past medical history, past surgical history, physical exam and medical decision making were discussed   On exam   CVS: S1S2 heard   Skin: warm    key components of medical care were performed.   Compression fracture of T12 vertebra   Care plan discussed with APP, key components of MDM performed   Please see APP note for details. Agree with APP assessment and Plan       Teofilo Pod, MD                                                   [address] [facility]                                                                                              Hospitalist Progress Note   Rowe Pavy, NP   Answering service: [phone] OR [phone] from in house phone            Date of Service:  06/09/2021   NAME:  KENDY HASTON   DOB:  [birth year]   MRN:  [id number]           Admission Summary:        JORA GALLUZZO is a 65 y.o. female who presents with low blood pressure and HA.        We are consulted to see this patient who is postop day 3 of T10-L2 posterior spinal fusion with T11-T12 thoracic laminectomy with osteotomies T11, T12, L1.  This patient presented for follow-up outpatient  with orthopedic surgery.  Patient was seen for chronic back pain.  She is admitted to the orthopedic surgery service and is on postop day 3.  We were previously consulted for management of chronic medical conditions, but the patient has been stable  on  her chronic conditions of been controlled.  Apparently the patient about 2 days ago started having nausea and vomiting, along with headache.  She reports no changes in her eating habits.  She says that she has been up and out of bed to the chair.   She says that she started having nausea on Thursday and has had  about 1 episode of emesis in each of the last 2 days.  Along with this, she complains of headache.  She is currently on Tylenol and oxycodone and says that this is improved the headache.   Apparently today she was also hypotensive in the 80s and 90s systolic.  At the time of my exam the patient is normotensive.  She does report some weakness, expected after surgery, and says that her back incision has been leaking.  At the time of  my exam she does not complain of headache.  Patient does not complain of any lightheadedness, chest pain, shortness of breath, syncope, or other symptoms of hypotension.      Interval history / Subjective:        POD #6   Blood pressure improved.  Voiding moving bowels.  Good appetite pain controlled.  Possible rehab later today. Signing off, available if needed.              Assessment & Plan:          Compression fracture of T12 vertebra s/p T10-L2 posterior spinal fusion with T10-11 to T12 thoracic laminectomy with osteotomies T11, T12, L1 on 11/30.   PT/OT   Bowel regimen   Continue management per primary team       Hypotension   Apparently was 99/63 earlier   Cw ivf   12/4-no issues today, improved with fluids   12/5 asymptomatic, map > 65       Nausea and vomiting   Patient reports passing flatus   No bowel movements   Continue bowel regimen   12/3 KUB: no ileus/obstruction, constipation   12/4-much improved   Looks like it may have been some dehydration plus medications      Constipation   - bowel regimen in place       GERD   Continue current management       Hyperlipidemia   Anxiety/depression       Code status: Full   Prophylaxis: SCDs   Care Plan discussed with: Patient,  nurse   Anticipated Disposition: Per primary team           Hospital Problems   Date Reviewed: 05/27/2021                            Codes  Class  Noted  POA              Compression fracture of body of thoracic vertebra [facility])  ICD-10-CM: S22.000A   ICD-9-CM: 805.2    06/03/2021  Unknown                    Review of Systems:     A comprehensive review of systems was negative except for that written in the HPI.            Vital Signs:      Last 24hrs VS reviewed since prior progress note. Most recent are:   Visit Vitals      BP  112/69 (BP 1 Location: Left upper arm, BP Patient Position: At rest)     Pulse  70     Temp  98.2 ??F (36.8 ??C)     Resp  15     Ht  5' 2.5" (1.588 m)     Wt  67.3 kg (148 lb 5.9 oz)     SpO2  91%        BMI  26.70  kg/m??              Intake/Output Summary (Last 24 hours) at 06/09/2021 1142   Last data filed at 06/09/2021 0408     Gross per 24 hour        Intake  --        Output  700 ml        Net  -700 ml                 Physical Examination:        I had a face to face encounter with this patient and independently examined them on 06/09/2021 as outlined below:                Constitutional:   No acute distress, cooperative, pleasant      ENT:   Oral mucosa moist, oropharynx benign.      Resp:   CTA bilaterally. No wheezing/rhonchi/rales. No accessory muscle use.      CV:   Regular rhythm, normal rate, no murmurs, gallops, rubs      GI:   Soft, non distended, non tender. normoactive bowel sounds, no hepatosplenomegaly       Musculoskeletal:   No edema, warm, 2+ pulses throughout         Neurologic:   Moves all extremities.  AAOx3, CN II-XII reviewed                      Data Review:      Review and/or order of clinical lab test   Review and/or order of tests in the radiology section of CPT   Review and/or order of tests in the medicine section of CPT           Labs:        No results for input(s): WBC, HGB, HCT, PLT, HGBEXT, HCTEXT, PLTEXT, HGBEXT, HCTEXT, PLTEXT in the last 72 hours.      No  results for input(s): NA, K, CL, CO2, BUN, CREA, GLU, CA, MG, PHOS, URICA in the last 72 hours.      No results for input(s): ALT, AP, TBIL, TBILI, TP, ALB, GLOB, GGT, AML, LPSE in the last 72 hours.      No lab exists for component: SGOT, GPT, AMYP, HLPSE      No results for input(s): INR, PTP, APTT, INREXT, INREXT in the last 72 hours.    No results for input(s): FE, TIBC, PSAT, FERR in the last 72 hours.    No results found for: FOL, RBCF    No results for input(s): PH, PCO2, PO2 in the last 72 hours.   No results for input(s): CPK, CKNDX, TROIQ in the last 72 hours.      No lab exists for component: CPKMB     Lab Results         Component  Value  Date/Time            Cholesterol, total  222 (H)  06/04/2021 12:30 AM       HDL Cholesterol  57  06/04/2021 12:30 AM       LDL, calculated  121.6 (H)  06/04/2021 12:30 AM       Triglyceride  217 (H)  06/04/2021 12:30 AM            CHOL/HDL Ratio  3.9  06/04/2021 12:30 AM          Lab Results  Component  Value  Date/Time            Glucose (POC)  100  06/03/2021 06:47 AM       Glucose (POC)  89  03/27/2016 11:54 AM       Glucose (POC)  106 (H)  03/27/2016 08:31 AM       Glucose (POC)  97  03/26/2016 01:21 PM            Glucose (POC)  111 (H)  12/17/2015 07:02 AM          Lab Results         Component  Value  Date/Time            Color  YELLOW/STRAW  05/26/2021 11:22 AM       Appearance  CLEAR  05/26/2021 11:22 AM       Specific gravity  1.017  05/26/2021 11:22 AM       Specific gravity  >1.030 (H)  04/10/2017 07:05 PM       pH (UA)  8.0  05/26/2021 11:22 AM       Protein  Negative  05/26/2021 11:22 AM       Glucose  Negative  05/26/2021 11:22 AM       Ketone  Negative  05/26/2021 11:22 AM       Bilirubin  Negative  05/26/2021 11:22 AM       Urobilinogen  0.2  05/26/2021 11:22 AM       Nitrites  Negative  05/26/2021 11:22 AM       Leukocyte Esterase  Negative  05/26/2021 11:22 AM       Epithelial cells  FEW  05/26/2021 11:22 AM       Bacteria  Negative  05/26/2021  11:22 AM            WBC  0-4  05/26/2021 11:22 AM            RBC  0-5  05/26/2021 11:22 AM                Medications Reviewed:          Current Facility-Administered Medications          Medication  Dose  Route  Frequency           ?  0.9% sodium chloride infusion   75 mL/hr  IntraVENous  CONTINUOUS     ?  diazePAM (VALIUM) tablet 5 mg   5 mg  Oral  DAILY WITH BREAKFAST     ?  diazePAM (VALIUM) tablet 10 mg   10 mg  Oral  QHS     ?  rOPINIRole (REQUIP) tablet 1 mg   1 mg  Oral  BID     ?  rOPINIRole (REQUIP) tablet 2 mg   2 mg  Oral  QHS     ?  ondansetron (ZOFRAN ODT) tablet 4 mg   4 mg  Oral  Q6H PRN     ?  aspirin chewable tablet 81 mg   81 mg  Oral  DAILY     ?  buPROPion SR (WELLBUTRIN SR) tablet 150 mg   150 mg  Oral  DAILY     ?  escitalopram oxalate (LEXAPRO) tablet 20 mg   20 mg  Oral  QHS     ?  gabapentin enacarbil TbER 600 mg (Patient Supplied)   600 mg  Oral  BID     ?  pantoprazole (PROTONIX) tablet 20  mg   20 mg  Oral  ACB&D     ?  cholecalciferol (VITAMIN D3) (1000 Units /25 mcg) tablet 5,000 Units   5,000 Units  Oral  DAILY     ?  sodium chloride (NS) flush 5-40 mL   5-40 mL  IntraVENous  Q8H     ?  sodium chloride (NS) flush 5-40 mL   5-40 mL  IntraVENous  PRN           ?  acetaminophen (TYLENOL) tablet 1,000 mg   1,000 mg  Oral  Q6H           ?  oxyCODONE IR (ROXICODONE) tablet 5 mg   5 mg  Oral  Q3H PRN     ?  oxyCODONE IR (ROXICODONE) tablet 10 mg   10 mg  Oral  Q3H PRN     ?  naloxone (NARCAN) injection 0.4 mg   0.4 mg  IntraVENous  PRN     ?  senna-docusate (PERICOLACE) 8.6-50 mg per tablet 1 Tablet   1 Tablet  Oral  BID     ?  polyethylene glycol (MIRALAX) packet 17 g   17 g  Oral  DAILY     ?  bisacodyL (DULCOLAX) suppository 10 mg   10 mg  Rectal  DAILY PRN     ?  phenol throat spray (CHLORASEPTIC) 1 Spray   1 Spray  Oral  PRN     ?  benzocaine-menthoL (CEPACOL) lozenge 1 Lozenge   1 Lozenge  Oral  PRN           ?  cyclobenzaprine (FLEXERIL) tablet 10 mg   10 mg  Oral  BID PRN         ______________________________________________________________________   EXPECTED LENGTH OF STAY: 2d 16h   ACTUAL LENGTH OF STAY:          6                    Magdalene Tardiff V, NP

## 2021-06-10 MED ORDER — ATORVASTATIN 10 MG TAB
10 mg | ORAL_TABLET | ORAL | 1 refills | Status: AC
Start: 2021-06-10 — End: ?

## 2021-06-11 ENCOUNTER — Encounter

## 2021-06-11 MED ORDER — DIAZEPAM 5 MG TAB
5 mg | ORAL_TABLET | ORAL | 0 refills | Status: DC
Start: 2021-06-11 — End: 2021-07-13

## 2021-06-11 NOTE — Telephone Encounter (Signed)
Patient would like a refill of diazePAM. PSR can see where an order is pending but is sending this message to the nurse for verification purposes.

## 2021-06-18 ENCOUNTER — Ambulatory Visit
Admit: 2021-06-18 | Discharge: 2021-06-18 | Payer: MEDICARE | Attending: Physician Assistant | Primary: Internal Medicine

## 2021-06-18 ENCOUNTER — Encounter: Admit: 2021-06-18 | Payer: MEDICARE | Primary: Internal Medicine

## 2021-06-18 DIAGNOSIS — Z981 Arthrodesis status: Secondary | ICD-10-CM

## 2021-06-18 MED ORDER — OXYCODONE 5 MG TAB
5 mg | ORAL_TABLET | ORAL | 0 refills | Status: AC | PRN
Start: 2021-06-18 — End: 2021-06-25

## 2021-06-18 MED ORDER — ROPINIROLE 1 MG TAB
1 mg | ORAL_TABLET | ORAL | 2 refills | Status: AC
Start: 2021-06-18 — End: ?

## 2021-06-18 NOTE — Telephone Encounter (Signed)
Last refill for Requip from Dr. Alison Murray. Has fu with Dr. Lauree Chandler in July, 2023

## 2021-06-18 NOTE — Progress Notes (Signed)
Lindsay [name] (DOB: 30-Dec-1955) is a 65 y.o. female, established patient, here for evaluation of the following chief complaint(s):  Surgical Follow-up         SUBJECTIVE/OBJECTIVE:    Lindsay [name] (DOB: 1956-06-09) is a 65 y.o. female.    T10 - L2 Posterior Spinal Fusion With T11 - T12 Thoracic Laminectomy (mazor/o-arm) With Osteotomies T11, T12, L1 06/03/2021     The patient states that symptoms are gradually improving.  He continues to have moderate postoperative pain and is taking Haldol and oxycodone 5 mg every 5 hours for pain relief.  Overall, patient states she is doing better.    No Known Allergies    Current Outpatient Medications   Medication Sig    oxyCODONE IR (ROXICODONE) 5 mg immediate release tablet Take 5 mg by mouth every four (4) hours as needed for Pain.    acetaminophen (TylenoL) 325 mg tablet Take  by mouth every four (4) hours as needed for Pain.    oxyCODONE IR (ROXICODONE) 5 mg immediate release tablet Take 1 Tablet by mouth every four (4) hours as needed for Pain for up to 7 days. Max Daily Amount: 30 mg.    diazePAM (VALIUM) 5 mg tablet TAKE 1 TABLET BY MOUTH IN THE MORNING AND 2 TABLETS IN THE EVENING DAILY    atorvastatin (LIPITOR) 10 mg tablet TAKE 1 TABLET BY MOUTH EVERY DAY    naloxone (Narcan) 4 mg/actuation nasal spray Use 1 spray intranasally, then discard. Repeat with new spray every 2 min as needed for opioid overdose symptoms, alternating nostrils.    buPROPion XL (WELLBUTRIN XL) 150 mg tablet Take 1 Tablet by mouth every morning.    escitalopram oxalate (LEXAPRO) 10 mg tablet Take 1 Tablet by mouth nightly. (Patient taking differently: Take 20 mg by mouth nightly.)    rOPINIRole (REQUIP) 1 mg tablet TAKE 1 TABLET AT 1-2PM AND TAKE 3 TABLETS BY MOUTH 1 TO 3 HOURS BEFORE BEDTIME EVERYDAY    gabapentin enacarbil (Horizant) 600 mg TbER TAKE 1 TABLET BY MOUTH TWICE A DAY    Vitamin D3 125 mcg (5,000 unit) tab tablet TAKE 1 TABLET BY MOUTH DAILY    omeprazole (PRILOSEC OTC) 20 mg  tablet Take 20 mg by mouth every evening.    aspirin 81 mg chewable tablet Take 1 Tab by mouth daily.     No current facility-administered medications for this visit.       Social History     Socioeconomic History    Marital status: MARRIED     Spouse name: Not on file    Number of children: Not on file    Years of education: Not on file    Highest education level: Not on file   Occupational History    Not on file   Tobacco Use    Smoking status: Never    Smokeless tobacco: Never   Vaping Use    Vaping Use: Never used   Substance and Sexual Activity    Alcohol use: No     Comment: Pt denies    Drug use: No    Sexual activity: Yes     Partners: Male   Other Topics Concern    Not on file   Social History Narrative    Not on file     Social Determinants of Health     Financial Resource Strain: Not on file   Food Insecurity: Not on file   Transportation Needs: Not on file  Physical Activity: Not on file   Stress: Not on file   Social Connections: Not on file   Intimate Partner Violence: Not on file   Housing Stability: Not on file       Past Surgical History:   Procedure Laterality Date    HX APPENDECTOMY      HX CESAREAN SECTION      x4    HX GI      COLONOSCOPY    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 shoulder    HX ORTHOPAEDIC      left ankle fracture treated with air cast    HX ORTHOPAEDIC  12/2011    left shoulder fxt and dislocation    HX OTHER SURGICAL  2010    left foot torn tendon    HX TUBAL LIGATION      IMPLANT BREAST SILICONE/EQ  2002    Bilateral breast implants    IR KYPHOPLASTY THORACIC  12/12/2019    PR BREAST SURGERY PROCEDURE UNLISTED  2002    AUGMENTATION       Family History   Problem Relation Age of Onset    Heart Disease Mother     Heart Disease Father     Cancer Father         thyroid cancer    Arthritis-rheumatoid Sister     Other Brother         COVID    No Known Problems Daughter     No Known Problems Daughter     No Known Problems Daughter      No Known Problems Son     Anesth Problems Neg Hx           REVIEW OF SYSTEMS:    Patient denies any recent fever, chills, nausea, vomiting, chest pain, abdominal pain, blurred vision, dizziness or shortness of breath.      Vitals:    Ht 5\' 3"  (1.6 m)    Wt 145 lb (65.8 kg)    BMI 25.69 kg/m??    Body mass index is 25.69 kg/m??.       PHYSICAL EXAM:    The patient is alert and oriented x3 and in no acute distress. Patient ambulates with the support of a walker.  The surgical wound appears to be well-healed.  There is a scant amount of serous drainage at the most inferior portion of the wound.  There is no evidence of odor or erythema.  Sensory testing in all major nerve distributions in the lower extremities is intact and symmetric. Manual motor testing of the major muscle groups of the lower extremities including dorsiflexion/EHL/ plantarflexion, knee flexion/extension, hip flexion/extension/abduction/ adduction is intact and symmetric in the bilateral lower extremities with the exception of mild hip flexion weakness graded 4/5. No evidence of clonus bilaterally. Babinski's downgoing and symmetric bilaterally.  No calf tenderness.  Negative Homans.      IMAGING:    Results from Appointment encounter on 06/18/21    XR SPINE LUMB 2 OR 3 V    Narrative  AP view digital radiographs of the lumbar spine obtained in the office today were reviewed and demonstrate good alignment of posterior instrumentation T10-L2  with no evidence of hardware loosening, subsidence or failure.         Orders Placed This Encounter    XR SPINE LUMB 2 OR 3 V     S5     Standing Status:  Future     Number of Occurrences:   1     Standing Expiration Date:   06/19/2022    oxyCODONE IR (ROXICODONE) 5 mg immediate release tablet     Sig: Take 1 Tablet by mouth every four (4) hours as needed for Pain for up to 7 days. Max Daily Amount: 30 mg.     Dispense:  42 Tablet     Refill:  0          ASSESSMENT/PLAN:      1. S/P spinal fusion  -     XR SPINE  LUMB 2 OR 3 V; Future  -     oxyCODONE IR (ROXICODONE) 5 mg immediate release tablet; Take 1 Tablet by mouth every four (4) hours as needed for Pain for up to 7 days. Max Daily Amount: 30 mg., Normal, Disp-42 Tablet, R-0        Below is the assessment and plan developed based on review of pertinent history, physical exam, labs, studies, and medications.    Have discussed radiographic findings and have answered all patient questions to her satisfaction.  Have instructed the patient to continue daily dressing changes and keep the wound covered with a sterile dressing as long as she has wound drainage.  The drainage should gradually subside over the next 7 to 10 days.  The patient is to continue use of TLSO brace when out of bed and is to continue ambulation with support of a rolling walker.  Have encouraged the patient to continue walking program as tolerated.  Prescription for refill on patient's main medication was sent to her pharmacy electronically.  I will see the patient back for reevaluation with Dr. Raylene Miyamoto in 4 weeks time, sooner should she develop any surgery related complications.  The patient was asked to contact the office with any questions or concerns. The patient understands and agrees to the treatment plan as outlined above.       Return for post op follow up.       Dr. Raylene Miyamoto was available for immediate consultation as needed.     An electronic signature was used to authenticate this note.  -- Haskel Schroeder, PA-C

## 2021-06-18 NOTE — Telephone Encounter (Signed)
Requesting refill on:    Requested Prescriptions     Pending Prescriptions Disp Refills    rOPINIRole (REQUIP) 1 mg tablet [Pharmacy Med Name: ROPINIROLE HCL 1 MG TABLET] 360 Tablet 0     Sig: TAKE 1 TABLET AT 1-2PM AND TAKE 3 TABLETS BY MOUTH 1 TO 3 HOURS BEFORE BEDTIME EVERYDAY

## 2021-06-19 ENCOUNTER — Encounter

## 2021-06-19 NOTE — Telephone Encounter (Signed)
Requesting refill on:    Requested Prescriptions     Pending Prescriptions Disp Refills    gabapentin enacarbil (Horizant) 600 mg TbER 180 Tablet 1     Sig: Take 1 Tablet by mouth two (2) times a day.

## 2021-07-03 ENCOUNTER — Encounter

## 2021-07-03 MED ORDER — OXYCODONE 5 MG TAB
5 mg | ORAL_TABLET | ORAL | 0 refills | Status: AC | PRN
Start: 2021-07-03 — End: 2021-07-10

## 2021-07-03 NOTE — Telephone Encounter (Signed)
PMP checked.

## 2021-07-11 ENCOUNTER — Encounter

## 2021-07-13 MED ORDER — DIAZEPAM 5 MG TAB
5 mg | ORAL_TABLET | ORAL | 0 refills | Status: DC
Start: 2021-07-13 — End: 2021-08-12

## 2021-07-15 ENCOUNTER — Encounter

## 2021-07-15 MED ORDER — BUPROPION XL 150 MG 24 HR TAB
150 mg | ORAL_TABLET | ORAL | 1 refills | Status: AC
Start: 2021-07-15 — End: ?

## 2021-07-15 MED ORDER — ESCITALOPRAM 10 MG TAB
10 mg | ORAL_TABLET | Freq: Every evening | ORAL | 1 refills | Status: AC
Start: 2021-07-15 — End: ?

## 2021-07-21 ENCOUNTER — Encounter: Admit: 2021-07-21 | Payer: MEDICARE | Primary: Internal Medicine

## 2021-07-21 ENCOUNTER — Ambulatory Visit: Admit: 2021-07-21 | Discharge: 2021-07-21 | Payer: MEDICARE | Attending: Specialist | Primary: Internal Medicine

## 2021-07-21 ENCOUNTER — Ambulatory Visit: Attending: Specialist | Primary: Internal Medicine

## 2021-07-21 DIAGNOSIS — Z981 Arthrodesis status: Secondary | ICD-10-CM

## 2021-07-21 NOTE — Progress Notes (Signed)
Lindsay [name] (DOB: [birth year]) is a 66 y.o. female, patient, here for evaluation of the following chief complaint(s):  Surgical Follow-up       ASSESSMENT/PLAN:  Below is the assessment and plan developed based on review of pertinent history, physical exam, labs, studies, and medications.  Patient presents today approximately 6 weeks status post recent thoracolumbar fusion.  Radiologic findings reviewed in detail with the patient.  She continues to progress very well overall.  She did have an increase in some right lateral hip and anterior thigh pain, but this is continuing to improve.  She will continue to wean off of the oxycodone as tolerated.  She will remain in her brace until her next visit.  Encouraged the patient to continue with regular walking.  No incisional difficulties noted today.  She will call our office with any worsening of her symptoms, otherwise she will follow-up in about 6 weeks, at which time we may begin outpatient physical therapy.    1. S/P lumbar fusion  -     XR SPINE LUMB 2 OR 3 V; Future      No follow-ups on file.      SUBJECTIVE/OBJECTIVE:  Lindsay [name] (DOB: 23-Apr-1956) is a 66 y.o. female.    Pain Assessment  07/21/2021   Location of Pain Back   Location Modifiers Posterior   Severity of Pain 4        Patient presents is 6 weeks status post recent thoracolumbar fusion.  She continues to progress very well overall.  She has been compliant with wearing her brace as well as her bending, lifting and twisting restrictions.  She denies any injury or trauma, but she did have an increase in right lateral hip and anterior thigh pain.  This is progressively improving.  She is walking regularly.  She is down to taking 1 oxycodone in the afternoon if needed.  Denies any other pain numbness tingling going down her legs.  She does have some bilateral low back soreness.  No incisional difficulties, no fevers or chills.  No acute changes in bowel or bladder control.    Imaging:    XR Results (most  recent):  Results from Appointment encounter on 07/21/21    XR SPINE LUMB 2 OR 3 V    Narrative  AP and lateral of the thoracic spine reviewed today.  Stable T10-L2 fusion.  Stable alignment is maintained.         No Known Allergies    Current Outpatient Medications   Medication Sig    buPROPion XL (WELLBUTRIN XL) 150 mg tablet Take 1 Tablet by mouth every morning.    escitalopram oxalate (LEXAPRO) 10 mg tablet Take 1 Tablet by mouth nightly.    diazePAM (VALIUM) 5 mg tablet TAKE 1 TABLET BY MOUTH IN THE MORNING AND 2 TABLETS IN THE EVENING DAILY    rOPINIRole (REQUIP) 1 mg tablet TAKE 1 TABLET AT 1-2PM AND TAKE 3 TABLETS BY MOUTH 1 TO 3 HOURS BEFORE BEDTIME EVERYDAY    acetaminophen (TYLENOL) 325 mg tablet Take  by mouth every four (4) hours as needed for Pain.    atorvastatin (LIPITOR) 10 mg tablet TAKE 1 TABLET BY MOUTH EVERY DAY    gabapentin enacarbil (Horizant) 600 mg TbER TAKE 1 TABLET BY MOUTH TWICE A DAY    Vitamin D3 125 mcg (5,000 unit) tab tablet TAKE 1 TABLET BY MOUTH DAILY    omeprazole (PRILOSEC OTC) 20 mg tablet Take 20 mg by mouth every evening.  aspirin 81 mg chewable tablet Take 1 Tab by mouth daily.    naloxone (Narcan) 4 mg/actuation nasal spray Use 1 spray intranasally, then discard. Repeat with new spray every 2 min as needed for opioid overdose symptoms, alternating nostrils. (Patient not taking: Reported on 07/21/2021)     No current facility-administered medications for this visit.       Past Medical History:   Diagnosis Date    Abnormal brain scan     Anxiety disorder     Arthritis     Chronic pain     Depression     DVT (deep venous thrombosis) (HCC)     Falls frequently     Fracture of left ankle     Headache(784.0) 2011    Ill-defined condition 09/15/2015    FX R HAND    Neurocutaneous syndrome (HCC)     Other and unspecified hyperlipidemia 12/27/2012    PE (pulmonary embolism) 12/2012    RLS (restless legs syndrome) 03/16/2016    Dr Scarlett PrestoMary Ransom    TIA (transient ischemic attack) 03/2016     Right facial droop, numbness in lip    Torn rotator cuff     right        Past Surgical History:   Procedure Laterality Date    HX APPENDECTOMY      HX CESAREAN SECTION      x4    HX GI      COLONOSCOPY    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 shoulder    HX ORTHOPAEDIC      left ankle fracture treated with air cast    HX ORTHOPAEDIC  12/2011    left shoulder fxt and dislocation    HX OTHER SURGICAL  2010    left foot torn tendon    HX TUBAL LIGATION      IMPLANT BREAST SILICONE/EQ  2002    Bilateral breast implants    IR KYPHOPLASTY THORACIC  12/12/2019    PR UNLISTED PROCEDURE BREAST  2002    AUGMENTATION       Family History   Problem Relation Age of Onset    Heart Disease Mother     Heart Disease Father     Cancer Father         thyroid cancer    Arthritis-rheumatoid Sister     Other Brother         COVID    No Known Problems Daughter     No Known Problems Daughter     No Known Problems Daughter     No Known Problems Son     Anesth Problems Neg Hx         Social History     Tobacco Use    Smoking status: Never    Smokeless tobacco: Never   Vaping Use    Vaping Use: Never used   Substance Use Topics    Alcohol use: No     Comment: Pt denies    Drug use: No        Review of Systems   Constitutional:  Negative for chills and fever.   Musculoskeletal:  Positive for back pain and gait problem.   Neurological:  Negative for weakness.   All other systems reviewed and are negative.       Vitals:  Ht 5\' 3"  (1.6 m)    Wt 145 lb (65.8 kg)    BMI 25.69  kg/m??    Body mass index is 25.69 kg/m??.    Physical Exam  Integumentary  Assessment of Surgical Incision - healing and consistent with normal anticipated wound healing.    Neurologic  Sensory  Light Touch - Intact - Globally.  Overall Assessment of Muscle Strength and Tone reveals  Lower Extremities - Right Iliopsoas - 5/5. Left Iliopsoas - 5/5. Right Tibialis Anterior - 5/5. Left Tibialis Anterior - 5/5. Right  Gastroc-Soleus - 5/5. Left Gastroc-Soleus - 5/5. Right EHL - 5/5. Left EHL - 5/5.  General Assessment of Reflexes  Right Ankle - Clonus is not present. Left Ankle - Clonus is not present.  Reflexes (Dermatomes)  2/2 Normal - Left Achilles (L5-S2), Left Knee (L2-4), Right Achilles (L5-S2) and Right Knee (L2-4).    Musculoskeletal  Global Assessment  Examination of related systems reveals - well-developed, well-nourished, in no acute distress, alert and oriented x 3 and normal coordination.  Spine/Ribs/Pelvis  Lumbosacral Spine - Examination of the lumbosacral spine reveals - no known fractures or deformities. Inspection and Palpation - Tenderness - moderate. Assessment of pain reveals the following findings - The pain is characterized as - moderate. Location - pain refers to lower back bilaterally.      Dr. Raylene Miyamoto was available for immediate consult during this encounter.  An electronic signature was used to authenticate this note.  -- Frazier Richards, PA

## 2021-07-21 NOTE — Progress Notes (Signed)
Done in office today.

## 2021-07-27 ENCOUNTER — Encounter: Attending: Internal Medicine | Primary: Internal Medicine

## 2021-07-27 NOTE — Telephone Encounter (Signed)
Patient called to state she has had to cancel her appointment for today with Dr. Darl Pikes. Patient states she had back surgery recently on 11/29 and fell on Saturday and her back has been hurting ever since. Patient will not be able to make it in today.    PSR has cancelled her appointment.    Patient also wanted to let the doctor know the depression and anxiety medication is working very well.    Her medication follow up appointment has been rescheduled for 2/8 at 9:30. Patient would like to know if she can be seen later in the day as she needs transportation to get here. Please call back and advise.

## 2021-07-27 NOTE — Telephone Encounter (Signed)
Returned call to patient. She wanted to inform her surgery went well & all her hardware is in place. She did suffer a fall on Saturday while trying to do some house work. She had on socks & not her slippers with the rubber grip. She states she slipped & fell softly to the floor. Her pain is improving each day. She will reach out to her surgeon to notify of the fall as well. Advised her visit on 2/8 can be virtual as PCP is following up on her mood meds. Patient voiced understanding. She had no further questions or concerns & was very thankful for the return call.

## 2021-08-12 ENCOUNTER — Telehealth: Admit: 2021-08-12 | Payer: MEDICARE | Attending: Internal Medicine | Primary: Internal Medicine

## 2021-08-12 ENCOUNTER — Telehealth: Attending: Internal Medicine | Primary: Internal Medicine

## 2021-08-12 DIAGNOSIS — F419 Anxiety disorder, unspecified: Secondary | ICD-10-CM

## 2021-08-12 DIAGNOSIS — F339 Major depressive disorder, recurrent, unspecified: Secondary | ICD-10-CM

## 2021-08-12 MED ORDER — DIAZEPAM 5 MG TAB
5 mg | ORAL_TABLET | ORAL | 4 refills | Status: AC
Start: 2021-08-12 — End: ?

## 2021-08-12 NOTE — Progress Notes (Signed)
Lindsay [name] (DOB: 1955/10/20) is a 66 y.o. female, established patient, here for evaluation of the following chief complaint(s):   Anxiety       ASSESSMENT/PLAN:  Below is the assessment and plan developed based on review of pertinent history, labs, studies, and medications.    1. Recurrent depression (HCC)-well managed on current meds  2. Recurrent pulmonary embolism (HCC)-no clots with recent spine surgery in nov  3. Medicare annual wellness visit, subsequent  4. History of spinal fusion-doing well encouraged taper off the narcotics and  use only tylenol prn   5. Anxiety  -     diazePAM (VALIUM) 5 mg tablet; 1 in am and 2 in pm, Normal, Disp-90 Tablet, R-4Not to exceed 5 additional fills before 12/08/2021      No follow-ups on file.    SUBJECTIVE/OBJECTIVE:  HPI  Since our last visit, Lindsay [name] did have spinal fusion with thoracic laminectomy, Dr. Raylene Miyamoto.  Surgery went well.  She is still in a full body brace, but pain is much improved, down to one pain pill occasionally.  Mostly managing with Tylenol.  For two weeks she has been back at work.  She is able to take her showers.  She is not bending and her daughters are doing the cleaning in her house.  Mood is very stable on current Lexapro 10, Wellbutrin 150 and Valium 5 mg, three tabs daily.  No cardiac symptoms and she had no complications of clotting after this surgery.  She is due for full labs and encouraged to come in June for this.  Reviewed preventive care.  Reviewed shingles shot, which she plans to get.        Review of Systems     Patient-Reported Weight: 145lb       Physical Exam    [INSTRUCTIONS:  "[x] " Indicates a positive item  "[] " Indicates a negative item  -- DELETE ALL ITEMS NOT EXAMINED]    Constitutional: [x]  Appears well-developed and well-nourished [x]  No apparent distress      []  Abnormal -     Mental status: [x]  Alert and awake  [x]  Oriented to person/place/time [x]  Able to follow commands    []  Abnormal -     Eyes:   EOM    [x]    Normal    []  Abnormal -   Sclera  [x]   Normal    []  Abnormal -          Discharge [x]   None visible   []  Abnormal -     HENT: [x]  Normocephalic, atraumatic  []  Abnormal -   [x]  Mouth/Throat: Mucous membranes are moist    External Ears [x]  Normal  []  Abnormal -    Neck: [x]  No visualized mass []  Abnormal -     Pulmonary/Chest: [x]  Respiratory effort normal   [x]  No visualized signs of difficulty breathing or respiratory distress        []  Abnormal -      Musculoskeletal:   [x]  Normal gait with no signs of ataxia         [x]  Normal range of motion of neck        []  Abnormal -     Neurological:        [x]  No Facial Asymmetry (Cranial nerve 7 motor function) (limited exam due to video visit)          [x]  No gaze palsy        []  Abnormal -  Skin:        [x]  No significant exanthematous lesions or discoloration noted on facial skin         []  Abnormal -            Psychiatric:       [x]  Normal Affect []  Abnormal -        [x]  No Hallucinations    Other pertinent observable physical exam findings:-    On this date 08/12/2021 I have spent 20 minutes reviewing previous notes, test results and face to face (virtual) with the patient discussing the diagnosis and importance of compliance with the treatment plan as well as documenting on the day of the visit.    Lindsay BillingsKathy A Mance, was evaluated through a synchronous (real-time) audio-video encounter. The patient (or guardian if applicable) is aware that this is a billable service, which includes applicable co-pays. This Virtual Visit was conducted with patient's (and/or legal guardian's) consent. The visit was conducted pursuant to the emergency declaration under the [employer] Act and the [employer] Emergencies Act, 1135 waiver authority and the [employer]onavirus Preparedness and [employer] Supplemental Appropriations Act.  Patient identification was verified, and a caregiver was present when appropriate.  The patient was located at: [place] [address]  [place] [id number]  The  provider was located at: Facility (Appt Department): [address]  [place] [id number]       An electronic signature was used to authenticate this note.  -- Clarene CritchleyFrancesca L Hearl Heikes, MD   This is the Subsequent Medicare Annual Wellness Exam, performed 12 months or more after the Initial AWV or the last Subsequent AWV    I have reviewed the patient's medical history in detail and updated the computerized patient record.        Assessment/Plan   Education and counseling provided:  Are appropriate based on today's review and evaluation  Influenza Vaccine  Screening Mammography  Bone mass measurement (DEXA)    1. Recurrent depression (HCC)  2. Recurrent pulmonary embolism (HCC)  3. Medicare annual wellness visit, subsequent  4. History of spinal fusion  5. Anxiety  -     diazePAM (VALIUM) 5 mg tablet; 1 in am and 2 in pm, Normal, Disp-90 Tablet, R-4Not to exceed 5 additional fills before 12/08/2021       Depression Risk Factor Screening:     3 most recent PHQ Screens 08/12/2021   PHQ Not Done -   Little interest or pleasure in doing things Several days   Feeling down, depressed, irritable, or hopeless Several days   Total Score PHQ 2 2       Alcohol & Drug Abuse Risk Screen    Do you average more than 1 drink per night or more than 7 drinks a week:  No    On any one occasion in the past three months have you have had more than 3 drinks containing alcohol:  No          Functional Ability and Level of Safety:    Hearing: Hearing is good.      Activities of Daily Living:  The home contains: no safety equipment. and shower chair  Patient needs help with:  laundry and housework      Ambulation: with mild difficulty     Fall Risk:  Fall Risk Assessment, last 12 mths 08/12/2021   Able to walk? Yes   Fall in past 12 months? 1   Do you feel unsteady? 0   Are you  worried about falling 0   Number of falls in past 12 months -   Fall with injury? -      Abuse Screen:  Patient is not abused       Cognitive Screening    Has your  family/caregiver stated any concerns about your memory: no    Cognitive Screening: Normal - Verbal Fluency Test    Health Maintenance Due     Health Maintenance Due   Topic Date Due    Shingles Vaccine (1 of 2) Never done    Medicare Yearly Exam  Never done    COVID-19 Vaccine (3 - Booster for Pfizer series) 03/30/2020    Flu Vaccine (1) 02/02/2021       Patient Care Team   Patient Care Team:  Clarene Critchley, MD as PCP - General  Darl Pikes Stevphen Rochester, MD as PCP - [facility] Empaneled Provider    History     Patient Active Problem List   Diagnosis Code    Depression F32.A    Headache(784.0) R51    Chest pain R07.9    Recurrent pulmonary embolism (HCC) I26.99    Dyslipidemia, goal LDL below 100 E78.5    Anxiety F41.9    Abnormal brain scan R94.02    Primary osteoarthritis of left knee M17.12    Primary osteoarthritis of right knee M17.11    RLS (restless legs syndrome) G25.81    Paresthesia of lower lip R20.2    Respiratory failure (HCC) J96.90    Atelectasis J98.11    Closed supracondylar fracture of humerus S42.413A    Back pain M54.9    History of spinal fusion Z98.1    Compression fracture of body of thoracic vertebra (HCC) S22.000A    Recurrent depression (HCC) F33.9     Past Medical History:   Diagnosis Date    Abnormal brain scan     Anxiety disorder     Arthritis     Chronic pain     Depression     DVT (deep venous thrombosis) (HCC)     Falls frequently     Fracture of left ankle     Headache(784.0) 2011    Ill-defined condition 09/15/2015    FX R HAND    Neurocutaneous syndrome (HCC)     Other and unspecified hyperlipidemia 12/27/2012    PE (pulmonary embolism) 12/2012    RLS (restless legs syndrome) 03/16/2016    Dr Scarlett Presto    TIA (transient ischemic attack) 03/2016    Right facial droop, numbness in lip    Torn rotator cuff     right      Past Surgical History:   Procedure Laterality Date    HX APPENDECTOMY      HX CESAREAN SECTION      x4    HX GI      COLONOSCOPY    HX KNEE REPLACEMENT Bilateral 10/29/2015     HX LUMBAR FUSION  06/02/2021    HX ORTHOPAEDIC  08/2004    rotater cuff r. shoulder    HX ORTHOPAEDIC      left ankle fracture treated with air cast    HX ORTHOPAEDIC  12/2011    left shoulder fxt and dislocation    HX OTHER SURGICAL  2010    left foot torn tendon    HX TUBAL LIGATION      IMPLANT BREAST SILICONE/EQ  2002    Bilateral breast implants    IR KYPHOPLASTY THORACIC  12/12/2019  PR UNLISTED PROCEDURE BREAST  2002    AUGMENTATION     Current Outpatient Medications   Medication Sig Dispense Refill    diazePAM (VALIUM) 5 mg tablet 1 in am and 2 in pm 90 Tablet 4    buPROPion XL (WELLBUTRIN XL) 150 mg tablet Take 1 Tablet by mouth every morning. 90 Tablet 1    escitalopram oxalate (LEXAPRO) 10 mg tablet Take 1 Tablet by mouth nightly. 90 Tablet 1    rOPINIRole (REQUIP) 1 mg tablet TAKE 1 TABLET AT 1-2PM AND TAKE 3 TABLETS BY MOUTH 1 TO 3 HOURS BEFORE BEDTIME EVERYDAY 360 Tablet 2    acetaminophen (TYLENOL) 325 mg tablet Take  by mouth every four (4) hours as needed for Pain.      atorvastatin (LIPITOR) 10 mg tablet TAKE 1 TABLET BY MOUTH EVERY DAY 90 Tablet 1    gabapentin enacarbil (Horizant) 600 mg TbER TAKE 1 TABLET BY MOUTH TWICE A DAY 180 Tablet 1    Vitamin D3 125 mcg (5,000 unit) tab tablet TAKE 1 TABLET BY MOUTH DAILY      omeprazole (PRILOSEC OTC) 20 mg tablet Take 20 mg by mouth every evening.      aspirin 81 mg chewable tablet Take 1 Tab by mouth daily. 30 Tab 0    naloxone (Narcan) 4 mg/actuation nasal spray Use 1 spray intranasally, then discard. Repeat with new spray every 2 min as needed for opioid overdose symptoms, alternating nostrils. (Patient not taking: No sig reported) 1 Each 0     No Known Allergies    Family History   Problem Relation Age of Onset    Heart Disease Mother     Heart Disease Father     Cancer Father         thyroid cancer    Arthritis-rheumatoid Sister     Other Brother         COVID    No Known Problems Daughter     No Known Problems Daughter     No Known Problems  Daughter     No Known Problems Son     Anesth Problems Neg Hx      Social History     Tobacco Use    Smoking status: Never    Smokeless tobacco: Never   Substance Use Topics    Alcohol use: No     Comment: Pt denies       Lindsay [name], was evaluated through a synchronous (real-time) audio-video encounter. The patient (or guardian if applicable) is aware that this is a billable service, which includes applicable co-pays. This Virtual Visit was conducted with patient's (and/or legal guardian's) consent. The visit was conducted pursuant to the emergency declaration under the [employer] and the [employer], 1135 waiver authority and the [employer] and [employer] Act.  Patient identification was verified, and a caregiver was present when appropriate.  The patient was located at: Home: [address]  [place] [address]  The provider was located at: Facility (Appt Department): [address] [place]  [place] [address]       Clarene Critchley, MD

## 2021-09-01 ENCOUNTER — Encounter: Admit: 2021-09-01 | Discharge: 2021-09-01 | Payer: MEDICARE | Attending: Specialist | Primary: Internal Medicine

## 2021-09-01 ENCOUNTER — Encounter: Admit: 2021-09-01 | Payer: MEDICARE | Primary: Internal Medicine

## 2021-09-01 ENCOUNTER — Ambulatory Visit: Attending: Specialist | Primary: Internal Medicine

## 2021-09-01 DIAGNOSIS — Z981 Arthrodesis status: Secondary | ICD-10-CM

## 2021-09-01 NOTE — Progress Notes (Signed)
Lindsay [name] (DOB: [birth year]) is a 66 y.o. female, patient, here for evaluation of the following chief complaint(s):  Surgical Follow-up (Sx 06/03/21 T10 - L2 Posterior Spinal Fusion With T11 - T12 Thoracic Laminectomy With Osteotomies T11, T12, L1 (PO#3))       ASSESSMENT/PLAN:  Below is the assessment and plan developed based on review of pertinent history, physical exam, labs, studies, and medications.  Patient presents today approximately 12 weeks status post recent thoracolumbar fusion.  Radiologic findings reviewed in detail with the patient.  She continues to progress very well overall.      She will start some outpatient physical therapy now.  She will wean out of the TLSO brace.  We will see her back in 3 months.      1. S/P lumbar fusion  -     XR SPINE LUMB 2 OR 3 V; Future  -     REFERRAL TO PHYSICAL THERAPY; Future      No follow-ups on file.      SUBJECTIVE/OBJECTIVE:  Lindsay [name] (DOB: 1955/12/14) is a 66 y.o. female.    Pain Assessment  07/21/2021   Location of Pain Back   Location Modifiers Posterior   Severity of Pain 4        Patient presents is 12 weeks status post recent thoracolumbar fusion.  She continues to progress very well overall.  She has been compliant with wearing her brace as well as her bending, lifting and twisting restrictions.  She has mild back pain only.  She has no radicular symptoms today.  She is off all medications for the surgery    Imaging:    XR Results (most recent):  Results from Appointment encounter on 09/01/21    XR SPINE LUMB 2 OR 3 V    Narrative  AP and lateral thoracolumbar spine demonstrates a stable T10-2 fusion.  Alignment maintained.  No other difficulties         No Known Allergies    Current Outpatient Medications   Medication Sig    diazePAM (VALIUM) 5 mg tablet 1 in am and 2 in pm    buPROPion XL (WELLBUTRIN XL) 150 mg tablet Take 1 Tablet by mouth every morning.    escitalopram oxalate (LEXAPRO) 10 mg tablet Take 1 Tablet by mouth nightly.    rOPINIRole  (REQUIP) 1 mg tablet TAKE 1 TABLET AT 1-2PM AND TAKE 3 TABLETS BY MOUTH 1 TO 3 HOURS BEFORE BEDTIME EVERYDAY    acetaminophen (TYLENOL) 325 mg tablet Take  by mouth every four (4) hours as needed for Pain.    atorvastatin (LIPITOR) 10 mg tablet TAKE 1 TABLET BY MOUTH EVERY DAY    naloxone (Narcan) 4 mg/actuation nasal spray Use 1 spray intranasally, then discard. Repeat with new spray every 2 min as needed for opioid overdose symptoms, alternating nostrils.    gabapentin enacarbil (Horizant) 600 mg TbER TAKE 1 TABLET BY MOUTH TWICE A DAY    Vitamin D3 125 mcg (5,000 unit) tab tablet TAKE 1 TABLET BY MOUTH DAILY    omeprazole (PRILOSEC OTC) 20 mg tablet Take 20 mg by mouth every evening.    aspirin 81 mg chewable tablet Take 1 Tab by mouth daily.     No current facility-administered medications for this visit.       Past Medical History:   Diagnosis Date    Abnormal brain scan     Anxiety disorder     Arthritis     Chronic pain  Depression     DVT (deep venous thrombosis) (HCC)     Falls frequently     Fracture of left ankle     Headache(784.0) 2011    Ill-defined condition 09/15/2015    FX R HAND    Neurocutaneous syndrome (HCC)     Other and unspecified hyperlipidemia 12/27/2012    PE (pulmonary embolism) 12/2012    RLS (restless legs syndrome) 03/16/2016    Dr Scarlett Presto    TIA (transient ischemic attack) 03/2016    Right facial droop, numbness in lip    Torn rotator cuff     right        Past Surgical History:   Procedure Laterality Date    HX APPENDECTOMY      HX CESAREAN SECTION      x4    HX GI      COLONOSCOPY    HX KNEE REPLACEMENT Bilateral 10/29/2015    HX LUMBAR FUSION  06/02/2021    HX ORTHOPAEDIC  08/2004    rotater cuff r. shoulder    HX ORTHOPAEDIC      left ankle fracture treated with air cast    HX ORTHOPAEDIC  12/2011    left shoulder fxt and dislocation    HX OTHER SURGICAL  2010    left foot torn tendon    HX TUBAL LIGATION      IMPLANT BREAST SILICONE/EQ  2002    Bilateral breast implants     IR KYPHOPLASTY THORACIC  12/12/2019    PR UNLISTED PROCEDURE BREAST  2002    AUGMENTATION       Family History   Problem Relation Age of Onset    Heart Disease Mother     Heart Disease Father     Cancer Father         thyroid cancer    Arthritis-rheumatoid Sister     Other Brother         COVID    No Known Problems Daughter     No Known Problems Daughter     No Known Problems Daughter     No Known Problems Son     Anesth Problems Neg Hx         Social History     Tobacco Use    Smoking status: Never    Smokeless tobacco: Never   Vaping Use    Vaping Use: Never used   Substance Use Topics    Alcohol use: No     Comment: Pt denies    Drug use: No        Review of Systems   Constitutional:  Negative for chills and fever.   Musculoskeletal:  Positive for back pain and gait problem.   Neurological:  Negative for weakness.   All other systems reviewed and are negative.       Vitals:  Ht 5\' 3"  (1.6 m)    Wt 145 lb (65.8 kg)    BMI 25.69 kg/m??    Body mass index is 25.69 kg/m??.    Physical Exam  Integumentary  Assessment of Surgical Incision - healing and consistent with normal anticipated wound healing.    Neurologic  Sensory  Light Touch - Intact - Globally.  Overall Assessment of Muscle Strength and Tone reveals  Lower Extremities - Right Iliopsoas - 5/5. Left Iliopsoas - 5/5. Right Tibialis Anterior - 5/5. Left Tibialis Anterior - 5/5. Right Gastroc-Soleus - 5/5. Left Gastroc-Soleus - 5/5. Right EHL - 5/5. Left EHL -  5/5.  General Assessment of Reflexes  Right Ankle - Clonus is not present. Left Ankle - Clonus is not present.  Reflexes (Dermatomes)  2/2 Normal - Left Achilles (L5-S2), Left Knee (L2-4), Right Achilles (L5-S2) and Right Knee (L2-4).    Musculoskeletal  Global Assessment  Examination of related systems reveals - well-developed, well-nourished, in no acute distress, alert and oriented x 3 and normal coordination.  Spine/Ribs/Pelvis  Lumbosacral Spine - Examination of the lumbosacral spine reveals - no known  fractures or deformities. Inspection and Palpation - Tenderness - moderate. Assessment of pain reveals the following findings - The pain is characterized as - moderate. Location - pain refers to lower back bilaterally.      An electronic signature was used to authenticate this note.  -- Kem Parkinson, MD

## 2021-09-01 NOTE — Progress Notes (Signed)
Done in office today.

## 2021-09-01 NOTE — Progress Notes (Signed)
 1. Have you been to the ER, urgent care clinic since your last visit?  Hospitalized since your last visit?No    2. Have you seen or consulted any other health care providers outside of the [facility] since your last visit?  Include any pap smears or colon screening. No    Chief Complaint   Patient presents with    Surgical Follow-up     Sx 06/03/21 T10 - L2 Posterior Spinal Fusion With T11 - T12 Thoracic Laminectomy With Osteotomies T11, T12, L1 (PO#3)

## 2021-09-01 NOTE — Addendum Note (Signed)
Addendum Note by Kem Parkinson, MD at 09/01/21 1410                Author: Kem Parkinson, MD  Service: --  Author Type: Physician       Filed: 09/08/21 1249  Encounter Date: 09/01/2021  Status: Signed          Editor: Kem Parkinson, MD (Physician)          Addended by: Kem Parkinson on: 09/08/2021 12:49 PM    Modules accepted: Orders

## 2021-09-08 ENCOUNTER — Encounter: Payer: MEDICARE | Primary: Internal Medicine

## 2021-09-22 ENCOUNTER — Encounter

## 2021-12-14 ENCOUNTER — Encounter

## 2021-12-14 MED ORDER — HORIZANT 600 MG PO TBCR
600 MG | ORAL_TABLET | Freq: Two times a day (BID) | ORAL | 1 refills | Status: AC
Start: 2021-12-14 — End: 2022-02-16

## 2021-12-14 NOTE — Telephone Encounter (Signed)
Received a mychart message stating, "It is my understanding that Dr. Alison Murray left your practice and SHE DID NOT NOTIFY ME OF THIS.  I talked with someone at your practice, who informed me she had left, was surprised she didn't notify me. I had someone help me during the change period last fall and he messed everything up for me.  I now have [facility] for scripts and they do not cover Horizant.  I  have been off it since the first of April.  I had some old gabapentin so I took that in place of [place] as I have been unable to get ANYONE at your office to answer the phone since the 10th of March!!  I have done the best I could, but [facility] wants me to to on a substitute for Horizant.  I have an appointment set up with another doctor THERE in July and was hoping to hold out until that appointment. However, in the last three weeks, I am starting to go crazy with my legs wanting to move since about 9:00 a.m. and it continues until I go to bed.  I cannot take this much more.  I'll pay out of pocket which is $1106.99 for 30 days of Horizant, I just need a prescrption.  I am and have been wondering if there is anyone who works there anymore as there is NO way to get in touch with anyone there!! Did the office CLOSE??? If I do not hear from you today, I will come to your office this after-noon to see if people are still working there. PLEASE contact me!!!!! Thank you."     Offered earlier appointment with NP [facility] but patient did not want it. Stated insurance does not cover horizant but states she is willing to pay out of pocket.     LOV: 07/11/20 with Dr. Alison Murray  Next visit: 01/26/22 with Dr. Lauree Chandler    She was originally taking gabapentin enacarbil (Horizant) 600 mg TbER-TAKE 1 TABLET BY MOUTH TWICE A DAY for RLS.

## 2021-12-15 NOTE — Telephone Encounter (Signed)
Received message that transmission script for horizant failed to pharmacy. I have called pharmacy and confirmed that they did not receive the e-script. I have verbally given the order for horizant.

## 2022-01-08 ENCOUNTER — Encounter: Payer: PRIVATE HEALTH INSURANCE | Attending: Specialist | Primary: Internal Medicine

## 2022-01-08 ENCOUNTER — Ambulatory Visit: Admit: 2022-01-08 | Discharge: 2022-01-08 | Payer: MEDICARE | Attending: Specialist | Primary: Internal Medicine

## 2022-01-08 ENCOUNTER — Ambulatory Visit: Admit: 2022-01-08 | Discharge: 2022-01-12 | Payer: MEDICARE | Primary: Internal Medicine

## 2022-01-08 DIAGNOSIS — Z981 Arthrodesis status: Secondary | ICD-10-CM

## 2022-01-09 NOTE — Progress Notes (Signed)
[name] (DOB: 08-18-1955) is a 66 y.o. female, patient, here for evaluation of the following chief complaint(s):  Lower Back Pain (06/03/2022 T10 - L2 POSTERIOR SPINAL FUSION WITH T11 - T12 THORACIC LAMINECTOMY (MAZOR/O-ARM) WITH OSTEOTOMIES T11, T12, L1/ )       ASSESSMENT/PLAN:    Below is the assessment and plan developed based on review of pertinent history, physical exam, labs, studies, and medications.    At this point she has had a brief episode of right-sided hip and posterior buttock and posterior sciatic type pain.  It has improved in the last couple of days.  She has no motor or sensory deficits.  The pain has moderated.  There is tenderness mainly along the right posterior buttock and hip region.  She will increase activities and do home exercise.  She did not require any medications today.  She will contact us if symptoms recur or persist.    1. S/P spinal fusion  -     XR LUMBAR SPINE (MIN 4 VIEWS); Future  2. Acute right-sided low back pain without sciatica      No follow-ups on file.      SUBJECTIVE/OBJECTIVE:  EVERLEE QUAKENBUSH (DOB: Jul 28, 1955) is a 66 y.o. female.    No flowsheet data found.     She comes in today for follow-up.  She is status post T10-L2 fusion for a kyphoscoliosis.  She had been doing well until approximately 3 to 4 days ago when she developed an acute episode of right posterior buttock and posterior hip Pain.  She denies any specific trauma.  She denies any bowel bladder difficulties.  The pain did improve with conservative measures but she wanted to be checked just to make sure nothing happened to her hardware.  She denies any new radicular symptoms now.    Imaging:    Xray Result (most recent):  XR LUMBAR SPINE (MIN 4 VIEWS) 01/08/2022    Narrative  AP and lateral and flexion-extension of the lumbar spine reviewed today.  Stable T10-L2 posterior lumbar fusion.  No acute hardware difficulties.  No acute changes from her images 4 months ago.  No fractures or lytic  lesions.                MRI Results (most recent):    None      No Known Allergies    Current Outpatient Medications   Medication Sig Dispense Refill    Gabapentin Enacarbil ER (HORIZANT) 600 MG TBCR Take 1 tablet by mouth 2 times daily for 30 days. Max Daily Amount: 2 tablets 60 tablet 1    acetaminophen (TYLENOL) 325 MG tablet Take by mouth every 4 hours as needed      aspirin 81 MG chewable tablet Take by mouth daily      atorvastatin (LIPITOR) 10 MG tablet TAKE 1 TABLET BY MOUTH EVERY DAY      buPROPion (WELLBUTRIN XL) 150 MG extended release tablet Take by mouth      vitamin D3 (CHOLECALCIFEROL) 125 MCG (5000 UT) TABS tablet TAKE 1 TABLET BY MOUTH DAILY      escitalopram (LEXAPRO) 10 MG tablet Take by mouth      naloxone 4 MG/0.1ML LIQD nasal spray Use 1 spray intranasally, then discard. Repeat with new spray every 2 min as needed for opioid overdose symptoms, alternating nostrils.      omeprazole (PRILOSEC OTC) 20 MG tablet Take by mouth every evening      rOPINIRole (REQUIP) 1 MG tablet  TAKE 1 TABLET AT 1-2PM AND TAKE 3 TABLETS BY MOUTH 1 TO 3 HOURS BEFORE BEDTIME EVERYDAY       No current facility-administered medications for this visit.       Past Medical History:   Diagnosis Date    Abnormal brain scan     Anxiety disorder     Arthritis     Chronic pain     Depression     DVT (deep venous thrombosis) (HCC)     Falls frequently     Fracture of left ankle     Headache(784.0) 2011    Ill-defined condition 09/15/2015    FX R HAND    Neurocutaneous syndrome (HCC)     Other and unspecified hyperlipidemia 12/27/2012    PE (pulmonary embolism) 12/2012    RLS (restless legs syndrome) 03/16/2016    Dr Scarlett Presto    TIA (transient ischemic attack) 03/2016    Right facial droop, numbness in lip    Torn rotator cuff     right        Past Surgical History:   Procedure Laterality Date    APPENDECTOMY      BREAST SURGERY  2002    AUGMENTATION    CESAREAN SECTION      x4    GI      COLONOSCOPY    IMPLANT BREAST SILICONE/EQ   2002    Bilateral breast implants    IR KYPHOPLASTY THORACIC FIRST LEVEL  12/12/2019    IR KYPHOPLASTY THORACIC FIRST LEVEL 12/12/2019 [facility] IR    IR KYPHOPLASTY THORACIC FIRST LEVEL  12/12/2019    LUMBAR FUSION  06/02/2021    ORTHOPEDIC SURGERY  08/2004    rotater cuff r. shoulder    ORTHOPEDIC SURGERY      left ankle fracture treated with air cast    ORTHOPEDIC SURGERY  12/2011    left shoulder fxt and dislocation    OTHER SURGICAL HISTORY  2010    left foot torn tendon    TOTAL KNEE ARTHROPLASTY Bilateral 10/29/2015    TUBAL LIGATION         Family History   Problem Relation Age of Onset    Anesth Problems Neg Hx     No Known Problems Son     No Known Problems Daughter     No Known Problems Daughter     Heart Disease Mother     Other Brother         COVID    Rheum Arthritis Sister     Cancer Father         thyroid cancer    Heart Disease Father     No Known Problems Daughter         Social History     Tobacco Use    Smoking status: Never    Smokeless tobacco: Never   Substance Use Topics    Alcohol use: No    Drug use: No        Review of Systems      Vitals:  Ht 5\' 3"  (1.6 m)   Wt 150 lb (68 kg)   BMI 26.57 kg/m    Body mass index is 26.57 kg/m.    Ortho Exam     Integumentary  Assessment of Surgical Incision - healing and consistent with normal anticipated wound healing.    Neurologic  Sensory  Light Touch - Intact - Globally.  Overall Assessment of Muscle Strength and Tone reveals  Lower Extremities - Right Iliopsoas - 5/5. Left Iliopsoas - 5/5. Right Tibialis Anterior - 5/5. Left Tibialis Anterior - 5/5. Right Gastroc-Soleus - 5/5. Left Gastroc-Soleus - 5/5. Right EHL - 5/5. Left EHL - 5/5.  General Assessment of Reflexes  Right Ankle - Clonus is not present. Left Ankle - Clonus is not present.  Reflexes (Dermatomes)  2/2 Normal - Left Achilles (L5-S2), Left Knee (L2-4), Right Achilles (L5-S2) and Right Knee (L2-4).    Musculoskeletal  Global Assessment  Examination of related systems reveals -  well-developed, well-nourished, in no acute distress, alert and oriented x 3 and normal coordination.  Spine/Ribs/Pelvis  Lumbosacral Spine - Examination of the lumbosacral spine reveals - no known fractures or deformities. Inspection and Palpation - Tenderness - moderate. Assessment of pain reveals the following findings - The pain is characterized as - moderate. Location - pain refers to lower back bilaterally.       An electronic signature was used to authenticate this note.  -- Kem Parkinson, MD

## 2022-01-09 NOTE — Patient Instructions (Signed)
Patient Education        Low Back Pain: Exercises  Introduction  Here are some examples of exercises for you to try. The exercises may be suggested for a condition or for rehabilitation. Start each exercise slowly. Ease off the exercises if you start to have pain.  You will be told when to start these exercises and which ones will work best for you.  How to do the exercises  Press-up    Lie on your stomach, supporting your body with your forearms.  Press your elbows down into the floor to raise your upper back. As you do this, relax your stomach muscles and allow your back to arch without using your back muscles. As your press up, do not let your hips or pelvis come off the floor.  Hold for 15 to 30 seconds, then relax.  Repeat 2 to 4 times.  Alternate arm and leg (bird dog)    Start on the floor, on your hands and knees.  Tighten your belly muscles by pulling your belly button in toward your spine. Be sure you continue to breathe normally and do not hold your breath.  Keeping your back and neck straight, raise one arm off the floor and hold it straight out in front of you. Be careful not to let your shoulder drop down, because that will twist your trunk.  Hold for about 6 seconds, then lower your arm and switch to your other arm. Over time, work up to holding for 10 to 30 seconds each time.  Repeat 8 to 12 times with each arm.  When you feel steady and strong doing this exercise with your arms, try doing the exercise with your legs instead. Raise one leg and hold it straight out behind you. Be careful not to let your hip drop down, because that will twist your trunk.  When holding your leg straight out becomes easier, try raising your opposite arm at the same time.  Knee-to-chest exercise    Lie on your back with your knees bent and your feet flat on the floor.  Bring one knee to your chest, keeping the other foot flat on the floor (or keeping the other leg straight, whichever feels better on your lower  back).  Keep your lower back pressed to the floor. Hold for at least 15 to 30 seconds.  Relax, and lower the knee to the starting position.  Repeat with the other leg. Repeat 2 to 4 times with each leg.  To get more stretch, put your other leg flat on the floor while pulling your knee to your chest.  Curl-ups    Lie on the floor on your back with your knees bent at a 90-degree angle. Your feet should be flat on the floor, about 12 inches from your buttocks.  Cross your arms over your chest. If this bothers your neck, try putting your hands behind your neck (not your head), with your elbows spread apart.  Slowly tighten your belly muscles and raise your shoulder blades off the floor.  Keep your head in line with your body, and do not press your chin to your chest.  Hold this position for 1 or 2 seconds, then slowly lower yourself back down to the floor.  Repeat 8 to 12 times.  Pelvic tilt    Lie on your back with your knees bent and your feet flat on the floor.  Tighten your belly muscles and buttocks, and press your lower back to the   floor. You should feel your hips and pelvis rock back.  Hold for about 6 seconds while breathing smoothly, and then relax.  Repeat 8 to 12 times.  Heel dig bridging    Lie on your back with both knees bent and your ankles bent so that only your heels are digging into the floor. Your knees should be bent about 90 degrees.  Then push your heels into the floor, squeeze your buttocks, and lift your hips off the floor until your shoulders, hips, and knees are all in a straight line.  Hold for about 6 seconds as you continue to breathe normally, and then slowly lower your hips back down to the floor and rest for up to 10 seconds.  Do 8 to 12 repetitions.  Hamstring stretch in doorway    Lie on your back in a doorway, with one leg through the open door.  Slide your leg up the wall to straighten your knee. You should feel a gentle stretch down the back of your leg.  Hold the stretch for at  least 15 to 30 seconds. Do not arch your back, point your toes, or bend either knee. Keep one heel touching the floor and the other heel touching the wall.  Repeat with your other leg.  Do 2 to 4 times for each leg.  Hip flexor stretch    Kneel on the floor with one knee bent and one leg behind you. Place your forward knee over your foot. Keep your other knee touching the floor.  Slowly push your hips forward until you feel a stretch in the upper thigh of your rear leg.  Hold the stretch for at least 15 to 30 seconds. Repeat with your other leg.  Do 2 to 4 times on each side.  Back press    Stand with your back 10 to 12 inches away from a wall.  Lean into the wall until your back is against it. Press your lower back against the wall by pulling in your stomach muscles.  Slowly slide down until your knees are slightly bent, pressing your lower back into the wall.  Hold for at least 6 seconds, then slide back up the wall.  Repeat 8 to 12 times.  Over time, work up to holding this position for as much as 1 minute.  Follow-up care is a key part of your treatment and safety. Be sure to make and go to all appointments, and call your doctor if you are having problems. It's also a good idea to know your test results and keep a list of the medicines you take.  Current as of: May 13, 2021               Content Version: 13.7   2006-2023 [employer].   Care instructions adapted under license by [facility]. If you have questions about a medical condition or this instruction, always ask your healthcare professional. [employer] disclaims any warranty or liability for your use of this information.

## 2022-01-11 ENCOUNTER — Telehealth

## 2022-01-11 MED ORDER — TRAMADOL HCL 50 MG PO TABS
50 MG | ORAL_TABLET | Freq: Four times a day (QID) | ORAL | 0 refills | Status: AC | PRN
Start: 2022-01-11 — End: 2022-01-18

## 2022-01-11 NOTE — Telephone Encounter (Signed)
Lindsay [name]  Seen Friday. Calls today for pain medication. Would you like to prescribe her something?        Below is the assessment and plan developed based on review of pertinent history, physical exam, labs, studies, and medications.     At this point she has had a brief episode of right-sided hip and posterior buttock and posterior sciatic type pain.  It has improved in the last couple of days.  She has no motor or sensory deficits.  The pain has moderated.  There is tenderness mainly along the right posterior buttock and hip region.  She will increase activities and do home exercise.  She did not require any medications today.  She will contact us if symptoms recur or persist.

## 2022-01-12 ENCOUNTER — Encounter

## 2022-01-13 MED ORDER — TRAMADOL HCL 50 MG PO TABS
50 MG | ORAL_TABLET | Freq: Four times a day (QID) | ORAL | 0 refills | Status: AC | PRN
Start: 2022-01-13 — End: 2022-01-27

## 2022-01-14 ENCOUNTER — Encounter

## 2022-01-15 MED ORDER — DIAZEPAM 5 MG PO TABS
5 MG | ORAL_TABLET | ORAL | 0 refills | Status: AC
Start: 2022-01-15 — End: 2022-02-15

## 2022-01-15 NOTE — Telephone Encounter (Signed)
From: [name]  To: Dr. Clarene Critchley  Sent: 01/14/2022 4:58 PM EDT  Subject: Diazepam     I've been taking this for years. It was just refilled on June 12th and I need another refill. I'm not sure why it's not showing up on my medication list. I'm going to run out tomorrow. Could someone call this in at the [facility] in [place]?

## 2022-01-24 ENCOUNTER — Encounter

## 2022-01-24 MED ORDER — ESCITALOPRAM OXALATE 10 MG PO TABS
10 MG | ORAL_TABLET | ORAL | 1 refills | Status: DC
Start: 2022-01-24 — End: 2022-01-26

## 2022-01-26 ENCOUNTER — Encounter: Attending: Neurology | Primary: Internal Medicine

## 2022-01-26 ENCOUNTER — Ambulatory Visit: Admit: 2022-01-26 | Discharge: 2022-02-01 | Payer: MEDICARE | Attending: Neurology | Primary: Internal Medicine

## 2022-01-26 DIAGNOSIS — G2581 Restless legs syndrome: Secondary | ICD-10-CM

## 2022-01-26 MED ORDER — PREGABALIN 75 MG PO CAPS
75 MG | ORAL_CAPSULE | Freq: Two times a day (BID) | ORAL | 0 refills | Status: DC
Start: 2022-01-26 — End: 2022-02-16

## 2022-01-26 NOTE — Progress Notes (Signed)
Chief Complaint   Patient presents with    Follow-up     RLS     Vitals:    01/26/22 1034   BP: 128/84   Pulse: 88   SpO2: 96%

## 2022-01-26 NOTE — Progress Notes (Signed)
Neurology Progress Note    HISTORY PROVIDED BY: patient    Chief Complaint   Patient presents with    Follow-up     RLS        Subjective:   Pt is a 66y.o. right handed female last seen by Dr. Feliberto Gottron January 2022  She has h/o radiographic changes c/w MS, but no clinical symptoms, stable MRI's since 2011, the last MRI brain 02/17/17.   Additionally, has RLS, with an ill-defined irritating sensation in her legs with urge to move her legs beginning around 6PM and keeping her awake and inhibiting her activities, though question if there may be some component of psych issues/akasthesia contributing to her need to walk.   RLS well controlled on Horizant 600mg  bid and Requip 4mg  daily.  She ran out of Horizant and was tried on gabapentin which did nothing.  She is now having to pay a very high co-pay as it was not being covered due to a mixup in her insurance policy.  She is hoping to get it reinstated the beginning of next year.    She has had fusion surgery from T11-L2 due to fractured vertebra related to a fall.   She is taking Cymbalta, Wellbutrin, and Valium 5 mg in AM and 10mg  in PM.     Previous testing:  In past, c/o memory loss, suspected due to underlying mood disorder/personality d/o, and stress, and this suspicion was confirmed on neuropsychological testing with Dr. in April, 2019.    Current Outpatient Medications   Medication Sig    DULoxetine (CYMBALTA) 60 MG extended release capsule Take by mouth daily    ergocalciferol (ERGOCALCIFEROL) 1.25 MG (50000 UT) capsule     traMADol (ULTRAM) 50 MG tablet 1-2 po qhs prn night pain    pregabalin (LYRICA) 75 MG capsule Take 1 capsule by mouth 2 times daily for 30 days. Max Daily Amount: 150 mg    diazePAM (VALIUM) 5 MG tablet Take 1 tab in am and 2 tabs in pm    acetaminophen (TYLENOL) 325 MG tablet Take by mouth every 4 hours as needed    aspirin 81 MG chewable tablet Take by mouth daily    atorvastatin (LIPITOR) 10 MG tablet TAKE 1 TABLET BY MOUTH EVERY  DAY    buPROPion (WELLBUTRIN XL) 150 MG extended release tablet Take by mouth    vitamin D3 (CHOLECALCIFEROL) 125 MCG (5000 UT) TABS tablet TAKE 1 TABLET BY MOUTH DAILY    omeprazole (PRILOSEC OTC) 20 MG tablet Take by mouth every evening    rOPINIRole (REQUIP) 1 MG tablet TAKE 1 TABLET AT 1-2PM AND TAKE 3 TABLETS BY MOUTH 1 TO 3 HOURS BEFORE BEDTIME EVERYDAY    calcitonin (MIACALCIN) 200 UNIT/ACT nasal spray INSTILL 1 SPRAY INTO ONE NOSTRIL EVERY DAY FOR 1 WEEK (Patient not taking: Reported on 01/26/2022)    DIAZEPAM PO  (Patient not taking: Reported on 01/26/2022)    HYDROcodone-acetaminophen (NORCO) 5-325 MG per tablet Take 1 tablet every 4-6 hours by oral route as needed for 7 days. (Patient not taking: Reported on 01/26/2022)    traMADol (ULTRAM) 50 MG tablet Take 1 tablet by mouth every 6 hours as needed for Pain for up to 14 days. Supervising Physician: 01/28/2022 MD NPI: 01/28/2022 DEA: 01/28/2022 Max Daily Amount: 200 mg (Patient not taking: Reported on 01/26/2022)    Gabapentin Enacarbil ER (HORIZANT) 600 MG TBCR Take 1 tablet by mouth 2 times daily for 30 days. Max Daily Amount: 2  tablets    naloxone 4 MG/0.1ML LIQD nasal spray Use 1 spray intranasally, then discard. Repeat with new spray every 2 min as needed for opioid overdose symptoms, alternating nostrils. (Patient not taking: Reported on 01/26/2022)     No current facility-administered medications for this visit.     MRI Result (most recent):  MRI LUMBAR SPINE WO CONTRAST 12/11/2019    Narrative  EXAM: MRI THORAC SPINE WO CONT, MRI LUMB SPINE WO CONT    INDICATION: t12 compression fx on 6/3, r/o cord compression.    COMPARISON: Lumbar spine CT dated 12/06/2019    TECHNIQUE: MR imaging of the thoracic and lumbar spine was performed using the  following sequences: sagittal T1, T2, stir; axial T1, T2. Additional coronal T2  images were obtained through the lumbar spine    CONTRAST: None.    FINDINGS:    Comparison prior study, there is increased loss of  height of the T12 vertebral  body with increased bony retropulsion. Superior endplate compression fracture of  L4 is also noted which is likely chronic. There is moderate severe canal  stenosis at T12 with moderate bilateral foraminal narrowing. Possible mild  compression on the spinal cord. No definite abnormal signal is identified in the  conus.    The remaining thoracic spine levels are unremarkable.    T12-L1 demonstrates minimal disc bulge without canal stenosis or foraminal  narrowing.    L1-2 demonstrates no evidence of canal stenosis or foraminal narrowing.    At L2-3, there is mild facet hypertrophy without canal stenosis or foraminal  narrowing.    At L3-4, there is a mild concentric disc bulge and facet hypertrophy with  minimal canal stenosis and minimal bilateral foraminal narrowing.    L4-5 there is severe facet hypertrophy and ligament flavum hypertrophy as well  with a mild disc bulge causing mild canal stenosis. No significant foraminal  narrowing.    L5-S1, there is concentric bulge with osteophyte formation and a probable small  central disc extrusion with cranial migration. Overall canal stenosis is  minimal. The neural foramen are patent.    Incidental soft tissue imaging is unremarkable    Impression  1.  Increase loss of height at T12 with increased bony retropulsion and moderate  severe canal stenosis. Possible compression on the cord although no abnormal  cord signal is identified.    2.  Other degenerative changes in the lumbar spine as described.    3.  Mild superior endplate compression deformity of L4 with a chronic    MRI brain      EXAM:  MRI BRAIN W WO CONT     INDICATION:    Pt with MRI findings concerning for MS, but no clinical history  to suggest MS, now with new bilateral rotary nystagmus     COMPARISON:  MRI brain 03/27/2016.     CONTRAST: 15 ml Dotarem.     TECHNIQUE:    Multiplanar multisequence acquisition without and with contrast of the brain.     FINDINGS:  Generalized  parenchymal volume loss with commensurate dilation of the sulci and  ventricular system. Unchanged periventricular and deep white matter T2/FLAIR  hyperintensities, in a pattern most consistent with chronic microvascular  ischemic disease. No involvement of the callosal septal interface. Small chronic  infarct in the right cerebellum. There is no acute infarct, hemorrhage,  extra-axial fluid collection, or mass effect. There is no cerebellar tonsillar  herniation. Expected arterial flow-voids are present. No evidence of abnormal  enhancement.  The paranasal sinuses, mastoid air cells, and middle ears are clear. The orbital  contents are within normal limits. No significant osseous or scalp lesions are  identified.     IMPRESSION:  IMPRESSION:   1. No evidence of acute intracranial abnormality.  2. Unchanged generalized parenchymal volume loss and chronic microvascular  ischemic disease. No evidence of demyelinating disease as clinically queried.                  Lab Results   Component Value Date    WBC 10.0 06/06/2021    HGB 8.8 (L) 06/06/2021    HCT 28.7 (L) 06/06/2021    MCV 87.0 06/06/2021    PLT 212 06/06/2021     Lab Results   Component Value Date    NA 138 06/06/2021    K 3.4 (L) 06/06/2021    CL 104 06/06/2021    CO2 29 06/06/2021    BUN 10 06/06/2021    CREATININE 0.54 (L) 06/06/2021    GLUCOSE 117 (H) 06/06/2021    CALCIUM 8.4 (L) 06/06/2021    PROT 6.2 (L) 06/06/2021    LABALBU 2.5 (L) 06/06/2021    BILITOT 0.4 06/06/2021    ALKPHOS 98 06/06/2021    AST 21 06/06/2021    ALT 15 06/06/2021    GFRAA >60 12/12/2019    AGRATIO 0.7 (L) 06/06/2021    GLOB 3.7 06/06/2021         Imaging:Exam:  Vitals:    01/26/22 1034   BP: 128/84   Pulse: 88   SpO2: 96%     General:  Alert, cooperative, no distress.    Head:  Normocephalic, without obvious abnormality, atraumatic.   Respiratory:  Heart:   Non labored breathing  RRR no murmurs   Carotids: 2+ carotids, no bruits   Extremities: Warm, no edema   Pulses: 2+radial  pulses       Neurologic:  MS: Alert and oriented x 4, speech intact. Language - inact. Attention and fund of knowledge appropriate.  Recent and remote memory intact.  Cranial Nerves:  II: visual fields VFF   II: pupils    II: optic disc    III,VII: ptosis Mild right ptosis - stable   III,IV,VI: extraocular muscles  EOMI, no nystagmus seen, no diplopia   V: facial light touch sensation     VII: facial muscle function   symmetric   VIII: hearing intact   IX: soft palate elevation     XI: trapezius strength     XI: sternocleidomastoid strength    XII: tongue       Motor: normal bulk and tone, no tremor, no PD, 5/5 throughout  Sensory: (At previous visits: Intact to LT/PP throughout)  Coordination: FTN and RAM intact  Gait: Cautious gait, normal stride, turns normally  Reflexes: 2+ sym       Assessment/Plan   Pt is a 66y.o. right handed female with radiographic changes c/w MS, but no clinical symptoms, stable MRI's since 2011, the last MRI brain 02/17/17.     Additionally, has RLS, with an ill-defined irritating sensation in her legs with urge to move her legs beginning around 6PM and keeping her awake and inhibiting her activities, though question if there may be some component of psych issues/akasthesia contributing to her need to walk. RLS well controlled on Horizant 600mg  bid and Requip 4mg  daily.   She recently had to switch to generic gabapentin which did not help her at all and is currently paying  a very high co-pay for Horizant  I have recommended trying pregabalin 75 mg twice daily and see how she does  She does expect that her new supplemental insurance policy will kick in next year and she may be able to get the Horizant covered    Total time 30 minutes     Diagnosis Orders   1. Restless legs syndrome (RLS)  pregabalin (LYRICA) 75 MG capsule      2. Abnormal brain MRI        3. Status post lumbar spinal fusion          Wells Guiles, MD

## 2022-02-10 ENCOUNTER — Encounter

## 2022-02-10 MED ORDER — ATORVASTATIN CALCIUM 10 MG PO TABS
10 MG | ORAL_TABLET | ORAL | 0 refills | Status: AC
Start: 2022-02-10 — End: 2022-03-05

## 2022-02-10 MED ORDER — BUPROPION HCL ER (XL) 150 MG PO TB24
150 MG | ORAL_TABLET | ORAL | 0 refills | Status: DC
Start: 2022-02-10 — End: 2022-03-09

## 2022-02-16 ENCOUNTER — Ambulatory Visit: Admit: 2022-02-16 | Payer: MEDICARE | Attending: Internal Medicine | Primary: Internal Medicine

## 2022-02-16 ENCOUNTER — Encounter

## 2022-02-16 ENCOUNTER — Encounter: Payer: MEDICARE | Attending: Internal Medicine | Primary: Internal Medicine

## 2022-02-16 DIAGNOSIS — F332 Major depressive disorder, recurrent severe without psychotic features: Secondary | ICD-10-CM

## 2022-02-16 MED ORDER — ESCITALOPRAM OXALATE 5 MG PO TABS
5 MG | ORAL_TABLET | Freq: Every day | ORAL | 1 refills | Status: DC
Start: 2022-02-16 — End: 2022-04-13

## 2022-02-16 NOTE — Progress Notes (Signed)
Lindsay [name] (DOB:  [birth year]) is a 66 y.o. female,Established patient, here for evaluation of the following chief complaint(s):  Follow-up, Anxiety, Depression, Medication Refill, and Discuss Medications (Horizant)         ASSESSMENT/PLAN:  1. Severe episode of recurrent major depressive disorder, without psychotic features ([facility])-she stopped her ssri some months ago and I am worried about t his and escalating anger and rage-resume low dose and strongly advised make an appt with psychiatry and a therapist-names provided  See me in 1 mo  Cont t he wellbutrin xl 150 mg   -     escitalopram (LEXAPRO) 5 MG tablet; Take 1 tablet by mouth daily, Disp-30 tablet, R-1Normal  2. RLS (restless legs syndrome)-currently managed on requip and horizant  -     CBC; Future  3. Other specified anxiety disorders  4. Dyslipidemia, goal LDL below 70-she is unclear if she is using the lipitor or not-advised bring all meds to next visit  -     Lipid Panel; Future  -     Comprehensive Metabolic Panel; Future  -     TSH; Future      No follow-ups on file.         Subjective   SUBJECTIVE/OBJECTIVE:  HPI  Seen to discuss her medications.  Her husband reached out to me indicating that she has been much more irritable and it is difficult living with her.  Lindsay [name] is tearful today and notes that in her words she is horrible to her family she feels rage and anger often sadness over the loss of her father and brother within the last year and sadness that her sister has cut off their relationship.  Notes that some months ago she stopped her Lexapro fearful that it was contributing to her restless legs.  She has had trouble with her medications for restless legs partly because of the cost and coverage issues.  Currently is using Requip 1 mg tablets 4 pills/day Valium 5 mg 3 pills/day and horizontal 600 mg twice daily which is very pricey.    Has not seen a psychiatrist recently ever since her previous doctor retired.  Has not gotten with a  therapist and realizes be been beneficial.    Denies thoughts of self-harm or dark thoughts.  Denies excess alcohol use.  Conflict with her daughter who is now working in the family business.  Son will be getting married this fall this is adding to the stress.  Denies hallucinations or confusion  Review of Systems       Objective   Physical Exam             An electronic signature was used to authenticate this note.    --Lindsay Acre, MD

## 2022-02-17 LAB — TSH: TSH, 3RD GENERATION: 2.33 u[IU]/mL (ref 0.36–3.74)

## 2022-02-17 LAB — LIPID PANEL
Chol/HDL Ratio: 4.1 (ref 0.0–5.0)
Cholesterol, Total: 238 MG/DL — ABNORMAL HIGH (ref <200)
HDL: 58 MG/DL
LDL Calculated: 125.2 MG/DL — ABNORMAL HIGH (ref 0–100)
Triglycerides: 274 MG/DL — ABNORMAL HIGH (ref <150)
VLDL Cholesterol Calculated: 54.8 MG/DL

## 2022-02-17 LAB — CBC
Hematocrit: 33.2 % — ABNORMAL LOW (ref 35.0–47.0)
Hemoglobin: 9.8 g/dL — ABNORMAL LOW (ref 11.5–16.0)
MCH: 23.6 PG — ABNORMAL LOW (ref 26.0–34.0)
MCHC: 29.5 g/dL — ABNORMAL LOW (ref 30.0–36.5)
MCV: 80 FL (ref 80.0–99.0)
MPV: 10.1 FL (ref 8.9–12.9)
Nucleated RBCs: 0 PER 100 WBC
Platelets: 309 10*3/uL (ref 150–400)
RBC: 4.15 M/uL (ref 3.80–5.20)
RDW: 15.4 % — ABNORMAL HIGH (ref 11.5–14.5)
WBC: 7.2 10*3/uL (ref 3.6–11.0)
nRBC: 0 10*3/uL (ref 0.00–0.01)

## 2022-02-17 LAB — COMPREHENSIVE METABOLIC PANEL
ALT: 16 U/L (ref 12–78)
AST: 14 U/L — ABNORMAL LOW (ref 15–37)
Albumin/Globulin Ratio: 1.1 (ref 1.1–2.2)
Albumin: 3.9 g/dL (ref 3.5–5.0)
Alk Phosphatase: 144 U/L — ABNORMAL HIGH (ref 45–117)
Anion Gap: 7 mmol/L (ref 5–15)
BUN: 18 MG/DL (ref 6–20)
Bun/Cre Ratio: 25 — ABNORMAL HIGH (ref 12–20)
CO2: 26 mmol/L (ref 21–32)
Calcium: 9.3 MG/DL (ref 8.5–10.1)
Chloride: 107 mmol/L (ref 97–108)
Creatinine: 0.73 MG/DL (ref 0.55–1.02)
Est, Glom Filt Rate: >60 mL/min/{1.73_m2} (ref >60)
Globulin: 3.4 g/dL (ref 2.0–4.0)
Glucose: 87 mg/dL (ref 65–100)
Potassium: 4.2 mmol/L (ref 3.5–5.1)
Sodium: 140 mmol/L (ref 136–145)
Total Bilirubin: 0.2 MG/DL (ref 0.2–1.0)
Total Protein: 7.3 g/dL (ref 6.4–8.2)

## 2022-02-18 ENCOUNTER — Encounter

## 2022-02-18 MED ORDER — DIAZEPAM 5 MG PO TABS
5 MG | ORAL_TABLET | ORAL | 0 refills | Status: DC
Start: 2022-02-18 — End: 2022-03-22

## 2022-02-18 MED ORDER — HORIZANT 600 MG PO TBCR
600 MG | ORAL_TABLET | Freq: Two times a day (BID) | ORAL | 1 refills | Status: AC
Start: 2022-02-18 — End: 2022-03-20

## 2022-02-18 NOTE — Telephone Encounter (Signed)
Patient requesting refills for medications:   DIAZEPAM 5 MG TABLET    Patient is out of medication, would need this called in today for refill to take tonight     Pharmacy:   [facility] [address] - [place], [place] -   [address]   P [phone]   F [phone]

## 2022-02-25 ENCOUNTER — Encounter

## 2022-02-25 MED ORDER — PREGABALIN 100 MG PO CAPS
100 MG | ORAL_CAPSULE | Freq: Two times a day (BID) | ORAL | 1 refills | Status: AC
Start: 2022-02-25 — End: 2022-08-24

## 2022-03-05 ENCOUNTER — Encounter

## 2022-03-05 MED ORDER — ATORVASTATIN CALCIUM 10 MG PO TABS
10 MG | ORAL_TABLET | ORAL | 0 refills | Status: DC
Start: 2022-03-05 — End: 2022-04-02

## 2022-03-09 ENCOUNTER — Encounter

## 2022-03-09 MED ORDER — BUPROPION HCL ER (XL) 150 MG PO TB24
150 MG | ORAL_TABLET | ORAL | 0 refills | Status: AC
Start: 2022-03-09 — End: 2022-04-06

## 2022-03-12 MED ORDER — ROPINIROLE HCL 1 MG PO TABS
1 MG | ORAL_TABLET | ORAL | 2 refills | Status: AC
Start: 2022-03-12 — End: 2022-11-30

## 2022-03-12 NOTE — Telephone Encounter (Signed)
LOV: 01/26/22  Last refill: 06/18/21 with 2 refills as a 90 day supply  Next visit: 01/11/23

## 2022-03-22 ENCOUNTER — Encounter

## 2022-03-22 MED ORDER — DIAZEPAM 5 MG PO TABS
5 MG | ORAL_TABLET | ORAL | 0 refills | Status: DC
Start: 2022-03-22 — End: 2022-04-21

## 2022-03-25 ENCOUNTER — Ambulatory Visit: Admit: 2022-03-25 | Payer: MEDICARE | Attending: Internal Medicine | Primary: Internal Medicine

## 2022-03-25 DIAGNOSIS — E785 Hyperlipidemia, unspecified: Secondary | ICD-10-CM

## 2022-03-25 NOTE — Progress Notes (Signed)
Lindsay [name] (DOB:  08-06-1955) is a 66 y.o. female,Established patient, here for evaluation of the following chief complaint(s):  Follow-up and Medication Check         ASSESSMENT/PLAN:  1. Dyslipidemia, goal LDL below 70  2. Anxiety  3. RLS (restless legs syndrome)  4. Other chronic pain  5. Recurrent depression ([place])    I am concerned that she is not consistent with antidepressants and clearly has ongoing depressive sxs.  Discussed low dose ssri not likely to contribute significantly to rls and certainly benefit is greater than risk-she thinks she is using an old antidepressant but not sure of name  Advised visit in 2 weeks with all meds using in a bag for review and discussion  Advised resume the lipitor 10 mg dose  No follow-ups on file.         Subjective   SUBJECTIVE/OBJECTIVE:  HPI  1 month follow-up.  At last visit she was not clear on which meds exactly she was using and I suggested bringing all the bottles into today's visit.  Sadly she forgot to do this and is again not clear on which pills she is taking which pills not taking.  On discussion thinks probably not using the atorvastatin which explains recent cholesterol level of 238.  Also notes she was fearful that Lexapro was causing restless legs so stopped it.  Has recently refilled the Valium and the Requip and the bupropion so she and I both think that she is using these regularly.  Has been working with neurology and now has generic Lyrica in place of horizontal and feels it its helpful.  Notes ongoing feeling scattered feeling down having down days very stressed about preparing for her son's wedding feels overwhelmed with the projects.  Does not have any suicidal thoughts.  Does say that she feels down.  Review of Systems       Objective   Physical Exam  Constitutional:       Appearance: Normal appearance.   HENT:      Head: Normocephalic and atraumatic.   Cardiovascular:      Rate and Rhythm: Normal rate and regular rhythm.   Pulmonary:       Effort: Pulmonary effort is normal.      Breath sounds: Normal breath sounds.   Musculoskeletal:      Right lower leg: No edema.      Left lower leg: No edema.   Neurological:      General: No focal deficit present.      Mental Status: She is alert and oriented to person, place, and time.      Comments: Seems scattered and trouble following meds and which ones using   Psychiatric:         Behavior: Behavior normal.                  An electronic signature was used to authenticate this note.    --Lindsay Drown, MD

## 2022-03-25 NOTE — Patient Instructions (Signed)
Please see me in the office in 2 weeks with all of the meds that  you are currently using in a bag so that I can help with managing your medications  Please stay on your current  meds until you see me in 2 weeks thank you

## 2022-04-02 ENCOUNTER — Encounter

## 2022-04-02 MED ORDER — ATORVASTATIN CALCIUM 10 MG PO TABS
10 MG | ORAL_TABLET | ORAL | 0 refills | Status: AC
Start: 2022-04-02 — End: 2022-05-11

## 2022-04-06 ENCOUNTER — Encounter

## 2022-04-06 MED ORDER — BUPROPION HCL ER (XL) 150 MG PO TB24
150 MG | ORAL_TABLET | ORAL | 0 refills | Status: DC
Start: 2022-04-06 — End: 2022-05-20

## 2022-04-13 ENCOUNTER — Inpatient Hospital Stay: Admit: 2022-04-13 | Payer: MEDICARE | Primary: Internal Medicine

## 2022-04-13 ENCOUNTER — Ambulatory Visit: Admit: 2022-04-13 | Discharge: 2022-04-13 | Payer: MEDICARE | Attending: Internal Medicine | Primary: Internal Medicine

## 2022-04-13 DIAGNOSIS — R0781 Pleurodynia: Secondary | ICD-10-CM

## 2022-04-13 DIAGNOSIS — F32A Depression, unspecified: Secondary | ICD-10-CM

## 2022-04-13 MED ORDER — ESCITALOPRAM OXALATE 5 MG PO TABS
5 MG | ORAL_TABLET | Freq: Every day | ORAL | 1 refills | Status: AC
Start: 2022-04-13 — End: 2022-06-16

## 2022-04-13 NOTE — Progress Notes (Signed)
Lindsay [name] (DOB:  1955-11-16) is a 66 y.o. female,Established patient, here for evaluation of the following chief complaint(s):  Follow-up, Anxiety, and Fall (Patient had fall last Thursday and then 8 days before that, complains of right side and rib pain. )         ASSESSMENT/PLAN:  1. Anxiety and depression  2. Polypharmacy  3. RLS (restless legs syndrome)  4. Rib pain on left side  -     XR RIBS LEFT INCLUDE CHEST (MIN 3 VIEWS); Future  5. Fall, initial encounter  6. Severe episode of recurrent major depressive disorder, without psychotic features ([place])  -     escitalopram (LEXAPRO) 5 MG tablet; Take 1 tablet by mouth daily, Disp-30 tablet, R-1Normal  With Jaquasha's  permission I am throwing out lexapro 10 and duloxetine 60 so that she will not accidentally take the wrong med and agrees to lexapro 5  Appt in 1 mo with meds    Return in about 4 weeks (around 05/11/2022).         Subjective   SUBJECTIVE/OBJECTIVE:  Anxiety        Fall      Follow-up from visit September 21.  Since that time she has had 2 falls one involved landing on left side of chest and has visible bruising and pain around ribs.  No pleuritic pain.    Polypharmacy brings in a bag of all of her current pills this includes both Lexapro 10 and duloxetine 60 mg and on discussion she tells me that she is sometimes taking 1 and sometimes taking the other at night.  Notes that it was hard to cut the Lexapro 10 and half so when she uses it she takes a whole pill.  Clearly has not been using it regularly though.  Does bring in the bupropion 150 the diazepam 5 the Requip 1 mg and the pregabalin 100 mg and seems to be taking those more consistently.  Continues to express concern about her restless legs but as far as I can tell this has not really escalated.  Continues to be quite anxious about wedding which is now a week away.  Review of Systems       Objective   Physical Exam  Constitutional:       Appearance: Normal appearance.   HENT:      Head:  Normocephalic and atraumatic.   Cardiovascular:      Rate and Rhythm: Normal rate and regular rhythm.   Pulmonary:      Effort: Pulmonary effort is normal.      Breath sounds: Normal breath sounds.   Musculoskeletal:      Right lower leg: No edema.      Left lower leg: No edema.      Comments: Visible bruising of left lower ribs  No  vertebral point paini   Neurological:      General: No focal deficit present.      Mental Status: She is alert and oriented to person, place, and time.   Psychiatric:         Behavior: Behavior normal.                  An electronic signature was used to authenticate this note.    --Mellody Drown, MD

## 2022-04-13 NOTE — Patient Instructions (Signed)
Please start the lexapro  5 mg at bedtime every evening  Please see me again with all of your bottles in 1 month

## 2022-04-14 ENCOUNTER — Encounter

## 2022-04-14 MED ORDER — DOXYCYCLINE HYCLATE 100 MG PO TABS
100 MG | ORAL_TABLET | Freq: Two times a day (BID) | ORAL | 0 refills | Status: AC
Start: 2022-04-14 — End: 2022-04-24

## 2022-04-21 ENCOUNTER — Encounter

## 2022-04-21 MED ORDER — DIAZEPAM 5 MG PO TABS
5 MG | ORAL_TABLET | ORAL | 5 refills | Status: DC
Start: 2022-04-21 — End: 2022-10-27

## 2022-05-02 ENCOUNTER — Emergency Department: Admit: 2022-05-03 | Payer: MEDICARE | Primary: Internal Medicine

## 2022-05-02 DIAGNOSIS — T733XXA Exhaustion due to excessive exertion, initial encounter: Secondary | ICD-10-CM

## 2022-05-02 NOTE — ED Provider Notes (Signed)
[facility]  EMERGENCY DEPARTMENT HISTORY AND PHYSICAL EXAM      Date: 05/02/2022  Patient Name: Lindsay [name]  MRN: [id number]  [place] 09-20-1955  Date of evaluation: 05/02/2022  Provider: Keane Police, MD   Note Started: 10:23 PM EDT 05/02/22    HISTORY OF PRESENT ILLNESS     Chief Complaint   Patient presents with    Fall     Pt report she had fallen twice in September and October and landed on her left lung. Pt was evaluated by PCP who started to treat her for PNA. Pt report she's been feeling really weak, lightheaded, and having pain on her left side x 1 week    Fatigue       History Provided By: Patient    HPI: Lindsay [name] is a 66 y.o. female who conveys that she has had 5 blood clots in the past presents with diffuse fatigue and weakness and some mild left-sided rib pain.  Patient says she had a couple of falls in September and October onto the left side was told that she had bruising.  She is not on a blood thinning medication.  She is says increased fatigue over the past day made worse with activity relieved minimally with rest.  Denies any fever, chills, nausea, vomiting, chest pain, rash, diarrhea, headache, night sweats.    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[place])     Other and unspecified hyperlipidemia 12/27/2012    PE (pulmonary embolism) 12/2012    RLS (restless legs syndrome) 03/16/2016    Dr Roseanne Reno    TIA (transient ischemic attack) 03/2016    Right facial droop, numbness in lip    Torn rotator cuff     right       Past Surgical History:  Past Surgical History:   Procedure Laterality Date    APPENDECTOMY      BREAST SURGERY  2002    AUGMENTATION    CESAREAN  SECTION      x4    GI      COLONOSCOPY    IMPLANT BREAST SILICONE/EQ  3570    Bilateral breast implants    IR KYPHOPLASTY THORACIC FIRST LEVEL  12/12/2019    IR KYPHOPLASTY THORACIC FIRST LEVEL 12/12/2019 [facility]    IR KYPHOPLASTY THORACIC FIRST LEVEL  12/12/2019    LUMBAR FUSION  06/02/2021    ORTHOPEDIC SURGERY  08/2004    rotater cuff r. shoulder    ORTHOPEDIC SURGERY      left ankle fracture treated with air cast    ORTHOPEDIC SURGERY  12/2011    left shoulder fxt and dislocation    OTHER SURGICAL HISTORY  2010    left foot torn tendon    TOTAL KNEE ARTHROPLASTY Bilateral 10/29/2015    TUBAL LIGATION         Family History:  Family History   Problem Relation Age of Onset    Anesth Problems Neg Hx  No Known Problems Son     No Known Problems Daughter     No Known Problems Daughter     Heart Disease Mother     Other Brother         COVID    Rheum Arthritis Sister     Cancer Father         thyroid cancer    Heart Disease Father     No Known Problems Daughter        Social History:  Social History     Tobacco Use    Smoking status: Never    Smokeless tobacco: Never   Vaping Use    Vaping Use: Never used   Substance Use Topics    Alcohol use: No    Drug use: No       Allergies:  No Known Allergies    PCP: Flora Lipps, MD    Current Meds:   No current facility-administered medications for this encounter.     Current Outpatient Medications   Medication Sig Dispense Refill    diazePAM (VALIUM) 5 MG tablet TAKE 1 TABLET BY MOUTH EVERY MORNING AND TAKE 2 TABLETS EVERY EVENING 90 tablet 5    escitalopram (LEXAPRO) 5 MG tablet Take 1 tablet by mouth daily 30 tablet 1    buPROPion (WELLBUTRIN XL) 150 MG extended release tablet TAKE 1 TABLET BY MOUTH EVERY DAY IN THE MORNING 30 tablet 0    atorvastatin (LIPITOR) 10 MG tablet TAKE 1 TABLET BY MOUTH EVERY DAY 30 tablet 0    rOPINIRole (REQUIP) 1 MG tablet TAKE 1 TABLET AT 1-2PM AND TAKE 3 TABLETS BY MOUTH 1 TO 3 HOURS BEFORE BEDTIME EVERYDAY 360 tablet 2     pregabalin (LYRICA) 100 MG capsule Take 1 capsule by mouth 2 times daily for 180 days. Max Daily Amount: 200 mg 180 capsule 1    ergocalciferol (ERGOCALCIFEROL) 1.25 MG (50000 UT) capsule       acetaminophen (TYLENOL) 325 MG tablet Take by mouth every 4 hours as needed      aspirin 81 MG chewable tablet Take by mouth daily      vitamin D3 (CHOLECALCIFEROL) 125 MCG (5000 UT) TABS tablet TAKE 1 TABLET BY MOUTH DAILY      naloxone 4 MG/0.1ML LIQD nasal spray Use 1 spray intranasally, then discard. Repeat with new spray every 2 min as needed for opioid overdose symptoms, alternating nostrils. (Patient not taking: Reported on 01/26/2022)      omeprazole (PRILOSEC OTC) 20 MG tablet Take by mouth every evening         Social Determinants of Health:   Social Determinants of Health     Tobacco Use: Low Risk  (04/13/2022)    Patient History     Smoking Tobacco Use: Never     Smokeless Tobacco Use: Never     Passive Exposure: Not on file   Alcohol Use: Not At Risk (05/02/2022)    AUDIT-C     Frequency of Alcohol Consumption: Never     Average Number of Drinks: Patient does not drink     Frequency of Binge Drinking: Never   Financial Resource Strain: Not on file   Food Insecurity: Not on file   Transportation Needs: Not on file   Physical Activity: Not on file   Stress: Not on file   Social Connections: Not on file   Intimate Partner Violence: Not on file   Depression: Not at risk (01/26/2022)    PHQ-2  PHQ-2 Score: 2   Housing Stability: Not on file       PHYSICAL EXAM   Physical Exam  Constitutional:       Appearance: Normal appearance.   HENT:      Head: Normocephalic and atraumatic.      Right Ear: External ear normal.      Left Ear: External ear normal.      Nose: Nose normal.      Mouth/Throat:      Mouth: Mucous membranes are moist.   Eyes:      Extraocular Movements: Extraocular movements intact.      Conjunctiva/sclera: Conjunctivae normal.      Pupils: Pupils are equal, round, and reactive to light.   Cardiovascular:       Rate and Rhythm: Normal rate and regular rhythm.      Pulses: Normal pulses.      Heart sounds: Normal heart sounds.   Pulmonary:      Effort: Pulmonary effort is normal.      Breath sounds: Normal breath sounds.   Abdominal:      General: Abdomen is flat. Bowel sounds are normal.      Palpations: Abdomen is soft.   Musculoskeletal:         General: No swelling, tenderness or signs of injury. Normal range of motion.      Cervical back: Normal range of motion and neck supple.   Skin:     General: Skin is warm and dry.      Capillary Refill: Capillary refill takes less than 2 seconds.   Neurological:      General: No focal deficit present.      Mental Status: She is alert and oriented to person, place, and time.   Psychiatric:         Mood and Affect: Mood normal.         Behavior: Behavior normal.         Thought Content: Thought content normal.         Judgment: Judgment normal.           SCREENINGS                   LAB, EKG AND DIAGNOSTIC RESULTS   Labs:  Recent Results (from the past 12 hour(s))   CBC with Auto Differential    Collection Time: 05/02/22 10:26 PM   Result Value Ref Range    WBC 9.2 3.6 - 11.0 K/uL    RBC 4.27 3.80 - 5.20 M/uL    Hemoglobin 10.1 (L) 11.5 - 16.0 g/dL    Hematocrit 32.6 (L) 35.0 - 47.0 %    MCV 76.3 (L) 80.0 - 99.0 FL    MCH 23.7 (L) 26.0 - 34.0 PG    MCHC 31.0 30.0 - 36.5 g/dL    RDW 16.9 (H) 11.5 - 14.5 %    Platelets 288 150 - 400 K/uL    MPV 9.7 8.9 - 12.9 FL    Nucleated RBCs 0.0 0 PER 100 WBC    nRBC 0.00 0.00 - 0.01 K/uL    Neutrophils % 52 32 - 75 %    Lymphocytes % 36 12 - 49 %    Monocytes % 7 5 - 13 %    Eosinophils % 4 0 - 7 %    Basophils % 1 0 - 1 %    Immature Granulocytes 0 0.0 - 0.5 %    Neutrophils Absolute 4.8 1.8 - 8.0  K/UL    Lymphocytes Absolute 3.3 0.8 - 3.5 K/UL    Monocytes Absolute 0.7 0.0 - 1.0 K/UL    Eosinophils Absolute 0.3 0.0 - 0.4 K/UL    Basophils Absolute 0.1 0.0 - 0.1 K/UL    Absolute Immature Granulocyte 0.0 0.00 - 0.04 K/UL    Differential  Type AUTOMATED     Comprehensive Metabolic Panel    Collection Time: 05/02/22 10:26 PM   Result Value Ref Range    Sodium 138 136 - 145 mmol/L    Potassium 4.0 3.5 - 5.1 mmol/L    Chloride 106 97 - 108 mmol/L    CO2 25 21 - 32 mmol/L    Anion Gap 7 5 - 15 mmol/L    Glucose 123 (H) 65 - 100 mg/dL    BUN 15 6 - 20 MG/DL    Creatinine 1.23 (H) 0.55 - 1.02 MG/DL    Bun/Cre Ratio 12 12 - 20      Est, Glom Filt Rate 48 (L) >60 ml/min/1.51m    Calcium 8.6 8.5 - 10.1 MG/DL    Total Bilirubin 0.2 0.2 - 1.0 MG/DL    ALT 26 12 - 78 U/L    AST 22 15 - 37 U/L    Alk Phosphatase 157 (H) 45 - 117 U/L    Total Protein 7.5 6.4 - 8.2 g/dL    Albumin 3.7 3.5 - 5.0 g/dL    Globulin 3.8 2.0 - 4.0 g/dL    Albumin/Globulin Ratio 1.0 (L) 1.1 - 2.2     Troponin    Collection Time: 05/02/22 10:26 PM   Result Value Ref Range    Troponin, High Sensitivity 6 0 - 51 ng/L   Extra Tubes Hold    Collection Time: 05/02/22 10:26 PM   Result Value Ref Range    Specimen HOld BLUE     Comment:        Add-on orders for these samples will be processed based on acceptable specimen integrity and analyte stability, which may vary by analyte.   EKG 12 Lead    Collection Time: 05/02/22 10:30 PM   Result Value Ref Range    Ventricular Rate 82 BPM    Atrial Rate 82 BPM    P-R Interval 132 ms    QRS Duration 76 ms    Q-T Interval 386 ms    QTc Calculation (Bazett) 450 ms    P Axis 66 degrees    R Axis 69 degrees    T Axis 54 degrees    Diagnosis       Normal sinus rhythm  Normal ECG  When compared with ECG of 10-Jan-2021 17:15,  No significant change was found     Urinalysis    Collection Time: 05/02/22 10:54 PM   Result Value Ref Range    Color, UA YELLOW/STRAW      Appearance CLEAR CLEAR      Specific Gravity, UA 1.010      pH, Urine 5.5 5.0 - 8.0      Protein, UA Negative NEG mg/dL    Glucose, UA Negative NEG mg/dL    Ketones, Urine Negative NEG mg/dL    Bilirubin Urine Negative NEG      Blood, Urine Negative NEG      Urobilinogen, Urine 0.2 0.2 - 1.0 EU/dL     Nitrite, Urine Negative NEG      Leukocyte Esterase, Urine SMALL (A) NEG      WBC, UA 5-10 0 -  4 /hpf    RBC, UA 0-5 0 - 5 /hpf    Epithelial Cells UA FEW FEW /lpf    BACTERIA, URINE Negative NEG /hpf    Hyaline Casts, UA 0-2 0 - 2 /lpf   Urine Culture Hold Sample    Collection Time: 05/02/22 10:54 PM    Specimen: Urine   Result Value Ref Range    Specimen HOld        Urine on hold in Microbiology dept for 2 days.  If unpreserved urine is submitted, it cannot be used for addtional testing after 24 hours, recollection will be required.       EKG:.EKG interpreted by me. Shows Normal Sinus Rhythm with a HR of 82.  No STEMI.    Radiologic Studies:  Non-plain film images such as CT, Ultrasound and MRI are read by the radiologist. Plain radiographic images are visualized and preliminarily interpreted by the ED Provider with the following findings: CT Interpreted by me.  Shows no appreciable acute process    Interpretation per the Radiologist below, if available at the time of this note:  CTA CHEST W WO CONTRAST   Final Result   There is no pulmonary embolism.   There is no aortic aneurysm or dissection.   Likely acute T8 compression fracture. Correlation with MRI of the thoracic line   is recommended.. Incidental findings are as described above.                    EMERGENCY DEPARTMENT COURSE and DIFFERENTIAL DIAGNOSIS/MDM   CC/HPI Summary, DDx, ED Course, and Reassessment: Unclear etiology for patient's symptoms.  Given her history of pulmonary embolisms will assess for PE as well as ACS, arrhythmia, dehydration, electrolyte abnormality.    Clinical Management Tools:      Records Reviewed (source and summary of external notes): Prior medical records and Nursing notes    Vitals:    Vitals:    05/02/22 2302 05/02/22 2317 05/02/22 2337 05/02/22 2338   BP: 118/75 113/67 106/63    Pulse: 77 79  78   Resp: _0 Temp:       TempSrc:       SpO2: 96% 92%  91%   Weight:       Height:            ED COURSE  ED Course as of  05/02/22 2353   Sun May 02, 2022   2350 No appreciable acute process on CT scan.  Unclear etiology for what the patient is feeling.  But no acute signs of impending [CS]   2350  deterioration. [CS]      ED Course User Index  [CS] Elder Cyphers, MD       Disposition Considerations (Tests not done, Shared Decision Making, Pt Expectation of Test or Treatment.):  No evidence of acute pulmonary embolism and negative for acute coronary syndrome.  Unclear etiology for patient's symptoms are likely viral syndrome at this point time however treat symptomatically as an outpatient.     Patient was given the following medications:  Medications   iopamidol (ISOVUE-370) 76 % injection 100 mL (75 mLs IntraVENous Given 05/02/22 2326)       CONSULTS: (Who and What was discussed)  None     Social Determinants affecting Dx or Tx: None    Smoking Cessation: Not Applicable    PROCEDURES   Unless otherwise noted above, none  Procedures      CRITICAL  CARE TIME   Patient does not meet Critical Care Time, 0 minutes    ED FINAL IMPRESSION     1. Fatigue due to excessive exertion, initial encounter          DISPOSITION/PLAN   DISPOSITION Decision To Discharge 05/02/2022 11:52:30 PM    Discharge Note: The patient is stable for discharge home. The signs, symptoms, diagnosis, and discharge instructions have been discussed, understanding conveyed, and agreed upon. The patient is to follow up as recommended or return to ER should their symptoms worsen.      PATIENT REFERRED TO:  Flora Lipps, MD  [address] [facility]  [address]  [place] [address]  [phone]    Call in 2 days  As needed, If symptoms worsen        DISCHARGE MEDICATIONS:     Medication List        ASK your doctor about these medications      acetaminophen 325 MG tablet  Commonly known as: TYLENOL     aspirin 81 MG chewable tablet     atorvastatin 10 MG tablet  Commonly known as: LIPITOR  TAKE 1 TABLET BY MOUTH EVERY DAY     buPROPion 150 MG extended release  tablet  Commonly known as: WELLBUTRIN XL  TAKE 1 TABLET BY MOUTH EVERY DAY IN THE MORNING     diazePAM 5 MG tablet  Commonly known as: VALIUM  TAKE 1 TABLET BY MOUTH EVERY MORNING AND TAKE 2 TABLETS EVERY EVENING     ergocalciferol 1.25 MG (50000 UT) capsule  Commonly known as: ERGOCALCIFEROL     escitalopram 5 MG tablet  Commonly known as: LEXAPRO  Take 1 tablet by mouth daily     naloxone 4 MG/0.1ML Liqd nasal spray     omeprazole 20 MG tablet  Commonly known as: PRILOSEC OTC     pregabalin 100 MG capsule  Commonly known as: LYRICA  Take 1 capsule by mouth 2 times daily for 180 days. Max Daily Amount: 200 mg     rOPINIRole 1 MG tablet  Commonly known as: REQUIP  TAKE 1 TABLET AT 1-2PM AND TAKE 3 TABLETS BY MOUTH 1 TO 3 HOURS BEFORE BEDTIME EVERYDAY     vitamin D3 125 MCG (5000 UT) Tabs tablet  Commonly known as: CHOLECALCIFEROL                DISCONTINUED MEDICATIONS:  Current Discharge Medication List          I am the Primary Clinician of Record. Keane Police, MD (electronically signed)    (Please note that parts of this dictation were completed with voice recognition software. Quite often unanticipated grammatical, syntax, homophones, and other interpretive errors are inadvertently transcribed by the computer software. Please disregards these errors. Please excuse any errors that have escaped final proofreading.)     Elder Cyphers, MD  05/02/22 2353

## 2022-05-02 NOTE — ED Notes (Signed)
PT off the floor to [place], [place], RN  05/02/22 2320

## 2022-05-02 NOTE — ED Notes (Signed)
MD Zack Seal reviewed discharge instructions with the patient. The pt/family verbalized understanding. All questions and concerns were addressed. The patient is discharged with papers in hand.  Pt is alert and oriented x 4. Respirations are clear and unlabored. IV removed.       Andres Labrum, RN  05/02/22 [phone]

## 2022-05-03 ENCOUNTER — Inpatient Hospital Stay
Admit: 2022-05-03 | Discharge: 2022-05-03 | Disposition: A | Discharge status: Home / Self Care | Payer: MEDICARE | Attending: Emergency Medicine

## 2022-05-03 LAB — EKG 12-LEAD
Atrial Rate: 82 {beats}/min
Diagnosis: NORMAL
P Axis: 66 degrees
P-R Interval: 132 ms
Q-T Interval: 386 ms
QRS Duration: 76 ms
QTc Calculation (Bazett): 450 ms
R Axis: 69 degrees
T Axis: 54 degrees
Ventricular Rate: 82 {beats}/min

## 2022-05-03 LAB — CBC WITH AUTO DIFFERENTIAL
Absolute Immature Granulocyte: 0 10*3/uL (ref 0.00–0.04)
Basophils %: 1 % (ref 0–1)
Basophils Absolute: 0.1 10*3/uL (ref 0.0–0.1)
Eosinophils %: 4 % (ref 0–7)
Eosinophils Absolute: 0.3 10*3/uL (ref 0.0–0.4)
Hematocrit: 32.6 % — ABNORMAL LOW (ref 35.0–47.0)
Hemoglobin: 10.1 g/dL — ABNORMAL LOW (ref 11.5–16.0)
Immature Granulocytes: 0 % (ref 0.0–0.5)
Lymphocytes %: 36 % (ref 12–49)
Lymphocytes Absolute: 3.3 10*3/uL (ref 0.8–3.5)
MCH: 23.7 PG — ABNORMAL LOW (ref 26.0–34.0)
MCHC: 31 g/dL (ref 30.0–36.5)
MCV: 76.3 FL — ABNORMAL LOW (ref 80.0–99.0)
MPV: 9.7 FL (ref 8.9–12.9)
Monocytes %: 7 % (ref 5–13)
Monocytes Absolute: 0.7 10*3/uL (ref 0.0–1.0)
Neutrophils %: 52 % (ref 32–75)
Neutrophils Absolute: 4.8 10*3/uL (ref 1.8–8.0)
Nucleated RBCs: 0 PER 100 WBC
Platelets: 288 10*3/uL (ref 150–400)
RBC: 4.27 M/uL (ref 3.80–5.20)
RDW: 16.9 % — ABNORMAL HIGH (ref 11.5–14.5)
WBC: 9.2 10*3/uL (ref 3.6–11.0)
nRBC: 0 10*3/uL (ref 0.00–0.01)

## 2022-05-03 LAB — COMPREHENSIVE METABOLIC PANEL
ALT: 26 U/L (ref 12–78)
AST: 22 U/L (ref 15–37)
Albumin/Globulin Ratio: 1 — ABNORMAL LOW (ref 1.1–2.2)
Albumin: 3.7 g/dL (ref 3.5–5.0)
Alk Phosphatase: 157 U/L — ABNORMAL HIGH (ref 45–117)
Anion Gap: 7 mmol/L (ref 5–15)
BUN: 15 MG/DL (ref 6–20)
Bun/Cre Ratio: 12 (ref 12–20)
CO2: 25 mmol/L (ref 21–32)
Calcium: 8.6 MG/DL (ref 8.5–10.1)
Chloride: 106 mmol/L (ref 97–108)
Creatinine: 1.23 MG/DL — ABNORMAL HIGH (ref 0.55–1.02)
Est, Glom Filt Rate: 48 mL/min/{1.73_m2} — ABNORMAL LOW (ref >60)
Globulin: 3.8 g/dL (ref 2.0–4.0)
Glucose: 123 mg/dL — ABNORMAL HIGH (ref 65–100)
Potassium: 4 mmol/L (ref 3.5–5.1)
Sodium: 138 mmol/L (ref 136–145)
Total Bilirubin: 0.2 MG/DL (ref 0.2–1.0)
Total Protein: 7.5 g/dL (ref 6.4–8.2)

## 2022-05-03 LAB — URINALYSIS
BACTERIA, URINE: NEGATIVE /hpf
Bilirubin Urine: NEGATIVE
Blood, Urine: NEGATIVE
Glucose, UA: NEGATIVE mg/dL
Ketones, Urine: NEGATIVE mg/dL
Nitrite, Urine: NEGATIVE
Protein, UA: NEGATIVE mg/dL
Specific Gravity, UA: 1.01
Urobilinogen, Urine: 0.2 EU/dL (ref 0.2–1.0)
pH, Urine: 5.5 (ref 5.0–8.0)

## 2022-05-03 LAB — EXTRA TUBES HOLD

## 2022-05-03 LAB — URINE CULTURE HOLD SAMPLE

## 2022-05-03 LAB — TROPONIN: Troponin, High Sensitivity: 6 ng/L (ref 0–51)

## 2022-05-03 MED ORDER — IOPAMIDOL 76 % IV SOLN
76 % | Freq: Once | INTRAVENOUS | Status: AC | PRN
Start: 2022-05-03 — End: 2022-05-02
  Administered 2022-05-03: 03:00:00 75 mL via INTRAVENOUS

## 2022-05-03 MED FILL — ISOVUE-370 76 % IV SOLN: 76 % | INTRAVENOUS | Qty: 100

## 2022-05-06 ENCOUNTER — Encounter

## 2022-05-06 MED ORDER — PREGABALIN 100 MG PO CAPS
100 MG | ORAL_CAPSULE | Freq: Two times a day (BID) | ORAL | 1 refills | Status: AC
Start: 2022-05-06 — End: 2022-11-02

## 2022-05-06 NOTE — Telephone Encounter (Addendum)
Patient stated she has lost bottle of pregabalin (LYRICA) 100 MG capsule and is requesting a refill be sent to [facility]. States she has been out of medication for a week.

## 2022-05-07 NOTE — Telephone Encounter (Signed)
Patient is calling to ask for our office's assistance in obtaining a refill of pregabalin. Patient states that [facility] told her they cannot fill it unless the doctor's office calls in to request it.

## 2022-05-07 NOTE — Telephone Encounter (Signed)
Called patient. No answer. Left a VM stating the doctor sent in a new script.

## 2022-05-07 NOTE — Telephone Encounter (Signed)
Spoke to [facility]ett Billow). Stated that we have given the okay to refill early due to patient losing her medication. Janett Billow will push the script through.

## 2022-05-10 NOTE — Telephone Encounter (Signed)
Called patient. No answer. Left a VM stating I called pharmacy back on 05/07/22 to give them the okay to fill early. If any concerns or questions, she can call us back.

## 2022-05-10 NOTE — Telephone Encounter (Signed)
Patient called and left a message on [facility] 05/08/22 regarding her medication Pregablin. Stated the pharmacy told her the doctor has to call it in.

## 2022-05-10 NOTE — Telephone Encounter (Signed)
Patient would like a call back from the nurse as she is still having issues obtaining her pregablin.

## 2022-05-10 NOTE — Telephone Encounter (Signed)
Called pharmacy to confirm that I called on 05/07/22 and spoke to [place] about refilling early. Pharmacist stated that it was originally denied by insurance but it got pushed through. They are filling the script. Called patient and inform her of this information.

## 2022-05-11 ENCOUNTER — Encounter

## 2022-05-11 MED ORDER — ATORVASTATIN CALCIUM 10 MG PO TABS
10 MG | ORAL_TABLET | ORAL | 0 refills | Status: DC
Start: 2022-05-11 — End: 2022-06-07

## 2022-05-13 ENCOUNTER — Ambulatory Visit: Payer: MEDICARE | Attending: Internal Medicine | Primary: Internal Medicine

## 2022-05-14 NOTE — Telephone Encounter (Signed)
PSR attempted to reach out to patient to inform of appointment - no answer, left detailed VM for call back but left date/time on message

## 2022-05-14 NOTE — Telephone Encounter (Signed)
She is scheduled for Mon 11/20 at 11:30. Please notify.

## 2022-05-14 NOTE — Telephone Encounter (Signed)
-----   Message from John Giovanni sent at 05/13/2022  1:27 PM EST -----  Subject: Appointment Request    Reason for Call: Established Patient Appointment needed: Routine Existing   Condition Follow Up    QUESTIONS    Reason for appointment request? Available appointments did not meet   patient need     Additional Information for Provider? Patient is asking if January is to   far out to be seen for her follow up. Not showing anything in November,   the appointments in December are too close to Christmas.  ---------------------------------------------------------------------------  --------------  Lindsay [name]  [phone]; OK to leave message on voicemail  ---------------------------------------------------------------------------  --------------  SCRIPT ANSWERS

## 2022-05-20 ENCOUNTER — Encounter

## 2022-05-20 MED ORDER — BUPROPION HCL ER (XL) 150 MG PO TB24
150 MG | ORAL_TABLET | ORAL | 0 refills | Status: DC
Start: 2022-05-20 — End: 2022-06-15

## 2022-05-24 ENCOUNTER — Ambulatory Visit: Admit: 2022-05-24 | Discharge: 2022-07-28 | Payer: MEDICARE | Attending: Internal Medicine | Primary: Internal Medicine

## 2022-05-24 DIAGNOSIS — F419 Anxiety disorder, unspecified: Secondary | ICD-10-CM

## 2022-05-24 NOTE — Progress Notes (Signed)
Lindsay [name] (DOB:  03/16/1956) is a 66 y.o. female,Established patient, here for evaluation of the following chief complaint(s):  Medication Check         ASSESSMENT/PLAN:  1. Anxiety  2. Severe episode of recurrent major depressive disorder, without psychotic features ([place])  3. Dyslipidemia, goal LDL below 100    I am advising cont the lexparo 5 mg and wellbutrin 150 mg and appt in 91mo  No follow-ups on file.         Subjective   SUBJECTIVE/OBJECTIVE:  HPI  Seen at 1 month follow-up.  At last visit we agreed to continue with Lyrica 5 mg daily along with the bupropion 150 mg in the morning.  Her son did get married on October 21.  She notes right before the wedding she ran out of her Lyrica and it was hard to get it refilled she was off of it for about 3 weeks.  She notes that during this time her restless legs did get worse.  She is therefore not able to assess if the leg agitation was from being off the Lyrica or from being on the Lexapro consistently.  She does note that most nights she is actually sleeping well.  Denies any panic episodes.  Or suicidal thoughts.  Feels some sadness now that the wedding is over.  Sadness over her relationship with her sister.  Review of Systems       Objective   Physical Exam  Constitutional:       Appearance: Normal appearance.   HENT:      Head: Normocephalic and atraumatic.   Cardiovascular:      Rate and Rhythm: Normal rate and regular rhythm.   Pulmonary:      Effort: Pulmonary effort is normal.      Breath sounds: Normal breath sounds.   Musculoskeletal:      Right lower leg: No edema.      Left lower leg: No edema.   Neurological:      General: No focal deficit present.      Mental Status: She is alert and oriented to person, place, and time.   Psychiatric:         Behavior: Behavior normal.                  An electronic signature was used to authenticate this note.    --Mellody Drown, MD

## 2022-06-06 ENCOUNTER — Encounter

## 2022-06-07 MED ORDER — ATORVASTATIN CALCIUM 10 MG PO TABS
10 | ORAL_TABLET | ORAL | 1 refills | Status: DC
Start: 2022-06-07 — End: 2023-02-14

## 2022-06-15 ENCOUNTER — Telehealth

## 2022-06-15 MED ORDER — BUPROPION HCL ER (XL) 150 MG PO TB24
150 MG | ORAL_TABLET | Freq: Every morning | ORAL | 2 refills | Status: AC
Start: 2022-06-15 — End: 2022-07-08

## 2022-06-15 NOTE — Telephone Encounter (Signed)
Msg placed on rx sig to remind pt to call to make an appt to prevent a delay in future refill requests     Message sent to amber for appt reminder

## 2022-06-16 ENCOUNTER — Encounter

## 2022-06-16 MED ORDER — ESCITALOPRAM OXALATE 5 MG PO TABS
5 MG | ORAL_TABLET | Freq: Every day | ORAL | 1 refills | Status: DC
Start: 2022-06-16 — End: 2022-08-16

## 2022-06-16 NOTE — Telephone Encounter (Signed)
Chief Complaint   Patient presents with    Medication Refill       Requested Prescriptions     Pending Prescriptions Disp Refills    escitalopram (LEXAPRO) 5 MG tablet [Pharmacy Med Name: ESCITALOPRAM 5 MG TABLET] 30 tablet 1     Sig: TAKE 1 TABLET BY MOUTH EVERY DAY       Allergies:  No Known Allergies    Last visit with clinic:  05/24/2022   Next visit with clinic: Visit date not found     Last visit with this provider: 05/24/2022   Next Visit with this provider: Visit date not found    Signed by Brien Mates CCT  06/16/22  9:49 AM

## 2022-06-16 NOTE — Telephone Encounter (Signed)
PSR attempted to reach out to patient to schedule per provider - no answer, left detailed VM for call back

## 2022-07-08 ENCOUNTER — Encounter

## 2022-07-08 MED ORDER — BUPROPION HCL ER (XL) 150 MG PO TB24
150 MG | ORAL_TABLET | Freq: Every morning | ORAL | 2 refills | Status: DC
Start: 2022-07-08 — End: 2022-10-18

## 2022-07-08 NOTE — Telephone Encounter (Signed)
Mychart message sent encouraging pt to schedule AWV in feb   Chief Complaint   Patient presents with    Medication Refill       Requested Prescriptions     Pending Prescriptions Disp Refills    buPROPion (WELLBUTRIN XL) 150 MG extended release tablet 30 tablet 2     Sig: Take 1 tablet by mouth every morning Appt needed in Feb 2024       Allergies:  No Known Allergies    Last visit with clinic:  05/24/2022   Next visit with clinic: Visit date not found     Last visit with this provider: 05/24/2022   Next Visit with this provider: Visit date not found    Signed by Brien Mates CCT  07/08/22  2:57 PM

## 2022-07-26 NOTE — Telephone Encounter (Signed)
V/m left for return call. Also suggested she place a message in [place] with details if she is able.

## 2022-07-26 NOTE — Telephone Encounter (Signed)
Patient was calling back to speak with nurse regarding TRINTELLIX and SPRAVATO as prescribed by her Physiatrist [has appointment next week]. She is also concerned with the price of both medications also. She is more concerned with the nasal spray at the time as this is very close to her brain.     Patient states when she goes to the appointment the nasal spray could cause some dizziness when taken so she has to sit in his office for two hours to make sure she has no reaction. She is concerned as this is a newer medication on the market and values her PCPs input and would like her thoughts.

## 2022-07-26 NOTE — Telephone Encounter (Signed)
Provider's response: I think the psychiatrist is trying to help her and would go with their advice   Trintellix is a med I have experience with and it is a good option   Reassure her that worth working with psychiatry.    Detailed v/m left notifying of provider's message.

## 2022-07-26 NOTE — Telephone Encounter (Signed)
The patient is requesting a call a back to be advise about a medication a new physiatrist was trying to put her on. She stated that she don't know him or trust him and she value her PCP opinion an will like to speak to the nurse about the medication (nasal spay) [phone]

## 2022-07-27 ENCOUNTER — Ambulatory Visit: Payer: MEDICARE | Attending: Internal Medicine | Primary: Internal Medicine

## 2022-07-27 NOTE — Telephone Encounter (Signed)
Patient called and wanted to first say thank you to everyone that has assisted her. She apologized for calling and leaving a few messages. I informed the patient we are here to help. She said after spending some time today talking with the psychiatrist she feels more comfortable about taking the medication and wants to cancel her appointment with the Dr today.     Canceling appointment per patient request

## 2022-07-27 NOTE — Telephone Encounter (Signed)
Spoke with patient & given continued concerns with the meds being prescribed by psychiatry suggested a virtual visit with PCP. Patient states her daughter set up the visit with psychiatry & the office is located in [place]. She has not yet met the doctor face to face but does have an appt next week. The SPRAVATO is in a nasal spray form & the med would have to be administered at the office then she would have to be monitored for 2 hours. She is not sure she is able to do this & just wants to speak with PCP about it. Virtual visit scheduled for today at 2:15.

## 2022-07-27 NOTE — Telephone Encounter (Signed)
Patient called and explained that she did get a reply about TRINTELLIX but not about SPRAVATO, patient is concerned that it's a nose spray and the drug would be close to her brain, she is unfamiliar with both drugs but more concerned with SPRAVATO, she wants her PCP to know she does not have a relationship with the psychiatrist and he doesn't know her medical history, so she doesn't feel comfortable with his recommendation of medication, patient wants a call from her PCP, to talk with her and give her some assurance about these two drugs. She said she has not being seeing this psychiatrist so she doesn't want anyone to feel she is going against his judgement, she said she did make an appointment to talk with him next week but wants to talk to her DR first before seeing him.     Kelyse [phone]

## 2022-08-16 ENCOUNTER — Encounter

## 2022-08-16 MED ORDER — ESCITALOPRAM OXALATE 5 MG PO TABS
5 MG | ORAL_TABLET | Freq: Every day | ORAL | 0 refills | Status: AC
Start: 2022-08-16 — End: 2023-02-25

## 2022-10-18 ENCOUNTER — Encounter

## 2022-10-18 MED ORDER — BUPROPION HCL ER (XL) 150 MG PO TB24
150 MG | ORAL_TABLET | Freq: Every morning | ORAL | 0 refills | Status: AC
Start: 2022-10-18 — End: 2022-11-12

## 2022-10-27 ENCOUNTER — Encounter

## 2022-10-27 MED ORDER — DIAZEPAM 5 MG PO TABS
5 | ORAL_TABLET | ORAL | 0 refills | Status: AC
Start: 2022-10-27 — End: 2022-11-26

## 2022-11-05 ENCOUNTER — Encounter

## 2022-11-05 MED ORDER — PREGABALIN 100 MG PO CAPS
100 | ORAL_CAPSULE | Freq: Two times a day (BID) | ORAL | 1 refills | Status: DC
Start: 2022-11-05 — End: 2023-02-07

## 2022-11-12 ENCOUNTER — Encounter

## 2022-11-12 MED ORDER — BUPROPION HCL ER (XL) 150 MG PO TB24
150 MG | ORAL_TABLET | Freq: Every morning | ORAL | 0 refills | Status: AC
Start: 2022-11-12 — End: 2023-02-16

## 2022-11-26 ENCOUNTER — Encounter

## 2022-11-30 ENCOUNTER — Encounter

## 2022-11-30 MED ORDER — DIAZEPAM 5 MG PO TABS
5 | ORAL_TABLET | Freq: Three times a day (TID) | ORAL | 1 refills | Status: AC | PRN
Start: 2022-11-30 — End: 2023-01-29

## 2022-11-30 MED ORDER — ROPINIROLE HCL 1 MG PO TABS
1 MG | ORAL_TABLET | ORAL | 2 refills | Status: DC
Start: 2022-11-30 — End: 2023-05-30

## 2022-11-30 NOTE — Telephone Encounter (Signed)
Britt-can you call Lindsay [name] and let her know I sent in 90 pills and 1 refill but do need to see her for further refills  Make sure she is aware of appt in August and need to keep visit thanks

## 2022-12-31 ENCOUNTER — Encounter

## 2023-01-11 ENCOUNTER — Ambulatory Visit: Admit: 2023-01-11 | Payer: MEDICARE | Attending: Neurology | Primary: Internal Medicine

## 2023-01-11 VITALS — BP 134/86 | HR 92 | Ht 63.0 in | Wt 176.0 lb

## 2023-01-11 DIAGNOSIS — G2581 Restless legs syndrome: Principal | ICD-10-CM

## 2023-01-11 NOTE — Progress Notes (Signed)
Chief Complaint   Patient presents with    Follow-up     Restless leg syndrome     Vitals:    01/11/23 1100   BP: 134/86   Pulse: 92   SpO2: 97%

## 2023-01-11 NOTE — Progress Notes (Signed)
Neurology Progress Note    HISTORY PROVIDED BY: patient    Chief Complaint   Patient presents with    Follow-up     Restless leg syndrome        Subjective:   Pt is a 67y.o. right handed female last seen in July 2023   She has h/o radiographic changes c/w MS, but no clinical symptoms, stable MRI's since 2011, the last MRI brain 02/17/17.   Additionally, has RLS, with an ill-defined irritating sensation in her legs with urge to move her legs beginning around 6PM and keeping her awake and inhibiting her activities, though question if there may be some component of psych issues/akasthesia contributing to her need to walk.   RLS reasonably well controlled on pregabalin 100 mg twice daily and Requip 4mg  daily.  She used to be on [employer] but insurance stopped covering it.  Gabapentin did not help.  Sometimes she will start to feel uncomfortable in her legs during the day and she will take her evening dose earlier.    She has had fusion surgery from T11-L2 due to fractured vertebra related to a fall.   She is taking Cymbalta, Wellbutrin, and Valium 5 mg in AM and 10mg  in PM.     Previous testing:  In past, c/o memory loss, suspected due to underlying mood disorder/personality d/o, and stress, and this suspicion was confirmed on neuropsychological testing with Dr. Maren Reamer in April, 2019.    Current Outpatient Medications   Medication Sig    rOPINIRole (REQUIP) 1 MG tablet TAKE 1 TABLET AT 1-2PM AND TAKE 3 TABLETS BY MOUTH 1 TO 3 HOURS BEFORE BEDTIME EVERYDAY    diazePAM (VALIUM) 5 MG tablet Take 1 tablet by mouth every 8 hours as needed for Anxiety or Sleep for up to 60 days. Max Daily Amount: 15 mg    buPROPion (WELLBUTRIN XL) 150 MG extended release tablet TAKE 1 TABLET BY MOUTH EVERY MORNING *APPT REQUIRED FOR FURTHER REFILLS*    pregabalin (LYRICA) 100 MG capsule Take 1 capsule by mouth 2 times daily for 180 days. Max Daily Amount: 200 mg    escitalopram (LEXAPRO) 5 MG tablet TAKE 1 TABLET BY MOUTH EVERY DAY     atorvastatin (LIPITOR) 10 MG tablet TAKE 1 TABLET BY MOUTH EVERY DAY    ergocalciferol (ERGOCALCIFEROL) 1.25 MG (50000 UT) capsule     acetaminophen (TYLENOL) 325 MG tablet Take by mouth every 4 hours as needed    aspirin 81 MG chewable tablet Take by mouth daily    omeprazole (PRILOSEC OTC) 20 MG tablet Take by mouth every evening    vitamin D3 (CHOLECALCIFEROL) 125 MCG (5000 UT) TABS tablet TAKE 1 TABLET BY MOUTH DAILY     No current facility-administered medications for this visit.     MRI Result (most recent):  MRI LUMBAR SPINE WO CONTRAST 12/11/2019    Narrative  EXAM: MRI THORAC SPINE WO CONT, MRI LUMB SPINE WO CONT    INDICATION: t12 compression fx on 6/3, r/o cord compression.    COMPARISON: Lumbar spine CT dated 12/06/2019    TECHNIQUE: MR imaging of the thoracic and lumbar spine was performed using the  following sequences: sagittal T1, T2, stir; axial T1, T2. Additional coronal T2  images were obtained through the lumbar spine    CONTRAST: None.    FINDINGS:    Comparison prior study, there is increased loss of height of the T12 vertebral  body with increased bony retropulsion. Superior endplate compression fracture of  L4 is also noted which is likely chronic. There is moderate severe canal  stenosis at T12 with moderate bilateral foraminal narrowing. Possible mild  compression on the spinal cord. No definite abnormal signal is identified in the  conus.    The remaining thoracic spine levels are unremarkable.    T12-L1 demonstrates minimal disc bulge without canal stenosis or foraminal  narrowing.    L1-2 demonstrates no evidence of canal stenosis or foraminal narrowing.    At L2-3, there is mild facet hypertrophy without canal stenosis or foraminal  narrowing.    At L3-4, there is a mild concentric disc bulge and facet hypertrophy with  minimal canal stenosis and minimal bilateral foraminal narrowing.    L4-5 there is severe facet hypertrophy and ligament flavum hypertrophy as well  with a mild disc bulge  causing mild canal stenosis. No significant foraminal  narrowing.    L5-S1, there is concentric bulge with osteophyte formation and a probable small  central disc extrusion with cranial migration. Overall canal stenosis is  minimal. The neural foramen are patent.    Incidental soft tissue imaging is unremarkable    Impression  1.  Increase loss of height at T12 with increased bony retropulsion and moderate  severe canal stenosis. Possible compression on the cord although no abnormal  cord signal is identified.    2.  Other degenerative changes in the lumbar spine as described.    3.  Mild superior endplate compression deformity of L4 with a chronic    MRI brain      EXAM:  MRI BRAIN W WO CONT     INDICATION:    Pt with MRI findings concerning for MS, but no clinical history  to suggest MS, now with new bilateral rotary nystagmus     COMPARISON:  MRI brain 03/27/2016.     CONTRAST: 15 ml Dotarem.     TECHNIQUE:    Multiplanar multisequence acquisition without and with contrast of the brain.     FINDINGS:  Generalized parenchymal volume loss with commensurate dilation of the sulci and  ventricular system. Unchanged periventricular and deep white matter T2/FLAIR  hyperintensities, in a pattern most consistent with chronic microvascular  ischemic disease. No involvement of the callosal septal interface. Small chronic  infarct in the right cerebellum. There is no acute infarct, hemorrhage,  extra-axial fluid collection, or mass effect. There is no cerebellar tonsillar  herniation. Expected arterial flow-voids are present. No evidence of abnormal  enhancement.     The paranasal sinuses, mastoid air cells, and middle ears are clear. The orbital  contents are within normal limits. No significant osseous or scalp lesions are  identified.     IMPRESSION:  IMPRESSION:   1. No evidence of acute intracranial abnormality.  2. Unchanged generalized parenchymal volume loss and chronic microvascular  ischemic disease. No evidence of  demyelinating disease as clinically queried.                  Lab Results   Component Value Date    WBC 9.2 05/02/2022    HGB 10.1 (L) 05/02/2022    HCT 32.6 (L) 05/02/2022    MCV 76.3 (L) 05/02/2022    PLT 288 05/02/2022     Lab Results   Component Value Date    NA 138 05/02/2022    K 4.0 05/02/2022    CL 106 05/02/2022    CO2 25 05/02/2022    BUN 15 05/02/2022    CREATININE 1.23 (H) 05/02/2022  GLUCOSE 123 (H) 05/02/2022    CALCIUM 8.6 05/02/2022    BILITOT 0.2 05/02/2022    ALKPHOS 157 (H) 05/02/2022    AST 22 05/02/2022    ALT 26 05/02/2022    LABGLOM 48 (L) 05/02/2022    GFRAA >60 12/12/2019    AGRATIO 0.7 (L) 06/06/2021    GLOB 3.8 05/02/2022         Imaging:Exam:  Vitals:    01/11/23 1100   BP: 134/86   Pulse: 92   SpO2: 97%       NEUROLOGICAL EXAMINATION:     Mental Status:   Alert and oriented to person, place, and time.  Attention span and concentration are normal. Speech is fluent .      Cranial Nerves:    II, III, IV, VI:  Visual acuity grossly intact. Visual fields are normal.    Pupils are equal, round, and reactive.    Extra-ocular movements are full and fluid.    V-XII: Hearing is grossly intact.  Facial features are symmetric, with normal sensation and strength.  The palate rises symmetrically and the tongue protrudes midline.        Motor Examination: Normal tone, bulk, and strength.     Sensory exam:  Normal throughout     Coordination:  Finger to nose and rapid arm movement testing was normal.   No resting or intention tremor    Gait and Station:  Steady.  Normal arm swing. No muscle wasting or fasiculations noted.        Assessment/Plan   Pt is a 67y.o. right handed female with radiographic changes c/w MS, but no clinical symptoms, stable MRI's since 2011, the last MRI brain 02/17/17.     Additionally, has RLS, with an ill-defined irritating sensation in her legs with urge to move her legs beginning around 6PM and keeping her awake and inhibiting her activities, though question if there may be  some component of psych issues/akasthesia contributing to her need to walk.   Currently doing well on pregabalin 100 mg twice daily and Requip 4mg  daily.   She did not do well on gabapentin and Horizant was quite effective but was not covered    Deferring any additional brain imaging as it is not going to change management.  If she develops any clinical symptoms, she will let me know    Total time 30 minutes     Diagnosis Orders   1. Restless legs syndrome (RLS)        2. Status post lumbar spinal fusion        3. Abnormal brain MRI          Wells Guiles, MD

## 2023-02-07 ENCOUNTER — Ambulatory Visit: Payer: MEDICARE | Attending: Internal Medicine | Primary: Internal Medicine

## 2023-02-07 ENCOUNTER — Encounter

## 2023-02-07 MED ORDER — PREGABALIN 150 MG PO CAPS
150 MG | ORAL_CAPSULE | Freq: Two times a day (BID) | ORAL | 1 refills | Status: AC
Start: 2023-02-07 — End: 2023-08-06

## 2023-02-12 ENCOUNTER — Encounter

## 2023-02-14 MED ORDER — ATORVASTATIN CALCIUM 10 MG PO TABS
10 MG | ORAL_TABLET | ORAL | 1 refills | Status: DC
Start: 2023-02-14 — End: 2023-06-10

## 2023-02-16 ENCOUNTER — Encounter

## 2023-02-16 MED ORDER — BUPROPION HCL ER (XL) 150 MG PO TB24
150 MG | ORAL_TABLET | Freq: Every morning | ORAL | 0 refills | Status: DC
Start: 2023-02-16 — End: 2023-05-16

## 2023-02-16 MED ORDER — DIAZEPAM 5 MG PO TABS
5 | ORAL_TABLET | Freq: Two times a day (BID) | ORAL | 0 refills | Status: AC | PRN
Start: 2023-02-16 — End: 2023-03-18

## 2023-02-25 ENCOUNTER — Inpatient Hospital Stay: Admit: 2023-02-25 | Payer: MEDICARE | Attending: Internal Medicine | Primary: Internal Medicine

## 2023-02-25 ENCOUNTER — Ambulatory Visit: Admit: 2023-02-25 | Payer: MEDICARE | Attending: Internal Medicine | Primary: Internal Medicine

## 2023-02-25 ENCOUNTER — Encounter

## 2023-02-25 DIAGNOSIS — Z0000 Encounter for general adult medical examination without abnormal findings: Secondary | ICD-10-CM

## 2023-02-25 DIAGNOSIS — R0602 Shortness of breath: Secondary | ICD-10-CM

## 2023-02-25 LAB — COMPREHENSIVE METABOLIC PANEL
ALT: 23 U/L (ref 12–78)
AST: 20 U/L (ref 15–37)
Albumin/Globulin Ratio: 1.1 (ref 1.1–2.2)
Albumin: 3.8 g/dL (ref 3.5–5.0)
Alk Phosphatase: 165 U/L — ABNORMAL HIGH (ref 45–117)
Anion Gap: 6 mmol/L (ref 5–15)
BUN/Creatinine Ratio: 23 — ABNORMAL HIGH (ref 12–20)
BUN: 20 mg/dL (ref 6–20)
CO2: 28 mmol/L (ref 21–32)
Calcium: 9.2 mg/dL (ref 8.5–10.1)
Chloride: 107 mmol/L (ref 97–108)
Creatinine: 0.86 mg/dL (ref 0.55–1.02)
Est, Glom Filt Rate: 73 mL/min/{1.73_m2} (ref >60)
Globulin: 3.4 g/dL (ref 2.0–4.0)
Glucose: 102 mg/dL — ABNORMAL HIGH (ref 65–100)
Potassium: 4.5 mmol/L (ref 3.5–5.1)
Sodium: 141 mmol/L (ref 136–145)
Total Bilirubin: 0.3 mg/dL (ref 0.2–1.0)
Total Protein: 7.2 g/dL (ref 6.4–8.2)

## 2023-02-25 LAB — CBC
Hematocrit: 29.5 % — ABNORMAL LOW (ref 35.0–47.0)
Hemoglobin: 8.6 g/dL — ABNORMAL LOW (ref 11.5–16.0)
MCH: 22.1 pg — ABNORMAL LOW (ref 26.0–34.0)
MCHC: 29.2 g/dL — ABNORMAL LOW (ref 30.0–36.5)
MCV: 75.8 FL — ABNORMAL LOW (ref 80.0–99.0)
MPV: 9.7 FL (ref 8.9–12.9)
Nucleated RBCs: 0 /100{WBCs}
Platelets: 299 10*3/uL (ref 150–400)
RBC: 3.89 M/uL (ref 3.80–5.20)
RDW: 17.4 % — ABNORMAL HIGH (ref 11.5–14.5)
WBC: 5.7 10*3/uL (ref 3.6–11.0)
nRBC: 0 10*3/uL (ref 0.00–0.01)

## 2023-02-25 LAB — LIPID PANEL
Chol/HDL Ratio: 3.6 (ref 0.0–5.0)
Cholesterol, Total: 196 mg/dL (ref <200)
HDL: 55 mg/dL
LDL Cholesterol: 90.4 mg/dL (ref 0–100)
Triglycerides: 253 mg/dL — ABNORMAL HIGH (ref <150)
VLDL Cholesterol Calculated: 50.6 mg/dL

## 2023-02-25 LAB — HEMOGLOBIN A1C
Estimated Avg Glucose: 131 mg/dL
Hemoglobin A1C: 6.2 % — ABNORMAL HIGH (ref 4.0–5.6)

## 2023-02-25 LAB — TSH: TSH, 3rd Generation: 1.99 u[IU]/mL (ref 0.36–3.74)

## 2023-02-25 LAB — VITAMIN D 25 HYDROXY: Vit D, 25-Hydroxy: 43.1 ng/mL (ref 30–100)

## 2023-02-25 LAB — D-DIMER, QUANTITATIVE: D-Dimer, Quant: 2.23 mg{FEU}/L — ABNORMAL HIGH (ref 0.00–0.65)

## 2023-02-25 NOTE — Telephone Encounter (Signed)
Patient called to let the Dr know what type of treatment she had     Patient had SPRAVATO (esketamine) - Nasal Spray Treatment - it was for - 6 weeks    Treatments were 3 times a week, then 2 times a week, then 1 time a week, then every other week

## 2023-02-25 NOTE — Progress Notes (Signed)
Lindsay [name] (DOB:  10/31/1955) is a 67 y.o. female,Established patient, here for evaluation of the following chief complaint(s):  Medicare AWV (AWV; patient is fasting )         Assessment & Plan  Medicare annual wellness visit, subsequent     Mammogram ordered Prevnar today colonoscopy done in 2019 Dr. Glenna Fellows does GYN care 3 years ago advised repeat visit with her  Orders:    Hemoglobin A1C; Future    Severe episode of recurrent major depressive disorder, without psychotic features (HCC)  Significant ongoing concern saw a provider near [facility] who gave her esketamine  intranasal believes that it was helpful. tried Trintellix but it was cost prohibitive she stopped Lexapro feeling that it was making her restless legs worse remains on bupropion significant depressive symptoms strongly encouraged associate with psychiatrist in town names provided  Contracted for safety     Orders:    AFL - Joanell Rising, MD, Psychiatry, [place]    Other pulmonary embolism without acute cor pulmonale, unspecified chronicity (HCC)  Distant past , postop, no recent pleuritic pain but bilateral edema chronic cough and some shortness of breath check D-dimer and chest x-ray         Localized edema  Worse recently may be related to increased dose of Lyrica to 150 twice daily check labs  No signs of cellulitis and edema is bilateral so unlikely to be a dvt         Need for prophylactic vaccination against Streptococcus pneumoniae (pneumococcus)       Orders:    Pneumococcal, PCV20, PREVNAR 20, (age 54 yrs+), IM, PF    Shortness of breath  Endorses weight gain and gradual decrease in breathing capacity no chest pain no acute changes evaluate with chest x-ray D-dimer and echo    Orders:    Echo (TTE) complete (PRN contrast/bubble/strain/3D); Future    AMB POC EKG ROUTINE    CBC; Future    XR CHEST (2 VIEWS); Future    D-Dimer, Quantitative; Future    Chronic cough       Orders:    CBC; Future    Dyslipidemia       Orders:    Lipid Panel;  Future    Comprehensive Metabolic Panel; Future    TSH; Future    Vitamin deficiency  Rule out vitamin D deficiency admits to taking pills sporadically 5000 units    Orders:    Vitamin D 25 Hydroxy; Future    Other chronic back pain  Ongoing struggles with her back she has had extensive surgery in the past she has seen neurology most recently bumped Lyrica from 75 twice daily to 150 twice daily I suspect this is contributing to her edema         Breast cancer screening by mammogram       Orders:    MAM TOMO DIGITAL SCREEN BILATERAL; Future      No follow-ups on file.       Subjective   HPI  Since our last visit last fall she was working with a mental health provider [place] who gave her some type of antidepressant intranasally over period of months.  She cannot recall the name of the medicine but later reports it was esketamine .  She had to be monitored in their office for blood pressure effects.  She believes that it helped with her depression.  However now she is endorsing significant depressive symptoms states depressed about her weight depressed about  her looks tearful.  No thoughts of self-harm.  Not on the Trintellix because of cost.  Not on Lexapro because of concerns for restless leg currently using Wellbutrin and Valium with Requip for restless legs.  Does not have a mental health provider currently and discussed extensively how important I think this is names provided.    Edema bilateral gradually increasing has increased dose of pregabalin likely the cause.  Also endorses chronic cough and some shortness of breath which has been gradual and not acute.  No fevers or chills.  No purulent sputum.  No hemoptysis.    Chronic back pain ongoing issue has had extensive surgery to her back has seen neurology currently managing with the Lyrica.    Preventive care colonoscopy done in 2019 mammogram is due and ordered GYN visit is due and encouraged  Review of Systems       Objective   Physical  Exam  Constitutional:       Appearance: Normal appearance.   HENT:      Head: Normocephalic and atraumatic.      Right Ear: Tympanic membrane normal.      Left Ear: Tympanic membrane normal.   Eyes:      Pupils: Pupils are equal, round, and reactive to light.   Cardiovascular:      Rate and Rhythm: Normal rate and regular rhythm.   Pulmonary:      Effort: Pulmonary effort is normal.      Breath sounds: Normal breath sounds.   Abdominal:      General: There is no distension.      Palpations: Abdomen is soft.   Genitourinary:     Comments: Breasts: breasts appear normal, no suspicious masses, no skin or nipple changes or axillary nodes    Musculoskeletal:      Right lower leg: Edema present.      Left lower leg: Edema (1 plus bilateral) present.   Neurological:      General: No focal deficit present.      Mental Status: She is alert and oriented to person, place, and time.   Psychiatric:      Comments: tearful              An electronic signature was used to authenticate this note.    --Golda Acre, MD Medicare Annual Wellness Visit    Lindsay [name] is here for Medicare AWV (AWV; patient is fasting )    Assessment & Plan   Medicare annual wellness visit, subsequent  -     Hemoglobin A1C; Future  Severe episode of recurrent major depressive disorder, without psychotic features [facility])  Assessment & Plan:  Significant ongoing concern saw a provider near [facility] who gave her some kind of medicine intranasally she cannot recall the name believes that it was helpful tried Trintellix but it was cost prohibitive she stopped Lexapro feeling that it was making her restless legs worse remains on bupropion significant depressive symptoms strongly encouraged associate with psychiatrist in town names provided    Orders:    AFL - Joanell Rising, MD, Psychiatry, [place]    Orders:  -     AFL - Joanell Rising, MD, Psychiatry, [place]  Other pulmonary embolism without acute cor pulmonale, unspecified chronicity (HCC)  Localized edema  Need  for prophylactic vaccination against Streptococcus pneumoniae (pneumococcus)  -     Pneumococcal, PCV20, PREVNAR 20, (age 59 yrs+), IM, PF  Shortness of breath  -     Echo (TTE) complete (  PRN contrast/bubble/strain/3D); Future  -     AMB POC EKG ROUTINE  -     CBC; Future  -     XR CHEST (2 VIEWS); Future  -     D-Dimer, Quantitative; Future  Chronic cough  -     CBC; Future  Dyslipidemia  -     Lipid Panel; Future  -     Comprehensive Metabolic Panel; Future  -     TSH; Future  Vitamin deficiency  -     Vitamin D 25 Hydroxy; Future  Other chronic back pain  Assessment & Plan:  Ongoing struggles with her back she has had extensive surgery in the past she has seen neurology most recently bumped Lyrica from 75 twice daily to 150 twice daily I suspect this is contributing to her edema         Breast cancer screening by mammogram  -     MAM TOMO DIGITAL SCREEN BILATERAL; Future    Recommendations for Preventive Services Due: see orders and patient instructions/AVS.  Recommended screening schedule for the next 5-10 years is provided to the patient in written form: see Patient Instructions/AVS.     Return in about 3 weeks (around 03/18/2023).     Subjective       Patient's complete Health Risk Assessment and screening values have been reviewed and are found in Flowsheets. The following problems were reviewed today and where indicated follow up appointments were made and/or referrals ordered.    Positive Risk Factor Screenings with Interventions:    Fall Risk:  Do you feel unsteady or are you worried about falling? : (!) yes  2 or more falls in past year?: (!) yes  Fall with injury in past year?: no       Interventions:    Reviewed medications, home hazards, visual acuity, and co-morbidities that can increase risk for falls            General HRA Questions:  Select all that apply: (!) Anger    Interventions - Anger:  Refer to psychiatry dr Karie Mainland or [facility] numbers provided        Abnormal BMI (obese):  Body  mass index is 32.24 kg/m. (!) Abnormal    Interventions:            Vision Screen:  Do you have difficulty driving, watching TV, or doing any of your daily activities because of your eyesight?: No  Have you had an eye exam within the past year?: (!) No    Interventions:   Patient encouraged to make appointment with their eye specialist                    Objective   Vitals:    02/25/23 0903 02/25/23 0920   BP: 118/77 (!) 124/94   Site: Left Upper Arm    Position: Sitting    Cuff Size: Medium Adult    Pulse: 83    Resp: 16    Temp: 97.7 F (36.5 C)    TempSrc: Oral    SpO2: 94%    Weight: 82.6 kg (182 lb)    Height: 1.6 m (5\' 3" )       Body mass index is 32.24 kg/m.                  No Known Allergies  Prior to Visit Medications    Medication Sig Taking? Authorizing Provider   buPROPion (WELLBUTRIN XL) 150 MG extended  release tablet TAKE 1 TABLET BY MOUTH EVERY MORNING *APPT REQUIRED FOR FURTHER REFILLS* Yes Clarene Critchley, MD   diazePAM (VALIUM) 5 MG tablet Take 1 tablet by mouth every 12 hours as needed for Anxiety or Sleep for up to 30 days. Max Daily Amount: 10 mg Yes Clarene Critchley, MD   atorvastatin (LIPITOR) 10 MG tablet TAKE 1 TABLET BY MOUTH EVERY DAY Yes Clarene Critchley, MD   pregabalin (LYRICA) 150 MG capsule Take 1 capsule by mouth 2 times daily for 180 days. Max Daily Amount: 300 mg Yes Sangha, Lucien Mons, MD   rOPINIRole (REQUIP) 1 MG tablet TAKE 1 TABLET AT 1-2PM AND TAKE 3 TABLETS BY MOUTH 1 TO 3 HOURS BEFORE BEDTIME EVERYDAY Yes Sangha, Lucien Mons, MD   ergocalciferol (ERGOCALCIFEROL) 1.25 MG (50000 UT) capsule  Yes [provider]   acetaminophen (TYLENOL) 325 MG tablet Take by mouth every 4 hours as needed Yes Automatic Reconciliation, Ar   aspirin 81 MG chewable tablet Take by mouth daily Yes Automatic Reconciliation, Ar   vitamin D3 (CHOLECALCIFEROL) 125 MCG (5000 UT) TABS tablet TAKE 1 TABLET BY MOUTH DAILY Yes Automatic Reconciliation, Ar   omeprazole (PRILOSEC OTC) 20 MG  tablet Take by mouth every evening Yes Automatic Reconciliation, Ar       CareTeam (Including outside providers/suppliers regularly involved in providing care):   Patient Care Team:  Clarene Critchley, MD as PCP - General  Darl Pikes Stevphen Rochester, MD as PCP - Empaneled Provider      Reviewed and updated this visit:  Tobacco  Allergies  Meds  Med Hx  Surg Hx  Soc Hx  Fam Hx

## 2023-02-25 NOTE — Patient Instructions (Signed)
Preventing Falls: Care Instructions  Injuries and health problems such as trouble walking or poor eyesight can increase your risk of falling. So can some medicines. But there are things you can do to help prevent falls. You can exercise to get stronger. You can also arrange your home to make it safer.    Talk to your doctor about the medicines you take. Ask if any of them increase the risk of falls and whether they can be changed or stopped.   Try to exercise regularly. It can help improve your strength and balance. This can help lower your risk of falling.         Practice fall safety and prevention.   Wear low-heeled shoes that fit well and give your feet good support. Talk to your doctor if you have foot problems that make this hard.  Carry a cellphone or wear a medical alert device that you can use to call for help.  Use stepladders instead of chairs to reach high objects. Don't climb if you're at risk for falls. Ask for help, if needed.  Wear the correct eyeglasses, if you need them.        Make your home safer.   Remove rugs, cords, clutter, and furniture from walkways.  Keep your house well lit. Use night-lights in hallways and bathrooms.  Install and use sturdy handrails on stairways.  Wear nonskid footwear, even inside. Don't walk barefoot or in socks without shoes.        Be safe outside.   Use handrails, curb cuts, and ramps whenever possible.  Keep your hands free by using a shoulder bag or backpack.  Try to walk in well-lit areas. Watch out for uneven ground, changes in pavement, and debris.  Be careful in the winter. Walk on the grass or gravel when sidewalks are slippery. Use de-icer on steps and walkways. Add non-slip devices to shoes.    Put grab bars and nonskid mats in your shower or tub and near the toilet. Try to use a shower chair or bath bench when bathing.   Get into a tub or shower by putting in your weaker leg first. Get out with your strong side first. Have a phone or medical alert  device in the bathroom with you.   Where can you learn more?  Go to [employer] and enter G117 to learn more about "Preventing Falls: Care Instructions."  Current as of: January 18, 2022  Content Version: 14.1   2006-2024 [employer], Inc[employer].   Care instructions adapted under license by [facility]. If you have questions about a medical condition or this instruction, always ask your healthcare professional. Healthwise, Incorporated disclaims any warranty or liability for your use of this information.           Learning About Managing Anger  What causes anger?  Many things can cause anger. Stress at home or at work can cause anger. Being in stressful social situations can also cause it.  Anger signals your body to prepare for a fight. This reaction is often called "fight or flight." When you get angry, adrenaline and other hormones are released into your blood. Then your blood pressure goes up, your heart beats faster, and you breathe faster.  When you express anger in a healthy way, it can inspire change and make you productive. But if you don't have the skills to express anger in a healthy way, anger can build up. You may hurt others--and yourself--emotionally and even physically. Violent behavior often  starts with verbal threats or fairly minor incidents. But over time, it can involve physical harm. It can include physical, verbal, or sexual abuse of an intimate partner (domestic violence), a child (child abuse), or an older adult (elder abuse).  It can also make you sick. Anger and constant hostility keep your blood pressure high. They raise your chances of having other health problems, such as depression, a heart attack, or a stroke. Some people with post-traumatic stress disorder (PTSD) feel angry and on alert all the time.  It may feel like there are no other ways to react when you are angry. But when you learn healthy ways to work with anger, it no longer controls you.  How can  you manage your anger?  The first step to managing anger is to be more aware of it. Note the thoughts, feelings, and emotions that you have when you get angry. Practice noticing these signs of anger when you are calm. If you are more aware of the signs of anger, you can take steps to manage it. Here are a few tips:  Think before you act. Take time to stop and cool down when you feel yourself getting angry. Count to 10 while you take slow, steady breaths. Practice some other form of mental relaxation.  Learn the feelings that lead to angry outbursts. Anger and hostility may be a symptom of unhappy feelings or depression about your job, your relationship, or other aspects of your personal life.  Try to avoid situations that lead to angry outbursts. If standing in line bothers you, do errands at less busy times.  Express anger in a healthy way. You might:  Go for a short walk or jog.  Draw, paint, or listen to music to help release the anger.  Write in a journal.  Use "I" statements, not "you" statements, to discuss your anger. Say "I don't feel valued when my needs are not being met" instead of "You make me mad when you are so inconsiderate."  Take care of yourself.  Exercise regularly.  Eat a variety of healthy foods. Don't skip meals.  Try to get 8 hours of sleep each night.  Limit your use of alcohol, and avoid using drugs.  Practice yoga, meditation, or tai chi to relax.  Explore other resources that may be available through your job or your community.  Contact your human resources department at work. You might be able to get services through an employee assistance program.  Contact your local hospital, mental health facility, or health department. Ask what types of programs or support groups are available in your area.  Do not keep guns in your home. If you must have guns in your home, unload them and lock them up. Lock ammunition in a separate place. Keep guns away from children.  Where can you find help?  If  anger or stress starts to harm your work or personal relationships, you may want to seek help. You can learn ways to manage your feelings and actions.  Talk to someone you trust, or find a Veterinary surgeon.  Learn about groups in your area that can connect you with people to talk to. The following resources can help.  Behavioral Health Treatment Services Locator. This service from the national Substance Abuse and Mental Health Services Administration can help you find local counselors. Search online at [employer]RockToxic.pl or call 1-800-662-HELP (4357), or TDD 978-463-3632.  National Alliance on Mental Illness (NAMI). You can call the NAMI HelpLine (9126525011) or go  online (CommonFit.co.nz) to find resources and chat with a trained volunteer.  Where can you learn more?  Go to [employer] and enter Z357 to learn more about "Learning About Managing Anger."  Current as of: December 26, 2021  Content Version: 14.1   2006-2024 Healthwise, Incorporated.   Care instructions adapted under license by [facility]. If you have questions about a medical condition or this instruction, always ask your healthcare professional. Healthwise, Incorporated disclaims any warranty or liability for your use of this information.           Learning About Vision Tests  What are vision tests?     The four most common vision tests are visual acuity tests, refraction, visual field tests, and color vision tests.  Visual acuity (sharpness) tests  These tests are used:  To see if you need glasses or contact lenses.  To monitor an eye problem.  To check an eye injury.  Visual acuity tests are done as part of routine exams. You may also have this test when you get your driver's license or apply for some types of jobs.  Visual field tests  These tests are used:  To check for vision loss in any area of your range of vision.  To screen for certain eye diseases.  To look for nerve damage after a stroke, head injury, or other  problem that could reduce blood flow to the brain.  Refraction and color tests  A refraction test is done to find the right prescription for glasses and contact lenses.  A color vision test is done to check for color blindness.  Color vision is often tested as part of a routine exam. You may also have this test when you apply for a job where recognizing different colors is important, such as truck driving, Optician, dispensing, or the [employer].  How are vision tests done?  Visual acuity test   You cover one eye at a time.  You read aloud from a wall chart across the room.  You read aloud from a small card that you hold in your hand.  Refraction   You look into a special device.  The device puts lenses of different strengths in front of each eye to see how strong your glasses or contact lenses need to be.  Visual field tests   Your doctor may have you look through special machines.  Or your doctor may simply have you stare straight ahead while they move a finger into and out of your field of vision.  Color vision test   You look at pieces of printed test patterns in various colors. You say what number or symbol you see.  Your doctor may have you trace the number or symbol using a pointer.  How do these tests feel?  There is very little chance of having a problem from this test. If dilating drops are used for a vision test, they may make the eyes sting and cause a medicine taste in the mouth.  Follow-up care is a key part of your treatment and safety. Be sure to make and go to all appointments, and call your doctor if you are having problems. It's also a good idea to know your test results and keep a list of the medicines you take.  Where can you learn more?  Go to [employer] and enter G551 to learn more about "Learning About Vision Tests."  Current as of: December 07, 2021  Content Version: 14.1   2006-2024 Healthwise, Incorporated.  Care instructions adapted under license by [facility]. If you have  questions about a medical condition or this instruction, always ask your healthcare professional. Healthwise, Incorporated disclaims any warranty or liability for your use of this information.           Starting a Weight Loss Plan: Care Instructions  Overview     It can be a challenge to lose weight. But your doctor can help you make a weight-loss plan that meets your needs.  You don't have to make a lot of big changes at once. A better idea might be to focus on small changes and stick with them. When those changes become habit, you can add a few more changes.  Some people find it helpful to take an exercise or nutrition class. If you have questions, ask your doctor about seeing a registered dietitian or an exercise specialist. You might also think about joining a weight-loss support group.  If you're not ready to make changes right now, try to pick a date in the future. Then make an appointment with your doctor to talk about when and how you'll get started with a plan.  Follow-up care is a key part of your treatment and safety. Be sure to make and go to all appointments, and call your doctor if you are having problems. It's also a good idea to know your test results and keep a list of the medicines you take.  How can you care for yourself at home?  Set realistic goals. Many people expect to lose much more weight than is likely. A weight loss of 5% to 10% of your body weight may be enough to improve your health.  Get family and friends involved to provide support. Talk to them about why you are trying to lose weight, and ask them to help. They can help by participating in exercise and having meals with you, even if they may be eating something different.  Find what works best for you. If you do not have time or do not like to cook, a program that offers meal replacement bars or shakes may be better for you. Or if you like to prepare meals, finding a plan that includes daily menus and recipes may be best.  Ask your  doctor about other health professionals who can help you achieve your weight loss goals.  A dietitian can help you make healthy changes in your diet.  An exercise specialist or personal trainer can help you develop a safe and effective exercise program.  A counselor or psychiatrist can help you cope with issues such as depression, anxiety, or family problems that can make it hard to focus on weight loss.  Consider joining a support group for people who are trying to lose weight. Your doctor can suggest groups in your area.  Where can you learn more?  Go to [employer] and enter U357 to learn more about "Starting a Weight Loss Plan: Care Instructions."  Current as of: March 24, 2022  Content Version: 14.1   2006-2024 Healthwise, Incorporated.   Care instructions adapted under license by [facility]. If you have questions about a medical condition or this instruction, always ask your healthcare professional. Healthwise, Incorporated disclaims any warranty or liability for your use of this information.           A Healthy Heart: Care Instructions  Overview     Coronary artery disease, also called heart disease, occurs when a substance called plaque builds up in the  vessels that supply oxygen-rich blood to your heart muscle. This can narrow the blood vessels and reduce blood flow. A heart attack happens when blood flow is completely blocked. A high-fat diet, smoking, and other factors increase the risk of heart disease.  Your doctor has found that you have a chance of having heart disease. A heart-healthy lifestyle can help keep your heart healthy and prevent heart disease. This lifestyle includes eating healthy, being active, staying at a weight that's healthy for you, and not smoking or using tobacco. It also includes taking medicines as directed, managing other health conditions, and trying to get a healthy amount of sleep.  Follow-up care is a key part of your treatment and safety. Be  sure to make and go to all appointments, and call your doctor if you are having problems. It's also a good idea to know your test results and keep a list of the medicines you take.  How can you care for yourself at home?  Diet   Use less salt when you cook and eat. This helps lower your blood pressure. Taste food before salting. Add only a little salt when you think you need it. With time, your taste buds will adjust to less salt.    Eat fewer snack items, fast foods, canned soups, and other high-salt, high-fat, processed foods.    Read food labels and try to avoid saturated and trans fats. They increase your risk of heart disease by raising cholesterol levels.    Limit the amount of solid fat--butter, margarine, and shortening--you eat. Use olive, peanut, or canola oil when you cook. Bake, broil, and steam foods instead of frying them.    Eat a variety of fruit and vegetables every day. Dark green, deep orange, red, or yellow fruits and vegetables are especially good for you. Examples include spinach, carrots, peaches, and berries.    Foods high in fiber can reduce your cholesterol and provide important vitamins and minerals. High-fiber foods include whole-grain cereals and breads, oatmeal, beans, brown rice, citrus fruits, and apples.    Eat lean proteins. Heart-healthy proteins include seafood, lean meats and poultry, eggs, beans, peas, nuts, seeds, and soy products.    Limit drinks and foods with added sugar. These include candy, desserts, and soda pop.   Heart-healthy lifestyle   If your doctor recommends it, get more exercise. For many people, walking is a good choice. Or you may want to swim, bike, or do other activities. Bit by bit, increase the time you're active every day. Try for at least 30 minutes on most days of the week.    Try to quit or cut back on using tobacco and other nicotine products. This includes smoking and vaping. If you need help quitting, talk to your doctor about stop-smoking  programs and medicines. These can increase your chances of quitting for good. Quitting is one of the most important things you can do to protect your heart. It is never too late to quit. Try to avoid secondhand smoke too.    Stay at a weight that's healthy for you. Talk to your doctor if you need help losing weight.    Try to get 7 to 9 hours of sleep each night.    Limit alcohol to 2 drinks a day for men and 1 drink a day for women. Too much alcohol can cause health problems.    Manage other health problems such as diabetes, high blood pressure, and high cholesterol. If you think you may  have a problem with alcohol or drug use, talk to your doctor.   Medicines   Take your medicines exactly as prescribed. Call your doctor if you think you are having a problem with your medicine.    If your doctor recommends aspirin, take the amount directed each day. Make sure you take aspirin and not another kind of pain reliever, such as acetaminophen (Tylenol).   When should you call for help?   Call 911 if you have symptoms of a heart attack. These may include:   Chest pain or pressure, or a strange feeling in the chest.    Sweating.    Shortness of breath.    Pain, pressure, or a strange feeling in the back, neck, jaw, or upper belly or in one or both shoulders or arms.    Lightheadedness or sudden weakness.    A fast or irregular heartbeat.   After you call 911, the operator may tell you to chew 1 adult-strength or 2 to 4 low-dose aspirin. Wait for an ambulance. Do not try to drive yourself.  Watch closely for changes in your health, and be sure to contact your doctor if you have any problems.  Where can you learn more?  Go to [employer] and enter F075 to learn more about "A Healthy Heart: Care Instructions."  Current as of: December 26, 2021  Content Version: 14.1   2006-2024 Healthwise, Incorporated.   Care instructions adapted under license by [facility]. If you have questions about a  medical condition or this instruction, always ask your healthcare professional. Healthwise, Incorporated disclaims any warranty or liability for your use of this information.      Personalized Preventive Plan for Lindsay [name] - 02/25/2023  Medicare offers a range of preventive health benefits. Some of the tests and screenings are paid in full while other may be subject to a deductible, co-insurance, and/or copay.    Some of these benefits include a comprehensive review of your medical history including lifestyle, illnesses that may run in your family, and various assessments and screenings as appropriate.    After reviewing your medical record and screening and assessments performed today your provider may have ordered immunizations, labs, imaging, and/or referrals for you.  A list of these orders (if applicable) as well as your Preventive Care list are included within your After Visit Summary for your review.    Other Preventive Recommendations:    A preventive eye exam performed by an eye specialist is recommended every 1-2 years to screen for glaucoma; cataracts, macular degeneration, and other eye disorders.  A preventive dental visit is recommended every 6 months.  Try to get at least 150 minutes of exercise per week or 10,000 steps per day on a pedometer .  Order or download the FREE "Exercise & Physical Activity: Your Everyday Guide" from [employer]. Call (414)245-1061 or search [employer] online.  You need 1200-1500 mg of calcium and 1000-2000 IU of vitamin D per day. It is possible to meet your calcium requirement with diet alone, but a vitamin D supplement is usually necessary to meet this goal.  When exposed to the sun, use a sunscreen that protects against both UVA and UVB radiation with an SPF of 30 or greater. Reapply every 2 to 3 hours or after sweating, drying off with a towel, or swimming.  Always wear a seat belt when traveling in a car. Always wear a  helmet when riding a bicycle  or motorcycle.

## 2023-02-25 NOTE — Assessment & Plan Note (Signed)
Ongoing struggles with her back she has had extensive surgery in the past she has seen neurology most recently bumped Lyrica from 75 twice daily to 150 twice daily I suspect this is contributing to her edema

## 2023-02-25 NOTE — Assessment & Plan Note (Addendum)
Significant ongoing concern saw a provider near [facility] who gave her esketamine  intranasal believes that it was helpful. tried Trintellix but it was cost prohibitive she stopped Lexapro feeling that it was making her restless legs worse remains on bupropion significant depressive symptoms strongly encouraged associate with psychiatrist in town names provided  Contracted for safety     Orders:    [facility] - Joanell Rising, MD, Psychiatry, [place]

## 2023-02-26 ENCOUNTER — Telehealth

## 2023-02-26 NOTE — Telephone Encounter (Signed)
I called to discuss her labs  New anemia-needs gi evaluation and more labs to ro vitamin deficiencies  She will call to get first available visit and labs are ordered  Also discussed elevated d dimer  Given sob I am ordering a stat cta to rule out pulmonary embolism

## 2023-02-28 NOTE — Telephone Encounter (Signed)
Returned call to patient & reiterated everything that was discussed by provider over the weekend. Also provided the phone number to the stat scheduling line so she can schedule her stat CTA of the chest. Patient had no further questions or concerns & was thankful for the return call.

## 2023-02-28 NOTE — Telephone Encounter (Signed)
Patient was returning call from this weekend - would like a call back when possible

## 2023-03-01 ENCOUNTER — Inpatient Hospital Stay: Admit: 2023-03-01 | Payer: Medicare Other | Attending: Internal Medicine | Primary: Internal Medicine

## 2023-03-01 ENCOUNTER — Encounter

## 2023-03-01 DIAGNOSIS — R0609 Other forms of dyspnea: Secondary | ICD-10-CM

## 2023-03-01 MED ORDER — IOPAMIDOL 76 % IV SOLN
76 | Freq: Once | INTRAVENOUS | Status: AC | PRN
Start: 2023-03-01 — End: 2023-03-01

## 2023-03-01 MED ADMIN — iopamidol (ISOVUE-370) 76 % injection 100 mL: 100 mL | INTRAVENOUS | @ 20:00:00 | NDC 00270131635

## 2023-03-01 MED FILL — ISOVUE-370 76 % IV SOLN: 76 % | INTRAVENOUS | Qty: 100

## 2023-03-11 NOTE — Telephone Encounter (Signed)
 The patient is requesting a call back to discuss recommendation from the provider in regards to a coloscopy an a physiatrist. She stated that she is having difficulties scheduling an appointment an she will like to be advised. [phone]

## 2023-03-11 NOTE — Telephone Encounter (Signed)
 V/m left for patient notifying this nurse received her call. Informed patient nurse & provider both are gone for the afternoon & will return Monday morning. Discussed if her concerns are urgent she is welcome to speak to another nurse this afternoon or the on-call provider over the weekend.

## 2023-03-13 ENCOUNTER — Inpatient Hospital Stay
Admit: 2023-03-13 | Discharge: 2023-03-14 | Disposition: A | Discharge status: Home / Self Care | Payer: MEDICARE | Attending: Student in an Organized Health Care Education/Training Program

## 2023-03-13 ENCOUNTER — Emergency Department: Admit: 2023-03-13 | Payer: MEDICARE | Primary: Internal Medicine

## 2023-03-13 DIAGNOSIS — K2990 Gastroduodenitis, unspecified, without bleeding: Secondary | ICD-10-CM

## 2023-03-13 LAB — COMPREHENSIVE METABOLIC PANEL
ALT: 24 U/L (ref 12–78)
AST: 17 U/L (ref 15–37)
Albumin/Globulin Ratio: 1 — ABNORMAL LOW (ref 1.1–2.2)
Albumin: 3.9 g/dL (ref 3.5–5.0)
Alk Phosphatase: 180 U/L — ABNORMAL HIGH (ref 45–117)
Anion Gap: 8 mmol/L (ref 2–12)
BUN/Creatinine Ratio: 23 — ABNORMAL HIGH (ref 12–20)
BUN: 19 mg/dL (ref 6–20)
CO2: 25 mmol/L (ref 21–32)
Calcium: 9.7 mg/dL (ref 8.5–10.1)
Chloride: 105 mmol/L (ref 97–108)
Creatinine: 0.82 mg/dL (ref 0.55–1.02)
Est, Glom Filt Rate: 77 mL/min/{1.73_m2} (ref >60)
Globulin: 4.1 g/dL — ABNORMAL HIGH (ref 2.0–4.0)
Glucose: 121 mg/dL — ABNORMAL HIGH (ref 65–100)
Potassium: 3.7 mmol/L (ref 3.5–5.1)
Sodium: 138 mmol/L (ref 136–145)
Total Bilirubin: 0.3 mg/dL (ref 0.2–1.0)
Total Protein: 8 g/dL (ref 6.4–8.2)

## 2023-03-13 LAB — CBC WITH AUTO DIFFERENTIAL
Basophils %: 1 % (ref 0–1)
Basophils Absolute: 0.1 10*3/uL (ref 0.0–0.1)
Eosinophils %: 3 % (ref 0–7)
Eosinophils Absolute: 0.3 10*3/uL (ref 0.0–0.4)
Hematocrit: 31.4 % — ABNORMAL LOW (ref 35.0–47.0)
Hemoglobin: 9.4 g/dL — ABNORMAL LOW (ref 11.5–16.0)
Immature Granulocytes %: 0 % (ref 0.0–0.5)
Immature Granulocytes Absolute: 0 10*3/uL (ref 0.00–0.04)
Lymphocytes %: 32 % (ref 12–49)
Lymphocytes Absolute: 2.9 10*3/uL (ref 0.8–3.5)
MCH: 22 pg — ABNORMAL LOW (ref 26.0–34.0)
MCHC: 29.9 g/dL — ABNORMAL LOW (ref 30.0–36.5)
MCV: 73.4 FL — ABNORMAL LOW (ref 80.0–99.0)
MPV: 9.1 FL (ref 8.9–12.9)
Monocytes %: 7 % (ref 5–13)
Monocytes Absolute: 0.6 10*3/uL (ref 0.0–1.0)
Neutrophils %: 57 % (ref 32–75)
Neutrophils Absolute: 5.1 10*3/uL (ref 1.8–8.0)
Nucleated RBCs: 0 /100{WBCs}
Platelets: 302 10*3/uL (ref 150–400)
RBC: 4.28 M/uL (ref 3.80–5.20)
RDW: 17 % — ABNORMAL HIGH (ref 11.5–14.5)
WBC: 8.9 10*3/uL (ref 3.6–11.0)
nRBC: 0 10*3/uL (ref 0.00–0.01)

## 2023-03-13 LAB — LIPASE: Lipase: 65 U/L (ref 13–75)

## 2023-03-13 MED ORDER — IOPAMIDOL 76 % IV SOLN
76 | Freq: Once | INTRAVENOUS | Status: AC | PRN
Start: 2023-03-13 — End: 2023-03-13

## 2023-03-13 MED ORDER — SODIUM CHLORIDE (PF) 0.9 % IJ SOLN
0.9 | Freq: Once | INTRAMUSCULAR | Status: AC
Start: 2023-03-13 — End: 2023-03-13

## 2023-03-13 MED ADMIN — pantoprazole (PROTONIX) 40 mg in sodium chloride (PF) 0.9 % 10 mL injection: 40 mg | INTRAVENOUS | @ 23:00:00 | NDC 65219043301

## 2023-03-13 MED ADMIN — iopamidol (ISOVUE-370) 76 % injection 100 mL: 100 mL | INTRAVENOUS | NDC 00270131635

## 2023-03-13 MED FILL — ISOVUE-370 76 % IV SOLN: 76 % | INTRAVENOUS | Qty: 100

## 2023-03-13 MED FILL — PANTOPRAZOLE SODIUM 40 MG IV SOLR: 40 MG | INTRAVENOUS | Qty: 40

## 2023-03-13 NOTE — ED Provider Notes (Signed)
 [facility]  EMERGENCY DEPARTMENT ENCOUNTER       Pt Name: Lindsay [name]  MRN: [id number]  Birthdate May 03, 1956  Date of evaluation: 03/13/2023  Provider: Albertine Glatter, DO   PCP: Rowland Dorien CROME, MD  Note Started: 12:26 PM 03/15/23     CHIEF COMPLAINT       Chief Complaint   Patient presents with   . Abdominal Pain     Pt presents ambulatory to ED via triage for c/o diffuse abdominal burning twice in the last 2 months lasting 30 minutes but worse today. Pt mentions that she saw her PCP x3 months ago and was told she is severely anemic. Pt denies any N/V/D.         HISTORY OF PRESENT ILLNESS: 1 or more elements      History From: Patient  None     Lindsay [name] is a 67 y.o. female who presents with cc of intermittent epigastric burning abdominal pain. Has occurred twice in the last 2 months, lasted 30 minutes or so at a time but worse today. Saw PCP and was told she had anemia. Is supposed to follow up with GI but could not get an appointment until February. Denies any fever, nausea, vomiting, diarrhea.      Nursing Notes were all reviewed and agreed with or any disagreements were addressed in the HPI.       PAST HISTORY     Past Medical History:  Past Medical History:   Diagnosis Date   . Abnormal brain scan    . Anxiety disorder    . Arthritis    . Chronic pain    . Depression    . DVT (deep venous thrombosis) (HCC)    . Falls frequently    . Fracture of left ankle    . Headache(784.0) 2011   . Ill-defined condition 09/15/2015    FX R HAND   . Neurocutaneous syndrome (HCC)    . Obesity Mar. '23 - Aug. '24   . Other and unspecified hyperlipidemia 12/27/2012   . PE (pulmonary embolism) 12/2012   . RLS (restless legs syndrome) 03/16/2016    Dr Ronal Cornish   . TIA (transient ischemic attack) 03/2016    Right facial droop, numbness in lip   . Torn rotator cuff     right         Past Surgical History:  Past Surgical History:   Procedure Laterality Date   . APPENDECTOMY     . BREAST SURGERY  2002     AUGMENTATION   . CESAREAN SECTION      x4   . GI      COLONOSCOPY   . IMPLANT BREAST SILICONE/EQ  2002    Bilateral breast implants   . IR KYPHOPLASTY THORACIC FIRST LEVEL  12/12/2019    IR KYPHOPLASTY THORACIC FIRST LEVEL 12/12/2019 [facility]   . IR KYPHOPLASTY THORACIC FIRST LEVEL  12/12/2019   . JOINT REPLACEMENT  2016 knees   . LUMBAR FUSION  06/02/2021   . ORTHOPEDIC SURGERY  08/2004    rotater cuff r. shoulder   . ORTHOPEDIC SURGERY      left ankle fracture treated with air cast   . ORTHOPEDIC SURGERY  12/2011    left shoulder fxt and dislocation   . OTHER SURGICAL HISTORY  2010    left foot torn tendon   . TOTAL KNEE ARTHROPLASTY Bilateral 10/29/2015   . TUBAL LIGATION  Family History:  Family History   Problem Relation Age of Onset   . No Known Problems Son    . No Known Problems Daughter    . No Known Problems Daughter    . Heart Disease Mother    . Other Brother         Covid   . Rheum Arthritis Sister    . Cancer Father         thyroid cancer   . Heart Disease Father    . No Known Problems Daughter    . Anesth Problems Neg Hx        Social History:  Social History     Tobacco Use   . Smoking status: Never     Passive exposure: Past   . Smokeless tobacco: Never   Vaping Use   . Vaping status: Never Used   . Passive vaping exposure: Yes   Substance Use Topics   . Alcohol use: No   . Drug use: No       Allergies:  No Known Allergies    CURRENT MEDICATIONS      Discharge Medication List as of 03/13/2023  9:27 PM        CONTINUE these medications which have NOT CHANGED    Details   buPROPion  (WELLBUTRIN  XL) 150 MG extended release tablet TAKE 1 TABLET BY MOUTH EVERY MORNING *APPT REQUIRED FOR FURTHER REFILLS*, Disp-90 tablet, R-0Normal      diazePAM  (VALIUM ) 5 MG tablet Take 1 tablet by mouth every 12 hours as needed for Anxiety or Sleep for up to 30 days. Max Daily Amount: 10 mg, Disp-60 tablet, R-0Normal      atorvastatin  (LIPITOR) 10 MG tablet TAKE 1 TABLET BY MOUTH EVERY DAY, Disp-90 tablet,  R-1Normal      pregabalin  (LYRICA ) 150 MG capsule Take 1 capsule by mouth 2 times daily for 180 days. Max Daily Amount: 300 mg, Disp-90 capsule, R-1Normal      rOPINIRole  (REQUIP ) 1 MG tablet TAKE 1 TABLET AT 1-2PM AND TAKE 3 TABLETS BY MOUTH 1 TO 3 HOURS BEFORE BEDTIME EVERYDAY, Disp-360 tablet, R-2Normal      ergocalciferol (ERGOCALCIFEROL) 1.25 MG (50000 UT) capsule Historical Med      acetaminophen  (TYLENOL ) 325 MG tablet Take by mouth every 4 hours as neededHistorical Med      aspirin  81 MG chewable tablet Take by mouth dailyHistorical Med      vitamin D3 (CHOLECALCIFEROL) 125 MCG (5000 UT) TABS tablet TAKE 1 TABLET BY MOUTH DAILYHistorical Med      omeprazole  (PRILOSEC OTC) 20 MG tablet Take by mouth every eveningHistorical Med               PHYSICAL EXAM      ED Triage Vitals [03/13/23 1826]   Encounter Vitals Group      BP (!) 147/87      Systolic BP Percentile       Diastolic BP Percentile       Pulse 84      Respirations 18      Temp 98.8 F (37.1 C)      Temp Source Oral      SpO2 95 %      Weight - Scale 82.8 kg (182 lb 8.7 oz)      Height 1.613 m (5' 3.5)      Head Circumference       Peak Flow       Pain Score  Pain Loc       Pain Education       Exclude from Growth Chart               Physical Exam  Vitals and nursing note reviewed.   Constitutional:       Appearance: Normal appearance.   HENT:      Head: Normocephalic and atraumatic.      Mouth/Throat:      Mouth: Mucous membranes are moist.   Cardiovascular:      Rate and Rhythm: Normal rate.   Pulmonary:      Effort: Pulmonary effort is normal. No respiratory distress.      Breath sounds: Normal breath sounds.   Abdominal:      General: Abdomen is flat.      Palpations: Abdomen is soft.      Tenderness: There is abdominal tenderness in the epigastric area.   Musculoskeletal:         General: Normal range of motion.   Skin:     General: Skin is warm.   Neurological:      Mental Status: She is alert. Mental status is at baseline.             DIAGNOSTIC RESULTS   LABS:     Recent Results (from the past 48 hour(s))   CBC with Auto Differential    Collection Time: 03/13/23  6:45 PM   Result Value Ref Range    WBC 8.9 3.6 - 11.0 K/uL    RBC 4.28 3.80 - 5.20 M/uL    Hemoglobin 9.4 (L) 11.5 - 16.0 g/dL    Hematocrit 68.5 (L) 35.0 - 47.0 %    MCV 73.4 (L) 80.0 - 99.0 FL    MCH 22.0 (L) 26.0 - 34.0 PG    MCHC 29.9 (L) 30.0 - 36.5 g/dL    RDW 82.9 (H) 88.4 - 14.5 %    Platelets 302 150 - 400 K/uL    MPV 9.1 8.9 - 12.9 FL    Nucleated RBCs 0.0 0 PER 100 WBC    nRBC 0.00 0.00 - 0.01 K/uL    Neutrophils % 57 32 - 75 %    Lymphocytes % 32 12 - 49 %    Monocytes % 7 5 - 13 %    Eosinophils % 3 0 - 7 %    Basophils % 1 0 - 1 %    Immature Granulocytes % 0 0.0 - 0.5 %    Neutrophils Absolute 5.1 1.8 - 8.0 K/UL    Lymphocytes Absolute 2.9 0.8 - 3.5 K/UL    Monocytes Absolute 0.6 0.0 - 1.0 K/UL    Eosinophils Absolute 0.3 0.0 - 0.4 K/UL    Basophils Absolute 0.1 0.0 - 0.1 K/UL    Immature Granulocytes Absolute 0.0 0.00 - 0.04 K/UL    Differential Type AUTOMATED     Comprehensive Metabolic Panel    Collection Time: 03/13/23  6:45 PM   Result Value Ref Range    Sodium 138 136 - 145 mmol/L    Potassium 3.7 3.5 - 5.1 mmol/L    Chloride 105 97 - 108 mmol/L    CO2 25 21 - 32 mmol/L    Anion Gap 8 2 - 12 mmol/L    Glucose 121 (H) 65 - 100 mg/dL    BUN 19 6 - 20 MG/DL    Creatinine 9.17 9.44 - 1.02 MG/DL    BUN/Creatinine Ratio 23 (H) 12 - 20  Est, Glom Filt Rate 77 >60 ml/min/1.50m2    Calcium  9.7 8.5 - 10.1 MG/DL    Total Bilirubin 0.3 0.2 - 1.0 MG/DL    ALT 24 12 - 78 U/L    AST 17 15 - 37 U/L    Alk Phosphatase 180 (H) 45 - 117 U/L    Total Protein 8.0 6.4 - 8.2 g/dL    Albumin 3.9 3.5 - 5.0 g/dL    Globulin 4.1 (H) 2.0 - 4.0 g/dL    Albumin/Globulin Ratio 1.0 (L) 1.1 - 2.2     Lipase    Collection Time: 03/13/23  6:45 PM   Result Value Ref Range    Lipase 65 13 - 75 U/L   EKG 12 Lead (SOB)    Collection Time: 03/13/23  7:14 PM   Result Value Ref Range    Ventricular  Rate 80 BPM    Atrial Rate 80 BPM    P-R Interval 134 ms    QRS Duration 78 ms    Q-T Interval 388 ms    QTc Calculation (Bazett) 447 ms    P Axis 26 degrees    R Axis 16 degrees    T Axis 47 degrees    Diagnosis       ** Poor data quality, interpretation may be adversely affected  Normal sinus rhythm  When compared with ECG of 02-May-2022 22:30,  No significant change was found  Confirmed by Rohatgi, Sameer [phone]) on 03/14/2023 11:41:25 AM         RADIOLOGY:  Non-plain film images such as CT, Ultrasound and MRI are read by the radiologist. Plain radiographic images are visualized and preliminarily interpreted by the ED Provider with the below findings:        Interpretation per the Radiologist below, if available at the time of this note:     CT ABDOMEN PELVIS W IV CONTRAST Additional Contrast? None   Final Result   No acute intra-abdominal pathology. Stable findings as above      Electronically signed by Harden Cahill              PROCEDURES   Unless otherwise noted below, none  Procedures       CRITICAL CARE TIME       SCREENINGS   NIH Stroke Score     Heart Score     Curb-65        EMERGENCY DEPARTMENT COURSE, COUNSELING and DIFFERENTIAL DIAGNOSIS/MDM   Vitals:    Vitals:    03/13/23 1915 03/13/23 1930 03/13/23 1945 03/13/23 2130   BP: (!) 145/80 136/85 121/86 132/74   Pulse:       Resp:    18   Temp:       TempSrc:       SpO2: 96% 93% 91% 90%   Weight:       Height:                Patient was given the following medications:  Medications   pantoprazole  (PROTONIX ) 40 mg in sodium chloride  (PF) 0.9 % 10 mL injection (40 mg IntraVENous Given 03/13/23 1910)   iopamidol  (ISOVUE -370) 76 % injection 100 mL (100 mLs IntraVENous Given 03/13/23 1958)   mylanta/viscous lidocaine  (GI COCKTAIL) (40 mLs Oral Given 03/13/23 2016)       CONSULTS: (Who and What was discussed)  None      Chronic Conditions: htn    Social Determinants affecting Dx or Tx: None  Records Reviewed (source and summary of external notes): Nursing Notes  and Old Medical Records    CC/HPI Summary, DDx, ED Course, and Reassessment: Patient presenting with subacute epigastric pain, worsening today.  Differential diagnosis includes PUD, gastritis, GERD, pancreatic lesion, UGI bleed. Abdominal exam without peritoneal signs. No evidence of acute abdomen at this time. Well appearing. Low suspicion for acute hepatobiliary disease (includng acute cholecystitis), acute pancreatitis, PUD (including perforation), acute infectious processes (pneumonia, hepatitis, pyelonephritis), atypical appendicitis, vascular catastrophe, bowel obstruction or viscus perforation. Had labs done at PCP with hgb 8.6, also had CTA due to dyspnea whic hwas negative. She has been borderline anemic before when she had surgery in 2022, improved to 10 as an outpatient and now 8.6 at last check.    CBC here with hemoglobin of 9.4, lipase negative, CMP negative, CT abd with no acute process. Her hemoglobin is uptrending from previous value as an outpaitent. Hiatal hernia could be contributing to symptoms. Had good relief with GI cockatail and pepcid  in the ER. Rx'd pepcid , increased PPI and sucralfate . Messaged GI for follow for endoscopy/colonoscopy as an outpatient because at this point patient not meeting criteria for immediate GI intervention. Also recommended to discuss with PCP the delay in obtaining GI appointment.    Understanding was insured that at this time there is no evidence for a more malignant underlying process, but that early in the process of an illness, an emergency department workup can be falsely reassuring.      Routine discharge counseling was given including the fact that any worsening, changing or persistent symptoms should prompt an immediate call or follow up with their primary physician or the emergency department. The importance of appropriate follow up was also discussed. More extensive discharge instructions were given in the patient's discharge paperwork.     After  completion of evaluation and discussion of results and diagnoses, all the questions were answered. If required, all follow up appointments and treatments were discussed and explained. Understanding was insured prior to discharge.    Clinical Management Tools:      ED Course as of 03/15/23 1231   Sun Mar 13, 2023   1903 Hgb uptrending to 9.4 from 8.6 [NM]   2032 EKG interpreted by me. Shows sinus  with a HR of 80. No ST elevations or depressions concerning for ischemia. Normal intervals.    [NM]      ED Course User Index  [NM] Merilee, Matty Deamer L, DO         Disposition Considerations (Tests not done, Shared Decision Making, Pt Expectation of Test or Tx.):      FINAL IMPRESSION     1. Gastritis and duodenitis          DISPOSITION/PLAN   DISPOSITION Decision To Discharge 03/13/2023 09:14:03 PM  Condition at Disposition: Stable      Discharge Note: The patient is stable for discharge home. The signs, symptoms, diagnosis, and discharge instructions have been discussed, understanding conveyed, and agreed upon. The patient is to follow up as recommended or return to ER should their symptoms worsen.      PATIENT REFERRED TO:  [facility]  [address]  [place]  [address]  [phone]    If symptoms worsen    Glynn, Francesca L, MD  [address] [facility]  [address]  [place] [address]  [phone]    Schedule an appointment as soon as possible for a visit in 3 days      Elaine,  Roselie, MD  [address]  Medical Office [address], [address]  [place] [address]  [phone]    Schedule an appointment as soon as possible for a visit       Sharmaine Clotilda DEL, [place]  [address]  [place] [address]  [phone]    Schedule an appointment as soon as possible for a visit            DISCHARGE MEDICATIONS:     Medication List        START taking these medications      famotidine  20 MG tablet  Commonly known as: Pepcid   Take 1 tablet by mouth 2 times daily     omeprazole   40 MG delayed release capsule  Commonly known as: PRILOSEC  Take 1 capsule by mouth every morning (before breakfast)     sucralfate  1 GM tablet  Commonly known as: Carafate   Take 1 tablet by mouth 4 times daily            ASK your doctor about these medications      acetaminophen  325 MG tablet  Commonly known as: TYLENOL      aspirin  81 MG chewable tablet     atorvastatin  10 MG tablet  Commonly known as: LIPITOR  TAKE 1 TABLET BY MOUTH EVERY DAY     buPROPion  150 MG extended release tablet  Commonly known as: WELLBUTRIN  XL  TAKE 1 TABLET BY MOUTH EVERY MORNING *APPT REQUIRED FOR FURTHER REFILLS*     diazePAM  5 MG tablet  Commonly known as: Valium   Take 1 tablet by mouth every 12 hours as needed for Anxiety or Sleep for up to 30 days. Max Daily Amount: 10 mg     ergocalciferol 1.25 MG (50000 UT) capsule  Commonly known as: ERGOCALCIFEROL     omeprazole  20 MG tablet  Commonly known as: PRILOSEC OTC     pregabalin  150 MG capsule  Commonly known as: LYRICA   Take 1 capsule by mouth 2 times daily for 180 days. Max Daily Amount: 300 mg     rOPINIRole  1 MG tablet  Commonly known as: REQUIP   TAKE 1 TABLET AT 1-2PM AND TAKE 3 TABLETS BY MOUTH 1 TO 3 HOURS BEFORE BEDTIME EVERYDAY     vitamin D3 125 MCG (5000 UT) Tabs tablet  Commonly known as: CHOLECALCIFEROL               Where to Get Your Medications        These medications were sent to [id number] - [place], [place] - [address] - P [phone] [place] [place] [phone]  [address], [place] [address]      Hours: 24-hours Phone: [phone]   famotidine  20 MG tablet  omeprazole  40 MG delayed release capsule  sucralfate  1 GM tablet           DISCONTINUED MEDICATIONS:  Discharge Medication List as of 03/13/2023  9:27 PM          I am the Primary Clinician of Record.   Albertine Glatter, DO (electronically signed)      (Please note that parts of this dictation were completed with voice recognition software. Quite often unanticipated  grammatical, syntax, homophones, and other interpretive errors are inadvertently transcribed by the computer software. Please disregards these errors. Please excuse any errors that have escaped final proofreading.)          Glatter, Sanaz Scarlett L, DO  03/15/23 1231

## 2023-03-13 NOTE — Discharge Instructions (Addendum)
 If you experience any new or concerning symptoms or your symptoms change or worsen, return to the ER as soon as possible.

## 2023-03-14 LAB — EKG 12-LEAD
Atrial Rate: 80 {beats}/min
P Axis: 26 degrees
P-R Interval: 134 ms
Q-T Interval: 388 ms
QRS Duration: 78 ms
QTc Calculation (Bazett): 447 ms
R Axis: 16 degrees
T Axis: 47 degrees
Ventricular Rate: 80 {beats}/min

## 2023-03-14 MED ORDER — OMEPRAZOLE 40 MG PO CPDR
40 | ORAL_CAPSULE | Freq: Every day | ORAL | 0 refills | Status: DC
Start: 2023-03-14 — End: 2023-05-18

## 2023-03-14 MED ORDER — SUCRALFATE 1 G PO TABS
1 | ORAL_TABLET | Freq: Four times a day (QID) | ORAL | 0 refills | Status: DC
Start: 2023-03-14 — End: 2023-05-18

## 2023-03-14 MED ORDER — FAMOTIDINE 20 MG PO TABS
20 | Freq: Two times a day (BID) | ORAL | Status: DC
Start: 2023-03-14 — End: 2023-03-13

## 2023-03-14 MED ORDER — LIDOCAINE VISCOUS HCL 2 % MT SOLN
2 | Freq: Once | OROMUCOSAL | Status: AC
Start: 2023-03-14 — End: 2023-03-13

## 2023-03-14 MED ORDER — FAMOTIDINE 20 MG PO TABS
20 | ORAL_TABLET | Freq: Two times a day (BID) | ORAL | 0 refills | Status: DC
Start: 2023-03-14 — End: 2023-05-18

## 2023-03-14 MED ADMIN — famotidine (PEPCID) tablet 20 mg: 20 mg | ORAL | @ 02:00:00 | NDC 00904719361

## 2023-03-14 MED ADMIN — mylanta/viscous lidocaine (GI COCKTAIL): 40 mL | ORAL | NDC 00904732562

## 2023-03-14 MED FILL — FAMOTIDINE 20 MG PO TABS: 20 MG | ORAL | Qty: 1

## 2023-03-14 MED FILL — MAG-AL PLUS 200-200-20 MG/5ML PO LIQD: 200-200-20 MG/5ML | ORAL | Qty: 30

## 2023-03-15 ENCOUNTER — Encounter

## 2023-03-15 NOTE — Telephone Encounter (Signed)
 Returned call to patient. She states she went to the ER for severe burning in her stomach & the ER physician wanted to admit the patient due to have an endoscopy done due to some of the lab values being low. Per the hospital the gi physicians were backed up for 3-4 days with procedures so patient was discharged & advised to follow up on an outpatient basis. She is seeing gi on Oct 1st. She could not remember the NP's name. Advised she discuss having a colonoscopy as well when she sees gi. Patient voiced understanding.

## 2023-03-15 NOTE — Telephone Encounter (Signed)
 The patient is requesting a call back to discuss recommendation from the provider in regards to a coloscopy an a physiatrist. She stated that she is having difficulties scheduling an appointment an she will like to be advised. [phone]

## 2023-03-16 MED ORDER — DIAZEPAM 5 MG PO TABS
5 | ORAL_TABLET | Freq: Two times a day (BID) | ORAL | 0 refills | Status: DC | PRN
Start: 2023-03-16 — End: 2023-04-04

## 2023-03-16 NOTE — Telephone Encounter (Signed)
 Refill request from yesterday is pending in provider's box. This is a duplicate message.

## 2023-03-16 NOTE — Telephone Encounter (Signed)
 Refill request diazePAM (VALIUM) 5 MG tablet       [id number] - [place], [place] - [address] - P [phone] - F [phone]

## 2023-03-17 ENCOUNTER — Inpatient Hospital Stay: Admit: 2023-03-17 | Payer: Medicare Other | Attending: Internal Medicine | Primary: Internal Medicine

## 2023-03-17 VITALS — Ht 63.5 in | Wt 182.0 lb

## 2023-03-17 DIAGNOSIS — R0602 Shortness of breath: Secondary | ICD-10-CM

## 2023-03-17 LAB — ECHO (TTE) COMPLETE (PRN CONTRAST/BUBBLE/STRAIN/3D)
AV Area by Peak Velocity: 2.9 cm2
AV Peak Gradient: 5 mmHg
AV Peak Velocity: 1.1 m/s
AV Velocity Ratio: 0.91
AVA/BSA Peak Velocity: 1.6 cm2/m2
Ao Root Index: 2.14 cm/m2
Aortic Root: 4 cm
Body Surface Area: 1.92 m2
E/E' Lateral: 9.86
E/E' Ratio (Averaged): 9.86
E/E' Septal: 9.86
EF Physician: 60 %
Fractional Shortening 2D: 35 % (ref 28–44)
IVSd: 1.1 cm — AB (ref 0.6–0.9)
LA Diameter: 3.4 cm
LA Size Index: 1.82 cm/m2
LA Volume A-L A4C: 35 mL (ref 22–52)
LA Volume A-L A4C: 40 mL (ref 22–52)
LA Volume A/L: 38 mL
LA Volume Index A-L A2C: 19 mL/m2 (ref 16–34)
LA Volume Index A-L A4C: 21 mL/m2 (ref 16–34)
LA Volume Index A/L: 20 mL/m2 (ref 16–34)
LA Volume Index MOD A2C: 19 mL/m2 (ref 16–34)
LA Volume Index MOD A4C: 21 mL/m2 (ref 16–34)
LA Volume MOD A2C: 35 mL (ref 22–52)
LA Volume MOD A4C: 39 mL (ref 22–52)
LA/AO Root Ratio: 0.85
LV E' Lateral Velocity: 7 cm/s
LV E' Septal Velocity: 7 cm/s
LV EDV A4C: 129 mL
LV EDV Index A4C: 69 mL/m2
LV ESV A4C: 52 mL
LV ESV Index A4C: 28 mL/m2
LV Ejection Fraction A4C: 60 %
LV Mass 2D Index: 69.2 g/m2 (ref 43–95)
LV Mass 2D: 129.3 g (ref 67–162)
LV RWT Ratio: 0.59
LVIDd Index: 1.98 cm/m2
LVIDd: 3.7 cm — AB (ref 3.9–5.3)
LVIDs Index: 1.28 cm/m2
LVIDs: 2.4 cm
LVOT Area: 3.1 cm2
LVOT Diameter: 2 cm
LVOT Peak Gradient: 4 mmHg
LVOT Peak Velocity: 1 m/s
LVPWd: 1.1 cm — AB (ref 0.6–0.9)
MV A Velocity: 0.88 m/s
MV E Velocity: 0.69 m/s
MV E Wave Deceleration Time: 257.6 ms
MV E/A: 0.78
TAPSE: 1.4 cm — AB (ref 1.7–?)
TR Max Velocity: 2.41 m/s
TR Peak Gradient: 23 mmHg

## 2023-03-22 ENCOUNTER — Ambulatory Visit: Payer: MEDICARE | Attending: Internal Medicine | Primary: Internal Medicine

## 2023-03-29 ENCOUNTER — Encounter

## 2023-03-29 ENCOUNTER — Ambulatory Visit: Admit: 2023-03-29 | Payer: Medicare Other | Attending: Internal Medicine | Primary: Internal Medicine

## 2023-03-29 VITALS — BP 116/74 | HR 84 | Temp 98.10000°F | Resp 16 | Ht 63.5 in | Wt 185.0 lb

## 2023-03-29 DIAGNOSIS — D509 Iron deficiency anemia, unspecified: Secondary | ICD-10-CM

## 2023-03-29 LAB — EXTRA TUBES HOLD: Specimen HOld: 2

## 2023-03-29 NOTE — Progress Notes (Signed)
 Lindsay [name] (DOB:  1955-11-05) is a 67 y.o. female,Established patient, here for evaluation of the following chief complaint(s):  Follow-up (3 week follow up)         Assessment & Plan  Microcytic anemia  Seen in ER September 8 with burning abdominal pain felt to have gastritis.  CT abdomen negative.  Hemoglobin 9.4.  Started on Carafate  famotidine  omeprazole .  Has GI appointment October 1.  Will check iron studies and B12    Orders:  .  Ferritin; Future  .  Iron and TIBC; Future  .  Haptoglobin; Future  .  Vitamin B12 & Folate; Future    Acute epigastric pain  Currently being treated for presumed gastritis.  Reports abdominal pain has improved.  Advised stop beer at night and continue medicine started in the ER         Other mixed anxiety disorders  Ongoing significant issues she has found it hard to get an appointment with a mental health provider more names provided.         Breast discharge  Was trying to schedule regular mammogram mention that she had seen back breast discharge and they are recommending diagnostic mammogram which I will order on exam today no evidence of discharge seen    Orders:  .  MAM TOMO DIGITAL DIAGNOSTIC BILATERAL; Future  .  Prolactin; Future      No follow-ups on file.       Subjective   HPI  Follow-up concerning microcytic anemia discovered in August as part of evaluation for dyspnea.  At CTA showed no evidence of pulmonary embolism.  Hemoglobin was 8.4.  She denies gross bleeding or melena.  Has however had an episode of bad epigastric pain went to the emergency room at the beginning of the month.  CT of abdomen was normal.  Hemoglobin was 9.4 microcytic.  They started her on omeprazole  40 famotidine  20 mg twice daily and Carafate  1 g 4 times daily.  Since that time she reports her abdomen has felt better although still somewhat distended.  Denies any NSAID use.  Does take aspirin  and does drink 1 beer a night.  Advised to stop this.  Does have a visit with GI at the beginning  of October.    Notes of very occasional discharge from both breasts.  Nonbloody.  No masses felt.  Not able to replicate in exam today.  Will schedule diagnostic mammogram  Review of Systems       Objective   Physical Exam  Constitutional:       Appearance: Normal appearance.   HENT:      Head: Normocephalic and atraumatic.   Cardiovascular:      Rate and Rhythm: Normal rate and regular rhythm.   Pulmonary:      Effort: Pulmonary effort is normal.      Breath sounds: Normal breath sounds.   Abdominal:      General: Bowel sounds are normal.      Palpations: Abdomen is soft.      Tenderness: There is no abdominal tenderness. There is no guarding or rebound.   Genitourinary:     Comments: Breasts: breasts appear normal, no suspicious masses, no skin or nipple changes or axillary nodes    Musculoskeletal:      Right lower leg: No edema.      Left lower leg: No edema.   Neurological:      General: No focal deficit present.      Mental  Status: She is alert and oriented to person, place, and time.   Psychiatric:         Behavior: Behavior normal.              An electronic signature was used to authenticate this note.    --Sherrel Shafer, MD

## 2023-03-30 LAB — VITAMIN B12 & FOLATE
Folate: 3.7 ng/mL — ABNORMAL LOW (ref 5.0–21.0)
Vitamin B-12: 313 pg/mL (ref 193–986)

## 2023-03-30 LAB — IRON AND TIBC
Iron % Saturation: 4 % — ABNORMAL LOW (ref 20–50)
Iron: 20 ug/dL — ABNORMAL LOW (ref 35–150)
TIBC: 551 ug/dL — ABNORMAL HIGH (ref 250–450)

## 2023-03-30 LAB — HAPTOGLOBIN: Haptoglobin: 289 mg/dL — ABNORMAL HIGH (ref 30–200)

## 2023-03-30 LAB — PROLACTIN: Prolactin: 5.1 ng/mL

## 2023-03-30 LAB — FERRITIN: Ferritin: 6 ng/mL — ABNORMAL LOW (ref 26–388)

## 2023-04-04 ENCOUNTER — Encounter

## 2023-04-04 MED ORDER — DIAZEPAM 5 MG PO TABS
5 MG | ORAL_TABLET | Freq: Three times a day (TID) | ORAL | 1 refills | Status: DC | PRN
Start: 2023-04-04 — End: 2023-04-07

## 2023-04-07 ENCOUNTER — Encounter

## 2023-04-07 NOTE — Telephone Encounter (Signed)
 Patient states the pharmacy doesn't have her prescription refill for diazePAM  (VALIUM ) 5 MG tablet     [id number] - [place], [place] - [address] [place] [place] [phone] [facility] [phone]    Patient also wants a callback to discuss the colonoscopy the Dr recommended she get done.    Lindsay [name]   [phone]

## 2023-04-07 NOTE — Telephone Encounter (Signed)
 Message forwarded to PCP to resend RX. Patient did send a mychart message about the scheduling for her colonoscopy timing & this was forwarded to PCP to review.

## 2023-04-08 MED ORDER — DIAZEPAM 5 MG PO TABS
5 MG | ORAL_TABLET | Freq: Three times a day (TID) | ORAL | 1 refills | Status: AC | PRN
Start: 2023-04-08 — End: 2023-06-07

## 2023-04-13 ENCOUNTER — Encounter

## 2023-05-10 ENCOUNTER — Encounter

## 2023-05-10 MED ORDER — PREGABALIN 150 MG PO CAPS
150 MG | ORAL_CAPSULE | Freq: Two times a day (BID) | ORAL | 1 refills | Status: DC
Start: 2023-05-10 — End: 2023-08-11

## 2023-05-15 ENCOUNTER — Encounter

## 2023-05-16 MED ORDER — BUPROPION HCL ER (XL) 150 MG PO TB24
150 MG | ORAL_TABLET | Freq: Every morning | ORAL | 0 refills | Status: DC
Start: 2023-05-16 — End: 2023-08-28

## 2023-05-18 ENCOUNTER — Ambulatory Visit: Admit: 2023-05-18 | Payer: MEDICARE | Primary: Internal Medicine

## 2023-05-18 ENCOUNTER — Ambulatory Visit
Admit: 2023-05-18 | Discharge: 2023-05-18 | Payer: MEDICARE | Attending: Physician Assistant | Primary: Internal Medicine

## 2023-05-18 DIAGNOSIS — Z981 Arthrodesis status: Secondary | ICD-10-CM

## 2023-05-18 NOTE — Progress Notes (Signed)
 Lindsay [name] (DOB: 12-29-1955) is a 67 y.o. female, patient, here for evaluation of the following chief complaint(s):  Lower Back Pain (Previous sx 06/03/2021 T10 - L2 POSTERIOR SPINAL FUSION WITH T11 - T12 THORACIC LAMINECTOMY with Dr. Zenaida Niece. Bilateral leg

## 2023-05-18 NOTE — Progress Notes (Signed)
 1. Have you been to the ER, urgent care clinic since your last visit?  Hospitalized since your last visit? No    2. Have you seen or consulted any other health care providers outside of the [facility] since your last visit?  Include any pap

## 2023-05-30 ENCOUNTER — Encounter: Admit: 2023-05-30 | Admitting: Internal Medicine

## 2023-05-30 DIAGNOSIS — F339 Major depressive disorder, recurrent, unspecified: Secondary | ICD-10-CM

## 2023-05-30 MED ORDER — ROPINIROLE HCL 1 MG PO TABS
1 | ORAL_TABLET | ORAL | 2 refills | Status: DC
Start: 2023-05-30 — End: 2024-02-27

## 2023-05-31 ENCOUNTER — Encounter: Admit: 2023-05-31 | Admitting: Internal Medicine

## 2023-05-31 DIAGNOSIS — F339 Major depressive disorder, recurrent, unspecified: Secondary | ICD-10-CM

## 2023-05-31 NOTE — Telephone Encounter (Signed)
 The patient is requesting a Rx refill on the medication listed below: buPROPion (WELLBUTRIN XL) 150 MG extended release tablet   [id number] - [place], [place] - [address] - P [phone] - F [phone]

## 2023-05-31 NOTE — Telephone Encounter (Signed)
 Detailed v/m left for patient notifying her Rx was already sent on 11/11 & she needs to follow up with her pharmacy for pickup status. Encouraged she speak with a member of the pharmacy staff directly & not the automated system.

## 2023-05-31 NOTE — Telephone Encounter (Signed)
FYI: patient returning call to inform that she did get the medication just had forgotten

## 2023-06-09 ENCOUNTER — Encounter: Admit: 2023-06-09 | Admitting: Physician Assistant

## 2023-06-09 DIAGNOSIS — Z981 Arthrodesis status: Secondary | ICD-10-CM

## 2023-06-09 NOTE — Progress Notes (Signed)
 MRI order resent. Initial order sent on 05/18/23, patient reports she was never called to schedule.

## 2023-06-10 ENCOUNTER — Encounter: Admit: 2023-06-10 | Admitting: Internal Medicine

## 2023-06-10 DIAGNOSIS — F413 Other mixed anxiety disorders: Secondary | ICD-10-CM

## 2023-06-10 MED ORDER — DIAZEPAM 5 MG PO TABS
5 MG | ORAL_TABLET | ORAL | 1 refills | Status: DC
Start: 2023-06-10 — End: 2023-08-10

## 2023-06-10 MED ORDER — ATORVASTATIN CALCIUM 10 MG PO TABS
10 MG | ORAL_TABLET | ORAL | 1 refills | Status: DC
Start: 2023-06-10 — End: 2023-08-14

## 2023-06-10 NOTE — Telephone Encounter (Signed)
 Patient called to check on prescriptions

## 2023-06-15 NOTE — Telephone Encounter (Signed)
 Spoke with Mrs. Weld notified her that the order for MRI has been re faxed and I gave her the number to the scheduling line.

## 2023-06-18 ENCOUNTER — Inpatient Hospital Stay: Admit: 2023-06-18 | Payer: MEDICARE | Primary: Internal Medicine

## 2023-06-18 DIAGNOSIS — Z981 Arthrodesis status: Secondary | ICD-10-CM

## 2023-07-14 NOTE — Telephone Encounter (Signed)
Patient called regarding her appointment on 07/18/2023. This appointment was to go over her colonoscopy results. However her appointment was cancelled due to the facility closing due to water supply.     New colonoscopy appointment scheduled for August 15, 2023    New appointment scheduled with Dr. Darl Pikes 08/29/2023 11:30

## 2023-07-14 NOTE — Telephone Encounter (Signed)
Noted  

## 2023-07-18 ENCOUNTER — Encounter: Payer: MEDICARE | Attending: Internal Medicine | Primary: Internal Medicine

## 2023-07-28 ENCOUNTER — Ambulatory Visit: Admit: 2023-07-28 | Discharge: 2023-07-28 | Payer: MEDICARE | Attending: Specialist | Primary: Internal Medicine

## 2023-07-28 VITALS — Ht 63.0 in | Wt 175.0 lb

## 2023-07-28 DIAGNOSIS — G8929 Other chronic pain: Secondary | ICD-10-CM

## 2023-07-28 NOTE — Progress Notes (Signed)
1. Have you been to the ER, urgent care clinic since your last visit?  Hospitalized since your last visit?No    2. Have you seen or consulted any other health care providers outside of the [facility] since your last visit?  Include any pap smears or colon screening. No      Chief Complaint   Patient presents with    Follow-up     MRI results

## 2023-07-28 NOTE — Progress Notes (Signed)
Lindsay [name] (DOB: 03/01/1956) is a 68 y.o. female, patient, here for evaluation of the following chief complaint(s):  Follow-up (MRI results)       ASSESSMENT/PLAN:    Below is the assessment and plan developed based on review of pertinent history, physical exam, labs, studies, and medications.     1. Chronic bilateral low back pain with right-sided sciatica  -     EOS XR FULL SPINE 2 VIEWS; Future  2. S/P lumbar fusion  -     EOS XR FULL SPINE 2 VIEWS; Future  3. S/P spinal fusion  -     EOS XR FULL SPINE 2 VIEWS; Future      Assessment & Plan  1. Postoperative status following T10-L2 fusion.  She reports persistent lower back pain that worsens with walking, standing, and sitting.  The pain is worsened since her last visit 2 years ago.  She has difficulty with prolonged ambulation and standing and had a number of falls.  She has spondylolisthesis now at L3-4 and L4-5 and a degenerative spondylosis at L5-S1.  I think she is a candidate for revision laminectomy at those levels with an extension of her fusion down to the pelvis.  Risk and benefits were discussed with her.        Procedure: Revision laminectomy L3-S1 with a T10 to ilium revision fusion      The risks and benefits were discussed at length with the patient and the patient has elected to proceed.  Indications for surgery include failed conservative treatment.  Alternative treatments, risks and the perioperative course were discussed with the patient.  All questions were answered.  The risks and benefits of the procedure were explained.  Benefits include definitive diagnosis, relief of pain, elimination of deformity and improved function.  Risks of surgery including bleeding, infection, weakness, numbness, CSF leak, failure to improve symptoms, exacerbation of medical co-morbidities and even death were discussed with the patient.   2. Lumbar spondylolisthesis, L3-4, L4-5, and L5-S1 with lumbar stenosis.  She has lumbar spondylolisthesis at L3-4,  L4-5, and L5-S1 with associated lumbar stenosis. This condition contributes to her chronic lower back pain and frequent falls due to instability. The plan is to proceed with the extension of her fusion to the pelvis to stabilize the affected areas.    PROCEDURE  The patient underwent a T10-L2 fusion procedure.    No follow-ups on file.      SUBJECTIVE/OBJECTIVE:    Lindsay [name] (DOB: [birth year]) is a 68 y.o. female.       History of Present Illness  The patient presents for evaluation of lower back pain. She is accompanied by her husband.    She reports persistent discomfort in the lower region of her back, which has been a constant source of distress. The pain intensifies during periods of walking, standing, and sitting. Her mobility is compromised due to frequent falls, attributed to an imbalance. Despite attempts to maintain an upright posture, she continues to experience significant pain. Her husband notes that each fall appears to exacerbate her condition, leading to a perceived regression in her overall health status. She expresses concern about the potential impact of further fusion surgeries on her movement.            No data to display                      No data to display  Pertinent Findings:  Results        AP, lateral, flexion-extension views of the lumbar spine reveal anterolisthesis at L5-S1.  Mild to moderate disc degeneration throughout the lumbar spine.  Prior T12 kyphoplasty.  Stable prior T10-L2 fusion.  Stable hardware.  No acute changes noted compared to July 2023 films.  No obvious acute fractures lytic lesions noted.         MRI Result (most recent):  MRI LUMBAR SPINE WO CONTRAST 06/18/2023    Narrative  EXAM: MRI LUMBAR SPINE WO CONTRAST    INDICATION: Arthrodesis status; Lumbago with sciatica, right side; Other chronic  pain. Arthrodesis status / With STIR images    COMPARISON: None    TECHNIQUE: MR imaging of the lumbar spine was performed using the following  sequences:  sagittal T1, T2, STIR;  axial T1, T2.    CONTRAST:  None.    FINDINGS:    There is a posterior fusion including T10, T11 L1 and L2, bridging the healed  burst fracture of T12. A moderate amount of blooming artifact is present from  the posterior fusion but the screws appear to be centrally located as best  assessed by MRI.    There is normal alignment of the lumbar spine. Marrow signal is normal. There is  a Schmorl's node resulting in a small concavity of the superior endplate of L4.    The conus medullaris terminates at L1. Signal and caliber of the distal spinal  cord are within normal limits.    The paraspinal soft tissues are within normal limits.    Lower thoracic spine: T11-12: Mild bilateral neural foraminal stenosis and  central stenosis at about 8.7 mm..    L1-L2: No herniation or stenosis.    L2-L3: No herniation or stenosis.    L3-L4: No herniation or stenosis.    L4-L5: No herniation or stenosis.    L5-S1: No herniation or stenosis.    Impression  Posterior fusion from T10-L2 without hardware failure  Healed T12 burst fracture with mild central stenosis    Electronically signed by Steele Sizer      No Known Allergies    Current Outpatient Medications   Medication Sig Dispense Refill    diazePAM (VALIUM) 5 MG tablet TAKE 1 TABLET BY MOUTH EVERY 8 HOURS AS NEEDED FOR ANXIETY OR SLEEP 90 tablet 1    atorvastatin (LIPITOR) 10 MG tablet TAKE 1 TABLET BY MOUTH EVERY DAY 90 tablet 1    rOPINIRole (REQUIP) 1 MG tablet TAKE 1 TABLET AT 1-2PM AND TAKE 3 TABLETS BY MOUTH 1 TO 3 HOURS BEFORE BEDTIME EVERYDAY 360 tablet 2    buPROPion (WELLBUTRIN XL) 150 MG extended release tablet TAKE 1 TABLET BY MOUTH EVERY MORNING 90 tablet 0    pregabalin (LYRICA) 150 MG capsule Take 1 capsule by mouth 2 times daily for 180 days. Max Daily Amount: 300 mg 90 capsule 1    acetaminophen (TYLENOL) 325 MG tablet Take by mouth every 4 hours as needed      aspirin 81 MG chewable tablet Take by mouth daily      vitamin D3  (CHOLECALCIFEROL) 125 MCG (5000 UT) TABS tablet TAKE 1 TABLET BY MOUTH DAILY       No current facility-administered medications for this visit.       Past Medical History:   Diagnosis Date    Abnormal brain scan     Anxiety disorder     Arthritis     Chronic pain  Depression     DVT (deep venous thrombosis) (HCC)     Falls frequently     Fracture of left ankle     Headache(784.0) 2011    Ill-defined condition 09/15/2015    FX R HAND    Neurocutaneous syndrome (HCC)     Obesity Mar. '23 - Aug. '24    Other and unspecified hyperlipidemia 12/27/2012    PE (pulmonary embolism) 12/2012    RLS (restless legs syndrome) 03/16/2016    Dr Scarlett Presto    TIA (transient ischemic attack) 03/2016    Right facial droop, numbness in lip    Torn rotator cuff     right        Past Surgical History:   Procedure Laterality Date    APPENDECTOMY      BREAST SURGERY  2002    AUGMENTATION    CESAREAN SECTION      x4    GI      COLONOSCOPY    IMPLANT BREAST SILICONE/EQ  2002    Bilateral breast implants    IR KYPHOPLASTY THORACIC 1 VERTEBRAL BODY  12/12/2019    IR KYPHOPLASTY THORACIC FIRST LEVEL 12/12/2019 [facility] IR    IR KYPHOPLASTY THORACIC 1 VERTEBRAL BODY  12/12/2019    JOINT REPLACEMENT  2016 knees    LUMBAR FUSION  06/02/2021    ORTHOPEDIC SURGERY  08/2004    rotater cuff r. shoulder    ORTHOPEDIC SURGERY      left ankle fracture treated with air cast    ORTHOPEDIC SURGERY  12/2011    left shoulder fxt and dislocation    OTHER SURGICAL HISTORY  2010    left foot torn tendon    TOTAL KNEE ARTHROPLASTY Bilateral 10/29/2015    TUBAL LIGATION         Family History   Problem Relation Age of Onset    No Known Problems Son     No Known Problems Daughter     No Known Problems Daughter     Heart Disease Mother     Other Brother         Covid    Rheum Arthritis Sister     Cancer Father         thyroid cancer    Heart Disease Father     No Known Problems Daughter     Anesth Problems Neg Hx         Social History     Tobacco Use     Smoking status: Never     Passive exposure: Past    Smokeless tobacco: Never   Vaping Use    Vaping status: Never Used    Passive vaping exposure: Yes   Substance Use Topics    Alcohol use: No    Drug use: No        Review of Systems      Vitals:  Ht 1.6 m (5\' 3" )   Wt 79.4 kg (175 lb)   BMI 31.00 kg/m    Body mass index is 31 kg/m.    Ortho Exam     Alert and Orient  x 3    Normal gait and station; normal posture    No assistive devices today.       Pertinent Physial Exam Findings:  Physical Exam     Integumentary  Assessment of Surgical Incision - healing and consistent with normal anticipated wound healing.    Neurologic  Sensory  Light Touch - Intact -  Globally.  Overall Assessment of Muscle Strength and Tone reveals  Lower Extremities - Right Iliopsoas - 5/5. Left Iliopsoas - 5/5. Right Tibialis Anterior - 5/5. Left Tibialis Anterior - 5/5. Right Gastroc-Soleus - 5/5. Left Gastroc-Soleus - 5/5. Right EHL - 5/5. Left EHL - 5/5.  General Assessment of Reflexes  Right Ankle - Clonus is not present. Left Ankle - Clonus is not present.  Reflexes (Dermatomes)  2/2 Normal - Left Achilles (L5-S2), Left Knee (L2-4), Right Achilles (L5-S2) and Right Knee (L2-4).    Musculoskeletal  Global Assessment  Examination of related systems reveals - well-developed, well-nourished, in no acute distress, alert and oriented x 3 and normal coordination.  Spine/Ribs/Pelvis  Lumbosacral Spine - Examination of the lumbosacral spine reveals - no known fractures or deformities. Inspection and Palpation - Tenderness - moderate. Assessment of pain reveals the following findings - The pain is characterized as - moderate. Location - pain refers to lower back bilaterally.       An electronic signature was used to authenticate this note.  -- Kem Parkinson, MD       The patient (or guardian, if applicable) and other individuals in attendance with the patient were advised that Artificial Intelligence will be utilized during this visit to  record and process the conversation to generate a clinical note. The patient (or guardian, if applicable) and other individuals in attendance at the appointment consented to the use of AI, including the recording.

## 2023-07-28 NOTE — Patient Instructions (Signed)
Patient Education        Low Back Pain: Exercises  Introduction  Here are some examples of exercises for you to try. The exercises may be suggested for a condition or for rehabilitation. Start each exercise slowly. Ease off the exercises if you start to have pain.  You will be told when to start these exercises and which ones will work best for you.  How to do the exercises  Hamstring stretch in a doorway    Sit on the floor close to a doorway. Be sure to stretch your affected leg first.  Lie down with your other leg through the doorway.  Slide your affected leg up the wall to straighten your knee. Don't point your toes. You should feel a gentle stretch down the back of your leg. Be sure to:  Hold the stretch for at least 1 minute. Then over time, try to lengthen the time you hold the stretch to as long as 6 minutes.  Repeat 2 to 4 times.  It's a good idea to repeat these steps with your other leg.  To stretch your right leg, scoot to the right side of the doorway.  To stretch your left leg, scoot to the left side of the doorway.  Keep both knees straight.  Keep your back flat and your other heel on the floor.  If you do not have a place to do this exercise in a doorway, there is another way to do it:  Lie on your back, and bend the knee of your affected leg.  Loop a towel under the ball and toes of that foot, and hold the ends of the towel in your hands.  Straighten your knee as you raise that foot into the air. Slowly pull back on the towel. You should feel a gentle stretch down the back of your leg.  Hold the stretch for 15 to 30 seconds. Or even better, hold the stretch for 1 minute if you can.  Repeat 2 to 4 times.  It's a good idea to repeat these steps with your other leg.  Single knee-to-chest stretch    Lie on your back with your knees bent and your feet flat on the floor. You can put a small pillow under your head and neck if it is more comfortable.  Clasp your hands under one knee and bring the knee to  your chest. Keep your other foot flat on the floor.  Keep your lower back pressed to the floor. Hold the stretch for at least 15 to 30 seconds.  Relax and lower the knee to the starting position.  Repeat 2 to 4 times with each leg.  To get more stretch, put your other leg flat on the floor while pulling your knee to your chest.  Hip flexor stretch (kneeling)    Kneel on your affected leg and bend your other leg out in front of you, with that foot flat on the floor. If you feel discomfort in the front of your knee, place a towel under your knee.  Keeping your back straight, slowly push your hips forward. You should feel a stretch in the upper thigh of your back leg and hip.  Hold the stretch for at least 15 to 30 seconds.  Repeat 2 to 4 times.  It's a good idea to repeat these steps with your other leg.  Press-up back extension    Lie on your stomach, supporting your body with your forearms. Keep your elbows below  your shoulders.  Press your elbows down into the floor to raise your upper back. As you do this, relax your stomach muscles and allow your back to arch without using your back muscles. Don't let your hips or pelvis come off the floor.  Hold for 15 to 30 seconds, then relax.  Repeat 2 to 4 times.  Pelvic tilt    Lie on your back with your knees bent and your feet flat on the floor.  Tighten your belly muscles by pulling your belly button in toward your spine. Press your lower back to the floor. You should feel your hips and pelvis rock back.  Hold for about 6 seconds while breathing smoothly, and then relax.  Repeat 8 to 12 times.  Alternate arm and leg (bird dog)    Start on the floor, on your hands and knees.  Tighten your belly muscles by pulling your belly button in toward your spine. Be sure you continue to breathe normally and do not hold your breath.  Keeping your back and neck straight, raise one arm off the floor and hold it straight out in front of you. Be careful not to let your shoulder drop  down, because that will twist your trunk.  Hold for about 6 seconds, then lower your arm and switch to your other arm. Over time, work up to holding for 10 to 30 seconds each time.  Repeat 8 to 12 times with each arm.  When you feel steady and strong doing this exercise with your arms, try doing the exercise with your legs instead. Raise one leg and hold it straight out behind you. Be careful not to let your hip drop down, because that will twist your trunk.  When holding your leg straight out becomes easier, try raising your opposite arm at the same time.  Curl-up (arms crossed)    Lie on your back on the floor, with your knees bent and your feet flat on the floor. (Or you can lie on any flat surface, such as a bed.)  Cross your arms over your chest. If this bothers your neck, try putting your hands behind your neck (not your head), with your elbows spread apart.  Slowly tighten your belly muscles and raise your head and shoulder blades off the floor. Keep breathing normally and don't hold your breath.  Keep your neck straight and do not press your chin to your chest.  Hold for 1 to 2 seconds. Then slowly lower yourself back to the floor.  Repeat 8 to 12 times.  Bridging (heel dig)    Lie on your back with both knees bent and your ankles bent so that only your heels are digging into the floor. Your knees should be bent about 90 degrees.  Tighten your belly muscles by pulling your belly button in toward your spine. Keep breathing normally and don't hold your breath.  Push your heels into the floor, squeeze your buttocks, and lift your hips off the floor until your shoulders, hips, and knees are all in a straight line. Keep your hips level.  Hold for about 6 seconds.  Slowly lower your hips back to the floor.  Repeat 8 to 12 times.  Back press    Stand with your back 10 to 12 inches away from a wall.  Lean into the wall until your back is against it. Press your lower back against the wall by pulling in your stomach  muscles.  Slowly slide down until your knees  are slightly bent, pressing your lower back into the wall.  Hold for at least 6 seconds, then slide back up the wall.  Repeat 8 to 12 times.  Over time, work up to holding this position for as much as 1 minute.  Follow-up care is a key part of your treatment and safety. Be sure to make and go to all appointments, and call your doctor if you are having problems. It's also a good idea to know your test results and keep a list of the medicines you take.  Current as of: February 02, 2023  Content Version: 14.3   [address], [place].   Care instructions adapted under license by [facility]. If you have questions about a medical condition or this instruction, always ask your healthcare professional. [place]rene [place], [facility], disclaims any warranty or liability for your use of this information.     Patient Education        Lumbar Spinal Fusion: Before Your Surgery  What is lumbar spinal fusion?     Spinal fusion is surgery that joins, or fuses, two or more vertebrae together. The joints will no longer be able to move. The surgery is also called arthrodesis.  Most of the time, bone from your pelvic bone or from a bone bank is used. Or sometimes human-made bone is used. This bone is used to make a "bridge" between the vertebrae to be joined. Metal rods, wires, or screws are often attached to the vertebrae. This will hold them together until new bone grows between them.  Spinal fusion surgery usually takes a few hours. It involves making a cut in your back, belly, or side. The cuts, called incisions, leave scars that fade with time.  You can expect your back to feel stiff and sore after surgery. You will be given pain medicine. You will probably get up and walk at the hospital. You will have a short hospital stay.  It may take 4 to 6 weeks to get back to doing simple activities, such as light housework or office work.  How do you prepare for surgery?  Surgery can be stressful.  This information will help you understand what you can expect. And it will help you safely prepare for surgery.  Preparing for surgery    You may need to shower or bathe with a special soap the night before and the morning of your surgery. The soap contains chlorhexidine. It reduces the amount of bacteria on your skin that could cause an infection after surgery.     Be sure you have someone to take you home. Anesthesia and pain medicine will make it unsafe for you to drive or get home on your own.     Understand exactly what surgery is planned, along with the risks, benefits, and other options.     If you take a medicine that prevents blood clots, your doctor may tell you to stop taking it before your surgery. Or your doctor may tell you to keep taking it. (These medicines include aspirin and other blood thinners.) Make sure that you understand exactly what your doctor wants you to do.     Tell your doctor ALL the medicines, vitamins, supplements, and herbal remedies you take. Some may increase the risk of problems during your surgery. Your doctor will tell you if you should stop taking any of them before the surgery and how soon to do it.     Make sure your doctor and the hospital have a  copy of your advance directive. If you don't have one, you may want to prepare one. It lets others know your health care wishes. It's a good thing to have before any type of surgery or procedure.   What happens on the day of surgery?   Follow the instructions exactly about when to stop eating and drinking. If you don't, your surgery may be canceled. If your doctor told you to take your medicines on the day of surgery, take them with only a sip of water.     Take a bath or shower before you come in for your surgery. Do not apply lotions, perfumes, deodorants, or nail polish.     Do not shave the surgical site yourself.     Take off all jewelry and piercings. And take out contact lenses, if you wear them.   At the hospital or surgery  center   Bring a picture ID.     The area for surgery is often marked to make sure there are no errors.     You will be kept comfortable and safe by your anesthesia provider. You will be asleep during the surgery.     Surgery will take a few hours.   When should you call your doctor?   You have questions or concerns.     You do not understand how to prepare for your surgery.     You become ill before surgery (such as fever, cold or flu, chest pain, or shortness of breath).     You need to reschedule or have changed your mind about having the surgery.   Where can you learn more?  Go to [employer] and enter (912)190-8037 to learn more about "Lumbar Spinal Fusion: Before Your Surgery."  Current as of: February 02, 2023  Content Version: 14.3   [address], [place].   Care instructions adapted under license by [facility]. If you have questions about a medical condition or this instruction, always ask your healthcare professional. [place]rene [place], [facility], disclaims any warranty or liability for your use of this information.

## 2023-08-08 ENCOUNTER — Emergency Department: Payer: MEDICARE | Primary: Internal Medicine

## 2023-08-08 ENCOUNTER — Emergency Department: Admit: 2023-08-08 | Payer: MEDICARE | Primary: Internal Medicine

## 2023-08-08 ENCOUNTER — Inpatient Hospital Stay
Admit: 2023-08-08 | Discharge: 2023-08-08 | Disposition: A | Discharge status: Home / Self Care | Payer: MEDICARE | Attending: Emergency Medicine

## 2023-08-08 DIAGNOSIS — S20219A Contusion of unspecified front wall of thorax, initial encounter: Secondary | ICD-10-CM

## 2023-08-08 LAB — CBC WITH AUTO DIFFERENTIAL
Basophils %: 0.7 % (ref 0.0–1.0)
Basophils Absolute: 0.05 10*3/uL (ref 0.00–0.10)
Eosinophils %: 3.8 % (ref 0.0–7.0)
Eosinophils Absolute: 0.27 10*3/uL (ref 0.00–0.40)
Hematocrit: 31.2 % — ABNORMAL LOW (ref 35.0–47.0)
Hemoglobin: 9.8 g/dL — ABNORMAL LOW (ref 11.5–16.0)
Immature Granulocytes %: 0.3 % (ref 0.0–0.5)
Immature Granulocytes Absolute: 0.02 10*3/uL (ref 0.00–0.04)
Lymphocytes %: 27.8 % (ref 12.0–49.0)
Lymphocytes Absolute: 1.99 10*3/uL (ref 0.80–3.50)
MCH: 24 pg — ABNORMAL LOW (ref 26.0–34.0)
MCHC: 31.4 g/dL (ref 30.0–36.5)
MCV: 76.3 fL — ABNORMAL LOW (ref 80.0–99.0)
MPV: 9.5 fL (ref 8.9–12.9)
Monocytes %: 8.1 % (ref 5.0–13.0)
Monocytes Absolute: 0.58 10*3/uL (ref 0.00–1.00)
Neutrophils %: 59.3 % (ref 32.0–75.0)
Neutrophils Absolute: 4.25 10*3/uL (ref 1.80–8.00)
Nucleated RBCs: 0 /100{WBCs}
Platelets: 241 10*3/uL (ref 150–400)
RBC: 4.09 M/uL (ref 3.80–5.20)
RDW: 19.9 % — ABNORMAL HIGH (ref 11.5–14.5)
WBC: 7.2 10*3/uL (ref 3.6–11.0)
nRBC: 0 10*3/uL (ref 0.00–0.01)

## 2023-08-08 LAB — EKG 12-LEAD
Atrial Rate: 82 {beats}/min
Diagnosis: NORMAL
P Axis: 60 degrees
P-R Interval: 140 ms
Q-T Interval: 392 ms
QRS Duration: 80 ms
QTc Calculation (Bazett): 457 ms
R Axis: 26 degrees
T Axis: 62 degrees
Ventricular Rate: 82 {beats}/min

## 2023-08-08 LAB — COMPREHENSIVE METABOLIC PANEL
ALT: 21 U/L (ref 12–78)
AST: 20 U/L (ref 15–37)
Albumin/Globulin Ratio: 0.9 — ABNORMAL LOW (ref 1.1–2.2)
Albumin: 3.4 g/dL — ABNORMAL LOW (ref 3.5–5.0)
Alk Phosphatase: 171 U/L — ABNORMAL HIGH (ref 45–117)
Anion Gap: 6 mmol/L (ref 2–12)
BUN/Creatinine Ratio: 21 — ABNORMAL HIGH (ref 12–20)
BUN: 18 mg/dL (ref 6–20)
CO2: 27 mmol/L (ref 21–32)
Calcium: 9 mg/dL (ref 8.5–10.1)
Chloride: 106 mmol/L (ref 97–108)
Creatinine: 0.84 mg/dL (ref 0.55–1.02)
Est, Glom Filt Rate: 75 mL/min/{1.73_m2} (ref >60)
Globulin: 3.6 g/dL (ref 2.0–4.0)
Glucose: 117 mg/dL — ABNORMAL HIGH (ref 65–100)
Potassium: 4.1 mmol/L (ref 3.5–5.1)
Sodium: 139 mmol/L (ref 136–145)
Total Bilirubin: 0.2 mg/dL (ref 0.2–1.0)
Total Protein: 7 g/dL (ref 6.4–8.2)

## 2023-08-08 LAB — TROPONIN
Troponin, High Sensitivity: <4 ng/L (ref 0–51)
Troponin, High Sensitivity: <4 ng/L (ref 0–51)

## 2023-08-08 MED ORDER — OXYCODONE HCL 5 MG PO TABS
5 | ORAL_TABLET | Freq: Four times a day (QID) | ORAL | 0 refills | Status: AC | PRN
Start: 2023-08-08 — End: 2023-08-11

## 2023-08-08 MED ORDER — OXYCODONE HCL 5 MG PO TABS
5 | ORAL | Status: AC
Start: 2023-08-08 — End: 2023-08-08
  Administered 2023-08-08: 12:00:00 5 mg via ORAL

## 2023-08-08 MED ORDER — KETOROLAC TROMETHAMINE 30 MG/ML IJ SOLN
30 | Freq: Once | INTRAMUSCULAR | Status: AC
Start: 2023-08-08 — End: 2023-08-08
  Administered 2023-08-08: 09:00:00 15 mg via INTRAVENOUS

## 2023-08-08 MED ORDER — MELOXICAM 7.5 MG PO TABS
7.5 MG | ORAL_TABLET | Freq: Every day | ORAL | 0 refills | Status: DC
Start: 2023-08-08 — End: 2023-08-12

## 2023-08-08 MED ORDER — ACETAMINOPHEN 500 MG PO TABS
500 | ORAL | Status: AC
Start: 2023-08-08 — End: 2023-08-08
  Administered 2023-08-08: 09:00:00 1000 mg via ORAL

## 2023-08-08 MED ORDER — LIDOCAINE 4 % EX PTCH
4 | MEDICATED_PATCH | Freq: Every day | CUTANEOUS | 0 refills | Status: AC
Start: 2023-08-08 — End: 2023-09-07

## 2023-08-08 MED ORDER — LIDOCAINE 4 % EX PTCH
4 | CUTANEOUS | Status: DC
Start: 2023-08-08 — End: 2023-08-08
  Administered 2023-08-08: 12:00:00 1 via TRANSDERMAL

## 2023-08-08 MED ORDER — METHYLPREDNISOLONE 4 MG PO TBPK
4 | ORAL_TABLET | ORAL | 0 refills | Status: AC
Start: 2023-08-08 — End: 2023-08-14

## 2023-08-08 MED ORDER — IOPAMIDOL 76 % IV SOLN
76 | Freq: Once | INTRAVENOUS | Status: AC | PRN
Start: 2023-08-08 — End: 2023-08-08
  Administered 2023-08-08: 10:00:00 100 mL via INTRAVENOUS

## 2023-08-08 MED FILL — SALONPAS PAIN RELIEVING 4 % EX PTCH: 4 % | CUTANEOUS | Qty: 1

## 2023-08-08 MED FILL — ACETAMINOPHEN 500 MG PO TABS: 500 MG | ORAL | Qty: 2

## 2023-08-08 MED FILL — OXYCODONE HCL 5 MG PO TABS: 5 MG | ORAL | Qty: 1

## 2023-08-08 MED FILL — ISOVUE-370 76 % IV SOLN: 76 % | INTRAVENOUS | Qty: 100

## 2023-08-08 MED FILL — KETOROLAC TROMETHAMINE 30 MG/ML IJ SOLN: 30 MG/ML | INTRAMUSCULAR | Qty: 1

## 2023-08-08 NOTE — ED Notes (Signed)
Patient placed on 2L NC to maintain O2 saturation.

## 2023-08-08 NOTE — Discharge Instructions (Addendum)
Thank You!    It was a pleasure taking care of you in our Emergency Department today. We know that when you come to our Emergency Department, you are entrusting [place] with your health, comfort, and safety. Our physicians and nurses honor that trust, and truly appreciate the opportunity to care for you and your loved ones.      We also value your feedback. If you receive a survey about your Emergency Department experience today, please fill it out.  We care about our patients' feedback, and we listen to what you have to say.  Thank you.    Corky Crafts, MD  ________________________________________________________________________  I have included a copy of your lab results and/or radiologic studies from today's visit so you can have them easily available at your follow-up visit. We hope you feel better and please do not hesitate to contact the ED if you have any questions at all!    Recent Results (from the past 12 hour(s))   EKG 12 Lead    Collection Time: 08/08/23  1:28 AM   Result Value Ref Range    Ventricular Rate 82 BPM    Atrial Rate 82 BPM    P-R Interval 140 ms    QRS Duration 80 ms    Q-T Interval 392 ms    QTc Calculation (Bazett) 457 ms    P Axis 60 degrees    R Axis 26 degrees    T Axis 62 degrees    Diagnosis       Normal sinus rhythm  Low voltage QRS  Borderline ECG  When compared with ECG of 13-Mar-2023 19:14,  No significant change was found     CBC with Auto Differential    Collection Time: 08/08/23  3:00 AM   Result Value Ref Range    WBC 7.2 3.6 - 11.0 K/uL    RBC 4.09 3.80 - 5.20 M/uL    Hemoglobin 9.8 (L) 11.5 - 16.0 g/dL    Hematocrit 91.4 (L) 35.0 - 47.0 %    MCV 76.3 (L) 80.0 - 99.0 FL    MCH 24.0 (L) 26.0 - 34.0 PG    MCHC 31.4 30.0 - 36.5 g/dL    RDW 78.2 (H) 95.6 - 14.5 %    Platelets 241 150 - 400 K/uL    MPV 9.5 8.9 - 12.9 FL    Nucleated RBCs 0.0 0 PER 100 WBC    nRBC 0.00 0.00 - 0.01 K/uL    Neutrophils % 59.3 32.0 - 75.0 %    Lymphocytes % 27.8 12.0 - 49.0 %    Monocytes % 8.1 5.0 - 13.0  %    Eosinophils % 3.8 0.0 - 7.0 %    Basophils % 0.7 0.0 - 1.0 %    Immature Granulocytes % 0.3 0.0 - 0.5 %    Neutrophils Absolute 4.25 1.80 - 8.00 K/UL    Lymphocytes Absolute 1.99 0.80 - 3.50 K/UL    Monocytes Absolute 0.58 0.00 - 1.00 K/UL    Eosinophils Absolute 0.27 0.00 - 0.40 K/UL    Basophils Absolute 0.05 0.00 - 0.10 K/UL    Immature Granulocytes Absolute 0.02 0.00 - 0.04 K/UL    Differential Type AUTOMATED     Troponin    Collection Time: 08/08/23  3:00 AM   Result Value Ref Range    Troponin, High Sensitivity <4 0 - 51 ng/L   Comprehensive Metabolic Panel    Collection Time: 08/08/23  3:00 AM   Result  Value Ref Range    Sodium 139 136 - 145 mmol/L    Potassium 4.1 3.5 - 5.1 mmol/L    Chloride 106 97 - 108 mmol/L    CO2 27 21 - 32 mmol/L    Anion Gap 6 2 - 12 mmol/L    Glucose 117 (H) 65 - 100 mg/dL    BUN 18 6 - 20 MG/DL    Creatinine 1.61 0.96 - 1.02 MG/DL    BUN/Creatinine Ratio 21 (H) 12 - 20      Est, Glom Filt Rate 75 >60 ml/min/1.68m2    Calcium 9.0 8.5 - 10.1 MG/DL    Total Bilirubin 0.2 0.2 - 1.0 MG/DL    ALT 21 12 - 78 U/L    AST 20 15 - 37 U/L    Alk Phosphatase 171 (H) 45 - 117 U/L    Total Protein 7.0 6.4 - 8.2 g/dL    Albumin 3.4 (L) 3.5 - 5.0 g/dL    Globulin 3.6 2.0 - 4.0 g/dL    Albumin/Globulin Ratio 0.9 (L) 1.1 - 2.2     Troponin    Collection Time: 08/08/23  5:41 AM   Result Value Ref Range    Troponin, High Sensitivity <4 0 - 51 ng/L     CTA CHEST W WO CONTRAST   Final Result   No acute pulmonary embolus.         Electronically signed by Eual Fines          The exam and treatment you received in the Emergency Department were for an urgent problem and are not intended as complete care. It is important that you follow up with a doctor, nurse practitioner, or physician assistant for ongoing care. If your symptoms become worse or you do not improve as expected and you are unable to reach your usual health care provider, you should return to the Emergency Department. We are available 24  hours a day.    Please take your discharge instructions with you when you go to your follow-up appointment.     If a prescription has been provided, please have it filled as soon as possible to prevent a delay in treatment. Read the entire medication instruction sheet provided to you by the pharmacy. If you have any questions or reservations about taking the medication due to side effects or interactions with other medications, please call your primary care physician or contact the ER to speak with the charge nurse.     Please make an appointment with your family doctor or the physician you were referred to for follow-up of this visit as instructed on your discharge paperwork. Return to the ER if you are unable to be seen or if you are unable to be seen in a timely manner.    Should you experience abdominal pain lasting greater than 6 hours, chest pain, difficulty breathing, fever/chills, numbness/tingling, skin changes or other symptoms that concern you, return to the ED sooner. If you feel worse over the next 24 hours, please return to the ED. We are available 24 hours a day. Thank you for trusting [place] with your care!

## 2023-08-08 NOTE — ED Provider Notes (Signed)
[facility]  EMERGENCY DEPARTMENT ENCOUNTER       Pt Name: Lindsay [name]  MRN: [id number]  Birthdate Mar 05, 1956  Date of evaluation: 08/08/2023  Provider: Corky Crafts, MD   PCP: Clarene Critchley, MD  Note Started: 6:26 AM 08/08/23     CHIEF COMPLAINT       Chief Complaint   Patient presents with    Chest Injury     Ambulatory with c/o chest pain after falling and landing on the sink in her bathroom right on her sternum. Reports ongoing pain in the sternum that radiates to the R side of the chest.         HISTORY OF PRESENT ILLNESS: 1 or more elements      History From: Patient, History limited by: None     Lindsay [name] is a 68 y.o. 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  She reports she fell 1/22 tripping and striking her mid chest on her ceramic sink.  She has had persistent pain which have been improving however over the past 3 to 4 days she has had worsening of her pain now radiating to her right chest and right arm.  Associated shortness of breath and hypoxia at home into the 70s.     Nursing Notes were all reviewed and agreed with or any disagreements were addressed in the HPI.     REVIEW OF SYSTEMS        Positives and Pertinent negatives as per HPI.    PAST HISTORY     Past Medical History:  Past Medical History:   Diagnosis Date    Abnormal brain scan     Anxiety disorder     Arthritis     Chronic pain     Depression     DVT (deep venous thrombosis) (HCC)     Falls frequently     Fracture of left ankle     Headache(784.0) 2011    Ill-defined condition 09/15/2015    FX R HAND    Neurocutaneous syndrome (HCC)     Obesity Mar. '23 - Aug. '24    Other and unspecified hyperlipidemia 12/27/2012    PE (pulmonary embolism) 12/2012    RLS (restless legs syndrome) 03/16/2016    Dr Scarlett Presto    TIA (transient ischemic attack) 03/2016    Right facial droop, numbness in lip    Torn rotator cuff     right        Past Surgical History:  Past Surgical History:   Procedure Laterality Date    APPENDECTOMY      BREAST SURGERY  2002    AUGMENTATION    CESAREAN SECTION      x4    GI      COLONOSCOPY    IMPLANT BREAST SILICONE/EQ  2002    Bilateral breast implants    IR KYPHOPLASTY THORACIC 1 VERTEBRAL BODY  12/12/2019    IR KYPHOPLASTY THORACIC FIRST LEVEL 12/12/2019 [facility] IR    IR KYPHOPLASTY THORACIC 1 VERTEBRAL BODY  12/12/2019    JOINT REPLACEMENT  2016 knees    LUMBAR FUSION  06/02/2021    ORTHOPEDIC SURGERY  08/2004    rotater cuff r. shoulder    ORTHOPEDIC SURGERY      left ankle fracture treated with air cast    ORTHOPEDIC SURGERY  12/2011    left shoulder fxt and  dislocation    OTHER SURGICAL HISTORY  2010    left foot torn tendon    TOTAL KNEE ARTHROPLASTY Bilateral 10/29/2015    TUBAL LIGATION         Family History:  Family History   Problem Relation Age of Onset    No Known Problems Son     No Known Problems Daughter     No Known Problems Daughter     Heart Disease Mother     Other Brother         Covid    Rheum Arthritis Sister     Cancer Father         thyroid cancer    Heart Disease Father     No Known Problems Daughter     Anesth Problems Neg Hx        Social History:  Social History     Tobacco Use    Smoking status: Never     Passive exposure: Past    Smokeless tobacco: Never   Vaping Use    Vaping status: Never Used    Passive vaping exposure: Yes   Substance Use Topics    Alcohol use: No    Drug use: No       Allergies:  No Known Allergies    CURRENT MEDICATIONS      Previous Medications    ACETAMINOPHEN (TYLENOL) 325 MG TABLET    Take by mouth every 4 hours as needed    ASPIRIN 81 MG CHEWABLE TABLET    Take by mouth daily    ATORVASTATIN (LIPITOR) 10 MG TABLET    TAKE 1 TABLET BY MOUTH EVERY DAY    BUPROPION (WELLBUTRIN XL) 150 MG EXTENDED RELEASE TABLET    TAKE 1 TABLET BY MOUTH EVERY MORNING    DIAZEPAM (VALIUM) 5 MG TABLET    TAKE 1 TABLET BY MOUTH EVERY 8 HOURS AS NEEDED FOR ANXIETY OR SLEEP     PREGABALIN (LYRICA) 150 MG CAPSULE    Take 1 capsule by mouth 2 times daily for 180 days. Max Daily Amount: 300 mg    ROPINIROLE (REQUIP) 1 MG TABLET    TAKE 1 TABLET AT 1-2PM AND TAKE 3 TABLETS BY MOUTH 1 TO 3 HOURS BEFORE BEDTIME EVERYDAY    VITAMIN D3 (CHOLECALCIFEROL) 125 MCG (5000 UT) TABS TABLET    TAKE 1 TABLET BY MOUTH DAILY       SCREENINGS        History: 0  ECG: 0  Patient Age: 68  *Risk factors for Atherosclerotic disease: Coronary Artery Disease  Risk Factors: 2  Troponin: 0  Heart Score Total: 4        No data recorded         PHYSICAL EXAM      Vitals:    08/08/23 0415 08/08/23 0445 08/08/23 0500 08/08/23 0545   BP:   98/69 101/71   Pulse: 67 67 69    Resp: 21 15 19     Temp:       TempSrc:       SpO2: 94% 97% 92% 94%   Weight:       Height:            Physical Exam  Vitals and nursing note reviewed.   Constitutional:       Appearance: Normal appearance.   Cardiovascular:      Rate and Rhythm: Normal rate and regular rhythm.   Musculoskeletal:      Right lower leg:  No edema.      Left lower leg: No edema.      Comments: Sternal tenderness to palpation without flail chest, tenderness to right anterior ribs   Skin:     General: Skin is warm and dry.   Neurological:      Mental Status: She is alert.       DIAGNOSTIC RESULTS   LABS:     Recent Results (from the past 12 hour(s))   EKG 12 Lead    Collection Time: 08/08/23  1:28 AM   Result Value Ref Range    Ventricular Rate 82 BPM    Atrial Rate 82 BPM    P-R Interval 140 ms    QRS Duration 80 ms    Q-T Interval 392 ms    QTc Calculation (Bazett) 457 ms    P Axis 60 degrees    R Axis 26 degrees    T Axis 62 degrees    Diagnosis       Normal sinus rhythm  Low voltage QRS  Borderline ECG  When compared with ECG of 13-Mar-2023 19:14,  No significant change was found     CBC with Auto Differential    Collection Time: 08/08/23  3:00 AM   Result Value Ref Range    WBC 7.2 3.6 - 11.0 K/uL    RBC 4.09 3.80 - 5.20 M/uL    Hemoglobin 9.8 (L) 11.5 - 16.0 g/dL     Hematocrit 16.1 (L) 35.0 - 47.0 %    MCV 76.3 (L) 80.0 - 99.0 FL    MCH 24.0 (L) 26.0 - 34.0 PG    MCHC 31.4 30.0 - 36.5 g/dL    RDW 09.6 (H) 04.5 - 14.5 %    Platelets 241 150 - 400 K/uL    MPV 9.5 8.9 - 12.9 FL    Nucleated RBCs 0.0 0 PER 100 WBC    nRBC 0.00 0.00 - 0.01 K/uL    Neutrophils % 59.3 32.0 - 75.0 %    Lymphocytes % 27.8 12.0 - 49.0 %    Monocytes % 8.1 5.0 - 13.0 %    Eosinophils % 3.8 0.0 - 7.0 %    Basophils % 0.7 0.0 - 1.0 %    Immature Granulocytes % 0.3 0.0 - 0.5 %    Neutrophils Absolute 4.25 1.80 - 8.00 K/UL    Lymphocytes Absolute 1.99 0.80 - 3.50 K/UL    Monocytes Absolute 0.58 0.00 - 1.00 K/UL    Eosinophils Absolute 0.27 0.00 - 0.40 K/UL    Basophils Absolute 0.05 0.00 - 0.10 K/UL    Immature Granulocytes Absolute 0.02 0.00 - 0.04 K/UL    Differential Type AUTOMATED     Troponin    Collection Time: 08/08/23  3:00 AM   Result Value Ref Range    Troponin, High Sensitivity <4 0 - 51 ng/L   Comprehensive Metabolic Panel    Collection Time: 08/08/23  3:00 AM   Result Value Ref Range    Sodium 139 136 - 145 mmol/L    Potassium 4.1 3.5 - 5.1 mmol/L    Chloride 106 97 - 108 mmol/L    CO2 27 21 - 32 mmol/L    Anion Gap 6 2 - 12 mmol/L    Glucose 117 (H) 65 - 100 mg/dL    BUN 18 6 - 20 MG/DL    Creatinine 4.09 8.11 - 1.02 MG/DL    BUN/Creatinine Ratio 21 (H) 12 - 20  Est, Glom Filt Rate 75 >60 ml/min/1.28m2    Calcium 9.0 8.5 - 10.1 MG/DL    Total Bilirubin 0.2 0.2 - 1.0 MG/DL    ALT 21 12 - 78 U/L    AST 20 15 - 37 U/L    Alk Phosphatase 171 (H) 45 - 117 U/L    Total Protein 7.0 6.4 - 8.2 g/dL    Albumin 3.4 (L) 3.5 - 5.0 g/dL    Globulin 3.6 2.0 - 4.0 g/dL    Albumin/Globulin Ratio 0.9 (L) 1.1 - 2.2     Troponin    Collection Time: 08/08/23  5:41 AM   Result Value Ref Range    Troponin, High Sensitivity <4 0 - 51 ng/L       EKG: If performed, independent interpretation documented below in the MDM section     RADIOLOGY:  Non-plain film images such as CT, Ultrasound and MRI are read by the  radiologist. Plain radiographic images are visualized and preliminarily interpreted by the ED Provider with the findings documented in the MDM section.     Interpretation per the Radiologist below, if available at the time of this note:     CTA CHEST W WO CONTRAST   Final Result   No acute pulmonary embolus.         Electronically signed by Eual Fines           PROCEDURES   Unless otherwise noted below or in ED Course, none  Procedures     CRITICAL CARE TIME       EMERGENCY DEPARTMENT COURSE and DIFFERENTIAL DIAGNOSIS/MDM   Vitals:    Vitals:    08/08/23 0415 08/08/23 0445 08/08/23 0500 08/08/23 0545   BP:   98/69 101/71   Pulse: 67 67 69    Resp: 21 15 19     Temp:       TempSrc:       SpO2: 94% 97% 92% 94%   Weight:       Height:            Patient was given the following medications:  Medications   lidocaine 4 % external patch 1 patch (has no administration in time range)   oxyCODONE (ROXICODONE) immediate release tablet 5 mg (has no administration in time range)   acetaminophen (TYLENOL) tablet 1,000 mg (1,000 mg Oral Given 08/08/23 0334)   ketorolac (TORADOL) injection 15 mg (15 mg IntraVENous Given 08/08/23 0335)   iopamidol (ISOVUE-370) 76 % injection 100 mL (100 mLs IntraVENous Given 08/08/23 0436)       Medical Decision Making  68 year old prior PE not currently anticoagulated presenting with worsening chest pain shortness of breath following chest trauma approximately 12 days ago    Moderately high suspicion of pulmonary embolus, consider delayed pulmonary contusion, sternal fracture    Will obtain CTA, EKG troponin given her right-sided chest pain now rating down her arm to assess for ACS, BNP CBC CMP.  She is high 80s on room air, placed on 2 L nasal cannula with improved oxygenation to the mid to low 90s, breathing comfortably on my evaluation.    Amount and/or Complexity of Data Reviewed  Labs: ordered. Decision-making details documented in ED Course.  Radiology: ordered and independent interpretation  performed.  ECG/medicine tests: ordered and independent interpretation performed.     Details: EKG obtained interpreted by myself shows normal sinus at a rate of 82, normal axis and intervals, no STEMI    Risk  OTC drugs.  Prescription drug management.          ED Course as of 08/08/23 0626   [facility] Aug 08, 2023   0981 CTA shows no pulmonary embolus, no effusion, no pneumothorax, mild bilateral dependent atelectasis, there is no fracture [WB]   0554 Troponin is undetectable, should present relatively promptly following onset of chest pain, will trend [WB]   0625 Repeat troponin undetectable, plan discharge [WB]      ED Course User Index  [WB] Corky Crafts, MD       Heart Score:  History: 0  ECG: 0  Patient Age: 73  *Risk factors for Atherosclerotic disease: Coronary Artery Disease  Risk Factors: 2  Troponin: 0  Heart Score Total: 4    NIHSS:         FINAL IMPRESSION     1. Contusion of sternum, initial encounter    2. Acute chest pain          DISPOSITION/PLAN   Lindsay [name]'s  results have been reviewed with her.  She has been counseled regarding her diagnosis, treatment, and plan.  She verbally conveys understanding and agreement of the signs, symptoms, diagnosis, treatment and prognosis and additionally agrees to follow up as discussed.  She also agrees with the care-plan and conveys that all of her questions have been answered.  I have also provided discharge instructions for her that include: educational information regarding their diagnosis and treatment, and list of reasons why they would want to return to the ED prior to their follow-up appointment, should her condition change.     CLINICAL IMPRESSION    Discharge Note: The patient is stable for discharge home. The signs, symptoms, diagnosis, and discharge instructions have been discussed, understanding conveyed, and agreed upon. The patient is to follow up as recommended or return to ER should their symptoms worsen.      PATIENT REFERRED TO:  Clarene Critchley, MD  [address] [facility]  [address]  [place] [address]  [phone]    In 3 days      [facility]  [address]  [place] [address]  [phone]    If symptoms worsen, As needed    Radene Ou, MD  [address]  [address]  [place] [address]  [phone]             DISCHARGE MEDICATIONS:     Medication List        START taking these medications      lidocaine 4 % external patch  Place 1 patch onto the skin daily     meloxicam 7.5 MG tablet  Commonly known as: MOBIC  Take 1 tablet by mouth daily for 14 days     methylPREDNISolone 4 MG tablet  Commonly known as: MEDROL (PAK)  Take by mouth.     oxyCODONE 5 MG immediate release tablet  Commonly known as: Roxicodone  Take 1 tablet by mouth every 6 hours as needed for Pain for up to 3 days. Intended supply: 3 days. Take lowest dose possible to manage pain Max Daily Amount: 20 mg            ASK your doctor about these medications      acetaminophen 325 MG tablet  Commonly known as: TYLENOL     aspirin 81 MG chewable tablet     atorvastatin 10 MG tablet  Commonly known as: LIPITOR  TAKE 1 TABLET BY MOUTH EVERY DAY  buPROPion 150 MG extended release tablet  Commonly known as: WELLBUTRIN XL  TAKE 1 TABLET BY MOUTH EVERY MORNING     diazePAM 5 MG tablet  Commonly known as: VALIUM  TAKE 1 TABLET BY MOUTH EVERY 8 HOURS AS NEEDED FOR ANXIETY OR SLEEP     pregabalin 150 MG capsule  Commonly known as: LYRICA  Take 1 capsule by mouth 2 times daily for 180 days. Max Daily Amount: 300 mg     rOPINIRole 1 MG tablet  Commonly known as: REQUIP  TAKE 1 TABLET AT 1-2PM AND TAKE 3 TABLETS BY MOUTH 1 TO 3 HOURS BEFORE BEDTIME EVERYDAY     vitamin D3 125 MCG (5000 UT) Tabs tablet  Commonly known as: CHOLECALCIFEROL               Where to Get Your Medications        These medications were sent to [id number] - [place], [place] - [address] - P [phone] [address] [phone]  [phone], [place] [address]      Hours: 24-hours Phone: [phone]   lidocaine 4 % external patch  meloxicam 7.5 MG tablet  methylPREDNISolone 4 MG tablet  oxyCODONE 5 MG immediate release tablet           DISCONTINUED MEDICATIONS:  Current Discharge Medication List          I am the Primary Clinician of Record.   Corky Crafts, MD (electronically signed)    (Please note that parts of this dictation were completed with voice recognition software. Quite often unanticipated grammatical, syntax, homophones, and other interpretive errors are inadvertently transcribed by the computer software. Please disregards these errors. Please excuse any errors that have escaped final proofreading.)         Corky Crafts, MD  08/08/23 [phone]

## 2023-08-09 NOTE — Telephone Encounter (Signed)
Patient calling to speak with nurse regarding a GLF she had the 22nd of January - went to ED yesterday for this fall   Patient states she fell and hit her chest and is having ongoing pain - states she is laying down and is not feeling any better and does not know what else to do for the pain she is having as this has been on going for a while now

## 2023-08-10 ENCOUNTER — Encounter

## 2023-08-10 MED ORDER — DIAZEPAM 5 MG PO TABS
5 MG | ORAL_TABLET | ORAL | 0 refills | Status: DC
Start: 2023-08-10 — End: 2023-08-12

## 2023-08-10 NOTE — Telephone Encounter (Signed)
02/07 at 11 AM

## 2023-08-10 NOTE — Telephone Encounter (Signed)
Medication refill for    diazePAM (VALIUM) 5 MG tablet     [id number] - [place], [place] - [address] [place] - P [phone] - F [phone] [[id number]]

## 2023-08-10 NOTE — Telephone Encounter (Signed)
V/m left for patient notifying she needs to schedule an appt so she can be evaluated for her ongoing pain. Office number left for patient to call to schedule.

## 2023-08-11 ENCOUNTER — Encounter

## 2023-08-11 MED ORDER — PREGABALIN 150 MG PO CAPS
150 MG | ORAL_CAPSULE | Freq: Two times a day (BID) | ORAL | 2 refills | Status: DC
Start: 2023-08-11 — End: 2023-11-14

## 2023-08-12 ENCOUNTER — Encounter

## 2023-08-12 ENCOUNTER — Ambulatory Visit: Admit: 2023-08-12 | Payer: MEDICARE | Attending: Internal Medicine | Primary: Internal Medicine

## 2023-08-12 VITALS — BP 124/87 | HR 88 | Temp 97.70000°F | Resp 16 | Ht 63.0 in | Wt 196.0 lb

## 2023-08-12 DIAGNOSIS — R7301 Impaired fasting glucose: Secondary | ICD-10-CM

## 2023-08-12 MED ORDER — DIAZEPAM 5 MG PO TABS
5 | ORAL_TABLET | Freq: Two times a day (BID) | ORAL | 0 refills | Status: AC | PRN
Start: 2023-08-12 — End: 2023-10-11

## 2023-08-12 NOTE — Assessment & Plan Note (Signed)
Reports good control with Requip and Lyrica from neurology

## 2023-08-12 NOTE — Assessment & Plan Note (Signed)
Continues on atorvastatin 10 mg due for lipid    Orders:    Lipid Panel; Future

## 2023-08-12 NOTE — Progress Notes (Signed)
Lindsay [name] (DOB:  1956/06/24) is a 68 y.o. female,Established patient, here for evaluation of the following chief complaint(s):  Follow-up (Evaluated for ongoing pain; sternum)         Assessment & Plan  Anxiety  Currently using Valium 5 mg 3 times daily.  Discussed my concern that is increasing her fall risk and she has had several recent falls.  Contract today that she will drop to 2 a day.  Has also just established with a psychiatrist.  Cannot recall the name of the psychiatrist and I am requesting that she send me the name and also have this doctor take over on management of the Valium         Severe episode of recurrent major depressive disorder, without psychotic features (HCC)  Ongoing depressive symptoms.  No thoughts of harm but very unhappy with her daughter's situation as well as her overall health.  Psychiatry offered [employer] and she is thinking of starting it.  This is encouraged  Discussed importance of ongoing psychiatry care         Fall, initial encounter  Larey Seat in the bathroom with chest wall contusion endorses ongoing pain.  Not using lidocaine patches and this is encouraged.  She is concerned about the smell of the patch.  At the end of a Medrol Dosepak.         Dyslipidemia, goal LDL below 100  Continues on atorvastatin 10 mg due for lipid    Orders:    Lipid Panel; Future    RLS (restless legs syndrome)  Reports good control with Requip and Lyrica from neurology         Contusion of chest wall, unspecified laterality, initial encounter  No visible bruising but suspect contusion from recent fall         IFG (impaired fasting glucose)  Discussed recent elevated blood sugars.  Discussed steroids contribute to this.  Suggested A1c    Orders:    Hemoglobin A1C; Future      No follow-ups on file.       Subjective   HPI  Seen with multiple concerns.  She has had a chronic anemia microcytic.  Has colonoscopy scheduled [facility] on February 18.  Denies active bleeding.  Not currently  taking iron pills because of concern for constipation.  Encouraged to resume the iron and use Benefiber.    Has had multiple recent falls with ER visits.  Most recently landed on her chest wall and had a contusion of sternum.  Reports ongoing considerable pain there.  Using Tylenol.  Not using the lidocaine patches.  Encouraged to use the lidocaine.  Avoid narcotics.  Discussed risk for falls with benzodiazepines.  Discussed recommendation to decrease from 3 Valium a day to 2 a day.    Mood this continues to be a significant issue.  She feels concerned about what others are thinking of her based on daughter's current relationship.  Feels tired feels sad feels anxious did see a new psychiatrist once.  Cannot recall the psychiatrist name.  Would be willing to see her again.  Was apparently offered Trintellix but has not yet started it.  Currently is on bupropion.    Elevated blood sugars.  We discussed that I am concerned about early diabetes we will check an A1c.  She has not really been able to take care of herself in terms of diet and exercise  Review of Systems       Objective   Physical Exam  Constitutional:       Appearance: Normal appearance.   HENT:      Head: Normocephalic and atraumatic.   Cardiovascular:      Rate and Rhythm: Normal rate and regular rhythm.   Pulmonary:      Effort: Pulmonary effort is normal.      Breath sounds: Normal breath sounds.   Musculoskeletal:      Right lower leg: No edema.      Left lower leg: No edema.   Skin:     Findings: No bruising.   Neurological:      General: No focal deficit present.      Mental Status: She is alert and oriented to person, place, and time.   Psychiatric:      Comments: tearful              An electronic signature was used to authenticate this note.    --Golda Acre, MD

## 2023-08-12 NOTE — Assessment & Plan Note (Signed)
Currently using Valium 5 mg 3 times daily.  Discussed my concern that is increasing her fall risk and she has had several recent falls.  Contract today that she will drop to 2 a day.  Has also just established with a psychiatrist.  Cannot recall the name of the psychiatrist and I am requesting that she send me the name and also have this doctor take over on management of the Valium

## 2023-08-12 NOTE — Assessment & Plan Note (Addendum)
Ongoing depressive symptoms.  No thoughts of harm but very unhappy with her daughter's situation as well as her overall health.  Psychiatry offered [employer] and she is thinking of starting it.  This is encouraged  Discussed importance of ongoing psychiatry care

## 2023-08-13 LAB — CARDIOVASCULAR REPORT

## 2023-08-13 LAB — HEMOGLOBIN A1C: Hemoglobin A1C: 6.2 % — ABNORMAL HIGH (ref 4.8–5.6)

## 2023-08-13 LAB — LIPID PANEL
Cholesterol, Total: 241 mg/dL — ABNORMAL HIGH (ref 100–199)
HDL: 61 mg/dL (ref >39)
LDL Cholesterol: 124 mg/dL — ABNORMAL HIGH (ref 0–99)
Triglycerides: 322 mg/dL — ABNORMAL HIGH (ref 0–149)
VLDL Cholesterol Calculated: 56 mg/dL — ABNORMAL HIGH (ref 5–40)

## 2023-08-14 MED ORDER — ATORVASTATIN CALCIUM 20 MG PO TABS
20 | ORAL_TABLET | Freq: Every day | ORAL | 1 refills | Status: AC
Start: 2023-08-14 — End: ?

## 2023-08-23 ENCOUNTER — Inpatient Hospital Stay: Payer: MEDICARE | Attending: Student in an Organized Health Care Education/Training Program

## 2023-08-23 MED ORDER — PROPOFOL 100 MG/10ML IV EMUL
10010 | Freq: Once | INTRAVENOUS | Status: DC | PRN
Start: 2023-08-23 — End: 2023-08-23
  Administered 2023-08-23: 19:00:00 280 via INTRAVENOUS

## 2023-08-23 MED ORDER — SODIUM CHLORIDE 0.9 % IV SOLN
0.9 | INTRAVENOUS | Status: DC | PRN
Start: 2023-08-23 — End: 2023-08-23
  Administered 2023-08-23: 19:00:00 25 mL via INTRAVENOUS

## 2023-08-23 MED ORDER — NORMAL SALINE FLUSH 0.9 % IV SOLN
0.9 | Freq: Two times a day (BID) | INTRAVENOUS | Status: DC
Start: 2023-08-23 — End: 2023-08-23

## 2023-08-23 MED ORDER — NORMAL SALINE FLUSH 0.9 % IV SOLN
0.9 | INTRAVENOUS | Status: DC | PRN
Start: 2023-08-23 — End: 2023-08-23

## 2023-08-23 MED ORDER — LIDOCAINE HCL (PF) 2 % IJ SOLN
2 | INTRAMUSCULAR | Status: AC
Start: 2023-08-23 — End: ?

## 2023-08-23 MED ORDER — PROPOFOL 200 MG/20ML IV EMUL
20020 | INTRAVENOUS | Status: AC
Start: 2023-08-23 — End: ?

## 2023-08-23 MED ORDER — LIDOCAINE HCL (PF) 2 % IJ SOLN
2 | Freq: Once | INTRAMUSCULAR | Status: DC | PRN
Start: 2023-08-23 — End: 2023-08-23
  Administered 2023-08-23: 19:00:00 100 via INTRAVENOUS

## 2023-08-23 MED FILL — PROPOFOL 200 MG/20ML IV EMUL: 20020 MG/20ML | INTRAVENOUS | Qty: 40

## 2023-08-23 MED FILL — XYLOCAINE-MPF 2 % IJ SOLN: 2 % | INTRAMUSCULAR | Qty: 10

## 2023-08-23 MED FILL — PROPOFOL 200 MG/20ML IV EMUL: 20020 MG/20ML | INTRAVENOUS | Qty: 20

## 2023-08-23 NOTE — Anesthesia Pre-Procedure Evaluation (Addendum)
Department of Anesthesiology  Preprocedure Note       Name:  Lindsay [name]   Age:  68 y.o.  DOB:  07/04/1956                                          MRN:  [id number]         Date:  08/23/2023      Surgeon: Moishe Spice):  Araceli Bouche, MD    Procedure: Procedure(s):  COLONOSCOPY, ESOPHAGOGASTRODUODENOSCOPY WITH BIOPSY  ESOPHAGOGASTRODUODENOSCOPY BIOPSY    Medications prior to admission:   Prior to Admission medications    Medication Sig Start Date End Date Taking? Authorizing Provider   atorvastatin (LIPITOR) 20 MG tablet Take 1 tablet by mouth daily 08/14/23   Clarene Critchley, MD   diazePAM (VALIUM) 5 MG tablet Take 1 tablet by mouth every 12 hours as needed for Anxiety for up to 60 days. Max Daily Amount: 10 mg 08/12/23 10/11/23  Clarene Critchley, MD   pregabalin (LYRICA) 150 MG capsule Take 1 capsule by mouth 2 times daily for 180 days. Max Daily Amount: 300 mg 08/11/23 02/07/24  Lance Bosch, MD   lidocaine 4 % external patch Place 1 patch onto the skin daily  Patient not taking: Reported on 08/12/2023 08/08/23 09/07/23  Corky Crafts, MD   rOPINIRole (REQUIP) 1 MG tablet TAKE 1 TABLET AT 1-2PM AND TAKE 3 TABLETS BY MOUTH 1 TO 3 HOURS BEFORE BEDTIME EVERYDAY 05/30/23   Lance Bosch, MD   buPROPion (WELLBUTRIN XL) 150 MG extended release tablet TAKE 1 TABLET BY MOUTH EVERY MORNING 05/16/23   Clarene Critchley, MD   acetaminophen (TYLENOL) 325 MG tablet Take by mouth every 4 hours as needed    Automatic Reconciliation, Ar   aspirin 81 MG chewable tablet Take by mouth daily 03/27/16   Automatic Reconciliation, Ar   vitamin D3 (CHOLECALCIFEROL) 125 MCG (5000 UT) TABS tablet TAKE 1 TABLET BY MOUTH DAILY 01/09/20   Automatic Reconciliation, Ar       Current medications:    No current facility-administered medications for this encounter.       Allergies:  No Known Allergies    Problem List:    Patient Active Problem List   Diagnosis Code    Chest pain R07.9    Closed supracondylar fracture of humerus S42.413A    RLS  (restless legs syndrome) G25.81    Atelectasis J98.11    Back pain M54.9    Abnormal brain scan R94.02    Depression F32.A    Anxiety F41.9    Primary osteoarthritis of right knee M17.11    Paresthesia of lower lip R20.2    Dyslipidemia, goal LDL below 100 E78.5    Primary osteoarthritis of left knee M17.12    Compression fracture of body of thoracic vertebra (HCC) S22.000A    Recurrent depression (HCC) F33.9    Recurrent pulmonary embolism (HCC) I26.99    History of spinal fusion Z98.1    Severe episode of recurrent major depressive disorder, without psychotic features (HCC) F33.2       Past Medical History:        Diagnosis Date    Abnormal brain scan     Anxiety disorder     Arthritis     Chronic pain     Depression     DVT (deep venous thrombosis) (HCC)  Falls frequently     Fracture of left ankle     Headache(784.0) 2011    Ill-defined condition 09/15/2015    FX R HAND    Neurocutaneous syndrome (HCC)     Obesity Mar. '23 - Aug. '24    Other and unspecified hyperlipidemia 12/27/2012    PE (pulmonary embolism) 12/2012    RLS (restless legs syndrome) 03/16/2016    Dr Scarlett Presto    TIA (transient ischemic attack) 03/2016    Right facial droop, numbness in lip    Torn rotator cuff     right       Past Surgical History:        Procedure Laterality Date    APPENDECTOMY      BREAST SURGERY  2002    AUGMENTATION    CESAREAN SECTION      x4    GI      COLONOSCOPY    IMPLANT BREAST SILICONE/EQ  2002    Bilateral breast implants    IR KYPHOPLASTY THORACIC 1 VERTEBRAL BODY  12/12/2019    IR KYPHOPLASTY THORACIC FIRST LEVEL 12/12/2019 [facility]    IR KYPHOPLASTY THORACIC 1 VERTEBRAL BODY  12/12/2019    JOINT REPLACEMENT  2016 knees    LUMBAR FUSION  06/02/2021    ORTHOPEDIC SURGERY  08/2004    rotater cuff r. shoulder    ORTHOPEDIC SURGERY      left ankle fracture treated with air cast    ORTHOPEDIC SURGERY  12/2011    left shoulder fxt and dislocation    OTHER SURGICAL HISTORY  2010    left foot torn tendon    TOTAL  KNEE ARTHROPLASTY Bilateral 10/29/2015    TUBAL LIGATION         Social History:    Social History     Tobacco Use    Smoking status: Never     Passive exposure: Past    Smokeless tobacco: Never   Substance Use Topics    Alcohol use: No                                Counseling given: Not Answered      Vital Signs (Current): There were no vitals filed for this visit.                                           BP Readings from Last 3 Encounters:   08/12/23 124/87   08/08/23 (!) 105/59   03/29/23 116/74       NPO Status: Time of last liquid consumption: 0830                        Time of last solid consumption: 1900                        Date of last liquid consumption: 08/23/23                        Date of last solid food consumption: 08/21/23    BMI:   Wt Readings from Last 3 Encounters:   08/12/23 88.9 kg (196 lb)   08/08/23 89.3 kg (196 lb 13.9 oz)   07/28/23 79.4 kg (175 lb)  There is no height or weight on file to calculate BMI.    CBC:   Lab Results   Component Value Date/Time    WBC 7.2 08/08/2023 03:00 AM    RBC 4.09 08/08/2023 03:00 AM    HGB 9.8 08/08/2023 03:00 AM    HCT 31.2 08/08/2023 03:00 AM    MCV 76.3 08/08/2023 03:00 AM    RDW 19.9 08/08/2023 03:00 AM    PLT 241 08/08/2023 03:00 AM       CMP:   Lab Results   Component Value Date/Time    NA 139 08/08/2023 03:00 AM    K 4.1 08/08/2023 03:00 AM    CL 106 08/08/2023 03:00 AM    CO2 27 08/08/2023 03:00 AM    BUN 18 08/08/2023 03:00 AM    CREATININE 0.84 08/08/2023 03:00 AM    GFRAA >60 12/12/2019 04:16 AM    AGRATIO 0.7 06/06/2021 02:50 AM    LABGLOM 75 08/08/2023 03:00 AM    LABGLOM 48 05/02/2022 10:26 PM    LABGLOM >60 06/06/2021 02:50 AM    GLUCOSE 117 08/08/2023 03:00 AM    CALCIUM 9.0 08/08/2023 03:00 AM    BILITOT 0.2 08/08/2023 03:00 AM    ALKPHOS 171 08/08/2023 03:00 AM    ALKPHOS 98 06/06/2021 02:50 AM    AST 20 08/08/2023 03:00 AM    ALT 21 08/08/2023 03:00 AM       POC Tests: No results for input(s): "POCGLU", "POCNA", "POCK", "POCCL",  "POCBUN", "POCHEMO", "POCHCT" in the last 72 hours.    Coags:   Lab Results   Component Value Date/Time    PROTIME 9.5 05/26/2021 11:22 AM    INR 0.9 05/26/2021 11:22 AM       HCG (If Applicable): No results found for: "PREGTESTUR", "PREGSERUM", "HCG", "HCGQUANT"     ABGs: No results found for: "PHART", "PO2ART", "PCO2ART", "HCO3ART", "BEART", "O2SATART"     Type & Screen (If Applicable):  Lab Results   Component Value Date    ABORH AB POSITIVE 05/26/2021    LABANTI POS 05/26/2021       Drug/Infectious Status (If Applicable):  Lab Results   Component Value Date/Time    HEPCAB <0.1 11/06/2014 03:35 PM       COVID-19 Screening (If Applicable): No results found for: "COVID19"        Anesthesia Evaluation  Patient summary reviewed and Nursing notes reviewed  Airway: Mallampati: II       Mouth opening: > = 3 FB   Dental:    (+) caps  Comment: Side and back crowns, none in front    Pulmonary:Negative Pulmonary ROS and normal exam    (+)   shortness of breath: chronic,     decreased breath sounds: bilateral                              ROS comment: Hx of post-op PEs   Cardiovascular:Negative CV ROS  Exercise tolerance: poor (<4 METS)          Rhythm: regular  Rate: normal                    Neuro/Psych:   Negative Neuro/Psych ROS  (+) TIA, headaches: migraine headaches, psychiatric history:            GI/Hepatic/Renal:   (+) morbid obesity         ROS comment: Fe defic anemia.   Endo/Other:  Negative Endo/Other ROS                    Abdominal:             Vascular: negative vascular ROS.  + PE.       Other Findings:           Anesthesia Plan      MAC     ASA 3       Induction: intravenous.      Anesthetic plan and risks discussed with patient.      Plan discussed with CRNA and attending.    Attending anesthesiologist reviewed and agrees with Preprocedure content              Jeanell Sparrow, MD   08/23/2023

## 2023-08-23 NOTE — H&P (Signed)
 Gastroenterology History and Physical    Patient: Lindsay [name]    Physician: Araceli Bouche, MD    Vital Signs: Blood pressure 138/83, pulse 94, resp. rate 17, height 1.626 m (5\' 4" ), weight 87.1 kg (192 lb), SpO2 90%.    Allergies: No Known Allergies    Procedure: endoscopy and colonoscopy    Indication: Iron deficiency anemia    History:  Past Medical History:   Diagnosis Date    Abnormal brain scan     Anxiety disorder     Arthritis     Chronic pain     Depression     DVT (deep venous thrombosis) (HCC)     Falls frequently     Fracture of left ankle     Headache(784.0) 2011    Ill-defined condition 09/15/2015    FX R HAND    Neurocutaneous syndrome (HCC)     Obesity Mar. '23 - Aug. '24    Other and unspecified hyperlipidemia 12/27/2012    PE (pulmonary embolism) 12/2012    RLS (restless legs syndrome) 03/16/2016    Dr Scarlett Presto    TIA (transient ischemic attack) 03/2016    Right facial droop, numbness in lip    Torn rotator cuff     right      Past Surgical History:   Procedure Laterality Date    APPENDECTOMY      BREAST SURGERY  2002    AUGMENTATION    CESAREAN SECTION      x4    GI      COLONOSCOPY    IMPLANT BREAST SILICONE/EQ  2002    Bilateral breast implants    IR KYPHOPLASTY THORACIC 1 VERTEBRAL BODY  12/12/2019    IR KYPHOPLASTY THORACIC FIRST LEVEL 12/12/2019 [facility]    IR KYPHOPLASTY THORACIC 1 VERTEBRAL BODY  12/12/2019    JOINT REPLACEMENT  2016 knees    LUMBAR FUSION  06/02/2021    ORTHOPEDIC SURGERY  08/2004    rotater cuff r. shoulder    ORTHOPEDIC SURGERY      left ankle fracture treated with air cast    ORTHOPEDIC SURGERY  12/2011    left shoulder fxt and dislocation    OTHER SURGICAL HISTORY  2010    left foot torn tendon    TOTAL KNEE ARTHROPLASTY Bilateral 10/29/2015    TUBAL LIGATION        Social History     Socioeconomic History    Marital status: Married     Spouse name: None    Number of children: None    Years of education: None    Highest education level: None   Tobacco Use     Smoking status: Never     Passive exposure: Past    Smokeless tobacco: Never   Vaping Use    Vaping status: Never Used    Passive vaping exposure: Yes   Substance and Sexual Activity    Alcohol use: No    Drug use: No     Social Determinants of Health     Financial Resource Strain: Low Risk  (02/23/2023)    Overall Financial Resource Strain (CARDIA)     Difficulty of Paying Living Expenses: Not hard at all   Food Insecurity: No Food Insecurity (08/12/2023)    Hunger Vital Sign     Worried About Running Out of Food in the Last Year: Never true     Ran Out of Food in the Last Year: Never true  Transportation Needs: No Transportation Needs (08/12/2023)    PRAPARE - Therapist, art (Medical): No     Lack of Transportation (Non-Medical): No   Physical Activity: Sufficiently Active (02/23/2023)    Exercise Vital Sign     Days of Exercise per Week: 5 days     Minutes of Exercise per Session: 40 min   Housing Stability: Low Risk  (08/12/2023)    Housing Stability Vital Sign     Unable to Pay for Housing in the Last Year: No     Number of Times Moved in the Last Year: 0     Homeless in the Last Year: No      Family History   Problem Relation Age of Onset    No Known Problems Son     No Known Problems Daughter     No Known Problems Daughter     Heart Disease Mother     Other Brother         Covid    Rheum Arthritis Sister     Cancer Father         thyroid cancer    Heart Disease Father     No Known Problems Daughter     Anesth Problems Neg Hx        Medications:   Prior to Admission medications    Medication Sig Start Date End Date Taking? Authorizing Provider   atorvastatin (LIPITOR) 20 MG tablet Take 1 tablet by mouth daily 08/14/23  Yes Clarene Critchley, MD   diazePAM (VALIUM) 5 MG tablet Take 1 tablet by mouth every 12 hours as needed for Anxiety for up to 60 days. Max Daily Amount: 10 mg 08/12/23 10/11/23 Yes Clarene Critchley, MD   pregabalin (LYRICA) 150 MG capsule Take 1 capsule by mouth 2 times  daily for 180 days. Max Daily Amount: 300 mg 08/11/23 02/07/24 Yes Sangha, Lucien Mons, MD   rOPINIRole (REQUIP) 1 MG tablet TAKE 1 TABLET AT 1-2PM AND TAKE 3 TABLETS BY MOUTH 1 TO 3 HOURS BEFORE BEDTIME EVERYDAY 05/30/23  Yes Sangha, Lucien Mons, MD   buPROPion (WELLBUTRIN XL) 150 MG extended release tablet TAKE 1 TABLET BY MOUTH EVERY MORNING 05/16/23  Yes Clarene Critchley, MD   aspirin 81 MG chewable tablet Take by mouth daily 03/27/16  Yes Automatic Reconciliation, Ar   vitamin D3 (CHOLECALCIFEROL) 125 MCG (5000 UT) TABS tablet TAKE 1 TABLET BY MOUTH DAILY 01/09/20  Yes Automatic Reconciliation, Ar   lidocaine 4 % external patch Place 1 patch onto the skin daily  Patient not taking: Reported on 08/12/2023 08/08/23 09/07/23  Corky Crafts, MD   acetaminophen (TYLENOL) 325 MG tablet Take by mouth every 4 hours as needed    Automatic Reconciliation, Ar       Physical Exam:   HEENT: Head: Normocephalic, no lesions, without obvious abnormality.    Heart: regular rate and rhythm, S1, S2 normal   Lungs: chest clear, no wheezing, rales, normal symmetric air entry   Abdominal:  abdomen is soft, nontender, nondistended       Signed:  Araceli Bouche, MD 08/23/2023

## 2023-08-23 NOTE — Progress Notes (Signed)
Endoscope was pre-cleaned at bedside immediately following procedure by Edy sosa, ET.

## 2023-08-23 NOTE — Discharge Instructions (Addendum)
 SHARRON PETRUSKA  [id number]  [birth year]    EGD/COLONOSCOPY DISCHARGE INSTRUCTIONS    CALL M.D. WITH ANY SIGN OF   Increasing pain, nausea, vomiting  Abdominal distension (swelling)  New increased bleeding (oral or rectal)  Fever (chills)  Pain in chest area  Bloody discharge from nose or mouth  Shortness of breath    For 24 hours after general anesthesia or intravenous analgesia / sedation  you may experience:  Drowsiness, dizziness, sleepiness, or confusion  Difficulty remembering or delayed reaction times  Difficulty with your balance, especially while walking, move slowly and carefully, do not make sudden position changes  Difficulty focusing or blurred vision    Discomfort:  Sore throat- throat lozenges or warm salt water gargle  redness at IV site- apply warm compress to area; if redness or soreness persist- contact your physician  Gaseous discomfort- walking, belching will help relieve any discomfort  You may not operate a vehicle for 12 hours  You may not engage in an occupation involving machinery or appliances for rest of today  You may not drink alcoholic beverages for at least 12 hours  Avoid making any critical decisions for at least 24 hour    DIET  You may have minimal sips at this time-- do not eat or drink for two hours.  You may eat and drink after your recovery  You may resume your regular diet - however -  remember your stomach is empty and a heavy meal will produce gas.   Avoid these foods:  vegetables, fried / greasy foods, carbonated drinks    MEDICATIONS:  You may not take any Advil, Aspirin, Ibuprofen, Motrin, Aleve, or Goody's for 10 days, ONLY  Tylenol as needed for pain.    BLOOD THINNERS:    ACTIVITY  You may resume your normal daily activities until tomorrow AM;  Spend the remainder of the day resting -  avoid any strenuous activity.    IMPRESSION:  Biopsies were taken in your small intestine and your stomach  You have some small reflux changes in your esophagus - please let me know if  you develop heartburn or reflux symptoms  You had 1 small polyp removed during your colonoscopy  There are no signs of sources of your iron deficiency anemia - we will schedule a video capsule endoscopy (pill camera) to be done at our office    Follow-up Instructions:  Results of any biopsies (if taken) will typically be available in about 1 week.  We will attempt to notify you of any biopsy results.  Please call Dr. Orvan Falconer for results in 7-10 days if we have not notified you sooner.  Please call Dr. Orvan Falconer with other questions about the results of your procedure  Telephone [phone]  Repeat colonoscopy in 5 years    Araceli Bouche, MD    Patient Education on Sedation / Analgesia Administered for Procedure      For 24 hours after general anesthesia or intravenous analgesia / sedation:  Have someone responsible help you with your care  Limit your activities  Do not drive and operate hazardous machinery  Do not make important personal, legal or business decisions  Do not drink alcoholic beverages  If you have not urinated within 8 hours after discharge, please contact your physician  Resume your medications unless otherwise instructed    You may not be aware of slight changes in your behavior and/or your reaction time because of the medication used during and after your procedure.  Report the following to your physician:  Excessive pain, swelling, redness or odor of or around the surgical area  Temperature over 100.5  Nausea and vomiting lasting longer than 4 hours or if unable to take medications  Any signs of decreased circulation or nerve impairment to extremity: change in color, persistent numbness, tingling, coldness or increase pain  Any questions or concerns    IF YOU REPORT TO AN EMERGENCY ROOM, DOCTOR'S OFFICE OR HOSPITAL WITHIN 24 HOURS AFTER YOUR PROCEDURE, BRING THIS SHEET AND YOUR AFTER VISIT SUMMARY WITH YOU AND GIVE IT TO THE PHYSICIAN OR NURSE ATTENDING YOU.

## 2023-08-23 NOTE — Op Note (Signed)
 [facility]                  Colonoscopy Operative Report    08/23/2023      Lindsay [name]  [id number]  April 01, 1956    Procedure Type:   Colonoscopy with polypectomy (cold snare)     Indications:    Iron deficiency anemia     Pre-operative Diagnosis: see indication above    Post-operative Diagnosis:  See findings below    Operator:  Araceli Bouche, MD    Referring Provider: Clarene Critchley, MD      Sedation:  MAC anesthesia Propofol    Pre-Procedural Exam:      Airway: clear,  No airway problems anticipated  Heart: RRR, without gallops or rubs  Lungs: clear bilaterally without wheezes, crackles, or rhonchi  Abdomen: soft, nontender, nondistended, bowel sounds present  Mental Status: awake, alert and oriented to person, place and time     Procedure Details:  After informed consent was obtained with all risks and benefits of procedure explained and preoperative exam completed, the patient was taken to the endoscopy suite and placed in the left lateral decubitus position.  Upon sequential sedation as per above, a digital rectal exam was performed .  The Olympus videocolonoscope  was inserted in the rectum and carefully advanced to the terminal ileum.  The cecum was identified by the ileocecal valve and appendiceal orifice.  The quality of preparation was good BBPS=9.  The colonoscope was slowly withdrawn with careful evaluation between folds. Retroflexion in the rectum was completed demonstrating internal hemorrhoids.     Findings:   Rectum: small grade I internal hemorrhoids and anal papillae noted  Sigmoid: sessile 5mm polyp removed with cold snare  Descending Colon: normal  Transverse Colon: normal  Ascending Colon: normal  Cecum: normal  Terminal Ileum: normal      Specimen Removed:   Jar 3) sigmoid colon polyp    Complications: None.     EBL:  None.    Impression:     1 sigmoid colon polyp removed  Small hemorrhoids  Otherwise normal colonoscopy      Recommendations: --Await  pathology  -Likely repeat colonoscopy in 5 years  -We will proceed w/ video capsule endoscopy  - High fiber diet.    - Resume normal medication(s).   - Continue iron supplementation    Discharge Disposition:  Home in the company of a driver when able to ambulate.    Araceli Bouche, MD    08/23/2023     [place], MD  [facility]  [address]  [place], [place] [address]  [phone]  [email or web address]

## 2023-08-23 NOTE — Progress Notes (Signed)
 The risks and benefits of the bite block have been explained to patient.  Patient verbalizes understanding.

## 2023-08-23 NOTE — Progress Notes (Signed)
 Endoscopy recovery  Patient returned to baseline, vital signs stable (see vital sign flowsheet). Patient offered liquids and tolerated well. Respiratory status within defined limits. Abdomen soft not tender. Skin with in defined limits. Responsible party driving patient home was given the opportunity to ask questions. Patient discharged with documented belongings.

## 2023-08-23 NOTE — Anesthesia Post-Procedure Evaluation (Signed)
Department of Anesthesiology  Postprocedure Note    Patient: Lindsay [name]  MRN: [id number]  Birthdate: Apr 11, 1956  Date of evaluation: 08/23/2023    Procedure Summary       Date: 08/23/23 Room / Location: [facility] / [facility]    Anesthesia Start: 1347 Anesthesia Stop: 1417    Procedures:       COLONOSCOPY, ESOPHAGOGASTRODUODENOSCOPY WITH BIOPSY      ESOPHAGOGASTRODUODENOSCOPY BIOPSY Diagnosis:       Iron deficiency anemia, unspecified iron deficiency anemia type      (Iron deficiency anemia, unspecified iron deficiency anemia type [D50.9])    Surgeons: Araceli Bouche, MD Responsible Provider: Baker Janus, DO    Anesthesia Type: MAC ASA Status: 3            Anesthesia Type: MAC    Aldrete Phase I: Aldrete Score: 10    Aldrete Phase II: Aldrete Score: 10    Anesthesia Post Evaluation    Patient location during evaluation: PACU  Patient participation: complete - patient participated  Level of consciousness: awake  Airway patency: patent  Nausea & Vomiting: no vomiting and no nausea  Cardiovascular status: hemodynamically stable  Respiratory status: acceptable  Hydration status: euvolemic    No notable events documented.

## 2023-08-23 NOTE — Op Note (Signed)
 [name]                   Endoscopic Gastroduodenoscopy Procedure Note      08/23/2023  Lindsay [name]  07/04/1956  [id number]    Procedure: Endoscopic Gastroduodenoscopy with biopsy    Indication: Iron deficiency anemia     Pre-operative Diagnosis: see indication above    Post-operative Diagnosis: see findings below    Operator: Araceli Bouche, MD    Referring Provider:  Clarene Critchley, MD      Anesthesia/Sedation:  MAC anesthesia Propofol    Airway assessment: No airway problems anticipated    Pre-Procedural Exam:      Airway: clear, no airway problems anticipated  Heart: RRR, without gallops or rubs  Lungs: clear bilaterally without wheezes, crackles, or rhonchi  Abdomen: soft, nontender, nondistended, bowel sounds present  Mental Status: awake, alert and oriented to person, place and time       Procedure Details     After infomed consent was obtained for the procedure, with all risks and benefits of procedure explained the patient was taken to the endoscopy suite and placed in the left lateral decubitus position.  Following sequential administration of sedation as per above, the endoscope was inserted into the mouth and advanced under direct vision to second portion of the duodenum.  A careful inspection was made as the gastroscope was withdrawn, including a retroflexed view of the proximal stomach; findings and interventions are described below.      Findings:   Esophagus:normal mucosa; mild reflux changes noted at the GE junction at 40 cm  Stomach: normal gastric mucosa; biopsies were obtained to assess for H pylori  Duodenum: normal duodenal mucosa, biopsied for sprue.  The ampulla was visualized and was normal in appearance    Therapies:  biopsies    Specimens: Jar 1) duodenal  biopsies  Jar 2) gastric biopsies           Complications:   None; patient tolerated the procedure well.    EBL:  None.            Impression:      -Normal upper endoscopy, with no endoscopic  evidence of neoplasia or source of iron deficiency  - Mild reflux esophagitis    Recommendations:  -Await pathology  -See colonoscopy report for additional recommendations      Araceli Bouche, MD  08/23/2023      Meribeth Mattes, MD  [facility]  [address]  [place], [place] [address]  [phone]  [email or web address]

## 2023-08-26 ENCOUNTER — Ambulatory Visit: Admit: 2023-08-26 | Discharge: 2023-09-01 | Payer: MEDICARE | Attending: Internal Medicine | Primary: Internal Medicine

## 2023-08-26 VITALS — BP 134/82 | HR 79 | Temp 97.70000°F | Resp 16 | Ht 64.0 in | Wt 190.0 lb

## 2023-08-26 DIAGNOSIS — W19XXXD Unspecified fall, subsequent encounter: Secondary | ICD-10-CM

## 2023-08-26 DIAGNOSIS — S20211S Contusion of right front wall of thorax, sequela: Secondary | ICD-10-CM

## 2023-08-26 MED ORDER — DICLOFENAC SODIUM 75 MG PO TBEC
75 | ORAL_TABLET | Freq: Two times a day (BID) | ORAL | 0 refills | Status: AC | PRN
Start: 2023-08-26 — End: ?

## 2023-08-26 NOTE — Assessment & Plan Note (Signed)
 Ongoing issue-she is going to follow up with psychiatry and is trying to decrease valium daily use

## 2023-08-26 NOTE — Progress Notes (Signed)
 Lindsay [name] (DOB:  1956/02/13) is a 68 y.o. female,Established patient, here for evaluation of the following chief complaint(s):  Fall Larey Seat on 1/22 bruised her sternum; per patient sternum feels better but has pain in her right breast under her arm )         Assessment & Plan  Fall, subsequent encounter   1 mo ago fell on sternum and ongoing pain now localized to right breast with radiation to right axilla  I can't feel any masses or lumps but wonder if the breast implant is damaged?  Refer to breast surgery  Ordered nsaid for bid use for the next 3 weeks   Take with food           Anxiety   Ongoing issue-she is going to follow up with psychiatry and is trying to decrease valium daily use         Contusion of chest wall, right, sequela   Take nsaid with food  Cont lyrica bid     Orders:  .  diclofenac (VOLTAREN) 75 MG EC tablet; Take 1 tablet by mouth 2 times daily as needed for Pain    Breast pain, right       Orders:  .  [facility] - Olive Bass, MD, Breast Surgery, [place]      No follow-ups on file.       Subjective   Fall  She fell in the bathroom on January 22 landing forward on her sternal wall.  She has been having pain ever since then her anterior chest but now it is more focal in the right breast with some radiation to the right axilla.  She has had breast implants in the past.  She is not feeling any lumps.  Notes there is been a small amount of discharge from her right nipple.  Has not had fevers or chills.  Does have discomfort with deep breathing.  This has not worsened.  Has been using lidocaine patch and pregabalin chronically for pain.  Notes her pain is currently a 5 out of 10.  She is not short of breath.  We have discussed ongoing fall risk with the Valium and she is trying to decrease to 2 a day rather than 3 a day and will be working with psychiatry to help manage her significant anxiety.  Reports recent colonoscopy and had 1 polyp resected  Review of Systems       Objective   Physical  Exam  Constitutional:       Appearance: Normal appearance.   HENT:      Head: Normocephalic and atraumatic.   Cardiovascular:      Rate and Rhythm: Normal rate and regular rhythm.   Pulmonary:      Effort: Pulmonary effort is normal.      Breath sounds: Normal breath sounds.   Genitourinary:     Comments: Right breast implant intact  No masses in axilla palpable  Tender over costochondral junctions right chest wall  Musculoskeletal:      Right lower leg: No edema.      Left lower leg: No edema.   Skin:     Findings: No bruising, erythema or rash.   Neurological:      General: No focal deficit present.      Mental Status: She is alert and oriented to person, place, and time.   Psychiatric:         Behavior: Behavior normal.  An electronic signature was used to authenticate this note.    --Golda Acre, MD

## 2023-08-26 NOTE — Telephone Encounter (Signed)
 Patient would like a call to discuss her fall 07/27/2023 has pain in right breat and underarm. Please  call to discuss options. Patient has been applying heat . PSR scheduled appointment. No call needed

## 2023-08-27 ENCOUNTER — Encounter

## 2023-08-28 MED ORDER — BUPROPION HCL ER (XL) 150 MG PO TB24
150 | ORAL_TABLET | Freq: Every morning | ORAL | 1 refills | 30.00000 days | Status: DC
Start: 2023-08-28 — End: 2024-06-01

## 2023-08-29 ENCOUNTER — Encounter: Payer: MEDICARE | Attending: Internal Medicine | Primary: Internal Medicine

## 2023-09-02 ENCOUNTER — Ambulatory Visit: Admit: 2023-09-02 | Discharge: 2023-09-02 | Payer: MEDICARE | Attending: Surgery | Primary: Internal Medicine

## 2023-09-02 VITALS — Ht 63.5 in | Wt 190.0 lb

## 2023-09-02 DIAGNOSIS — N6452 Nipple discharge: Secondary | ICD-10-CM

## 2023-09-02 NOTE — Progress Notes (Signed)
 HISTORY OF PRESENT ILLNESS  Lindsay [name] is a 68 y.o. female     HPI NEW patient consult referred for RIGHT breast pain after trauma to the area. Fell at the beginning of February and has been having pain in the breast since then. Has been improving when she sleeps in her bra. Had noticed bilateral nipple discharge that is random and clear in color.      Bilateral breast implants       Family History:  Niece- ovarian cancer     Breast imaging-   Mammogram 03/18/21    This is a summary report. The complete report is available in the patient's medical record. If you cannot access the medical record, please contact the sending organization for a detailed fax or copy.     STUDY: Bilateral digital screening mammogram     INDICATION:  Screening.     COMPARISON: Prior mammograms 2018 and 2013     BREAST COMPOSITION:  The breasts are heterogeneously dense, which may obscure  small masses.     FINDINGS: Bilateral digital screening mammography was performed and is  interpreted in conjunction with a computer assisted detection (CAD) system.  There are bilateral prepectoral saline implants in place. Images were obtained  with implants in place and displaced. No suspicious masses or calcifications are  identified. There has been no significant change.     IMPRESSION:  BI-RADS 1: Negative. No mammographic evidence of malignancy.     RECOMMENDATIONS:  Next screening mammogram is recommended in one year.     Past Medical History:   Diagnosis Date    Abnormal brain scan     Anxiety disorder     Arthritis     Chronic pain     Depression     DVT (deep venous thrombosis) (HCC)     Falls frequently     Fracture of left ankle     Headache(784.0) 2011    Ill-defined condition 09/15/2015    FX R HAND    Neurocutaneous syndrome (HCC)     Obesity Mar. '23 - Aug. '24    Other and unspecified hyperlipidemia 12/27/2012    PE (pulmonary embolism) 12/2012    RLS (restless legs syndrome) 03/16/2016    Dr Scarlett Presto    TIA (transient ischemic  attack) 03/2016    Right facial droop, numbness in lip    Torn rotator cuff     right     Past Surgical History:   Procedure Laterality Date    APPENDECTOMY      BREAST SURGERY  2002    AUGMENTATION    CESAREAN SECTION      x4    COLONOSCOPY N/A 08/23/2023    COLONOSCOPY, ESOPHAGOGASTRODUODENOSCOPY WITH BIOPSY performed by Araceli Bouche, MD at [facility]    GI      COLONOSCOPY    IMPLANT BREAST SILICONE/EQ  2002    Bilateral breast implants    IR KYPHOPLASTY THORACIC 1 VERTEBRAL BODY  12/12/2019    IR KYPHOPLASTY THORACIC FIRST LEVEL 12/12/2019 [facility]    IR KYPHOPLASTY THORACIC 1 VERTEBRAL BODY  12/12/2019    JOINT REPLACEMENT  2016 knees    LUMBAR FUSION  06/02/2021    ORTHOPEDIC SURGERY  08/2004    rotater cuff r. shoulder    ORTHOPEDIC SURGERY      left ankle fracture treated with air cast    ORTHOPEDIC SURGERY  12/2011    left shoulder fxt and dislocation  OTHER SURGICAL HISTORY  2010    left foot torn tendon    TOTAL KNEE ARTHROPLASTY Bilateral 10/29/2015    TUBAL LIGATION      UPPER GASTROINTESTINAL ENDOSCOPY N/A 08/23/2023    ESOPHAGOGASTRODUODENOSCOPY BIOPSY performed by Araceli Bouche, MD at [facility]     Social History     Socioeconomic History    Marital status: Married     Spouse name: Not on file    Number of children: Not on file    Years of education: Not on file    Highest education level: Not on file   Occupational History    Not on file   Tobacco Use    Smoking status: Never     Passive exposure: Past    Smokeless tobacco: Never   Vaping Use    Vaping status: Never Used    Passive vaping exposure: Yes   Substance and Sexual Activity    Alcohol use: No    Drug use: No    Sexual activity: Not on file   Other Topics Concern    Not on file   Social History Narrative    Not on file     Social Determinants of Health     Financial Resource Strain: Low Risk  (02/23/2023)    Overall Financial Resource Strain (CARDIA)     Difficulty of Paying Living Expenses: Not hard at all   Food  Insecurity: No Food Insecurity (08/12/2023)    Hunger Vital Sign     Worried About Running Out of Food in the Last Year: Never true     Ran Out of Food in the Last Year: Never true   Transportation Needs: No Transportation Needs (08/12/2023)    PRAPARE - Therapist, art (Medical): No     Lack of Transportation (Non-Medical): No   Physical Activity: Sufficiently Active (02/23/2023)    Exercise Vital Sign     Days of Exercise per Week: 5 days     Minutes of Exercise per Session: 40 min   Stress: Not on file   Social Connections: Not on file   Intimate Partner Violence: Not on file   Housing Stability: Low Risk  (08/12/2023)    Housing Stability Vital Sign     Unable to Pay for Housing in the Last Year: No     Number of Times Moved in the Last Year: 0     Homeless in the Last Year: No     OB History       Gravida   4    Para        Term   4    Preterm        AB        Living             SAB        IAB        Ectopic        Molar        Multiple        Live Births              Obstetric Comments   Menarche 75, LMP 83, # of children 63, age of 1st delivery 34, Hysterectomy/oophorectomy No/NO, Breast bx NO, history of breast feeding Yes, BCP No, Hormone therapy No               Current Outpatient Medications:     buPROPion (WELLBUTRIN XL)  150 MG extended release tablet, TAKE 1 TABLET BY MOUTH EVERY DAY IN THE MORNING, Disp: 90 tablet, Rfl: 1    diclofenac (VOLTAREN) 75 MG EC tablet, Take 1 tablet by mouth 2 times daily as needed for Pain, Disp: 60 tablet, Rfl: 0    atorvastatin (LIPITOR) 20 MG tablet, Take 1 tablet by mouth daily, Disp: 90 tablet, Rfl: 1    diazePAM (VALIUM) 5 MG tablet, Take 1 tablet by mouth every 12 hours as needed for Anxiety for up to 60 days. Max Daily Amount: 10 mg, Disp: 60 tablet, Rfl: 0    pregabalin (LYRICA) 150 MG capsule, Take 1 capsule by mouth 2 times daily for 180 days. Max Daily Amount: 300 mg, Disp: 60 capsule, Rfl: 2    rOPINIRole (REQUIP) 1 MG tablet, TAKE 1 TABLET AT  1-2PM AND TAKE 3 TABLETS BY MOUTH 1 TO 3 HOURS BEFORE BEDTIME EVERYDAY, Disp: 360 tablet, Rfl: 2    acetaminophen (TYLENOL) 325 MG tablet, Take by mouth every 4 hours as needed, Disp: , Rfl:     aspirin 81 MG chewable tablet, Take by mouth daily, Disp: , Rfl:     vitamin D3 (CHOLECALCIFEROL) 125 MCG (5000 UT) TABS tablet, TAKE 1 TABLET BY MOUTH DAILY, Disp: , Rfl:     lidocaine 4 % external patch, Place 1 patch onto the skin daily (Patient taking differently: Place 1 patch onto the skin as needed), Disp: 30 patch, Rfl: 0  No Known Allergies     Review of Systems   All other systems reviewed and are negative.        Physical Exam  Vitals and nursing note reviewed.   Chest:   Breasts:     Right: No swelling, bleeding, inverted nipple, mass, nipple discharge, skin change or tenderness.      Left: No swelling, bleeding, inverted nipple, mass, nipple discharge, skin change or tenderness.      Comments: Sternum tender to deep palpation   Implants intact   Lymphadenopathy:      Upper Body:      Right upper body: No axillary adenopathy.      Left upper body: No axillary adenopathy.            ASSESSMENT and PLAN   Diagnosis Orders   1. Bilateral nipple discharge  MAM TOMO DIGITAL DIAGNOSTIC BILATERAL        - overdue mammograms will order  - has sternal contusion from fall.. recommend heat, nsaids  Implants intact no damage   30 minutes was spent with patient on counseling and coordination of care.

## 2023-09-07 ENCOUNTER — Encounter

## 2023-09-07 NOTE — Other (Signed)
 LVM TO SCHEDULE PAT APPT

## 2023-09-08 NOTE — Other (Signed)
 PAT APPT 09/21/23 @ 11:00

## 2023-09-09 ENCOUNTER — Encounter

## 2023-09-12 NOTE — Telephone Encounter (Signed)
 Patient would like a call to discuss upcoming capsule endoscopy procedure. Patient would like a call to discuss if this needs to be done before back surgery or can it be done after surgery.     Patient also needs pre op.

## 2023-09-12 NOTE — Telephone Encounter (Signed)
 Patient was under the impression Dr.Glynn ordered the endoscopy procedure but nurse informed this was ordered by her gi doctor. She states she does not feel she needs this test anymore as she does not feel she is losing blood. Encouraged she follow up & discuss further with gi. She is scheduled to have a back procedure on March 31st & needs to schedule a pre-op. This has been scheduled for March 20th at 10:30. She will have preadmission testing on the 19th. She had no further questions or concerns.

## 2023-09-20 NOTE — Telephone Encounter (Signed)
 Patient called, stated that she would like a return call about surgery.

## 2023-09-20 NOTE — Telephone Encounter (Signed)
 Returned call to patient. 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 She states she has learned to manage her back pain & can deal another few months. Her upcoming pre-op appt has been cancelled & she will call to reschedule her surgery.

## 2023-09-20 NOTE — Telephone Encounter (Signed)
 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

## 2023-09-20 NOTE — Telephone Encounter (Signed)
 LMOM to call back

## 2023-09-21 ENCOUNTER — Inpatient Hospital Stay: Payer: MEDICARE | Primary: Internal Medicine

## 2023-09-21 NOTE — Other (Signed)
 PATIENT CALLED SHE'S GOING TO RESCHEDULE HER SURGERY UNTIL MAY. SHE HAS NOTIFIED THE SURGEON'S OFFICE PER VALERIE

## 2023-09-22 ENCOUNTER — Encounter: Payer: MEDICARE | Attending: Internal Medicine | Primary: Internal Medicine

## 2023-09-23 ENCOUNTER — Encounter

## 2023-09-23 MED ORDER — DIAZEPAM 5 MG PO TABS
5 | ORAL_TABLET | Freq: Two times a day (BID) | ORAL | 0 refills | Status: AC | PRN
Start: 2023-09-23 — End: 2023-10-23

## 2023-09-25 ENCOUNTER — Encounter

## 2023-09-26 MED ORDER — DICLOFENAC SODIUM 75 MG PO TBEC
75 | ORAL_TABLET | Freq: Two times a day (BID) | ORAL | 0 refills | Status: AC
Start: 2023-09-26 — End: ?

## 2023-09-26 NOTE — H&P (Signed)
 Lindsay [name] (DOB: 07-08-1955) is a 68 y.o. female, patient, here for evaluation of the following chief complaint(s):  Follow-up (MRI results)        ASSESSMENT/PLAN:     Below is the assessment and plan developed based on review of pertinent history, physical exam, labs, studies, and medications.     1. Chronic bilateral low back pain with right-sided sciatica  -     EOS XR FULL SPINE 2 VIEWS; Future  2. S/P lumbar fusion  -     EOS XR FULL SPINE 2 VIEWS; Future  3. S/P spinal fusion  -     EOS XR FULL SPINE 2 VIEWS; Future        Assessment & Plan  1. Postoperative status following T10-L2 fusion.  She reports persistent lower back pain that worsens with walking, standing, and sitting.  The pain is worsened since her last visit 2 years ago.  She has difficulty with prolonged ambulation and standing and had a number of falls.  She has spondylolisthesis now at L3-4 and L4-5 and a degenerative spondylosis at L5-S1.  I think she is a candidate for revision laminectomy at those levels with an extension of her fusion down to the pelvis.  Risk and benefits were discussed with her.           Procedure: Revision laminectomy L3-S1 with a T10 to ilium revision fusion        The risks and benefits were discussed at length with the patient and the patient has elected to proceed.  Indications for surgery include failed conservative treatment.  Alternative treatments, risks and the perioperative course were discussed with the patient.  All questions were answered.  The risks and benefits of the procedure were explained.  Benefits include definitive diagnosis, relief of pain, elimination of deformity and improved function.  Risks of surgery including bleeding, infection, weakness, numbness, CSF leak, failure to improve symptoms, exacerbation of medical co-morbidities and even death were discussed with the patient.   2. Lumbar spondylolisthesis, L3-4, L4-5, and L5-S1 with lumbar stenosis.  She has lumbar spondylolisthesis at L3-4,  L4-5, and L5-S1 with associated lumbar stenosis. This condition contributes to her chronic lower back pain and frequent falls due to instability. The plan is to proceed with the extension of her fusion to the pelvis to stabilize the affected areas.     PROCEDURE  The patient underwent a T10-L2 fusion procedure.     No follow-ups on file.        SUBJECTIVE/OBJECTIVE:     Lindsay [name] (DOB: 1956/03/23) is a 68 y.o. female.        History of Present Illness  The patient presents for evaluation of lower back pain. She is accompanied by her husband.     She reports persistent discomfort in the lower region of her back, which has been a constant source of distress. The pain intensifies during periods of walking, standing, and sitting. Her mobility is compromised due to frequent falls, attributed to an imbalance. Despite attempts to maintain an upright posture, she continues to experience significant pain. Her husband notes that each fall appears to exacerbate her condition, leading to a perceived regression in her overall health status. She expresses concern about the potential impact of further fusion surgeries on her movement.               No data to display  No data to display                   Pertinent Findings:  Results           AP, lateral, flexion-extension views of the lumbar spine reveal anterolisthesis at L5-S1.  Mild to moderate disc degeneration throughout the lumbar spine.  Prior T12 kyphoplasty.  Stable prior T10-L2 fusion.  Stable hardware.  No acute changes noted compared to July 2023 films.  No obvious acute fractures lytic lesions noted.           MRI Result (most recent):  MRI LUMBAR SPINE WO CONTRAST 06/18/2023     Narrative  EXAM: MRI LUMBAR SPINE WO CONTRAST     INDICATION: Arthrodesis status; Lumbago with sciatica, right side; Other chronic  pain. Arthrodesis status / With STIR images     COMPARISON: None     TECHNIQUE: MR imaging of the lumbar spine was performed using  the following  sequences: sagittal T1, T2, STIR;  axial T1, T2.     CONTRAST:  None.     FINDINGS:     There is a posterior fusion including T10, T11 L1 and L2, bridging the healed  burst fracture of T12. A moderate amount of blooming artifact is present from  the posterior fusion but the screws appear to be centrally located as best  assessed by MRI.     There is normal alignment of the lumbar spine. Marrow signal is normal. There is  a Schmorl's node resulting in a small concavity of the superior endplate of L4.     The conus medullaris terminates at L1. Signal and caliber of the distal spinal  cord are within normal limits.     The paraspinal soft tissues are within normal limits.     Lower thoracic spine: T11-12: Mild bilateral neural foraminal stenosis and  central stenosis at about 8.7 mm..     L1-L2: No herniation or stenosis.     L2-L3: No herniation or stenosis.     L3-L4: No herniation or stenosis.     L4-L5: No herniation or stenosis.     L5-S1: No herniation or stenosis.     Impression  Posterior fusion from T10-L2 without hardware failure  Healed T12 burst fracture with mild central stenosis     Electronically signed by Steele Sizer        Allergies   No Known Allergies        Current Facility-Administered Medications          Current Outpatient Medications   Medication Sig Dispense Refill    diazePAM (VALIUM) 5 MG tablet TAKE 1 TABLET BY MOUTH EVERY 8 HOURS AS NEEDED FOR ANXIETY OR SLEEP 90 tablet 1    atorvastatin (LIPITOR) 10 MG tablet TAKE 1 TABLET BY MOUTH EVERY DAY 90 tablet 1    rOPINIRole (REQUIP) 1 MG tablet TAKE 1 TABLET AT 1-2PM AND TAKE 3 TABLETS BY MOUTH 1 TO 3 HOURS BEFORE BEDTIME EVERYDAY 360 tablet 2    buPROPion (WELLBUTRIN XL) 150 MG extended release tablet TAKE 1 TABLET BY MOUTH EVERY MORNING 90 tablet 0    pregabalin (LYRICA) 150 MG capsule Take 1 capsule by mouth 2 times daily for 180 days. Max Daily Amount: 300 mg 90 capsule 1    acetaminophen (TYLENOL) 325 MG tablet Take by mouth  every 4 hours as needed        aspirin 81 MG chewable tablet Take by mouth daily  vitamin D3 (CHOLECALCIFEROL) 125 MCG (5000 UT) TABS tablet TAKE 1 TABLET BY MOUTH DAILY          No current facility-administered medications for this visit.            Past Medical History        Past Medical History:   Diagnosis Date    Abnormal brain scan      Anxiety disorder      Arthritis      Chronic pain      Depression      DVT (deep venous thrombosis) (HCC)      Falls frequently      Fracture of left ankle      Headache(784.0) 2011    Ill-defined condition 09/15/2015     FX R HAND    Neurocutaneous syndrome (HCC)      Obesity Mar. '23 - Aug. '24    Other and unspecified hyperlipidemia 12/27/2012    PE (pulmonary embolism) 12/2012    RLS (restless legs syndrome) 03/16/2016     Dr Scarlett Presto    TIA (transient ischemic attack) 03/2016     Right facial droop, numbness in lip    Torn rotator cuff       right            Past Surgical History         Past Surgical History:   Procedure Laterality Date    APPENDECTOMY        BREAST SURGERY   2002     AUGMENTATION    CESAREAN SECTION         x4    GI         COLONOSCOPY    IMPLANT BREAST SILICONE/EQ   2002     Bilateral breast implants    IR KYPHOPLASTY THORACIC 1 VERTEBRAL BODY   12/12/2019     IR KYPHOPLASTY THORACIC FIRST LEVEL 12/12/2019 [facility]    IR KYPHOPLASTY THORACIC 1 VERTEBRAL BODY   12/12/2019    JOINT REPLACEMENT   2016 knees    LUMBAR FUSION   06/02/2021    ORTHOPEDIC SURGERY   08/2004     rotater cuff r. shoulder    ORTHOPEDIC SURGERY         left ankle fracture treated with air cast    ORTHOPEDIC SURGERY   12/2011     left shoulder fxt and dislocation    OTHER SURGICAL HISTORY   2010     left foot torn tendon    TOTAL KNEE ARTHROPLASTY Bilateral 10/29/2015    TUBAL LIGATION                Family History         Family History   Problem Relation Age of Onset    No Known Problems Son      No Known Problems Daughter      No Known Problems Daughter      Heart  Disease Mother      Other Brother           Covid    Rheum Arthritis Sister      Cancer Father           thyroid cancer    Heart Disease Father      No Known Problems Daughter      Anesth Problems Neg Hx              Social History   Social  History            Tobacco Use    Smoking status: Never       Passive exposure: Past    Smokeless tobacco: Never   Vaping Use    Vaping status: Never Used    Passive vaping exposure: Yes   Substance Use Topics    Alcohol use: No    Drug use: No            Review of Systems        Vitals:  Ht 1.6 m (5\' 3" )   Wt 79.4 kg (175 lb)   BMI 31.00 kg/m    Body mass index is 31 kg/m.     Ortho Exam      Alert and Orient  x 3     Normal gait and station; normal posture     No assistive devices today.         Pertinent Physial Exam Findings:  Physical Exam      Integumentary  Assessment of Surgical Incision - healing and consistent with normal anticipated wound healing.     Neurologic  Sensory  Light Touch - Intact - Globally.  Overall Assessment of Muscle Strength and Tone reveals  Lower Extremities - Right Iliopsoas - 5/5. Left Iliopsoas - 5/5. Right Tibialis Anterior - 5/5. Left Tibialis Anterior - 5/5. Right Gastroc-Soleus - 5/5. Left Gastroc-Soleus - 5/5. Right EHL - 5/5. Left EHL - 5/5.  General Assessment of Reflexes  Right Ankle - Clonus is not present. Left Ankle - Clonus is not present.  Reflexes (Dermatomes)  2/2 Normal - Left Achilles (L5-S2), Left Knee (L2-4), Right Achilles (L5-S2) and Right Knee (L2-4).     Musculoskeletal  Global Assessment  Examination of related systems reveals - well-developed, well-nourished, in no acute distress, alert and oriented x 3 and normal coordination.  Spine/Ribs/Pelvis  Lumbosacral Spine - Examination of the lumbosacral spine reveals - no known fractures or deformities. Inspection and Palpation - Tenderness - moderate. Assessment of pain reveals the following findings - The pain is characterized as - moderate. Location - pain refers to  lower back bilaterally.         An electronic signature was used to authenticate this note.  -- Kem Parkinson, MD

## 2023-10-04 NOTE — Other (Signed)
 LVM TO SCHEDULE PAT APPT

## 2023-10-18 ENCOUNTER — Encounter: Payer: MEDICARE | Attending: Physician Assistant | Primary: Internal Medicine

## 2023-10-23 ENCOUNTER — Encounter

## 2023-10-24 MED ORDER — DICLOFENAC SODIUM 75 MG PO TBEC
75 | ORAL_TABLET | Freq: Two times a day (BID) | ORAL | 0 refills | 30.00000 days | Status: DC
Start: 2023-10-24 — End: 2023-11-23

## 2023-10-28 ENCOUNTER — Encounter

## 2023-10-28 MED ORDER — DIAZEPAM 5 MG PO TABS
5 | ORAL_TABLET | Freq: Two times a day (BID) | ORAL | 0 refills | 20.00000 days | Status: DC | PRN
Start: 2023-10-28 — End: 2023-11-30

## 2023-10-28 NOTE — Telephone Encounter (Signed)
 Last visit 08/26/23  Follow up not scheduled   Lab Results   Component Value Date    NA 139 08/08/2023    K 4.1 08/08/2023    CL 106 08/08/2023    CO2 27 08/08/2023    BUN 18 08/08/2023    CREATININE 0.84 08/08/2023    GLUCOSE 117 (H) 08/08/2023    CALCIUM  9.0 08/08/2023    BILITOT 0.2 08/08/2023    ALKPHOS 171 (H) 08/08/2023    AST 20 08/08/2023    ALT 21 08/08/2023    LABGLOM 75 08/08/2023    GFRAA >60 12/12/2019    AGRATIO 0.7 (L) 06/06/2021    GLOB 3.6 08/08/2023     Lab Results   Component Value Date    WBC 7.2 08/08/2023    HGB 9.8 (L) 08/08/2023    HCT 31.2 (L) 08/08/2023    MCV 76.3 (L) 08/08/2023    PLT 241 08/08/2023

## 2023-10-28 NOTE — Telephone Encounter (Signed)
 Patient called requesting her medication be refilled today     diazePAM  5 mg     [id number] - [place], [place] - [address] - P [phone] - F [phone]

## 2023-11-04 ENCOUNTER — Encounter

## 2023-11-04 ENCOUNTER — Inpatient Hospital Stay: Admit: 2023-11-04 | Payer: MEDICARE | Attending: Surgery | Primary: Internal Medicine

## 2023-11-04 DIAGNOSIS — N6452 Nipple discharge: Secondary | ICD-10-CM

## 2023-11-08 NOTE — Telephone Encounter (Signed)
Returned call. LMOM for call back.

## 2023-11-08 NOTE — Other (Signed)
 PT CALLED HAS NOTIFIED AMANDA TO CANCEL SURGERY DUE TO WORK CONFLICT.  PAT ON 5-7 CANCELLED.

## 2023-11-08 NOTE — Telephone Encounter (Signed)
 Lindsay [name] would like a call back regarding her Surgery that is scheduled for May 21st, she has a few questions she'd like to ask? Call back # is [phone]

## 2023-11-09 ENCOUNTER — Inpatient Hospital Stay: Payer: MEDICARE | Primary: Internal Medicine

## 2023-11-09 NOTE — Telephone Encounter (Signed)
 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

## 2023-11-09 NOTE — Other (Signed)
PAT: 5-7 @ 9;30

## 2023-11-14 ENCOUNTER — Encounter

## 2023-11-14 MED ORDER — PREGABALIN 150 MG PO CAPS
150 | ORAL_CAPSULE | Freq: Two times a day (BID) | ORAL | 2 refills | 30.00000 days | Status: AC
Start: 2023-11-14 — End: 2024-02-12

## 2023-11-17 ENCOUNTER — Encounter: Payer: MEDICARE | Attending: Specialist | Primary: Internal Medicine

## 2023-11-18 NOTE — Telephone Encounter (Signed)
 Returned call [facility] - to reschedule surgery

## 2023-11-23 ENCOUNTER — Encounter

## 2023-11-23 MED ORDER — DICLOFENAC SODIUM 75 MG PO TBEC
75 | ORAL_TABLET | Freq: Two times a day (BID) | ORAL | 3 refills | 30.00000 days | Status: DC
Start: 2023-11-23 — End: 2024-02-09

## 2023-11-30 ENCOUNTER — Encounter

## 2023-11-30 MED ORDER — DIAZEPAM 5 MG PO TABS
5 | ORAL_TABLET | Freq: Two times a day (BID) | ORAL | 1 refills | 20.00000 days | Status: AC | PRN
Start: 2023-11-30 — End: 2024-02-28

## 2023-11-30 NOTE — Telephone Encounter (Signed)
 The patient is requesting a Rx refill on the medication listed below:  Diazepam  5 mg  [id number] - [place], [place] - [address] - P [phone] - F [phone]

## 2023-12-07 ENCOUNTER — Encounter: Payer: MEDICARE | Attending: Physician Assistant | Primary: Internal Medicine

## 2024-01-05 ENCOUNTER — Encounter: Payer: MEDICARE | Attending: Specialist | Primary: Internal Medicine

## 2024-01-13 ENCOUNTER — Encounter: Payer: MEDICARE | Attending: Neurology | Primary: Internal Medicine

## 2024-01-13 ENCOUNTER — Emergency Department: Admit: 2024-01-13 | Payer: MEDICARE | Primary: Internal Medicine

## 2024-01-13 ENCOUNTER — Inpatient Hospital Stay
Admit: 2024-01-13 | Discharge: 2024-01-13 | Disposition: A | Discharge status: Home / Self Care | Payer: MEDICARE | Arrived: WI | Attending: Student in an Organized Health Care Education/Training Program

## 2024-01-13 DIAGNOSIS — S42291A Other displaced fracture of upper end of right humerus, initial encounter for closed fracture: Principal | ICD-10-CM

## 2024-01-13 MED ORDER — OXYCODONE HCL 5 MG PO TABS
5 | ORAL | Status: AC
Start: 2024-01-13 — End: 2024-01-13
  Administered 2024-01-13: 18:00:00 5 mg via ORAL

## 2024-01-13 MED ORDER — OXYCODONE HCL 5 MG PO TABS
5 | ORAL_TABLET | Freq: Four times a day (QID) | ORAL | 0 refills | 5.00000 days | Status: AC | PRN
Start: 2024-01-13 — End: 2024-01-16

## 2024-01-13 MED ORDER — OXYCODONE HCL 5 MG PO TABS
5 | ORAL | Status: AC
Start: 2024-01-13 — End: 2024-01-13
  Administered 2024-01-13: 21:00:00 5 mg via ORAL

## 2024-01-13 MED FILL — OXYCODONE HCL 5 MG PO TABS: 5 mg | ORAL | Qty: 1 | Fill #0

## 2024-01-13 NOTE — ED Notes (Signed)
 Pt is off the floor to CT at this time.

## 2024-01-13 NOTE — Discharge Instructions (Signed)
 Thank You!    It was a pleasure taking care of you in our Emergency Department today. We know that when you come to our Emergency Department, you are entrusting us  with your health, comfort, and safety. Our physicians and nurses honor that trust, and truly appreciate the opportunity to care for you and your loved ones.      We also value your feedback. If you receive a survey about your Emergency Department experience today, please fill it out.  We care about our patients' feedback, and we listen to what you have to say.  Thank you.    Arthea DELENA Fitzpatrick, MD  ________________________________________________________________________  I have included a copy of your lab results and/or radiologic studies from today's visit so you can have them easily available at your follow-up visit. We hope you feel better and please do not hesitate to contact the ED if you have any questions at all!    No results found for this or any previous visit (from the past 12 hours).  CT CHEST WO CONTRAST   Final Result   1. Acute, angulated, displaced, mildly comminuted, right humeral neck fracture.   2. Subacute superior sternal fracture.   3. Chronic T2, T7, and T12 compression fractures.   4. Osteoporosis.      Electronically signed by Medford Manners      CT SHOULDER RIGHT WO CONTRAST   Final Result   Acute proximal humerus and glenoid fractures as above. No dislocation.   Subacromial/subdeltoid bursal effusion with pain-fat level. Suspected   full-thickness rotator cuff tear.      Electronically signed by GAY SKIFF      XR ELBOW RIGHT (2 VIEWS)   Final Result    No acute bony abnormalities.         Electronically signed by NORVAL Huq      XR SHOULDER RIGHT (MIN 2 VIEWS)   Final Result   Right humeral neck fracture.         Electronically signed by NORVAL Husky          The exam and treatment you received in the Emergency Department were for an urgent problem and are not intended as complete care. It is important that you follow up with a  doctor, nurse practitioner, or physician assistant for ongoing care. If your symptoms become worse or you do not improve as expected and you are unable to reach your usual health care provider, you should return to the Emergency Department. We are available 24 hours a day.    Please take your discharge instructions with you when you go to your follow-up appointment.     If a prescription has been provided, please have it filled as soon as possible to prevent a delay in treatment. Read the entire medication instruction sheet provided to you by the pharmacy. If you have any questions or reservations about taking the medication due to side effects or interactions with other medications, please call your primary care physician or contact the ER to speak with the charge nurse.     Please make an appointment with your family doctor or the physician you were referred to for follow-up of this visit as instructed on your discharge paperwork. Return to the ER if you are unable to be seen or if you are unable to be seen in a timely manner.    Should you experience abdominal pain lasting greater than 6 hours, chest pain, difficulty breathing, fever/chills, numbness/tingling, skin changes or other symptoms  that concern you, return to the ED sooner. If you feel worse over the next 24 hours, please return to the ED. We are available 24 hours a day. Thank you for trusting us  with your care!

## 2024-01-13 NOTE — ED Provider Notes (Signed)
 [facility]  EMERGENCY DEPARTMENT ENCOUNTER    Patient Name: Lindsay [name]  MRN: [id number]  Birth Date: [birth year]  Provider: Arthea Fitzpatrick, MD  PCP: Rowland Dorien CROME, MD  Time/Date of evaluation:  01/13/24    History of Presenting Illness     Chief Complaint   Patient presents with    Fall     Patient arrives via wheelchair after tripping and falling forwards. She now has pain to her right shoulder and side. She states she is able to stand.       HISTORY (Narrative):   Lindsay [name] is a 68 y.o. female presents to the Emergency Department following a fall. The patient reports landing on their right shoulder and right elbow with some side to the right sided chest wall. The primary complaints are pain in the right shoulder and the rib under the arm. The patient describes pain in both the upper and lower parts of the right shoulder area. Fall occurred just PTA, negative numbness or tingling. Denies head iniury or LOC.     Allergies  - No known allergies    Review of Systems  Musculoskeletal: Positive for right shoulder pain and rib pain under the arm      Nursing Notes were all reviewed and agreed with or any disagreements were addressed in the HPI.    Past History     PAST MEDICAL HISTORY:  Past Medical History:   Diagnosis Date    Abnormal brain scan     Anxiety disorder     Arthritis     Chronic pain     Depression     DVT (deep venous thrombosis) (HCC)     Falls frequently     Fracture of left ankle     Headache(784.0) 2011    Ill-defined condition 09/15/2015    FX R HAND    Neurocutaneous syndrome (HCC)     Obesity Mar. '23 - Aug. '24    Other and unspecified hyperlipidemia 12/27/2012    PE (pulmonary embolism) 12/2012    RLS (restless legs syndrome) 03/16/2016    Dr Ronal Cornish    TIA (transient ischemic attack) 03/2016    Right facial droop, numbness in lip    Torn rotator cuff     right       PAST SURGICAL HISTORY:  Past Surgical History:   Procedure Laterality Date     APPENDECTOMY      BREAST SURGERY  2002    AUGMENTATION    CESAREAN SECTION      x4    COLONOSCOPY N/A 08/23/2023    COLONOSCOPY, ESOPHAGOGASTRODUODENOSCOPY WITH BIOPSY performed by Elaine Garden, MD at [facility]    GI      COLONOSCOPY    IMPLANT BREAST SILICONE/EQ  2002    Bilateral breast implants    IR KYPHOPLASTY THORACIC 1 VERTEBRAL BODY  12/12/2019    IR KYPHOPLASTY THORACIC FIRST LEVEL 12/12/2019 [facility]    IR KYPHOPLASTY THORACIC 1 VERTEBRAL BODY  12/12/2019    JOINT REPLACEMENT  2016 knees    LUMBAR FUSION  06/02/2021    ORTHOPEDIC SURGERY  08/2004    rotater cuff r. shoulder    ORTHOPEDIC SURGERY      left ankle fracture treated with air cast    ORTHOPEDIC SURGERY  12/2011    left shoulder fxt and dislocation    OTHER SURGICAL HISTORY  2010    left foot torn tendon  TOTAL KNEE ARTHROPLASTY Bilateral 10/29/2015    TUBAL LIGATION      UPPER GASTROINTESTINAL ENDOSCOPY N/A 08/23/2023    ESOPHAGOGASTRODUODENOSCOPY BIOPSY performed by Elaine Garden, MD at [facility]       FAMILY HISTORY:  Family History   Problem Relation Age of Onset    No Known Problems Son     No Known Problems Daughter     No Known Problems Daughter     Heart Disease Mother     Other Brother         Covid    Rheum Arthritis Sister     Cancer Father         thyroid cancer    Heart Disease Father     No Known Problems Daughter     Anesth Problems Neg Hx        SOCIAL HISTORY:  Social History     Tobacco Use    Smoking status: Never     Passive exposure: Past    Smokeless tobacco: Never   Vaping Use    Vaping status: Never Used    Passive vaping exposure: Yes   Substance Use Topics    Alcohol use: No    Drug use: No       MEDICATIONS:  No current facility-administered medications on file prior to encounter.     Current Outpatient Medications on File Prior to Encounter   Medication Sig Dispense Refill    diazePAM  (VALIUM ) 5 MG tablet Take 1 tablet by mouth 2 times daily as needed for Anxiety for up to 90 days. Max Daily  Amount: 10 mg 60 tablet 1    diclofenac  (VOLTAREN ) 75 MG EC tablet TAKE 1 TABLET BY MOUTH TWICE A DAY AS NEEDED FOR PAIN 60 tablet 3    pregabalin  (LYRICA ) 150 MG capsule Take 1 capsule by mouth 2 times daily for 90 days. Max Daily Amount: 300 mg 60 capsule 2    buPROPion  (WELLBUTRIN  XL) 150 MG extended release tablet TAKE 1 TABLET BY MOUTH EVERY DAY IN THE MORNING 90 tablet 1    atorvastatin  (LIPITOR) 20 MG tablet Take 1 tablet by mouth daily 90 tablet 1    rOPINIRole  (REQUIP ) 1 MG tablet TAKE 1 TABLET AT 1-2PM AND TAKE 3 TABLETS BY MOUTH 1 TO 3 HOURS BEFORE BEDTIME EVERYDAY 360 tablet 2    acetaminophen  (TYLENOL ) 325 MG tablet Take by mouth every 4 hours as needed      aspirin 81 MG chewable tablet Take by mouth daily      vitamin D3 (CHOLECALCIFEROL) 125 MCG (5000 UT) TABS tablet TAKE 1 TABLET BY MOUTH DAILY         ALLERGIES:  No Known Allergies    SOCIAL DETERMINANTS OF HEALTH:  Social Drivers of Health     Tobacco Use: Low Risk  (11/04/2023)    Patient History     Smoking Tobacco Use: Never     Smokeless Tobacco Use: Never     Passive Exposure: Past   Alcohol Use: Not At Risk (03/13/2023)    AUDIT-C     Frequency of Alcohol Consumption: Never     Average Number of Drinks: Patient does not drink     Frequency of Binge Drinking: Never   Financial Resource Strain: Low Risk  (02/23/2023)    Overall Financial Resource Strain (CARDIA)     Difficulty of Paying Living Expenses: Not hard at all   Food Insecurity: No Food Insecurity (08/12/2023)    Hunger Vital  Sign     Worried About Programme researcher, broadcasting/film/video in the Last Year: Never true     Ran Out of Food in the Last Year: Never true   Transportation Needs: No Transportation Needs (08/12/2023)    PRAPARE - Therapist, art (Medical): No     Lack of Transportation (Non-Medical): No   Physical Activity: Sufficiently Active (02/23/2023)    Exercise Vital Sign     Days of Exercise per Week: 5 days     Minutes of Exercise per Session: 40 min   Stress: Not on file    Social Connections: Not on file   Intimate Partner Violence: Not on file   Depression: Not at risk (08/12/2023)    PHQ-2     PHQ-2 Score: 0   Housing Stability: Low Risk  (08/12/2023)    Housing Stability Vital Sign     Unable to Pay for Housing in the Last Year: No     Number of Times Moved in the Last Year: 0     Homeless in the Last Year: No   Interpersonal Safety: Not At Risk (08/23/2023)    Interpersonal Safety Domain Source: IP Abuse Screening     Physical abuse: Denies     Verbal abuse: Denies     Emotional abuse: Denies     Financial abuse: Denies     Sexual abuse: Denies   Utilities: Not At Risk (08/12/2023)    [facility] Utilities     Threatened with loss of utilities: No       Review of Systems     Negative except as listed above in HPI.    Physical Exam     Vitals:    01/13/24 1339 01/13/24 1551   BP: 125/87    Pulse: 73    Resp: 16    Temp: 98.1 F (36.7 C)    SpO2: 97% 96%       Physical Exam  Vitals and nursing note reviewed.   Constitutional:       General: She is not in acute distress.     Appearance: Normal appearance.   HENT:      Head: Normocephalic and atraumatic.      Right Ear: External ear normal.      Left Ear: External ear normal.      Nose: Nose normal.      Mouth/Throat:      Mouth: Mucous membranes are moist.   Eyes:      Conjunctiva/sclera: Conjunctivae normal.      Pupils: Pupils are equal, round, and reactive to light.   Cardiovascular:      Rate and Rhythm: Normal rate and regular rhythm.   Pulmonary:      Effort: Pulmonary effort is normal. No respiratory distress.      Breath sounds: Normal breath sounds. No wheezing.   Abdominal:      General: Abdomen is flat. There is no distension.      Palpations: Abdomen is soft.      Tenderness: There is no abdominal tenderness. There is no guarding.   Musculoskeletal:         General: No swelling.      Cervical back: Normal range of motion and neck supple.      Comments: Tenderness to palpation of the right shoulder anteriorly as well as the right  elbow along the olecranon.  2+ radial pulses bilaterally.  Sensation intact and symmetric in the bilateral upper extremities no  deficits.  Patient has limited range of motion of the right shoulder, full range of motion of the right elbow.   Skin:     General: Skin is warm.      Capillary Refill: Capillary refill takes less than 2 seconds.   Neurological:      General: No focal deficit present.      Mental Status: She is alert and oriented to person, place, and time. Mental status is at baseline.       Diagnostic Study Results     LABS:  No results found for this visit on 01/13/24.    RADIOLOGIC STUDIES:   Non x-ray images such as CT, Ultrasound and MRI are read by the radiologist. X-ray images are visualized and preliminarily interpreted by the ED Provider with the findings as listed in the ED Course section below.     Interpretation per the Radiologist is listed below, if available at the time of this note:    CT CHEST WO CONTRAST   Final Result   1. Acute, angulated, displaced, mildly comminuted, right humeral neck fracture.   2. Subacute superior sternal fracture.   3. Chronic T2, T7, and T12 compression fractures.   4. Osteoporosis.      Electronically signed by Medford Manners      CT SHOULDER RIGHT WO CONTRAST   Final Result   Acute proximal humerus and glenoid fractures as above. No dislocation.   Subacromial/subdeltoid bursal effusion with pain-fat level. Suspected   full-thickness rotator cuff tear.      Electronically signed by GAY SKIFF      XR ELBOW RIGHT (2 VIEWS)   Final Result    No acute bony abnormalities.         Electronically signed by NORVAL Huq      XR SHOULDER RIGHT (MIN 2 VIEWS)   Final Result   Right humeral neck fracture.         Electronically signed by NORVAL Husky            ED Course and Differential Diagnosis/MDM     12:33 PM EDT DDx, ED Course, and Reassessment:    Vitals: No data found.    ED COURSE/Records Reviewed with summary (prior medical records and Nursing notes)  Nursing  Notes, Old Medical Records, Previous EKGs, Previous Radiology Studies, and Previous Laboratory Studies         Patient was given the following medications:    Medications   oxyCODONE  (ROXICODONE ) immediate release tablet 5 mg (5 mg Oral Given 01/13/24 1424)   oxyCODONE  (ROXICODONE ) immediate release tablet 5 mg (5 mg Oral Given 01/13/24 1648)     CONSULTS:    None    Social Determinants affecting Diagnosis/Treatment: None    DISCUSSION:  Medical Decision Making:    Patient presents after a mechanical fall, landing directly onto the right shoulder, with complaints of severe right shoulder pain and pain beneath the right arm along the rib cage. No loss of consciousness, confusion, or head trauma reported. Neurovascular status of the affected extremity remains intact, with preserved sensation, distal pulses, and capillary refill. Pain localized primarily to the proximal humerus region, prompting concerns for orthopedic injury.    Differential Diagnosis:   1. Proximal Humerus Fracture    Supporting: Direct trauma mechanism (fall onto shoulder), significant tenderness over proximal humerus, severe pain with range of motion.    Against: No visible deformity or obvious displacement on external examination; however, this does not exclude fracture.   2.  Rib Fracture    Supporting: Patient describes rib pain under arm after fall, mechanism consistent with direct trauma.    Against: CT chest negative for rib fractures, absence of focal tenderness or crepitus along ribs on examination.   3. Shoulder Dislocation    Supporting: Severe pain and difficulty moving shoulder post-trauma.    Against: Shoulder positioned normally on visual exam and imaging, no gross deformity suggestive of dislocation, patient has intact distal pulses.   4. Rotator Cuff Tear or Soft Tissue Injury    Supporting: Severe shoulder pain post-trauma, common injury in elderly after falls.    Against: X-ray imaging clearly demonstrates proximal humerus fracture,  providing definitive alternative diagnosis.    Diagnostics:    Right Shoulder X-ray: Proximal humerus fracture confirmed.    CT Chest: Negative for rib fractures or intrathoracic injury.    Management Plan:    Sling placed on right upper extremity for comfort and immobilization.    Orthopedic surgery consulted, recommending outpatient management of the fracture.    Pain medication administered and prescribed for home use.    Detailed discharge instructions provided, including strict orthopedic follow-up.    Fall precautions and home safety education reviewed with patient.    Disposition:    Discharged home in stable condition, with clear instructions to follow up urgently with orthopedics.    ADDITIONAL CONSIDERATIONS:  None  Procedures/Critical Care     Procedures    ED FINAL IMPRESSION     1. Closed fracture of anatomical neck of humerus, right, initial encounter        DISPOSITION/PLAN     DISPOSITION Decision To Discharge 01/13/2024 04:17:59 PM   DISPOSITION CONDITION Stable        Discharge Note: The patient is stable for discharge home. The signs, symptoms, diagnosis, and discharge instructions have been discussed, understanding conveyed, and agreed upon. The patient is to follow up as recommended or return to ER should their symptoms worsen.     PATIENT REFERRED TO:    Glynn, Francesca L, MD  [address] [facility]  [address]  [place] [address]  [phone]    In 1 week      [facility] Emergency Department  [address]  [place]  [address]  [phone]    If symptoms worsen    Nicholaus Martyn PARAS, MD  [address]  [address]  [place] [address]  [phone]    Call       Daryll Elouise BIRCH, MD  [address]  [address]  [place] [address]  [phone]    Call         DISCHARGE MEDICATIONS:       Medication List        ASK your doctor about these medications      acetaminophen  325 MG tablet  Commonly known as: TYLENOL      aspirin 81 MG chewable  tablet     atorvastatin  20 MG tablet  Commonly known as: LIPITOR  Take 1 tablet by mouth daily     buPROPion  150 MG extended release tablet  Commonly known as: WELLBUTRIN  XL  TAKE 1 TABLET BY MOUTH EVERY DAY IN THE MORNING     diazePAM  5 MG tablet  Commonly known as: VALIUM   Take 1 tablet by mouth 2 times daily as needed for Anxiety for up to 90 days. Max Daily Amount: 10 mg     diclofenac  75 MG EC tablet  Commonly known as: VOLTAREN   TAKE  1 TABLET BY MOUTH TWICE A DAY AS NEEDED FOR PAIN     oxyCODONE  5 MG immediate release tablet  Commonly known as: Roxicodone   Take 1 tablet by mouth every 6 hours as needed for Pain for up to 3 days. Intended supply: 3 days. Take lowest dose possible to manage pain Max Daily Amount: 20 mg  Ask about: Should I take this medication?     pregabalin  150 MG capsule  Commonly known as: LYRICA   Take 1 capsule by mouth 2 times daily for 90 days. Max Daily Amount: 300 mg     rOPINIRole  1 MG tablet  Commonly known as: REQUIP   TAKE 1 TABLET AT 1-2PM AND TAKE 3 TABLETS BY MOUTH 1 TO 3 HOURS BEFORE BEDTIME EVERYDAY     vitamin D3 125 MCG (5000 UT) Tabs tablet  Commonly known as: CHOLECALCIFEROL               Where to Get Your Medications        These medications were sent to [id number] - [place], [place] - [address] - P [phone] [address] [place] [phone]  [address], [place] [address]      Hours: 24-hours Phone: [phone]   oxyCODONE  5 MG immediate release tablet         DISCONTINUED MEDICATIONS:    Discharge Medication List as of 01/13/2024  4:20 PM          (Please note that parts of this dictation were completed with voice recognition software. Quite often unanticipated grammatical, syntax, homophones, and other interpretive errors are inadvertently transcribed by the computer software. Please disregards these errors. Please excuse any errors that have escaped final proofreading.)    I am the Primary Clinician of Record.   Arthea Fitzpatrick, MD            Zamoria Boss, Arthea LABOR, MD  01/31/24 1300

## 2024-01-13 NOTE — Telephone Encounter (Signed)
 Patient called into the office requesting to r/s her sx from 8/6 to 8/27 as discussed in a prior conversation.

## 2024-01-17 NOTE — Progress Notes (Signed)
 Can you please fill out a surgical planning form for [employer]. thanks

## 2024-01-23 NOTE — Telephone Encounter (Signed)
 Patient states she fell and broke her shoulder and is requesting a call back from nurse in regards to upcoming appt on 01/25/24. Please contact.

## 2024-01-24 NOTE — Telephone Encounter (Signed)
 LVM requesting a call back. JL

## 2024-01-25 ENCOUNTER — Encounter: Payer: MEDICARE | Attending: Neurology | Primary: Internal Medicine

## 2024-01-27 NOTE — Care Coordination-Inpatient (Signed)
 Ambulatory Care Coordination Note     01/27/2024 1:45 PM    Patient outreach attempt by this ACM today to offer care management services. ACM was unable to reach the patient by telephone today;   left voice message requesting a return phone call to this ACM.     ACM: Deitra KATHEE Hacker, RN    Follow Up:   Plan for next ACM outreach in approximately 1-2 days  to complete:  - outreach attempt to offer care management services.

## 2024-02-03 ENCOUNTER — Encounter

## 2024-02-03 NOTE — Telephone Encounter (Signed)
 Patient called wanting the Dr to review her xrays and give her input, patient believes she may need surgery    Lindsay [name] [phone]

## 2024-02-03 NOTE — Telephone Encounter (Signed)
 I called her back and offered support-[facility] is [facility] concerning [facility] shoulder fracture-not sure if needs a shoulder replacement or will heal without surgery

## 2024-02-06 NOTE — Care Coordination-Inpatient (Signed)
 Ambulatory Care Coordination Note     02/06/2024 1:27 PM     Patient Current Location:  [place]      This patient was received as a referral from Population health report .    ACM contacted the patient by telephone. Verified name and DOB with patient as identifiers. Provided introduction to self, and explanation of the ACM role.   Patient accepted care management services at this time.          ACM: Deitra KATHEE Hacker, RN     Challenges to be reviewed by the provider   Additional needs identified to be addressed with provider No                 Method of communication with provider: none.    Utilization: Initial Call - N/A    Care Summary Note:   -Pt seen in ER 7/11 for GLF, ended up having right humeral fracture.  -Has been following up with ortho outpatient.   -Reports still struggling with significant pain, but does not like to take pain medications.    Offered patient enrollment in the Remote Patient Monitoring (RPM) program for in-home monitoring: Yes, but did not enroll at this time: controlled chronic disease management.     Assessments Completed:       02/06/2024     1:17 PM   Amb Fall Risk Assessment and TUG Test   Do you feel unsteady or are you worried about falling?  no   2 or more falls in past year? no   Fall with injury in past year? yes    ,   Ambulatory Care Coordination Assessment    Care Coordination Protocol  Referral from Primary Care Provider: No  Week 1 - Initial Assessment     Do you have all of your prescriptions and are they filled?: Yes  Barriers to medication adherence: None  Are you able to afford your medications?: Yes  How often do you have trouble taking your medications the way you have been told to take them?: I always take them as prescribed.     Do you have Home O2 Therapy?: No      Ability to seek help/take action for Emergent Urgent situations i.e. fire, crime, inclement weather or health crisis.: Independent  Ability to ambulate to restroom: Independent  Ability handle personal hygeine  needs (bathing/dressing/grooming): Independent  Ability to manage Medications: Independent  Ability to prepare Food Preparation: Independent  Ability to maintain home (clean home, laundry): Independent  Ability to drive and/or has transportation: Independent  Ability to do shopping: Independent  Ability to manage finances: Independent  Is patient able to live independently?: Yes     Current Housing: Private Residence  Who do you live with?: Partner/Spouse/SO  Are you an active caregiver in your home?: No     Do you have any DME?: No     Per the Fall Risk Screening, did the patient have 2 or more falls or 1 fall with injury in the past year?: Yes  How often do you think you are about to fall and you do NOT fall? For example, you grab something to stabilize yourself or hold onto a wall/furniture?: Never  Use of a Mobility Aid: No  Difficulty walking/impaired gait: No  Issues with feet or shoes like numbness, edema, shoes not fitting: No  Changes in vision, poor vision or poor lighting in environment: No  Dizziness: No  Other Fall Risk: No     Frequent  urination at night?: No  Do you use rails/bars?: Yes  Do you have a non-slip tub mat?: No     Are you experiencing loss of meaning?: No  Are you experiencing loss of hope and peace?: No     Thinking about your patient's physical health needs, are there any symptoms or problems (risk indicators) you are unsure about that require further investigation?: Mild vague physical symptoms or problems; but do not impact on daily life or are not of concern to patient   Are the patient's physical health problems impacting on their mental well-being?: No identified areas of concern   Are there any problems with your patient's lifestyle behaviors (alcohol, drugs, diet, exercise) that are impacting on physical or mental well-being?: No identified areas of concern   Do you have any other concerns about your patient's mental well-being? How would you rate their severity and impact on the  patient?: No identified areas of concern   How would you rate their home environment in terms of safety and stability (including domestic violence, insecure housing, neighbor harassment)?: Consistently safe, supportive, stable, no identified problems   How do daily activities impact on the patient's well-being? (include current or anticipated unemployment, work, caregiving, access to transportation or other): No identified problems or perceived positive benefits   How would you rate their social network (family, work, friends)?: Good participation with social networks   How would you rate their financial resources (including ability to afford all required medical care)?: Financially secure, resources adequate, no identified problems   How wells does the patient now understand their health and well-being (symptoms, signs or risk factors) and what they need to do to manage their health?: Reasonable to good understanding and already engages in managing health or is willing to undertake better management   How well do you think your patient can engage in healthcare discussions? (Barriers include language, deafness, aphasia, alcohol or drug problems, learning difficulties, concentration): Clear and open communication, no identified barriers   Do other services need to be involved to help this patient?: Other care/services in place and adequate   Are current services involved with this patient well-coordinated? (Include coordination with other services you are now recommendation): Required care/services in place and adequately coordinated   Suggested Interventions and Community Resources  Fall Risk Prevention: In Process Disease Specific Clinic: In Process (Comment: Followed by ortho)   Zone Management Tools: In Process         Other Interventions: Set up/Review Goals           ,   Care Coordination Interventions    Referral from Primary Care Provider: No  Suggested Interventions and Community Resources  Fall Risk  Prevention: In Process  Disease Specific Clinic: In Process (Comment: Followed by ortho)  Zone Management Tools: In Process      ,   General Assessment    Do you have any symptoms that are causing concern?: Yes  Progression since Onset: Unchanged  Reported Symptoms: Pain (Comment: Right shoulder/arm pain)          Medications Reviewed:   Not completed during this call: deferred    Advance Care Planning:   Not reviewed during this call     Care Planning:    Goals Addressed                   This Visit's Progress     Conditions and Symptoms        I will schedule office  visits, as directed by my provider.  I will keep my appointment or reschedule if I have to cancel.  I will notify my provider of any barriers to my plan of care.  I will follow my Zone Management tool to seek urgent or emergent care.  I will notify my provider of any symptoms that indicate a worsening of my condition.    Barriers: overwhelmed by complexity of regimen and pain  Plan for overcoming my barriers: Ongoing support, education, and resources from [facility].  Confidence: 7/10  Anticipated Goal Completion Date: 05/06/24    02/06/2024  -Pt attended ortho f/u appts 7/16 and 7/23. Was told her fracture was displaced, but they would allow some time to see if it would start to heal conservatively.  -Pt expressed frustration over her numerous injuries over the last few years, stating this most recent injury felt like the straw that broke the camels back.  -Pt states she thinks ortho is going to tell her she needs surgery to fix her fracture, but admits she doesn't think she wants to go through with surgery at this time.  -Provided therputic listening and support. Discussed taking things one day at a time.  -Pt to attend ortho f/u tomorrow, 8/5. Will discuss necessary next treatment steps with ortho provider and go from there.  -Pt to cont to wear sling at all times as instructed by ortho.  -Will send ACM Intro Letter and RCRT for review. CD                  PCP/Specialist follow up:   Future Appointments         Provider Specialty Dept Phone    03/15/2024 10:00 AM Spencer Isaiah RAMAN, [place] Orthopedic Surgery [phone]    04/12/2024 10:40 AM Lindon Baron, MD Orthopedic Surgery [phone]    04/26/2024 1:30 PM Shelnutt, Rowe FALCON, APRN - NP Neurology [phone]            Follow Up:   Plan for next ACM outreach in approximately 1 week to complete:  - disease specific assessments  - SDOH assessments  - medication review   - advance care planning   - goal progression  - education .   Patient  is agreeable to this plan.

## 2024-02-07 NOTE — Progress Notes (Signed)
 CARE TEAM:  Patient Care Team:  Rowland Dye, MD as PCP - General (Internal Medicine)    Note: This chart was prepared using voice-recognition software and may contain unintended word substitution errors. An addendum for corrections can be made upon request.     ASSESSMENT   Diagnosis Plan   1. Other closed displaced fracture of proximal end of right humerus, initial encounter  X-ray shoulder right 2+ views (73030)      2. Closed nondisplaced fracture of glenoid cavity of right scapula, initial encounter          Patient Active Problem List   Diagnosis   . Anxiety   . Closed supracondylar fracture of humerus   . RLS (restless legs syndrome)   . Pulmonary embolism   . Abnormal brain scan   . Atelectasis   . Chest pain   . Depression   . Dyslipidemia, goal LDL below 100   . Headache(784.0)   . Paresthesia of lower lip   . Primary osteoarthritis of left knee   . Respiratory failure   . S/P kyphoplasty   . Compression fracture of body of thoracic vertebra   . Severe episode of recurrent major depressive disorder, without psychotic features   . Primary osteoarthritis of right knee   . Contusion of left knee   . Sprain of anterior talofibular ligament of left ankle   . Sprain of anterior talofibular ligament of right ankle   . Congestive heart failure     PLAN  Treatment Plan:   -We discussed continued non-operative management versus reverse total shoulder arthroplasty  -We discussed Reverse total shoulder arthroplasty with open biceps tenodesis ([id number]). We discussed that this is a major surgery and the risks, benefits, complications and convalescence regarding shoulder replacement surgery. We discussed stiffness, bleeding, infection, continued pain, instability, neurovascular injury, thromboembolic disease and anesthetic complications. We talked about postoperative activity restrictions. The patient agreed to proceed with surgery.  -CT scan ordered for deformity evaluation for preoperative planning of joint  arthroplasty surgery  -The patient was given preoperative clearance instructions. she was referred to Pre-Admission Testing.   - Planning Right Reverse Total shoulder arthroplasty at [facility] on 02/10/24     Orders Placed This Encounter   . X-ray shoulder right 2+ views (26969)    Return for Post-Op Check.  HISTORY OF PRESENT ILLNESS  Chief Complaint: Follow-up of the Right Shoulder   Age: 68 y.o.  Sex: female   Hand-dominance: Right    History of present illness: Lindsay [name] presents today for evaluation of right shoulder pain. Symptoms began 01/13/24.  She had a fall when she was leaving the dentist office.  It happened quickly and she was not aware of the exact positioning during the injury.  She has a shoulder, elbow and chest wall pain.  She has had swelling and especially bruising of the upper arm area.  She has been in sling ever since then.  She has had severe pain.  Is limiting her ability to use her hand or her arm.  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    Pain rating = 6  out of 10   Past Medical History:   Diagnosis Date   . Anxiety    . Congestive heart failure    . Depression    . Hyperlipidemia        Past Surgical History:   Procedure Laterality Date   . JOINT REPLACEMENT     .  KNEE SURGERY     . NO RELEVANT SURGERIES     . ROTATOR CUFF REPAIR           Current Outpatient Medications:   .  ASPIRIN  LOW DOSE 81 MG EC tablet, TK 1 T PO QD, Disp: , Rfl: 0  .  atorvastatin  (LIPITOR) 10 MG tablet, Take 10 mg by mouth daily, Disp: , Rfl:   .  buPROPion  SR (WELLBUTRIN  SR) 150 MG 12 hr tablet, , Disp: , Rfl:   .  buPROPion  XL (WELLBUTRIN  XL) 150 MG 24 hr tablet, Take 150 mg by mouth every morning, Disp: , Rfl:   .  D-5000 125 MCG (5000 UT) tablet, Take 1 tablet by mouth once daily, Disp: , Rfl:   .  diazepam  (VALIUM ) 5 MG tablet, TK 3 TS PO D, Disp: , Rfl: 5  .  famotidine  (PEPCID ) 20 MG tablet, Take 1 tablet by mouth 2 (two) times a day, Disp: , Rfl:   .   ferrous sulfate 325 (65 Fe) MG tablet, Take 1 tablet by mouth once daily, Disp: , Rfl:   .  furosemide (LASIX) 40 MG tablet, Take 40 mg by mouth once daily, Disp: , Rfl:   .  ibuprofen (ADVIL,MOTRIN) 200 MG tablet, Take by mouth., Disp: , Rfl:   .  Ibuprofen 40 MG/ML suspension, Take by mouth, Disp: , Rfl:   .  Na Sulfate-K Sulfate-Mg Sulf 17.5-3.13-1.6 GM/177ML solution, , Disp: , Rfl:   .  Omeprazole  (PRILOSEC PO), Take 20 mg by mouth., Disp: , Rfl:   .  omeprazole  (PriLOSEC) 40 MG capsule, Take 40 mg by mouth every morning before breakfast, Disp: , Rfl:   .  potassium chloride (KLOR-CON) 20 MEQ CR tablet, Take 20 mEq by mouth, Disp: , Rfl:   .  pregabalin  (LYRICA ) 150 MG capsule, TAKE 1 CAPSULE BY MOUTH 2 TIMES DAILY FOR 180 DAYS. MAX DAILY AMOUNT: 300 MG, Disp: , Rfl:   .  rOPINIRole  (REQUIP ) 1 MG tablet, TK 1 T PO Q NIGHT 1 TO 3 H B BED, Disp: , Rfl: 3  .  sucralfate  (CARAFATE ) 1 g tablet, Take 1 tablet by mouth 4 (four) times a day, Disp: , Rfl:   .  Vitamin D, Ergocalciferol, 50000 UNITS capsule, , Disp: , Rfl:      Family History   Problem Relation Age of Onset   . Coronary artery disease Mother    . Coronary artery disease Father    . No Known Problems Brother    . No Known Problems Sister    . No Known Problems Son    . No Known Problems Daughter    . No Known Problems Other    . Diabetes Neg Hx    . Clotting disorder Neg Hx    . Anesthesia problems Neg Hx         Social History     Socioeconomic History   . Marital status: Married     Spouse name: Not on file   . Number of children: Not on file   . Years of education: Not on file   . Highest education level: Not on file   Occupational History   . Not on file   Tobacco Use   . Smoking status: Never   . Smokeless tobacco: Never   Substance and Sexual Activity   . Alcohol use: Yes     Comment: social   . Drug use: Never   . Sexual activity:  Not on file   Other Topics Concern   . Not on file   Social History Narrative   . Not on file     Social Drivers of  Health     Financial Resource Strain: Low Risk  (02/23/2023)    Received from [facility]    Overall Physicist, medical Strain (CARDIA)    . Difficulty of Paying Living Expenses: Not hard at all   Food Insecurity: No Food Insecurity (08/12/2023)    Received from [facility]    Hunger Vital Sign    . Worried About Programme researcher, broadcasting/film/video in the Last Year: Never true    . Ran Out of Food in the Last Year: Never true   Transportation Needs: No Transportation Needs (08/12/2023)    Received from [facility]    PRAPARE - Transportation    . Lack of Transportation (Medical): No    . Lack of Transportation (Non-Medical): No   Physical Activity: Sufficiently Active (02/23/2023)    Received from [facility]    Exercise Vital Sign    . Days of Exercise per Week: 5 days    . Minutes of Exercise per Session: 40 min   Stress: Not on file   Social Connections: Not on file   Intimate Partner Violence: Not on file   Housing Stability: Low Risk  (08/12/2023)    Received from [facility]    Housing Stability Vital Sign    . Unable to Pay for Housing in the Last Year: No    . Number of Times Moved in the Last Year: 0    . Homeless in the Last Year: No       OBJECTIVE  BP Readings from Last 1 Encounters:   02/07/24 119/78      Constitutional:  No acute distress. Her body mass index is 32.95 kg/m.     Musculoskeletal   Shoulder Musculoskeletal Exam    Inspection    Right      Ecchymosis: mild      Peripheral edema: mild      Atrophy: none      Symmetry: symmetric      Masses: none      Skin tenting: none    Palpation    Right      Crepitus: no crepitus      Tenderness: present        Anterior shoulder: moderate        Posterior shoulder: moderate        Greater tuberosity: moderate    Range of Motion    Right      Active ROM: abnormal and pain.       Passive ROM: abnormal and pain.     Left      Active ROM: normal.     Neurovascular     Right      Capillary refill: brisk      Axillary nerve sensory distribution: normal      Ulnar nerve sensory distribution: normal      Median nerve sensory distribution: normal      Radial nerve sensory distribution: normal      Musculocutaneous nerve sensory distribution: normal    Special Tests    Right    Rotator Cuff Signs      Painful arc test: positive     General      Constitutional: appears stated age,  well-developed and well-nourished    Scleral icterus: no    Labored breathing: no    Psychiatric: normal mood and affect and no acute distress    Neurological: alert and oriented x3    Skin: intact    IMAGING / STUDIES   Order: XR SHOULDER 2+ VW RIGHT - Indication: Other closed displaced   fracture of proximal end of right humerus, initial encounter       X-ray shoulder right 2+ views (26969)  Result Date: 02/07/2024  AP, Scapula Y, Grashey.     Impression: The x-rays of the right shoulder show the surgical neck proximal fracture with extension into the greater and lesser tuberosities.  The humeral head has collapsed into valgus with further progression since the last 2 images.  The bone appears osteopenic.  There is a irregularity to the anterior glenoid consistent with a glenoid fracture.        I have reviewed outside diagnostic imaging on 01/13/24 and my independent interpretation is: CT scan of the right shoulder shows a three-part proximal humerus fracture.  The humeral head appears osteopenic.  The greater tuberosity is separated from the humeral head.  There is a nondisplaced anterior-inferior glenoid fracture. I agree with the radiologist read:  Impression    Acute proximal humerus and glenoid fractures as above. No dislocation.  Subacromial/subdeltoid bursal effusion with pain-fat level. Suspected  full-thickness rotator cuff tear.    Electronically signed by GAY SKIFF   PROCEDURES  Procedures  Lindsay Bertin, MD

## 2024-02-08 NOTE — Telephone Encounter (Signed)
 Patient called in wanting to be seen for a second opinion for her shoulder. She is currently scheduled for a Shoulder sx on 02/10/24 due to a fall with [facility]. She would like a call back regarding the surgery that is scheduled with Dr. Fleeta. She is going to have to reschedule due to the shoulder surgery.

## 2024-02-08 NOTE — Other (Signed)
 LVM TO SCHEDULE PAT APPT

## 2024-02-09 ENCOUNTER — Inpatient Hospital Stay: Admit: 2024-02-09 | Payer: MEDICARE | Primary: Internal Medicine

## 2024-02-09 VITALS — BP 118/74 | HR 84 | Temp 98.30000°F | Ht 63.5 in | Wt 187.0 lb

## 2024-02-09 DIAGNOSIS — Z01810 Encounter for preprocedural cardiovascular examination: Principal | ICD-10-CM

## 2024-02-09 LAB — CBC
Hematocrit: 36 % (ref 35.0–47.0)
Hemoglobin: 11.1 g/dL — ABNORMAL LOW (ref 11.5–16.0)
MCH: 26.3 pg (ref 26.0–34.0)
MCHC: 30.8 g/dL (ref 30.0–36.5)
MCV: 85.3 FL (ref 80.0–99.0)
MPV: 9.8 FL (ref 8.9–12.9)
Nucleated RBCs: 0 /100{WBCs}
Platelets: 253 K/uL (ref 150–400)
RBC: 4.22 M/uL (ref 3.80–5.20)
RDW: 14.5 % (ref 11.5–14.5)
WBC: 5.7 K/uL (ref 3.6–11.0)
nRBC: 0 K/uL (ref 0.00–0.01)

## 2024-02-09 LAB — URINALYSIS WITH REFLEX TO CULTURE
BACTERIA, URINE: NEGATIVE /HPF
Bilirubin, Urine: NEGATIVE
Blood, Urine: NEGATIVE
Glucose, Ur: NEGATIVE mg/dL
Ketones, Urine: NEGATIVE mg/dL
Leukocyte Esterase, Urine: NEGATIVE
Nitrite, Urine: NEGATIVE
Protein, UA: NEGATIVE mg/dL
Specific Gravity, UA: 1.017 (ref 1.003–1.030)
Urobilinogen, Urine: 0.2 EU/dL (ref 0.2–1.0)
pH, Urine: 8 (ref 5.0–8.0)

## 2024-02-09 LAB — HEMOGLOBIN A1C
Estimated Avg Glucose: 129 mg/dL
Hemoglobin A1C: 6.1 % — ABNORMAL HIGH (ref 4.0–5.6)

## 2024-02-09 LAB — PROTIME-INR
INR: 1 (ref 0.9–1.1)
Protime: 10.3 s (ref 9.2–11.2)

## 2024-02-09 LAB — BASIC METABOLIC PANEL
Anion Gap: 12 mmol/L (ref 2–14)
BUN/Creatinine Ratio: 20 (ref 12–20)
BUN: 15 mg/dL (ref 8–23)
CO2: 27 mmol/L (ref 20–29)
Calcium: 9.6 mg/dL (ref 8.8–10.2)
Chloride: 101 mmol/L (ref 98–107)
Creatinine: 0.76 mg/dL (ref 0.60–1.00)
Est, Glom Filt Rate: 84 ml/min/1.73m2 — ABNORMAL LOW (ref >90)
Glucose: 92 mg/dL (ref 65–100)
Potassium: 4.1 mmol/L (ref 3.5–5.1)
Sodium: 140 mmol/L (ref 136–145)

## 2024-02-09 NOTE — Other (Signed)
PAT: 8-7 @ 1;30

## 2024-02-09 NOTE — Telephone Encounter (Signed)
 Completed.

## 2024-02-09 NOTE — Other (Signed)
 WHEN SUBMITTING PTS TYPE AND SCREEN ORDER, AND ALERT POPPED UP STATING THAT TYPE AND SCREEN MAY TAKE LONGER D/T RBC ANTIBODIES. NURSE CALLED BLOOD BANK AND SPOKE TO NICOLE, NICOLE IS AWARE AND STATES THAT T&S SHOULD BE RESULTED IN TIME FOR SURGERY.

## 2024-02-09 NOTE — Other (Signed)
 [facility]                  [address], [place], [place] [address]     MAIN PRE OP            [phone]                                                                                AMBULATORY PRE OP          [phone]    PRE-ADMISSION TESTING    [phone]     Surgery Date:  02/10/2024       You will be called  the business day before your surgery to inform you of the time you should arrive at the hospital. (If your surgery is on a Monday, we will call you the Friday before.)    Call [phone] after 7pm Monday-Friday if you did not receive this call.    INSTRUCTIONS BEFORE YOUR SURGERY   When You  Arrive Arrive at [facility] Patient Access on 1st floor the day of your surgery.    Have your insurance card, photo ID,living will/advanced directive/POA (if applicable),  and any copayment (if needed)     Food   and   Drink NO solid food after midnight the night before surgery. You can drink clear liquids from midnight until ONE hour prior to your arrival at the hospital on the day of your surgery.     Clear liquids include:  Water   Apple juice (no sediment)  Carbonated beverages  Black coffee(no cream/milk)  Tea(no cream/milk)  Gatorade    No alcohol (beer, wine, liquor) or marijuana (smoking) 24 hours prior to surgery.   No marijuana edibles for 3 days prior to surgery.    Stop smoking cigarettes 14 days before surgery (helps w/healing and breathing).     Medications to   TAKE   Morning of Surgery MEDICATIONS TO TAKE THE MORNING OF SURGERY WITH A SIP OF WATER : ATORVASTATIN , WELLBUTRIN  AND LYRICA .     You may take these medications, IF NEEDED, the morning of surgery: TYLENOL , AND VALIUM     Ask your surgeon/prescribing doctor for instructions on taking or stopping these medications prior to surgery: N/A     Medications to STOP  before surgery Non-Steroidal anti-inflammatory Drugs (NSAID's): for example, Diclofenac (Voltaren ),  Ibuprofen (Advil, Motrin), Naproxen (Aleve) 3 days    STOP all  herbal supplements and vitamins(unless prescribed by your doctor), and fish oil for 7 days    Other:HOLD ASPIRIN  DAY OF SURGERY.   HOLD VITAMIN D STARTING TODAY, 8/7.     (Pain medications not listed above, including Tylenol  may be taken up until 4 hours prior to arrival time)     Blood  Thinners If you take Aspirin , Eliquis, Plavix, Coumadin, or any blood-thinning or anti-blood clot medicine, talk to the doctor who prescribed the medications for pre-operative instructions.    If you take aspirin  or aspirin  containing products for pain, stop 7 days prior to surgery     Bathing Clothing  Jewelry  Valuables     When you shower the morning of surgery, please do not apply anything  to your skin such as lotions, powders,  or makeup, (especially mascara).     Remove fingernail polish except for clear.    Do not shave or trim anywhere 24 hours before surgery    Wear your hair loose or down; no ponytails, buns, or metal hair clips    Wear loose, comfortable, clean clothes    Wear glasses instead of contacts. Bring a case to keep your glasses safe.    Leave money, valuables, and jewelry, including body piercings, at home    If you use inhalers or CPAP machine, bring it with you the day of surgery.     Going Home - or Spending the Night OUTPATIENT SURGERY: You must have a responsible adult drive you home and stay with you 24 hours after surgery. You may not drive for 24 hours after surgery.    ADMITS: If your doctor is keeping you in the hospital after surgery, leave personal belongings/luggage in your car until you have a hospital room number.    Hospital discharge time is 12 noon  Drivers must be here before 12 noon unless you are told differently     Special Instructions Free valet parking is available from 6 AM until 4:30 PM.         Preventing Infections Before and After - Your Surgery    IMPORTANT INSTRUCTIONS      You play an important role in your health and preparation for surgery. To reduce the germs on your skin you  will need to shower with CHG soap (Chorhexidine gluconate 4%) two times before surgery.    CHG soap (Hibiclens, Hex-A-Clens or store brand)  CHG soap will be provided at your Preadmission Testing (PAT) appointment.  If you do not have a PAT appointment before surgery, you may arrange to pick up CHG soap from our office or purchase CHG soap at a pharmacy, grocery or department store.  You need to purchase TWO 4 ounce bottles to use for your 2 showers.    Shower with CHG soap 2 times before your surgery  The evening before your surgery  The morning of your surgery    Steps to follow:  Wash your hair with your normal shampoo and your body with regular soap and rinse well to remove shampoo and soap from your skin.  Wet a clean washcloth and turn off the shower.  Put CHG soap on washcloth and apply to your entire body from the neck down. Do not use on your head, face or private parts(genitals). Do not use CHG soap on open sores, wounds or areas of skin irritation.  Wash your body gently for 5 minutes. Do not wash your skin too hard. This soap does not create lather. Pay special attention to your underarms and from your belly button to your feet.  Turn the shower back on and rinse well to get CHG soap off your body.  Pat your skin dry with a clean, dry towel. Do not apply lotions or moisturizer.  Put on clean clothes and sleep on fresh bed sheets and do not allow pets to sleep with you.      Tips to help prevent infections after your surgery:  Protect your surgical wound from germs:  Hand washing is the most important thing you and your caregivers can do to prevent infections.  Keep your bandage clean and dry!  Do not touch your surgical wound.  Use clean, freshly washed towels and washcloths every time you shower; do not  share bath linens with others.  Until your surgical wound is healed, wear clothing and sleep on bed linens that are clean.  Do not allow pets to sleep in your bed with you or touch your surgical  wound.  Do not smoke - smoking delays wound healing. This may be a good time to stop smoking.  If you have diabetes, it is important for you to manage your blood sugar levels properly before your surgery as well as after your surgery. Poorly managed blood sugar levels slow down wound healing and prevent you from healing completely.        Testing for Staphylococcus aureus on your skin before surgery    Staphylococcus aureus (staph) is a common bacteria that is found on the body. It normally does not cause infection on healthy skin. Before surgery, you will be tested to see if you have staph by swabbing the inside of your nose. When you have an incision with surgery, the goal is to protect that incision from infection. Removal of the staph bacteria before surgery can decrease the risk of a surgical site infection.    If your nose swab is positive for staph you will be called. Your treatment will include 2 steps:  Prescription for Mupirocin ointment to be used in each nostril twice a day for 5 days.  Showering with Chlorhexidine (CHG) liquid soap for 5 days prior to surgery (follow same CHG Shower Instructions as above).    How to use Mupirocin ointment in your nose  Pick up the prescription from your pharmacy. You will receive a large tube of ointment which will be big enough for all of your treatments. You will apply this ointment to each nostril 2 times a day for 5 days.  Wash your hands with sanitizer gel or soap and water  for 20 seconds before using ointment.  Place a pea-sized amount of ointment on a cotton Q-tip.  Apply ointment just inside of each nostril with the Q-tip. Do not push Q-tip or ointment deep inside you nose.  Press your nostrils together and massage for a few seconds.  Wash your hands with sanitizer gel or soap and water  after you are finished.  Do not get ointment near your eyes. If it gets into your eyes, rinse them with cool water .  If you need to use nasal spray, clean the tip of the bottle  with alcohol before use and do not use both at the same time.  If you are scheduled for COVID testing during the 5 days, do NOT apply morning dose until after the COVID test has been performed.        Follow all instructions so your surgery won't be cancelled.  Please, be on time.                    If a situation occurs and you are delayed the day of surgery, call [phone]/ [phone].    If your physical condition changes (like a fever, cold, flu, etc.) call your surgeon as soon as possible.    The patient was contacted in person.     She verbalized understanding of all instructions does not need reinforcement.

## 2024-02-10 ENCOUNTER — Inpatient Hospital Stay: Payer: MEDICARE | Attending: Orthopaedic Surgery

## 2024-02-10 ENCOUNTER — Ambulatory Visit: Admit: 2024-02-10 | Payer: MEDICARE | Primary: Internal Medicine

## 2024-02-10 MED ORDER — ROCURONIUM BROMIDE 50 MG/5ML IV SOLN
50 | Freq: Once | INTRAVENOUS | Status: DC | PRN
Start: 2024-02-10 — End: 2024-02-10
  Administered 2024-02-10: 18:00:00 20 via INTRAVENOUS
  Administered 2024-02-10 (×2): 50 via INTRAVENOUS

## 2024-02-10 MED ORDER — LACTATED RINGERS IV SOLN
INTRAVENOUS | Status: DC
Start: 2024-02-10 — End: 2024-02-10
  Administered 2024-02-10: 15:00:00 via INTRAVENOUS

## 2024-02-10 MED ORDER — PROCHLORPERAZINE EDISYLATE 10 MG/2ML IJ SOLN
10 | Freq: Once | INTRAMUSCULAR | Status: DC | PRN
Start: 2024-02-10 — End: 2024-02-10

## 2024-02-10 MED ORDER — NORMAL SALINE FLUSH 0.9 % IV SOLN
0.9 | Freq: Two times a day (BID) | INTRAVENOUS | Status: DC
Start: 2024-02-10 — End: 2024-02-10

## 2024-02-10 MED ORDER — TRANEXAMIC ACID 1000 MG/10ML IV SOLN
1000 | INTRAVENOUS | Status: AC
Start: 2024-02-10 — End: 2024-02-10

## 2024-02-10 MED ORDER — PHENYLEPHRINE HCL (PRESSORS) 0.4 MG/10ML IV SOSY
0.4 | Freq: Once | INTRAVENOUS | Status: DC | PRN
Start: 2024-02-10 — End: 2024-02-10
  Administered 2024-02-10 (×2): 40 via INTRAVENOUS
  Administered 2024-02-10: 18:00:00 80 via INTRAVENOUS

## 2024-02-10 MED ORDER — SODIUM CHLORIDE 0.9 % IV SOLN
0.9 | INTRAVENOUS | Status: DC | PRN
Start: 2024-02-10 — End: 2024-02-10
  Administered 2024-02-10: 17:00:00 40 via INTRAVENOUS

## 2024-02-10 MED ORDER — NORMAL SALINE FLUSH 0.9 % IV SOLN
0.9 | INTRAVENOUS | Status: DC | PRN
Start: 2024-02-10 — End: 2024-02-10

## 2024-02-10 MED ORDER — VANCOMYCIN HCL 1 G IV SOLR
1 | INTRAVENOUS | Status: AC
Start: 2024-02-10 — End: 2024-02-10

## 2024-02-10 MED ORDER — METHOCARBAMOL 500 MG PO TABS
500 | ORAL_TABLET | Freq: Four times a day (QID) | ORAL | 1 refills | 10.00000 days | Status: AC | PRN
Start: 2024-02-10 — End: 2024-02-20

## 2024-02-10 MED ORDER — DEXAMETHASONE 4 MG/ML IJ SOLN (MIXTURES ONLY)
4 | Freq: Once | INTRAMUSCULAR | Status: DC | PRN
Start: 2024-02-10 — End: 2024-02-10
  Administered 2024-02-10: 17:00:00 4 via INTRAVENOUS

## 2024-02-10 MED ORDER — BUPIVACAINE LIPOSOME 1.3 % IJ SUSP
1.3 | Freq: Once | INTRAMUSCULAR | Status: AC | PRN
Start: 2024-02-10 — End: 2024-02-10
  Administered 2024-02-10: 15:00:00 20 via PERINEURAL

## 2024-02-10 MED ORDER — MIDAZOLAM HCL (PF) 2 MG/2ML IJ SOLN
2 | Freq: Once | INTRAMUSCULAR | Status: AC
Start: 2024-02-10 — End: 2024-02-10
  Administered 2024-02-10: 15:00:00 2 mg via INTRAVENOUS

## 2024-02-10 MED ORDER — ACETAMINOPHEN 500 MG PO TABS
500 | Freq: Once | ORAL | Status: AC
Start: 2024-02-10 — End: 2024-02-10
  Administered 2024-02-10: 14:00:00 1000 mg via ORAL

## 2024-02-10 MED ORDER — ONDANSETRON HCL 4 MG/2ML IJ SOLN
4 | Freq: Once | INTRAMUSCULAR | Status: DC | PRN
Start: 2024-02-10 — End: 2024-02-10
  Administered 2024-02-10: 18:00:00 4 via INTRAVENOUS

## 2024-02-10 MED ORDER — TRANEXAMIC ACID 1000 MG/10ML IV SOLN
1000 | INTRAVENOUS | Status: DC | PRN
Start: 2024-02-10 — End: 2024-02-10
  Administered 2024-02-10: 19:00:00 1000 via TOPICAL

## 2024-02-10 MED ORDER — ONDANSETRON HCL 4 MG/2ML IJ SOLN
4 | Freq: Once | INTRAMUSCULAR | Status: DC | PRN
Start: 2024-02-10 — End: 2024-02-10

## 2024-02-10 MED ORDER — BUPIVACAINE LIPOSOME 1.3 % IJ SUSP
1.3 | INTRAMUSCULAR | Status: AC
Start: 2024-02-10 — End: 2024-02-10

## 2024-02-10 MED ORDER — SODIUM CHLORIDE (PF) 0.9 % IJ SOLN
0.9 | INTRAMUSCULAR | Status: DC | PRN
Start: 2024-02-10 — End: 2024-02-10

## 2024-02-10 MED ORDER — SUGAMMADEX SODIUM 200 MG/2ML IV SOLN
200 | INTRAVENOUS | Status: AC
Start: 2024-02-10 — End: 2024-02-10

## 2024-02-10 MED ORDER — LIDOCAINE HCL (PF) 2 % IJ SOLN
2 | Freq: Once | INTRAMUSCULAR | Status: DC | PRN
Start: 2024-02-10 — End: 2024-02-10
  Administered 2024-02-10: 17:00:00 80 via INTRAVENOUS

## 2024-02-10 MED ORDER — PROPOFOL BOLUS 10 MG/ML SOLN (WRAPPER)
10 | Freq: Once | INTRAVENOUS | Status: DC | PRN
Start: 2024-02-10 — End: 2024-02-10
  Administered 2024-02-10: 17:00:00 130 via INTRAVENOUS
  Administered 2024-02-10: 18:00:00 30 via INTRAVENOUS

## 2024-02-10 MED ORDER — CELECOXIB 200 MG PO CAPS
200 | Freq: Once | ORAL | Status: AC
Start: 2024-02-10 — End: 2024-02-10
  Administered 2024-02-10: 14:00:00 400 mg via ORAL

## 2024-02-10 MED ORDER — BUPIVACAINE HCL (PF) 0.5 % IJ SOLN
0.5 | INTRAMUSCULAR | Status: AC
Start: 2024-02-10 — End: 2024-02-10

## 2024-02-10 MED ORDER — PREGABALIN 75 MG PO CAPS
75 | Freq: Once | ORAL | Status: AC
Start: 2024-02-10 — End: 2024-02-10
  Administered 2024-02-10: 14:00:00 75 mg via ORAL

## 2024-02-10 MED ORDER — EPHEDRINE SULFATE (PRESSORS) 50 MG/ML IV SOLN
50 | Freq: Once | INTRAVENOUS | Status: DC | PRN
Start: 2024-02-10 — End: 2024-02-10
  Administered 2024-02-10: 18:00:00 10 via INTRAVENOUS

## 2024-02-10 MED ORDER — FENTANYL CITRATE (PF) 100 MCG/2ML IJ SOLN
100 | INTRAMUSCULAR | Status: DC | PRN
Start: 2024-02-10 — End: 2024-02-10

## 2024-02-10 MED ORDER — VANCOMYCIN HCL 1 G IV SOLR
1 | INTRAVENOUS | Status: DC | PRN
Start: 2024-02-10 — End: 2024-02-10
  Administered 2024-02-10: 19:00:00 1000 via TOPICAL

## 2024-02-10 MED ORDER — OXYCODONE HCL 5 MG PO TABS
5 | ORAL_TABLET | ORAL | 0 refills | 5.00000 days | Status: AC | PRN
Start: 2024-02-10 — End: 2024-02-15

## 2024-02-10 MED ORDER — SODIUM CHLORIDE 0.9 % IV SOLN
0.9 | INTRAVENOUS | Status: DC | PRN
Start: 2024-02-10 — End: 2024-02-10

## 2024-02-10 MED ORDER — BUPIVACAINE HCL (PF) 0.5 % IJ SOLN
0.5 | Freq: Once | INTRAMUSCULAR | Status: AC | PRN
Start: 2024-02-10 — End: 2024-02-10
  Administered 2024-02-10: 15:00:00 10 via PERINEURAL

## 2024-02-10 MED ORDER — ASPIRIN 81 MG PO CHEW
81 | ORAL_TABLET | Freq: Two times a day (BID) | ORAL | 3 refills | 30.00000 days | Status: AC
Start: 2024-02-10 — End: ?

## 2024-02-10 MED ORDER — EPHEDRINE SULFATE (PRESSORS) 50 MG/ML IV SOLN
50 | INTRAVENOUS | Status: AC
Start: 2024-02-10 — End: 2024-02-10

## 2024-02-10 MED ORDER — CEFAZOLIN SODIUM 1 G IJ SOLR
1 | Freq: Once | INTRAMUSCULAR | Status: AC
Start: 2024-02-10 — End: 2024-02-10
  Administered 2024-02-10: 17:00:00 2000 mg via INTRAVENOUS

## 2024-02-10 MED ORDER — PHENYLEPHRINE HCL (PRESSORS) 0.4 MG/10ML IV SOSY
0.4 | INTRAVENOUS | Status: AC
Start: 2024-02-10 — End: 2024-02-10

## 2024-02-10 MED ORDER — LABETALOL HCL 5 MG/ML IV SOLN
5 | INTRAVENOUS | Status: DC | PRN
Start: 2024-02-10 — End: 2024-02-10

## 2024-02-10 MED ORDER — SODIUM CHLORIDE 0.9 % IV SOLN
0.9 | INTRAVENOUS | Status: DC
Start: 2024-02-10 — End: 2024-02-10

## 2024-02-10 MED ORDER — HYDROMORPHONE HCL PF 1 MG/ML IJ SOLN
1 | INTRAMUSCULAR | Status: DC | PRN
Start: 2024-02-10 — End: 2024-02-10

## 2024-02-10 MED ORDER — ROCURONIUM BROMIDE 50 MG/5ML IV SOLN
50 | INTRAVENOUS | Status: AC
Start: 2024-02-10 — End: 2024-02-10

## 2024-02-10 MED ORDER — OXYCODONE HCL 5 MG PO TABS
5 | Freq: Once | ORAL | Status: AC
Start: 2024-02-10 — End: 2024-02-10
  Administered 2024-02-10: 21:00:00 5 mg via ORAL

## 2024-02-10 MED FILL — EPHEDRINE SULFATE (PRESSORS) 50 MG/ML IV SOLN: 50 mg/mL | INTRAVENOUS | Qty: 1 | Fill #0

## 2024-02-10 MED FILL — ROCURONIUM BROMIDE 50 MG/5ML IV SOLN: 50 MG/5ML | INTRAVENOUS | Qty: 5 | Fill #0

## 2024-02-10 MED FILL — LACTATED RINGERS IV SOLN: INTRAVENOUS | Qty: 1000 | Fill #0

## 2024-02-10 MED FILL — PHENYLEPHRINE HCL (PRESSORS) 0.4 MG/10ML IV SOSY: 0.4 MG/10ML | INTRAVENOUS | Qty: 10 | Fill #0

## 2024-02-10 MED FILL — OXYCODONE HCL 5 MG PO TABS: 5 mg | ORAL | Qty: 1 | Fill #0

## 2024-02-10 MED FILL — CEFAZOLIN SODIUM 1 G IJ SOLR: 1 g | INTRAMUSCULAR | Qty: 2000 | Fill #0

## 2024-02-10 MED FILL — EXPAREL 1.3 % IJ SUSP: 1.3 % | INTRAMUSCULAR | Qty: 20 | Fill #0

## 2024-02-10 MED FILL — TRANEXAMIC ACID 1000 MG/10ML IV SOLN: 1000 MG/10ML | INTRAVENOUS | Qty: 10 | Fill #0

## 2024-02-10 MED FILL — CELECOXIB 200 MG PO CAPS: 200 mg | ORAL | Qty: 2 | Fill #0

## 2024-02-10 MED FILL — VANCOMYCIN HCL 1 G IV SOLR: 1 g | INTRAVENOUS | Qty: 1000 | Fill #0

## 2024-02-10 MED FILL — NORMAL SALINE FLUSH 0.9 % IV SOLN: 0.9 % | INTRAVENOUS | Qty: 40 | Fill #0

## 2024-02-10 MED FILL — PREGABALIN 75 MG PO CAPS: 75 mg | ORAL | Qty: 1 | Fill #0

## 2024-02-10 MED FILL — SENSORCAINE-MPF 0.5 % IJ SOLN: 0.5 % | INTRAMUSCULAR | Qty: 10 | Fill #0

## 2024-02-10 MED FILL — BRIDION 200 MG/2ML IV SOLN: 200 MG/2ML | INTRAVENOUS | Qty: 2 | Fill #0

## 2024-02-10 MED FILL — MIDAZOLAM HCL 2 MG/2ML IJ SOLN: 2 mg/mL | INTRAMUSCULAR | Qty: 2 | Fill #0

## 2024-02-10 MED FILL — SODIUM CHLORIDE 0.9 % IV SOLN: 0.9 % | INTRAVENOUS | Qty: 1000 | Fill #0

## 2024-02-10 MED FILL — ACETAMINOPHEN EXTRA STRENGTH 500 MG PO TABS: 500 mg | ORAL | Qty: 2 | Fill #0

## 2024-02-10 NOTE — Anesthesia Procedure Notes (Signed)
 Peripheral Block    Patient location during procedure: pre-op  Reason for block: post-op pain management and at surgeon's request  Start time: 02/10/2024 11:12 AM  Staffing  Performed: anesthesiologist   Anesthesiologist: Delvin Sieving, MD  Performed by: Delvin Sieving, MD  Authorized by: Gionni Vaca, MD    Preanesthetic Checklist  Completed: patient identified, site marked, risks and benefits discussed, surgical/procedural consents, pre-op evaluation, timeout performed, anesthesia consent given, oxygen available and monitors applied/VS acknowledged  Peripheral Block   Prep: ChloraPrep  Patient monitoring: cardiac monitor, continuous pulse ox, frequent blood pressure checks, IV access and oxygen  Block type: Brachial plexus  Supraclavicular  Laterality: right  Injection technique: single-shot  Guidance: ultrasound guided    Needle   Needle type: insulated echogenic nerve stimulator needle   Needle gauge: 21 G  Needle localization: ultrasound guidance  Needle length: 10 cm  Assessment   Injection assessment: negative aspiration for heme, no paresthesia on injection, local visualized surrounding nerve on ultrasound and no intravascular symptoms  Paresthesia pain: none  Slow fractionated injection: yes  Hemodynamics: stable  Outcomes: uncomplicated    Medications Administered  BUPivacaine  liposome (EXPAREL ) injection 1.3% - Perineural   20 mL - 02/10/2024 11:12:00 AM  BUPivacaine  (MARCAINE ) PF injection 0.5% - Perineural   10 mL - 02/10/2024 11:12:00 AM

## 2024-02-10 NOTE — Discharge Instructions (Addendum)
 ANESTHESIA DISCHARGE INSTRUCTIONS    All Clothing/Valuables Returned To Patient Before D/C Home    PATIENT INSTRUCTIONS:    The first meal should be something that is light; not greasy or spicy.  If this is tolerated, then advance to regular diet as tolerated.      After general anesthesia or intravenous sedation, for 24 hours or while taking prescription Narcotics:  Limit your activities  Do not drive and operate hazardous machinery  Do not make important personal or business decisions  Do  not drink alcoholic beverages  If you have not urinated within 8 hours after discharge, please contact your surgeon on call.    Pain  Take pain medication as directed by your doctor   Call your doctor if pain is NOT relieved by medication  DO NOT take aspirin  or blood thinners until directed by your doctor    Report the following to your surgeon:  Excessive pain, swelling, redness or odor of or around the surgical area  Temperature over 100.5  Nausea and vomiting lasting longer than 4 hours or if unable to take medications  Any signs of decreased circulation or nerve impairment to extremity: change in color, persistent  numbness, tingling, coldness or increase pain  Any questions    ALSO:  FOLLOW ANY INSTRUCTIONS SPECIFIED BY YOUR SURGEON         Dr. Romelle Reverse total shoulder replacement Postoperative Instructions    Follow-Up Appointment:  Follow up in the office 2 weeks after surgery.  If this appointment is not already scheduled, please call our office at [phone], extension [phone].  Activity:  The operative extremity is Non-weightbearing. You should NOT use this arm for any pushing, pulling, reaching, lifting or assistance getting to or maintaining a standing position.  You can use the operative-sided hand. You can feed yourself. You should not carry objects more than 2 pounds.   Ambulate every hour. Use the incentive spirometer every half hour when resting.  A sling will be used for the first 2 weeks when  sleeping or ambulating. You can transition out of the sling as you can tolerate.  Remove the sling three times daily to do range of motion exercises of the elbow and wrist. It is ok to do pendulum exercises to the shoulder.  Do Not externally rotate the shoulder past straight forward or active inward-rotation towards the belly for 6 weeks after surgery.   Application of the ice packs will prevent and treat inflammation and reduce pain and swelling.  You should ice the incisional area at least 3 times a day, 20-30 minutes at a time.  Prevent any falls. Clear your living environment of rugs or floor objects that may be tripping hazards.  Dressings / Wound Care:  Keep dressing in place until the follow-up visit.   Dermabond (skin glue) may be used to help close the incision, which appears as a thin, transparent covering over the incision. Do not pick or pull at this covering, this will fall off naturally within 2-3 weeks.   Do not apply antibiotic ointment, creams or lotions to your incision.  Once your waterproof dressing has been removed, you may change bandages daily if you like or leave them open to air.  These can be purchased at your local pharmacy.  Most incisions are closed with absorbable sutures, but if not, the sutures or staples will be removed at your 2 week follow up.  Showering / Bathing:  If your incision is dry without drainage,  you may shower following your discharge home.  The dressing is waterproof if well affixed to skin.  You may shower with the waterproof dressing in place, but please be sure it is adhered to the skin and does not allow water  to get underneath.   It is fine to have water  run over the incision after the waterproof dressing has been removed.   Do not vigorously scrub your incision.    Do not soak or submerge in a bath, pool, jacuzzi, ocean, river or lake for 6 weeks after surgery.    If there is continued drainage or you are concerned contact Dr. Romelle office prior to showering  [phone], ext [phone]  Diet:  You may advance to your regular diet as tolerated.    Proper nutrition is crucial to healing.  Make sure you are eating as healthily as possible and following a balanced. protein-rich diet after your surgery.  Avoid processed foods and eat fruits and vegetables.  Medications:  It is our goal to keep you as comfortable as possible after your surgery.  Please take your medications as prescribed without exceeding the recommended dosage.   It is also important to understand that pain has a cycle.  It begins and increases until medication interrupts it.  The aim of good pain control is to stop the pain before it becomes intolerable.  The key is to stay ahead of the pain.  We encourage patients to discontinue opioid pain medications as soon as possible after surgery.  The side effects of these medications can be substantial, and the narcotic medications are not mandatory. You may substitute a prescribed narcotic with over-the-counter Tylenol . Tylenol  should not exceed 4000mg  in a 24 hour period, including if it is in the pain medication.  Pain medications may cause constipation. Over-the-counter Colace twice daily and Miralax while taking the narcotic medication should help prevent constipation.    Other side effects of pain medication include dizziness, headache, nausea, vomiting, and urinary retention.    Discontinue the pain medication if you develop itching, rash, shortness of breath, or difficulties swallowing.  If these symptoms become severe or are not relieved by discontinuing the medication, you should seek immediate medical attention.  Refills of pain medication are authorized during office hours only (8 AM- 4 PM Monday through Friday). Our office will not prescribe narcotics beyond 6 weeks from the date of your surgery.  Narcotics will not be called into the pharmacy, and, in most cases, you must be seen clinically.  Driving:  You should not return to driving until you are off  all narcotic pain medications and able to safely control and steer a motor vehicle. This may take 6 weeks or more because of the limited shoulder motion and activity.  Airports and metal detectors:  ID cards are no longer given after joint replacement, as they are not accepted by [employer] security.  Most joint replacements do not set off metal detectors. If the detector is set off, just tell the security officer you have a joint replacement.  Signs/Symptoms of Concern:   Contact Dr. Romelle office if any of the following signs or symptoms develop. Please be advised if a problem arises which you feel requires immediate medical attention you should seek medical attention at the closest ER.  Temperature greater than 101.5 for more than 8 hrs  Fever and/or chills that persist for greater than 8 hr.  A sudden increase in pain/swelling/ or tenderness in the back of your calf or  thigh that is not relieved with ice/elevation and pain medication.  Increased drainage from your incision, increased redness or warmth at the area of your incision or a sudden increase in swelling or redness that persists despite ice and elevation      If you have questions or concerns, please call Dr. Romelle staff    Office: Phone [phone], extension [phone]  Fax [phone]    Office Address: Cordella Bertin, M.D.    [facility]   [address] [place], [place]  [address]    Cordella Bertin, MD      02/10/24

## 2024-02-10 NOTE — Anesthesia Pre-Procedure Evaluation (Addendum)
 Department of Anesthesiology  Preprocedure Note       Name:  Lindsay [name]   Age:  68 y.o.  DOB:  January 02, 1956                                          MRN:  [id number]         Date:  02/09/2024      Surgeon: Clotilde):  Patrcia Hacker, MD    Procedure: Procedure(s):  RIGHT REVERSE TOTAL SHOULDER ARTHROPLASTY, WITH OPEN BICEPS TENODESIS (GENERAL WITH INTERSCALENE BLOCK)    Medications prior to admission:   Prior to Admission medications    Medication Sig Start Date End Date Taking? Authorizing Provider   diazePAM  (VALIUM ) 5 MG tablet Take 1 tablet by mouth 2 times daily as needed for Anxiety for up to 90 days. Max Daily Amount: 10 mg 11/30/23 02/28/24  Glynn, Francesca L, MD   diclofenac  (VOLTAREN ) 75 MG EC tablet TAKE 1 TABLET BY MOUTH TWICE A DAY AS NEEDED FOR PAIN 11/23/23   Glynn, Francesca L, MD   pregabalin  (LYRICA ) 150 MG capsule Take 1 capsule by mouth 2 times daily for 90 days. Max Daily Amount: 300 mg 11/14/23 02/12/24  Travis Rolene RAMAN, MD   buPROPion  (WELLBUTRIN  XL) 150 MG extended release tablet TAKE 1 TABLET BY MOUTH EVERY DAY IN THE MORNING 08/28/23   Glynn, Francesca L, MD   atorvastatin  (LIPITOR) 20 MG tablet Take 1 tablet by mouth daily 08/14/23   Glynn, Francesca L, MD   rOPINIRole  (REQUIP ) 1 MG tablet TAKE 1 TABLET AT 1-2PM AND TAKE 3 TABLETS BY MOUTH 1 TO 3 HOURS BEFORE BEDTIME EVERYDAY 05/30/23   Travis Rolene RAMAN, MD   acetaminophen  (TYLENOL ) 325 MG tablet Take by mouth every 4 hours as needed    Automatic Reconciliation, Ar   aspirin  81 MG chewable tablet Take by mouth daily 03/27/16   Automatic Reconciliation, Ar   vitamin D3 (CHOLECALCIFEROL) 125 MCG (5000 UT) TABS tablet TAKE 1 TABLET BY MOUTH DAILY 01/09/20   Automatic Reconciliation, Ar       Current medications:    No current facility-administered medications for this encounter.     Current Outpatient Medications   Medication Sig Dispense Refill    diazePAM  (VALIUM ) 5 MG tablet Take 1 tablet by mouth 2 times daily as needed for Anxiety for up to 90  days. Max Daily Amount: 10 mg 60 tablet 1    diclofenac  (VOLTAREN ) 75 MG EC tablet TAKE 1 TABLET BY MOUTH TWICE A DAY AS NEEDED FOR PAIN 60 tablet 3    pregabalin  (LYRICA ) 150 MG capsule Take 1 capsule by mouth 2 times daily for 90 days. Max Daily Amount: 300 mg 60 capsule 2    buPROPion  (WELLBUTRIN  XL) 150 MG extended release tablet TAKE 1 TABLET BY MOUTH EVERY DAY IN THE MORNING 90 tablet 1    atorvastatin  (LIPITOR) 20 MG tablet Take 1 tablet by mouth daily 90 tablet 1    rOPINIRole  (REQUIP ) 1 MG tablet TAKE 1 TABLET AT 1-2PM AND TAKE 3 TABLETS BY MOUTH 1 TO 3 HOURS BEFORE BEDTIME EVERYDAY 360 tablet 2    acetaminophen  (TYLENOL ) 325 MG tablet Take by mouth every 4 hours as needed      aspirin  81 MG chewable tablet Take by mouth daily      vitamin D3 (CHOLECALCIFEROL) 125 MCG (5000 UT) TABS tablet  TAKE 1 TABLET BY MOUTH DAILY         Allergies:  No Known Allergies    Problem List:    Patient Active Problem List   Diagnosis Code    Chest pain R07.9    Closed supracondylar fracture of humerus S42.413A    RLS (restless legs syndrome) G25.81    Atelectasis J98.11    Back pain M54.9    Abnormal brain scan R94.02    Depression F32.A    Anxiety F41.9    Primary osteoarthritis of right knee M17.11    Paresthesia of lower lip R20.2    Dyslipidemia, goal LDL below 100 E78.5    Primary osteoarthritis of left knee M17.12    Compression fracture of body of thoracic vertebra (HCC) S22.000A    Recurrent depression F33.9    Recurrent pulmonary embolism (HCC) I26.99    History of spinal fusion Z98.1    Severe episode of recurrent major depressive disorder, without psychotic features (HCC) F33.2    S/P spinal fusion Z98.1       Past Medical History:        Diagnosis Date    Abnormal brain scan     Anxiety disorder     Arthritis     Chronic pain     Depression     DVT (deep venous thrombosis) (HCC)     Falls frequently     Fracture of left ankle     Headache(784.0) 2011    Ill-defined condition 09/15/2015    FX R HAND     Neurocutaneous syndrome (HCC)     Obesity Mar. '23 - Aug. '24    Other and unspecified hyperlipidemia 12/27/2012    PE (pulmonary embolism) 12/2012    RLS (restless legs syndrome) 03/16/2016    Dr Ronal Cornish    TIA (transient ischemic attack) 03/2016    Right facial droop, numbness in lip    Torn rotator cuff     right       Past Surgical History:        Procedure Laterality Date    APPENDECTOMY      BREAST SURGERY  2002    AUGMENTATION    CESAREAN SECTION      x4    COLONOSCOPY N/A 08/23/2023    COLONOSCOPY, ESOPHAGOGASTRODUODENOSCOPY WITH BIOPSY performed by Elaine Garden, MD at [facility]    GI      COLONOSCOPY    IMPLANT BREAST SILICONE/EQ  2002    Bilateral breast implants    IR KYPHOPLASTY THORACIC 1 VERTEBRAL BODY  12/12/2019    IR KYPHOPLASTY THORACIC FIRST LEVEL 12/12/2019 [facility]    IR KYPHOPLASTY THORACIC 1 VERTEBRAL BODY  12/12/2019    JOINT REPLACEMENT  2016 knees    LUMBAR FUSION  06/02/2021    ORTHOPEDIC SURGERY  08/2004    rotater cuff r. shoulder    ORTHOPEDIC SURGERY      left ankle fracture treated with air cast    ORTHOPEDIC SURGERY  12/2011    left shoulder fxt and dislocation    OTHER SURGICAL HISTORY  2010    left foot torn tendon    TOTAL KNEE ARTHROPLASTY Bilateral 10/29/2015    TUBAL LIGATION      UPPER GASTROINTESTINAL ENDOSCOPY N/A 08/23/2023    ESOPHAGOGASTRODUODENOSCOPY BIOPSY performed by Elaine Garden, MD at [facility]       Social History:    Social History     Tobacco Use    Smoking  status: Never     Passive exposure: Past    Smokeless tobacco: Never   Substance Use Topics    Alcohol use: No                                Counseling given: Not Answered      Vital Signs (Current): There were no vitals filed for this visit.                                           BP Readings from Last 3 Encounters:   01/13/24 125/87   08/26/23 134/82   08/23/23 (!) 98/58       NPO Status:                                                                                 BMI:   Wt  Readings from Last 3 Encounters:   09/02/23 86.2 kg (190 lb)   08/26/23 86.2 kg (190 lb)   08/23/23 87.1 kg (192 lb)     There is no height or weight on file to calculate BMI.    CBC:   Lab Results   Component Value Date/Time    WBC 7.2 08/08/2023 03:00 AM    RBC 4.09 08/08/2023 03:00 AM    HGB 9.8 08/08/2023 03:00 AM    HCT 31.2 08/08/2023 03:00 AM    MCV 76.3 08/08/2023 03:00 AM    RDW 19.9 08/08/2023 03:00 AM    PLT 241 08/08/2023 03:00 AM       CMP:   Lab Results   Component Value Date/Time    NA 139 08/08/2023 03:00 AM    K 4.1 08/08/2023 03:00 AM    CL 106 08/08/2023 03:00 AM    CO2 27 08/08/2023 03:00 AM    BUN 18 08/08/2023 03:00 AM    CREATININE 0.84 08/08/2023 03:00 AM    GFRAA >60 12/12/2019 04:16 AM    AGRATIO 0.7 06/06/2021 02:50 AM    LABGLOM 75 08/08/2023 03:00 AM    LABGLOM 48 05/02/2022 10:26 PM    LABGLOM >60 06/06/2021 02:50 AM    GLUCOSE 117 08/08/2023 03:00 AM    CALCIUM  9.0 08/08/2023 03:00 AM    BILITOT 0.2 08/08/2023 03:00 AM    ALKPHOS 171 08/08/2023 03:00 AM    ALKPHOS 98 06/06/2021 02:50 AM    AST 20 08/08/2023 03:00 AM    ALT 21 08/08/2023 03:00 AM       POC Tests: No results for input(s): POCGLU, POCNA, POCK, POCCL, POCBUN, POCHEMO, POCHCT in the last 72 hours.    Coags:   Lab Results   Component Value Date/Time    PROTIME 9.5 05/26/2021 11:22 AM    INR 0.9 05/26/2021 11:22 AM       HCG (If Applicable): No results found for: PREGTESTUR, PREGSERUM, HCG, HCGQUANT     ABGs: No results found for: PHART, PO2ART, PCO2ART, HCO3ART, BEART, O2SATART     Type & Screen (If Applicable):  Lab Results  Component Value Date    ABORH AB POSITIVE 05/26/2021    LABANTI POS 05/26/2021       Drug/Infectious Status (If Applicable):  Lab Results   Component Value Date/Time    HEPCAB <0.1 11/06/2014 03:35 PM       COVID-19 Screening (If Applicable): No results found for: COVID19        Anesthesia Evaluation  Patient summary reviewed and Nursing notes reviewed  Airway:  Mallampati: II  TM distance: >3 FB   Neck ROM: full  Mouth opening: > = 3 FB   Dental: normal exam         Pulmonary:Negative Pulmonary ROS breath sounds clear to auscultation                             Cardiovascular:Negative CV ROS        (-) CAD    ECG reviewed  Rhythm: regular  Rate: normal  Echocardiogram reviewed               ROS comment:  Left Ventricle: Normal left ventricular systolic function with a visually estimated EF of 55 - 60%. Left ventricle size is normal. Increased wall thickness. Normal wall motion. Grade I diastolic dysfunction with normal LAP.    Right Ventricle: Right ventricle size is normal. Normal systolic function.    Tricuspid Valve: Mild regurgitation       Neuro/Psych:   (+) TIA            GI/Hepatic/Renal: Neg GI/Hepatic/Renal ROS            Endo/Other:                     Abdominal:             Vascular:   + PE.       Other Findings:             Anesthesia Plan      general and regional     ASA 2           MIPS: Postoperative opioids intended.  Anesthetic plan and risks discussed with patient.      Plan discussed with CRNA.                    Toribio Rim, MD   02/09/2024

## 2024-02-10 NOTE — Anesthesia Post-Procedure Evaluation (Signed)
 Department of Anesthesiology  Postprocedure Note    Patient: Lindsay [name]  MRN: [id number]  Birthdate: Mar 17, 1956  Date of evaluation: 02/10/2024    Procedure Summary       Date: 02/10/24 Room / Location: [facility] MA[facility] OR 10 / [facility] MA[facility] OR    Anesthesia Start: 1238 Anesthesia Stop: 1538    Procedure: RIGHT REVERSE TOTAL SHOULDER ARTHROPLASTY, WITH OPEN BICEPS TENODESIS (GENERAL WITH INTERSCALENE BLOCK) (Right: Shoulder) Diagnosis:       Closed fracture of head of right humerus, initial encounter      (Closed fracture of head of right humerus, initial encounter [D57.708J])    Providers: Patrcia Hacker, MD Responsible Provider: Delvin Sieving, MD    Anesthesia Type: General, Regional ASA Status: 2            Anesthesia Type: General, Regional    Aldrete Phase I: Aldrete Score: 8    Aldrete Phase II:      Anesthesia Post Evaluation    Patient location during evaluation: PACU  Level of consciousness: awake  Airway patency: patent  Nausea & Vomiting: no nausea  Cardiovascular status: hemodynamically stable  Respiratory status: acceptable  Hydration status: stable  Pain management: adequate    No notable events documented.

## 2024-02-10 NOTE — Op Note (Signed)
 Operative Report    Patient Name: Lindsay [name]    Patient Date of Birth: 05-21-1956    [facility] MRN: [id number]    Date of Procedure: 02/10/24    Surgical Location: [facility]    Pre-operative Diagnosis: right displaced proximal humerus fracture    Post-operative Diagnosis: right displaced proximal humerus fracture    Procedure: right reverse total shoulder arthroplasty, right shoulder open biceps tenodesis    Surgeon: Cordella Bertin, MD    Assistants: Elsie Glenn, PA-C    Anesthesia: General    Preoperative IV Antibiotics: Ancef  2g    Findings: comminuted proximal humerus fracture not amendable to open reduction internal fixation    Estimated Blood Loss: 200 mL    Implants: [employer]ker ReUnion RFX Humeral Stem size 9mm, Stryker ReUnion RFX Humeral cup size +56mm, Polyethylene size 36+40mm, Stryker RFX glenoid baseplate and locking screws, Stryker ReUnion Glenosphere 36+7mm    Specimens: None    Complications: None    Disposition: hemodynamically stable to PACU    Condition: stable      Indication for Procedure:  Lindsay [name] presented to my office on an outpatient basis was found to have a displaced right proximal humerus fracture and a nondisplaced glenoid fracture. She tried nonoperative management over a few weeks but there was continued valgus collapse and lateralization of the humeral head.  We discussed continued nonoperative and operative treatment options including open reduction internal fixation and reverse total shoulder arthroplasty.  With its comminution and displacement, I recommended arthroplasty for long-term function and mobility.  The patient to proceed with reverse shoulder arthroplasty.    Procedure in Detail:  The patient was met in the preoperative holding area and the right shoulder was marked. A consent form was signed to proceed forth with surgery. The patient was given a preoperative interscalene block. The patient was brought into the operating room, given general  anesthesia and placed in the beach chair position. The right shoulder was prepped and draped in the usual sterile fashion. A multidisciplinary time-out was performed. Prophylactic antibiotics and tranexamic acid  were dosed. Elsie Glenn, PA-C, was of vital assistance in performing this surgical procedure. He was essential for proper positioning, maintaining a sterile environment, surgical exposure and protection of important structures, implant insertion and timely multi-layered closure of the surgical site.    A deltopectoral approach to the shoulder was taken extending from 5 cm medial to the [facility] joint towards the deltoidpectoral interval. The cephalic vein was identified, dissected medially and preserved throughout the case. I released the subdeltoid adhesions and identified the comminuted proximal humerus fracture.  I identified the biceps tendon which was incarcerated within the fracture site and partially torn. I opened the sheath and used a number 2 Arthrex FiberWire suture to tenodese the biceps into the pectoralis major tendon. I then transversely cut the biceps proximal to the pectoralis tendon. I followed the biceps tendon into the rotator interval and the comminuted fracture fragments.  I excised any loose fragments and the articular surface.  This was kept for bone grafting later.  I tagged the subscapularis anteriorly and the posterior rotator cuff tendon for retraction and later repair.    I then placed retractors into the glenoid and exposed the subscapularis tendon. I released anterior and posterior subscapularis tendon adhesions. The labrum was then removed circumferentially around the glenoid.  The anterior inferior glenoid fracture was nondisplaced and healing in appropriate position.  Next, I used a pin in the center of the glenoid  in order to guide the reamer.  I reamed the glenoid until some bleeding bone was evident. I then placed the base plate and got excellent fixation as it seated. I  then used the locking screw guide to place 4 peripheral locking screws. The trial glenoid sphere was placed over the base plate.    I then brought the proximal humerus out of the wound.  We sequentially reamed the intramedullary canal until appropriate resistance was met.  The trial broach was placed and expanded for fixation.  We then placed a trial humeral cup and reduced the shoulder.  Adequate motion stability was felt.  Next we removed the trial humeral stem and glenosphere.  Sutures were passed through the posterior rotator cuff for around the world stitch fixation as well as tuberosity reduction and passed through the humeral stem.  A drill bit was used laterally in the shaft for an inside then out suture for vertical fixation.    The baseplate was cleansed and the final glenosphere was inserted.  Next we irrigated the humeral canal and a cement restrictor was placed distally.  Cement was prepared on the back table.  Cement was inserted into the humeral shaft and pressurized using my finger.  I then placed the final stem to the calcar height.  We removed the cement around the proximal portion of the stem for a black and tan technique.  Once the cement was hardened, we retrialed the humeral cups.  We determined the ideal stability and tuberosity reduction.  The final polyethylene and humeral cup were inserted.  The shoulder was reduced, and final range of motion revealed forward flexion of 150 degrees, internal rotation to 60 degrees, external rotation to 80 degrees in abduction and external rotation to 50 degrees in neutral position without subluxation or dislocation.  It was stable to range of motion without concern for dislocation.  The around-the-world stitch was passed anteriorly through the subscapularis tendon.  A 3 min Betadine soak was performed and the shoulder was lavaged with irrigation. 1g Vancomycin  powder was applied throughout the wound.  Humeral head autograft was placed around the metaphyseal  portion of the stem. The tuberosity sutures were tied together to reduce them around the stem.  The around the world and posterior cuff suture which were passed through the stem were both secured.  Next I placed the vertical humeral shaft sutures up through the rotator cuff and tied back down distally. The tuberosity repair was stable to range of motion of the shoulder.    The fascia was closed with #1 Vicryl interrupted suture. The skin was closed with 2-0 Vicryl subcutaneously, followed by subcuticular 3-0 Monocryl. Sylke topical adhesive was applied and covered with an occlusive dressing. The surgical count was correct at the end of the procedure. The patient was placed in a postoperative immobilizer. The patient was awakened from anesthesia and brought to the PACU in stable condition.    Postoperative plan:  The right arm will be nonweightbearing, and is ok for pendulums and elbow and wrist motion. We will perform physical therapy on an outpatient basis. The patient will follow up in the office in 10-14 days.      Cordella Bertin, MD    02/10/24

## 2024-02-10 NOTE — Other (Signed)
 02/10/24 1320   Family Communication   Contact Person Relationship to Patient Son-in-law;Daughter-in-law   Contact Person Phone Number Mr. Geraldine Wenona Geraldine) [phone]   Family/Significant Other Update Called   Delivery Origin Nurse   Message Disposition Family present - message delivered   Update Given Yes   Family Communication   Family Update Message Procedure started;Surgeon working

## 2024-02-10 NOTE — Interval H&P Note (Signed)
 Update History & Physical    The patient's History and Physical of February 07, 2024 was reviewed with the patient and I examined the patient. There was no change. The surgical site was confirmed by the patient and me.     Plan: The risks, benefits, expected outcome, and alternative to the recommended procedure have been discussed with the patient. Patient understands and wants to proceed with the procedure.     Electronically signed by Cordella Bertin, MD on 02/10/2024 at 12:14 PM

## 2024-02-11 LAB — EKG 12-LEAD
Atrial Rate: 77 {beats}/min
Diagnosis: NORMAL
P Axis: 30 degrees
P-R Interval: 142 ms
Q-T Interval: 402 ms
QRS Duration: 78 ms
QTc Calculation (Bazett): 454 ms
R Axis: 7 degrees
T Axis: 24 degrees
Ventricular Rate: 77 {beats}/min

## 2024-02-11 LAB — CULTURE, MRSA, SCREENING

## 2024-02-13 LAB — TYPE AND SCREEN
ABO/Rh: AB POS
Antibody Ident: NOT DETECTED
Antibody Screen: POSITIVE
Antigen/Antibody: NEGATIVE
Antigen/Antibody: NEGATIVE
Unit Divison: 0
Unit Divison: 0

## 2024-02-15 ENCOUNTER — Emergency Department: Admit: 2024-02-16 | Payer: MEDICARE | Primary: Internal Medicine

## 2024-02-15 ENCOUNTER — Encounter

## 2024-02-15 DIAGNOSIS — R079 Chest pain, unspecified: Principal | ICD-10-CM

## 2024-02-15 MED ORDER — PREGABALIN 150 MG PO CAPS
150 | ORAL_CAPSULE | Freq: Two times a day (BID) | ORAL | 2 refills | 30.00000 days | Status: DC
Start: 2024-02-15 — End: 2024-04-26

## 2024-02-15 MED ORDER — ATORVASTATIN CALCIUM 20 MG PO TABS
20 | ORAL_TABLET | Freq: Every day | ORAL | 1 refills | 90.00000 days | Status: AC
Start: 2024-02-15 — End: ?

## 2024-02-15 NOTE — ED Notes (Signed)
 Pt to CT

## 2024-02-15 NOTE — Telephone Encounter (Signed)
 Patient called with concerns of swelling after shoulder replacement. Dr. Glynn recommended patient go to ED.

## 2024-02-15 NOTE — ED Triage Notes (Signed)
 Patient arrives c/o right arm pain. States she had a reverse shoulder surgery last Friday and has experienced pain since. States she thinks its tape from her procedure.       States an hour ago she started having chest pain and shortness of breath but doesn't know if its because she was anxious about her arm pain.   States she has had blood clots after two other major surgeries in the past.

## 2024-02-16 ENCOUNTER — Inpatient Hospital Stay
Admit: 2024-02-16 | Discharge: 2024-02-16 | Disposition: A | Discharge status: Home / Self Care | Payer: MEDICARE | Arrived: VH | Attending: Student in an Organized Health Care Education/Training Program

## 2024-02-16 LAB — COMPREHENSIVE METABOLIC PANEL
ALT: 65 U/L — ABNORMAL HIGH (ref 10–35)
AST: 68 U/L — ABNORMAL HIGH (ref 10–35)
Albumin/Globulin Ratio: 0.8 — ABNORMAL LOW (ref 1.1–2.2)
Albumin: 3.3 g/dL — ABNORMAL LOW (ref 3.5–5.2)
Alk Phosphatase: 283 U/L — ABNORMAL HIGH (ref 35–104)
Anion Gap: 13 mmol/L (ref 2–14)
BUN/Creatinine Ratio: 17 (ref 12–20)
BUN: 10 mg/dL (ref 8–23)
CO2: 26 mmol/L (ref 20–29)
Calcium: 9.6 mg/dL (ref 8.8–10.2)
Chloride: 98 mmol/L (ref 98–107)
Creatinine: 0.59 mg/dL — ABNORMAL LOW (ref 0.60–1.00)
Est, Glom Filt Rate: >90 ml/min/1.73m2 (ref >90)
Globulin: 4.1 g/dL — ABNORMAL HIGH (ref 2.0–4.0)
Glucose: 111 mg/dL — ABNORMAL HIGH (ref 65–100)
Potassium: 3.9 mmol/L (ref 3.5–5.1)
Sodium: 138 mmol/L (ref 136–145)
Total Bilirubin: 0.5 mg/dL (ref 0.0–1.2)
Total Protein: 7.4 g/dL (ref 6.4–8.3)

## 2024-02-16 LAB — CBC WITH AUTO DIFFERENTIAL
Basophils %: 0.6 % (ref 0.0–1.0)
Basophils Absolute: 0.04 K/UL (ref 0.00–0.10)
Eosinophils %: 2.9 % (ref 0.0–7.0)
Eosinophils Absolute: 0.19 K/UL (ref 0.00–0.40)
Hematocrit: 27 % — ABNORMAL LOW (ref 35.0–47.0)
Hemoglobin: 8.6 g/dL — ABNORMAL LOW (ref 11.5–16.0)
Immature Granulocytes %: 0.3 % (ref 0.0–0.5)
Immature Granulocytes Absolute: 0.02 K/UL (ref 0.00–0.04)
Lymphocytes %: 25.9 % (ref 12.0–49.0)
Lymphocytes Absolute: 1.72 K/UL (ref 0.80–3.50)
MCH: 26.3 pg (ref 26.0–34.0)
MCHC: 31.9 g/dL (ref 30.0–36.5)
MCV: 82.6 FL (ref 80.0–99.0)
MPV: 9.6 FL (ref 8.9–12.9)
Monocytes %: 8.6 % (ref 5.0–13.0)
Monocytes Absolute: 0.57 K/UL (ref 0.00–1.00)
Neutrophils %: 61.7 % (ref 32.0–75.0)
Neutrophils Absolute: 4.1 K/UL (ref 1.80–8.00)
Nucleated RBCs: 0.3 /100{WBCs} — ABNORMAL HIGH
Platelets: 278 K/uL (ref 150–400)
RBC: 3.27 M/uL — ABNORMAL LOW (ref 3.80–5.20)
RDW: 14.7 % — ABNORMAL HIGH (ref 11.5–14.5)
WBC: 6.6 K/uL (ref 3.6–11.0)
nRBC: 0.02 K/uL — ABNORMAL HIGH (ref 0.00–0.01)

## 2024-02-16 LAB — EKG 12-LEAD
Atrial Rate: 97 {beats}/min
Diagnosis: NORMAL
P Axis: 53 degrees
P-R Interval: 124 ms
Q-T Interval: 350 ms
QRS Duration: 80 ms
QTc Calculation (Bazett): 444 ms
R Axis: 37 degrees
T Axis: 44 degrees
Ventricular Rate: 97 {beats}/min

## 2024-02-16 LAB — EXTRA TUBES HOLD

## 2024-02-16 LAB — D-DIMER, QUANTITATIVE: D-Dimer, Quant: 2.71 mg{FEU}/L — ABNORMAL HIGH (ref 0.00–0.65)

## 2024-02-16 LAB — TROPONIN: Troponin T: 10.3 ng/L (ref 0–14)

## 2024-02-16 MED ORDER — IOPAMIDOL 76 % IV SOLN
76 | Freq: Once | INTRAVENOUS | Status: AC | PRN
Start: 2024-02-16 — End: 2024-02-15
  Administered 2024-02-16: 03:00:00 70 mL via INTRAVENOUS

## 2024-02-16 MED ORDER — FUROSEMIDE 20 MG PO TABS
20 | ORAL_TABLET | Freq: Two times a day (BID) | ORAL | 0 refills | 30.00000 days | Status: DC
Start: 2024-02-16 — End: 2024-04-20

## 2024-02-16 MED FILL — ISOVUE-370 76 % IV SOLN: 76 % | INTRAVENOUS | Qty: 100 | Fill #0

## 2024-02-17 NOTE — Care Coordination-Inpatient (Signed)
 Ambulatory Care Coordination Note     02/17/2024 12:22 PM    Patient outreach attempt by this ACM today to perform care management follow up . ACM was unable to reach the patient by telephone today;   left voice message requesting a return phone call to this ACM.  mychart message sent requesting patient to contact this ACM.     ACM: Deitra KATHEE Hacker, RN    Care Summary Note:   -Per chart review, pt attended ortho f/u 8/5. Ended up having surgery for total right shoulder arthroplasty 8/8, then seen in ER 8/13 for c/o post-op swelling/pain.     Follow Up:   Plan for next ACM outreach in approximately 1 week to complete:  F/u outreach attempt.

## 2024-02-17 NOTE — Care Coordination-Inpatient (Signed)
 Ambulatory Care Coordination Note     02/17/2024 1:18 PM     Patient Current Location:  [place]      ACM contacted the patient by telephone. Verified name and DOB with patient as identifiers.     Patient graduated from the High Risk Care Management program on 02/17/2024.  Patient verbalizes confidence in the ability to self-manage at this time..  Care management goals have been completed. No further Ambulatory Care Manager follow up scheduled.      ACM: Deitra KATHEE Hacker, RN     Care Planning:    Goals Addressed                   This Visit's Progress     COMPLETED: Conditions and Symptoms   On track     I will schedule office visits, as directed by my provider.  I will keep my appointment or reschedule if I have to cancel.  I will notify my provider of any barriers to my plan of care.  I will follow my Zone Management tool to seek urgent or emergent care.  I will notify my provider of any symptoms that indicate a worsening of my condition.    Barriers: overwhelmed by complexity of regimen and pain  Plan for overcoming my barriers: Ongoing support, education, and resources from [facility].  Confidence: 7/10  Anticipated Goal Completion Date: 05/06/24    02/06/2024  -Pt attended ortho f/u appts 7/16 and 7/23. Was told her fracture was displaced, but they would allow some time to see if it would start to heal conservatively.  -Pt expressed frustration over her numerous injuries over the last few years, stating this most recent injury felt like the straw that broke the camels back.  -Pt states she thinks ortho is going to tell her she needs surgery to fix her fracture, but admits she doesn't think she wants to go through with surgery at this time.  -Provided therputic listening and support. Discussed taking things one day at a time.  -Pt to attend ortho f/u tomorrow, 8/5. Will discuss necessary next treatment steps with ortho provider and go from there.  -Pt to cont to wear sling at all times as instructed by ortho.  -Will send ACM  Intro Letter and RCRT for review. CD    02/17/2024  -Unable to reach pt for f/u outreach.  -LVM requesting call back and ACM contact information.   -GIT sent via MyChart. CD    02/17/2024 update:  -Received return call from pt.  -Pt attended ortho f/u 8/5. Ended up having surgery for total right shoulder arthroplasty 8/8.   -Pt ended up contacting PCP on 8/13 for concern for post-op swelling and pain. Was advised to go to ER for eval.   -Also reported chest pain and SOB while in ER.   -Had CT of chest to rule out PE. Prescribed lasix  for the swelling and d/c home.   -Pt states doing much better now. Reports compliance with Lasix  and pain meds. States swelling has gone down. Reports still having decent amount of pain, but manageable with pain meds.   -Confirmed pt has post-op f/u scheduled with ortho and is already participating in [facility].  -Pt denies any additional ACM needs/concerns, verbalized confidence in ability to self-manage health. Completing goals and closing HRCM episode at this time. CD               PCP/Specialist follow up:   Future Appointments  Provider Specialty Dept Phone    03/15/2024 10:00 AM Spencer Isaiah RAMAN, PA Orthopedic Surgery [phone]    04/12/2024 10:40 AM Lindon Baron, MD Orthopedic Surgery [phone]    04/26/2024 1:30 PM [place], Rowe FALCON, APRN - NP Neurology [phone]            Follow Up:   No further Ambulatory Care Management follow-up scheduled at this time.  Patient  has Ambulatory Care Manager's contact information for any further questions, concerns or needs.

## 2024-02-23 ENCOUNTER — Encounter: Payer: MEDICARE | Attending: Physician Assistant | Primary: Internal Medicine

## 2024-02-27 MED ORDER — ROPINIROLE HCL 1 MG PO TABS
1 | ORAL_TABLET | ORAL | 2 refills | 90.00000 days | Status: DC
Start: 2024-02-27 — End: 2024-04-26

## 2024-03-01 ENCOUNTER — Encounter

## 2024-03-01 NOTE — Telephone Encounter (Signed)
 Last seen provider in Feb & due for a 6 month follow up. Please help patient confirm an appt for sept & once the appt is confirmed a refill can be sent to last until the appt.

## 2024-03-01 NOTE — Telephone Encounter (Signed)
 Medication refill    buPROPion  (WELLBUTRIN  XL) 150 MG extended release tablet     [id number] - [place], [place] - [address] - P [phone] - F [phone] [[id number]]

## 2024-03-01 NOTE — Telephone Encounter (Signed)
 Patient has not seen her PCP since Feb & needs to 1st confirm an appt for Sept before a refill can be sent. Please assist with scheduling a med check appt.

## 2024-03-15 ENCOUNTER — Encounter: Payer: MEDICARE | Attending: Physician Assistant | Primary: Internal Medicine

## 2024-03-22 ENCOUNTER — Encounter: Payer: MEDICARE | Attending: Specialist | Primary: Internal Medicine

## 2024-04-12 ENCOUNTER — Encounter: Payer: MEDICARE | Attending: Specialist | Primary: Internal Medicine

## 2024-04-20 ENCOUNTER — Ambulatory Visit: Admit: 2024-04-20 | Payer: MEDICARE | Attending: Internal Medicine | Primary: Internal Medicine

## 2024-04-20 ENCOUNTER — Encounter

## 2024-04-20 VITALS — BP 100/70 | HR 76 | Temp 97.70000°F | Resp 16 | Ht 63.0 in | Wt 183.0 lb

## 2024-04-20 DIAGNOSIS — E785 Hyperlipidemia, unspecified: Principal | ICD-10-CM

## 2024-04-20 LAB — COMPREHENSIVE METABOLIC PANEL
ALT: 16 U/L (ref 10–35)
AST: 21 U/L (ref 10–35)
Albumin/Globulin Ratio: 1.2 (ref 1.1–2.2)
Albumin: 4 g/dL (ref 3.5–5.2)
Alk Phosphatase: 200 U/L — ABNORMAL HIGH (ref 35–104)
Anion Gap: 11 mmol/L (ref 2–14)
BUN/Creatinine Ratio: 24 — ABNORMAL HIGH (ref 12–20)
BUN: 15 mg/dL (ref 8–23)
CO2: 28 mmol/L (ref 20–29)
Calcium: 9.7 mg/dL (ref 8.8–10.2)
Chloride: 104 mmol/L (ref 98–107)
Creatinine: 0.65 mg/dL (ref 0.60–1.00)
Est, Glom Filt Rate: >90 ml/min/1.73m2 (ref >59)
Globulin: 3.3 g/dL (ref 2.0–4.0)
Glucose: 107 mg/dL — ABNORMAL HIGH (ref 65–100)
Potassium: 4.6 mmol/L (ref 3.5–5.1)
Sodium: 143 mmol/L (ref 136–145)
Total Bilirubin: 0.2 mg/dL (ref 0.0–1.2)
Total Protein: 7.3 g/dL (ref 6.4–8.3)

## 2024-04-20 LAB — LIPID PANEL
Chol/HDL Ratio: 3.1 (ref 0.0–5.0)
Cholesterol, Total: 166 mg/dL (ref 0–200)
HDL: 54 mg/dL (ref 40–60)
LDL Cholesterol: 75 mg/dL (ref 0–100)
Triglycerides: 185 mg/dL — ABNORMAL HIGH (ref 0–150)
VLDL Cholesterol Calculated: 37 mg/dL

## 2024-04-20 LAB — CBC
Hematocrit: 35.2 % (ref 35.0–47.0)
Hemoglobin: 10.5 g/dL — ABNORMAL LOW (ref 11.5–16.0)
MCH: 23.9 pg — ABNORMAL LOW (ref 26.0–34.0)
MCHC: 29.8 g/dL — ABNORMAL LOW (ref 30.0–36.5)
MCV: 80.2 FL (ref 80.0–99.0)
MPV: 10.5 FL (ref 8.9–12.9)
Nucleated RBCs: 0 /100{WBCs}
Platelets: 280 K/uL (ref 150–400)
RBC: 4.39 M/uL (ref 3.80–5.20)
RDW: 15.2 % — ABNORMAL HIGH (ref 11.5–14.5)
WBC: 7.6 K/uL (ref 3.6–11.0)
nRBC: 0 K/uL (ref 0.00–0.01)

## 2024-04-20 NOTE — Assessment & Plan Note (Signed)
"  Tolerates atorvastatin  20 mg.  No myalgias.  Due for lipids.    Orders:    Comprehensive Metabolic Panel; Future    Lipid Panel; Future    "

## 2024-04-20 NOTE — Progress Notes (Signed)
 "    Lindsay [name] (DOB:  01/28/1956) is a 68 y.o. female,Established patient, here for evaluation of the following chief complaint(s):  Medication Refill (6 month follow up)         Assessment & Plan  Dyslipidemia, goal LDL below 100  Tolerates atorvastatin  20 mg.  No myalgias.  Due for lipids.    Orders:    Comprehensive Metabolic Panel; Future    Lipid Panel; Future    RLS (restless legs syndrome)  Working with neurology Dr. Travis.  Now on Requip  and Lyrica  150 mg twice daily.  Endorses improvement         Other mixed anxiety disorders  Reports no longer feeling depressed.  Partially due to the birth of her 2 new grandchildren.  Currently on bupropion  150 mg and Valium  5 mg twice daily.  Has been seeing Lindsay [name].  Strongly encouraged to continue with that support.  Discussed continue to have medications managed by her psychiatric NP.         Normocytic anemia  Hemoglobin postop 8.6 after shoulder surgery.  Suspect some blood loss with procedure.  Recheck hemoglobin.  Currently no evidence of active bleeding.  Currently on baby aspirin  for DVT prophylaxis    Orders:    CBC; Future      No follow-ups on file.       Subjective   Medication Refill    Seen for med check.  She did have a fall and had a fracture of right shoulder.  Underwent shoulder replacement in August.  Feels pleased with her recovery.  Participating in physical therapy and notes that her range of motion is gradually improving.  Has not needed significant pain meds.  Still has limitations in AB adduction.    Has seen . Lindsay [name] for mental health support.  Lindsay [name] wanted to take over on managing Lindsay [name]'s meds and Lindsay [name] was reluctant.  I am strongly encouraging her to do so.  Discussed today that she can provide more support than I can and it makes sense for her to manage the Valium  and the bupropion .  She notes she is not feeling depressed because she has 2 new grandchildren.  She feels generally more hopeful.    Chronic pain seems improved  currently on the Lyrica .  Complains of numbness in her right little toe over the last month.  Back is intermittently problematic.  No recent weakness in the legs.  Review of Systems       Objective   Physical Exam  Constitutional:       Appearance: Normal appearance.   HENT:      Head: Normocephalic and atraumatic.   Cardiovascular:      Rate and Rhythm: Normal rate and regular rhythm.   Pulmonary:      Effort: Pulmonary effort is normal.      Breath sounds: Normal breath sounds.   Musculoskeletal:      Right lower leg: No edema.      Left lower leg: No edema.      Comments: Right shoulder limited rom   Skin:     Findings: No rash.      Comments: Skin of feet intact and no lesions  Good rom toes and no weakness   Neurological:      General: No focal deficit present.      Mental Status: She is alert and oriented to person, place, and time.   Psychiatric:         Behavior:  Behavior normal.                An electronic signature was used to authenticate this note.    --Dorien Edis, MD   "

## 2024-04-20 NOTE — Assessment & Plan Note (Signed)
"  Working with neurology Dr. Travis.  Now on Requip  and Lyrica  150 mg twice daily.  Endorses improvement         "

## 2024-04-26 ENCOUNTER — Ambulatory Visit: Admit: 2024-04-26 | Payer: MEDICARE | Primary: Internal Medicine

## 2024-04-26 VITALS — BP 112/78 | HR 88 | Resp 14 | Ht 63.0 in | Wt 183.0 lb

## 2024-04-26 DIAGNOSIS — G2581 Restless legs syndrome: Secondary | ICD-10-CM

## 2024-04-26 MED ORDER — ROPINIROLE HCL 1 MG PO TABS
1 | ORAL_TABLET | Freq: Every day | ORAL | 2 refills | 90.00000 days | Status: AC
Start: 2024-04-26 — End: ?

## 2024-04-26 MED ORDER — PREGABALIN 150 MG PO CAPS
150 | ORAL_CAPSULE | Freq: Two times a day (BID) | ORAL | 2 refills | 30.00000 days | Status: AC
Start: 2024-04-26 — End: 2025-01-21

## 2024-04-26 NOTE — Patient Instructions (Signed)
"  Psychiatry Options    [facility]  Address: [phone] [place], [place] [address]  Phone: [phone]    [facility]   Address: [address], [place], [place] [address]  Phone: [phone]    [facility] (In person/virtual)   [facility] Location  Address: [address] [place], [place] [address]  Phone: [phone]    [facility] Location  Address: [address] [place], [place] [address]  Phone: [phone]    [place] Location  Address: [address] [place], [place] [address]  Phone: [phone]    [place] Location  Address: [address] [place], [place] [address]  Phone: [phone]    [facility] Location  Address: [address], [place], [place] [address]  Phone: [phone]    [facility]  Address: [address], [place], [place] [address]  Phone: [phone]    [facility]  [facility]  [address], [place]  Phone: [phone]    [facility]  [address]  [place], [place]  Phone: [phone]    [facility]  Address: [address] [place], [place] [address]  Phone: [phone]    [facility]  Address: [address] [place], [place] [address]  Phone: [phone]    [facility]  Address: [address] [place], [place] [address]  Phone: [phone]    [facility] [place]  Address: [address], [place], [place] [address]  Phone: [phone]    [facility], [facility]  Address: [address] [place], [place] [address]  Phone: [phone]    "

## 2024-04-26 NOTE — Progress Notes (Signed)
 "Neurology Clinic Follow up Note    Patient ID:   Lindsay [name]  [birth year]  68 y.o. female  [id number]      Chief Complaint   Patient presents with    Follow-up     Follow up   RLS    RLS has been the same  Lyrica  150 mg BID  rOPINIRole  (REQUIP ) 1 MG    Pending L-spine surgery - put off due to fall with broken shoulder back in 01/2024       Last Appointment With Me:    None, last seen by Dr Travis 01/11/2023   Pt is a 67y.o. right handed female with radiographic changes c/w MS, but no clinical symptoms, stable MRI's since 2011, the last MRI brain 02/17/17.      Additionally, has RLS, with an ill-defined irritating sensation in her legs with urge to move her legs beginning around 6PM and keeping her awake and inhibiting her activities, though question if there may be some component of psych issues/akasthesia contributing to her need to walk.   Currently doing well on pregabalin  100 mg twice daily and Requip  4mg  daily.   She did not do well on gabapentin and Horizant  was quite effective but was not covered     Deferring any additional brain imaging as it is not going to change management.  If she develops any clinical symptoms, she will let me know     Interval History:   Doing well from RLS standpoint on current medications. Takes Pregabalin  and Requip  at 1pm and 6pm and denies functional limitations from RLS. No new symptoms. Has fallen few times since she was last seen and broke her left shoulder. She had a root canal and had numbing medication administered which caused her to fall in the doctors office avoiding an employee who was standing in the hall.     Past Medical History:   Diagnosis Date    Abnormal brain scan     Anxiety disorder     Arthritis     Chronic pain     Clavicle fracture     Closed fracture of head of right humerus, initial encounter     Depression     DVT (deep venous thrombosis) (HCC)     Falls frequently     Fracture of left ankle     Headache(784.0) 2011    History of blood transfusion 2016     Ill-defined condition 09/15/2015    FX R HAND    Multiple sclerosis     Was told that brain scan revealed M.S. But since I didn't have an history or symptopms related to M.S. , they would just monitor me    Neurocutaneous syndrome (HCC)     Obesity Mar. '23 - Aug. '24    Other and unspecified hyperlipidemia 12/27/2012    PE (pulmonary embolism) 12/2012    RLS (restless legs syndrome) 03/16/2016    Dr Ronal Cornish    TIA (transient ischemic attack) 03/2016    Right facial droop, numbness in lip    Torn rotator cuff     right     No Known Allergies      Current Outpatient Medications   Medication Sig    omeprazole  (PRILOSEC) 10 MG delayed release capsule Take 1 capsule by mouth daily    diazePAM  (VALIUM ) 5 MG tablet Take 1 tablet by mouth in the morning and at bedtime. Max Daily Amount: 10 mg    rOPINIRole  (REQUIP ) 1 MG tablet  TAKE 1 TABLET AT 1-2PM AND TAKE 3 TABLETS BY MOUTH 1 TO 3 HOURS BEFORE BEDTIME EVERYDAY    atorvastatin  (LIPITOR) 20 MG tablet TAKE 1 TABLET BY MOUTH EVERY DAY    pregabalin  (LYRICA ) 150 MG capsule Take 1 capsule by mouth 2 times daily for 90 days. Max Daily Amount: 300 mg    aspirin  81 MG chewable tablet Take 1 tablet by mouth in the morning and at bedtime    buPROPion  (WELLBUTRIN  XL) 150 MG extended release tablet TAKE 1 TABLET BY MOUTH EVERY DAY IN THE MORNING    acetaminophen  (TYLENOL ) 325 MG tablet Take by mouth every 4 hours as needed    vitamin D3 (CHOLECALCIFEROL) 125 MCG (5000 UT) TABS tablet TAKE 1 TABLET BY MOUTH DAILY     No current facility-administered medications for this visit.       Review of Systems   Musculoskeletal:  Positive for arthralgias, back pain and gait problem.       Neurological Exam  Mental Status  Awake, alert and oriented to person, place and time. Speech is normal. Language is fluent with no aphasia. Attention and concentration are normal.    Cranial Nerves  CN II: Visual acuity is normal. Visual fields full to confrontation.  CN III, IV, VI: Extraocular movements  intact bilaterally. Normal lids and orbits bilaterally. Pupils equal round and reactive to light bilaterally.  CN V: Facial sensation is normal.  CN VII: Full and symmetric facial movement.  CN VIII: Hearing is normal.  CN IX, X: Palate elevates symmetrically. Normal gag reflex.  CN XI: Shoulder shrug strength is normal.  CN XII: Tongue midline without atrophy or fasciculations.    Motor  Normal muscle bulk throughout. Strength is 5/5 throughout all four extremities.    Sensory  Light touch is normal in upper and lower extremities.     Coordination  Right: Finger-to-nose normal.Left: Finger-to-nose normal.    Gait  Casual gait is normal including stance, stride, and arm swing.      There were no vitals taken for this visit.      CT Results (maximum last 3):  CT Result (most recent):      MRI Results (maximum last 3):  MRI Result (most recent):  MRI LUMBAR SPINE WO CONTRAST 06/18/2023    Narrative  EXAM: MRI LUMBAR SPINE WO CONTRAST    INDICATION: Arthrodesis status; Lumbago with sciatica, right side; Other chronic  pain. Arthrodesis status / With STIR images    COMPARISON: None    TECHNIQUE: MR imaging of the lumbar spine was performed using the following  sequences: sagittal T1, T2, STIR;  axial T1, T2.    CONTRAST:  None.    FINDINGS:    There is a posterior fusion including T10, T11 L1 and L2, bridging the healed  burst fracture of T12. A moderate amount of blooming artifact is present from  the posterior fusion but the screws appear to be centrally located as best  assessed by MRI.    There is normal alignment of the lumbar spine. Marrow signal is normal. There is  a Schmorl's node resulting in a small concavity of the superior endplate of L4.    The conus medullaris terminates at L1. Signal and caliber of the distal spinal  cord are within normal limits.    The paraspinal soft tissues are within normal limits.    Lower thoracic spine: T11-12: Mild bilateral neural foraminal stenosis and  central stenosis at  about 8.7 mm.SABRA  L1-L2: No herniation or stenosis.    L2-L3: No herniation or stenosis.    L3-L4: No herniation or stenosis.    L4-L5: No herniation or stenosis.    L5-S1: No herniation or stenosis.    Impression  Posterior fusion from T10-L2 without hardware failure  Healed T12 burst fracture with mild central stenosis    Electronically signed by Devere Cal      MRI LUMBAR SPINE WO CONTRAST 12/11/2019    Narrative  EXAM: MRI THORAC SPINE WO CONT, MRI LUMB SPINE WO CONT    INDICATION: t12 compression fx on 6/3, r/o cord compression.    COMPARISON: Lumbar spine CT dated 12/06/2019    TECHNIQUE: MR imaging of the thoracic and lumbar spine was performed using the  following sequences: sagittal T1, T2, stir; axial T1, T2. Additional coronal T2  images were obtained through the lumbar spine    CONTRAST: None.    FINDINGS:    Comparison prior study, there is increased loss of height of the T12 vertebral  body with increased bony retropulsion. Superior endplate compression fracture of  L4 is also noted which is likely chronic. There is moderate severe canal  stenosis at T12 with moderate bilateral foraminal narrowing. Possible mild  compression on the spinal cord. No definite abnormal signal is identified in the  conus.    The remaining thoracic spine levels are unremarkable.    T12-L1 demonstrates minimal disc bulge without canal stenosis or foraminal  narrowing.    L1-2 demonstrates no evidence of canal stenosis or foraminal narrowing.    At L2-3, there is mild facet hypertrophy without canal stenosis or foraminal  narrowing.    At L3-4, there is a mild concentric disc bulge and facet hypertrophy with  minimal canal stenosis and minimal bilateral foraminal narrowing.    L4-5 there is severe facet hypertrophy and ligament flavum hypertrophy as well  with a mild disc bulge causing mild canal stenosis. No significant foraminal  narrowing.    L5-S1, there is concentric bulge with osteophyte formation and a probable  small  central disc extrusion with cranial migration. Overall canal stenosis is  minimal. The neural foramen are patent.    Incidental soft tissue imaging is unremarkable    Impression  1.  Increase loss of height at T12 with increased bony retropulsion and moderate  severe canal stenosis. Possible compression on the cord although no abnormal  cord signal is identified.    2.  Other degenerative changes in the lumbar spine as described.    3.  Mild superior endplate compression deformity of L4 with a chronic      MRI THORACIC SPINE WO CONTRAST 12/11/2019    Narrative  EXAM: MRI THORAC SPINE WO CONT, MRI LUMB SPINE WO CONT    INDICATION: t12 compression fx on 6/3, r/o cord compression.    COMPARISON: Lumbar spine CT dated 12/06/2019    TECHNIQUE: MR imaging of the thoracic and lumbar spine was performed using the  following sequences: sagittal T1, T2, stir; axial T1, T2. Additional coronal T2  images were obtained through the lumbar spine    CONTRAST: None.    FINDINGS:    Comparison prior study, there is increased loss of height of the T12 vertebral  body with increased bony retropulsion. Superior endplate compression fracture of  L4 is also noted which is likely chronic. There is moderate severe canal  stenosis at T12 with moderate bilateral foraminal narrowing. Possible mild  compression on the spinal cord. No definite  abnormal signal is identified in the  conus.    The remaining thoracic spine levels are unremarkable.    T12-L1 demonstrates minimal disc bulge without canal stenosis or foraminal  narrowing.    L1-2 demonstrates no evidence of canal stenosis or foraminal narrowing.    At L2-3, there is mild facet hypertrophy without canal stenosis or foraminal  narrowing.    At L3-4, there is a mild concentric disc bulge and facet hypertrophy with  minimal canal stenosis and minimal bilateral foraminal narrowing.    L4-5 there is severe facet hypertrophy and ligament flavum hypertrophy as well  with a mild disc bulge  causing mild canal stenosis. No significant foraminal  narrowing.    L5-S1, there is concentric bulge with osteophyte formation and a probable small  central disc extrusion with cranial migration. Overall canal stenosis is  minimal. The neural foramen are patent.    Incidental soft tissue imaging is unremarkable    Impression  1.  Increase loss of height at T12 with increased bony retropulsion and moderate  severe canal stenosis. Possible compression on the cord although no abnormal  cord signal is identified.    2.  Other degenerative changes in the lumbar spine as described.    3.  Mild superior endplate compression deformity of L4 with a chronic            Assessment  Lindsay [name] was seen today for follow-up.    Diagnoses and all orders for this visit:    RLS (restless legs syndrome)    Abnormal brain scan    Status post lumbar spinal fusion      68 year old female with h/o depression and anxiety, chronic pain, TIA on ASA, DVT, PE, frequent falls, obesity, here for a follow up. She has a history of abnormal MRI brain that is c/w multiple sclerosis that has been stable on imaging since 2011 and pt has no clinical symptoms. Deferring repeat imaging as per Dr Travis, it would not change our approach. 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 She is here for a follow up regarding symptoms of RLS which are well controlled w/ current dose of Pregabalin , Requip . She has had a fall over the summer which was due to numbing medication administered to her after a dental procedure and avoiding a staff member in the hallway while walking early after root canal. She is in PT currently for L shoulder injury. She denies new focal neurological deficits, no new concerns. Exam is consistent w/ previous exams w/ Dr Travis. Pregabalin , Requip  refilled today.     Plan:  Continue  Pregabalin  150 mg BID and Requip  4 mg qd    I spent 30 minutes of time today reviewing the medical record and notes, imaging, examining the patient, and face-to-face time, patient/family teaching and medication side effects, and time spent completing documentation. Time excludes separately billed procedures.       No follow-ups on file.     Rossie Bretado F Marybeth Dandy, APRN - NP      "

## 2024-05-07 NOTE — Telephone Encounter (Signed)
"  Left patient voicemail to call office regarding scheduling an appt  "

## 2024-05-07 NOTE — Telephone Encounter (Signed)
"-----   Message from Jon RAMAN sent at 05/07/2024 11:29 AM EST -----  Regarding: Specialist Appointment Request - Established  Specialist Appointment Request - Established    What Specialty? [facility] Speciality: Orthopedics     Type of Appointment: Appointment Type: Follow-Up    Reason for appointment?  F/u appt for low back pain sooner than December    Scheduling Preference per Patient: [facility]      Additional Information: Pt was previously scheduled for sx last August pain is getting worse  --------------------------------------------------------------------------------------------------------------------------    Relationship to Patient: Self  Call Back Information: OK to leave message on voicemail  Preferred Call Back Number: [phone]  "

## 2024-05-10 NOTE — Telephone Encounter (Signed)
"  Patient calls to report following a recent reverse shoulder repair her pain started to intensified.Her right sided pain is now left sided pain. I have advised that she move forward with surgery. She states she is not yet cleared ortho and her PCP told her she would not recommend surgery at this time. Would you like to send her to pain management or have a visit since we have not seen her in a year.   "

## 2024-05-10 NOTE — Telephone Encounter (Signed)
 LMOM for patient to return call.

## 2024-05-10 NOTE — Telephone Encounter (Signed)
"-----   Message from [place] F sent at 05/10/2024  8:31 AM EST -----  Regarding: Specialty Escalation To Practice  Specialty Escalation To Practice      Type of Escalation: Escalation Options: Red Flag Symptom  --------------------------------------------------------------------------------------------------------------------------    Information for Provider:  Patient is looking for appointment for: Symptom Severe pain     Reasons for Message: Other Patient is requesting to speak with Van's team     Additional Information:Info: Patient stated they have already left messages, have not heard back  --------------------------------------------------------------------------------------------------------------------------    Relationship to Patient: Self    Call Back Info: OK to leave message on voicemail  Preferred Call Back Number:  [phone]  "

## 2024-06-01 ENCOUNTER — Telehealth

## 2024-06-01 MED ORDER — BUPROPION HCL ER (XL) 150 MG PO TB24
150 | ORAL_TABLET | Freq: Every morning | ORAL | 0 refills | 30.00000 days | Status: DC
Start: 2024-06-01 — End: 2024-06-25

## 2024-06-01 NOTE — Telephone Encounter (Signed)
"  Patient is calling wanting to know if Dr.Glynn could refill the medication down below. Patient states Dr.Meyers prescribes that medication for her but never called it in last week and that office is closed today. Patient states she can't go two days without medications. Patient is wanting at least 3 pills to get her through the weekend until Monday. Please advise        buPROPion  (WELLBUTRIN  XL) 150 MG extended release tablet    [id number] - [place], [place] - [address] - P [phone] - F [phone]  "

## 2024-06-18 NOTE — Telephone Encounter (Signed)
"  Called pt to confirm 06/19/24 appt cancellation and to confirm 10/03/23 appt , no answer lm for pt to confirm new appt.  "

## 2024-06-19 ENCOUNTER — Encounter: Payer: MEDICARE | Primary: Internal Medicine

## 2024-06-24 ENCOUNTER — Encounter

## 2024-06-25 MED ORDER — BUPROPION HCL ER (XL) 150 MG PO TB24
150 | ORAL_TABLET | Freq: Every morning | ORAL | 0 refills | 90.00000 days | Status: DC
Start: 2024-06-25 — End: 2024-07-23

## 2024-07-19 ENCOUNTER — Encounter

## 2024-07-23 ENCOUNTER — Encounter

## 2024-07-23 MED ORDER — BUPROPION HCL ER (XL) 150 MG PO TB24
150 | ORAL_TABLET | Freq: Every morning | ORAL | 0 refills | 90.00000 days | Status: AC
Start: 2024-07-23 — End: ?
